# Patient Record
Sex: Male | Born: 1950 | Race: White | Hispanic: No | Marital: Married | Smoking: Current every day smoker
Health system: Southern US, Community
[De-identification: ages and names within clinical notes are randomized; demographics above are authoritative.]

## PROBLEM LIST (undated history)

## (undated) DIAGNOSIS — E119 Type 2 diabetes mellitus without complications: Secondary | ICD-10-CM

## (undated) DIAGNOSIS — I96 Gangrene, not elsewhere classified: Secondary | ICD-10-CM

## (undated) DIAGNOSIS — J449 Chronic obstructive pulmonary disease, unspecified: Secondary | ICD-10-CM

## (undated) DIAGNOSIS — K219 Gastro-esophageal reflux disease without esophagitis: Secondary | ICD-10-CM

## (undated) DIAGNOSIS — I10 Essential (primary) hypertension: Secondary | ICD-10-CM

## (undated) DIAGNOSIS — E785 Hyperlipidemia, unspecified: Secondary | ICD-10-CM

## (undated) HISTORY — DX: Essential (primary) hypertension: I10

## (undated) HISTORY — DX: Hyperlipidemia, unspecified: E78.5

## (undated) HISTORY — DX: Chronic obstructive pulmonary disease, unspecified: J44.9

## (undated) HISTORY — PX: OTHER SURGICAL HISTORY: SHX169

## (undated) HISTORY — DX: Gastro-esophageal reflux disease without esophagitis: K21.9

## (undated) HISTORY — DX: Type 2 diabetes mellitus without complications: E11.9

---

## 2012-03-22 DIAGNOSIS — E559 Vitamin D deficiency, unspecified: Secondary | ICD-10-CM | POA: Diagnosis not present

## 2012-03-22 DIAGNOSIS — K219 Gastro-esophageal reflux disease without esophagitis: Secondary | ICD-10-CM | POA: Diagnosis not present

## 2012-03-22 DIAGNOSIS — R5381 Other malaise: Secondary | ICD-10-CM | POA: Diagnosis not present

## 2012-03-22 DIAGNOSIS — R5383 Other fatigue: Secondary | ICD-10-CM | POA: Diagnosis not present

## 2012-03-22 DIAGNOSIS — Z125 Encounter for screening for malignant neoplasm of prostate: Secondary | ICD-10-CM | POA: Diagnosis not present

## 2012-03-22 DIAGNOSIS — E785 Hyperlipidemia, unspecified: Secondary | ICD-10-CM | POA: Diagnosis not present

## 2012-04-27 DIAGNOSIS — H40039 Anatomical narrow angle, unspecified eye: Secondary | ICD-10-CM | POA: Diagnosis not present

## 2012-04-27 DIAGNOSIS — H43819 Vitreous degeneration, unspecified eye: Secondary | ICD-10-CM | POA: Diagnosis not present

## 2012-05-17 DIAGNOSIS — H25049 Posterior subcapsular polar age-related cataract, unspecified eye: Secondary | ICD-10-CM | POA: Diagnosis not present

## 2012-06-21 DIAGNOSIS — I10 Essential (primary) hypertension: Secondary | ICD-10-CM | POA: Diagnosis not present

## 2012-06-21 DIAGNOSIS — K219 Gastro-esophageal reflux disease without esophagitis: Secondary | ICD-10-CM | POA: Diagnosis not present

## 2012-06-21 DIAGNOSIS — E559 Vitamin D deficiency, unspecified: Secondary | ICD-10-CM | POA: Diagnosis not present

## 2012-06-21 DIAGNOSIS — R5381 Other malaise: Secondary | ICD-10-CM | POA: Diagnosis not present

## 2012-06-21 DIAGNOSIS — E785 Hyperlipidemia, unspecified: Secondary | ICD-10-CM | POA: Diagnosis not present

## 2012-08-31 DIAGNOSIS — Z23 Encounter for immunization: Secondary | ICD-10-CM | POA: Diagnosis not present

## 2012-09-20 DIAGNOSIS — E119 Type 2 diabetes mellitus without complications: Secondary | ICD-10-CM | POA: Diagnosis not present

## 2012-09-20 DIAGNOSIS — K219 Gastro-esophageal reflux disease without esophagitis: Secondary | ICD-10-CM | POA: Diagnosis not present

## 2012-09-20 DIAGNOSIS — E785 Hyperlipidemia, unspecified: Secondary | ICD-10-CM | POA: Diagnosis not present

## 2012-09-20 DIAGNOSIS — E559 Vitamin D deficiency, unspecified: Secondary | ICD-10-CM | POA: Diagnosis not present

## 2012-09-20 DIAGNOSIS — J449 Chronic obstructive pulmonary disease, unspecified: Secondary | ICD-10-CM | POA: Diagnosis not present

## 2012-12-01 IMAGING — CR DG FOOT COMPLETE 3+V*L*
3 series · 3 of 3 positions shown · non-contrast
Comparison: None.

CLINICAL DATA: Fall.  Heel pain.

LEFT FOOT - COMPLETE 3+ VIEW

[view not recorded (1 of 3)]
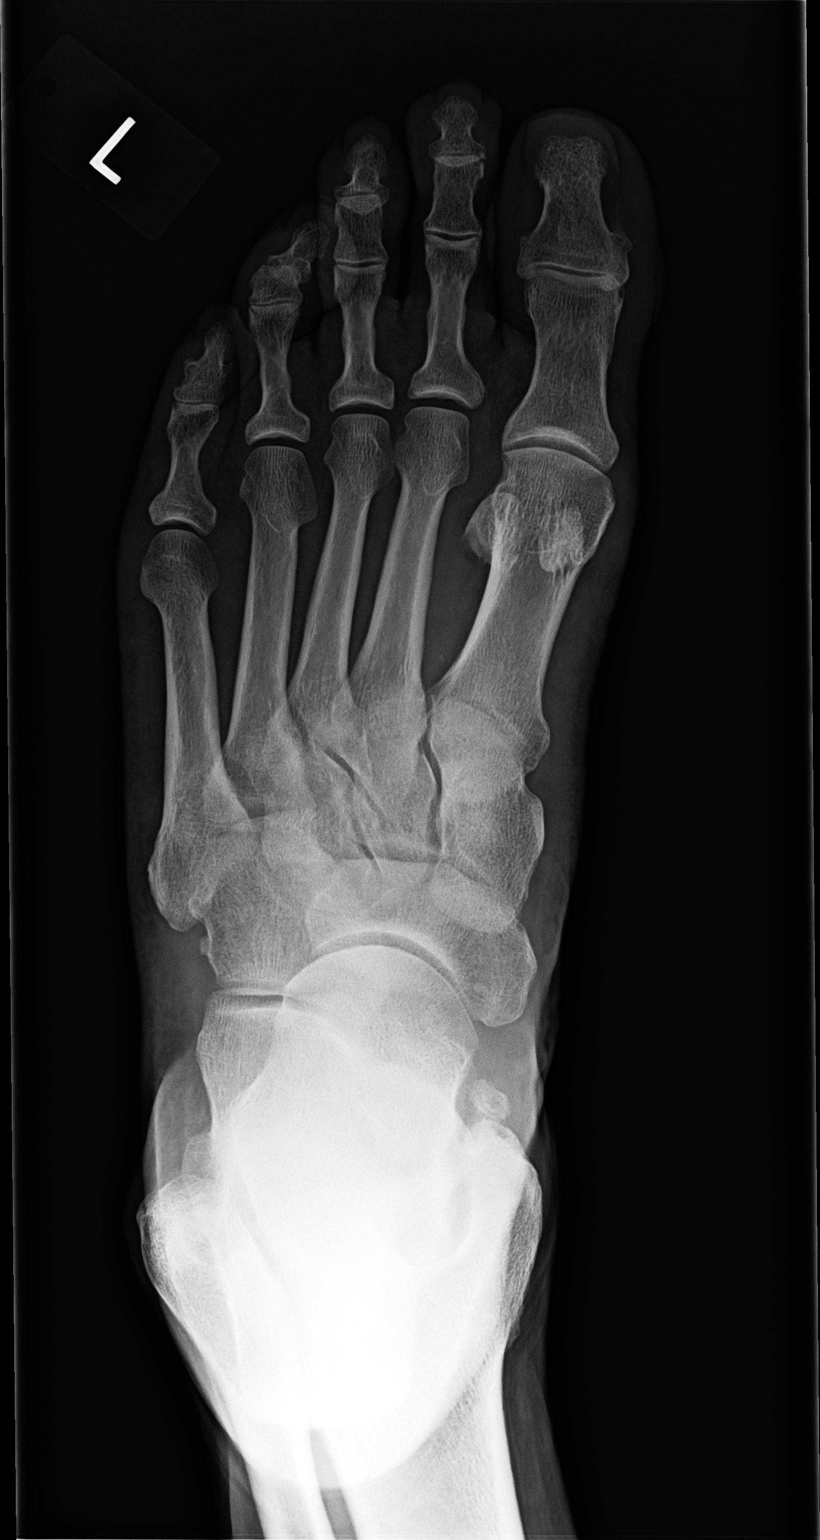

[view not recorded (2 of 3)]
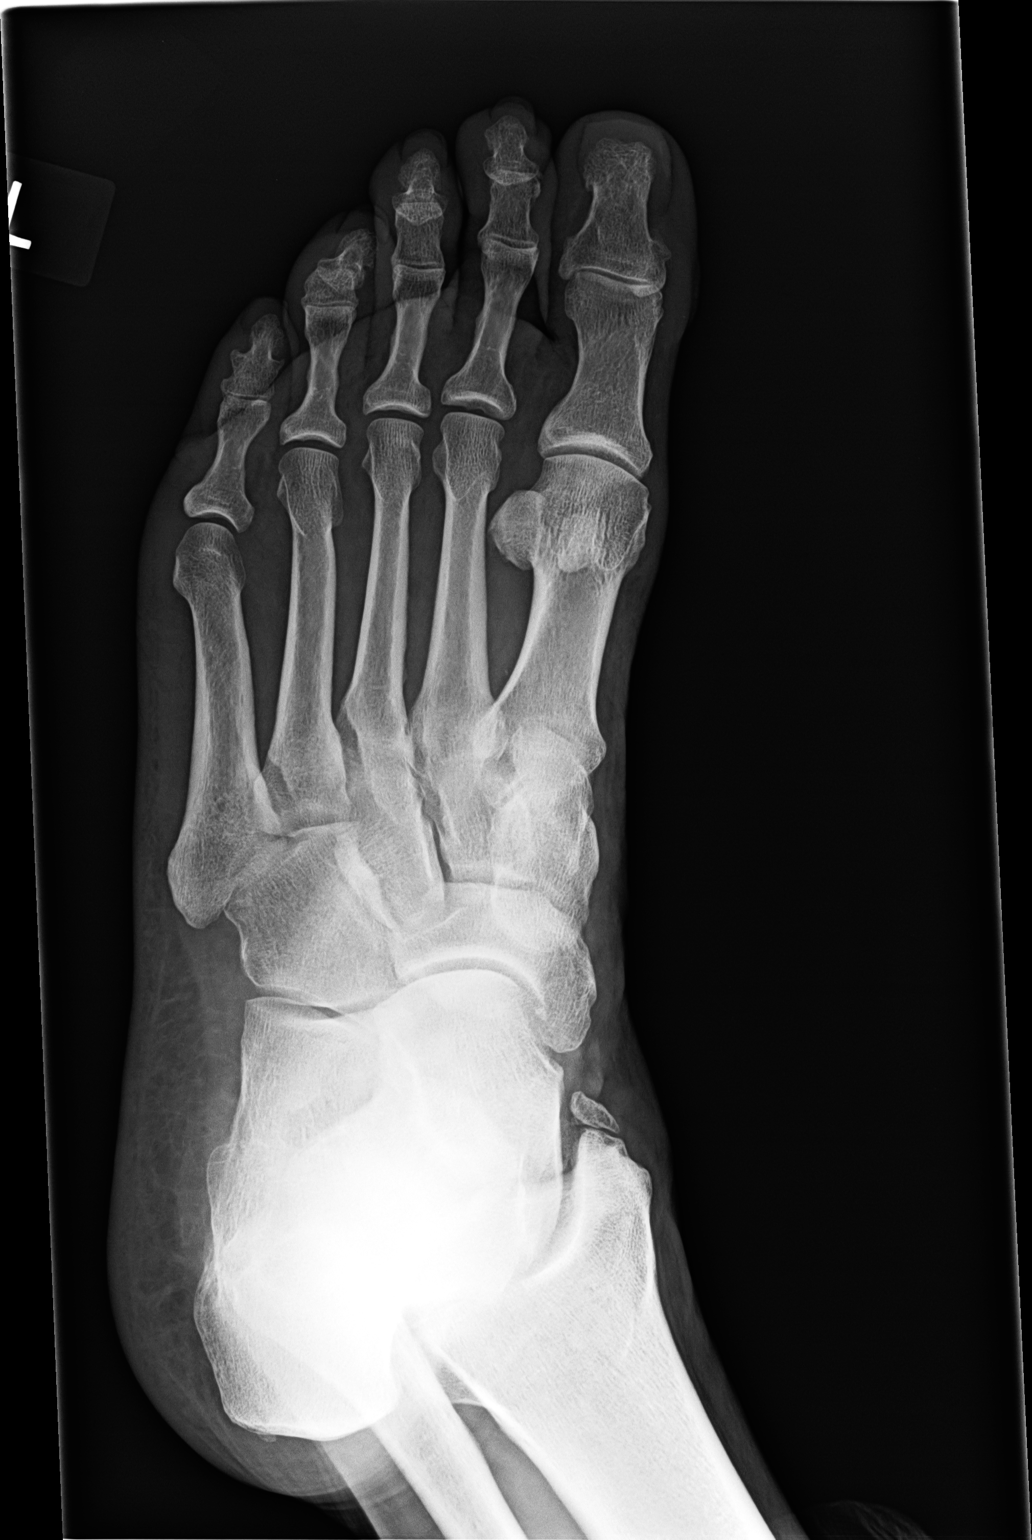

[view not recorded (3 of 3)]
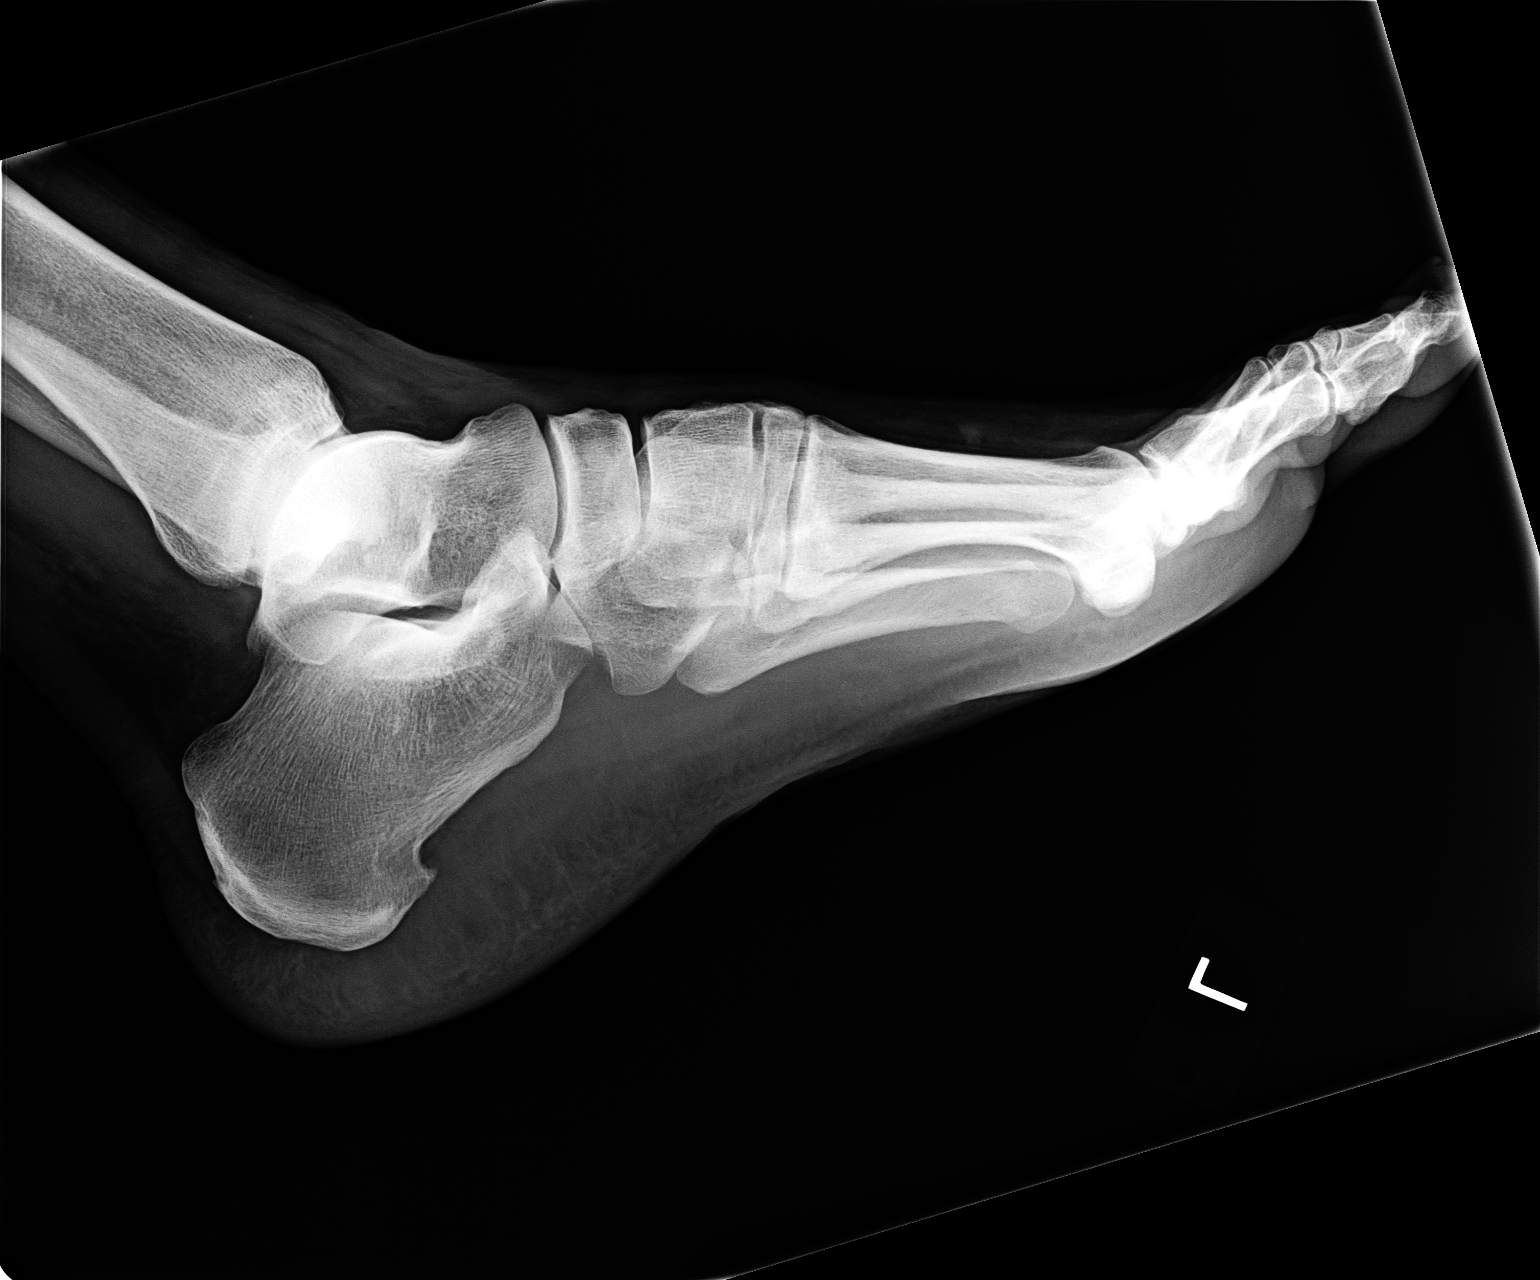

[3 of 3 positions shown; findings below may reference images not displayed]

FINDINGS: There is no evidence for an acute fracture.  Bony
alignment is anatomic. No worrisome lytic or sclerotic osseous
lesion.
IMPRESSION: No acute bony findings.

## 2012-12-31 DIAGNOSIS — M79609 Pain in unspecified limb: Secondary | ICD-10-CM | POA: Diagnosis not present

## 2013-01-11 DIAGNOSIS — I10 Essential (primary) hypertension: Secondary | ICD-10-CM | POA: Diagnosis not present

## 2013-01-11 DIAGNOSIS — R7989 Other specified abnormal findings of blood chemistry: Secondary | ICD-10-CM | POA: Diagnosis not present

## 2013-01-11 DIAGNOSIS — E785 Hyperlipidemia, unspecified: Secondary | ICD-10-CM | POA: Diagnosis not present

## 2013-01-11 DIAGNOSIS — E559 Vitamin D deficiency, unspecified: Secondary | ICD-10-CM | POA: Diagnosis not present

## 2013-01-11 DIAGNOSIS — R0989 Other specified symptoms and signs involving the circulatory and respiratory systems: Secondary | ICD-10-CM | POA: Diagnosis not present

## 2013-01-11 DIAGNOSIS — E119 Type 2 diabetes mellitus without complications: Secondary | ICD-10-CM | POA: Diagnosis not present

## 2013-01-14 ENCOUNTER — Other Ambulatory Visit (HOSPITAL_COMMUNITY): Payer: Self-pay | Admitting: Family Medicine

## 2013-01-14 DIAGNOSIS — R0989 Other specified symptoms and signs involving the circulatory and respiratory systems: Secondary | ICD-10-CM

## 2013-01-15 DIAGNOSIS — M722 Plantar fascial fibromatosis: Secondary | ICD-10-CM | POA: Diagnosis not present

## 2013-01-15 DIAGNOSIS — M79609 Pain in unspecified limb: Secondary | ICD-10-CM | POA: Diagnosis not present

## 2013-01-17 ENCOUNTER — Ambulatory Visit (HOSPITAL_COMMUNITY): Payer: Medicare Other

## 2013-01-24 ENCOUNTER — Ambulatory Visit (HOSPITAL_COMMUNITY)
Admission: RE | Admit: 2013-01-24 | Discharge: 2013-01-24 | Disposition: A | Discharge status: Home / Self Care | Payer: Medicare Other | Source: Ambulatory Visit | Attending: Family Medicine | Admitting: Family Medicine

## 2013-01-24 DIAGNOSIS — I6529 Occlusion and stenosis of unspecified carotid artery: Secondary | ICD-10-CM | POA: Diagnosis not present

## 2013-01-24 DIAGNOSIS — I658 Occlusion and stenosis of other precerebral arteries: Secondary | ICD-10-CM | POA: Diagnosis not present

## 2013-01-24 DIAGNOSIS — I10 Essential (primary) hypertension: Secondary | ICD-10-CM | POA: Insufficient documentation

## 2013-01-24 DIAGNOSIS — R0989 Other specified symptoms and signs involving the circulatory and respiratory systems: Secondary | ICD-10-CM | POA: Insufficient documentation

## 2013-01-24 IMAGING — US US CAROTID DUPLEX BILAT
1 series · 13 of 24 positions shown · non-contrast
Comparison: None.

CLINICAL DATA: Right-sided carotid bruit, history of hypertension

BILATERAL CAROTID DUPLEX ULTRASOUND
TECHNIQUE: Gray scale imaging, color Doppler and duplex ultrasound
was performed of bilateral carotid and vertebral arteries in the
neck.

[Series 1: us carotid duplex bilat · 0.05mm/px · 13 of 74 slices shown]
[im 1/74]
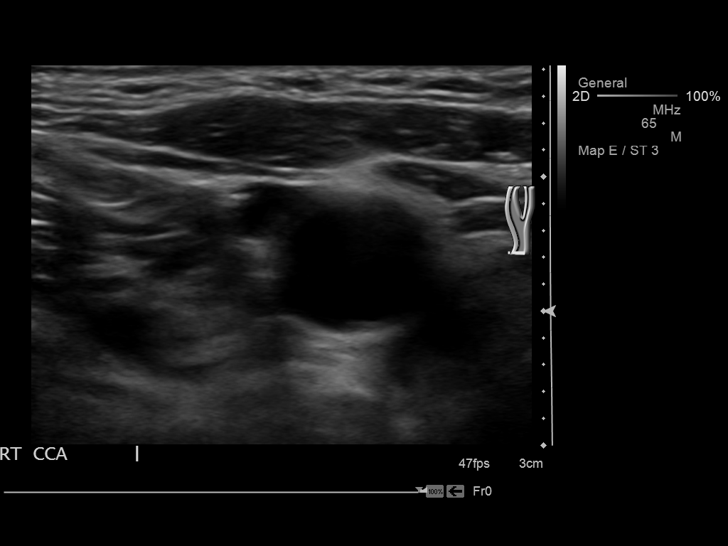
[im 7/74]
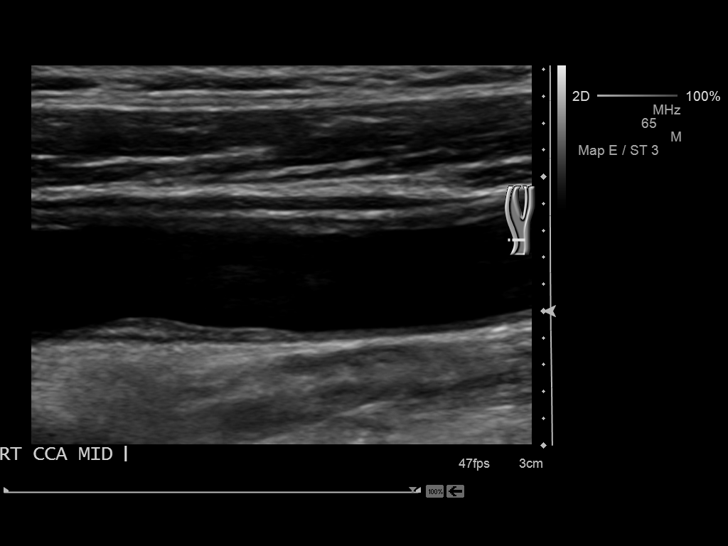
[im 13/74]
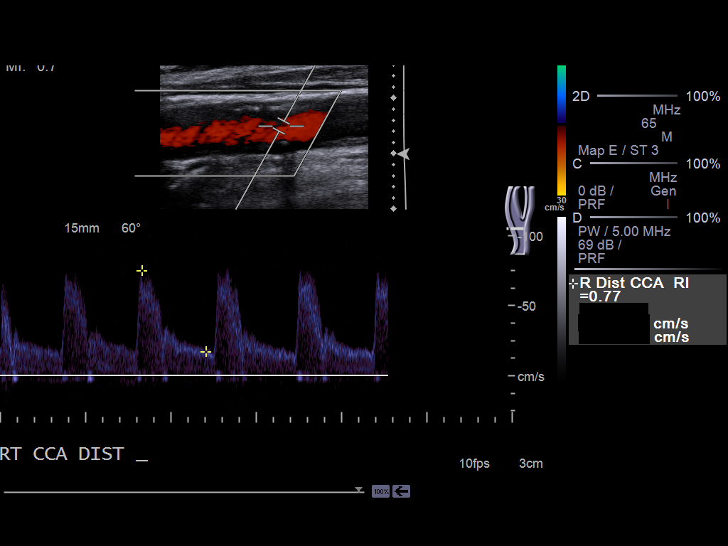
[im 20/74]
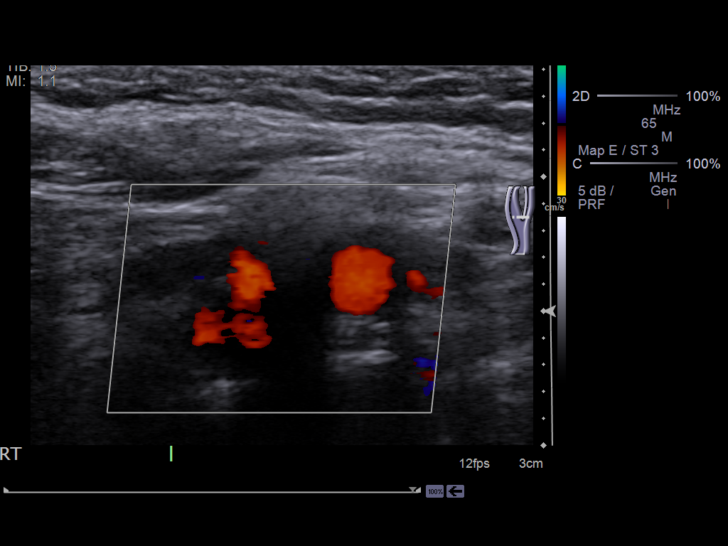
[im 26/74]
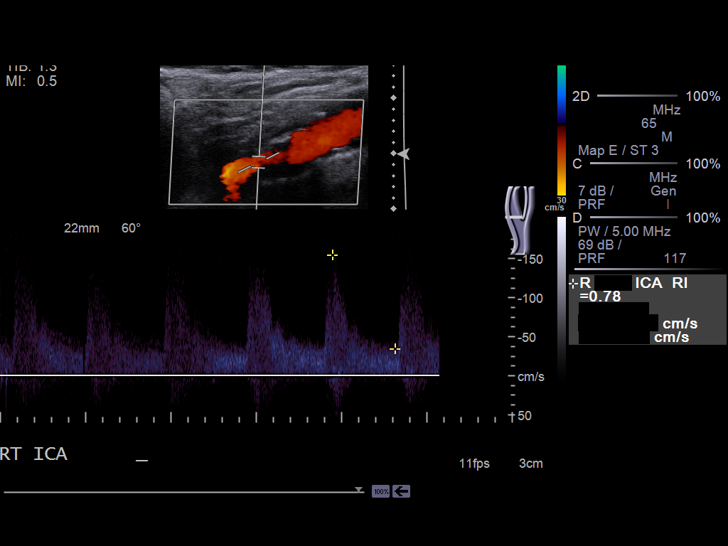
[im 32/74]
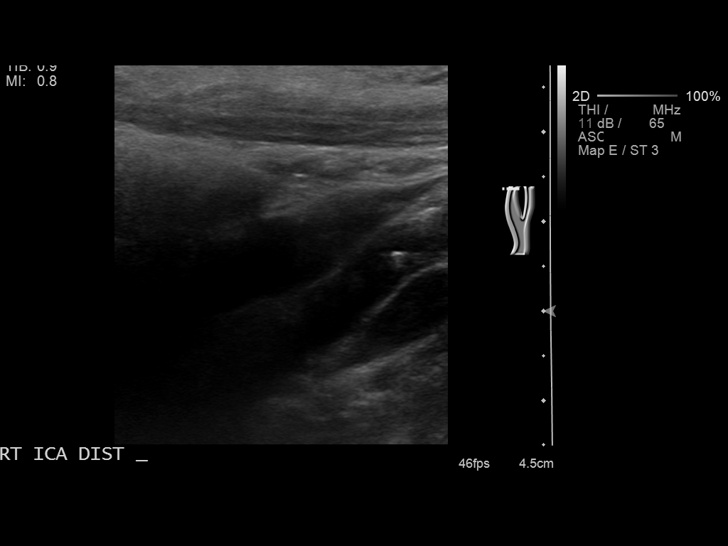
[im 39/74]
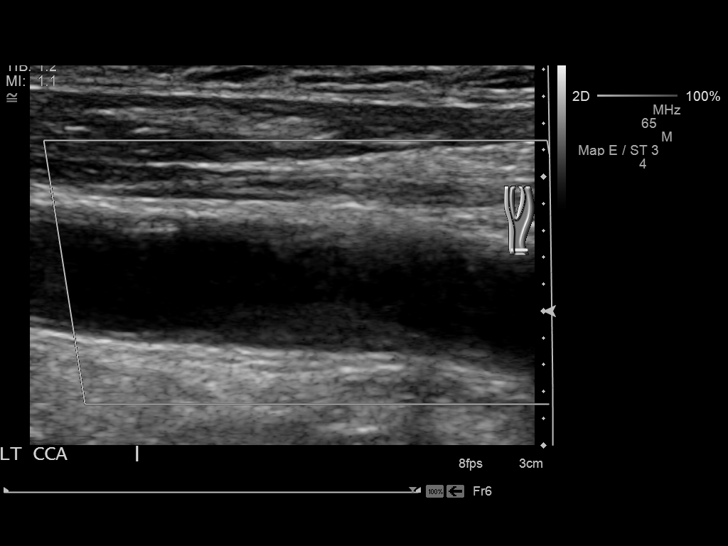
[im 42/74]
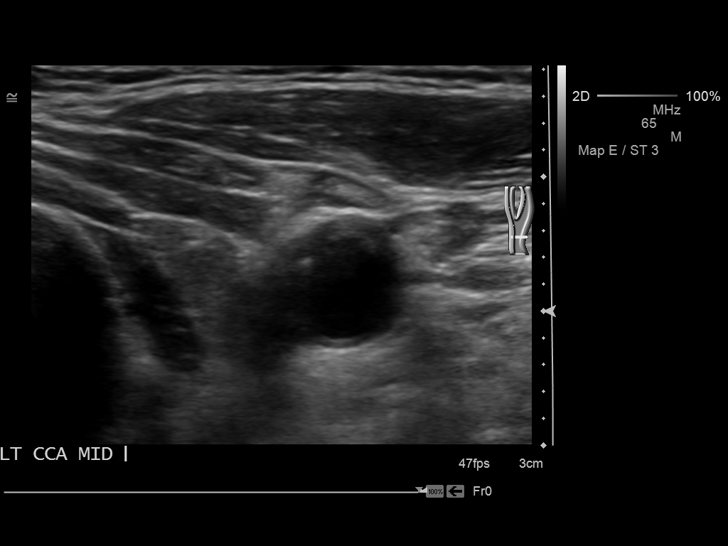
[im 48/74]
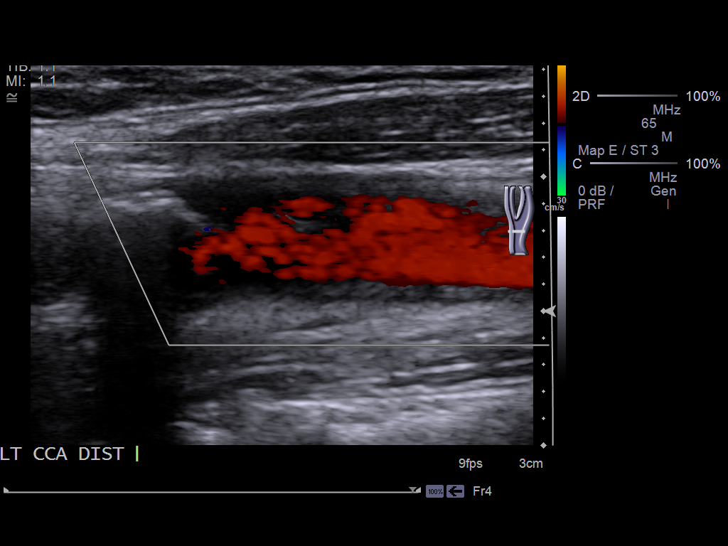
[im 54/74]
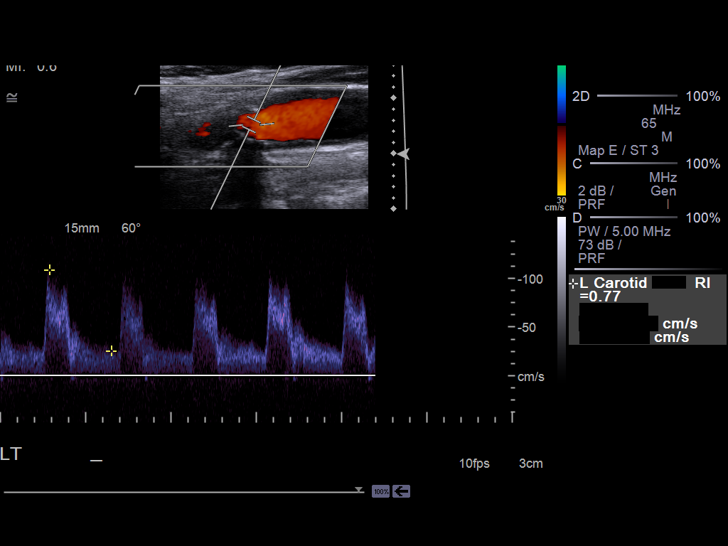
[im 61/74]
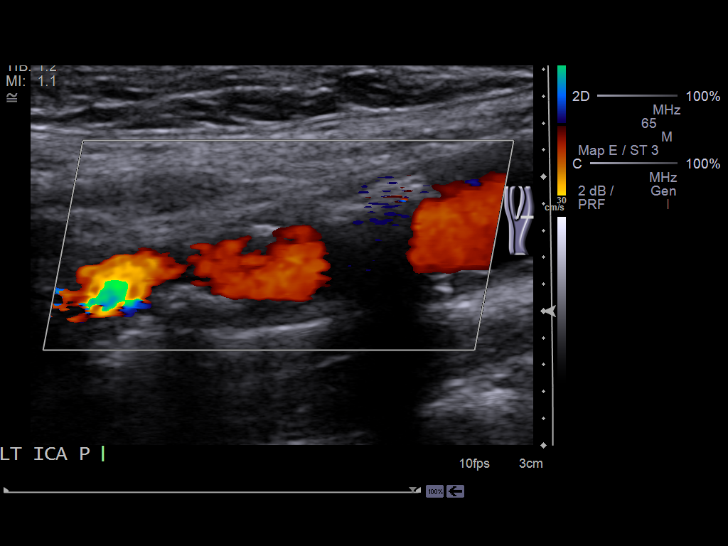
[im 67/74]
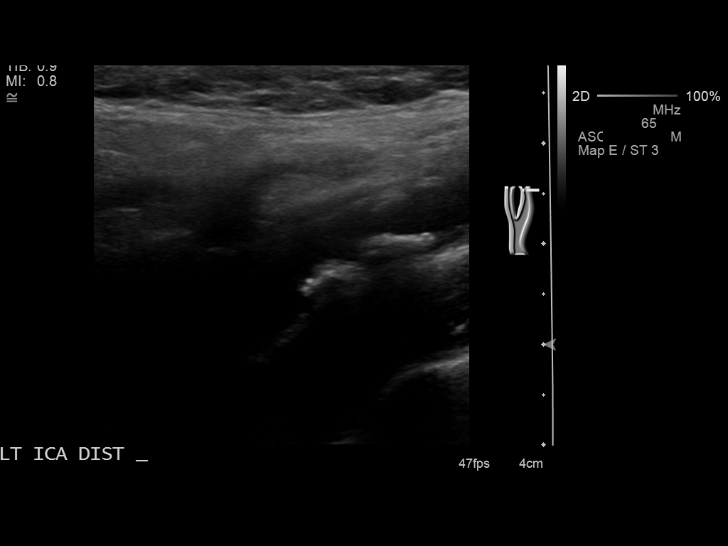
[im 74/74]
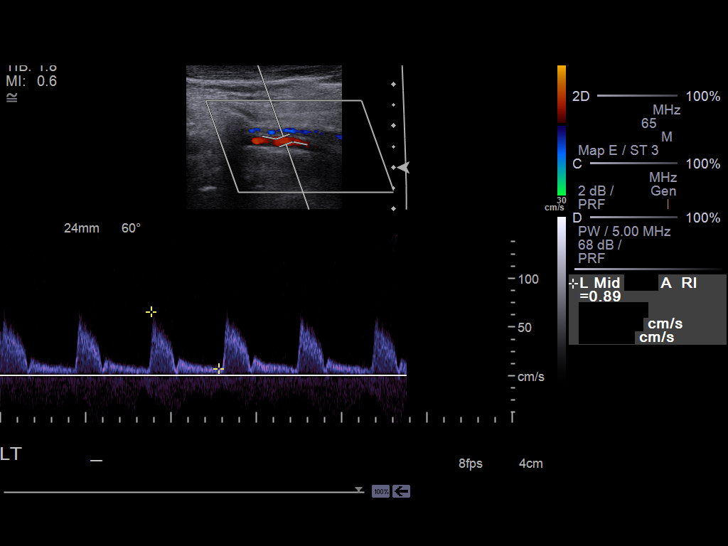

[13 of 24 positions shown; findings below may reference images not displayed]

Criteria:  Quantification of carotid stenosis is based on velocity
parameters that correlate the residual internal carotid diameter
with NASCET-based stenosis levels, using the diameter of the distal
internal carotid lumen as the denominator for stenosis measurement.

The following velocity measurements were obtained:

                 PEAK SYSTOLIC/END DIASTOLIC
RIGHT
ICA:                        139/34cm/sec
CCA:                        79/16cm/sec
SYSTOLIC ICA/CCA RATIO:
DIASTOLIC ICA/CCA RATIO:
ECA:                        137cm/sec

LEFT
ICA:                        124/25cm/sec
CCA:                        132/13cm/sec
SYSTOLIC ICA/CCA RATIO:
DIASTOLIC ICA/CCA RATIO:    [DATE]
ECA:                        100cm/sec
FINDINGS: RIGHT CAROTID ARTERY: There is eccentric mixed echogenic partially
shadowing plaque within the distal aspect the right common carotid
artery (image 12). There is a minimal amount of eccentric largely
hypoechoic plaque within the left carotid bulb (image 18) with
eccentric mixed echogenic plaque involving the origin and proximal
aspect of the right internal carotid artery (image 25) which
results in elevated peak systolic velocity within the proximal and
mid aspects aspect of the right internal carotid artery (measuring
155-159 cm/sec proximally, images 27 and 28 respectively; and 135-
144 cm/sec within the mid aspect, images 32 and 31 respectively))

RIGHT VERTEBRAL ARTERY:  Antegrade flow

LEFT CAROTID ARTERY: There is eccentric hypoechoic plaque involving
the proximal aspect of the left common carotid artery (image 40)
and multiple areas of eccentric largely echogenic plaque within the
mid aspects of the left common carotid artery (image 44).  There is
eccentric echogenic shadowing plaque within the left carotid bulb
(images 51 and 54) extending to involve the origin and proximal
aspects of the left internal carotid artery (image 62) resulting in
borderline elevated peak systolic velocities within the distal
aspect of the left internal carotid artery measuring 124 cm/sec
(image 71).

LEFT VERTEBRAL ARTERY:  Antegrade flow
IMPRESSION: 1.  Right-sided atherosclerotic plaque results in elevated peak
systolic velocities within the right internal carotid artery
compatible with the 50-69% luminal narrowing range.
2.  Left-sided atherosclerotic plaque results in a borderline
elevated peak systolic velocities within the left internal carotid
artery which approaches the 50% luminal narrowing range, though
note there is significant disease within the left common carotid
artery.  Further evaluation with CTA may be performed as clinically
indicated.

## 2013-02-05 DIAGNOSIS — M722 Plantar fascial fibromatosis: Secondary | ICD-10-CM | POA: Diagnosis not present

## 2013-02-19 ENCOUNTER — Other Ambulatory Visit: Payer: Self-pay

## 2013-02-19 MED ORDER — ALBUTEROL SULFATE HFA 108 (90 BASE) MCG/ACT IN AERS: INHALATION_SPRAY | RESPIRATORY_TRACT | Status: DC

## 2013-02-19 MED ORDER — PRAVASTATIN SODIUM 40 MG PO TABS: 40.0000 mg | ORAL_TABLET | Freq: Every day | ORAL | Status: DC

## 2013-02-22 ENCOUNTER — Telehealth: Payer: Self-pay | Admitting: Family Medicine

## 2013-02-22 NOTE — Telephone Encounter (Signed)
APPT MADE

## 2013-02-27 ENCOUNTER — Encounter: Payer: Self-pay | Admitting: Nurse Practitioner

## 2013-02-27 ENCOUNTER — Ambulatory Visit (INDEPENDENT_AMBULATORY_CARE_PROVIDER_SITE_OTHER): Payer: Medicare Other | Admitting: Nurse Practitioner

## 2013-02-27 VITALS — BP 137/71 | HR 65 | Temp 98.0°F | Ht 74.0 in | Wt 221.0 lb

## 2013-02-27 DIAGNOSIS — E1143 Type 2 diabetes mellitus with diabetic autonomic (poly)neuropathy: Secondary | ICD-10-CM

## 2013-02-27 DIAGNOSIS — E109 Type 1 diabetes mellitus without complications: Secondary | ICD-10-CM | POA: Insufficient documentation

## 2013-02-27 DIAGNOSIS — G909 Disorder of the autonomic nervous system, unspecified: Secondary | ICD-10-CM | POA: Diagnosis not present

## 2013-02-27 DIAGNOSIS — E1149 Type 2 diabetes mellitus with other diabetic neurological complication: Secondary | ICD-10-CM

## 2013-02-27 NOTE — Progress Notes (Signed)
  Subjective:    [name], male    DOB: 07-26-1951, 62 y.o.   MRN: [id number]  Diabetes He presents for his follow-up diabetic visit. He has type 2 diabetes mellitus. No MedicAlert identification noted. His disease course has been stable. Pertinent negatives for hypoglycemia include no confusion, dizziness, headaches or seizures. Associated symptoms include visual change (Right eye has cataract). Pertinent negatives for diabetes include no blurred vision, no chest pain, no foot paresthesias and no weakness. Pertinent negatives for hypoglycemia complications include no blackouts and no required assistance. Symptoms are stable. Pertinent negatives for diabetic complications include no CVA, heart disease or peripheral neuropathy. Risk factors for coronary artery disease include diabetes mellitus, dyslipidemia, male sex and hypertension. Current diabetic treatment includes diet and oral agent (monotherapy). He is compliant with treatment all of the time. His weight is stable. He is following a diabetic diet. He has not had a previous visit with a dietician. He participates in exercise daily. His home blood glucose trend is fluctuating minimally. His breakfast blood glucose is taken between 8-9 am. His breakfast blood glucose range is generally 90-110 mg/dl. An ACE inhibitor/angiotensin II receptor blocker is not being taken. He sees a podiatrist (02/26/13).Eye exam is current (June 2013).      Review of Systems  Constitutional: Negative.   HENT: Negative.   Eyes: Negative.  Negative for blurred vision.  Cardiovascular: Negative for chest pain.  Musculoskeletal:       Bilaterally foot pain   Neurological: Negative for dizziness, seizures, weakness and headaches.  Psychiatric/Behavioral: Negative for confusion.  All other systems reviewed and are negative.       Objective:   Physical Exam  Constitutional: He is oriented to person, place, and time. He appears well-developed and  well-nourished.  Cardiovascular: Normal rate, regular rhythm and normal heart sounds.   No murmur heard. Pulmonary/Chest: Effort normal and breath sounds normal.  Neurological: He is alert and oriented to person, place, and time.  +3/3 monofilament bilaterally   Skin:  Callus formation on bilaterally heels and [place]   BP 137/71  Pulse 65  Temp(Src) 98 F (36.7 C) (Oral)  Ht 6\' 2"  (1.88 m)  Wt 221 lb (100.245 kg)  BMI 28.36 kg/m2        Assessment & Plan:  Diabetic Neuropathy  Order sent for diabetic shoes to [facility]  Follow-up in 2 months for routine follow-up Mary-Margaret Daphine Deutscher, FNP

## 2013-02-27 NOTE — Patient Instructions (Signed)
Diabetic Neuropathy  Diabetic neuropathy is a common complication caused by diabetes. Neuropathy is a term that means nerve disease or damage. If your diabetes is uncontrolled and you have high blood glucose (sugar) levels, over time, this can lead to damage to nerves throughout your body. There are three types of diabetic neuropathy:   · Peripheral.  · Autonomic.  · Focal.  PERIPHERAL NEUROPATHY  Peripheral neuropathy is the most common form of diabetic neuropathy. It causes damage to the nerves of the feet and legs and eventually the hands and arms.   SYMPTOMS   Peripheral neuropathy occurs slowly over time. The peripheral nerves sense touch, hot and cold, and pain. When these nerves no longer work:   · Your feet become numb.  · You can no longer feel pressure or pain in your feet.  · You may have burning, stabbing or aching pain.  This can lead to:  · Thick calluses over pressure areas.  · Pressure sores.  · Ulcers. Ulcers can become infected with germs (bacteria) and can even lead to infection in the bones of the feet.  DIAGNOSIS   The diagnosis of diabetic neuropathy is difficult at best. Sensory function testing can be done with:  · Light touch using a monofilament.  · Vibration with tuning fork.  · Sharp sensation with pin prick  Other tests that can help diagnose neuropathy are:  · Nerve Conduction Velocities (NCV). This checks the transmission of electrical current through a nerve.  · Electromyography (EMG). This shows how muscles respond to electrical signals transmitted by nearby nerves.  · Quantitative sensory testing, which is used to assess how your nerves respond to vibration and changes in temperature.  AUTONOMIC NEUROPATHY  The autonomic nervous system controls functions that you do not think about. Examples would be:   · Heart beat.  · Regulation of body temperature.  · Blood pressure.  · Urination.  · Digestion.  · Sweating.  · Sexual function.  SYMPTOMS   The symptoms of autonomic neuropathy vary  depending on which nerves are affected.   · There can be problems with digestion such as:  · Feeling sick to your stomach (nausea).  · Vomiting.  · Bloating.  · Constipation.  · Diarrhea.  · Abdominal pain.  · Difficulty with urination may occur because of the inability to sense when your bladder is full. You may have urine leakage (incontinence) or inability to empty your bladder completely (retention).  · Palpitations or a feeling of an abnormal heart beat.  · Blood pressure drops on arising (orthostatic hypotension). This can happen when you first sit up or stand up. It causes you to feel:  · Dizzy.  · Weak.  · Faint.  · Sexual functioning:  · In men, inability to attain and maintain an erection.  · In women, vaginal dryness and problems with decreased sexual desire and arousal.  DIAGNOSIS   Diagnosis is often based on reported symptoms. Tell your medical caregiver if you experience:   · Dizziness.  · Constipation.  · Diarrhea.  · Inappropriate urination or inability to urinate.  · Inability to get or maintain an erection.  Tests that may be done include:  · An EKG or Holter Monitor. These are tests that can help show problems with the heart rate or heart rhythm.  · X-rays can be used to find if there are problems with your ability to properly empty food from your stomach into the small intestine after eating.  FOCAL   NEUROPATHY  Focal neuropathy affects just one nerve tract and occurs suddenly. However, it usually improves by itself over time. It does not cause long term damage, and treatments are usually needed only until the problem improves.  SYMPTOMS   Examples include:   · Abnormal eye movements or abnormal alignment of both eyes.  · Weakness in the wrist.  · Foot drop, which results in inability to lift the foot properly. This causes abnormal walking or foot movement.  DIAGNOSIS   Diagnosis is made based on your symptoms and what your caregiver finds on your exam. Other tests that may be done  include:  · Nerve Conduction Velocities (NCV). This checks the transmission of electrical current through a nerve.  · Electromyography (EMG). This shows how muscles respond to electrical signals transmitted by nearby nerves.  · Quantitative sensory testing, which is used to assess how your nerves respond to vibration and changes in temperature.  TREATMENT  Once nerve damage occurs it cannot be reversed. The goal of treatment is to keep the disease from getting worse. If it gets worse, it will affect more nerve fibers. Controlling your blood (sugar) is the key. You will need to keep your blood glucose and A1c at the target range prescribed by your caregiver. Things that will help control blood glucose levels include:  · Blood glucose monitoring.  · Meal planning.  · Physical activity.  · Diabetes medication.  Over time, maintaining lower blood glucose levels helps lessen symptoms.  Sometimes, prescription pain medicine is needed. Focal neuropathy can be painful and unpredictable and occurs most often in older adults with diabetes.   SEEK MEDICAL CARE IF:   · You develop peripheral nerve symptoms such as burning, numbness, or pain in your feet, legs or hands.  · You develop autonomic nerve symptoms such as:  · Dizziness.  · Abnormal urinary control.  · Inability to get an erection.  · You develop focal nerve symptoms such as sudden abnormal eye movements or sudden foot drop.  Document Released: 01/16/2002 Document Revised: 01/30/2012 Document Reviewed: 04/17/2009  [employer]® Patient Information ©2013 [employer].

## 2013-03-11 ENCOUNTER — Telehealth: Payer: Self-pay | Admitting: Family Medicine

## 2013-03-15 ENCOUNTER — Encounter: Payer: Self-pay | Admitting: Nurse Practitioner

## 2013-03-15 ENCOUNTER — Ambulatory Visit (INDEPENDENT_AMBULATORY_CARE_PROVIDER_SITE_OTHER): Payer: Medicare Other | Admitting: Nurse Practitioner

## 2013-03-15 ENCOUNTER — Telehealth: Payer: Self-pay | Admitting: Family Medicine

## 2013-03-15 VITALS — BP 136/77 | HR 97 | Temp 97.2°F | Ht 74.0 in | Wt 219.5 lb

## 2013-03-15 DIAGNOSIS — E1140 Type 2 diabetes mellitus with diabetic neuropathy, unspecified: Secondary | ICD-10-CM | POA: Insufficient documentation

## 2013-03-15 DIAGNOSIS — E1142 Type 2 diabetes mellitus with diabetic polyneuropathy: Secondary | ICD-10-CM

## 2013-03-15 DIAGNOSIS — E1149 Type 2 diabetes mellitus with other diabetic neurological complication: Secondary | ICD-10-CM

## 2013-03-15 MED ORDER — GLUCOSE BLOOD VI STRP: ORAL_STRIP | Status: DC

## 2013-03-15 NOTE — Telephone Encounter (Signed)
Need to change his RX to diabetic shoes not strips they called and told him it was ready at [facility] in [place]

## 2013-03-15 NOTE — Telephone Encounter (Signed)
I cant figure out how to order diabetic shoes in epic. Can you help

## 2013-03-15 NOTE — Progress Notes (Addendum)
  Subjective:    [name], male    DOB: 18-Mar-1951, 62 y.o.   MRN: [id number]  HPI Patient here for diabetic foot exam. Needs Diabetic shoes but needs documentation to allow medicare to approve. Patient has pain in bil feet when walking a lot.    Review of Systems  All other systems reviewed and are negative.       Objective:   Physical Exam  Constitutional: He appears well-developed and well-nourished.  Cardiovascular: Normal rate, regular rhythm and normal heart sounds.   Pulses:      Posterior tibial pulses are 2+ on the right side, and 2+ on the left side.  Pulmonary/Chest: Effort normal and breath sounds normal.  Neurological:  Positive 3/4 monofilament bil feet  Skin:  Callus bil feet and between toes.           Assessment & Plan:  Diabetic neuropathy bil feet with callus formation  RX for diabetic shoes  RTO in 3 months for routine F?U  Mary-Margaret Daphine Deutscher, FNP

## 2013-03-18 ENCOUNTER — Other Ambulatory Visit: Payer: Self-pay | Admitting: *Deleted

## 2013-03-18 MED ORDER — ALBUTEROL SULFATE HFA 108 (90 BASE) MCG/ACT IN AERS: INHALATION_SPRAY | RESPIRATORY_TRACT | Status: DC

## 2013-03-18 MED ORDER — METFORMIN HCL 1000 MG PO TABS: 1000.0000 mg | ORAL_TABLET | Freq: Two times a day (BID) | ORAL | Status: DC

## 2013-04-02 DIAGNOSIS — M722 Plantar fascial fibromatosis: Secondary | ICD-10-CM | POA: Diagnosis not present

## 2013-04-02 DIAGNOSIS — G44209 Tension-type headache, unspecified, not intractable: Secondary | ICD-10-CM | POA: Diagnosis not present

## 2013-04-02 DIAGNOSIS — E119 Type 2 diabetes mellitus without complications: Secondary | ICD-10-CM | POA: Diagnosis not present

## 2013-04-02 DIAGNOSIS — M79609 Pain in unspecified limb: Secondary | ICD-10-CM | POA: Diagnosis not present

## 2013-04-11 ENCOUNTER — Other Ambulatory Visit: Payer: Self-pay | Admitting: Family Medicine

## 2013-04-11 ENCOUNTER — Ambulatory Visit (INDEPENDENT_AMBULATORY_CARE_PROVIDER_SITE_OTHER): Payer: Medicare Other | Admitting: Family Medicine

## 2013-04-11 ENCOUNTER — Encounter: Payer: Self-pay | Admitting: Family Medicine

## 2013-04-11 VITALS — BP 145/81 | HR 77 | Temp 99.9°F | Ht 74.0 in | Wt 218.0 lb

## 2013-04-11 DIAGNOSIS — I779 Disorder of arteries and arterioles, unspecified: Secondary | ICD-10-CM

## 2013-04-11 DIAGNOSIS — E785 Hyperlipidemia, unspecified: Secondary | ICD-10-CM

## 2013-04-11 DIAGNOSIS — E119 Type 2 diabetes mellitus without complications: Secondary | ICD-10-CM | POA: Diagnosis not present

## 2013-04-11 DIAGNOSIS — E1169 Type 2 diabetes mellitus with other specified complication: Secondary | ICD-10-CM | POA: Insufficient documentation

## 2013-04-11 DIAGNOSIS — E1159 Type 2 diabetes mellitus with other circulatory complications: Secondary | ICD-10-CM | POA: Insufficient documentation

## 2013-04-11 DIAGNOSIS — E1142 Type 2 diabetes mellitus with diabetic polyneuropathy: Secondary | ICD-10-CM

## 2013-04-11 DIAGNOSIS — J441 Chronic obstructive pulmonary disease with (acute) exacerbation: Secondary | ICD-10-CM | POA: Diagnosis not present

## 2013-04-11 DIAGNOSIS — I10 Essential (primary) hypertension: Secondary | ICD-10-CM | POA: Diagnosis not present

## 2013-04-11 DIAGNOSIS — E1140 Type 2 diabetes mellitus with diabetic neuropathy, unspecified: Secondary | ICD-10-CM

## 2013-04-11 DIAGNOSIS — E1149 Type 2 diabetes mellitus with other diabetic neurological complication: Secondary | ICD-10-CM

## 2013-04-11 NOTE — Patient Instructions (Addendum)
Fall precautions discussed. Continue current medications and therapeutic lifestyle changes. Monitor blood sugars closely Check feet regularly Bring information regarding the physician and the date of the last colonoscopy We will determine and look at the cost of a shingle shot and a tetanus shot

## 2013-04-11 NOTE — Progress Notes (Signed)
Subjective:    [name], male    DOB: Mar 01, 1951, 62 y.o.   MRN: [id number]  HPI This patient presents for recheck of multiple medical problems. No one accompanies the patient today.  Patient Active Problem List   Diagnosis Date Noted  . Diabetic neuropathy, type II diabetes mellitus 03/15/2013  . Diabetes mellitus type 1 with goal HbA1C below 7.5 02/27/2013   see other diagnoses that are on his problem list   In addition, see review of systems  The allergies, current medications, past medical history, surgical history, family and social history are reviewed.  Immunizations reviewed.  Health maintenance reviewed.  The following items are outstanding: Zostavax, colonoscopy, tetanus. As of note the patient said he had a colonoscopy about 4 years ago at the hospital in [place].      Review of Systems  Constitutional: Negative.   HENT: Negative.   Eyes: Positive for visual disturbance (catartact to be removed).  Respiratory: Negative.   Cardiovascular: Negative.   Gastrointestinal: Negative.   Endocrine: Negative.   Genitourinary: Negative.   Musculoskeletal: Negative.   Skin: Negative.   Allergic/Immunologic: Negative.   Neurological: Negative.   Hematological: Negative.   Psychiatric/Behavioral: Negative.        Objective:   Physical Exam BP 145/81  Pulse 77  Temp(Src) 99.9 F (37.7 C) (Oral)  Ht 6\' 2"  (1.88 m)  Wt 218 lb (98.884 kg)  BMI 27.98 kg/m2  The patient appeared well nourished and normally developed, alert and oriented to time and place. Speech, behavior and judgement appear normal. His memory was somewhat deficient about names of previous physicians and locations where a previous test had been done. Vital signs as documented.  Head exam is unremarkable. No scleral icterus or pallor noted. He does have bilateral ear cerumen. He is edentulous. Neck is without jugular venous distension,or thyromegally. He does have bilateral carotid  bruits. Carotid upstrokes are brisk bilaterally. No cervical adenopathy. Lungs are clear anteriorly and posteriorly to auscultation. Normal respiratory effort. Cardiac exam reveals regular rate and rhythm at 72 per minute. First and second heart sounds normal.  No murmurs, rubs or gallops.  Abdominal exam reveals normal bowl sounds, no masses, no organomegaly and no aortic enlargement. No inguinal adenopathy. Extremities are nonedematous and both femoral and pedal pulses are normal. Callus on right lateral foot plantar surface. And tenderness plantar surface left heel. Skin without pallor or jaundice.  Warm and dry, without rash. Neurologic exam reveals normal deep tendon reflexes and normal sensation.          Assessment & Plan:  1. hyperlipidemia - COMPLETE METABOLIC PANEL WITH GFR; Standing - NMR Lipoprofile with Lipids; Standing - POCT glycosylated hemoglobin (Hb A1C); Standing - CBC with Differential; Standing  2. HTN (hypertension) - COMPLETE METABOLIC PANEL WITH GFR; Standing - NMR Lipoprofile with Lipids; Standing - POCT glycosylated hemoglobin (Hb A1C); Standing - CBC with Differential; Standing  3. COPD exacerbation - COMPLETE METABOLIC PANEL WITH GFR; Standing - NMR Lipoprofile with Lipids; Standing - POCT glycosylated hemoglobin (Hb A1C); Standing - CBC with Differential; Standing  4. Diabetes Type 2 - COMPLETE METABOLIC PANEL WITH GFR; Standing - NMR Lipoprofile with Lipids; Standing - POCT glycosylated hemoglobin (Hb A1C); Standing - CBC with Differential; Standing  5. Bilateral carotid artery disease He will be due another carotid ultrasound in February or March of 2015  6. Diabetic neuropathy, type II diabetes mellitus A prescription for diabetic shoes  because of the diabetic peripheral neuropathy and  callus formation will be given today  Patient Instructions  Fall precautions discussed. Continue current medications and therapeutic lifestyle  changes. Monitor blood sugars closely Check feet regularly Bring information regarding the physician and the date of the last colonoscopy We will determine and look at the cost of a shingle shot and a tetanus shot

## 2013-04-12 ENCOUNTER — Other Ambulatory Visit (INDEPENDENT_AMBULATORY_CARE_PROVIDER_SITE_OTHER): Payer: Medicare Other

## 2013-04-12 ENCOUNTER — Other Ambulatory Visit: Payer: Self-pay | Admitting: *Deleted

## 2013-04-12 DIAGNOSIS — E785 Hyperlipidemia, unspecified: Secondary | ICD-10-CM

## 2013-04-12 DIAGNOSIS — I10 Essential (primary) hypertension: Secondary | ICD-10-CM | POA: Diagnosis not present

## 2013-04-12 DIAGNOSIS — R5381 Other malaise: Secondary | ICD-10-CM

## 2013-04-12 DIAGNOSIS — J441 Chronic obstructive pulmonary disease with (acute) exacerbation: Secondary | ICD-10-CM

## 2013-04-12 DIAGNOSIS — E119 Type 2 diabetes mellitus without complications: Secondary | ICD-10-CM | POA: Diagnosis not present

## 2013-04-12 DIAGNOSIS — R5383 Other fatigue: Secondary | ICD-10-CM | POA: Diagnosis not present

## 2013-04-12 LAB — POCT CBC
Granulocyte percent: 75.2 %G (ref 37–80)
Lymph, poc: 1.7 (ref 0.6–3.4)
MCHC: 34.7 g/dL (ref 31.8–35.4)
POC Granulocyte: 6.2 (ref 2–6.9)
Platelet Count, POC: 297 10*3/uL (ref 142–424)
RDW, POC: 13.8 %
WBC: 8.3 10*3/uL (ref 4.6–10.2)

## 2013-04-12 LAB — POCT GLYCOSYLATED HEMOGLOBIN (HGB A1C): Hemoglobin A1C: 6.7

## 2013-04-12 MED ORDER — METFORMIN HCL 1000 MG PO TABS: 1000.0000 mg | ORAL_TABLET | Freq: Two times a day (BID) | ORAL | Status: DC

## 2013-04-12 MED ORDER — MELOXICAM 7.5 MG PO TABS: 7.5000 mg | ORAL_TABLET | Freq: Two times a day (BID) | ORAL | Status: DC

## 2013-04-12 MED ORDER — MONTELUKAST SODIUM 10 MG PO TABS: 10.0000 mg | ORAL_TABLET | Freq: Every day | ORAL | Status: DC

## 2013-04-12 MED ORDER — ALBUTEROL SULFATE HFA 108 (90 BASE) MCG/ACT IN AERS: INHALATION_SPRAY | RESPIRATORY_TRACT | Status: DC

## 2013-04-12 MED ORDER — AMLODIPINE BESYLATE 10 MG PO TABS: 10.0000 mg | ORAL_TABLET | Freq: Every day | ORAL | Status: DC

## 2013-04-12 MED ORDER — RANITIDINE HCL 150 MG PO TABS: 150.0000 mg | ORAL_TABLET | Freq: Two times a day (BID) | ORAL | Status: DC

## 2013-04-13 LAB — COMPLETE METABOLIC PANEL WITH GFR
AST: 12 U/L (ref 0–37)
Albumin: 4.1 g/dL (ref 3.5–5.2)
Alkaline Phosphatase: 60 U/L (ref 39–117)
BUN: 13 mg/dL (ref 6–23)
GFR, Est Non African American: >89 mL/min
Potassium: 5.3 mEq/L (ref 3.5–5.3)
Sodium: 140 mEq/L (ref 135–145)
Total Bilirubin: 0.3 mg/dL (ref 0.3–1.2)
Total Protein: 6.4 g/dL (ref 6.0–8.3)

## 2013-04-17 LAB — NMR LIPOPROFILE WITH LIPIDS
HDL Particle Number: 32.6 umol/L (ref >=30.5)
HDL-C: 48 mg/dL (ref >=40)
LDL Size: 20.8 nm (ref >20.5)
Large HDL-P: 3.7 umol/L — ABNORMAL LOW (ref >=4.8)
Large VLDL-P: 1 nmol/L (ref <=2.7)
Small LDL Particle Number: 566 nmol/L — ABNORMAL HIGH (ref <=527)

## 2013-04-23 DIAGNOSIS — H2510 Age-related nuclear cataract, unspecified eye: Secondary | ICD-10-CM | POA: Diagnosis not present

## 2013-04-29 DIAGNOSIS — H2510 Age-related nuclear cataract, unspecified eye: Secondary | ICD-10-CM | POA: Diagnosis not present

## 2013-04-29 DIAGNOSIS — H269 Unspecified cataract: Secondary | ICD-10-CM | POA: Diagnosis not present

## 2013-05-10 ENCOUNTER — Other Ambulatory Visit: Payer: Self-pay

## 2013-05-10 MED ORDER — ALBUTEROL SULFATE HFA 108 (90 BASE) MCG/ACT IN AERS: 2.0000 | INHALATION_SPRAY | Freq: Four times a day (QID) | RESPIRATORY_TRACT | Status: DC | PRN

## 2013-05-10 MED ORDER — MONTELUKAST SODIUM 10 MG PO TABS: 10.0000 mg | ORAL_TABLET | Freq: Every day | ORAL | Status: DC

## 2013-05-10 MED ORDER — METFORMIN HCL 1000 MG PO TABS: 1000.0000 mg | ORAL_TABLET | Freq: Two times a day (BID) | ORAL | Status: DC

## 2013-05-10 MED ORDER — MELOXICAM 7.5 MG PO TABS: 7.5000 mg | ORAL_TABLET | Freq: Two times a day (BID) | ORAL | Status: DC

## 2013-05-10 MED ORDER — AMLODIPINE BESYLATE 10 MG PO TABS: 10.0000 mg | ORAL_TABLET | Freq: Every day | ORAL | Status: DC

## 2013-06-20 ENCOUNTER — Other Ambulatory Visit: Payer: Self-pay | Admitting: *Deleted

## 2013-06-20 MED ORDER — METFORMIN HCL 1000 MG PO TABS: 1000.0000 mg | ORAL_TABLET | Freq: Two times a day (BID) | ORAL | Status: DC

## 2013-06-20 MED ORDER — FLUTICASONE-SALMETEROL 250-50 MCG/DOSE IN AEPB: 1.0000 | INHALATION_SPRAY | Freq: Two times a day (BID) | RESPIRATORY_TRACT | Status: DC

## 2013-07-18 ENCOUNTER — Other Ambulatory Visit: Payer: Self-pay

## 2013-07-18 MED ORDER — MELOXICAM 7.5 MG PO TABS: 7.5000 mg | ORAL_TABLET | Freq: Two times a day (BID) | ORAL | Status: DC

## 2013-07-18 MED ORDER — ALBUTEROL SULFATE HFA 108 (90 BASE) MCG/ACT IN AERS: INHALATION_SPRAY | RESPIRATORY_TRACT | Status: DC

## 2013-07-18 MED ORDER — RANITIDINE HCL 150 MG PO TABS: 150.0000 mg | ORAL_TABLET | Freq: Two times a day (BID) | ORAL | Status: DC

## 2013-07-18 MED ORDER — PRAVASTATIN SODIUM 40 MG PO TABS: 40.0000 mg | ORAL_TABLET | Freq: Every day | ORAL | Status: DC

## 2013-07-31 ENCOUNTER — Ambulatory Visit (INDEPENDENT_AMBULATORY_CARE_PROVIDER_SITE_OTHER): Payer: Medicare Other

## 2013-07-31 ENCOUNTER — Ambulatory Visit (INDEPENDENT_AMBULATORY_CARE_PROVIDER_SITE_OTHER): Payer: Medicare Other | Admitting: Family Medicine

## 2013-07-31 ENCOUNTER — Encounter: Payer: Self-pay | Admitting: Family Medicine

## 2013-07-31 VITALS — BP 141/73 | HR 72 | Temp 97.8°F | Ht 74.0 in | Wt 219.0 lb

## 2013-07-31 DIAGNOSIS — E785 Hyperlipidemia, unspecified: Secondary | ICD-10-CM | POA: Diagnosis not present

## 2013-07-31 DIAGNOSIS — Z125 Encounter for screening for malignant neoplasm of prostate: Secondary | ICD-10-CM

## 2013-07-31 DIAGNOSIS — E1149 Type 2 diabetes mellitus with other diabetic neurological complication: Secondary | ICD-10-CM

## 2013-07-31 DIAGNOSIS — I10 Essential (primary) hypertension: Secondary | ICD-10-CM

## 2013-07-31 DIAGNOSIS — Z0000 Encounter for general adult medical examination without abnormal findings: Secondary | ICD-10-CM | POA: Diagnosis not present

## 2013-07-31 DIAGNOSIS — E559 Vitamin D deficiency, unspecified: Secondary | ICD-10-CM | POA: Diagnosis not present

## 2013-07-31 DIAGNOSIS — E119 Type 2 diabetes mellitus without complications: Secondary | ICD-10-CM | POA: Diagnosis not present

## 2013-07-31 DIAGNOSIS — J441 Chronic obstructive pulmonary disease with (acute) exacerbation: Secondary | ICD-10-CM | POA: Diagnosis not present

## 2013-07-31 DIAGNOSIS — I499 Cardiac arrhythmia, unspecified: Secondary | ICD-10-CM

## 2013-07-31 DIAGNOSIS — E1140 Type 2 diabetes mellitus with diabetic neuropathy, unspecified: Secondary | ICD-10-CM

## 2013-07-31 LAB — POCT CBC
Granulocyte percent: 62.7 %G (ref 37–80)
HCT, POC: 42.2 % — AB (ref 43.5–53.7)
Hemoglobin: 13.9 g/dL — AB (ref 14.1–18.1)
MCV: 87.3 fL (ref 80–97)
POC Granulocyte: 4.3 (ref 2–6.9)
RDW, POC: 13.7 %
WBC: 6.8 10*3/uL (ref 4.6–10.2)

## 2013-07-31 IMAGING — CR DG CHEST 2V
2 series · 2 of 2 positions shown · non-contrast
Comparison: None.

CLINICAL DATA: Hypertension

EXAM:
CHEST  2 VIEW

[view not recorded (1 of 2)]
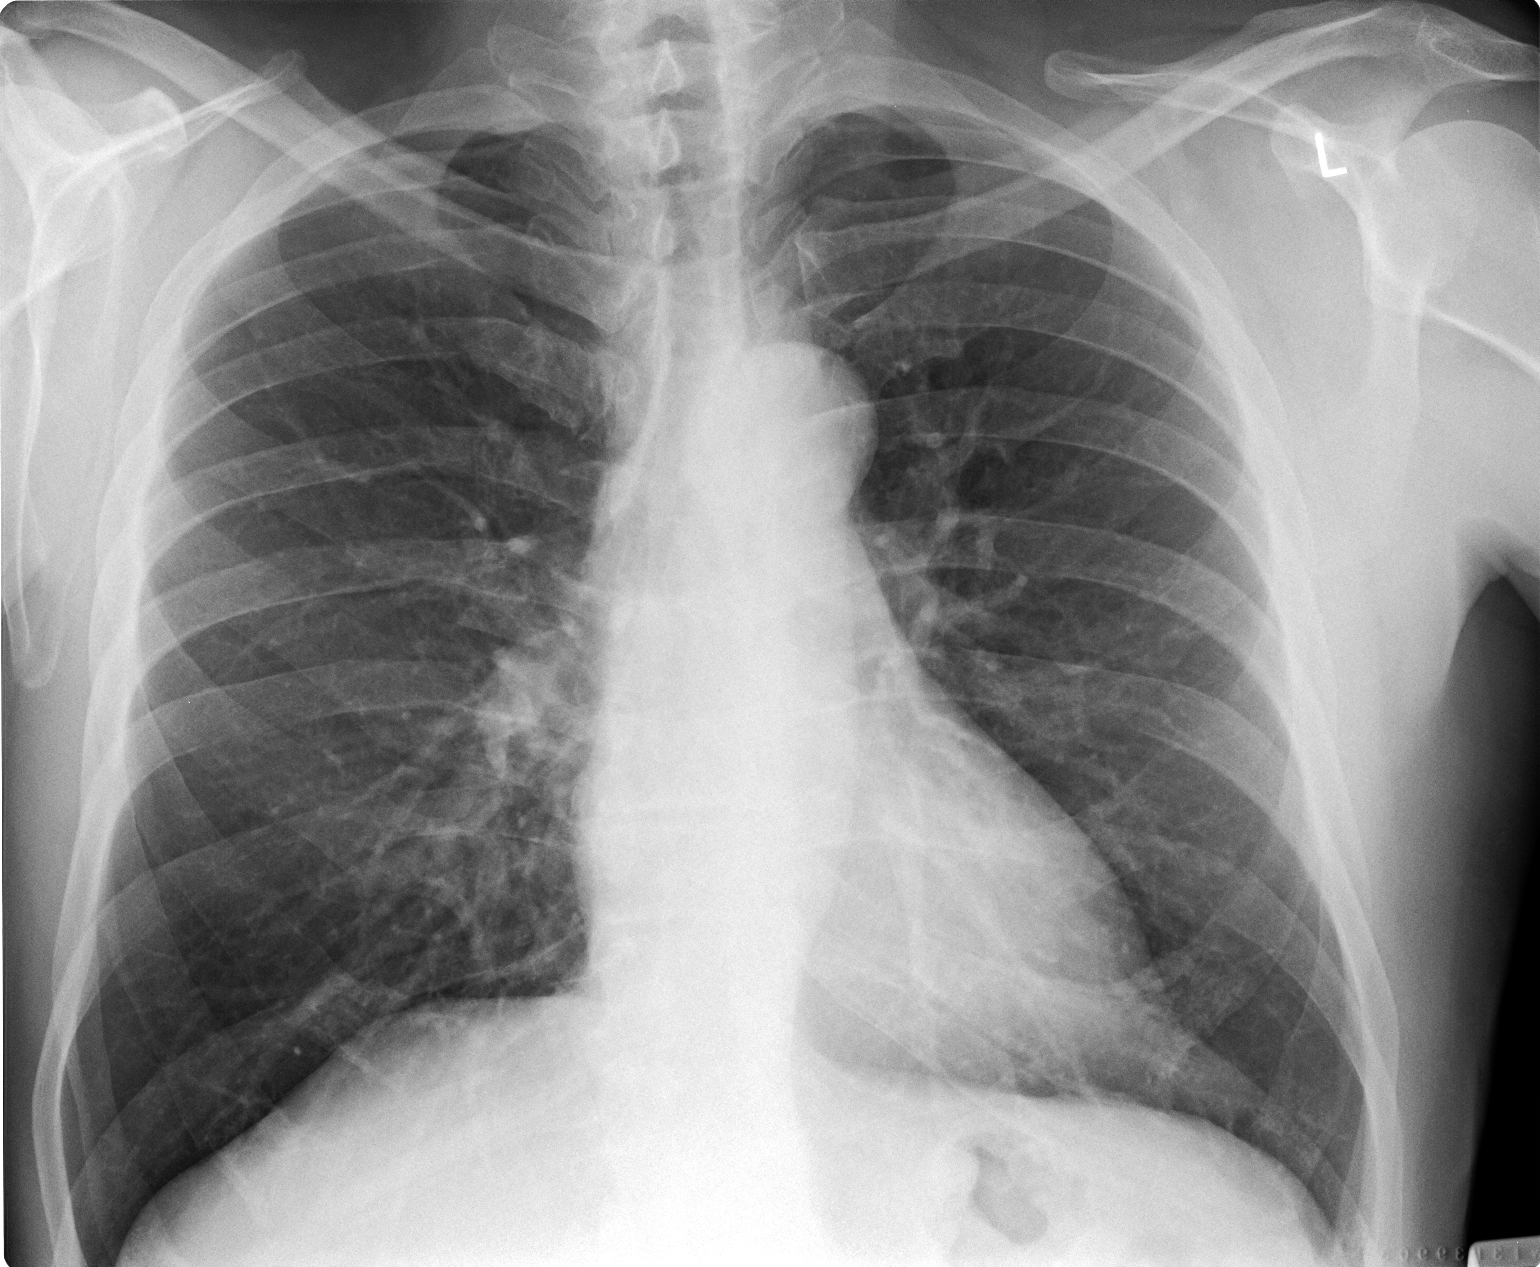

[view not recorded (2 of 2)]
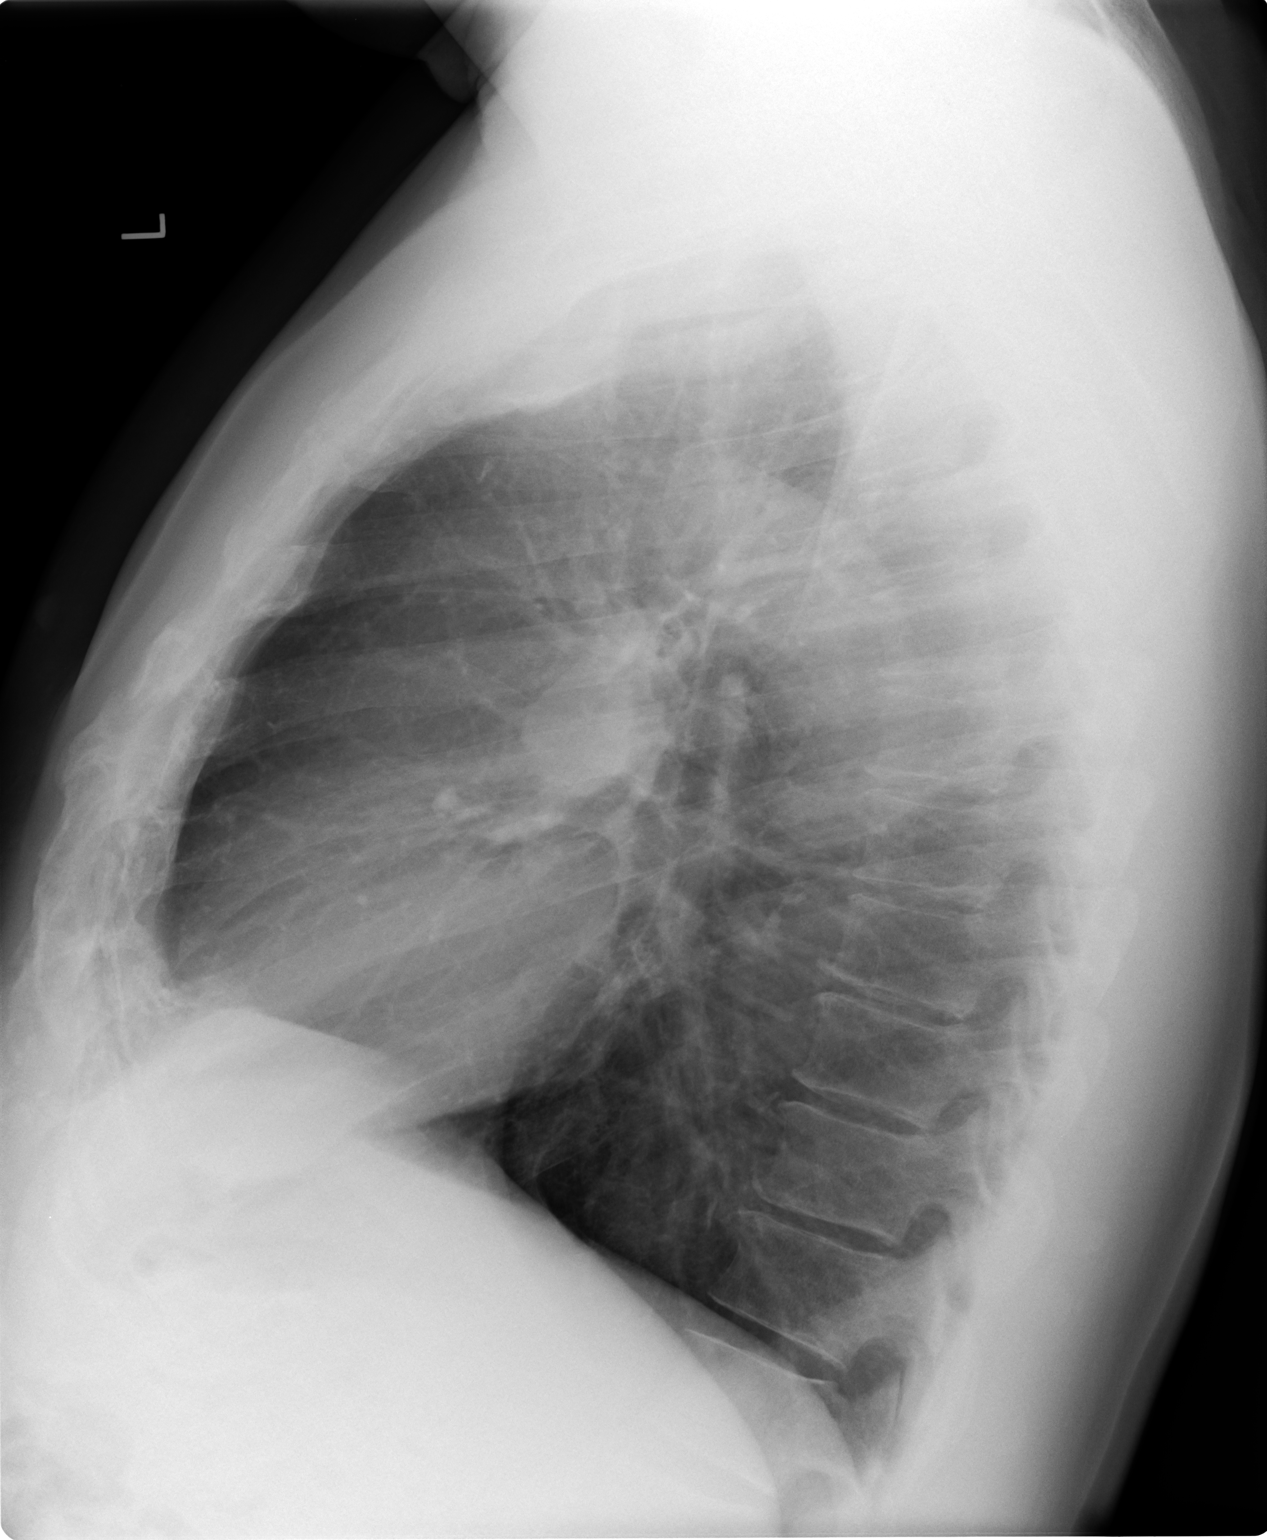

[2 of 2 positions shown; findings below may reference images not displayed]

FINDINGS: The heart size and mediastinal contours are within normal limits.
Both lungs are clear. Mild degenerative changes thoracic spine.
IMPRESSION: No active cardiopulmonary disease.

## 2013-07-31 NOTE — Addendum Note (Signed)
Addended by: Magdalene River on: 07/31/2013 11:22 AM   Modules accepted: Orders

## 2013-07-31 NOTE — Addendum Note (Signed)
Addended by: Ernestina Penna on: 07/31/2013 11:01 AM   Modules accepted: Level of Service

## 2013-07-31 NOTE — Patient Instructions (Signed)
Continue current medication Continue aggressive therapeutic lifestyle change Be careful and did not put yourself at risk of falling A tetanus immunization and shingles shot is due A chest x-ray is also due

## 2013-07-31 NOTE — Progress Notes (Addendum)
  Subjective:    [name], male    DOB: July 24, 1951, 62 y.o.   MRN: [id number]  HPI Patient returns to clinic today for followup and management of chronic medical problems. These include hypertension, hyperlipidemia, diabetes mellitus type 2 controlled, and COPD.: His health maintenance he is in need of a tdap, Zostavax and chest x-ray. He will also be getting labs today and a urine microalbumin., Blood sugars run between 86 and 130 fasting and at bedtime. Blood pressures are good. Patient says he is active at home even though he does not work and denies any problems with his breathing or heart.   Review of Systems  Constitutional: Negative.   HENT: Negative.   Eyes: Negative.   Respiratory: Negative.   Cardiovascular: Negative.   Gastrointestinal: Negative.   Endocrine: Negative.   Genitourinary: Negative.   Musculoskeletal: Negative.   Skin: Negative.   Allergic/Immunologic: Negative.   Neurological: Negative.   Hematological: Negative.   Psychiatric/Behavioral: Negative.        Objective:   Physical Exam BP 141/73  Pulse 72  Temp(Src) 97.8 F (36.6 C) (Oral)  Ht 6\' 2"  (1.88 m)  Wt 219 lb (99.338 kg)  BMI 28.11 kg/m2  The patient appeared well nourished and normally developed, alert and oriented to time and place. Speech, behavior and judgement appear normal. Vital signs as documented.  Head exam is unremarkable. No scleral icterus or pallor noted. He does have bilateral ear cerumen, TMs were visible. He is edentulous on the exam today but he has partials that he wears at home. Mouth and throat were otherwise normal. Neck is without jugular venous distension, or thyromegally. . Carotid upstrokes are brisk bilaterally. No cervical adenopathy. Lungs are clear anteriorly and posteriorly to auscultation. Normal respiratory effort. Cardiac exam reveals slightly irregular rate and rhythm at 72 per minute. The irregularity appears to be a an occasional PVC .First  and second heart sounds normal. No murmurs, rubs or gallops.  Abdominal exam reveals normal bowl sounds, no masses, no organomegaly and no aortic enlargement. No inguinal adenopathy. Extremities are nonedematous and both femoral and pedal pulses are normal. Skin without pallor or jaundice.  Warm and dry, without rash. Neurologic exam reveals normal deep tendon reflexes and normal sensation. Diabetic foot exam was done  A rectal exam was done today and this revealed BPH. There were no lumps in the prostate gland and there were no rectal masses. There is no inguinal hernia and the genitalia were normal.        Assessment & Plan:  1. Diabetes - POCT glycosylated hemoglobin (Hb A1C) - POCT UA - Microalbumin  2. COPD exacerbation - POCT CBC  3. HTN (hypertension) - Hepatic function panel - BMP8+EGFR - POCT UA - Microalbumin  4. hyperlipidemia - NMR, lipoprofile  5. Diabetic neuropathy, type II diabetes mellitus - POCT CBC - POCT glycosylated hemoglobin (Hb A1C)  6. BPH  Patient Instructions  Continue current medication Continue aggressive therapeutic lifestyle change Be careful and did not put yourself at risk of falling A tetanus immunization and shingles shot is due A chest x-ray is also due   do not forget to get flu shot in October  Nyra Capes MD

## 2013-07-31 NOTE — Addendum Note (Signed)
Addended by: Prescott Gum on: 07/31/2013 11:15 AM   Modules accepted: Orders

## 2013-08-01 LAB — MICROALBUMIN, URINE: Microalbumin, Urine: 9.2 ug/mL (ref 0.0–17.0)

## 2013-08-02 LAB — HEPATIC FUNCTION PANEL
AST: 14 IU/L (ref 0–40)
Albumin: 4.4 g/dL (ref 3.6–4.8)
Alkaline Phosphatase: 70 IU/L (ref 39–117)
Bilirubin, Direct: 0.09 mg/dL (ref 0.00–0.40)
Total Protein: 6.5 g/dL (ref 6.0–8.5)

## 2013-08-02 LAB — NMR, LIPOPROFILE
Cholesterol: 179 mg/dL (ref <200)
HDL Particle Number: 27.6 umol/L — ABNORMAL LOW (ref >=30.5)
LDL Size: 21 nm (ref >20.5)
LP-IR Score: 34 (ref <=45)
Small LDL Particle Number: 582 nmol/L — ABNORMAL HIGH (ref <=527)
Triglycerides by NMR: 136 mg/dL (ref <150)

## 2013-08-02 LAB — BMP8+EGFR
BUN/Creatinine Ratio: 24 — ABNORMAL HIGH (ref 10–22)
BUN: 20 mg/dL (ref 8–27)
CO2: 25 mmol/L (ref 18–29)
Chloride: 100 mmol/L (ref 97–108)
GFR calc Af Amer: 109 mL/min/{1.73_m2} (ref >59)
Potassium: 4.9 mmol/L (ref 3.5–5.2)
Sodium: 140 mmol/L (ref 134–144)

## 2013-08-02 LAB — PSA, TOTAL AND FREE: PSA, Free: 0.17 ng/mL

## 2013-08-20 ENCOUNTER — Other Ambulatory Visit: Payer: Self-pay

## 2013-08-20 MED ORDER — METFORMIN HCL 1000 MG PO TABS: 1000.0000 mg | ORAL_TABLET | Freq: Two times a day (BID) | ORAL | Status: DC

## 2013-08-20 MED ORDER — FLUTICASONE-SALMETEROL 250-50 MCG/DOSE IN AEPB: 1.0000 | INHALATION_SPRAY | Freq: Two times a day (BID) | RESPIRATORY_TRACT | Status: DC

## 2013-08-21 ENCOUNTER — Ambulatory Visit (INDEPENDENT_AMBULATORY_CARE_PROVIDER_SITE_OTHER): Payer: Medicare Other

## 2013-08-21 DIAGNOSIS — Z23 Encounter for immunization: Secondary | ICD-10-CM

## 2013-09-23 ENCOUNTER — Other Ambulatory Visit: Payer: Self-pay | Admitting: *Deleted

## 2013-09-23 MED ORDER — AMLODIPINE BESYLATE 10 MG PO TABS: 10.0000 mg | ORAL_TABLET | Freq: Every day | ORAL | Status: DC

## 2013-09-23 MED ORDER — MONTELUKAST SODIUM 10 MG PO TABS: 10.0000 mg | ORAL_TABLET | Freq: Every day | ORAL | Status: DC

## 2013-09-30 ENCOUNTER — Other Ambulatory Visit: Payer: Self-pay

## 2013-09-30 MED ORDER — TADALAFIL 5 MG PO TABS: 5.0000 mg | ORAL_TABLET | Freq: Every day | ORAL | Status: DC | PRN

## 2013-09-30 NOTE — Telephone Encounter (Signed)
Last seen 05/14/13  DWM   This med not on EPIC list  If approved route to nurse to phone into the drug store

## 2013-10-15 ENCOUNTER — Other Ambulatory Visit: Payer: Self-pay

## 2013-10-15 MED ORDER — TADALAFIL 5 MG PO TABS: 5.0000 mg | ORAL_TABLET | Freq: Every day | ORAL | Status: DC | PRN

## 2013-10-15 NOTE — Telephone Encounter (Signed)
Last seen 07/31/13  DWM  If approved route to nurse to call in to the Drug Store

## 2013-10-15 NOTE — Telephone Encounter (Signed)
This prescription is okay to call and to drug store

## 2013-10-24 ENCOUNTER — Other Ambulatory Visit: Payer: Self-pay | Admitting: *Deleted

## 2013-10-24 MED ORDER — MELOXICAM 7.5 MG PO TABS: 7.5000 mg | ORAL_TABLET | Freq: Two times a day (BID) | ORAL | Status: DC

## 2013-10-24 MED ORDER — RANITIDINE HCL 150 MG PO TABS: 150.0000 mg | ORAL_TABLET | Freq: Two times a day (BID) | ORAL | Status: DC

## 2013-10-24 MED ORDER — ALBUTEROL SULFATE HFA 108 (90 BASE) MCG/ACT IN AERS: 2.0000 | INHALATION_SPRAY | Freq: Four times a day (QID) | RESPIRATORY_TRACT | Status: DC | PRN

## 2013-10-24 MED ORDER — PRAVASTATIN SODIUM 40 MG PO TABS: 40.0000 mg | ORAL_TABLET | Freq: Every day | ORAL | Status: DC

## 2013-11-05 ENCOUNTER — Ambulatory Visit (INDEPENDENT_AMBULATORY_CARE_PROVIDER_SITE_OTHER): Payer: Medicare Other | Admitting: Family Medicine

## 2013-11-05 ENCOUNTER — Encounter: Payer: Self-pay | Admitting: Family Medicine

## 2013-11-05 VITALS — BP 156/75 | HR 75 | Temp 98.5°F | Ht 74.0 in | Wt 222.0 lb

## 2013-11-05 DIAGNOSIS — I10 Essential (primary) hypertension: Secondary | ICD-10-CM

## 2013-11-05 DIAGNOSIS — E1149 Type 2 diabetes mellitus with other diabetic neurological complication: Secondary | ICD-10-CM

## 2013-11-05 DIAGNOSIS — J441 Chronic obstructive pulmonary disease with (acute) exacerbation: Secondary | ICD-10-CM | POA: Diagnosis not present

## 2013-11-05 DIAGNOSIS — N529 Male erectile dysfunction, unspecified: Secondary | ICD-10-CM

## 2013-11-05 DIAGNOSIS — E785 Hyperlipidemia, unspecified: Secondary | ICD-10-CM | POA: Diagnosis not present

## 2013-11-05 DIAGNOSIS — E1140 Type 2 diabetes mellitus with diabetic neuropathy, unspecified: Secondary | ICD-10-CM

## 2013-11-05 DIAGNOSIS — E1142 Type 2 diabetes mellitus with diabetic polyneuropathy: Secondary | ICD-10-CM

## 2013-11-05 DIAGNOSIS — E559 Vitamin D deficiency, unspecified: Secondary | ICD-10-CM

## 2013-11-05 NOTE — Progress Notes (Signed)
Subjective:    [name], male    DOB: Mar 02, 1951, 62 y.o.   MRN: [id number]  HPI Pt here for follow up and management of chronic medical problems. Outside blood sugars were reviewed and a copy of this will be placed in the patient's electronic medical record.     Patient Active Problem List   Diagnosis Date Noted  . Bilateral carotid artery disease 04/11/2013  . hyperlipidemia 04/11/2013  . HTN (hypertension) 04/11/2013  . COPD exacerbation 04/11/2013  . Diabetes mellitus type 2 controlled 04/11/2013  . Diabetic neuropathy, type II diabetes mellitus 03/15/2013   Outpatient Encounter Prescriptions as of 11/05/2013  Medication Sig  . albuterol (PROVENTIL HFA;VENTOLIN HFA) 108 (90 BASE) MCG/ACT inhaler Inhale 2 puffs into the lungs every 6 (six) hours as needed for wheezing.  Marland Kitchen amLODipine (NORVASC) 10 MG tablet Take 1 tablet (10 mg total) by mouth daily.  Marland Kitchen aspirin 325 MG tablet Take 325 mg by mouth daily.  Marland Kitchen BIOTIN 5000 PO Take 1 capsule by mouth daily.  . cholecalciferol (VITAMIN D) 1000 UNITS tablet Take 1,000 Units by mouth daily.  . Fluticasone-Salmeterol (ADVAIR DISKUS) 250-50 MCG/DOSE AEPB Inhale 1 puff into the lungs 2 (two) times daily.  Marland Kitchen glucose blood (ACCU-CHEK ACTIVE STRIPS) test strip Use as instructed  . meloxicam (MOBIC) 7.5 MG tablet Take 1 tablet (7.5 mg total) by mouth 2 (two) times daily.  . metFORMIN (GLUCOPHAGE) 1000 MG tablet Take 1 tablet (1,000 mg total) by mouth 2 (two) times daily with a meal.  . montelukast (SINGULAIR) 10 MG tablet Take 1 tablet (10 mg total) by mouth at bedtime.  . pravastatin (PRAVACHOL) 40 MG tablet Take 1 tablet (40 mg total) by mouth daily.  . ranitidine (ZANTAC) 150 MG tablet Take 1 tablet (150 mg total) by mouth 2 (two) times daily.  . tadalafil (CIALIS) 5 MG tablet Take 1 tablet (5 mg total) by mouth daily as needed for erectile dysfunction.    Review of Systems  Constitutional: Negative.   HENT: Negative.     Eyes: Negative.   Respiratory: Negative.   Cardiovascular: Negative.   Gastrointestinal: Negative.   Endocrine: Negative.   Genitourinary: Negative.   Musculoskeletal: Negative.   Skin: Negative.   Allergic/Immunologic: Negative.   Neurological: Negative.   Hematological: Negative.   Psychiatric/Behavioral: Negative.        Objective:   Physical Exam  Nursing note and vitals reviewed. Constitutional: He is oriented to person, place, and time. He appears well-developed and well-nourished. No distress.  HENT:  Head: Normocephalic and atraumatic.  Right Ear: External ear normal.  Left Ear: External ear normal.  Nose: Nose normal.  Mouth/Throat: Oropharynx is clear and moist. No oropharyngeal exudate.  Patient is edentulous  Eyes: Conjunctivae and EOM are normal. Pupils are equal, round, and reactive to light. Right eye exhibits no discharge. Left eye exhibits no discharge. No scleral icterus.  He indicates his last eye exam was this past summer  Neck: Normal range of motion. Neck supple. No thyromegaly present.  Cardiovascular: Normal rate, regular rhythm and intact distal pulses.  Exam reveals no gallop and no friction rub.   Murmur heard. At 96 per minute, there is a grade 1-2/6 systolic ejection murmur  Pulmonary/Chest: Effort normal and breath sounds normal. No respiratory distress. He has no wheezes. He has no rales. He exhibits no tenderness.  No axillary nodes  Abdominal: Soft. Bowel sounds are normal. He exhibits no mass. There is no tenderness. There  is no rebound and no guarding.  No inguinal nodes  Musculoskeletal: Normal range of motion. He exhibits no edema and no tenderness.  Lymphadenopathy:    He has no cervical adenopathy.  Neurological: He is alert and oriented to person, place, and time. He has normal reflexes. No cranial nerve deficit.  Skin: Skin is warm and dry. No rash noted. No erythema. No pallor.  Psychiatric: He has a normal mood and affect. His  behavior is normal. Judgment and thought content normal.   BP 156/75  Pulse 75  Temp(Src) 98.5 F (36.9 C) (Oral)  Ht 6\' 2"  (1.88 m)  Wt 222 lb (100.699 kg)  BMI 28.49 kg/m2        Assessment & Plan:  1. HTN (hypertension) - POCT CBC; Future - Hepatic function panel; Future - BMP8+EGFR; Future  2. hyperlipidemia - POCT CBC; Future - NMR, lipoprofile; Future  3. Diabetic neuropathy, type II diabetes mellitus - POCT CBC; Future - POCT glycosylated hemoglobin (Hb A1C); Future  4. COPD exacerbation - POCT CBC; Future  5. Vitamin D deficiency - Vit D  25 hydroxy (rtn osteoporosis monitoring); Future  6. Erectile dysfunction -samples of Viagra given to try  Meds ordered this encounter  Medications  . cholecalciferol (VITAMIN D) 1000 UNITS tablet    Sig: Take 1,000 Units by mouth daily.   Patient Instructions  Continue current medications. Continue good therapeutic lifestyle changes which include good diet and exercise. Fall precautions discussed with patient. Schedule your flu vaccine if you haven't had it yet If you are over 78 years old - you may need Prevnar 13 or the adult Pneumonia vaccine. Continue to monitor blood sugars regularly    Nyra Capes MD

## 2013-11-05 NOTE — Patient Instructions (Addendum)
Continue current medications. Continue good therapeutic lifestyle changes which include good diet and exercise. Fall precautions discussed with patient. Schedule your flu vaccine if you haven't had it yet If you are over 63 years old - you may need Prevnar 13 or the adult Pneumonia vaccine. Continue to monitor blood sugars regularly

## 2013-11-12 ENCOUNTER — Other Ambulatory Visit (INDEPENDENT_AMBULATORY_CARE_PROVIDER_SITE_OTHER): Payer: Medicare Other

## 2013-11-12 DIAGNOSIS — E559 Vitamin D deficiency, unspecified: Secondary | ICD-10-CM

## 2013-11-12 DIAGNOSIS — I10 Essential (primary) hypertension: Secondary | ICD-10-CM | POA: Diagnosis not present

## 2013-11-12 DIAGNOSIS — E785 Hyperlipidemia, unspecified: Secondary | ICD-10-CM

## 2013-11-12 NOTE — Progress Notes (Signed)
Patient came in for labs only.

## 2013-11-13 LAB — NMR, LIPOPROFILE
LDL Particle Number: 1490 nmol/L — ABNORMAL HIGH (ref <1000)
LDL Size: 21.1 nm (ref >20.5)
LDLC SERPL CALC-MCNC: 99 mg/dL (ref <100)
LP-IR Score: 49 — ABNORMAL HIGH (ref <=45)
Small LDL Particle Number: 687 nmol/L — ABNORMAL HIGH (ref <=527)
Triglycerides by NMR: 105 mg/dL (ref <150)

## 2013-11-13 LAB — BMP8+EGFR
BUN: 15 mg/dL (ref 8–27)
CO2: 23 mmol/L (ref 18–29)
Calcium: 9.6 mg/dL (ref 8.6–10.2)
Chloride: 101 mmol/L (ref 97–108)
Creatinine, Ser: 0.81 mg/dL (ref 0.76–1.27)
GFR calc non Af Amer: 95 mL/min/{1.73_m2} (ref >59)
Potassium: 5 mmol/L (ref 3.5–5.2)
Sodium: 140 mmol/L (ref 134–144)

## 2013-11-13 LAB — HEPATIC FUNCTION PANEL
ALT: 9 IU/L (ref 0–44)
AST: 12 IU/L (ref 0–40)
Albumin: 4.2 g/dL (ref 3.6–4.8)
Bilirubin, Direct: 0.09 mg/dL (ref 0.00–0.40)
Total Bilirubin: 0.2 mg/dL (ref 0.0–1.2)
Total Protein: 6.4 g/dL (ref 6.0–8.5)

## 2013-11-13 LAB — VITAMIN D 25 HYDROXY (VIT D DEFICIENCY, FRACTURES): Vit D, 25-Hydroxy: 36.9 ng/mL (ref 30.0–100.0)

## 2013-11-22 ENCOUNTER — Ambulatory Visit (INDEPENDENT_AMBULATORY_CARE_PROVIDER_SITE_OTHER): Payer: Medicare Other | Admitting: Family Medicine

## 2013-11-22 ENCOUNTER — Encounter: Payer: Self-pay | Admitting: Family Medicine

## 2013-11-22 VITALS — BP 153/78 | HR 81 | Temp 99.3°F | Ht 74.0 in | Wt 222.0 lb

## 2013-11-22 DIAGNOSIS — J069 Acute upper respiratory infection, unspecified: Secondary | ICD-10-CM | POA: Diagnosis not present

## 2013-11-22 DIAGNOSIS — E119 Type 2 diabetes mellitus without complications: Secondary | ICD-10-CM

## 2013-11-22 LAB — POCT GLYCOSYLATED HEMOGLOBIN (HGB A1C): Hemoglobin A1C: 6.7

## 2013-11-22 MED ORDER — AZITHROMYCIN 250 MG PO TABS: ORAL_TABLET | ORAL | Status: DC

## 2013-11-22 MED ORDER — CARBAMIDE PEROXIDE 6.5 % OT SOLN: 5.0000 [drp] | Freq: Two times a day (BID) | OTIC | Status: DC

## 2013-11-22 NOTE — Progress Notes (Signed)
   Subjective:    [name], male    DOB: 04/05/1951, 63 y.o.   MRN: [id number]  HPI This 63 y.o. male presents for evaluation of URI and otalgia. He is diabetic and is due For Hgbaic.   Review of Systems C/o ear pain No chest pain, SOB, HA, dizziness, vision change, N/V, diarrhea, constipation, dysuria, urinary urgency or frequency, myalgias, arthralgias or rash.     Objective:   Physical Exam  Vital signs noted  Well developed well nourished male.  HEENT - Head atraumatic Normocephalic                Eyes - PERRLA, Conjuctiva - clear Sclera- Clear EOMI                Ears - EAC's with cerumen impactions bilateral                Nose - Nares patent                 Throat - oropharanx wnl Respiratory - Lungs CTA bilateral Cardiac - RRR S1 and S2 without murmur GI - Abdomen soft Nontender and bowel sounds active x 4 Extremities - No edema. Neuro - Grossly intact.  Results for orders placed in visit on 11/22/13  POCT GLYCOSYLATED HEMOGLOBIN (HGB A1C)      Result Value Range   Hemoglobin A1C 6.7        Assessment & Plan:  Diabetes - Plan: POCT glycosylated hemoglobin (Hb A1C).  Controlled and continue current regimen.  URI, acute - Plan: azithromycin (ZITHROMAX) 250 MG tablet, carbamide peroxide (DEBROX) 6.5 % otic solution.  Follow up prn Push po fluids, rest, tylenol and motrin otc prn as directed for fever, arthralgias, and myalgias.  Follow up prn if sx's continue or persist.  Ceurmen impactions - Use debrox ear wax gtt's and then follow up in one week for recheck and irrigation of ears if needed  Lysbeth Penner FNP

## 2013-11-22 NOTE — Patient Instructions (Signed)

## 2013-11-25 ENCOUNTER — Other Ambulatory Visit: Payer: Self-pay

## 2013-11-25 MED ORDER — METFORMIN HCL 1000 MG PO TABS: 1000.0000 mg | ORAL_TABLET | Freq: Two times a day (BID) | ORAL | Status: DC

## 2013-11-29 ENCOUNTER — Ambulatory Visit (INDEPENDENT_AMBULATORY_CARE_PROVIDER_SITE_OTHER): Payer: Medicare Other | Admitting: *Deleted

## 2013-11-29 ENCOUNTER — Telehealth: Payer: Self-pay | Admitting: *Deleted

## 2013-11-29 DIAGNOSIS — Z23 Encounter for immunization: Secondary | ICD-10-CM

## 2013-12-04 ENCOUNTER — Encounter: Payer: Self-pay | Admitting: *Deleted

## 2013-12-09 ENCOUNTER — Encounter: Payer: Self-pay | Admitting: *Deleted

## 2014-01-20 ENCOUNTER — Other Ambulatory Visit: Payer: Self-pay | Admitting: *Deleted

## 2014-01-20 MED ORDER — MELOXICAM 7.5 MG PO TABS: 7.5000 mg | ORAL_TABLET | Freq: Two times a day (BID) | ORAL | Status: DC

## 2014-01-20 MED ORDER — PRAVASTATIN SODIUM 40 MG PO TABS: 40.0000 mg | ORAL_TABLET | Freq: Every day | ORAL | Status: DC

## 2014-01-20 MED ORDER — MONTELUKAST SODIUM 10 MG PO TABS: 10.0000 mg | ORAL_TABLET | Freq: Every day | ORAL | Status: DC

## 2014-01-20 MED ORDER — RANITIDINE HCL 150 MG PO TABS: 150.0000 mg | ORAL_TABLET | Freq: Two times a day (BID) | ORAL | Status: DC

## 2014-01-20 MED ORDER — TADALAFIL 5 MG PO TABS: 5.0000 mg | ORAL_TABLET | Freq: Every day | ORAL | Status: DC | PRN

## 2014-01-20 MED ORDER — ALBUTEROL SULFATE HFA 108 (90 BASE) MCG/ACT IN AERS: 2.0000 | INHALATION_SPRAY | Freq: Four times a day (QID) | RESPIRATORY_TRACT | Status: DC | PRN

## 2014-01-20 MED ORDER — AMLODIPINE BESYLATE 10 MG PO TABS: 10.0000 mg | ORAL_TABLET | Freq: Every day | ORAL | Status: DC

## 2014-01-20 NOTE — Telephone Encounter (Signed)
Route to pool to call in if approved.

## 2014-01-24 ENCOUNTER — Telehealth: Payer: Self-pay | Admitting: Family Medicine

## 2014-02-03 ENCOUNTER — Ambulatory Visit (INDEPENDENT_AMBULATORY_CARE_PROVIDER_SITE_OTHER): Payer: Medicare Other | Admitting: Family Medicine

## 2014-02-03 ENCOUNTER — Encounter: Payer: Self-pay | Admitting: Family Medicine

## 2014-02-03 VITALS — BP 132/75 | HR 72 | Temp 98.1°F | Ht 74.0 in | Wt 217.0 lb

## 2014-02-03 DIAGNOSIS — E119 Type 2 diabetes mellitus without complications: Secondary | ICD-10-CM

## 2014-02-03 DIAGNOSIS — E559 Vitamin D deficiency, unspecified: Secondary | ICD-10-CM

## 2014-02-03 DIAGNOSIS — E785 Hyperlipidemia, unspecified: Secondary | ICD-10-CM

## 2014-02-03 DIAGNOSIS — I779 Disorder of arteries and arterioles, unspecified: Secondary | ICD-10-CM

## 2014-02-03 DIAGNOSIS — I10 Essential (primary) hypertension: Secondary | ICD-10-CM

## 2014-02-03 DIAGNOSIS — R011 Cardiac murmur, unspecified: Secondary | ICD-10-CM

## 2014-02-03 DIAGNOSIS — I739 Peripheral vascular disease, unspecified: Secondary | ICD-10-CM

## 2014-02-03 NOTE — Progress Notes (Addendum)
Subjective:    [name], male    DOB: 1951/08/03, 63 y.o.   MRN: [id number]  HPI Pt here for follow up and management of chronic medical problems. The patient will return later this month where his lab work. He will need to get a Prevnar vaccine in January 2016. His last hemoglobin A1c was 6.7% and this was in January of this year. He will be given an FOBT return. The patient is doing well with no particular complaints. He requests samples of Cialis. His lab work from January was reviewed with him today. His most recent lab work was reviewed with him.       Patient Active Problem List   Diagnosis Date Noted  . Erectile dysfunction 11/05/2013  . Bilateral carotid artery disease 04/11/2013  . hyperlipidemia 04/11/2013  . HTN (hypertension) 04/11/2013  . COPD exacerbation 04/11/2013  . Diabetes mellitus type 2 controlled 04/11/2013  . Diabetic neuropathy, type II diabetes mellitus 03/15/2013   Outpatient Encounter Prescriptions as of 02/03/2014  Medication Sig  . amLODipine (NORVASC) 10 MG tablet Take 1 tablet (10 mg total) by mouth daily.  Marland Kitchen aspirin 325 MG tablet Take 325 mg by mouth daily.  Marland Kitchen BIOTIN 5000 PO Take 1 capsule by mouth daily.  . cholecalciferol (VITAMIN D) 1000 UNITS tablet Take 1,000 Units by mouth daily.  Marland Kitchen glucose blood (ACCU-CHEK ACTIVE STRIPS) test strip Use as instructed  . meloxicam (MOBIC) 7.5 MG tablet Take 1 tablet (7.5 mg total) by mouth 2 (two) times daily.  . metFORMIN (GLUCOPHAGE) 1000 MG tablet Take 1 tablet (1,000 mg total) by mouth 2 (two) times daily with a meal.  . montelukast (SINGULAIR) 10 MG tablet Take 1 tablet (10 mg total) by mouth at bedtime.  . pravastatin (PRAVACHOL) 40 MG tablet Take 1 tablet (40 mg total) by mouth daily.  . ranitidine (ZANTAC) 150 MG tablet Take 1 tablet (150 mg total) by mouth 2 (two) times daily.  . [DISCONTINUED] albuterol (PROVENTIL HFA;VENTOLIN HFA) 108 (90 BASE) MCG/ACT inhaler Inhale 2 puffs into  the lungs every 6 (six) hours as needed for wheezing.  . tadalafil (CIALIS) 5 MG tablet Take 1 tablet (5 mg total) by mouth daily as needed for erectile dysfunction.  . [DISCONTINUED] azithromycin (ZITHROMAX) 250 MG tablet Take 2 po first day and then one po qd x 4 days  . [DISCONTINUED] carbamide peroxide (DEBROX) 6.5 % otic solution Place 5 drops into both ears 2 (two) times daily.  . [DISCONTINUED] Fluticasone-Salmeterol (ADVAIR DISKUS) 250-50 MCG/DOSE AEPB Inhale 1 puff into the lungs 2 (two) times daily.    Review of Systems  Constitutional: Negative.   HENT: Negative.   Eyes: Negative.   Respiratory: Negative.   Cardiovascular: Negative.   Gastrointestinal: Negative.   Endocrine: Negative.   Genitourinary: Negative.   Musculoskeletal: Negative.   Skin: Negative.   Allergic/Immunologic: Negative.   Neurological: Negative.   Hematological: Negative.   Psychiatric/Behavioral: Negative.        Objective:   Physical Exam  Nursing note and vitals reviewed. Constitutional: He is oriented to person, place, and time. He appears well-developed and well-nourished. No distress.  The patient was alert and cooperative  HENT:  Head: Normocephalic and atraumatic.  Right Ear: External ear normal.  Left Ear: External ear normal.  Nose: Nose normal.  Mouth/Throat: Oropharynx is clear and moist. No oropharyngeal exudate.  Eyes: Conjunctivae and EOM are normal. Pupils are equal, round, and reactive to light. Right eye exhibits no  discharge. Left eye exhibits no discharge. No scleral icterus.  Neck: Normal range of motion. Neck supple. No thyromegaly present.  Cardiovascular: Normal rate, regular rhythm and intact distal pulses.  Exam reveals no gallop and no friction rub.   Murmur (he has a grade 1/6 systolic ejection murmur) heard. At 72 per minute  Pulmonary/Chest: Effort normal and breath sounds normal. No respiratory distress. He has no wheezes. He has no rales. He exhibits no  tenderness.  Abdominal: Soft. Bowel sounds are normal. He exhibits no mass. There is no tenderness. There is no rebound and no guarding.  Musculoskeletal: Normal range of motion. He exhibits no edema and no tenderness.  Lymphadenopathy:    He has no cervical adenopathy.  Neurological: He is alert and oriented to person, place, and time. He has normal reflexes. No cranial nerve deficit.  Skin: Skin is warm and dry. No rash noted. No erythema. No pallor.  Psychiatric: He has a normal mood and affect. His behavior is normal. Judgment and thought content normal.   BP 132/75  Pulse 72  Temp(Src) 98.1 F (36.7 C) (Oral)  Ht _0  (1.88 m)  Wt 217 lb (98.431 kg)  BMI 27.85 kg/m2        Assessment & Plan:  1. Bilateral carotid artery disease - POCT CBC; Future  2. Diabetes mellitus type 2 controlled - POCT CBC; Future - POCT glycosylated hemoglobin (Hb A1C); Future - POCT UA - Microalbumin; Future  3. HTN (hypertension) - POCT CBC; Future - BMP8+EGFR; Future - POCT UA - Microalbumin; Future - Hepatic function panel; Future  4. hyperlipidemia - POCT CBC; Future - NMR, lipoprofile; Future  5. Vitamin D deficiency - Vit D  25 hydroxy (rtn osteoporosis monitoring); Future  6. Systolic ejection murmur -An echocardiogram will be scheduled for Mr. Carmickle  Patient Instructions                       Medicare Annual Wellness Visit  [facility] and the medical providers at [facility] strive to bring you the best medical care.  In doing so we not only want to address your current medical conditions and concerns but also to detect new conditions early and prevent illness, disease and health-related problems.    Medicare offers a yearly Wellness Visit which allows our clinical staff to assess your need for preventative services including immunizations, lifestyle education, counseling to decrease risk of preventable diseases and screening for fall risk and other  medical concerns.    This visit is provided free of charge (no copay) for all Medicare recipients. The clinical pharmacists at [facility] have begun to conduct these Wellness Visits which will also include a thorough review of all your medications.    As you primary medical provider recommend that you make an appointment for your Annual Wellness Visit if you have not done so already this year.  You may set up this appointment before you leave today or you may call back [phone]) and schedule an appointment.  Please make sure when you call that you mention that you are scheduling your Annual Wellness Visit with the clinical pharmacist so that the appointment may be made for the proper length of time.     Continue current medications. Continue good therapeutic lifestyle changes which include good diet and exercise. Fall precautions discussed with patient. If an FOBT was given today- please return it to our front desk. If you are over 50 years  old - you may need Prevnar 13 or the adult Pneumonia vaccine.  Continue to exercise regularly Continue to take your vitamin D Continue to monitor blood sugars regularly Continue to watch your diet as closely as possible   Arrie Senate MD

## 2014-02-03 NOTE — Patient Instructions (Addendum)
Medicare Annual Wellness Visit  [facility] and the medical providers at [facility] strive to bring you the best medical care.  In doing so we not only want to address your current medical conditions and concerns but also to detect new conditions early and prevent illness, disease and health-related problems.    Medicare offers a yearly Wellness Visit which allows our clinical staff to assess your need for preventative services including immunizations, lifestyle education, counseling to decrease risk of preventable diseases and screening for fall risk and other medical concerns.    This visit is provided free of charge (no copay) for all Medicare recipients. The clinical pharmacists at [facility] have begun to conduct these Wellness Visits which will also include a thorough review of all your medications.    As you primary medical provider recommend that you make an appointment for your Annual Wellness Visit if you have not done so already this year.  You may set up this appointment before you leave today or you may call back ([phone]) and schedule an appointment.  Please make sure when you call that you mention that you are scheduling your Annual Wellness Visit with the clinical pharmacist so that the appointment may be made for the proper length of time.     Continue current medications. Continue good therapeutic lifestyle changes which include good diet and exercise. Fall precautions discussed with patient. If an FOBT was given today- please return it to our front desk. If you are over 56 years old - you may need Prevnar 82 or the adult Pneumonia vaccine.  Continue to exercise regularly Continue to take your vitamin D Continue to monitor blood sugars regularly Continue to watch your diet as closely as possible

## 2014-02-03 NOTE — Addendum Note (Signed)
Addended by: Ilean China on: 02/03/2014 11:45 AM   Modules accepted: Orders

## 2014-02-06 ENCOUNTER — Ambulatory Visit (HOSPITAL_COMMUNITY)
Admission: RE | Admit: 2014-02-06 | Discharge: 2014-02-06 | Disposition: A | Discharge status: Home / Self Care | Payer: Medicare Other | Source: Ambulatory Visit | Attending: Family Medicine | Admitting: Family Medicine

## 2014-02-06 ENCOUNTER — Ambulatory Visit: Payer: Medicare Other | Admitting: Family Medicine

## 2014-02-06 DIAGNOSIS — J449 Chronic obstructive pulmonary disease, unspecified: Secondary | ICD-10-CM | POA: Diagnosis not present

## 2014-02-06 DIAGNOSIS — I10 Essential (primary) hypertension: Secondary | ICD-10-CM | POA: Diagnosis not present

## 2014-02-06 DIAGNOSIS — R011 Cardiac murmur, unspecified: Secondary | ICD-10-CM | POA: Insufficient documentation

## 2014-02-06 DIAGNOSIS — J4489 Other specified chronic obstructive pulmonary disease: Secondary | ICD-10-CM | POA: Insufficient documentation

## 2014-02-06 DIAGNOSIS — I517 Cardiomegaly: Secondary | ICD-10-CM | POA: Diagnosis not present

## 2014-02-06 DIAGNOSIS — Z87891 Personal history of nicotine dependence: Secondary | ICD-10-CM | POA: Diagnosis not present

## 2014-02-06 DIAGNOSIS — E119 Type 2 diabetes mellitus without complications: Secondary | ICD-10-CM | POA: Insufficient documentation

## 2014-02-06 NOTE — Progress Notes (Signed)
*  PRELIMINARY RESULTS* Echocardiogram 2D Echocardiogram has been performed.  Omar Todd 02/06/2014, 3:03 PM

## 2014-02-18 ENCOUNTER — Other Ambulatory Visit: Payer: Self-pay | Admitting: Family Medicine

## 2014-03-31 ENCOUNTER — Other Ambulatory Visit (INDEPENDENT_AMBULATORY_CARE_PROVIDER_SITE_OTHER): Payer: Medicare Other

## 2014-03-31 DIAGNOSIS — E119 Type 2 diabetes mellitus without complications: Secondary | ICD-10-CM | POA: Diagnosis not present

## 2014-03-31 DIAGNOSIS — I779 Disorder of arteries and arterioles, unspecified: Secondary | ICD-10-CM | POA: Diagnosis not present

## 2014-03-31 DIAGNOSIS — I739 Peripheral vascular disease, unspecified: Principal | ICD-10-CM

## 2014-03-31 DIAGNOSIS — E559 Vitamin D deficiency, unspecified: Secondary | ICD-10-CM | POA: Diagnosis not present

## 2014-03-31 DIAGNOSIS — E785 Hyperlipidemia, unspecified: Secondary | ICD-10-CM

## 2014-03-31 DIAGNOSIS — I10 Essential (primary) hypertension: Secondary | ICD-10-CM

## 2014-03-31 LAB — POCT CBC
Granulocyte percent: 83.3 %G — AB (ref 37–80)
HEMATOCRIT: 40.3 % — AB (ref 43.5–53.7)
HEMOGLOBIN: 13.3 g/dL — AB (ref 14.1–18.1)
Lymph, poc: 1.8 (ref 0.6–3.4)
MCH: 29.5 pg (ref 27–31.2)
MCHC: 33 g/dL (ref 31.8–35.4)
MCV: 89.4 fL (ref 80–97)
MPV: 8 fL (ref 0–99.8)
POC GRANULOCYTE: 10.6 — AB (ref 2–6.9)
POC LYMPH PERCENT: 14.4 %L (ref 10–50)
Platelet Count, POC: 302 10*3/uL (ref 142–424)
RBC: 4.5 M/uL — AB (ref 4.69–6.13)
RDW, POC: 14.4 %
WBC: 12.7 10*3/uL — AB (ref 4.6–10.2)

## 2014-03-31 LAB — POCT GLYCOSYLATED HEMOGLOBIN (HGB A1C): Hemoglobin A1C: 7

## 2014-03-31 LAB — POCT UA - MICROALBUMIN: Microalbumin Ur, POC: NEGATIVE mg/L

## 2014-03-31 NOTE — Progress Notes (Signed)
Patient came in for labs only.

## 2014-04-01 LAB — BMP8+EGFR
BUN/Creatinine Ratio: 17 (ref 10–22)
BUN: 14 mg/dL (ref 8–27)
CO2: 25 mmol/L (ref 18–29)
CREATININE: 0.81 mg/dL (ref 0.76–1.27)
Calcium: 9.6 mg/dL (ref 8.6–10.2)
Chloride: 103 mmol/L (ref 97–108)
GFR calc Af Amer: 109 mL/min/{1.73_m2} (ref >59)
GFR, EST NON AFRICAN AMERICAN: 95 mL/min/{1.73_m2} (ref >59)
Glucose: 168 mg/dL — ABNORMAL HIGH (ref 65–99)
Potassium: 5 mmol/L (ref 3.5–5.2)
Sodium: 141 mmol/L (ref 134–144)

## 2014-04-01 LAB — HEPATIC FUNCTION PANEL
ALBUMIN: 4.1 g/dL (ref 3.6–4.8)
ALT: 9 IU/L (ref 0–44)
AST: 13 IU/L (ref 0–40)
Alkaline Phosphatase: 77 IU/L (ref 39–117)
BILIRUBIN TOTAL: 0.2 mg/dL (ref 0.0–1.2)
Bilirubin, Direct: 0.08 mg/dL (ref 0.00–0.40)
Total Protein: 6.4 g/dL (ref 6.0–8.5)

## 2014-04-01 LAB — NMR, LIPOPROFILE
CHOLESTEROL: 146 mg/dL (ref 100–199)
HDL CHOLESTEROL BY NMR: 45 mg/dL (ref >39)
HDL PARTICLE NUMBER: 32.5 umol/L (ref >=30.5)
LDL Particle Number: 972 nmol/L (ref <1000)
LDL Size: 21 nm (ref >20.5)
LDLC SERPL CALC-MCNC: 77 mg/dL (ref 0–99)
LP-IR Score: 52 — ABNORMAL HIGH (ref <=45)
SMALL LDL PARTICLE NUMBER: 449 nmol/L (ref <=527)
Triglycerides by NMR: 120 mg/dL (ref 0–149)

## 2014-04-01 LAB — VITAMIN D 25 HYDROXY (VIT D DEFICIENCY, FRACTURES): VIT D 25 HYDROXY: 42.4 ng/mL (ref 30.0–100.0)

## 2014-04-12 ENCOUNTER — Other Ambulatory Visit: Payer: Self-pay | Admitting: Family Medicine

## 2014-04-15 NOTE — Telephone Encounter (Signed)
This is okay to refill 

## 2014-04-15 NOTE — Telephone Encounter (Signed)
Last seen 02/03/14  DWM  If approved route to [facility]

## 2014-04-20 ENCOUNTER — Other Ambulatory Visit: Payer: Self-pay | Admitting: Family Medicine

## 2014-04-22 ENCOUNTER — Encounter: Payer: Self-pay | Admitting: *Deleted

## 2014-05-15 ENCOUNTER — Encounter: Payer: Self-pay | Admitting: Nurse Practitioner

## 2014-05-15 ENCOUNTER — Encounter: Payer: Medicare Other | Admitting: Nurse Practitioner

## 2014-05-15 NOTE — Progress Notes (Signed)
   Subjective:    [name], male    DOB: July 22, 1951, 63 y.o.   MRN: [id number]  HPI    Review of Systems     Objective:   Physical Exam        Assessment & Plan:  Just needed form filled out for diabetic shoes

## 2014-06-09 ENCOUNTER — Ambulatory Visit: Payer: Medicare Other | Admitting: Family Medicine

## 2014-06-10 ENCOUNTER — Ambulatory Visit (INDEPENDENT_AMBULATORY_CARE_PROVIDER_SITE_OTHER): Payer: Medicare Other | Admitting: Family Medicine

## 2014-06-10 ENCOUNTER — Encounter: Payer: Self-pay | Admitting: Family Medicine

## 2014-06-10 VITALS — BP 141/76 | HR 68 | Temp 98.7°F | Ht 74.0 in | Wt 217.0 lb

## 2014-06-10 DIAGNOSIS — E559 Vitamin D deficiency, unspecified: Secondary | ICD-10-CM

## 2014-06-10 DIAGNOSIS — E11610 Type 2 diabetes mellitus with diabetic neuropathic arthropathy: Secondary | ICD-10-CM

## 2014-06-10 DIAGNOSIS — I10 Essential (primary) hypertension: Secondary | ICD-10-CM | POA: Diagnosis not present

## 2014-06-10 DIAGNOSIS — E785 Hyperlipidemia, unspecified: Secondary | ICD-10-CM

## 2014-06-10 DIAGNOSIS — E1142 Type 2 diabetes mellitus with diabetic polyneuropathy: Secondary | ICD-10-CM | POA: Diagnosis not present

## 2014-06-10 DIAGNOSIS — J441 Chronic obstructive pulmonary disease with (acute) exacerbation: Secondary | ICD-10-CM

## 2014-06-10 DIAGNOSIS — E1149 Type 2 diabetes mellitus with other diabetic neurological complication: Secondary | ICD-10-CM

## 2014-06-10 DIAGNOSIS — E1140 Type 2 diabetes mellitus with diabetic neuropathy, unspecified: Secondary | ICD-10-CM

## 2014-06-10 DIAGNOSIS — L84 Corns and callosities: Secondary | ICD-10-CM

## 2014-06-10 NOTE — Patient Instructions (Addendum)
Medicare Annual Wellness Visit  [facility] and the medical providers at [facility] strive to bring you the best medical care.  In doing so we not only want to address your current medical conditions and concerns but also to detect new conditions early and prevent illness, disease and health-related problems.    Medicare offers a yearly Wellness Visit which allows our clinical staff to assess your need for preventative services including immunizations, lifestyle education, counseling to decrease risk of preventable diseases and screening for fall risk and other medical concerns.    This visit is provided free of charge (no copay) for all Medicare recipients. The clinical pharmacists at [facility] have begun to conduct these Wellness Visits which will also include a thorough review of all your medications.    As you primary medical provider recommend that you make an appointment for your Annual Wellness Visit if you have not done so already this year.  You may set up this appointment before you leave today or you may call back ([phone]) and schedule an appointment.  Please make sure when you call that you mention that you are scheduling your Annual Wellness Visit with the clinical pharmacist so that the appointment may be made for the proper length of time.     Continue current medications. Continue good therapeutic lifestyle changes which include good diet and exercise. Fall precautions discussed with patient. If an FOBT was given today- please return it to our front desk. If you are over 45 years old - you may need Prevnar 43 or the adult Pneumonia vaccine.  He must keep a return appointment to the podiatrist so he can work on the callus on your right foot Continue to watch her sodium intake Walk as much as possible Wear shoes that give you plenty of space Watch sodium intake

## 2014-06-10 NOTE — Progress Notes (Signed)
Subjective:    [name], male    DOB: [birth year], 63 y.o.   MRN: [id number]  HPI Pt here for follow up and management of chronic medical problems. Patient also wants a form filled out for diabetic shoes. He has seen a podiatrist in the past but not recently        Patient Active Problem List   Diagnosis Date Noted  . Erectile dysfunction 11/05/2013  . Bilateral carotid artery disease 04/11/2013  . hyperlipidemia 04/11/2013  . HTN (hypertension) 04/11/2013  . COPD exacerbation 04/11/2013  . Diabetes mellitus type 2 controlled 04/11/2013  . Diabetic neuropathy, type II diabetes mellitus 03/15/2013   Outpatient Encounter Prescriptions as of 06/10/2014  Medication Sig  . amLODipine (NORVASC) 10 MG tablet Take 1 tablet (10 mg total) by mouth daily.  Marland Kitchen aspirin 325 MG tablet Take 325 mg by mouth daily.  Marland Kitchen BIOTIN 5000 PO Take 1 capsule by mouth daily.  . cholecalciferol (VITAMIN D) 1000 UNITS tablet Take 1,000 Units by mouth daily.  Marland Kitchen glucose blood (ACCU-CHEK ACTIVE STRIPS) test strip Use as instructed  . meloxicam (MOBIC) 7.5 MG tablet TAKE ONE TABLET BY MOUTH TWICE DAILY  . metFORMIN (GLUCOPHAGE) 1000 MG tablet TAKE ONE TABLET TWICE A DAY WITH FOOD  . montelukast (SINGULAIR) 10 MG tablet TAKE ONE TABLET DAILY AT BEDTIME  . pravastatin (PRAVACHOL) 40 MG tablet TAKE ONE (1) TABLET EACH DAY  . ranitidine (ZANTAC) 150 MG tablet TAKE ONE TABLET BY MOUTH TWICE DAILY  . CIALIS 5 MG tablet TAKE ONE (1) TABLET EACH DAY    Review of Systems  Constitutional: Negative.   HENT: Negative.   Eyes: Negative.   Respiratory: Negative.   Cardiovascular: Negative.   Gastrointestinal: Negative.   Endocrine: Negative.   Genitourinary: Negative.   Musculoskeletal: Negative.   Skin: Negative.        Callus on left foot (under lateral part of foot)  Allergic/Immunologic: Negative.   Neurological: Negative.   Hematological: Negative.   Psychiatric/Behavioral: Negative.         Objective:   Physical Exam  Nursing note and vitals reviewed. Constitutional: He is oriented to person, place, and time. He appears well-developed and well-nourished. No distress.  HENT:  Head: Normocephalic and atraumatic.  Right Ear: External ear normal.  Nose: Nose normal.  Mouth/Throat: Oropharynx is clear and moist. No oropharyngeal exudate.  The patient is edentulous both upper and lower .There is ears cerumen bilaterally  Eyes: Conjunctivae and EOM are normal. Pupils are equal, round, and reactive to light. Right eye exhibits no discharge. Left eye exhibits no discharge. No scleral icterus.  Neck: Normal range of motion. Neck supple. No thyromegaly present.  Cardiovascular: Normal rate, regular rhythm and intact distal pulses.  Exam reveals no gallop and no friction rub.   Murmur heard.  There is a grade 2/6 systolic ejection murmur  Pulmonary/Chest: Effort normal and breath sounds normal. No respiratory distress. He has no wheezes. He has no rales. He exhibits no tenderness.  Abdominal: Soft. Bowel sounds are normal. He exhibits no mass. There is no tenderness. There is no rebound and no guarding.  Musculoskeletal: Normal range of motion. He exhibits no edema and no tenderness.  Lymphadenopathy:    He has no cervical adenopathy.  Neurological: He is alert and oriented to person, place, and time. He has normal reflexes. No cranial nerve deficit.  Skin: Skin is warm and dry. No rash noted. No erythema. No pallor.  There is  the prominent callus on the right lateral foot on the plantar surface at the base of the fifth toe  Psychiatric: He has a normal mood and affect. His behavior is normal. Judgment and thought content normal.   BP 141/76  Pulse 68  Temp(Src) 98.7 F (37.1 C) (Oral)  Ht '6\' 2"'  (1.88 m)  Wt 217 lb (98.431 kg)  BMI 27.85 kg/m2  There is decreased sensation on the bottom of the left foot and the left great toe      Assessment & Plan:  1. Diabetic  neuropathy, type II diabetes mellitus - POCT CBC; Future  2. Type 2 diabetes mellitus with diabetic neuropathic arthropathy - POCT CBC; Future - POCT glycosylated hemoglobin (Hb A1C); Future - POCT UA - Microalbumin; Future  3. COPD exacerbation - POCT CBC; Future  4. Essential hypertension - POCT CBC; Future - BMP8+EGFR; Future - Hepatic function panel; Future  5. hyperlipidemia - POCT CBC; Future - NMR, lipoprofile; Future  6. Vitamin D deficiency - POCT CBC; Future - Vit D  25 hydroxy (rtn osteoporosis monitoring); Future  Patient Instructions                       Medicare Annual Wellness Visit  [facility] and the medical providers at [facility] strive to bring you the best medical care.  In doing so we not only want to address your current medical conditions and concerns but also to detect new conditions early and prevent illness, disease and health-related problems.    Medicare offers a yearly Wellness Visit which allows our clinical staff to assess your need for preventative services including immunizations, lifestyle education, counseling to decrease risk of preventable diseases and screening for fall risk and other medical concerns.    This visit is provided free of charge (no copay) for all Medicare recipients. The clinical pharmacists at [facility] have begun to conduct these Wellness Visits which will also include a thorough review of all your medications.    As you primary medical provider recommend that you make an appointment for your Annual Wellness Visit if you have not done so already this year.  You may set up this appointment before you leave today or you may call back ([phone]) and schedule an appointment.  Please make sure when you call that you mention that you are scheduling your Annual Wellness Visit with the clinical pharmacist so that the appointment may be made for the proper length of time.     Continue  current medications. Continue good therapeutic lifestyle changes which include good diet and exercise. Fall precautions discussed with patient. If an FOBT was given today- please return it to our front desk. If you are over 65 years old - you may need Prevnar 40 or the adult Pneumonia vaccine.  He must keep a return appointment to the podiatrist so he can work on the callus on your right foot Continue to watch her sodium intake Walk as much as possible Wear shoes that give you plenty of space Watch sodium intake   Arrie Senate MD

## 2014-06-10 NOTE — Addendum Note (Signed)
Addended by: Zannie Cove on: 06/10/2014 03:30 PM   Modules accepted: Orders

## 2014-06-13 ENCOUNTER — Other Ambulatory Visit: Payer: Self-pay | Admitting: Family Medicine

## 2014-07-03 ENCOUNTER — Other Ambulatory Visit (INDEPENDENT_AMBULATORY_CARE_PROVIDER_SITE_OTHER): Payer: Medicare Other

## 2014-07-03 DIAGNOSIS — E1142 Type 2 diabetes mellitus with diabetic polyneuropathy: Secondary | ICD-10-CM | POA: Diagnosis not present

## 2014-07-03 DIAGNOSIS — E11610 Type 2 diabetes mellitus with diabetic neuropathic arthropathy: Secondary | ICD-10-CM

## 2014-07-03 DIAGNOSIS — E1140 Type 2 diabetes mellitus with diabetic neuropathy, unspecified: Secondary | ICD-10-CM

## 2014-07-03 DIAGNOSIS — E785 Hyperlipidemia, unspecified: Secondary | ICD-10-CM

## 2014-07-03 DIAGNOSIS — E1149 Type 2 diabetes mellitus with other diabetic neurological complication: Secondary | ICD-10-CM | POA: Diagnosis not present

## 2014-07-03 DIAGNOSIS — E559 Vitamin D deficiency, unspecified: Secondary | ICD-10-CM | POA: Diagnosis not present

## 2014-07-03 DIAGNOSIS — J441 Chronic obstructive pulmonary disease with (acute) exacerbation: Secondary | ICD-10-CM | POA: Diagnosis not present

## 2014-07-03 DIAGNOSIS — I10 Essential (primary) hypertension: Secondary | ICD-10-CM

## 2014-07-03 LAB — POCT CBC
GRANULOCYTE PERCENT: 80 % (ref 37–80)
HCT, POC: 38.2 % — AB (ref 43.5–53.7)
Hemoglobin: 12.8 g/dL — AB (ref 14.1–18.1)
Lymph, poc: 1.6 (ref 0.6–3.4)
MCH, POC: 30.3 pg (ref 27–31.2)
MCHC: 33.6 g/dL (ref 31.8–35.4)
MCV: 90.1 fL (ref 80–97)
MPV: 6.9 fL (ref 0–99.8)
POC GRANULOCYTE: 7.5 — AB (ref 2–6.9)
POC LYMPH PERCENT: 17.5 %L (ref 10–50)
Platelet Count, POC: 323 10*3/uL (ref 142–424)
RBC: 4.2 M/uL — AB (ref 4.69–6.13)
RDW, POC: 13.7 %
WBC: 9.4 10*3/uL (ref 4.6–10.2)

## 2014-07-03 LAB — POCT UA - MICROALBUMIN: Microalbumin Ur, POC: NEGATIVE mg/L

## 2014-07-03 LAB — POCT GLYCOSYLATED HEMOGLOBIN (HGB A1C): HEMOGLOBIN A1C: 7.1

## 2014-07-03 NOTE — Progress Notes (Signed)
Pt came in for lab  only 

## 2014-07-04 LAB — BMP8+EGFR
BUN/Creatinine Ratio: 19 (ref 10–22)
BUN: 16 mg/dL (ref 8–27)
CO2: 24 mmol/L (ref 18–29)
Calcium: 9.4 mg/dL (ref 8.6–10.2)
Chloride: 98 mmol/L (ref 97–108)
Creatinine, Ser: 0.83 mg/dL (ref 0.76–1.27)
GFR, EST AFRICAN AMERICAN: 108 mL/min/{1.73_m2} (ref >59)
GFR, EST NON AFRICAN AMERICAN: 94 mL/min/{1.73_m2} (ref >59)
Glucose: 135 mg/dL — ABNORMAL HIGH (ref 65–99)
Potassium: 4.6 mmol/L (ref 3.5–5.2)
Sodium: 138 mmol/L (ref 134–144)

## 2014-07-04 LAB — NMR, LIPOPROFILE
Cholesterol: 157 mg/dL (ref 100–199)
HDL CHOLESTEROL BY NMR: 49 mg/dL (ref >39)
HDL PARTICLE NUMBER: 28.2 umol/L — AB (ref >=30.5)
LDL Particle Number: 1336 nmol/L — ABNORMAL HIGH (ref <1000)
LDL Size: 20.6 nm (ref >20.5)
LDLC SERPL CALC-MCNC: 93 mg/dL (ref 0–99)
LP-IR Score: 33 (ref <=45)
SMALL LDL PARTICLE NUMBER: 765 nmol/L — AB (ref <=527)
Triglycerides by NMR: 75 mg/dL (ref 0–149)

## 2014-07-04 LAB — HEPATIC FUNCTION PANEL
ALBUMIN: 4.1 g/dL (ref 3.6–4.8)
ALT: 6 IU/L (ref 0–44)
AST: 11 IU/L (ref 0–40)
Alkaline Phosphatase: 63 IU/L (ref 39–117)
BILIRUBIN DIRECT: 0.09 mg/dL (ref 0.00–0.40)
BILIRUBIN TOTAL: 0.3 mg/dL (ref 0.0–1.2)
Total Protein: 6.2 g/dL (ref 6.0–8.5)

## 2014-07-04 LAB — VITAMIN D 25 HYDROXY (VIT D DEFICIENCY, FRACTURES): VIT D 25 HYDROXY: 49.4 ng/mL (ref 30.0–100.0)

## 2014-07-14 ENCOUNTER — Other Ambulatory Visit: Payer: Self-pay | Admitting: Family Medicine

## 2014-08-13 ENCOUNTER — Other Ambulatory Visit: Payer: Self-pay | Admitting: Family Medicine

## 2014-08-25 ENCOUNTER — Ambulatory Visit (INDEPENDENT_AMBULATORY_CARE_PROVIDER_SITE_OTHER): Payer: Medicare Other

## 2014-08-25 DIAGNOSIS — Z23 Encounter for immunization: Secondary | ICD-10-CM | POA: Diagnosis not present

## 2014-08-26 ENCOUNTER — Ambulatory Visit: Payer: Medicare Other

## 2014-09-16 ENCOUNTER — Encounter: Payer: Self-pay | Admitting: Family Medicine

## 2014-09-16 ENCOUNTER — Ambulatory Visit (INDEPENDENT_AMBULATORY_CARE_PROVIDER_SITE_OTHER): Payer: Medicare Other | Admitting: Family Medicine

## 2014-09-16 VITALS — BP 155/81 | HR 79 | Temp 98.8°F | Ht 74.0 in | Wt 223.0 lb

## 2014-09-16 DIAGNOSIS — J069 Acute upper respiratory infection, unspecified: Secondary | ICD-10-CM

## 2014-09-16 MED ORDER — METHYLPREDNISOLONE ACETATE 80 MG/ML IJ SUSP
80.0000 mg | Freq: Once | INTRAMUSCULAR | Status: AC
  Administered 2014-09-16: 80 mg via INTRAMUSCULAR

## 2014-09-16 NOTE — Progress Notes (Signed)
   Subjective:    [name], male    DOB: [birth year], 63 y.o.   MRN: [id number]  HPI C/o uri sx's   Review of Systems    No chest pain, SOB, HA, dizziness, vision change, N/V, diarrhea, constipation, dysuria, urinary urgency or frequency, myalgias, arthralgias or rash.  Objective:   Physical Exam  Vital signs noted  Well developed well nourished male.  HEENT - Head atraumatic Normocephalic                Eyes - PERRLA, Conjuctiva - clear Sclera- Clear EOMI                Ears - EAC's Wnl TM's Wnl Gross Hearing WNL                Nose - Nares patent                 Throat - oropharanx wnl Respiratory - Lungs CTA bilateral Cardiac - RRR S1 and S2 without murmur GI - Abdomen soft Nontender and bowel sounds active x 4 Extremities - No edema. Neuro - Grossly intact.      Assessment & Plan:  Acute upper respiratory infection - Plan: methylPREDNISolone acetate (DEPO-MEDROL) injection 80 mg  Push po fluids, rest, tylenol and motrin otc prn as directed for fever, arthralgias, and myalgias.  Follow up prn if sx's continue or persist.  Lysbeth Penner FNP

## 2014-10-10 ENCOUNTER — Other Ambulatory Visit: Payer: Self-pay | Admitting: Family Medicine

## 2014-10-31 ENCOUNTER — Encounter: Payer: Self-pay | Admitting: Family Medicine

## 2014-10-31 ENCOUNTER — Ambulatory Visit (INDEPENDENT_AMBULATORY_CARE_PROVIDER_SITE_OTHER): Payer: Medicare Other

## 2014-10-31 ENCOUNTER — Ambulatory Visit (INDEPENDENT_AMBULATORY_CARE_PROVIDER_SITE_OTHER): Payer: Medicare Other | Admitting: Family Medicine

## 2014-10-31 VITALS — BP 147/84 | HR 82 | Temp 98.0°F | Ht 74.0 in | Wt 213.0 lb

## 2014-10-31 DIAGNOSIS — J441 Chronic obstructive pulmonary disease with (acute) exacerbation: Secondary | ICD-10-CM | POA: Diagnosis not present

## 2014-10-31 DIAGNOSIS — Z125 Encounter for screening for malignant neoplasm of prostate: Secondary | ICD-10-CM

## 2014-10-31 DIAGNOSIS — I10 Essential (primary) hypertension: Secondary | ICD-10-CM

## 2014-10-31 DIAGNOSIS — M25512 Pain in left shoulder: Secondary | ICD-10-CM

## 2014-10-31 DIAGNOSIS — E1140 Type 2 diabetes mellitus with diabetic neuropathy, unspecified: Secondary | ICD-10-CM | POA: Diagnosis not present

## 2014-10-31 DIAGNOSIS — N400 Enlarged prostate without lower urinary tract symptoms: Secondary | ICD-10-CM

## 2014-10-31 DIAGNOSIS — R011 Cardiac murmur, unspecified: Secondary | ICD-10-CM | POA: Diagnosis not present

## 2014-10-31 DIAGNOSIS — E11610 Type 2 diabetes mellitus with diabetic neuropathic arthropathy: Secondary | ICD-10-CM

## 2014-10-31 DIAGNOSIS — E559 Vitamin D deficiency, unspecified: Secondary | ICD-10-CM | POA: Diagnosis not present

## 2014-10-31 DIAGNOSIS — R0989 Other specified symptoms and signs involving the circulatory and respiratory systems: Secondary | ICD-10-CM | POA: Diagnosis not present

## 2014-10-31 DIAGNOSIS — D179 Benign lipomatous neoplasm, unspecified: Secondary | ICD-10-CM

## 2014-10-31 DIAGNOSIS — M1460 Charcot's joint, unspecified site: Secondary | ICD-10-CM | POA: Diagnosis not present

## 2014-10-31 LAB — POCT URINALYSIS DIPSTICK
BILIRUBIN UA: NEGATIVE
Blood, UA: NEGATIVE
Glucose, UA: NEGATIVE
KETONES UA: NEGATIVE
LEUKOCYTES UA: NEGATIVE
Nitrite, UA: NEGATIVE
Protein, UA: NEGATIVE
Spec Grav, UA: 1.015
Urobilinogen, UA: NEGATIVE
pH, UA: 5

## 2014-10-31 LAB — POCT CBC
GRANULOCYTE PERCENT: 74.7 % (ref 37–80)
HEMATOCRIT: 39.1 % — AB (ref 43.5–53.7)
Hemoglobin: 12.9 g/dL — AB (ref 14.1–18.1)
Lymph, poc: 1.7 (ref 0.6–3.4)
MCH, POC: 29.4 pg (ref 27–31.2)
MCHC: 33 g/dL (ref 31.8–35.4)
MCV: 89.2 fL (ref 80–97)
MPV: 7.4 fL (ref 0–99.8)
POC GRANULOCYTE: 6 (ref 2–6.9)
POC LYMPH PERCENT: 21.3 %L (ref 10–50)
Platelet Count, POC: 291 10*3/uL (ref 142–424)
RBC: 4.4 M/uL — AB (ref 4.69–6.13)
RDW, POC: 14.1 %
WBC: 8 10*3/uL (ref 4.6–10.2)

## 2014-10-31 LAB — POCT UA - MICROSCOPIC ONLY
Bacteria, U Microscopic: NEGATIVE
Casts, Ur, LPF, POC: NEGATIVE
Crystals, Ur, HPF, POC: NEGATIVE
Mucus, UA: NEGATIVE
YEAST UA: NEGATIVE

## 2014-10-31 LAB — POCT GLYCOSYLATED HEMOGLOBIN (HGB A1C): HEMOGLOBIN A1C: 7.4

## 2014-10-31 IMAGING — CR DG SHOULDER 2+V*L*
3 series · 3 of 3 positions shown · non-contrast
Comparison: None.

CLINICAL DATA: Left shoulder pain.

EXAM:
LEFT SHOULDER - 2+ VIEW

[view not recorded (1 of 3)]
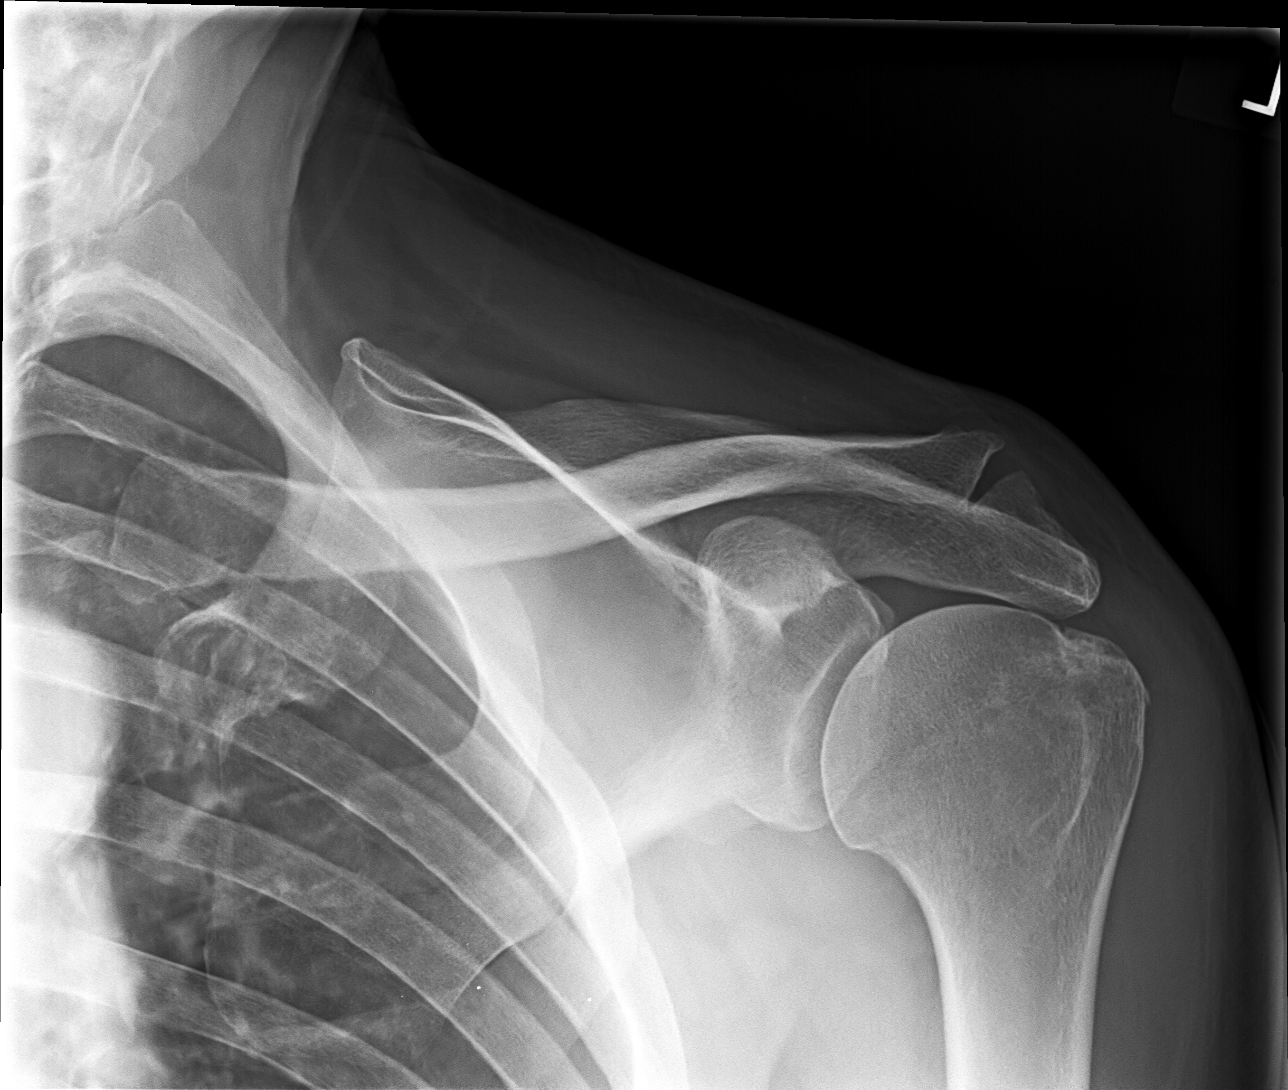

[view not recorded (2 of 3)]
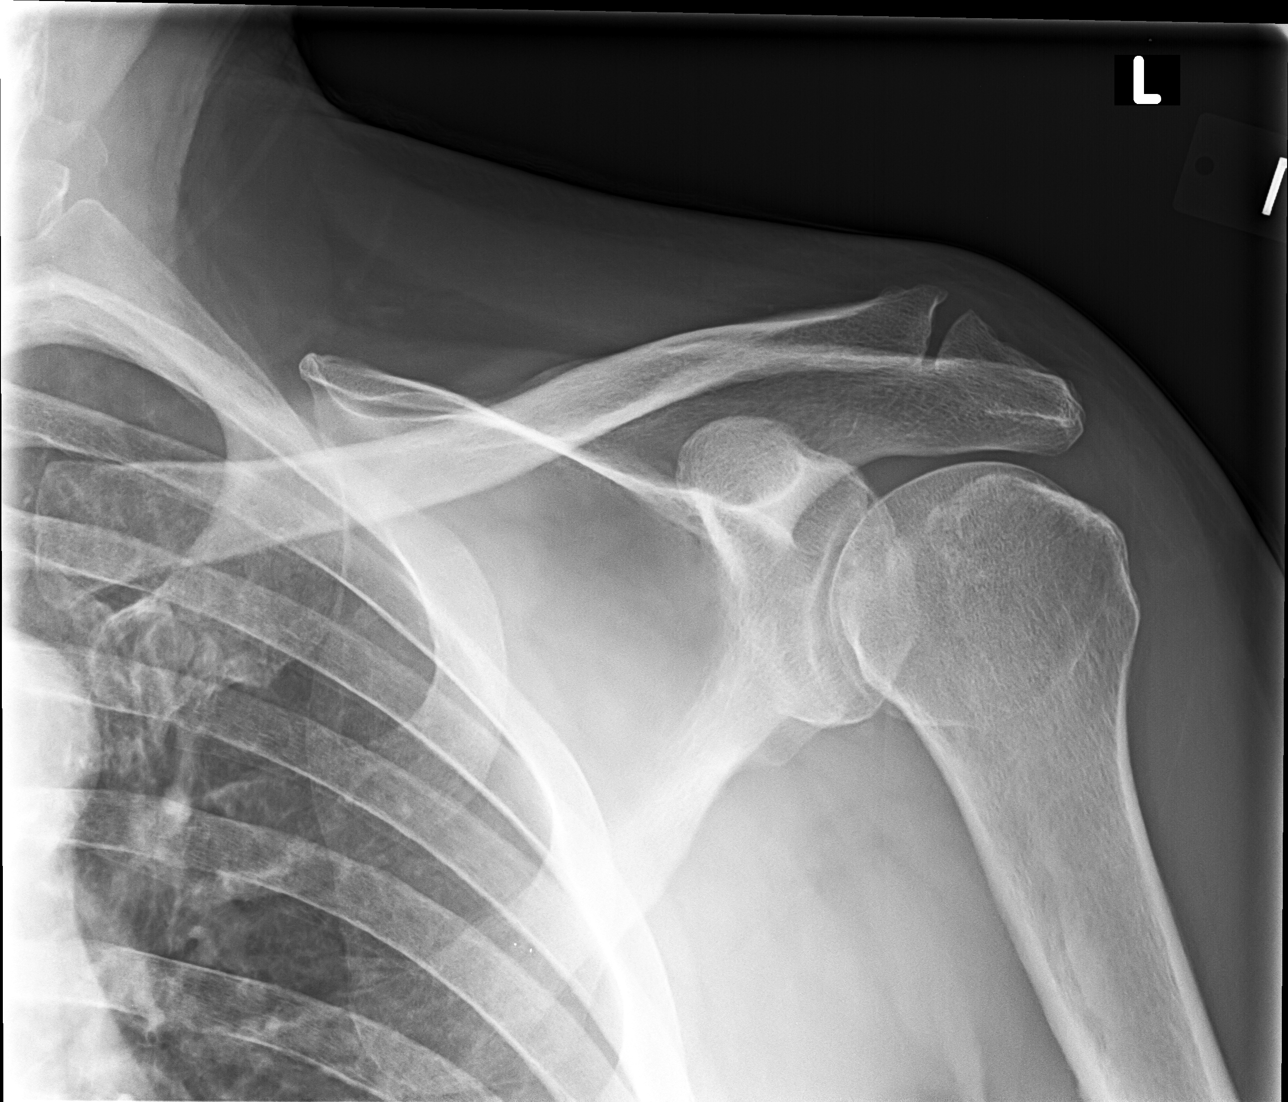

[view not recorded (3 of 3)]
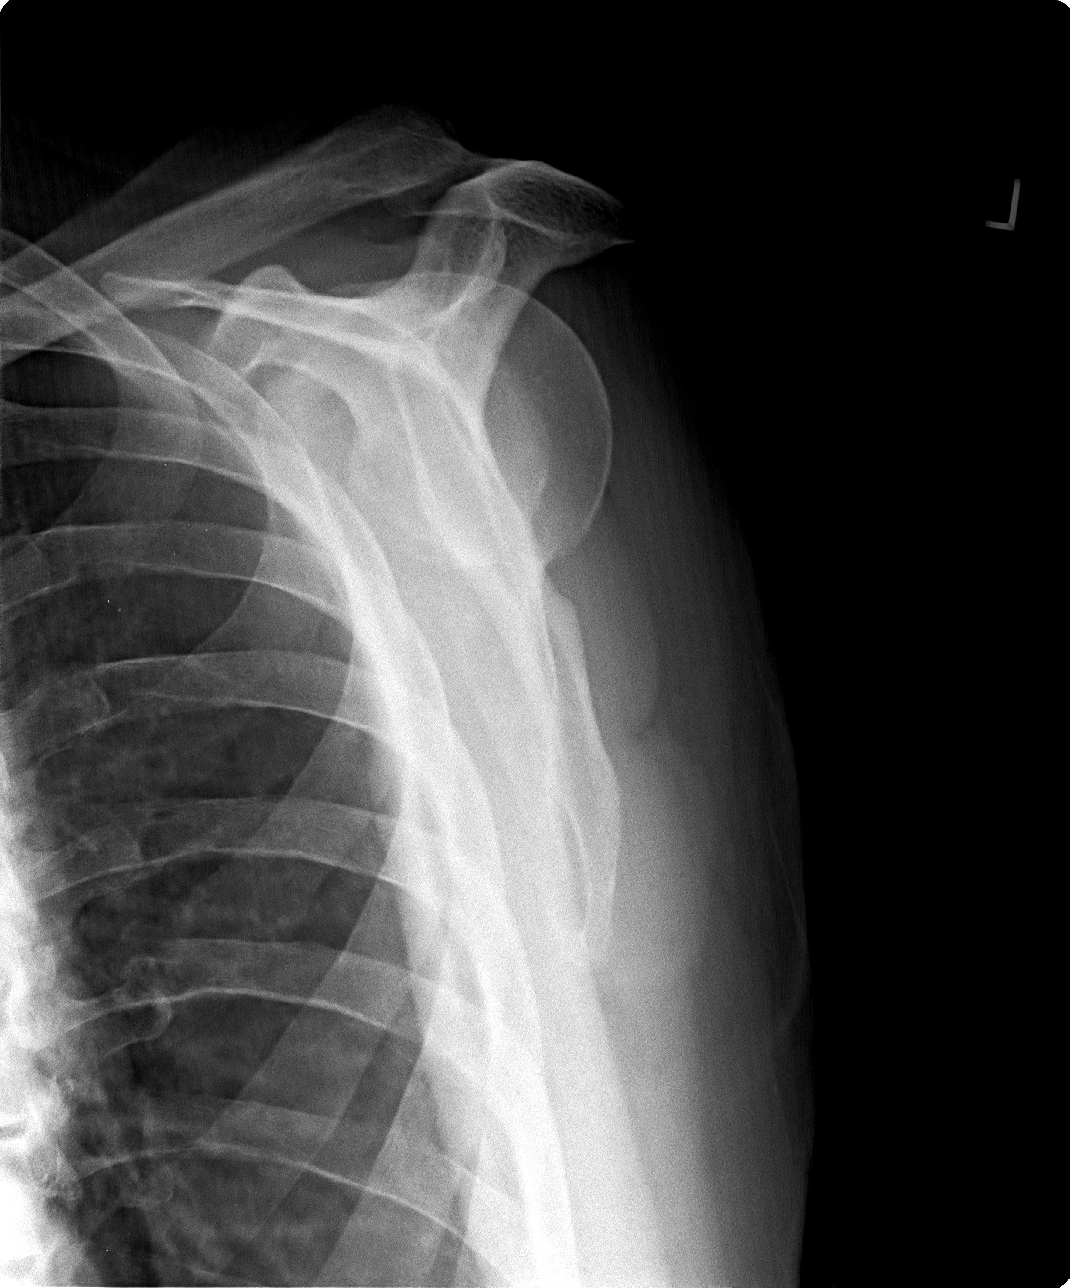

[3 of 3 positions shown; findings below may reference images not displayed]

FINDINGS: There is no fracture or dislocation. There are minimal degenerative
changes at the acromioclavicular joint. Glenohumeral joint appears
normal. No soft tissue calcifications.
IMPRESSION: No significant abnormalities.

## 2014-10-31 NOTE — Patient Instructions (Addendum)
Medicare Annual Wellness Visit  [facility] and the medical providers at [facility] strive to bring you the best medical care.  In doing so we not only want to address your current medical conditions and concerns but also to detect new conditions early and prevent illness, disease and health-related problems.    Medicare offers a yearly Wellness Visit which allows our clinical staff to assess your need for preventative services including immunizations, lifestyle education, counseling to decrease risk of preventable diseases and screening for fall risk and other medical concerns.    This visit is provided free of charge (no copay) for all Medicare recipients. The clinical pharmacists at [facility] have begun to conduct these Wellness Visits which will also include a thorough review of all your medications.    As you primary medical provider recommend that you make an appointment for your Annual Wellness Visit if you have not done so already this year.  You may set up this appointment before you leave today or you may call back ([phone]) and schedule an appointment.  Please make sure when you call that you mention that you are scheduling your Annual Wellness Visit with the clinical pharmacist so that the appointment may be made for the proper length of time.     Continue current medications. Continue good therapeutic lifestyle changes which include good diet and exercise. Fall precautions discussed with patient. If an FOBT was given today- please return it to our front desk. If you are over 17 years old - you may need Prevnar 75 or the adult Pneumonia vaccine.  Flu Shots will be available at our office starting mid- September. Please call and schedule a FLU CLINIC APPOINTMENT.   Use warm wet compresses to left shoulder 20 minutes 3 or 4 times daily Do range of motion exercises Take Tylenol as needed for pain We will call you  once we get the results back from the official radiology reading DeBrox eardrops over-the-counter can help dissolve some of the ears cerumen in the left ear canal a few will use this regularly for several nights

## 2014-10-31 NOTE — Progress Notes (Signed)
Subjective:    [name], male    DOB: 29-Jul-1951, 63 y.o.   MRN: [id number]  HPI Pt here for follow up and management of chronic medical problems. She complains of pain in the left shoulder and a knot or a lump on his left temple.   Patient Active Problem List   Diagnosis Date Noted  . Erectile dysfunction 11/05/2013  . Bilateral carotid artery disease 04/11/2013  . hyperlipidemia 04/11/2013  . HTN (hypertension) 04/11/2013  . COPD exacerbation 04/11/2013  . Diabetes mellitus type 2 controlled 04/11/2013  . Diabetic neuropathy, type II diabetes mellitus 03/15/2013   Outpatient Encounter Prescriptions as of 10/31/2014  Medication Sig  . amLODipine (NORVASC) 10 MG tablet TAKE ONE (1) TABLET EACH DAY  . aspirin 325 MG tablet Take 325 mg by mouth daily.  Marland Kitchen BIOTIN 5000 PO Take 1 capsule by mouth daily.  . cholecalciferol (VITAMIN D) 1000 UNITS tablet Take 1,000 Units by mouth daily.  Marland Kitchen CIALIS 5 MG tablet TAKE ONE (1) TABLET EACH DAY  . glucose blood (ACCU-CHEK ACTIVE STRIPS) test strip Use as instructed  . meloxicam (MOBIC) 7.5 MG tablet TAKE ONE TABLET BY MOUTH TWICE DAILY  . metFORMIN (GLUCOPHAGE) 1000 MG tablet TAKE ONE TABLET TWICE A DAY WITH FOOD  . montelukast (SINGULAIR) 10 MG tablet TAKE ONE TABLET DAILY AT BEDTIME  . pravastatin (PRAVACHOL) 40 MG tablet TAKE ONE (1) TABLET EACH DAY  . ranitidine (ZANTAC) 150 MG tablet TAKE ONE TABLET BY MOUTH TWICE DAILY  . VENTOLIN HFA 108 (90 BASE) MCG/ACT inhaler USE 2 PUFFS EVERY 6 HOURS AS NEEDED    Review of Systems  Constitutional: Negative.   HENT: Negative.   Eyes: Negative.   Respiratory: Negative.   Cardiovascular: Negative.   Gastrointestinal: Negative.   Endocrine: Negative.   Genitourinary: Negative.   Musculoskeletal: Positive for arthralgias (left shoulder pain).  Skin: Negative.        Knot on right temple - area  Allergic/Immunologic: Negative.   Neurological: Negative.   Hematological:  Negative.   Psychiatric/Behavioral: Negative.        Objective:   Physical Exam  Constitutional: He is oriented to person, place, and time. He appears well-developed and well-nourished. No distress.  HENT:  Head: Normocephalic and atraumatic.  Right Ear: External ear normal.  Left Ear: External ear normal.  Nose: Nose normal.  Mouth/Throat: Oropharynx is clear and moist. No oropharyngeal exudate.  He has a cerumen in the left ear canal .The patient is edentulous both upper and lower.  Eyes: Conjunctivae and EOM are normal. Pupils are equal, round, and reactive to light. Right eye exhibits no discharge. Left eye exhibits no discharge. No scleral icterus.  Neck: Normal range of motion. Neck supple. No thyromegaly present.  There are bilateral carotid bruits  Cardiovascular: Normal rate, regular rhythm and intact distal pulses.  Exam reveals no gallop and no friction rub.   Murmur heard. There is a grade 2/6 systolic ejection murmur with a rate of 72/m  Pulmonary/Chest: Effort normal and breath sounds normal. No respiratory distress. He has no wheezes. He has no rales. He exhibits no tenderness.  There is no wheezing and breath sounds were good bilaterally  Abdominal: Soft. Bowel sounds are normal. He exhibits no mass. There is no tenderness. There is no rebound and no guarding.  Genitourinary: Rectum normal and penis normal. No penile tenderness.  The prostate gland is enlarged but soft and smooth without masses. There were no rectal masses.  There was no inguinal hernia palpable. There were no inguinal nodes. The testicles were normal.  Musculoskeletal: Normal range of motion. He exhibits no edema or tenderness.  Lymphadenopathy:    He has no cervical adenopathy.  Neurological: He is alert and oriented to person, place, and time. He has normal reflexes. No cranial nerve deficit.  Skin: Skin is warm and dry. No rash noted. No erythema. No pallor.  Psychiatric: He has a normal mood and  affect. His behavior is normal. Judgment and thought content normal.  Nursing note and vitals reviewed.  BP 147/84 mmHg  Pulse 82  Temp(Src) 98 F (36.7 C) (Oral)  Ht '6\' 2"'  (1.88 m)  Wt 213 lb (96.616 kg)  BMI 27.34 kg/m2  [facility] reading (PRIMARY) by  Dr. Wonda Horner acute finding on left shoulder films                                       Assessment & Plan:  1. Diabetic neuropathy, type II diabetes mellitus - POCT CBC - POCT glycosylated hemoglobin (Hb A1C)  2. Type 2 diabetes mellitus with diabetic neuropathic arthropathy - POCT CBC - POCT glycosylated hemoglobin (Hb A1C) - BMP8+EGFR - NMR, lipoprofile - Urine culture - POCT UA - Microscopic Only - POCT urinalysis dipstick  3. COPD exacerbation - POCT CBC  4. Essential hypertension - POCT CBC - BMP8+EGFR - Hepatic function panel - NMR, lipoprofile  5. Vitamin D deficiency - POCT CBC - Vit D  25 hydroxy (rtn osteoporosis monitoring)  6. Prostate cancer screening - POCT CBC - PSA, total and free - Urine culture - POCT UA - Microscopic Only - POCT urinalysis dipstick  7. Left shoulder pain - DG Shoulder Left; Future  8. BPH (benign prostatic hyperplasia)  9. Bilateral carotid bruits  10. Systolic ejection murmur  11. Fatty tumor  Patient Instructions                       Medicare Annual Wellness Visit  [facility] and the medical providers at [facility] strive to bring you the best medical care.  In doing so we not only want to address your current medical conditions and concerns but also to detect new conditions early and prevent illness, disease and health-related problems.    Medicare offers a yearly Wellness Visit which allows our clinical staff to assess your need for preventative services including immunizations, lifestyle education, counseling to decrease risk of preventable diseases and screening for fall risk and other medical concerns.    This visit is provided free of  charge (no copay) for all Medicare recipients. The clinical pharmacists at [facility] have begun to conduct these Wellness Visits which will also include a thorough review of all your medications.    As you primary medical provider recommend that you make an appointment for your Annual Wellness Visit if you have not done so already this year.  You may set up this appointment before you leave today or you may call back ([phone]) and schedule an appointment.  Please make sure when you call that you mention that you are scheduling your Annual Wellness Visit with the clinical pharmacist so that the appointment may be made for the proper length of time.     Continue current medications. Continue good therapeutic lifestyle changes which include good diet and exercise. Fall precautions discussed with  patient. If an FOBT was given today- please return it to our front desk. If you are over 13 years old - you may need Prevnar 69 or the adult Pneumonia vaccine.  Flu Shots will be available at our office starting mid- September. Please call and schedule a FLU CLINIC APPOINTMENT.   Use warm wet compresses to left shoulder 20 minutes 3 or 4 times daily Do range of motion exercises Take Tylenol as needed for pain We will call you once we get the results back from the official radiology reading DeBrox eardrops over-the-counter can help dissolve some of the ears cerumen in the left ear canal a few will use this regularly for several nights   Arrie Senate MD

## 2014-11-01 LAB — BMP8+EGFR
BUN/Creatinine Ratio: 30 — ABNORMAL HIGH (ref 10–22)
BUN: 22 mg/dL (ref 8–27)
CALCIUM: 9.5 mg/dL (ref 8.6–10.2)
CHLORIDE: 102 mmol/L (ref 97–108)
CO2: 22 mmol/L (ref 18–29)
Creatinine, Ser: 0.73 mg/dL — ABNORMAL LOW (ref 0.76–1.27)
GFR calc Af Amer: 114 mL/min/{1.73_m2} (ref >59)
GFR, EST NON AFRICAN AMERICAN: 99 mL/min/{1.73_m2} (ref >59)
GLUCOSE: 175 mg/dL — AB (ref 65–99)
POTASSIUM: 4.9 mmol/L (ref 3.5–5.2)
Sodium: 139 mmol/L (ref 134–144)

## 2014-11-01 LAB — NMR, LIPOPROFILE
CHOLESTEROL: 148 mg/dL (ref 100–199)
HDL Cholesterol by NMR: 52 mg/dL (ref >39)
HDL PARTICLE NUMBER: 29.3 umol/L — AB (ref >=30.5)
LDL Particle Number: 985 nmol/L (ref <1000)
LDL Size: 20.7 nm (ref >20.5)
LDL-C: 74 mg/dL (ref 0–99)
LP-IR SCORE: 47 — AB (ref <=45)
Small LDL Particle Number: 441 nmol/L (ref <=527)
TRIGLYCERIDES BY NMR: 108 mg/dL (ref 0–149)

## 2014-11-01 LAB — HEPATIC FUNCTION PANEL
ALBUMIN: 4.2 g/dL (ref 3.6–4.8)
ALK PHOS: 63 IU/L (ref 39–117)
ALT: 8 IU/L (ref 0–44)
AST: 9 IU/L (ref 0–40)
BILIRUBIN DIRECT: 0.09 mg/dL (ref 0.00–0.40)
TOTAL PROTEIN: 6.2 g/dL (ref 6.0–8.5)
Total Bilirubin: <0.2 mg/dL (ref 0.0–1.2)

## 2014-11-01 LAB — PSA, TOTAL AND FREE
PSA, Free Pct: 27.1 %
PSA, Free: 0.19 ng/mL
PSA: 0.7 ng/mL (ref 0.0–4.0)

## 2014-11-01 LAB — VITAMIN D 25 HYDROXY (VIT D DEFICIENCY, FRACTURES): Vit D, 25-Hydroxy: 50.7 ng/mL (ref 30.0–100.0)

## 2014-11-02 LAB — URINE CULTURE

## 2014-11-04 ENCOUNTER — Telehealth: Payer: Self-pay | Admitting: *Deleted

## 2014-11-04 NOTE — Telephone Encounter (Signed)
lmtcb 12/15-kc

## 2014-11-04 NOTE — Telephone Encounter (Signed)
-----   Message from Chipper Herb, MD sent at 11/02/2014  1:08 PM EST ----- The blood sugar is elevated at 175. The creatinine, the most important kidney function test is within normal limits. The electrolytes including potassium are within normal limits All liver function tests are within normal limits Cholesterol numbers with advanced lipid testing are improved since 4 months ago. The total LDL particle number is now 985 and is at goal of less than 1000. The triglycerides are good and the LDL C is good. The patient should continue with his current treatment and with as aggressive therapeutic lifestyle changes as possible which include diet and exercise. The PSA is low and within normal limits. A urine culture had mixed bacterial growth with no specific agent to treat. The vitamin D level is good at 50.7, continue current treatment

## 2014-11-07 ENCOUNTER — Other Ambulatory Visit: Payer: Self-pay | Admitting: Family Medicine

## 2014-11-10 ENCOUNTER — Other Ambulatory Visit: Payer: Self-pay | Admitting: *Deleted

## 2014-11-10 MED ORDER — AMLODIPINE BESYLATE 10 MG PO TABS: ORAL_TABLET | ORAL | Status: DC

## 2014-11-10 MED ORDER — METFORMIN HCL 1000 MG PO TABS: ORAL_TABLET | ORAL | Status: DC

## 2014-12-05 ENCOUNTER — Other Ambulatory Visit: Payer: Self-pay | Admitting: Family Medicine

## 2015-01-02 ENCOUNTER — Other Ambulatory Visit: Payer: Self-pay | Admitting: Family Medicine

## 2015-01-23 ENCOUNTER — Ambulatory Visit (INDEPENDENT_AMBULATORY_CARE_PROVIDER_SITE_OTHER): Payer: Medicare Other | Admitting: Physician Assistant

## 2015-01-23 ENCOUNTER — Encounter: Payer: Self-pay | Admitting: Physician Assistant

## 2015-01-23 VITALS — BP 142/72 | HR 70 | Temp 97.6°F | Ht 74.0 in | Wt 214.4 lb

## 2015-01-23 DIAGNOSIS — J302 Other seasonal allergic rhinitis: Secondary | ICD-10-CM | POA: Diagnosis not present

## 2015-01-23 DIAGNOSIS — J0111 Acute recurrent frontal sinusitis: Secondary | ICD-10-CM | POA: Diagnosis not present

## 2015-01-23 MED ORDER — AZITHROMYCIN 250 MG PO TABS: ORAL_TABLET | ORAL | Status: DC

## 2015-01-23 MED ORDER — FLUTICASONE PROPIONATE 50 MCG/ACT NA SUSP: 2.0000 | Freq: Every day | NASAL | Status: DC

## 2015-01-30 ENCOUNTER — Other Ambulatory Visit: Payer: Self-pay | Admitting: Family Medicine

## 2015-02-09 NOTE — Progress Notes (Signed)
   Subjective:    [name], male    DOB: Aug 28, 1951, 64 y.o.   MRN: [id number]  HPI 64 y/o male with comorbid DM, CAD, COPD presents with cough, nasal congestion x 5 days. Wife has similar symptoms.    Review of Systems  Constitutional: Positive for chills and fatigue.  HENT: Positive for congestion, postnasal drip, rhinorrhea, sinus pressure, sneezing and sore throat. Negative for ear pain.   Respiratory: Positive for cough (productive ). Negative for shortness of breath and wheezing.   Cardiovascular: Negative.        Objective:   Physical Exam  Constitutional: He appears well-developed and well-nourished. No distress.  HENT:  Right Ear: External ear normal.  Left Ear: External ear normal.  TTP of maxillary /frontal sinuses  Cardiovascular: Normal rate and regular rhythm.   Murmur heard. Pulmonary/Chest: No respiratory distress. He has no wheezes. He has no rales.  Skin: He is not diaphoretic.  Vitals reviewed.         Assessment & Plan:  1. COPD exacerbation: Continue inhalers as prescribed on a regular basis. Azithromycin 250mg  as directed. F/U if s/s worsen or don not improve.

## 2015-02-27 ENCOUNTER — Other Ambulatory Visit: Payer: Self-pay | Admitting: Family Medicine

## 2015-03-16 ENCOUNTER — Ambulatory Visit: Payer: Medicare Other | Admitting: Family Medicine

## 2015-03-29 ENCOUNTER — Other Ambulatory Visit: Payer: Self-pay | Admitting: Family Medicine

## 2015-04-24 ENCOUNTER — Ambulatory Visit (INDEPENDENT_AMBULATORY_CARE_PROVIDER_SITE_OTHER): Payer: Medicare Other | Admitting: Physician Assistant

## 2015-04-24 ENCOUNTER — Encounter: Payer: Self-pay | Admitting: Physician Assistant

## 2015-04-24 VITALS — BP 147/81 | HR 65 | Temp 98.6°F

## 2015-04-24 DIAGNOSIS — E11610 Type 2 diabetes mellitus with diabetic neuropathic arthropathy: Secondary | ICD-10-CM

## 2015-04-24 DIAGNOSIS — I10 Essential (primary) hypertension: Secondary | ICD-10-CM | POA: Diagnosis not present

## 2015-04-24 LAB — POCT GLYCOSYLATED HEMOGLOBIN (HGB A1C): Hemoglobin A1C: 7.8

## 2015-04-24 NOTE — Progress Notes (Signed)
Subjective:     [name], male   DOB: Dec 11, 1950, 64 y.o.   MRN: [id number]  HPI Pt here for f/u of NIDDM It has been ~ 6      months since last visit He has ben trying to watch his diet and is walking daily He is doing nightly foot checks Denies any polyuria, weight loss or change in vision Last eye appt ~ 1 yr ago  Review of Systems  Constitutional: Negative.   HENT: Negative.   Respiratory: Negative.   Cardiovascular: Negative.   Gastrointestinal: Negative.   Endocrine: Negative.        Objective:   Physical Exam  Constitutional: He appears well-developed and well-nourished.  HENT:  Mouth/Throat: Oropharynx is clear and moist. No oropharyngeal exudate.  Neck: Neck supple. No JVD present.  Cardiovascular: Normal rate and regular rhythm.   Murmur heard. 2/6 sys ej murmur  Pulmonary/Chest: Effort normal. He has rales.  Musculoskeletal:  No lower ext edema or ulcerations Nails well manicured  Lymphadenopathy:    He has no cervical adenopathy.  Nursing note and vitals reviewed.      Assessment:     Essential hypertension - Plan: CMP14+EGFR, POCT glycosylated hemoglobin (Hb A1C)  Type 2 diabetes mellitus with diabetic neuropathic arthropathy - Plan: CMP14+EGFR, POCT glycosylated hemoglobin (Hb A1C)      Plan:     Continue with his current exercise program Continue with all meds Pt needing eye exam Watch diet Recheck in 3 months with [facility]

## 2015-04-24 NOTE — Patient Instructions (Signed)
Diabetes and Foot Care Diabetes may cause you to have problems because of poor blood supply (circulation) to your feet and legs. This may cause the skin on your feet to become thinner, break easier, and heal more slowly. Your skin may become dry, and the skin may peel and crack. You may also have nerve damage in your legs and feet causing decreased feeling in them. You may not notice minor injuries to your feet that could lead to infections or more serious problems. Taking care of your feet is one of the most important things you can do for yourself.  HOME CARE INSTRUCTIONS  Wear shoes at all times, even in the house. Do not go barefoot. Bare feet are easily injured.  Check your feet daily for blisters, cuts, and redness. If you cannot see the bottom of your feet, use a mirror or ask someone for help.  Wash your feet with warm water (do not use hot water) and mild soap. Then pat your feet and the areas between your toes until they are completely dry. Do not soak your feet as this can dry your skin.  Apply a moisturizing lotion or petroleum jelly (that does not contain alcohol and is unscented) to the skin on your feet and to dry, brittle toenails. Do not apply lotion between your toes.  Trim your toenails straight across. Do not dig under them or around the cuticle. File the edges of your nails with an emery board or nail file.  Do not cut corns or calluses or try to remove them with medicine.  Wear clean socks or stockings every day. Make sure they are not too tight. Do not wear knee-high stockings since they may decrease blood flow to your legs.  Wear shoes that fit properly and have enough cushioning. To break in new shoes, wear them for just a few hours a day. This prevents you from injuring your feet. Always look in your shoes before you put them on to be sure there are no objects inside.  Do not cross your legs. This may decrease the blood flow to your feet.  If you find a minor scrape,  cut, or break in the skin on your feet, keep it and the skin around it clean and dry. These areas may be cleansed with mild soap and water. Do not cleanse the area with peroxide, alcohol, or iodine.  When you remove an adhesive bandage, be sure not to damage the skin around it.  If you have a wound, look at it several times a day to make sure it is healing.  Do not use heating pads or hot water bottles. They may burn your skin. If you have lost feeling in your feet or legs, you may not know it is happening until it is too late.  Make sure your health care provider performs a complete foot exam at least annually or more often if you have foot problems. Report any cuts, sores, or bruises to your health care provider immediately. SEEK MEDICAL CARE IF:   You have an injury that is not healing.  You have cuts or breaks in the skin.  You have an ingrown nail.  You notice redness on your legs or feet.  You feel burning or tingling in your legs or feet.  You have pain or cramps in your legs and feet.  Your legs or feet are numb.  Your feet always feel cold. SEEK IMMEDIATE MEDICAL CARE IF:   There is increasing redness,   swelling, or pain in or around a wound.  There is a red line that goes up your leg.  Pus is coming from a wound.  You develop a fever or as directed by your health care provider.  You notice a bad smell coming from an ulcer or wound. Document Released: 11/04/2000 Document Revised: 07/10/2013 Document Reviewed: 04/16/2013 ExitCare Patient Information 2015 [employer]. This information is not intended to replace advice given to you by your health care provider. Make sure you discuss any questions you have with your health care provider.  

## 2015-04-25 LAB — CMP14+EGFR
ALT: 12 IU/L (ref 0–44)
AST: 14 IU/L (ref 0–40)
Albumin/Globulin Ratio: 1.7 (ref 1.1–2.5)
Albumin: 4 g/dL (ref 3.6–4.8)
Alkaline Phosphatase: 75 IU/L (ref 39–117)
BUN / CREAT RATIO: 18 (ref 10–22)
BUN: 13 mg/dL (ref 8–27)
Bilirubin Total: 0.3 mg/dL (ref 0.0–1.2)
CALCIUM: 9.5 mg/dL (ref 8.6–10.2)
CHLORIDE: 101 mmol/L (ref 97–108)
CO2: 23 mmol/L (ref 18–29)
CREATININE: 0.73 mg/dL — AB (ref 0.76–1.27)
GFR calc Af Amer: 113 mL/min/{1.73_m2} (ref >59)
GFR, EST NON AFRICAN AMERICAN: 98 mL/min/{1.73_m2} (ref >59)
GLOBULIN, TOTAL: 2.3 g/dL (ref 1.5–4.5)
GLUCOSE: 134 mg/dL — AB (ref 65–99)
POTASSIUM: 5.1 mmol/L (ref 3.5–5.2)
SODIUM: 142 mmol/L (ref 134–144)
Total Protein: 6.3 g/dL (ref 6.0–8.5)

## 2015-04-27 ENCOUNTER — Other Ambulatory Visit: Payer: Self-pay | Admitting: Family Medicine

## 2015-05-23 ENCOUNTER — Other Ambulatory Visit: Payer: Self-pay | Admitting: Physician Assistant

## 2015-07-19 ENCOUNTER — Other Ambulatory Visit: Payer: Self-pay | Admitting: Physician Assistant

## 2015-07-28 ENCOUNTER — Ambulatory Visit (INDEPENDENT_AMBULATORY_CARE_PROVIDER_SITE_OTHER): Payer: Medicare Other

## 2015-07-28 ENCOUNTER — Encounter: Payer: Self-pay | Admitting: Family Medicine

## 2015-07-28 ENCOUNTER — Ambulatory Visit (INDEPENDENT_AMBULATORY_CARE_PROVIDER_SITE_OTHER): Payer: Medicare Other | Admitting: Family Medicine

## 2015-07-28 VITALS — BP 181/87 | HR 73 | Temp 98.3°F | Ht 74.0 in | Wt 210.0 lb

## 2015-07-28 DIAGNOSIS — J441 Chronic obstructive pulmonary disease with (acute) exacerbation: Secondary | ICD-10-CM

## 2015-07-28 DIAGNOSIS — E11610 Type 2 diabetes mellitus with diabetic neuropathic arthropathy: Secondary | ICD-10-CM

## 2015-07-28 DIAGNOSIS — E1140 Type 2 diabetes mellitus with diabetic neuropathy, unspecified: Secondary | ICD-10-CM | POA: Diagnosis not present

## 2015-07-28 DIAGNOSIS — I10 Essential (primary) hypertension: Secondary | ICD-10-CM | POA: Diagnosis not present

## 2015-07-28 DIAGNOSIS — E559 Vitamin D deficiency, unspecified: Secondary | ICD-10-CM

## 2015-07-28 LAB — POCT GLYCOSYLATED HEMOGLOBIN (HGB A1C): Hemoglobin A1C: 7.4

## 2015-07-28 LAB — POCT UA - MICROALBUMIN: MICROALBUMIN (UR) POC: NEGATIVE mg/L

## 2015-07-28 IMAGING — CR DG CHEST 2V
2 series · 2 of 2 positions shown · non-contrast
Comparison: [DATE].

CLINICAL DATA: COPD.

EXAM:
CHEST  2 VIEW

[view not recorded (1 of 2)]
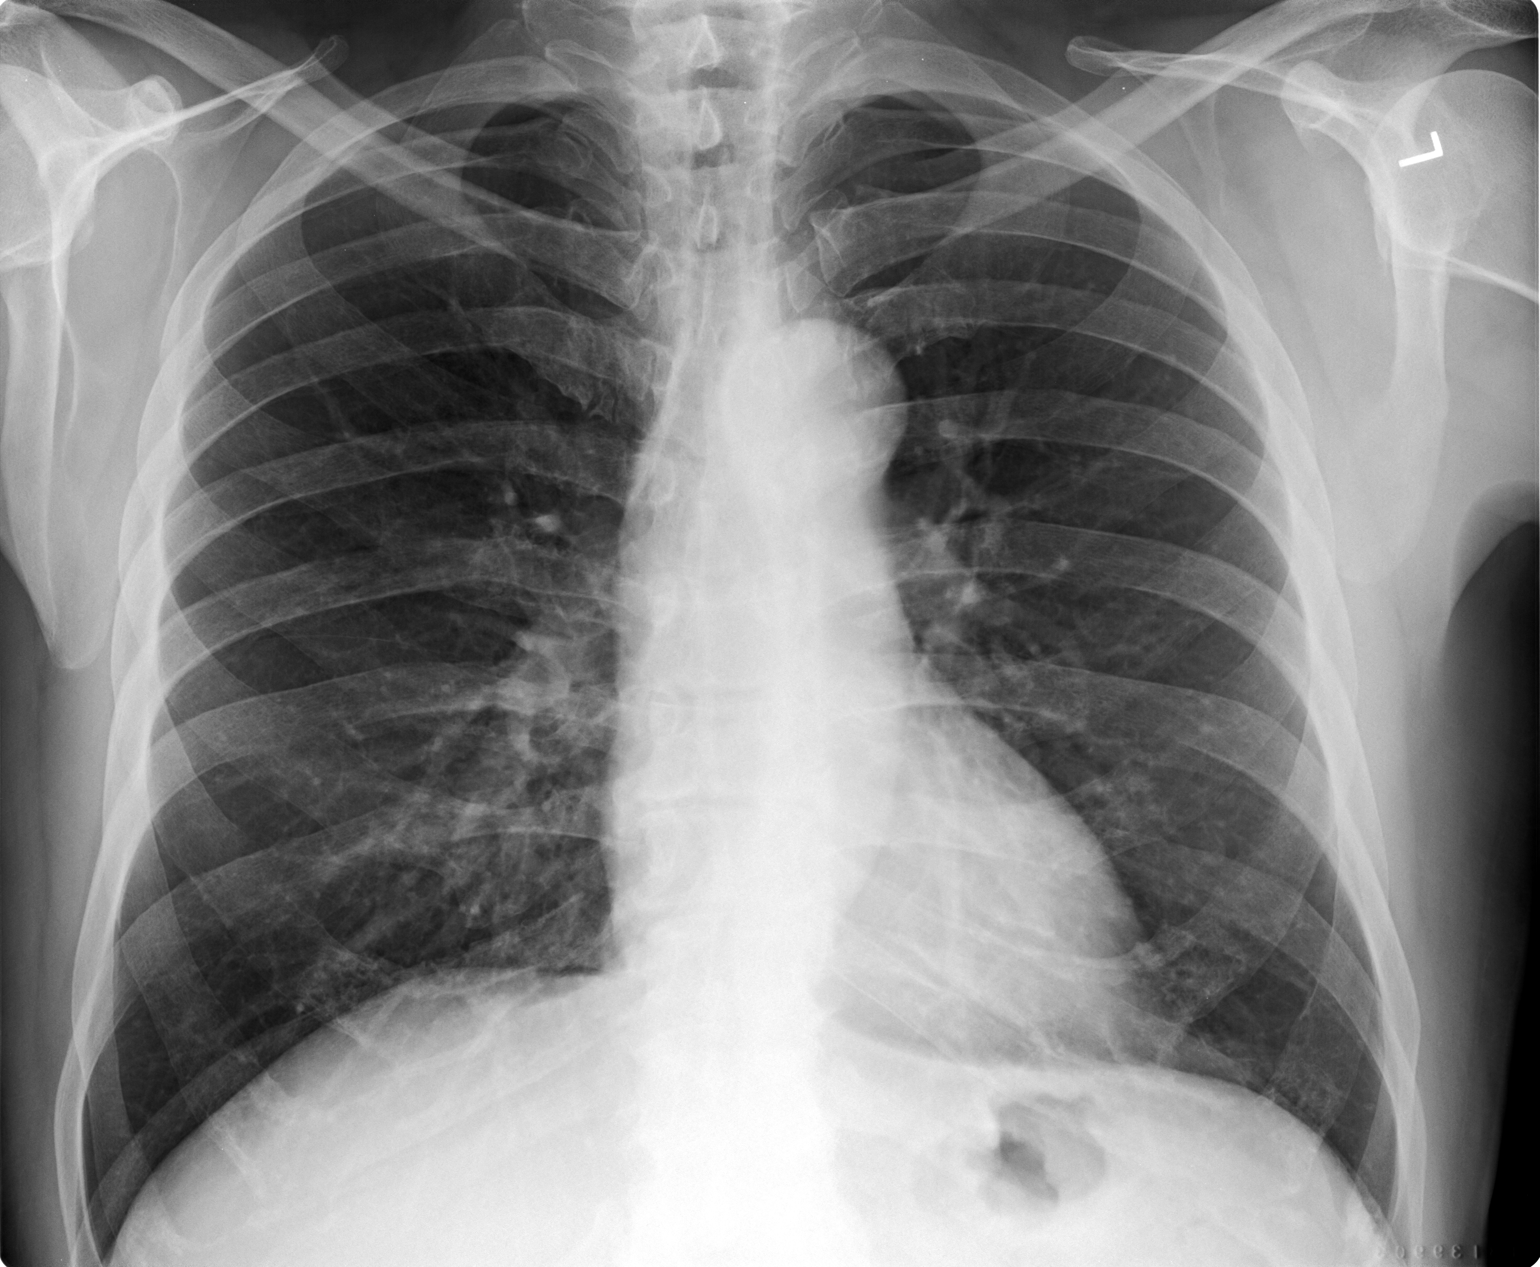

[view not recorded (2 of 2)]
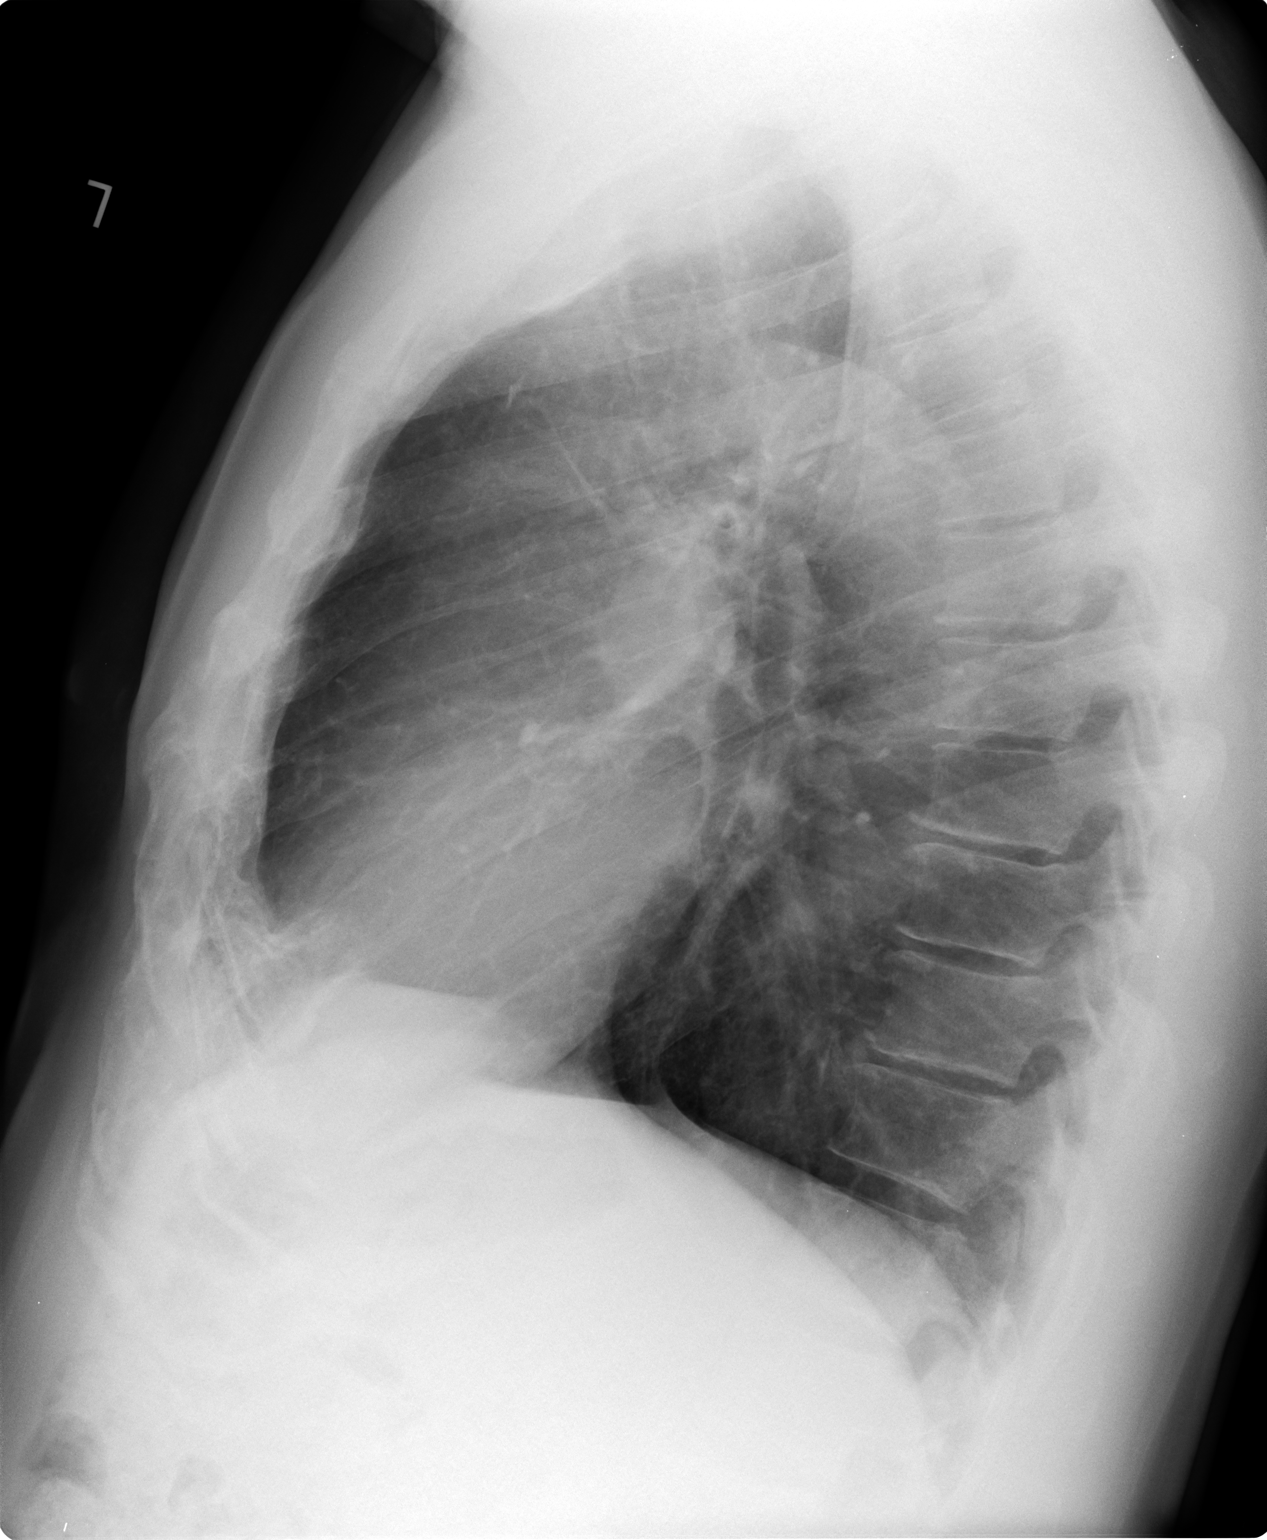

[2 of 2 positions shown; findings below may reference images not displayed]

FINDINGS: Mediastinum and hilar structures normal. Lungs are clear. Heart size
normal. No pleural effusion or pneumothorax. Degenerative changes
thoracic spine
IMPRESSION: No active cardiopulmonary disease.

## 2015-07-28 NOTE — Progress Notes (Signed)
Subjective:    [name], male    DOB: [birth year], 64 y.o.   MRN: [id number]  HPI Pt here for follow up and management of chronic medical problems which includes hypertension and diabetes. He is taking medications regularly. The patient comes to the visit today with no specific complaints. He is requesting a prescription for some diabetic shoes. He is due to return an FOBT get a chest x-ray and get lab work today. He is also due for a urine microalbumin. He comes to the visit today with his wife. The patient specifically denies chest pain, shortness of breath trouble swallowing , heartburn indigestion nausea vomiting diarrhea or blood in the stool. He is passing his water without problems.     Patient Active Problem List   Diagnosis Date Noted  . BPH (benign prostatic hyperplasia) 10/31/2014  . Systolic ejection murmur 31/54/0086  . Bilateral carotid bruits 10/31/2014  . Erectile dysfunction 11/05/2013  . Bilateral carotid artery disease 04/11/2013  . hyperlipidemia 04/11/2013  . HTN (hypertension) 04/11/2013  . COPD exacerbation 04/11/2013  . Diabetes mellitus type 2 controlled 04/11/2013  . Diabetic neuropathy, type II diabetes mellitus 03/15/2013   Outpatient Encounter Prescriptions as of 07/28/2015  Medication Sig  . amLODipine (NORVASC) 10 MG tablet TAKE ONE (1) TABLET EACH DAY  . aspirin 325 MG tablet Take 325 mg by mouth daily.  Marland Kitchen BIOTIN 5000 PO Take 1 capsule by mouth daily.  . cholecalciferol (VITAMIN D) 1000 UNITS tablet Take 1,000 Units by mouth daily.  Marland Kitchen CIALIS 5 MG tablet TAKE ONE (1) TABLET EACH DAY  . fluticasone (FLONASE) 50 MCG/ACT nasal spray USE 2 SPRAYS IN EACH NOSTRIL DAILY  . glucose blood (ACCU-CHEK ACTIVE STRIPS) test strip Use as instructed  . meloxicam (MOBIC) 7.5 MG tablet TAKE ONE TABLET BY MOUTH TWICE DAILY  . metFORMIN (GLUCOPHAGE) 1000 MG tablet TAKE ONE TABLET TWICE A DAY WITH FOOD  . montelukast (SINGULAIR) 10 MG tablet TAKE ONE  TABLET DAILY AT BEDTIME  . pravastatin (PRAVACHOL) 40 MG tablet TAKE ONE (1) TABLET EACH DAY  . ranitidine (ZANTAC) 150 MG tablet TAKE ONE TABLET BY MOUTH TWICE DAILY  . SYMBICORT 160-4.5 MCG/ACT inhaler USE 2 PUFFS TWICE DAILY  . VENTOLIN HFA 108 (90 BASE) MCG/ACT inhaler USE 2 PUFFS EVERY 6 HOURS AS NEEDED  . [DISCONTINUED] azithromycin (ZITHROMAX) 250 MG tablet 2 tablets on day 1. Then 1 tablet on day 2-5   No facility-administered encounter medications on file as of 07/28/2015.     Review of Systems  Constitutional: Negative.   HENT: Negative.   Eyes: Negative.   Respiratory: Negative.   Cardiovascular: Negative.   Gastrointestinal: Negative.   Endocrine: Negative.   Genitourinary: Negative.   Musculoskeletal: Negative.   Skin: Negative.   Allergic/Immunologic: Negative.   Neurological: Negative.   Hematological: Negative.   Psychiatric/Behavioral: Negative.        Objective:   Physical Exam  Constitutional: He is oriented to person, place, and time. He appears well-developed and well-nourished. No distress.  HENT:  Head: Normocephalic and atraumatic.  Right Ear: External ear normal.  Left Ear: External ear normal.  Nose: Nose normal.  Mouth/Throat: Oropharynx is clear and moist. No oropharyngeal exudate.  Eyes: Conjunctivae and EOM are normal. Pupils are equal, round, and reactive to light. Right eye exhibits no discharge. Left eye exhibits no discharge. No scleral icterus.  Neck: Normal range of motion. Neck supple. No thyromegaly present.  No carotid bruit was audible today  and there were no nodes or thyromegaly  Cardiovascular: Normal rate, regular rhythm, normal heart sounds and intact distal pulses.   No murmur heard. Heart is regular at 72/m  Pulmonary/Chest: Effort normal and breath sounds normal. No respiratory distress. He has no wheezes. He has no rales. He exhibits no tenderness.  Clear anteriorly and posteriorly  Abdominal: Soft. Bowel sounds are normal. He  exhibits no mass. There is no tenderness. There is no rebound and no guarding.  No epigastric tenderness or inguinal adenopathy  Musculoskeletal: Normal range of motion. He exhibits no edema.  Lymphadenopathy:    He has no cervical adenopathy.  Neurological: He is alert and oriented to person, place, and time. He has normal reflexes. No cranial nerve deficit.  There is decreased sensation to fine motor testing to the bottom of both feet  Skin: Skin is warm and dry. No rash noted.  The skin was somewhat callused and firm on the bottom of both feet  Psychiatric: He has a normal mood and affect. His behavior is normal. Judgment and thought content normal.  Nursing note and vitals reviewed.   BP 181/87 mmHg  Pulse 73  Temp(Src) 98.3 F (36.8 C) (Oral)  Ht $R'6\' 2"'LP$  (1.88 m)  Wt 210 lb (95.255 kg)  BMI 26.95 kg/m2   [facility] reading (PRIMARY) by  Dr.Griffey Nicasio-chest x-ray-no active disease  Repeat blood pressure was 152/80 sitting left arm large cuff manually                                       Assessment & Plan:  1. Type 2 diabetes mellitus with diabetic neuropathic arthropathy -The patient does have diminished sensation to the bottom of both feet - BMP8+EGFR - CBC with Differential/Platelet - Lipid panel - POCT glycosylated hemoglobin (Hb A1C) - POCT UA - Microalbumin - DME Other see comment  2. Essential hypertension -The patient was instructed to bring blood pressure readings in from the outside. A repeat blood pressure still was elevated systolically. He is encouraged to watch his sodium intake and continue to walk and exercise regularly and stop smoking. - BMP8+EGFR - Hepatic function panel - CBC with Differential/Platelet - Lipid panel - DG Chest 2 View; Future - POCT UA - Microalbumin  3. COPD exacerbation -His breathing is stable today and he should continue to use his inhalers regularly - CBC with Differential/Platelet  4. Vitamin D deficiency -Patient should continue  with current treatment pending results of lab work - CBC with Differential/Platelet - Vit D  25 hydroxy (rtn osteoporosis monitoring)  5. Diabetic neuropathy, type II diabetes mellitus -He will be given a prescription today for some new shoes because of the decreased sensation in the bottom of both feet and by the fact that he has diabetes.  No orders of the defined types were placed in this encounter.   Patient Instructions                       Medicare Annual Wellness Visit  [facility] and the medical providers at [facility] strive to bring you the best medical care.  In doing so we not only want to address your current medical conditions and concerns but also to detect new conditions early and prevent illness, disease and health-related problems.    Medicare offers a yearly Wellness Visit which allows our clinical staff to assess your need  for preventative services including immunizations, lifestyle education, counseling to decrease risk of preventable diseases and screening for fall risk and other medical concerns.    This visit is provided free of charge (no copay) for all Medicare recipients. The clinical pharmacists at [facility] have begun to conduct these Wellness Visits which will also include a thorough review of all your medications.    As you primary medical provider recommend that you make an appointment for your Annual Wellness Visit if you have not done so already this year.  You may set up this appointment before you leave today or you may call back ([phone]) and schedule an appointment.  Please make sure when you call that you mention that you are scheduling your Annual Wellness Visit with the clinical pharmacist so that the appointment may be made for the proper length of time.     Continue current medications. Continue good therapeutic lifestyle changes which include good diet and exercise. Fall precautions discussed with  patient. If an FOBT was given today- please return it to our front desk. If you are over 78 years old - you may need Prevnar 44 or the adult Pneumonia vaccine.  **Flu shots will be available soon--- please call and schedule a FLU-CLINIC appointment**  After your visit with [place] today you will receive a survey in the mail or online from [employer] regarding your care with [place]. Please take a moment to fill this out. Your feedback is very important to [place] as you can help [place] better understand your patient needs as well as improve your experience and satisfaction. WE CARE ABOUT YOU!!!   **Please join [place] SEPT.[email or web address], 2016 from 5:00 to 7:00pm for our OPEN HOUSE! Come out and meet our NEW providers**  the patient should continue to exercise and walk regularly  He should monitor his feet regularly for any redness or sores. He should make every effort to stop smoking  He should bring blood sugars and blood pressures by for review every time he comes to his visit.  He should is pressing bring blood pressures by for review in about 4 weeks    Arrie Senate MD

## 2015-07-28 NOTE — Patient Instructions (Addendum)
Medicare Annual Wellness Visit  [facility] and the medical providers at [facility] strive to bring you the best medical care.  In doing so we not only want to address your current medical conditions and concerns but also to detect new conditions early and prevent illness, disease and health-related problems.    Medicare offers a yearly Wellness Visit which allows our clinical staff to assess your need for preventative services including immunizations, lifestyle education, counseling to decrease risk of preventable diseases and screening for fall risk and other medical concerns.    This visit is provided free of charge (no copay) for all Medicare recipients. The clinical pharmacists at [facility] have begun to conduct these Wellness Visits which will also include a thorough review of all your medications.    As you primary medical provider recommend that you make an appointment for your Annual Wellness Visit if you have not done so already this year.  You may set up this appointment before you leave today or you may call back ([phone]) and schedule an appointment.  Please make sure when you call that you mention that you are scheduling your Annual Wellness Visit with the clinical pharmacist so that the appointment may be made for the proper length of time.     Continue current medications. Continue good therapeutic lifestyle changes which include good diet and exercise. Fall precautions discussed with patient. If an FOBT was given today- please return it to our front desk. If you are over 106 years old - you may need Prevnar 70 or the adult Pneumonia vaccine.  **Flu shots will be available soon--- please call and schedule a FLU-CLINIC appointment**  After your visit with [place] today you will receive a survey in the mail or online from [employer] regarding your care with [place]. Please take a moment to fill this out. Your feedback is  very important to [place] as you can help [place] better understand your patient needs as well as improve your experience and satisfaction. WE CARE ABOUT YOU!!!   **Please join [place] SEPT.[email or web address], 2016 from 5:00 to 7:00pm for our OPEN HOUSE! Come out and meet our NEW providers**  the patient should continue to exercise and walk regularly  He should monitor his feet regularly for any redness or sores. He should make every effort to stop smoking  He should bring blood sugars and blood pressures by for review every time he comes to his visit.  He should is pressing bring blood pressures by for review in about 4 weeks

## 2015-07-29 LAB — CBC WITH DIFFERENTIAL/PLATELET
BASOS ABS: 0 10*3/uL (ref 0.0–0.2)
Basos: 0 %
EOS (ABSOLUTE): 0.2 10*3/uL (ref 0.0–0.4)
Eos: 2 %
Hematocrit: 39.5 % (ref 37.5–51.0)
Hemoglobin: 12.9 g/dL (ref 12.6–17.7)
IMMATURE GRANS (ABS): 0 10*3/uL (ref 0.0–0.1)
IMMATURE GRANULOCYTES: 0 %
LYMPHS: 28 %
Lymphocytes Absolute: 2.2 10*3/uL (ref 0.7–3.1)
MCH: 29.6 pg (ref 26.6–33.0)
MCHC: 32.7 g/dL (ref 31.5–35.7)
MCV: 91 fL (ref 79–97)
MONOS ABS: 0.5 10*3/uL (ref 0.1–0.9)
Monocytes: 6 %
NEUTROS PCT: 64 %
Neutrophils Absolute: 5 10*3/uL (ref 1.4–7.0)
PLATELETS: 311 10*3/uL (ref 150–379)
RBC: 4.36 x10E6/uL (ref 4.14–5.80)
RDW: 14.4 % (ref 12.3–15.4)
WBC: 7.8 10*3/uL (ref 3.4–10.8)

## 2015-07-29 LAB — BMP8+EGFR
BUN/Creatinine Ratio: 22 (ref 10–22)
BUN: 17 mg/dL (ref 8–27)
CALCIUM: 9.6 mg/dL (ref 8.6–10.2)
CO2: 25 mmol/L (ref 18–29)
Chloride: 102 mmol/L (ref 97–108)
Creatinine, Ser: 0.79 mg/dL (ref 0.76–1.27)
GFR, EST AFRICAN AMERICAN: 110 mL/min/{1.73_m2} (ref >59)
GFR, EST NON AFRICAN AMERICAN: 95 mL/min/{1.73_m2} (ref >59)
Glucose: 106 mg/dL — ABNORMAL HIGH (ref 65–99)
Potassium: 4.9 mmol/L (ref 3.5–5.2)
Sodium: 141 mmol/L (ref 134–144)

## 2015-07-29 LAB — VITAMIN D 25 HYDROXY (VIT D DEFICIENCY, FRACTURES): VIT D 25 HYDROXY: 77.2 ng/mL (ref 30.0–100.0)

## 2015-07-29 LAB — LIPID PANEL
Chol/HDL Ratio: 3.1 ratio units (ref 0.0–5.0)
Cholesterol, Total: 157 mg/dL (ref 100–199)
HDL: 50 mg/dL (ref >39)
LDL Calculated: 91 mg/dL (ref 0–99)
Triglycerides: 81 mg/dL (ref 0–149)
VLDL Cholesterol Cal: 16 mg/dL (ref 5–40)

## 2015-07-29 LAB — HEPATIC FUNCTION PANEL
ALK PHOS: 60 IU/L (ref 39–117)
ALT: 12 IU/L (ref 0–44)
AST: 14 IU/L (ref 0–40)
Albumin: 4.3 g/dL (ref 3.6–4.8)
BILIRUBIN TOTAL: 0.2 mg/dL (ref 0.0–1.2)
Bilirubin, Direct: 0.09 mg/dL (ref 0.00–0.40)
Total Protein: 6.7 g/dL (ref 6.0–8.5)

## 2015-08-05 ENCOUNTER — Ambulatory Visit (INDEPENDENT_AMBULATORY_CARE_PROVIDER_SITE_OTHER): Payer: Medicare Other | Admitting: *Deleted

## 2015-08-05 VITALS — BP 167/79 | HR 77

## 2015-08-05 DIAGNOSIS — Z0130 Encounter for examination of blood pressure without abnormal findings: Secondary | ICD-10-CM

## 2015-08-05 DIAGNOSIS — Z136 Encounter for screening for cardiovascular disorders: Secondary | ICD-10-CM

## 2015-08-05 NOTE — Progress Notes (Signed)
This patient has diabetes. He should be on an ACE inhibitor anyway. Please start lisinopril 10 mg 1 daily and have him to continue to monitor his blood pressures at home and when he comes to the office in 4 weeks we will see what his blood pressure is then

## 2015-08-05 NOTE — Progress Notes (Signed)
Patient walked into today complaining of high blood pressure.  He is asymptomatic.  His last visit with Dr Laurance Flatten was last week and his blood pressure was elevated at that visit as well. He was asked to keep a log and follow up in 1 month.  He has been taking his pressure at home. This morning prior to taking his medication it was 172/74. Readings from the past few days have fluctuated but this was the highest.  BP 167/79 in office  Explained that the pressure will come down as his blood pressure medicine starts to work.  I did ask him to hold his mobic for a few days since NSAIDs have the potential to increase blood pressure and he denies any change in diet or activity level that can cause an increase. He will continue to monitor his blood pressure and notify us if he becomes symptomatic or if he's having regular readings over 160/90. Appointment scheduled for 1 month follow up. Patient stated understanding and agreement to plan.

## 2015-08-05 NOTE — Patient Instructions (Signed)
Hold Mobic for now. I will check with Dr Laurance Flatten about it's potential to affect his blood pressure since there have been no other changes in diet or lifestyle.  Take Tylenol for joint pain. Continue to take and record daily blood pressures. Notify us if blood pressure is over 160/90 or if he is symptomatic. Keep follow up appointment with Dr Laurance Flatten

## 2015-08-05 NOTE — Progress Notes (Signed)
Call patient and start this medicine as directed above

## 2015-08-06 ENCOUNTER — Ambulatory Visit: Payer: Medicare Other | Admitting: Family

## 2015-08-07 MED ORDER — LISINOPRIL 10 MG PO TABS: 10.0000 mg | ORAL_TABLET | Freq: Every day | ORAL | Status: DC

## 2015-08-07 NOTE — Progress Notes (Signed)
Spoke with patient and his wife this morning. Blood pressure has been normal to mildly elevated since stopping the mobic. The highest reading was 149/66 and lowest was 122/68. Advised that Lisinopril 10mg  has the added benefit of protecting his kidneys since he is a diabetic.  He will continue to monitor blood pressures and bring the readings with him to his next office visit. If he has a drop in blood pressure or if he feels weak or dizzy they will call back. He will continue to hold the Mobic for now.  May need to discontinue Mobic for good since he is a diabetic. Will have Dr Laurance Flatten discuss at appointment.

## 2015-08-11 ENCOUNTER — Telehealth: Payer: Self-pay | Admitting: Family Medicine

## 2015-08-11 NOTE — Telephone Encounter (Signed)
I called [employer] and they have no idea who called.

## 2015-08-15 ENCOUNTER — Other Ambulatory Visit: Payer: Self-pay | Admitting: Family Medicine

## 2015-08-24 ENCOUNTER — Telehealth: Payer: Self-pay | Admitting: Family Medicine

## 2015-08-24 NOTE — Telephone Encounter (Signed)
lmovm that appt has been rescheduled to 10/10 at 10am w/ Dr. Laurance Flatten

## 2015-08-26 ENCOUNTER — Ambulatory Visit: Payer: Medicare Other | Admitting: Family Medicine

## 2015-08-31 ENCOUNTER — Ambulatory Visit (INDEPENDENT_AMBULATORY_CARE_PROVIDER_SITE_OTHER): Payer: Medicare Other | Admitting: Family Medicine

## 2015-08-31 ENCOUNTER — Encounter: Payer: Self-pay | Admitting: Family Medicine

## 2015-08-31 VITALS — BP 168/91 | HR 82 | Temp 98.7°F | Ht 74.0 in | Wt 205.0 lb

## 2015-08-31 DIAGNOSIS — I10 Essential (primary) hypertension: Secondary | ICD-10-CM

## 2015-08-31 DIAGNOSIS — Z23 Encounter for immunization: Secondary | ICD-10-CM | POA: Diagnosis not present

## 2015-08-31 DIAGNOSIS — F4323 Adjustment disorder with mixed anxiety and depressed mood: Secondary | ICD-10-CM | POA: Diagnosis not present

## 2015-08-31 NOTE — Patient Instructions (Signed)
The patient should continue to take his blood pressure medicine regularly He should watch his sodium intake He should bring readings from home and have his blood pressure rechecked here by the nurse in a couple weeks

## 2015-08-31 NOTE — Progress Notes (Signed)
Subjective:    [name], male    DOB: 12-20-1950, 64 y.o.   MRN: [id number]  HPI Patient here today for 1 month follow up on HTN. He states that his wife passed away last week and he is all "out of sorts" today. He did not bring a copy of his BP readings in with him today. His wife died suddenly and unexpectedly. She had been checking his blood pressure readings at home. He cannot find these. He has been taking his blood pressure medicine regularly.       Patient Active Problem List   Diagnosis Date Noted  . BPH (benign prostatic hyperplasia) 10/31/2014  . Systolic ejection murmur 42/70/6237  . Bilateral carotid bruits 10/31/2014  . Erectile dysfunction 11/05/2013  . Bilateral carotid artery disease ([place]) 04/11/2013  . hyperlipidemia 04/11/2013  . HTN (hypertension) 04/11/2013  . COPD exacerbation ([place]) 04/11/2013  . Diabetes mellitus type 2 controlled 04/11/2013  . Diabetic neuropathy, type II diabetes mellitus ([place]) 03/15/2013   Outpatient Encounter Prescriptions as of 08/31/2015  Medication Sig  . amLODipine (NORVASC) 10 MG tablet TAKE ONE (1) TABLET EACH DAY  . aspirin 325 MG tablet Take 325 mg by mouth daily.  Marland Kitchen BIOTIN 5000 PO Take 1 capsule by mouth daily.  . cholecalciferol (VITAMIN D) 1000 UNITS tablet Take 1,000 Units by mouth daily.  Marland Kitchen CIALIS 5 MG tablet TAKE ONE (1) TABLET EACH DAY  . fluticasone (FLONASE) 50 MCG/ACT nasal spray USE 2 SPRAYS IN EACH NOSTRIL DAILY  . glucose blood (ACCU-CHEK ACTIVE STRIPS) test strip Use as instructed  . lisinopril (PRINIVIL,ZESTRIL) 10 MG tablet Take 1 tablet (10 mg total) by mouth daily.  . meloxicam (MOBIC) 7.5 MG tablet TAKE ONE TABLET BY MOUTH TWICE DAILY  . metFORMIN (GLUCOPHAGE) 1000 MG tablet TAKE ONE TABLET TWICE A DAY WITH FOOD  . montelukast (SINGULAIR) 10 MG tablet TAKE ONE TABLET DAILY AT BEDTIME  . pravastatin (PRAVACHOL) 40 MG tablet TAKE ONE (1) TABLET EACH DAY  . ranitidine (ZANTAC) 150 MG tablet  TAKE ONE TABLET BY MOUTH TWICE DAILY  . SYMBICORT 160-4.5 MCG/ACT inhaler USE 2 PUFFS TWICE DAILY  . VENTOLIN HFA 108 (90 BASE) MCG/ACT inhaler USE 2 PUFFS EVERY 6 HOURS AS NEEDED   No facility-administered encounter medications on file as of 08/31/2015.      Review of Systems  Constitutional: Negative.   HENT: Negative.   Eyes: Negative.   Respiratory: Negative.   Cardiovascular: Negative.   Gastrointestinal: Negative.   Endocrine: Negative.   Genitourinary: Negative.   Musculoskeletal: Negative.   Skin: Negative.   Allergic/Immunologic: Negative.   Neurological: Negative.   Hematological: Negative.   Psychiatric/Behavioral: Negative.        Some anxiety - wife passed away last week       Objective:   Physical Exam  Constitutional: He is oriented to person, place, and time. He appears well-developed and well-nourished. He appears distressed.  HENT:  Head: Normocephalic.  Eyes: Conjunctivae are normal. Pupils are equal, round, and reactive to light. Right eye exhibits no discharge. Left eye exhibits no discharge. No scleral icterus.  Neck: Normal range of motion. Neck supple.  Cardiovascular: Normal rate, regular rhythm and normal heart sounds.   No murmur heard. At 72/m  Pulmonary/Chest: Effort normal. He has no wheezes. He has no rales.  Musculoskeletal: Normal range of motion.  Neurological: He is alert and oriented to person, place, and time.  Skin: Skin is warm. No rash noted.  Psychiatric: He has a normal mood and affect. His behavior is normal. Thought content normal.  Vitals reviewed.   BP 168/91 mmHg  Pulse 82  Temp(Src) 98.7 F (37.1 C) (Oral)  Ht 6\' 2"  (1.88 m)  Wt 205 lb (92.987 kg)  BMI 26.31 kg/m2  Repeat blood pressure 158/80 right arm regular cuff sitting     Assessment & Plan:  1. Essential hypertension -No change in treatment today. Patient will return to clinic in a couple weeks and have the nurse recheck his blood pressure with readings  from home.  2. Adjustment disorder with mixed anxiety and depressed mood -The patient just lost his wife suddenly. This is not a good time to adjust blood pressure medication. The patient will continue to watch his sodium intake and will bring readings from home in a couple weeks and we will recheck the blood pressure here by the nurse  Patient Instructions  The patient should continue to take his blood pressure medicine regularly He should watch his sodium intake He should bring readings from home and have his blood pressure rechecked here by the nurse in a couple weeks   Arrie Senate MD

## 2015-09-01 DIAGNOSIS — E1140 Type 2 diabetes mellitus with diabetic neuropathy, unspecified: Secondary | ICD-10-CM | POA: Diagnosis not present

## 2015-09-01 DIAGNOSIS — L84 Corns and callosities: Secondary | ICD-10-CM | POA: Diagnosis not present

## 2015-09-01 DIAGNOSIS — E11610 Type 2 diabetes mellitus with diabetic neuropathic arthropathy: Secondary | ICD-10-CM | POA: Diagnosis not present

## 2015-09-14 ENCOUNTER — Ambulatory Visit (INDEPENDENT_AMBULATORY_CARE_PROVIDER_SITE_OTHER): Payer: Medicare Other | Admitting: *Deleted

## 2015-09-14 VITALS — BP 123/71 | HR 80

## 2015-09-14 DIAGNOSIS — I10 Essential (primary) hypertension: Secondary | ICD-10-CM

## 2015-09-14 NOTE — Progress Notes (Signed)
Pt here for BP check

## 2015-09-16 ENCOUNTER — Other Ambulatory Visit: Payer: Self-pay | Admitting: Family Medicine

## 2015-09-17 DIAGNOSIS — Z0289 Encounter for other administrative examinations: Secondary | ICD-10-CM

## 2015-10-10 ENCOUNTER — Other Ambulatory Visit: Payer: Self-pay | Admitting: Family Medicine

## 2015-10-12 MED ORDER — ACCU-CHEK SOFT TOUCH LANCETS MISC: Status: DC

## 2015-10-12 NOTE — Telephone Encounter (Signed)
done

## 2015-10-20 ENCOUNTER — Other Ambulatory Visit: Payer: Self-pay | Admitting: Family Medicine

## 2015-10-20 ENCOUNTER — Other Ambulatory Visit: Payer: Self-pay | Admitting: Physician Assistant

## 2015-11-20 ENCOUNTER — Other Ambulatory Visit: Payer: Self-pay | Admitting: Family Medicine

## 2015-12-18 ENCOUNTER — Other Ambulatory Visit: Payer: Self-pay | Admitting: Family Medicine

## 2015-12-21 ENCOUNTER — Ambulatory Visit: Payer: Medicare Other | Admitting: Family Medicine

## 2015-12-22 ENCOUNTER — Encounter: Payer: Self-pay | Admitting: Family Medicine

## 2016-01-15 ENCOUNTER — Ambulatory Visit: Payer: Medicare Other | Admitting: Family Medicine

## 2016-01-17 ENCOUNTER — Other Ambulatory Visit: Payer: Self-pay | Admitting: Family Medicine

## 2016-01-19 ENCOUNTER — Encounter: Payer: Self-pay | Admitting: Family Medicine

## 2016-01-29 ENCOUNTER — Ambulatory Visit: Payer: Medicare Other | Admitting: Family Medicine

## 2016-02-12 ENCOUNTER — Ambulatory Visit (INDEPENDENT_AMBULATORY_CARE_PROVIDER_SITE_OTHER): Payer: Medicare Other | Admitting: Family Medicine

## 2016-02-12 ENCOUNTER — Encounter: Payer: Self-pay | Admitting: Family Medicine

## 2016-02-12 ENCOUNTER — Telehealth: Payer: Self-pay | Admitting: *Deleted

## 2016-02-12 ENCOUNTER — Other Ambulatory Visit: Payer: Self-pay | Admitting: Family Medicine

## 2016-02-12 VITALS — BP 168/88 | HR 81 | Temp 98.1°F | Ht 74.0 in | Wt 200.0 lb

## 2016-02-12 DIAGNOSIS — F4323 Adjustment disorder with mixed anxiety and depressed mood: Secondary | ICD-10-CM | POA: Diagnosis not present

## 2016-02-12 DIAGNOSIS — Z1211 Encounter for screening for malignant neoplasm of colon: Secondary | ICD-10-CM | POA: Diagnosis not present

## 2016-02-12 DIAGNOSIS — R011 Cardiac murmur, unspecified: Secondary | ICD-10-CM | POA: Diagnosis not present

## 2016-02-12 DIAGNOSIS — N400 Enlarged prostate without lower urinary tract symptoms: Secondary | ICD-10-CM | POA: Diagnosis not present

## 2016-02-12 DIAGNOSIS — Z23 Encounter for immunization: Secondary | ICD-10-CM

## 2016-02-12 DIAGNOSIS — R0989 Other specified symptoms and signs involving the circulatory and respiratory systems: Secondary | ICD-10-CM

## 2016-02-12 DIAGNOSIS — E559 Vitamin D deficiency, unspecified: Secondary | ICD-10-CM | POA: Diagnosis not present

## 2016-02-12 DIAGNOSIS — E11610 Type 2 diabetes mellitus with diabetic neuropathic arthropathy: Secondary | ICD-10-CM | POA: Diagnosis not present

## 2016-02-12 DIAGNOSIS — I10 Essential (primary) hypertension: Secondary | ICD-10-CM | POA: Diagnosis not present

## 2016-02-12 LAB — URINALYSIS, COMPLETE
Bilirubin, UA: NEGATIVE
KETONES UA: NEGATIVE
Leukocytes, UA: NEGATIVE
NITRITE UA: NEGATIVE
RBC, UA: NEGATIVE
SPEC GRAV UA: 1.02 (ref 1.005–1.030)
Urobilinogen, Ur: 1 mg/dL (ref 0.2–1.0)
pH, UA: 7 (ref 5.0–7.5)

## 2016-02-12 LAB — MICROSCOPIC EXAMINATION
Bacteria, UA: NONE SEEN
Epithelial Cells (non renal): NONE SEEN /hpf (ref 0–10)

## 2016-02-12 LAB — BAYER DCA HB A1C WAIVED: HB A1C (BAYER DCA - WAIVED): 7.5 % — ABNORMAL HIGH (ref <7.0)

## 2016-02-12 MED ORDER — ESCITALOPRAM OXALATE 10 MG PO TABS: 10.0000 mg | ORAL_TABLET | Freq: Every day | ORAL | Status: DC

## 2016-02-12 MED ORDER — METRONIDAZOLE 500 MG PO TABS: 500.0000 mg | ORAL_TABLET | Freq: Three times a day (TID) | ORAL | Status: DC

## 2016-02-12 NOTE — Telephone Encounter (Signed)
Pt called and aware of urine  - positive for Trich - Flagyl sent to pharm

## 2016-02-12 NOTE — Progress Notes (Signed)
Subjective:    [name], male    DOB: [birth year], 65 y.o.   MRN: [id number]  HPI Pt here for follow up and management of chronic medical problems which includes diabetes, hyperlipidemia,and hypertension. He is taking medications regularly.The patient is doing well overall. He does complain of some anxiety and nervousness. He is due to get lab work today. He is due to receive the Prevnar vaccine and a urinalysis. He will be given an FOBT to return and we'll get a prostate exam today also. The patient just lost his wife suddenly in November she was shopping and collapsed and died. She was 66 years old. The patient appears somewhat trembly and upset today. He says he is talking to a friend who is working with counseling him on the loss of his wife. I offered other help but he says this is fine for now. He denies any chest pain or shortness of breath. He is passing his water without problems. He has no symptoms with his GI tract like nausea vomiting diarrhea blood in the stool or black tarry bowel movements. He has had his eyes examined at [facility]. We will document that. His weight is down 5 pounds since the last visit and his BMI is 26. Patient says that his home blood pressures have been running in the 160 over the 90 range. The patient had an echocardiogram 2 years ago this month and it showed an ejection fraction of 65-70%. He also had carotid Dopplers 3 years ago and there was some blockage bilaterally one side greater than the other at up to 69%. The patient indicates that his blood sugars at home have been running in the 125-150 range.     Patient Active Problem List   Diagnosis Date Noted  . BPH (benign prostatic hyperplasia) 10/31/2014  . Systolic ejection murmur 47/65/4650  . Bilateral carotid bruits 10/31/2014  . Erectile dysfunction 11/05/2013  . Bilateral carotid artery disease ([place]) 04/11/2013  . hyperlipidemia 04/11/2013  . HTN (hypertension) 04/11/2013  .  COPD exacerbation ([place]) 04/11/2013  . Diabetes mellitus type 2 controlled 04/11/2013  . Diabetic neuropathy, type II diabetes mellitus ([place]) 03/15/2013   Outpatient Encounter Prescriptions as of 02/12/2016  Medication Sig  . amLODipine (NORVASC) 10 MG tablet TAKE ONE (1) TABLET EACH DAY  . aspirin 325 MG tablet Take 325 mg by mouth daily.  Marland Kitchen BIOTIN 5000 PO Take 1 capsule by mouth daily.  . cholecalciferol (VITAMIN D) 1000 UNITS tablet Take 1,000 Units by mouth daily.  Marland Kitchen CIALIS 5 MG tablet TAKE ONE (1) TABLET EACH DAY  . fluticasone (FLONASE) 50 MCG/ACT nasal spray USE 2 SPRAYS IN EACH NOSTRIL DAILY  . glucose blood (ACCU-CHEK ACTIVE STRIPS) test strip Use as instructed  . Lancets (ACCU-CHEK SOFT TOUCH) lancets Test blood sugar qd. DX E11.9  . lisinopril (PRINIVIL,ZESTRIL) 10 MG tablet TAKE ONE (1) TABLET EACH DAY  . meloxicam (MOBIC) 7.5 MG tablet TAKE ONE TABLET BY MOUTH TWICE DAILY  . metFORMIN (GLUCOPHAGE) 1000 MG tablet TAKE ONE TABLET TWICE A DAY WITH FOOD  . montelukast (SINGULAIR) 10 MG tablet TAKE ONE TABLET DAILY AT BEDTIME  . pravastatin (PRAVACHOL) 40 MG tablet TAKE ONE (1) TABLET EACH DAY  . ranitidine (ZANTAC) 150 MG tablet TAKE ONE TABLET BY MOUTH TWICE DAILY  . SYMBICORT 160-4.5 MCG/ACT inhaler USE 2 PUFFS TWICE DAILY  . VENTOLIN HFA 108 (90 BASE) MCG/ACT inhaler USE 2 PUFFS EVERY 6 HOURS AS NEEDED   No  facility-administered encounter medications on file as of 02/12/2016.      Review of Systems  Constitutional: Negative.   HENT: Negative.   Eyes: Negative.   Respiratory: Negative.   Cardiovascular: Negative.   Gastrointestinal: Negative.   Endocrine: Negative.   Genitourinary: Negative.   Musculoskeletal: Negative.   Skin: Negative.   Allergic/Immunologic: Negative.   Neurological: Negative.   Hematological: Negative.   Psychiatric/Behavioral: The patient is nervous/anxious.        Objective:   Physical Exam  Constitutional: He is oriented to person, place,  and time. He appears well-developed and well-nourished. He appears distressed.  The patient was discussing at the visit today the sudden loss of his wife back in November and he still somewhat obviously upset about this.  HENT:  Head: Normocephalic and atraumatic.  Right Ear: External ear normal.  Left Ear: External ear normal.  Mouth/Throat: Oropharynx is clear and moist. No oropharyngeal exudate.  Some nasal congestion.  Eyes: Conjunctivae and EOM are normal. Pupils are equal, round, and reactive to light. Right eye exhibits no discharge. Left eye exhibits no discharge. No scleral icterus.  Neck: Normal range of motion. Neck supple. No thyromegaly present.  He has bilateral carotid bruits. His last Dopplers were about 3 years ago.  Cardiovascular: Normal rate, regular rhythm, normal heart sounds and intact distal pulses.   No murmur heard. The heart has a regular rate and rhythm at 60/m with a grade 3/6 systolic ejection murmur.  Pulmonary/Chest: Effort normal and breath sounds normal. No respiratory distress. He has no wheezes. He has no rales. He exhibits no tenderness.  Clear anteriorly and posteriorly  Abdominal: Soft. Bowel sounds are normal. He exhibits no mass. There is no tenderness. There is no rebound and no guarding.  No liver or spleen enlargement and no epigastric tenderness. No abdominal bruits.  Genitourinary: Rectum normal and penis normal.  The prostate is enlarged but soft and smooth. There were no rectal masses. There were no inguinal hernias present. The external genitalia were within normal limits.  Musculoskeletal: Normal range of motion. He exhibits no edema or tenderness.  Lymphadenopathy:    He has no cervical adenopathy.  Neurological: He is alert and oriented to person, place, and time. He has normal reflexes. No cranial nerve deficit.  Skin: Skin is warm and dry. No rash noted.  Psychiatric: He has a normal mood and affect. His behavior is normal. Judgment and  thought content normal.  The patient's mood was somewhat down today. He is also somewhat trembly.  Nursing note and vitals reviewed.  BP 174/73 mmHg  Pulse 81  Temp(Src) 98.1 F (36.7 C) (Oral)  Ht '6\' 2"'  (1.88 m)  Wt 200 lb (90.719 kg)  BMI 25.67 kg/m2        Assessment & Plan:  1. Essential hypertension -The patient's blood pressure today is too high. The second check today was 168/88. The patient indicates he is not eating that much more sodium in his diet. He checks his blood pressures at home. He would bring readings to the next visit in about 4 weeks and if numbers are not improved we'll have to increase his medicine at that time. - Bayer DCA Hb A1c Waived - BMP8+EGFR - CBC with Differential/Platelet - Hepatic function panel - Lipid panel  2. Type 2 diabetes mellitus with diabetic neuropathic arthropathy, without long-term current use of insulin (HCC) -Exercise more regularly and bring blood sugars to the next visit in 4 weeks - Bayer DCA Hb A1c  Waived - CBC with Differential/Platelet  3. Vitamin D deficiency -Continue current treatment pending results of lab work - CBC with Differential/Platelet - VITAMIN D 25 Hydroxy (Vit-D Deficiency, Fractures)  4. Special screening for malignant neoplasms, colon - Fecal occult blood, imunochemical; Future  5. BPH (benign prostatic hyperplasia) -The patient is having no symptoms with this. We will check a PSA today. - Urinalysis, Complete - PSA, total and free  6. Adjustment reaction with anxiety and depression -We will start the patient on Lexapro and encourage him to get counseling because of the loss of his wife. We will see him back in 4 weeks and hope that the antidepressant will help him feel better and that it may even help his blood pressure.  Meds ordered this encounter  Medications  . escitalopram (LEXAPRO) 10 MG tablet    Sig: Take 1 tablet (10 mg total) by mouth daily.    Dispense:  30 tablet    Refill:  2    Patient Instructions                       Medicare Annual Wellness Visit  [facility] and the medical providers at [facility] strive to bring you the best medical care.  In doing so we not only want to address your current medical conditions and concerns but also to detect new conditions early and prevent illness, disease and health-related problems.    Medicare offers a yearly Wellness Visit which allows our clinical staff to assess your need for preventative services including immunizations, lifestyle education, counseling to decrease risk of preventable diseases and screening for fall risk and other medical concerns.    This visit is provided free of charge (no copay) for all Medicare recipients. The clinical pharmacists at [facility] have begun to conduct these Wellness Visits which will also include a thorough review of all your medications.    As you primary medical provider recommend that you make an appointment for your Annual Wellness Visit if you have not done so already this year.  You may set up this appointment before you leave today or you may call back ([phone]) and schedule an appointment.  Please make sure when you call that you mention that you are scheduling your Annual Wellness Visit with the clinical pharmacist so that the appointment may be made for the proper length of time.   \  Continue current medications. Continue good therapeutic lifestyle changes which include good diet and exercise. Fall precautions discussed with patient. If an FOBT was given today- please return it to our front desk. If you are over 70 years old - you may need Prevnar 73 or the adult Pneumonia vaccine.  **Flu shots are available--- please call and schedule a FLU-CLINIC appointment**  After your visit with [place] today you will receive a survey in the mail or online from [employer] regarding your care with [place]. Please take a moment to fill this out.  Your feedback is very important to [place] as you can help [place] better understand your patient needs as well as improve your experience and satisfaction. WE CARE ABOUT YOU!!!   The patient should check blood pressure readings at home and bring these readings back to his next visit He should watch his salt intake closely and walk and exercise as much as possible He should drink plenty of fluids He should continue to check his blood sugars regularly and also bring blood sugar readings  in to the next visit He should start the antidepressant they were calling in for him and take one of these daily at bedtime If he decides we will be glad to set him up for some counseling because of the loss of his wife++++ He should return to the office in 4 weeks   Arrie Senate MD

## 2016-02-12 NOTE — Patient Instructions (Addendum)
Medicare Annual Wellness Visit  [facility] and the medical providers at [facility] strive to bring you the best medical care.  In doing so we not only want to address your current medical conditions and concerns but also to detect new conditions early and prevent illness, disease and health-related problems.    Medicare offers a yearly Wellness Visit which allows our clinical staff to assess your need for preventative services including immunizations, lifestyle education, counseling to decrease risk of preventable diseases and screening for fall risk and other medical concerns.    This visit is provided free of charge (no copay) for all Medicare recipients. The clinical pharmacists at [facility] have begun to conduct these Wellness Visits which will also include a thorough review of all your medications.    As you primary medical provider recommend that you make an appointment for your Annual Wellness Visit if you have not done so already this year.  You may set up this appointment before you leave today or you may call back [phone] and schedule an appointment.  Please make sure when you call that you mention that you are scheduling your Annual Wellness Visit with the clinical pharmacist so that the appointment may be made for the proper length of time.   \  Continue current medications. Continue good therapeutic lifestyle changes which include good diet and exercise. Fall precautions discussed with patient. If an FOBT was given today- please return it to our front desk. If you are over 93 years old - you may need Prevnar 5 or the adult Pneumonia vaccine.  **Flu shots are available--- please call and schedule a FLU-CLINIC appointment**  After your visit with [place] today you will receive a survey in the mail or online from [employer] regarding your care with [place]. Please take a moment to fill this out. Your feedback is very  important to [place] as you can help [place] better understand your patient needs as well as improve your experience and satisfaction. WE CARE ABOUT YOU!!!   The patient should check blood pressure readings at home and bring these readings back to his next visit He should watch his salt intake closely and walk and exercise as much as possible He should drink plenty of fluids He should continue to check his blood sugars regularly and also bring blood sugar readings in to the next visit He should start the antidepressant they were calling in for him and take one of these daily at bedtime If he decides we will be glad to set him up for some counseling because of the loss of his wife++++ He should return to the office in 4 weeks

## 2016-02-13 LAB — LIPID PANEL
CHOLESTEROL TOTAL: 162 mg/dL (ref 100–199)
Chol/HDL Ratio: 2.7 ratio units (ref 0.0–5.0)
HDL: 60 mg/dL (ref >39)
LDL CALC: 87 mg/dL (ref 0–99)
TRIGLYCERIDES: 73 mg/dL (ref 0–149)
VLDL CHOLESTEROL CAL: 15 mg/dL (ref 5–40)

## 2016-02-13 LAB — BMP8+EGFR
BUN / CREAT RATIO: 20 (ref 10–22)
BUN: 15 mg/dL (ref 8–27)
CO2: 26 mmol/L (ref 18–29)
Calcium: 9.2 mg/dL (ref 8.6–10.2)
Chloride: 101 mmol/L (ref 96–106)
Creatinine, Ser: 0.76 mg/dL (ref 0.76–1.27)
GFR calc Af Amer: 111 mL/min/{1.73_m2} (ref >59)
GFR, EST NON AFRICAN AMERICAN: 96 mL/min/{1.73_m2} (ref >59)
Glucose: 206 mg/dL — ABNORMAL HIGH (ref 65–99)
POTASSIUM: 4.4 mmol/L (ref 3.5–5.2)
SODIUM: 142 mmol/L (ref 134–144)

## 2016-02-13 LAB — PSA, TOTAL AND FREE
PROSTATE SPECIFIC AG, SERUM: 0.8 ng/mL (ref 0.0–4.0)
PSA, Free Pct: 26.3 %
PSA, Free: 0.21 ng/mL

## 2016-02-13 LAB — CBC WITH DIFFERENTIAL/PLATELET
BASOS: 0 %
Basophils Absolute: 0 10*3/uL (ref 0.0–0.2)
EOS (ABSOLUTE): 0.1 10*3/uL (ref 0.0–0.4)
EOS: 1 %
HEMATOCRIT: 41.2 % (ref 37.5–51.0)
Hemoglobin: 13.6 g/dL (ref 12.6–17.7)
IMMATURE GRANULOCYTES: 0 %
Immature Grans (Abs): 0 10*3/uL (ref 0.0–0.1)
LYMPHS ABS: 1.8 10*3/uL (ref 0.7–3.1)
Lymphs: 21 %
MCH: 30.2 pg (ref 26.6–33.0)
MCHC: 33 g/dL (ref 31.5–35.7)
MCV: 92 fL (ref 79–97)
MONOS ABS: 0.6 10*3/uL (ref 0.1–0.9)
Monocytes: 7 %
NEUTROS ABS: 6.1 10*3/uL (ref 1.4–7.0)
NEUTROS PCT: 71 %
PLATELETS: 349 10*3/uL (ref 150–379)
RBC: 4.5 x10E6/uL (ref 4.14–5.80)
RDW: 14.8 % (ref 12.3–15.4)
WBC: 8.5 10*3/uL (ref 3.4–10.8)

## 2016-02-13 LAB — VITAMIN D 25 HYDROXY (VIT D DEFICIENCY, FRACTURES): Vit D, 25-Hydroxy: 61.2 ng/mL (ref 30.0–100.0)

## 2016-02-13 LAB — PLEASE NOTE

## 2016-02-13 LAB — HEPATIC FUNCTION PANEL
ALT: 13 IU/L (ref 0–44)
AST: 9 IU/L (ref 0–40)
Albumin: 4.3 g/dL (ref 3.6–4.8)
Alkaline Phosphatase: 69 IU/L (ref 39–117)
BILIRUBIN, DIRECT: 0.06 mg/dL (ref 0.00–0.40)
Bilirubin Total: <0.2 mg/dL (ref 0.0–1.2)
TOTAL PROTEIN: 6.4 g/dL (ref 6.0–8.5)

## 2016-02-23 NOTE — Progress Notes (Signed)
Cardiology Office Note   Date:  02/24/2016   ID:  Omar Todd, DOB 1951/05/13, MRN [id number]  PCP:  Redge Gainer, MD  Cardiologist:   Minus Breeding, MD   Chief Complaint  Patient presents with  . Hypertension      History of Present Illness: Omar Todd is a 65 y.o. male who presents for evaluation of difficult to control hypertension. He has significant cardiovascular risk factors as well. He's also had some nonobstructive carotid disease in the past. He has a heart murmur. I did review an echocardiogram that was done in 2015 demonstrated aortic sclerosis. He actually gets along fairly well. He still recovering from the death of his wife of almost 53 years in December. He reports that he does some walking for exercise and to keep his mind busy. The patient denies any new symptoms such as chest discomfort, neck or arm discomfort. There has been no new shortness of breath, PND or orthopnea. There have been no reported palpitations, presyncope or syncope.   Past Medical History  Diagnosis Date  . Diabetes mellitus without complication ([place])   . Hypertension   . COPD (chronic obstructive pulmonary disease) ([place])   . Hyperlipidemia   . GERD (gastroesophageal reflux disease)     Past Surgical History  Procedure Laterality Date  . Thumb surgery       Current Outpatient Prescriptions  Medication Sig Dispense Refill  . amLODipine (NORVASC) 10 MG tablet Take 10 mg by mouth daily.    Marland Kitchen aspirin 325 MG tablet Take 325 mg by mouth daily.    Marland Kitchen BIOTIN 5000 PO Take 1 capsule by mouth daily.    . cholecalciferol (VITAMIN D) 1000 UNITS tablet Take 1,000 Units by mouth daily.    . fluticasone (FLONASE) 50 MCG/ACT nasal spray USE 2 SPRAYS IN EACH NOSTRIL DAILY 16 g 4  . lisinopril (PRINIVIL,ZESTRIL) 10 MG tablet Take 1 tablet (10 mg total) by mouth 2 (two) times daily. 60 tablet 11  . meloxicam (MOBIC) 7.5 MG tablet TAKE ONE TABLET BY MOUTH TWICE DAILY 60 tablet 2  . metFORMIN  (GLUMETZA) 1000 MG (MOD) 24 hr tablet Take 1,000 mg by mouth 2 (two) times daily with a meal.    . montelukast (SINGULAIR) 10 MG tablet Take 10 mg by mouth at bedtime.    . pravastatin (PRAVACHOL) 10 MG tablet Take 10 mg by mouth daily.    . ranitidine (ZANTAC) 150 MG tablet TAKE ONE TABLET BY MOUTH TWICE DAILY 60 tablet 4  . tadalafil (CIALIS) 5 MG tablet Take 5 mg by mouth daily as needed for erectile dysfunction.    Marland Kitchen glucose blood (ACCU-CHEK ACTIVE STRIPS) test strip Use as instructed 100 each 12  . Lancets (ACCU-CHEK SOFT TOUCH) lancets Test blood sugar qd. DX E11.9 100 each 5   No current facility-administered medications for this visit.    Allergies:   Penicillins    Social History:  The patient  reports that he has been smoking Cigarettes.  He has a 30 pack-year smoking history. He does not have any smokeless tobacco history on file. He reports that he does not drink alcohol or use illicit drugs.   Family History:  The patient's family history includes Cancer in his father; Diabetes in his sister; Healthy in his sister and sister; Stroke in his brother.    ROS:  Please see the history of present illness.   Otherwise, review of systems are positive for none.   All other  systems are reviewed and negative.    PHYSICAL EXAM: VS:  BP 170/80 mmHg  Pulse 84  Ht 6\' 2"  (1.88 m)  Wt 194 lb (87.998 kg)  BMI 24.90 kg/m2 , BMI Body mass index is 24.9 kg/(m^2). GENERAL:  Well appearing HEENT:  Pupils equal round and reactive, fundi not visualized, oral mucosa unremarkable NECK:  No jugular venous distention, waveform within normal limits, carotid upstroke brisk and symmetric, transmitted systolic murmur vs bilateral bruits, no thyromegaly LYMPHATICS:  No cervical, inguinal adenopathy LUNGS:  Clear to auscultation bilaterally BACK:  No CVA tenderness CHEST:  Unremarkable HEART:  PMI not displaced or sustained,S1 and S2 within normal limits, no S3, no S4, no clicks, no rubs, 2 out of 6  apical systolic murmur radiating slightly out the aorticflow tract, no diastolic murmurs ABD:  Flat, positive bowel sounds normal in frequency in pitch, no bruits, no rebound, no guarding, no midline pulsatile mass, no hepatomegaly, no splenomegaly EXT:  2 plus pulses throughout, no edema, no cyanosis no clubbing SKIN:  No rashes no nodules NEURO:  Cranial nerves II through XII grossly intact, motor grossly intact throughout PSYCH:  Cognitively intact, oriented to person place and time    EKG:  EKG is not ordered today. The ekg ordered today demonstrates sinus rhythm, rate 88, axis within normal limits, intervals within normal limits, no acute ST-T wave changes.    Recent Labs: 02/12/2016: ALT 13; BUN 15; Creatinine, Ser 0.76; Platelets 349; Potassium 4.4; Sodium 142    Lipid Panel    Component Value Date/Time   CHOL 162 02/12/2016 1517   CHOL 148 10/31/2014 0949   CHOL 150 04/11/2013 0000   TRIG 73 02/12/2016 1517   TRIG 108 10/31/2014 0949   TRIG 87 04/11/2013 0000   HDL 60 02/12/2016 1517   HDL 52 10/31/2014 0949   HDL 48 04/11/2013 0000   CHOLHDL 2.7 02/12/2016 1517   LDLCALC 87 02/12/2016 1517   LDLCALC 93 07/03/2014 0801   LDLCALC 85 04/11/2013 0000   Lab Results  Component Value Date   HGBA1C 7.4 07/28/2015     Wt Readings from Last 3 Encounters:  02/24/16 194 lb (87.998 kg)  02/12/16 200 lb (90.719 kg)  08/31/15 205 lb (92.987 kg)      Other studies Reviewed: Additional studies/ records that were reviewed today include: Previous echo and carotid Doppler. Review of the above records demonstrates:  Please see elsewhere in the note.     ASSESSMENT AND PLAN:  HTN:  The blood pressure is somewhat difficult to control. However, he might not taking the medicines as prescribed. We could not verify he was taking lisinopril. Therefore, I will restart this and if it's not controlling his blood pressure when he is seen back dose to be double.   RISK REDUCTION:  He  has significant cardiovascular risk factors. I would like to screen him with a POET (Plain Old Exercise Treadmill) once his blood pressure control.  CAROTID STENOSIS:  He had carotid stenosis moderate on Doppler in 2014. I will repeat one of these.   Current medicines are reviewed at length with the patient today.  The patient does not have concerns regarding medicines.  The following changes have been made:  As above  Labs/ tests ordered today include:   Orders Placed This Encounter  Procedures  . US Carotid Duplex Bilateral  . EKG 12-Lead     Disposition:   FU with me in about six weeks.  Signed, Minus Breeding, MD  02/24/2016 6:25 PM    [place]

## 2016-02-24 ENCOUNTER — Ambulatory Visit (INDEPENDENT_AMBULATORY_CARE_PROVIDER_SITE_OTHER): Payer: Medicare Other | Admitting: Cardiology

## 2016-02-24 ENCOUNTER — Encounter: Payer: Self-pay | Admitting: Cardiology

## 2016-02-24 VITALS — BP 170/80 | HR 84 | Ht 74.0 in | Wt 194.0 lb

## 2016-02-24 DIAGNOSIS — I779 Disorder of arteries and arterioles, unspecified: Secondary | ICD-10-CM | POA: Diagnosis not present

## 2016-02-24 DIAGNOSIS — I10 Essential (primary) hypertension: Secondary | ICD-10-CM

## 2016-02-24 DIAGNOSIS — I739 Peripheral vascular disease, unspecified: Secondary | ICD-10-CM

## 2016-02-24 MED ORDER — LISINOPRIL 10 MG PO TABS
10.0000 mg | ORAL_TABLET | Freq: Two times a day (BID) | ORAL | Status: DC
Start: 2016-02-24 — End: 2017-01-19

## 2016-02-24 NOTE — Patient Instructions (Signed)
Medication Instructions:  Please increase your Lisinopril to 10 mg twice a day. Continue all other medications as listed.  Testing/Procedures: Your physician has requested that you have a carotid duplex. This test is an ultrasound of the carotid arteries in your neck. It looks at blood flow through these arteries that supply the brain with blood. Allow one hour for this exam. There are no restrictions or special instructions.  This will be completed at [facility] on 03/09/2016.  Please check in at Radiology at 10 AM.  Follow-Up: Follow up with Dr Percival Spanish as scheduled.  If you need a refill on your cardiac medications before your next appointment, please call your pharmacy.  Thank you for choosing [facility]!!

## 2016-02-27 ENCOUNTER — Other Ambulatory Visit: Payer: Self-pay | Admitting: Family Medicine

## 2016-02-27 MED ORDER — ACCU-CHEK SOFT TOUCH LANCETS MISC: Status: DC

## 2016-02-27 NOTE — Telephone Encounter (Signed)
Pt needed test strips, he has ran out now that he is testing his blood sugars twice daily. Rx sent over for lancets and pt is aware.

## 2016-03-09 ENCOUNTER — Ambulatory Visit (HOSPITAL_COMMUNITY): Admission: RE | Admit: 2016-03-09 | Payer: Medicare Other | Source: Ambulatory Visit

## 2016-03-11 ENCOUNTER — Telehealth: Payer: Self-pay | Admitting: *Deleted

## 2016-03-11 MED ORDER — GLUCOSE BLOOD VI STRP: ORAL_STRIP | Status: DC

## 2016-03-11 NOTE — Telephone Encounter (Signed)
done

## 2016-03-13 ENCOUNTER — Other Ambulatory Visit: Payer: Self-pay | Admitting: Family Medicine

## 2016-03-14 ENCOUNTER — Ambulatory Visit: Payer: Medicare Other | Admitting: Family Medicine

## 2016-03-25 ENCOUNTER — Encounter: Payer: Self-pay | Admitting: Family Medicine

## 2016-03-25 ENCOUNTER — Ambulatory Visit (INDEPENDENT_AMBULATORY_CARE_PROVIDER_SITE_OTHER): Payer: Medicare Other | Admitting: Family Medicine

## 2016-03-25 VITALS — BP 159/84 | HR 91 | Temp 97.7°F | Ht 74.0 in | Wt 196.0 lb

## 2016-03-25 DIAGNOSIS — F4323 Adjustment disorder with mixed anxiety and depressed mood: Secondary | ICD-10-CM

## 2016-03-25 DIAGNOSIS — E11610 Type 2 diabetes mellitus with diabetic neuropathic arthropathy: Secondary | ICD-10-CM | POA: Diagnosis not present

## 2016-03-25 DIAGNOSIS — I10 Essential (primary) hypertension: Secondary | ICD-10-CM

## 2016-03-25 MED ORDER — GLUCOSE BLOOD VI STRP: ORAL_STRIP | Status: DC

## 2016-03-25 NOTE — Progress Notes (Signed)
Subjective:    [name], male    DOB: December 19, 1950, 65 y.o.   MRN: [id number]  HPI Patient here today for 4 week follow up on depression and anxiety. He is doing much better. The patient indicates he is doing much better. He was started on Lexapro 10 and he thinks this is helping him. He denies any chest pain shortness of breath trouble swallowing heartburn indigestion nausea vomiting diarrhea or blood in the stool. He does have a slight cut on the ring finger of his left hand with the finger joints the hand that he hurt this morning as he was coming to the office. We will clean and dressed this for him today. He is requesting refills on some of his medicines but especially the diabetic test strips. Recheck of the blood pressure when I went in the room was 144/78 and we will ask him to come by next week for blood pressure check by the nurse. He says he is upset because someone had stolen his check and he has to go report this.     Patient Active Problem List   Diagnosis Date Noted  . BPH (benign prostatic hyperplasia) 10/31/2014  . Systolic ejection murmur 0000000  . Bilateral carotid bruits 10/31/2014  . Erectile dysfunction 11/05/2013  . Bilateral carotid artery disease ([place]) 04/11/2013  . hyperlipidemia 04/11/2013  . HTN (hypertension) 04/11/2013  . COPD exacerbation ([place]) 04/11/2013  . Diabetes mellitus type 2 controlled 04/11/2013  . Diabetic neuropathy, type II diabetes mellitus ([place]) 03/15/2013   Outpatient Encounter Prescriptions as of 03/25/2016  Medication Sig  . amLODipine (NORVASC) 10 MG tablet TAKE ONE (1) TABLET EACH DAY  . aspirin 325 MG tablet Take 325 mg by mouth daily.  Marland Kitchen BIOTIN 5000 PO Take 1 capsule by mouth daily.  . cholecalciferol (VITAMIN D) 1000 UNITS tablet Take 1,000 Units by mouth daily.  . fluticasone (FLONASE) 50 MCG/ACT nasal spray USE 2 SPRAYS IN EACH NOSTRIL DAILY  . glucose blood (ACCU-CHEK AVIVA PLUS) test strip Test bid. Dx E11.9    . Lancets (ACCU-CHEK SOFT TOUCH) lancets Test blood sugar twice daily DX E11.9  . lisinopril (PRINIVIL,ZESTRIL) 10 MG tablet Take 1 tablet (10 mg total) by mouth 2 (two) times daily.  . meloxicam (MOBIC) 7.5 MG tablet TAKE ONE TABLET BY MOUTH TWICE DAILY  . metFORMIN (GLUMETZA) 1000 MG (MOD) 24 hr tablet Take 1,000 mg by mouth 2 (two) times daily with a meal.  . montelukast (SINGULAIR) 10 MG tablet Take 10 mg by mouth at bedtime.  . pravastatin (PRAVACHOL) 40 MG tablet TAKE ONE (1) TABLET EACH DAY  . ranitidine (ZANTAC) 150 MG tablet TAKE ONE TABLET BY MOUTH TWICE DAILY  . tadalafil (CIALIS) 5 MG tablet Take 5 mg by mouth daily as needed for erectile dysfunction.  Marland Kitchen escitalopram (LEXAPRO) 10 MG tablet Take 1 tablet by mouth daily.  . [DISCONTINUED] amLODipine (NORVASC) 10 MG tablet Take 10 mg by mouth daily.  . [DISCONTINUED] pravastatin (PRAVACHOL) 10 MG tablet Take 10 mg by mouth daily.   No facility-administered encounter medications on file as of 03/25/2016.      Review of Systems  Constitutional: Negative.   HENT: Negative.   Eyes: Negative.   Respiratory: Negative.   Cardiovascular: Negative.   Gastrointestinal: Negative.   Endocrine: Negative.   Genitourinary: Negative.   Musculoskeletal: Negative.   Skin: Negative.   Allergic/Immunologic: Negative.   Neurological: Negative.   Hematological: Negative.   Psychiatric/Behavioral: Negative.  Objective:   Physical Exam  Constitutional: He is oriented to person, place, and time. He appears well-developed and well-nourished. No distress.  HENT:  Head: Normocephalic and atraumatic.  Nose: Nose normal.  Eyes: Conjunctivae and EOM are normal. Pupils are equal, round, and reactive to light. Right eye exhibits no discharge. Left eye exhibits no discharge. No scleral icterus.  Neck: Normal range of motion. Neck supple. No thyromegaly present.  Cardiovascular: Normal rate, regular rhythm and normal heart sounds.   No murmur  heard. Pulmonary/Chest: Effort normal and breath sounds normal. No respiratory distress. He has no wheezes. He has no rales.  Abdominal: Soft. Bowel sounds are normal. He exhibits no mass. There is no tenderness. There is no rebound and no guarding.  Musculoskeletal: Normal range of motion. He exhibits no edema.  Neurological: He is alert and oriented to person, place, and time.  Skin: Skin is warm and dry. No rash noted.  The patient does have a slight tear of the ring finger where it joins the hand on the left hand. This will be cleaned and dressed this morning before he leaves office and he will be asked to return to the office and cancers any further problem with this.  Psychiatric: He has a normal mood and affect. His behavior is normal. Judgment and thought content normal.  Nursing note and vitals reviewed.  BP 159/84 mmHg  Pulse 91  Temp(Src) 97.7 F (36.5 C) (Oral)  Ht 6\' 2"  (1.88 m)  Wt 196 lb (88.905 kg)  BMI 25.15 kg/m2        Assessment & Plan:  1. Essential hypertension -The blood pressure is slightly elevated today and we will make no changes in his treatment but he will come by next week for one more recheck.  2. Type 2 diabetes mellitus with diabetic neuropathic arthropathy, without long-term current use of insulin ([place]) -Continue to watch aggressive therapeutic lifestyle changes and stay with current treatment  3. Adjustment reaction with anxiety and depression -Continue with Lexapro  Meds ordered this encounter  Medications  . escitalopram (LEXAPRO) 10 MG tablet    Sig: Take 1 tablet by mouth daily.  Marland Kitchen glucose blood (ACCU-CHEK AVIVA PLUS) test strip    Sig: Test bid. Dx E11.9    Dispense:  200 each    Refill:  6   Patient Instructions  Continue with Lexapro We will give him samples of Cialis 5 if we have any He should continue to stay active and follow-up aggressive therapeutic lifestyle changes He should come by in one week to have his blood pressure  rechecked by the nurse and continue to watch sodium intake    Arrie Senate MD

## 2016-03-25 NOTE — Patient Instructions (Signed)
Continue with Lexapro We will give him samples of Cialis 5 if we have any He should continue to stay active and follow-up aggressive therapeutic lifestyle changes He should come by in one week to have his blood pressure rechecked by the nurse and continue to watch sodium intake

## 2016-04-01 ENCOUNTER — Ambulatory Visit (INDEPENDENT_AMBULATORY_CARE_PROVIDER_SITE_OTHER): Payer: Medicare Other | Admitting: *Deleted

## 2016-04-01 ENCOUNTER — Encounter (INDEPENDENT_AMBULATORY_CARE_PROVIDER_SITE_OTHER): Payer: Self-pay

## 2016-04-01 VITALS — BP 190/82 | HR 78

## 2016-04-01 DIAGNOSIS — I10 Essential (primary) hypertension: Secondary | ICD-10-CM

## 2016-04-01 NOTE — Patient Instructions (Signed)
Hypertension Hypertension, commonly called high blood pressure, is when the force of blood pumping through your arteries is too strong. Your arteries are the blood vessels that carry blood from your heart throughout your body. A blood pressure reading consists of a higher number over a lower number, such as 110/72. The higher number (systolic) is the pressure inside your arteries when your heart pumps. The lower number (diastolic) is the pressure inside your arteries when your heart relaxes. Ideally you want your blood pressure below 120/80. Hypertension forces your heart to work harder to pump blood. Your arteries may become narrow or stiff. Having untreated or uncontrolled hypertension can cause heart attack, stroke, kidney disease, and other problems. RISK FACTORS Some risk factors for high blood pressure are controllable. Others are not.  Risk factors you cannot control include:   Race. You may be at higher risk if you are African American.  Age. Risk increases with age.  Gender. Men are at higher risk than women before age 45 years. After age 65, women are at higher risk than men. Risk factors you can control include:  Not getting enough exercise or physical activity.  Being overweight.  Getting too much fat, sugar, calories, or salt in your diet.  Drinking too much alcohol. SIGNS AND SYMPTOMS Hypertension does not usually cause signs or symptoms. Extremely high blood pressure (hypertensive crisis) may cause headache, anxiety, shortness of breath, and nosebleed. DIAGNOSIS To check if you have hypertension, your health care provider will measure your blood pressure while you are seated, with your arm held at the level of your heart. It should be measured at least twice using the same arm. Certain conditions can cause a difference in blood pressure between your right and left arms. A blood pressure reading that is higher than normal on one occasion does not mean that you need treatment. If  it is not clear whether you have high blood pressure, you may be asked to return on a different day to have your blood pressure checked again. Or, you may be asked to monitor your blood pressure at home for 1 or more weeks. TREATMENT Treating high blood pressure includes making lifestyle changes and possibly taking medicine. Living a healthy lifestyle can help lower high blood pressure. You may need to change some of your habits. Lifestyle changes may include:  Following the DASH diet. This diet is high in fruits, vegetables, and whole grains. It is low in salt, red meat, and added sugars.  Keep your sodium intake below 2,300 mg per day.  Getting at least 30-45 minutes of aerobic exercise at least 4 times per week.  Losing weight if necessary.  Not smoking.  Limiting alcoholic beverages.  Learning ways to reduce stress. Your health care provider may prescribe medicine if lifestyle changes are not enough to get your blood pressure under control, and if one of the following is true:  You are 18-59 years of age and your systolic blood pressure is above 140.  You are 60 years of age or older, and your systolic blood pressure is above 150.  Your diastolic blood pressure is above 90.  You have diabetes, and your systolic blood pressure is over 140 or your diastolic blood pressure is over 90.  You have kidney disease and your blood pressure is above 140/90.  You have heart disease and your blood pressure is above 140/90. Your personal target blood pressure may vary depending on your medical conditions, your age, and other factors. HOME CARE INSTRUCTIONS    Have your blood pressure rechecked as directed by your health care provider.   Take medicines only as directed by your health care provider. Follow the directions carefully. Blood pressure medicines must be taken as prescribed. The medicine does not work as well when you skip doses. Skipping doses also puts you at risk for  problems.  Do not smoke.   Monitor your blood pressure at home as directed by your health care provider. SEEK MEDICAL CARE IF:   You think you are having a reaction to medicines taken.  You have recurrent headaches or feel dizzy.  You have swelling in your ankles.  You have trouble with your vision. SEEK IMMEDIATE MEDICAL CARE IF:  You develop a severe headache or confusion.  You have unusual weakness, numbness, or feel faint.  You have severe chest or abdominal pain.  You vomit repeatedly.  You have trouble breathing. MAKE SURE YOU:   Understand these instructions.  Will watch your condition.  Will get help right away if you are not doing well or get worse.   This information is not intended to replace advice given to you by your health care provider. Make sure you discuss any questions you have with your health care provider.   Document Released: 11/07/2005 Document Revised: 03/24/2015 Document Reviewed: 08/30/2013 Elsevier Interactive Patient Education 2016 Elsevier Inc.  

## 2016-04-01 NOTE — Progress Notes (Signed)
Patient is already taking Lisinopril 10mg  BID. Please advise

## 2016-04-01 NOTE — Progress Notes (Signed)
Patient said he checked his BP at home and it was 115/72. Patient stated he wanted to wait to add medication. He states he will come back Monday and recheck bp and bring his machine with him.

## 2016-04-01 NOTE — Progress Notes (Signed)
Patient came in today for a bp check. Patients BP 190/82 mmHg  Pulse 78. Patient advised that I will send this over to Dr. Laurance Flatten to review and we will contact patient with any changes. Patient states that he has been under a lot of stress and that is why he feels his BP is elevated.

## 2016-04-13 ENCOUNTER — Ambulatory Visit: Payer: Medicare Other | Admitting: Cardiology

## 2016-04-18 ENCOUNTER — Other Ambulatory Visit: Payer: Self-pay | Admitting: Family Medicine

## 2016-05-20 ENCOUNTER — Other Ambulatory Visit: Payer: Self-pay | Admitting: Family Medicine

## 2016-06-16 ENCOUNTER — Other Ambulatory Visit: Payer: Self-pay | Admitting: Family Medicine

## 2016-06-24 LAB — HM DIABETES EYE EXAM

## 2016-07-01 ENCOUNTER — Encounter: Payer: Self-pay | Admitting: Family Medicine

## 2016-07-01 ENCOUNTER — Ambulatory Visit (INDEPENDENT_AMBULATORY_CARE_PROVIDER_SITE_OTHER): Payer: Medicare Other | Admitting: Family Medicine

## 2016-07-01 VITALS — BP 135/74 | HR 74 | Temp 98.6°F | Ht 74.0 in | Wt 188.0 lb

## 2016-07-01 DIAGNOSIS — E11610 Type 2 diabetes mellitus with diabetic neuropathic arthropathy: Secondary | ICD-10-CM | POA: Diagnosis not present

## 2016-07-01 DIAGNOSIS — E559 Vitamin D deficiency, unspecified: Secondary | ICD-10-CM

## 2016-07-01 DIAGNOSIS — F4323 Adjustment disorder with mixed anxiety and depressed mood: Secondary | ICD-10-CM

## 2016-07-01 DIAGNOSIS — N400 Enlarged prostate without lower urinary tract symptoms: Secondary | ICD-10-CM | POA: Diagnosis not present

## 2016-07-01 DIAGNOSIS — E1140 Type 2 diabetes mellitus with diabetic neuropathy, unspecified: Secondary | ICD-10-CM

## 2016-07-01 DIAGNOSIS — I10 Essential (primary) hypertension: Secondary | ICD-10-CM | POA: Diagnosis not present

## 2016-07-01 DIAGNOSIS — L84 Corns and callosities: Secondary | ICD-10-CM

## 2016-07-01 NOTE — Progress Notes (Addendum)
Subjective:    [name], male    DOB: 1951/01/05, 65 y.o.   MRN: [id number]  HPI Pt here for follow up and management of chronic medical problems which includes hypertension and diabetes. He is taking medications regularly.This patient has a history of carotid artery disease COPD and diabetes mellitus type 2 with diabetic neuropathy. He is on an ACE inhibitor for his hypertension. He is also on an antidepressant as his wife died during the past year. The patient has no particular complaints. He is due to have his lab work checked today. His weight is down 8 pounds since the last visit. The patient indicates that he had an in home visit to check his eyes. I'm not sure what he is referring to. He is active and has had no chest pain or shortness of breath. He denies any problems with his stomach like nausea vomiting diarrhea or blood in the stool. He does have occasional heartburn and takes Tums for this. He has no trouble passing his water. He does have a callus on the lateral plantar surface of his right foot and says he has had this for years. He also says that his diabetic shoes have worn out and he needs a new pair to help protect his feet. He also has seen the cardiologist in the past and has a systolic ejection murmur that radiates to both carotids. After reviewing the records his last echocardiogram if he had one was in March and 15. He does check his blood sugars at home and they run between 120 and 1:30.     Patient Active Problem List   Diagnosis Date Noted  . BPH (benign prostatic hyperplasia) 10/31/2014  . Systolic ejection murmur 12/75/1700  . Bilateral carotid bruits 10/31/2014  . Erectile dysfunction 11/05/2013  . Bilateral carotid artery disease ([place]) 04/11/2013  . hyperlipidemia 04/11/2013  . HTN (hypertension) 04/11/2013  . COPD exacerbation ([place]) 04/11/2013  . Diabetes mellitus type 2 controlled 04/11/2013  . Diabetic neuropathy, type II diabetes mellitus  ([place]) 03/15/2013   Outpatient Encounter Prescriptions as of 07/01/2016  Medication Sig  . amLODipine (NORVASC) 10 MG tablet TAKE ONE (1) TABLET EACH DAY  . aspirin 325 MG tablet Take 325 mg by mouth daily.  Marland Kitchen BIOTIN 5000 PO Take 1 capsule by mouth daily.  . cholecalciferol (VITAMIN D) 1000 UNITS tablet Take 1,000 Units by mouth daily.  Marland Kitchen escitalopram (LEXAPRO) 10 MG tablet TAKE ONE (1) TABLET EACH DAY  . glucose blood (ACCU-CHEK AVIVA PLUS) test strip Test bid. Dx E11.9  . Lancets (ACCU-CHEK SOFT TOUCH) lancets Test blood sugar twice daily DX E11.9  . lisinopril (PRINIVIL,ZESTRIL) 10 MG tablet Take 1 tablet (10 mg total) by mouth 2 (two) times daily.  . metFORMIN (GLUMETZA) 1000 MG (MOD) 24 hr tablet Take 1,000 mg by mouth 2 (two) times daily with a meal.  . montelukast (SINGULAIR) 10 MG tablet Take 10 mg by mouth at bedtime.  . pravastatin (PRAVACHOL) 40 MG tablet TAKE ONE (1) TABLET EACH DAY  . ranitidine (ZANTAC) 150 MG tablet TAKE ONE TABLET BY MOUTH TWICE DAILY  . [DISCONTINUED] fluticasone (FLONASE) 50 MCG/ACT nasal spray USE 2 SPRAYS IN EACH NOSTRIL DAILY  . tadalafil (CIALIS) 5 MG tablet Take 5 mg by mouth daily as needed for erectile dysfunction.  . [DISCONTINUED] escitalopram (LEXAPRO) 10 MG tablet Take 1 tablet by mouth daily.  . [DISCONTINUED] meloxicam (MOBIC) 7.5 MG tablet TAKE ONE TABLET BY MOUTH TWICE DAILY  No facility-administered encounter medications on file as of 07/01/2016.       Review of Systems  Constitutional: Negative.   HENT: Negative.   Eyes: Negative.   Respiratory: Negative.   Cardiovascular: Negative.   Gastrointestinal: Negative.   Endocrine: Negative.   Genitourinary: Negative.   Musculoskeletal: Negative.   Skin: Negative.   Allergic/Immunologic: Negative.   Neurological: Negative.   Hematological: Negative.   Psychiatric/Behavioral: Negative.        Objective:   Physical Exam  Constitutional: He is oriented to person, place, and time. He  appears well-developed and well-nourished. No distress.  HENT:  Head: Normocephalic and atraumatic.  Right Ear: External ear normal.  Left Ear: External ear normal.  Nose: Nose normal.  Mouth/Throat: Oropharynx is clear and moist. No oropharyngeal exudate.  Edentulous  Eyes: Conjunctivae and EOM are normal. Pupils are equal, round, and reactive to light. Right eye exhibits no discharge. Left eye exhibits no discharge. No scleral icterus.  Patient has some kind of in home exam and he will send [place] a copy of that report if he gets a copy.  Neck: Normal range of motion. Neck supple. No thyromegaly present.  Cardiovascular: Normal rate, regular rhythm and intact distal pulses.  Exam reveals no gallop and no friction rub.   Murmur heard. The patient has a grade 2 to 3/6 systolic ejection murmur that radiates to both carotids.  Pulmonary/Chest: Effort normal and breath sounds normal. No respiratory distress. He has no wheezes. He has no rales. He exhibits no tenderness.  Clear anteriorly and posteriorly and no axillary adenopathy  Abdominal: Soft. Bowel sounds are normal. He exhibits no mass. There is no tenderness. There is no rebound and no guarding.  No abdominal tenderness liver or spleen enlargement or suprapubic tenderness. No bruits.  Musculoskeletal: Normal range of motion. He exhibits no edema or deformity.  Lymphadenopathy:    He has no cervical adenopathy.  Neurological: He is alert and oriented to person, place, and time. He has normal reflexes. No cranial nerve deficit.  Skin: Skin is warm and dry. No rash noted.  Psychiatric: He has a normal mood and affect. His behavior is normal. Judgment and thought content normal.  Nursing note and vitals reviewed.  BP 135/74 (BP Location: Left Arm)   Pulse 74   Temp 98.6 F (37 C) (Oral)   Ht _0  (1.88 m)   Wt 188 lb (85.3 kg)   BMI 24.14 kg/m         Assessment & Plan:  1. Essential hypertension -The blood pressure is good  today and he will continue with current treatment - BMP8+EGFR; Future - CBC with Differential/Platelet; Future - Hepatic function panel; Future - Lipid panel; Future  2. Type 2 diabetes mellitus with diabetic neuropathic arthropathy, without long-term current use of insulin ([facility]) -Home blood sugars have been running in the 120 to 1:30 range according to the patient. Until his lab work is returned he should continue with current treatment. He plans to come back to get the fasting lab work. - Bayer DCA Hb A1c Waived; Future - CBC with Differential/Platelet; Future - Lipid panel; Future  3. Vitamin D deficiency -Continue vitamin D replacement pending results of lab work - CBC with Differential/Platelet; Future - VITAMIN D 25 Hydroxy (Vit-D Deficiency, Fractures); Future  4. BPH (benign prostatic hyperplasia) -The patient does not complain of any voiding symptoms. - CBC with Differential/Platelet; Future  5. Adjustment reaction with anxiety and depression -He seems to be doing  better and is adjusting better to the loss of his wife. - CBC with Differential/Platelet; Future  6. Foot callus -The patient has a large callus on the plantar surface laterally of the right foot.  7. Diabetic neuropathy - decreased sensation bilateral feet.   Patient Instructions                       Medicare Annual Wellness Visit  [facility] and the medical providers at [facility] strive to bring you the best medical care.  In doing so we not only want to address your current medical conditions and concerns but also to detect new conditions early and prevent illness, disease and health-related problems.    Medicare offers a yearly Wellness Visit which allows our clinical staff to assess your need for preventative services including immunizations, lifestyle education, counseling to decrease risk of preventable diseases and screening for fall risk and other medical concerns.    This  visit is provided free of charge (no copay) for all Medicare recipients. The clinical pharmacists at [facility] have begun to conduct these Wellness Visits which will also include a thorough review of all your medications.    As you primary medical provider recommend that you make an appointment for your Annual Wellness Visit if you have not done so already this year.  You may set up this appointment before you leave today or you may call back [phone]) and schedule an appointment.  Please make sure when you call that you mention that you are scheduling your Annual Wellness Visit with the clinical pharmacist so that the appointment may be made for the proper length of time.    Continue current medications. Continue good therapeutic lifestyle changes which include good diet and exercise. Fall precautions discussed with patient. If an FOBT was given today- please return it to our front desk. If you are over 21 years old - you may need Prevnar 20 or the adult Pneumonia vaccine.   After your visit with [place] today you will receive a survey in the mail or online from [employer] regarding your care with [place]. Please take a moment to fill this out. Your feedback is very important to [place] as you can help [place] better understand your patient needs as well as improve your experience and satisfaction. WE CARE ABOUT YOU!!!   The patient should follow up periodically with the podiatrist We will give him a prescription for some diabetic shoes He should also schedule a revisit with the cardiologist He should bring a copy of any records he has regarding the in-home eye exam He should check blood pressures at home and record these on the paper that was given to him and bring them to the next visit. Watch sodium intake    Arrie Senate MD

## 2016-07-01 NOTE — Patient Instructions (Addendum)
Medicare Annual Wellness Visit  [facility] and the medical providers at [facility] strive to bring you the best medical care.  In doing so we not only want to address your current medical conditions and concerns but also to detect new conditions early and prevent illness, disease and health-related problems.    Medicare offers a yearly Wellness Visit which allows our clinical staff to assess your need for preventative services including immunizations, lifestyle education, counseling to decrease risk of preventable diseases and screening for fall risk and other medical concerns.    This visit is provided free of charge (no copay) for all Medicare recipients. The clinical pharmacists at [facility] have begun to conduct these Wellness Visits which will also include a thorough review of all your medications.    As you primary medical provider recommend that you make an appointment for your Annual Wellness Visit if you have not done so already this year.  You may set up this appointment before you leave today or you may call back [phone] and schedule an appointment.  Please make sure when you call that you mention that you are scheduling your Annual Wellness Visit with the clinical pharmacist so that the appointment may be made for the proper length of time.    Continue current medications. Continue good therapeutic lifestyle changes which include good diet and exercise. Fall precautions discussed with patient. If an FOBT was given today- please return it to our front desk. If you are over 51 years old - you may need Prevnar 5 or the adult Pneumonia vaccine.   After your visit with [place] today you will receive a survey in the mail or online from [employer] regarding your care with [place]. Please take a moment to fill this out. Your feedback is very important to [place] as you can help [place] better understand your patient needs as well as improve  your experience and satisfaction. WE CARE ABOUT YOU!!!   The patient should follow up periodically with the podiatrist We will give him a prescription for some diabetic shoes He should also schedule a revisit with the cardiologist He should bring a copy of any records he has regarding the in-home eye exam He should check blood pressures at home and record these on the paper that was given to him and bring them to the next visit. Watch sodium intake

## 2016-07-01 NOTE — Addendum Note (Signed)
Addended by: Zannie Cove on: 07/01/2016 04:14 PM   Modules accepted: Orders

## 2016-07-01 NOTE — Addendum Note (Signed)
Addended by: Zannie Cove on: 07/01/2016 03:08 PM   Modules accepted: Orders

## 2016-07-13 ENCOUNTER — Telehealth: Payer: Self-pay | Admitting: Family Medicine

## 2016-07-14 NOTE — Telephone Encounter (Signed)
Spoke to [place] at [employer]. I have the forms and will get them to her by tomorrow

## 2016-07-16 ENCOUNTER — Other Ambulatory Visit: Payer: Medicare Other

## 2016-07-16 DIAGNOSIS — I10 Essential (primary) hypertension: Secondary | ICD-10-CM | POA: Diagnosis not present

## 2016-07-16 DIAGNOSIS — E11610 Type 2 diabetes mellitus with diabetic neuropathic arthropathy: Secondary | ICD-10-CM | POA: Diagnosis not present

## 2016-07-16 DIAGNOSIS — N400 Enlarged prostate without lower urinary tract symptoms: Secondary | ICD-10-CM | POA: Diagnosis not present

## 2016-07-16 DIAGNOSIS — E559 Vitamin D deficiency, unspecified: Secondary | ICD-10-CM

## 2016-07-16 DIAGNOSIS — F4323 Adjustment disorder with mixed anxiety and depressed mood: Secondary | ICD-10-CM

## 2016-07-16 LAB — BAYER DCA HB A1C WAIVED: HB A1C (BAYER DCA - WAIVED): 8 % — ABNORMAL HIGH (ref <7.0)

## 2016-07-18 LAB — CBC WITH DIFFERENTIAL/PLATELET
BASOS ABS: 0 10*3/uL (ref 0.0–0.2)
Basos: 0 %
EOS (ABSOLUTE): 0.1 10*3/uL (ref 0.0–0.4)
Eos: 1 %
HEMATOCRIT: 40 % (ref 37.5–51.0)
Hemoglobin: 13.3 g/dL (ref 12.6–17.7)
Immature Grans (Abs): 0 10*3/uL (ref 0.0–0.1)
Immature Granulocytes: 0 %
LYMPHS ABS: 1.8 10*3/uL (ref 0.7–3.1)
Lymphs: 19 %
MCH: 30.9 pg (ref 26.6–33.0)
MCHC: 33.3 g/dL (ref 31.5–35.7)
MCV: 93 fL (ref 79–97)
MONOS ABS: 0.6 10*3/uL (ref 0.1–0.9)
Monocytes: 6 %
NEUTROS ABS: 7.3 10*3/uL — AB (ref 1.4–7.0)
Neutrophils: 74 %
Platelets: 329 10*3/uL (ref 150–379)
RBC: 4.3 x10E6/uL (ref 4.14–5.80)
RDW: 13.7 % (ref 12.3–15.4)
WBC: 9.9 10*3/uL (ref 3.4–10.8)

## 2016-07-18 LAB — BMP8+EGFR
BUN / CREAT RATIO: 19 (ref 10–24)
BUN: 16 mg/dL (ref 8–27)
CHLORIDE: 102 mmol/L (ref 96–106)
CO2: 26 mmol/L (ref 18–29)
Calcium: 9.6 mg/dL (ref 8.6–10.2)
Creatinine, Ser: 0.83 mg/dL (ref 0.76–1.27)
GFR calc Af Amer: 107 mL/min/{1.73_m2} (ref >59)
GFR calc non Af Amer: 92 mL/min/{1.73_m2} (ref >59)
GLUCOSE: 165 mg/dL — AB (ref 65–99)
Potassium: 4.2 mmol/L (ref 3.5–5.2)
SODIUM: 142 mmol/L (ref 134–144)

## 2016-07-18 LAB — LIPID PANEL
CHOLESTEROL TOTAL: 154 mg/dL (ref 100–199)
Chol/HDL Ratio: 2.5 ratio units (ref 0.0–5.0)
HDL: 61 mg/dL (ref >39)
LDL CALC: 80 mg/dL (ref 0–99)
Triglycerides: 63 mg/dL (ref 0–149)
VLDL CHOLESTEROL CAL: 13 mg/dL (ref 5–40)

## 2016-07-18 LAB — HEPATIC FUNCTION PANEL
ALT: 7 IU/L (ref 0–44)
AST: 9 IU/L (ref 0–40)
Albumin: 4.2 g/dL (ref 3.6–4.8)
Alkaline Phosphatase: 70 IU/L (ref 39–117)
Bilirubin Total: <0.2 mg/dL (ref 0.0–1.2)
Bilirubin, Direct: 0.09 mg/dL (ref 0.00–0.40)
Total Protein: 6 g/dL (ref 6.0–8.5)

## 2016-07-18 LAB — VITAMIN D 25 HYDROXY (VIT D DEFICIENCY, FRACTURES): Vit D, 25-Hydroxy: 49.7 ng/mL (ref 30.0–100.0)

## 2016-07-19 ENCOUNTER — Other Ambulatory Visit: Payer: Self-pay | Admitting: Family Medicine

## 2016-07-20 ENCOUNTER — Other Ambulatory Visit: Payer: Self-pay | Admitting: Pharmacist

## 2016-07-20 ENCOUNTER — Encounter: Payer: Self-pay | Admitting: Pharmacist

## 2016-07-20 ENCOUNTER — Ambulatory Visit (INDEPENDENT_AMBULATORY_CARE_PROVIDER_SITE_OTHER): Payer: Medicare Other | Admitting: Pharmacist

## 2016-07-20 VITALS — BP 138/77 | HR 75 | Ht 74.0 in | Wt 180.0 lb

## 2016-07-20 DIAGNOSIS — E11610 Type 2 diabetes mellitus with diabetic neuropathic arthropathy: Secondary | ICD-10-CM | POA: Diagnosis not present

## 2016-07-20 MED ORDER — SITAGLIPTIN PHOSPHATE 100 MG PO TABS: 100.0000 mg | ORAL_TABLET | Freq: Every day | ORAL | 1 refills | Status: DC

## 2016-07-20 NOTE — Patient Instructions (Addendum)
Diabetes and Standards of Medical Care   Diabetes is complicated. You may find that your diabetes team includes a dietitian, nurse, diabetes educator, eye doctor, and more. To help everyone know what is going on and to help you get the care you deserve, the following schedule of care was developed to help keep you on track. Below are the tests, exams, vaccines, medicines, education, and plans you will need.  Blood Glucose Goals Prior to meals = 80 - 130 Within 2 hours of the start of a meal = less than 180  HbA1c test (goal is less than 7.0% - your last value was 8.0%) This test shows how well you have controlled your glucose over the past 2 to 3 months. It is used to see if your diabetes management plan needs to be adjusted.   It is performed at least 2 times a year if you are meeting treatment goals.  It is performed 4 times a year if therapy has changed or if you are not meeting treatment goals.  Blood pressure test  This test is performed at every routine medical visit. The goal is less than 140/90 mmHg for most people, but 130/80 mmHg in some cases. Ask your health care provider about your goal.  Dental exam  Follow up with the dentist regularly.  Eye exam  If you are diagnosed with type 1 diabetes as a child, get an exam upon reaching the age of 10 years or older and have had diabetes for 3 to 5 years. Yearly eye exams are recommended after that initial eye exam.  If you are diagnosed with type 1 diabetes as an adult, get an exam within 5 years of diagnosis and then yearly.  If you are diagnosed with type 2 diabetes, get an exam as soon as possible after the diagnosis and then yearly.  Foot care exam  Visual foot exams are performed at every routine medical visit. The exams check for cuts, injuries, or other problems with the feet.  A comprehensive foot exam should be done yearly. This includes visual inspection as well as assessing foot pulses and testing for loss of  sensation.  Check your feet nightly for cuts, injuries, or other problems with your feet. Tell your health care provider if anything is not healing.  Kidney function test (urine microalbumin)  This test is performed once a year.  Type 1 diabetes: The first test is performed 5 years after diagnosis.  Type 2 diabetes: The first test is performed at the time of diagnosis.  A serum creatinine and estimated glomerular filtration rate (eGFR) test is done once a year to assess the level of chronic kidney disease (CKD), if present.  Lipid profile (cholesterol, HDL, LDL, triglycerides)  Performed every 5 years for most people.  The goal for LDL is less than 100 mg/dL. If you are at high risk, the goal is less than 70 mg/dL.  The goal for HDL is 40 mg/dL to 50 mg/dL for men and 50 mg/dL to 60 mg/dL for women. An HDL cholesterol of 60 mg/dL or higher gives some protection against heart disease.  The goal for triglycerides is less than 150 mg/dL.  Influenza vaccine, pneumococcal vaccine, and hepatitis B vaccine  The influenza vaccine is recommended yearly.  The pneumococcal vaccine is generally given once in a lifetime. However, there are some instances when another vaccination is recommended. Check with your health care provider.  The hepatitis B vaccine is also recommended for adults with diabetes.    Diabetes self-management education  Education is recommended at diagnosis and ongoing as needed.  Treatment plan  Your treatment plan is reviewed at every medical visit.  Document Released: 09/04/2009 Document Revised: 07/10/2013 Document Reviewed: 04/09/2013 Exit[employer] Patient Information 2014 [employer].   

## 2016-07-20 NOTE — Progress Notes (Signed)
[name], male   DOB: 10-04-1951, 65 y.o.   MRN: [id number]  Subjective:    Omar Todd is a 65 y.o. male who presents for an innitial evaluation of Type 2 diabetes mellitus.  Current symptoms/problems include hyperglycemia and have been worsening. Symptoms have been present for 3 months. Mr. Demski was initially diagnosed with DM about 5 to 6 years ago.  He is currently taking only metformin 1000mg  bid with food.  He reported 100% compliance.  Known diabetic complications: peripheral neuropathy and cardiovascular disease Cardiovascular risk factors: advanced age (older than 25 for men, 49 for women), diabetes mellitus, dyslipidemia, hypertension, male gender and smoking/ tobacco exposure  Prior visit with CDE: no Current diet: diabetic, low salt Current exercise: walking Medication Compliance?  Yes  Current monitoring regimen: home blood tests - 1-2 times daily Home blood sugar records: per patietn was 120 this am and 130 after dinner last night Any episodes of hypoglycemia? no  Is He on ACE inhibitor or angiotensin II receptor blocker?  Yes  lisinopril (generic)      Objective:    BP 138/77   Pulse 75   Ht 6\' 2"  (1.88 m)   Wt 180 lb (81.6 kg)   BMI 23.11 kg/m   A1c increased over last 5 months from 7.5% to 8.0% (06/30/2016)  Lab Review Glucose (mg/dL)  Date Value  07/16/2016 165 (H)  02/12/2016 206 (H)  07/28/2015 106 (H)   Glucose, Bld (mg/dL)  Date Value  04/11/2013 115 (H)   CO2 (mmol/L)  Date Value  07/16/2016 26  02/12/2016 26  07/28/2015 25   BUN (mg/dL)  Date Value  07/16/2016 16  02/12/2016 15  07/28/2015 17   Creat (mg/dL)  Date Value  04/11/2013 0.88   Creatinine, Ser (mg/dL)  Date Value  07/16/2016 0.83  02/12/2016 0.76  07/28/2015 0.79     Assessment:    Diabetes Mellitus type II, under inadequate control.    Plan:    1.  Rx changes: add Januvia 100mg  take 1 tablet daily.  Pt given Rx and coupon for #30  free. 2.  Education: Reviewed 'ABCs' of diabetes management (respective goals in parentheses):  A1C (<7), blood pressure (<130/80), and cholesterol (LDL <100). 3. Discussed pathophysiology of DM; difference between type 1 and type 2 DM. 4. CHO counting diet discussed.  Reviewed CHO amount in various foods and how to read nutrition labels.  Discussed recommended serving sizes.  5.  Recommend check BG 1-2  times a day 6.  Recommended increase physical activity - goal is 150 minutes per week 7. Follow up: 4 weeks

## 2016-08-04 DIAGNOSIS — L84 Corns and callosities: Secondary | ICD-10-CM | POA: Diagnosis not present

## 2016-08-04 DIAGNOSIS — E1140 Type 2 diabetes mellitus with diabetic neuropathy, unspecified: Secondary | ICD-10-CM | POA: Diagnosis not present

## 2016-08-13 ENCOUNTER — Other Ambulatory Visit: Payer: Self-pay | Admitting: Family Medicine

## 2016-08-17 ENCOUNTER — Ambulatory Visit: Payer: Self-pay | Admitting: Pharmacist

## 2016-08-18 ENCOUNTER — Ambulatory Visit: Payer: Self-pay | Admitting: Pharmacist

## 2016-08-24 ENCOUNTER — Encounter: Payer: Self-pay | Admitting: Cardiology

## 2016-08-29 ENCOUNTER — Encounter: Payer: Self-pay | Admitting: Family Medicine

## 2016-09-06 NOTE — Progress Notes (Signed)
Cardiology Office Note   Date:  09/07/2016   ID:  Omar Todd, DOB [birth year], MRN [id number]  PCP:  Omar Gainer, MD  Cardiologist:   Omar Breeding, MD   No chief complaint on file.     History of Present Illness: Omar Todd is a 65 y.o. male who presents for evaluation of difficult to control hypertension. He has significant cardiovascular risk factors as well. He's also had some nonobstructive carotid disease in the past. He has a heart murmur. Echocardiogram that was done in 2015 demonstrated aortic sclerosis.   At the last visit I had suggested a Carotid Doppler and POET (Plain Old Exercise Treadmill) .  However, he did not do this.  The patient denies any new symptoms such as chest discomfort, neck or arm discomfort. There has been no new shortness of breath, PND or orthopnea. There have been no reported palpitations, presyncope or syncope. He says that he walks four miles per day.   He cut his hand and it got very infected.  He is being treated for this.   Past Medical History:  Diagnosis Date  . COPD (chronic obstructive pulmonary disease) ([place])   . Diabetes mellitus without complication ([place])   . GERD (gastroesophageal reflux disease)   . Hyperlipidemia   . Hypertension     Past Surgical History:  Procedure Laterality Date  . Thumb surgery       Current Outpatient Prescriptions  Medication Sig Dispense Refill  . amLODipine (NORVASC) 10 MG tablet Take 10 mg by mouth daily.    Marland Kitchen aspirin EC 81 MG tablet Take 81 mg by mouth daily.    Marland Kitchen BIOTIN 5000 PO Take 1 capsule by mouth daily.    . cholecalciferol (VITAMIN D) 1000 UNITS tablet Take 1,000 Units by mouth daily.    . ciprofloxacin (CIPRO) 500 MG tablet Take 1 tablet (500 mg total) by mouth 2 (two) times daily. 30 tablet 0  . escitalopram (LEXAPRO) 10 MG tablet Take 10 mg by mouth daily.    Marland Kitchen lisinopril (PRINIVIL,ZESTRIL) 10 MG tablet Take 1 tablet (10 mg total) by mouth 2 (two) times daily. 60 tablet 11  .  meloxicam (MOBIC) 7.5 MG tablet TAKE ONE TABLET BY MOUTH TWICE DAILY 60 tablet 1  . metFORMIN (GLUCOPHAGE) 1000 MG tablet TAKE ONE TABLET TWICE A DAY WITH FOOD 60 tablet 1  . montelukast (SINGULAIR) 10 MG tablet Take 10 mg by mouth at bedtime.    . pravastatin (PRAVACHOL) 40 MG tablet Take 40 mg by mouth daily.    . ranitidine (ZANTAC) 150 MG tablet TAKE ONE TABLET BY MOUTH TWICE DAILY 60 tablet 4  . [name] (JANUVIA) 100 MG tablet Take 100 mg by mouth daily.    . tadalafil (CIALIS) 5 MG tablet Take 5 mg by mouth daily as needed for erectile dysfunction.    Marland Kitchen glucose blood (ACCU-CHEK AVIVA PLUS) test strip Test bid. Dx E11.9 200 each 6  . Lancets (ACCU-CHEK SOFT TOUCH) lancets Test blood sugar twice daily DX E11.9 200 each 5   Current Facility-Administered Medications  Medication Dose Route Frequency Provider Last Rate Last Dose  . cefTRIAXone (ROCEPHIN) injection 1 g  1 g Intramuscular Once Claretta Fraise, MD        Allergies:   Penicillins    ROS:  Please see the history of present illness.   Otherwise, review of systems are positive for none.   All other systems are reviewed and negative.    PHYSICAL  EXAM: VS:  BP (!) 178/80   Pulse 86   Ht 6\' 2"  (1.88 m)   Wt 189 lb (85.7 kg)   BMI 24.27 kg/m  , BMI Body mass index is 24.27 kg/m. GENERAL:  Well appearing NECK:  No jugular venous distention, waveform within normal limits, carotid upstroke brisk and symmetric, transmitted systolic murmur vs bilateral bruits, no thyromegaly LUNGS:  Clear to auscultation bilaterally BACK:  No CVA tenderness CHEST:  Unremarkable HEART:  PMI not displaced or sustained,S1 and S2 within normal limits, no S3, no S4, no clicks, no rubs, 2 out of 6 apical systolic murmur radiating slightly out the aorticflow tract, no diastolic murmurs ABD:  Flat, positive bowel sounds normal in frequency in pitch, no bruits, no rebound, no guarding, no midline pulsatile mass, no hepatomegaly, no splenomegaly EXT:  2  plus pulses throughout, no edema, no cyanosis no clubbing, left hand infection.    EKG:  EKG is ordered today. The ekg ordered today demonstrates sinus rhythm, rate 86, axis within normal limits, intervals within normal limits, no acute ST-T wave changes non specific inferior T wave changes. .    Recent Labs: 07/16/2016: ALT 7; BUN 16; Creatinine, Ser 0.83; Platelets 329; Potassium 4.2; Sodium 142    Lipid Panel    Component Value Date/Time   CHOL 154 07/16/2016 1118   CHOL 150 04/11/2013 0000   TRIG 63 07/16/2016 1118   TRIG 108 10/31/2014 0949   TRIG 87 04/11/2013 0000   HDL 61 07/16/2016 1118   HDL 52 10/31/2014 0949   HDL 48 04/11/2013 0000   CHOLHDL 2.5 07/16/2016 1118   LDLCALC 80 07/16/2016 1118   LDLCALC 93 07/03/2014 0801   LDLCALC 85 04/11/2013 0000   Lab Results  Component Value Date   HGBA1C 7.4 07/28/2015     Wt Readings from Last 3 Encounters:  09/07/16 189 lb (85.7 kg)  09/07/16 188 lb (85.3 kg)  07/20/16 180 lb (81.6 kg)      Other studies Reviewed: Additional studies/ records that were reviewed today include: None Review of the above records demonstrates:     ASSESSMENT AND PLAN:  HTN:  The blood pressure is somewhat difficult to control. However, he doesn't want to take any other meds and says that it is well controlled at home. Marland Kitchen   RISK REDUCTION:  He has significant cardiovascular risk factors. I would like to screen him with a POET (Plain Old Exercise Treadmill).  However, he does not want to have this done.   CAROTID STENOSIS:  He had carotid stenosis moderate on Doppler in 2014.  He was to have carotid Doppler.  I think that I have convinced him to have this study repeated.    AS:  He has mild AS.  I will follow this clinically.    Current medicines are reviewed at length with the patient today.  The patient does not have concerns regarding medicines.  The following changes have been made:  None  Labs/ tests ordered today include:     No orders of the defined types were placed in this encounter.    Disposition:   FU with me in 12 months.       Signed, Omar Breeding, MD  09/07/2016 4:33 PM    Omar Hamilton

## 2016-09-07 ENCOUNTER — Ambulatory Visit (INDEPENDENT_AMBULATORY_CARE_PROVIDER_SITE_OTHER): Payer: Medicare Other | Admitting: Cardiology

## 2016-09-07 ENCOUNTER — Ambulatory Visit (INDEPENDENT_AMBULATORY_CARE_PROVIDER_SITE_OTHER): Payer: Medicare Other

## 2016-09-07 ENCOUNTER — Ambulatory Visit (INDEPENDENT_AMBULATORY_CARE_PROVIDER_SITE_OTHER): Payer: Medicare Other | Admitting: Family Medicine

## 2016-09-07 ENCOUNTER — Encounter: Payer: Self-pay | Admitting: Family Medicine

## 2016-09-07 ENCOUNTER — Telehealth: Payer: Self-pay | Admitting: Family Medicine

## 2016-09-07 ENCOUNTER — Encounter: Payer: Self-pay | Admitting: Cardiology

## 2016-09-07 VITALS — BP 178/80 | HR 86 | Ht 74.0 in | Wt 189.0 lb

## 2016-09-07 VITALS — BP 154/71 | HR 81 | Temp 98.5°F | Ht 74.0 in | Wt 188.0 lb

## 2016-09-07 DIAGNOSIS — I10 Essential (primary) hypertension: Secondary | ICD-10-CM

## 2016-09-07 DIAGNOSIS — L02512 Cutaneous abscess of left hand: Secondary | ICD-10-CM

## 2016-09-07 DIAGNOSIS — R0989 Other specified symptoms and signs involving the circulatory and respiratory systems: Secondary | ICD-10-CM | POA: Diagnosis not present

## 2016-09-07 DIAGNOSIS — S60455A Superficial foreign body of left ring finger, initial encounter: Secondary | ICD-10-CM | POA: Diagnosis not present

## 2016-09-07 IMAGING — DX DG HAND COMPLETE 3+V*L*
3 series · 3 of 3 positions shown · non-contrast
Comparison: None.

CLINICAL DATA: Pain and swelling of the left ring finger after
trauma. Abscess of the finger.

EXAM:
LEFT HAND - COMPLETE 3+ VIEW

[hand pa]
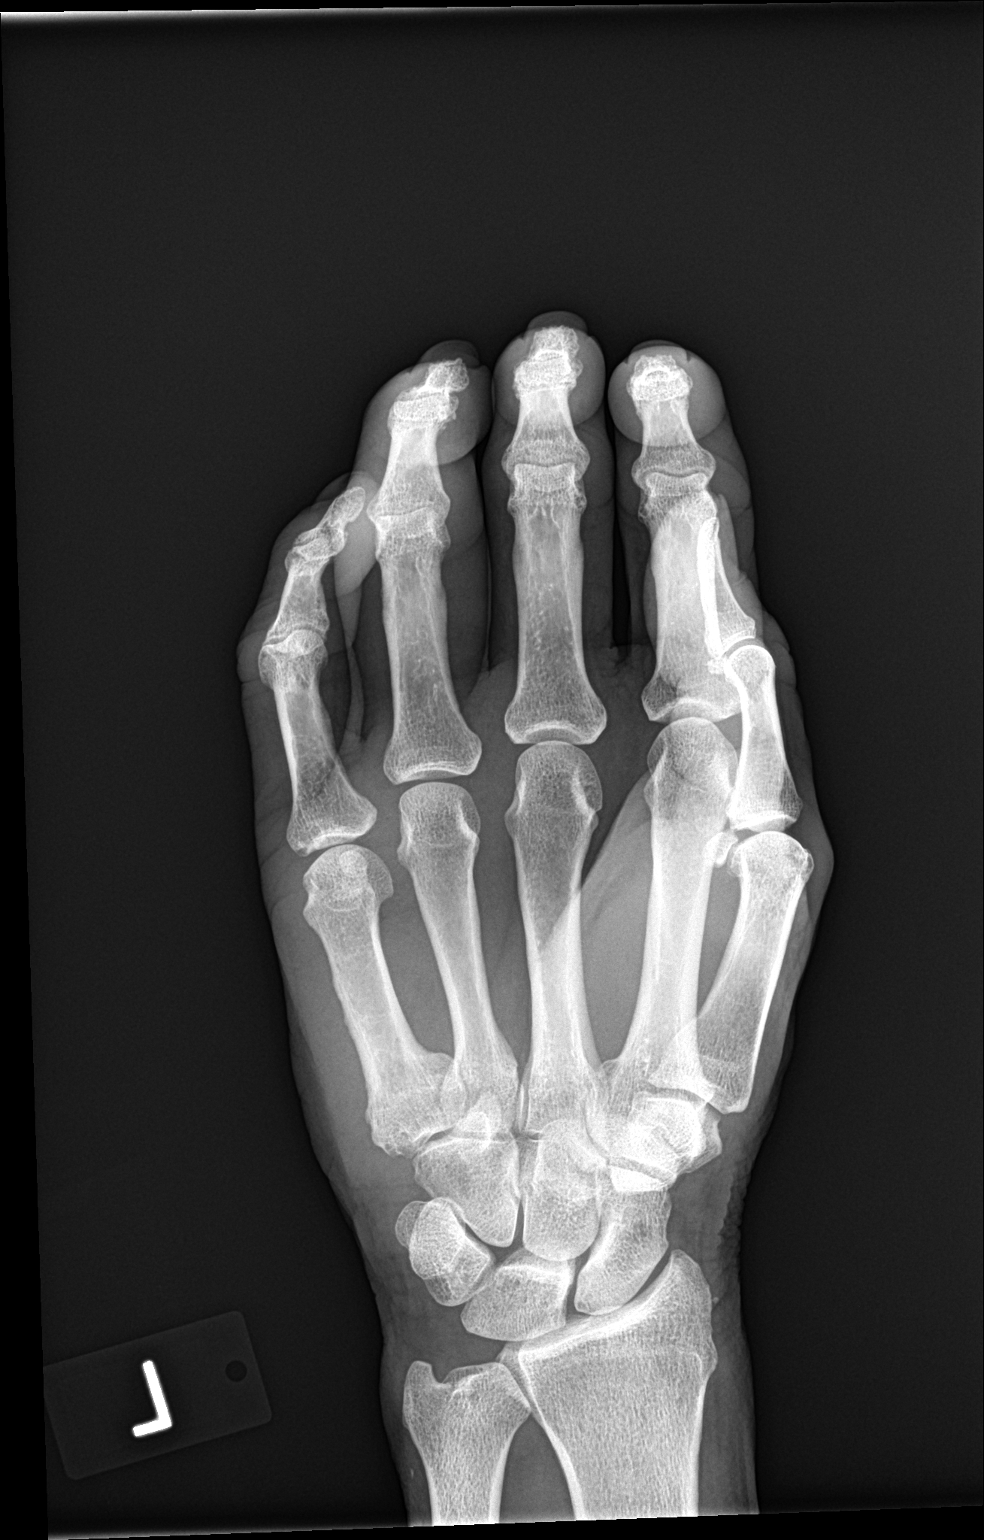

[hand obl]
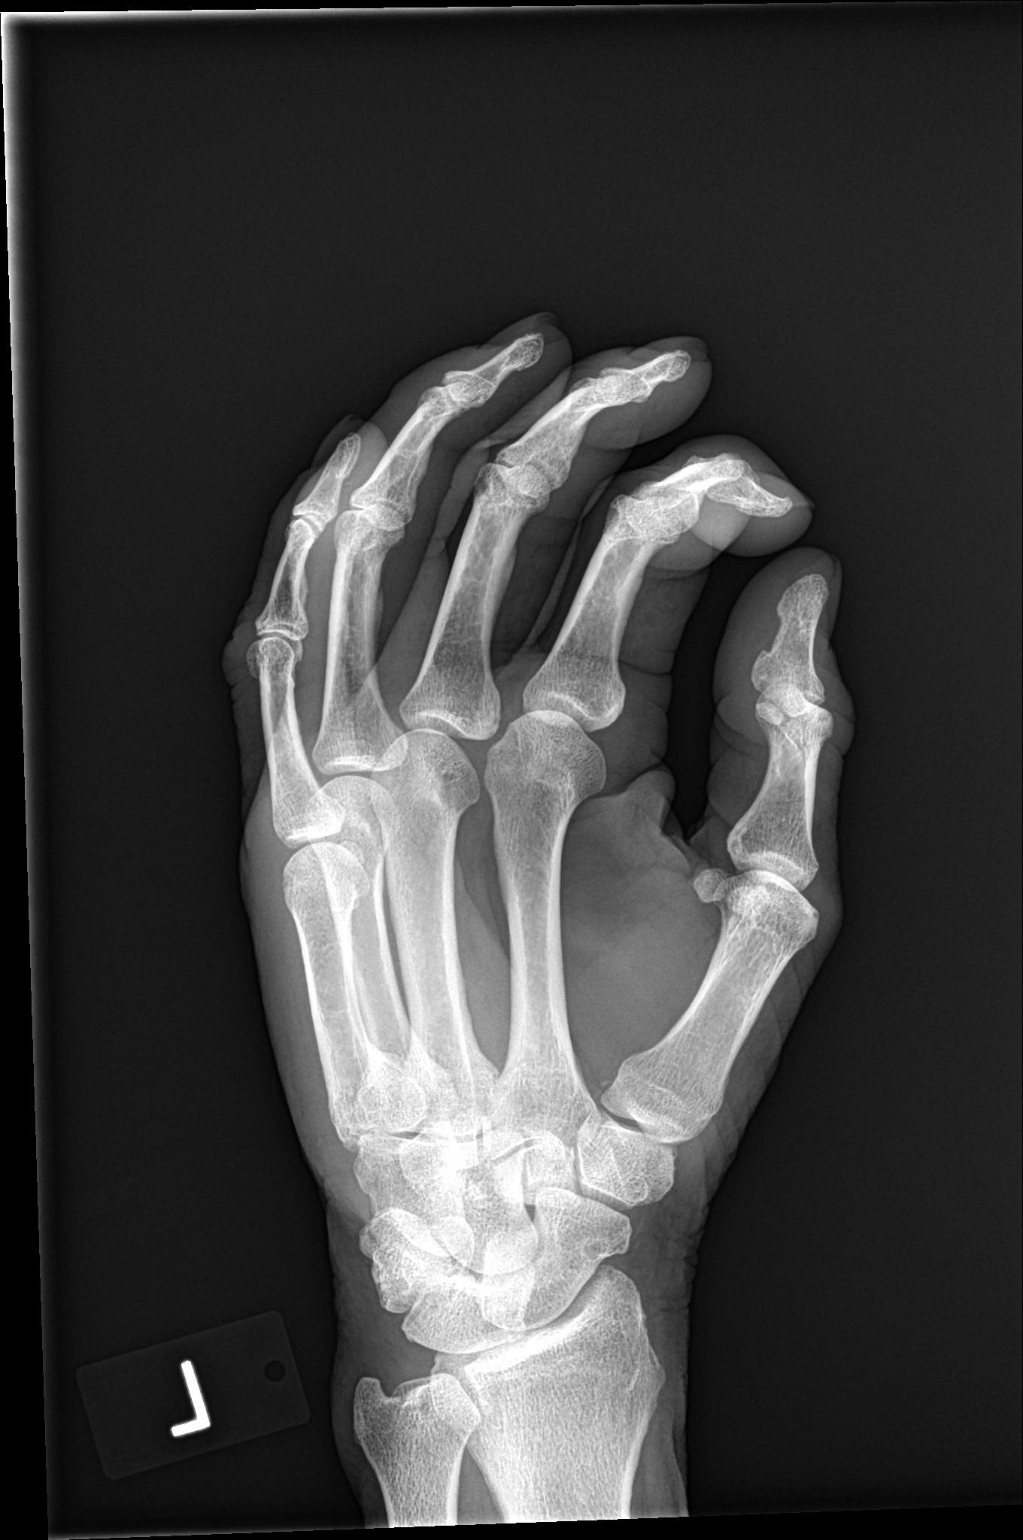

[hand lat]
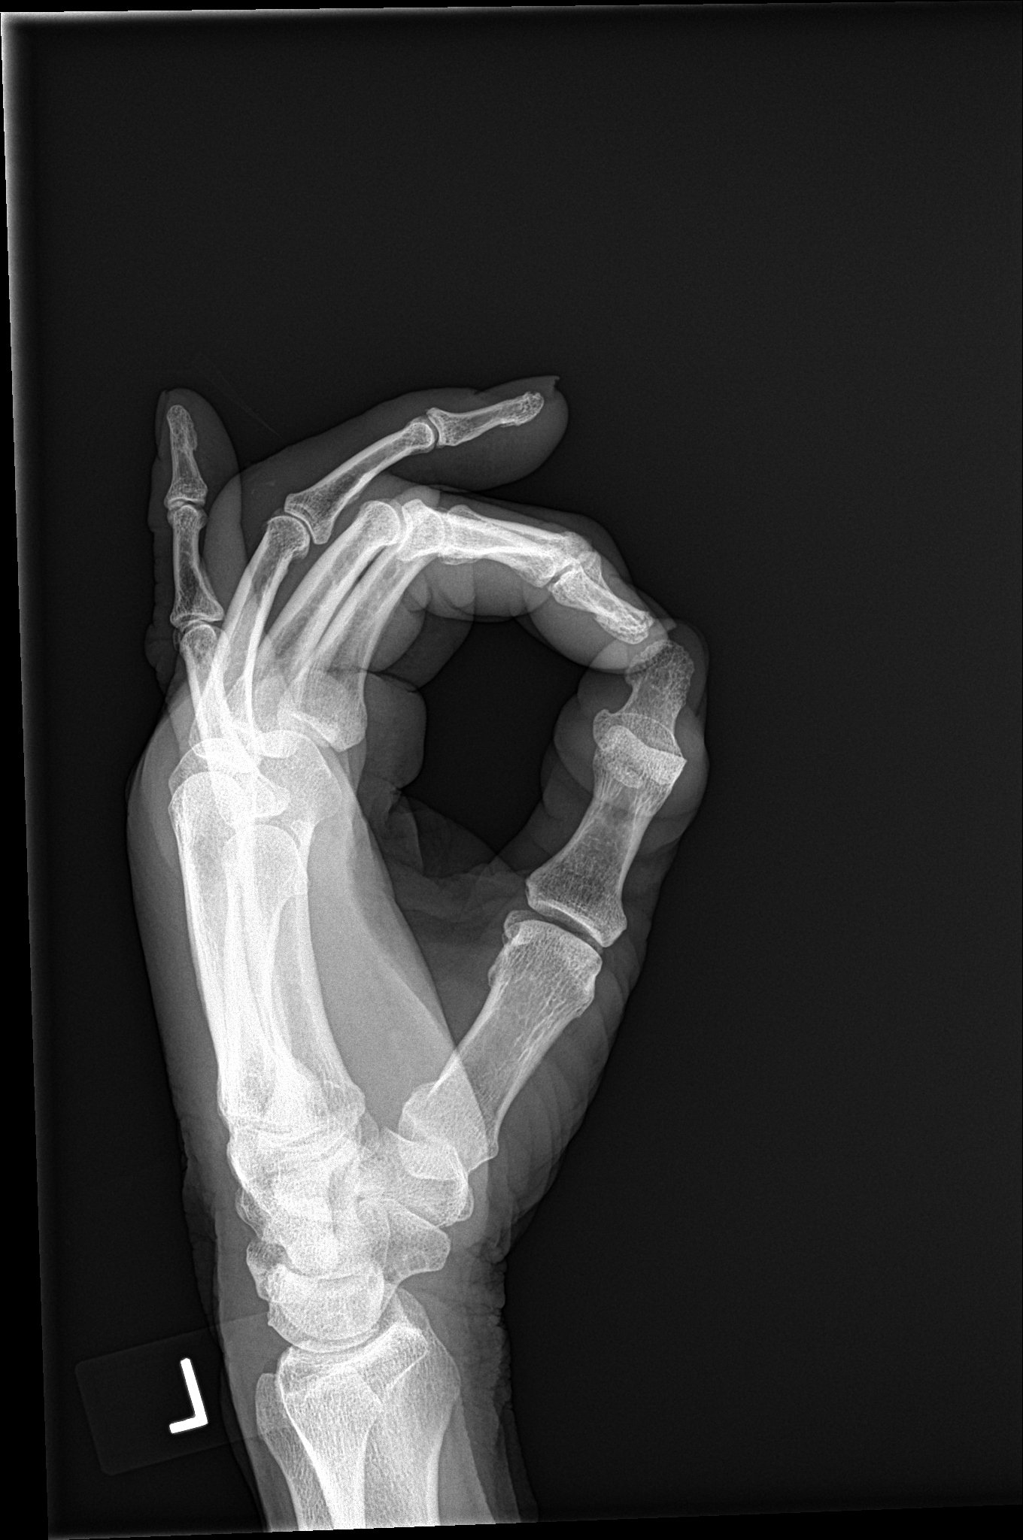

[3 of 3 positions shown; findings below may reference images not displayed]

FINDINGS: There is soft tissue swelling of the ring finger. There several
vague and radiodense foreign bodies in the dorsal soft tissues at
the PIP joint consistent with foreign bodies.

No fracture or other significant bone abnormality.
IMPRESSION: Multiple small foreign bodies in the soft tissues of the dorsum of
the left ring finger as described.

## 2016-09-07 MED ORDER — CEFTRIAXONE SODIUM 1 G IJ SOLR
1.0000 g | Freq: Once | INTRAMUSCULAR | Status: AC
  Administered 2016-09-07: 1 g via INTRAMUSCULAR

## 2016-09-07 MED ORDER — CIPROFLOXACIN HCL 500 MG PO TABS: 500.0000 mg | ORAL_TABLET | Freq: Two times a day (BID) | ORAL | 0 refills | Status: DC

## 2016-09-07 NOTE — Progress Notes (Signed)
Subjective:  [name], male    DOB: 1951/02/18  Age: 65 y.o. MRN: [id number]  CC: Hand Pain (pt here today c/o left hand swollen, he had broken a light fixture and cut his left ring finger and arm 2 weeks ago but it started swelling yesterday and has purulent drainage as well as swelling in the hand.)   HPI Omar Todd presents for Symptoms noted above. The hand is difficult to flex at the fourth PIP. He is concerned there may be a splinter from the broken glass still embedded. He broke a fluorescent tube. He is left-handed and it makes it difficult for him to write, sign his name or performed other fine motor activities.   History Newman has a past medical history of COPD (chronic obstructive pulmonary disease) ([place]); Diabetes mellitus without complication ([place]); GERD (gastroesophageal reflux disease); Hyperlipidemia; and Hypertension.   He has a past surgical history that includes Thumb surgery.   His family history includes Cancer in his father; Diabetes in his sister; Healthy in his sister and sister; Stroke in his brother.He reports that he has quit smoking. His smoking use included Cigarettes. He has a 30.00 pack-year smoking history. He has never used smokeless tobacco. He reports that he does not drink alcohol or use drugs.    ROS Review of Systems  Constitutional: Negative for chills, diaphoresis and fever.  HENT: Negative for rhinorrhea and sore throat.   Respiratory: Negative for cough and shortness of breath.   Cardiovascular: Negative for chest pain.  Musculoskeletal: Positive for arthralgias and joint swelling. Negative for myalgias.  Skin: Negative for rash.  Neurological: Negative for headaches.    Objective:  BP (!) 154/71   Pulse 81   Temp 98.5 F (36.9 C) (Oral)   Ht 6\' 2"  (1.88 m)   Wt 188 lb (85.3 kg)   BMI 24.14 kg/m   BP Readings from Last 3 Encounters:  09/07/16 (!) 178/80  09/07/16 (!) 154/71  07/20/16 138/77    Wt Readings  from Last 3 Encounters:  09/07/16 189 lb (85.7 kg)  09/07/16 188 lb (85.3 kg)  07/20/16 180 lb (81.6 kg)     Physical Exam  Constitutional: He appears well-developed and well-nourished.  HENT:  Head: Normocephalic and atraumatic.  Right Ear: Tympanic membrane and external ear normal. No decreased hearing is noted.  Left Ear: Tympanic membrane and external ear normal. No decreased hearing is noted.  Mouth/Throat: No oropharyngeal exudate or posterior oropharyngeal erythema.  Eyes: Pupils are equal, round, and reactive to light.  Neck: Normal range of motion. Neck supple.  Cardiovascular: Normal rate and regular rhythm.   No murmur heard. Pulmonary/Chest: Breath sounds normal. No respiratory distress.  Musculoskeletal:  2+ edema at the left fourth PIP dorsally. There is a 6 mm crust with a 2 mm ring of erythema surrounding. There is painful and only minimal flexion. He has full extension.  Vitals reviewed.    Lab Results  Component Value Date   WBC 9.9 07/16/2016   HGB 12.9 (A) 10/31/2014   HCT 40.0 07/16/2016   PLT 329 07/16/2016   GLUCOSE 165 (H) 07/16/2016   CHOL 154 07/16/2016   TRIG 63 07/16/2016   HDL 61 07/16/2016   LDLCALC 80 07/16/2016   ALT 7 07/16/2016   AST 9 07/16/2016   NA 142 07/16/2016   K 4.2 07/16/2016   CL 102 07/16/2016   CREATININE 0.83 07/16/2016   BUN 16 07/16/2016   CO2 26 07/16/2016  PSA 0.7 10/31/2014   HGBA1C 7.4 07/28/2015   MICROALBUR negative 07/28/2015    No results found.  Assessment & Plan:   Kacey was seen today for hand pain.  Diagnoses and all orders for this visit:  Abscess of finger of left hand -     DG Hand Complete Left; Future -     cefTRIAXone (ROCEPHIN) injection 1 g; Inject 1 g into the muscle once.  Foreign body of left ring finger  Other orders -     ciprofloxacin (CIPRO) 500 MG tablet; Take 1 tablet (500 mg total) by mouth 2 (two) times daily.    X-ray shows 1 mm foreign body 2-3. Embedded too deeply  for removal without deep expiration.   I am having Mr. Ebbs start on ciprofloxacin. I am also having him maintain his BIOTIN 5000 PO, cholecalciferol, tadalafil, montelukast, lisinopril, accu-chek soft touch, glucose blood, meloxicam, ranitidine, and metFORMIN. We will continue to administer cefTRIAXone.  Meds ordered this encounter  Medications  . cefTRIAXone (ROCEPHIN) injection 1 g  . ciprofloxacin (CIPRO) 500 MG tablet    Sig: Take 1 tablet (500 mg total) by mouth 2 (two) times daily.    Dispense:  30 tablet    Refill:  0     Follow-up: Return in about 2 days (around 09/09/2016) for infection.  Claretta Fraise, M.D.

## 2016-09-07 NOTE — Patient Instructions (Signed)
Medication Instructions:  The current medical regimen is effective;  continue present plan and medications.  Testing/Procedures: Your physician has requested that you have a carotid duplex. This test is an ultrasound of the carotid arteries in your neck. It looks at blood flow through these arteries that supply the brain with blood. Allow one hour for this exam. There are no restrictions or special instructions.  Follow-Up: Follow up in 1 year with Dr. Hochrein.  You will receive a letter in the mail 2 months before you are due.  Please call us when you receive this letter to schedule your follow up appointment.  If you need a refill on your cardiac medications before your next appointment, please call your pharmacy.  Thank you for choosing [facility]!!     

## 2016-09-14 ENCOUNTER — Other Ambulatory Visit: Payer: Self-pay | Admitting: Family Medicine

## 2016-09-14 ENCOUNTER — Ambulatory Visit (INDEPENDENT_AMBULATORY_CARE_PROVIDER_SITE_OTHER): Payer: Medicare Other

## 2016-09-14 DIAGNOSIS — Z23 Encounter for immunization: Secondary | ICD-10-CM | POA: Diagnosis not present

## 2016-09-27 ENCOUNTER — Ambulatory Visit (HOSPITAL_COMMUNITY)
Admission: RE | Admit: 2016-09-27 | Discharge: 2016-09-27 | Disposition: A | Discharge status: Home / Self Care | Payer: Medicare Other | Source: Ambulatory Visit | Attending: Cardiology | Admitting: Cardiology

## 2016-09-27 DIAGNOSIS — I6523 Occlusion and stenosis of bilateral carotid arteries: Secondary | ICD-10-CM | POA: Diagnosis not present

## 2016-09-27 DIAGNOSIS — R0989 Other specified symptoms and signs involving the circulatory and respiratory systems: Secondary | ICD-10-CM | POA: Diagnosis present

## 2016-10-14 ENCOUNTER — Other Ambulatory Visit: Payer: Self-pay | Admitting: Family Medicine

## 2016-11-07 DIAGNOSIS — S61411A Laceration without foreign body of right hand, initial encounter: Secondary | ICD-10-CM | POA: Diagnosis not present

## 2016-11-09 ENCOUNTER — Ambulatory Visit (INDEPENDENT_AMBULATORY_CARE_PROVIDER_SITE_OTHER): Payer: Medicare Other | Admitting: Family Medicine

## 2016-11-09 ENCOUNTER — Encounter: Payer: Self-pay | Admitting: Family Medicine

## 2016-11-09 VITALS — BP 135/65 | HR 87 | Temp 99.1°F | Ht 74.0 in | Wt 193.0 lb

## 2016-11-09 DIAGNOSIS — S61011D Laceration without foreign body of right thumb without damage to nail, subsequent encounter: Secondary | ICD-10-CM

## 2016-11-09 DIAGNOSIS — I10 Essential (primary) hypertension: Secondary | ICD-10-CM

## 2016-11-09 DIAGNOSIS — E559 Vitamin D deficiency, unspecified: Secondary | ICD-10-CM

## 2016-11-09 DIAGNOSIS — N400 Enlarged prostate without lower urinary tract symptoms: Secondary | ICD-10-CM

## 2016-11-09 DIAGNOSIS — S61011A Laceration without foreign body of right thumb without damage to nail, initial encounter: Secondary | ICD-10-CM

## 2016-11-09 DIAGNOSIS — E11610 Type 2 diabetes mellitus with diabetic neuropathic arthropathy: Secondary | ICD-10-CM | POA: Diagnosis not present

## 2016-11-09 DIAGNOSIS — H6123 Impacted cerumen, bilateral: Secondary | ICD-10-CM | POA: Diagnosis not present

## 2016-11-09 NOTE — Patient Instructions (Addendum)
Medicare Annual Wellness Visit  [facility] and the medical providers at [facility] strive to bring you the best medical care.  In doing so we not only want to address your current medical conditions and concerns but also to detect new conditions early and prevent illness, disease and health-related problems.    Medicare offers a yearly Wellness Visit which allows our clinical staff to assess your need for preventative services including immunizations, lifestyle education, counseling to decrease risk of preventable diseases and screening for fall risk and other medical concerns.    This visit is provided free of charge (no copay) for all Medicare recipients. The clinical pharmacists at [facility] have begun to conduct these Wellness Visits which will also include a thorough review of all your medications.    As you primary medical provider recommend that you make an appointment for your Annual Wellness Visit if you have not done so already this year.  You may set up this appointment before you leave today or you may call back [phone]) and schedule an appointment.  Please make sure when you call that you mention that you are scheduling your Annual Wellness Visit with the clinical pharmacist so that the appointment may be made for the proper length of time.     Continue current medications. Continue good therapeutic lifestyle changes which include good diet and exercise. Fall precautions discussed with patient. If an FOBT was given today- please return it to our front desk. If you are over 100 years old - you may need Prevnar 42 or the adult Pneumonia vaccine.  **Flu shots are available--- please call and schedule a FLU-CLINIC appointment**  After your visit with [place] today you will receive a survey in the mail or online from [employer] regarding your care with [place]. Please take a moment to fill this out. Your feedback is very  important to [place] as you can help [place] better understand your patient needs as well as improve your experience and satisfaction. WE CARE ABOUT YOU!!!   The patient should return to the office in about 10 days to have the sutures removed from the laceration of his right hand. He should keep the area clean and dry. He should continue to check his blood sugars regularly and his feet regularly He should finish any antibiotics that he is started for the laceration of his thumb.

## 2016-11-09 NOTE — Progress Notes (Signed)
Subjective:    [name], male    DOB: Aug 07, 1951, 65 y.o.   MRN: [id number]  HPI  Pt here for follow up and management of chronic medical problems which includes diabetes and hypertension. He is taking medication regularly. He is taking medication regularly.The patient recently had stitches inserted in his right hand and this is on December 18. He will get lab work when he comes back fasting. His vital signs are stable. The patient has never had a screening colonoscopy. He has had a fecal occult blood test done and this was in March of this year.The patient denies any chest pain or shortness of breath. He has no trouble with nausea vomiting diarrhea blood in the stool or black tarry bowel movements. He is passing his water without problems. We will change the dressing on his wound and clean it and redress it for him. He will come back in about 10 days to have the sutures removed.    Patient Active Problem List   Diagnosis Date Noted  . BPH (benign prostatic hyperplasia) 10/31/2014  . Systolic ejection murmur 0000000  . Bilateral carotid bruits 10/31/2014  . Erectile dysfunction 11/05/2013  . Bilateral carotid artery disease ([place]) 04/11/2013  . hyperlipidemia 04/11/2013  . HTN (hypertension) 04/11/2013  . COPD exacerbation ([place]) 04/11/2013  . Diabetes mellitus type 2 controlled 04/11/2013  . Diabetic neuropathy, type II diabetes mellitus ([place]) 03/15/2013   Outpatient Encounter Prescriptions as of 11/09/2016  Medication Sig  . amLODipine (NORVASC) 10 MG tablet Take 10 mg by mouth daily.  Marland Kitchen aspirin EC 81 MG tablet Take 81 mg by mouth daily.  Marland Kitchen BIOTIN 5000 PO Take 1 capsule by mouth daily.  . cephALEXin (KEFLEX) 500 MG capsule Take 1 capsule by mouth 2 (two) times daily. X 10 days  . cholecalciferol (VITAMIN D) 1000 UNITS tablet Take 1,000 Units by mouth daily.  Marland Kitchen escitalopram (LEXAPRO) 10 MG tablet TAKE ONE (1) TABLET EACH DAY  . glucose blood (ACCU-CHEK AVIVA PLUS)  test strip Test bid. Dx E11.9  . JANUVIA 100 MG tablet TAKE ONE (1) TABLET EACH DAY  . Lancets (ACCU-CHEK SOFT TOUCH) lancets Test blood sugar twice daily DX E11.9  . lisinopril (PRINIVIL,ZESTRIL) 10 MG tablet Take 1 tablet (10 mg total) by mouth 2 (two) times daily.  . meloxicam (MOBIC) 7.5 MG tablet TAKE ONE TABLET BY MOUTH TWICE DAILY  . metFORMIN (GLUCOPHAGE) 1000 MG tablet TAKE ONE TABLET TWICE A DAY WITH FOOD  . montelukast (SINGULAIR) 10 MG tablet TAKE ONE TABLET DAILY AT BEDTIME  . pravastatin (PRAVACHOL) 40 MG tablet Take 40 mg by mouth daily.  . ranitidine (ZANTAC) 150 MG tablet TAKE ONE TABLET BY MOUTH TWICE DAILY  . tadalafil (CIALIS) 5 MG tablet Take 5 mg by mouth daily as needed for erectile dysfunction.  . [DISCONTINUED] ciprofloxacin (CIPRO) 500 MG tablet Take 1 tablet (500 mg total) by mouth 2 (two) times daily.  . [DISCONTINUED] escitalopram (LEXAPRO) 10 MG tablet Take 10 mg by mouth daily.  . [DISCONTINUED] montelukast (SINGULAIR) 10 MG tablet Take 10 mg by mouth at bedtime.  . [DISCONTINUED] [name] (JANUVIA) 100 MG tablet Take 100 mg by mouth daily.   No facility-administered encounter medications on file as of 11/09/2016.       Review of Systems  Constitutional: Negative.   HENT: Negative.   Eyes: Negative.   Respiratory: Negative.   Cardiovascular: Negative.   Gastrointestinal: Negative.   Endocrine: Negative.   Genitourinary: Negative.  Musculoskeletal: Negative.   Skin: Positive for wound (right thumb injury - has stitches).  Allergic/Immunologic: Negative.   Neurological: Negative.   Hematological: Negative.   Psychiatric/Behavioral: Negative.        Objective:   Physical Exam  Constitutional: He is oriented to person, place, and time. He appears well-developed and well-nourished.  HENT:  Head: Normocephalic and atraumatic.  Nose: Nose normal.  Mouth/Throat: No oropharyngeal exudate.  Bilateral ear cerumen which was irrigated later in the  visit to remove this.  Eyes: Conjunctivae and EOM are normal. Pupils are equal, round, and reactive to light. Right eye exhibits no discharge. Left eye exhibits no discharge. No scleral icterus.  Neck: Normal range of motion. Neck supple. No thyromegaly present.  No  thyromegaly or anterior cervical adenopathy, there are bilateral carotid bruits.  Cardiovascular: Normal rate, regular rhythm and intact distal pulses.   Murmur heard. Heart has a grade 3/6 systolic ejection murmur. He is recently seen the cardiologist and had this evaluated further.  Pulmonary/Chest: Effort normal and breath sounds normal. No respiratory distress. He has no wheezes. He has no rales.  The chest is clear anteriorly and posteriorly and there is no axillary adenopathy  Abdominal: Soft. Bowel sounds are normal. He exhibits no mass. There is no tenderness. There is no rebound and no guarding.  No abdominal tenderness masses or organ enlargement or bruits  Musculoskeletal: Normal range of motion. He exhibits no edema.  Lymphadenopathy:    He has no cervical adenopathy.  Neurological: He is alert and oriented to person, place, and time. He has normal reflexes. No cranial nerve deficit.  Skin: Skin is warm and dry. No rash noted.  Psychiatric: He has a normal mood and affect. His behavior is normal. Judgment and thought content normal.  Nursing note and vitals reviewed.   BP 135/65 (BP Location: Left Arm)   Pulse 87   Temp 99.1 F (37.3 C) (Oral)   Ht 6\' 2"  (1.88 m)   Wt 193 lb (87.5 kg)   BMI 24.78 kg/m        Assessment & Plan:  1. Essential hypertension -The blood pressure is good today the patient will continue with current treatment  2. Type 2 diabetes mellitus with diabetic neuropathic arthropathy, without long-term -The patient will continue with current treatment pending results of lab work  3. Vitamin D deficiency -Continue with current treatment pending results of lab work  4. Benign prostatic  hyperplasia, unspecified whether lower urinary tract symptoms present -The patient is doing well with this with no symptoms.  5. Excessive cerumen in ear canal, bilateral -Bilateral ear irrigation without complication.  6. Laceration of right thumb without foreign body without damage to nail, subsequent encounter -Return to clinic in about 10 days for suture removal.  Patient Instructions                       Medicare Annual Wellness Visit  [facility] and the medical providers at [facility] strive to bring you the best medical care.  In doing so we not only want to address your current medical conditions and concerns but also to detect new conditions early and prevent illness, disease and health-related problems.    Medicare offers a yearly Wellness Visit which allows our clinical staff to assess your need for preventative services including immunizations, lifestyle education, counseling to decrease risk of preventable diseases and screening for fall risk and other medical concerns.    This  visit is provided free of charge (no copay) for all Medicare recipients. The clinical pharmacists at [facility] have begun to conduct these Wellness Visits which will also include a thorough review of all your medications.    As you primary medical provider recommend that you make an appointment for your Annual Wellness Visit if you have not done so already this year.  You may set up this appointment before you leave today or you may call back [phone] and schedule an appointment.  Please make sure when you call that you mention that you are scheduling your Annual Wellness Visit with the clinical pharmacist so that the appointment may be made for the proper length of time.     Continue current medications. Continue good therapeutic lifestyle changes which include good diet and exercise. Fall precautions discussed with patient. If an FOBT was given today-  please return it to our front desk. If you are over 7 years old - you may need Prevnar 54 or the adult Pneumonia vaccine.  **Flu shots are available--- please call and schedule a FLU-CLINIC appointment**  After your visit with [place] today you will receive a survey in the mail or online from [employer] regarding your care with [place]. Please take a moment to fill this out. Your feedback is very important to [place] as you can help [place] better understand your patient needs as well as improve your experience and satisfaction. WE CARE ABOUT YOU!!!   The patient should return to the office in about 10 days to have the sutures removed from the laceration of his right hand. He should keep the area clean and dry. He should continue to check his blood sugars regularly and his feet regularly He should finish any antibiotics that he is started for the laceration of his thumb.    Arrie Senate MD

## 2016-11-10 ENCOUNTER — Other Ambulatory Visit: Payer: Medicare Other

## 2016-11-10 DIAGNOSIS — E11610 Type 2 diabetes mellitus with diabetic neuropathic arthropathy: Secondary | ICD-10-CM

## 2016-11-10 DIAGNOSIS — E559 Vitamin D deficiency, unspecified: Secondary | ICD-10-CM | POA: Diagnosis not present

## 2016-11-10 DIAGNOSIS — I10 Essential (primary) hypertension: Secondary | ICD-10-CM

## 2016-11-10 DIAGNOSIS — N400 Enlarged prostate without lower urinary tract symptoms: Secondary | ICD-10-CM

## 2016-11-10 LAB — BAYER DCA HB A1C WAIVED: HB A1C: 7.5 % — AB (ref <7.0)

## 2016-11-11 LAB — CBC WITH DIFFERENTIAL/PLATELET
BASOS: 0 %
Basophils Absolute: 0 10*3/uL (ref 0.0–0.2)
EOS (ABSOLUTE): 0.2 10*3/uL (ref 0.0–0.4)
Eos: 2 %
HEMOGLOBIN: 12.9 g/dL — AB (ref 13.0–17.7)
Hematocrit: 39.2 % (ref 37.5–51.0)
IMMATURE GRANS (ABS): 0 10*3/uL (ref 0.0–0.1)
Immature Granulocytes: 0 %
LYMPHS ABS: 1.3 10*3/uL (ref 0.7–3.1)
LYMPHS: 13 %
MCH: 30.6 pg (ref 26.6–33.0)
MCHC: 32.9 g/dL (ref 31.5–35.7)
MCV: 93 fL (ref 79–97)
Monocytes Absolute: 0.6 10*3/uL (ref 0.1–0.9)
Monocytes: 6 %
Neutrophils Absolute: 7.7 10*3/uL — ABNORMAL HIGH (ref 1.4–7.0)
Neutrophils: 79 %
PLATELETS: 332 10*3/uL (ref 150–379)
RBC: 4.21 x10E6/uL (ref 4.14–5.80)
RDW: 14.3 % (ref 12.3–15.4)
WBC: 9.7 10*3/uL (ref 3.4–10.8)

## 2016-11-11 LAB — LIPID PANEL
Chol/HDL Ratio: 3 ratio units (ref 0.0–5.0)
Cholesterol, Total: 147 mg/dL (ref 100–199)
HDL: 49 mg/dL (ref >39)
LDL CALC: 84 mg/dL (ref 0–99)
Triglycerides: 70 mg/dL (ref 0–149)
VLDL CHOLESTEROL CAL: 14 mg/dL (ref 5–40)

## 2016-11-11 LAB — BMP8+EGFR
BUN / CREAT RATIO: 16 (ref 10–24)
BUN: 14 mg/dL (ref 8–27)
CALCIUM: 9.5 mg/dL (ref 8.6–10.2)
CO2: 27 mmol/L (ref 18–29)
Chloride: 101 mmol/L (ref 96–106)
Creatinine, Ser: 0.89 mg/dL (ref 0.76–1.27)
GFR, EST AFRICAN AMERICAN: 104 mL/min/{1.73_m2} (ref >59)
GFR, EST NON AFRICAN AMERICAN: 90 mL/min/{1.73_m2} (ref >59)
Glucose: 276 mg/dL — ABNORMAL HIGH (ref 65–99)
POTASSIUM: 5.1 mmol/L (ref 3.5–5.2)
Sodium: 143 mmol/L (ref 134–144)

## 2016-11-11 LAB — HEPATIC FUNCTION PANEL
ALBUMIN: 3.8 g/dL (ref 3.6–4.8)
ALT: 9 IU/L (ref 0–44)
AST: 12 IU/L (ref 0–40)
Alkaline Phosphatase: 75 IU/L (ref 39–117)
BILIRUBIN TOTAL: 0.3 mg/dL (ref 0.0–1.2)
Bilirubin, Direct: 0.09 mg/dL (ref 0.00–0.40)
TOTAL PROTEIN: 6 g/dL (ref 6.0–8.5)

## 2016-11-11 LAB — VITAMIN D 25 HYDROXY (VIT D DEFICIENCY, FRACTURES): Vit D, 25-Hydroxy: 53 ng/mL (ref 30.0–100.0)

## 2016-11-22 DIAGNOSIS — Z4802 Encounter for removal of sutures: Secondary | ICD-10-CM | POA: Diagnosis not present

## 2016-12-01 NOTE — Congregational Nurse Program (Signed)
Congregational Nurse Program Note  Date of Encounter: 11/30/2016  Past Medical History: Past Medical History:  Diagnosis Date  . COPD (chronic obstructive pulmonary disease) ([place])   . Diabetes mellitus without complication ([place])   . GERD (gastroesophageal reflux disease)   . Hyperlipidemia   . Hypertension     Encounter Details:     CNP Questionnaire - 11/24/16 1125      Patient Demographics   Is this a new or existing patient? Existing   Patient is considered a/an Not Applicable   Race Caucasian/White     Patient Assistance   Location of Patient Assistance [place]   Patient's financial/insurance status Medicaid;Medicare   Uninsured Patient ([place] [employer]) No   Patient referred to apply for the following financial assistance Not Applicable   Food insecurities addressed Provided food supplies   Transportation assistance No   Assistance securing medications No   Educational health offerings Nutrition;Safety;Health literacy     Encounter Details   Primary purpose of visit Education/Health Concerns;Safety   Was an Emergency Department visit averted? Not Applicable   Does patient have a medical provider? Yes   Patient referred to Follow up with established PCP   Was a mental health screening completed? (GAINS tool) No   Does patient have dental issues? No   Does patient have vision issues? No   Does your patient have an abnormal blood pressure today? Yes   Since previous encounter, have you referred patient for abnormal blood pressure that resulted in a new diagnosis or medication change? No   Does your patient have an abnormal blood glucose today? No   Since previous encounter, have you referred patient for abnormal blood glucose that resulted in a new diagnosis or medication change? No   Was there a life-saving intervention made? No    check (L thumb)cut on wood splitter,got Tetanus shot on 11/06/16.  Steri- strips in place, slight swelling,good  movement .BP 181/74  Pulse 76, Blood Sugar 111. Encouraged to take BP  meds as ordered. Marko Plume  [phone]

## 2016-12-18 ENCOUNTER — Other Ambulatory Visit: Payer: Self-pay | Admitting: Family Medicine

## 2017-01-14 DIAGNOSIS — S62511G Displaced fracture of proximal phalanx of right thumb, subsequent encounter for fracture with delayed healing: Secondary | ICD-10-CM | POA: Diagnosis not present

## 2017-01-14 DIAGNOSIS — M19041 Primary osteoarthritis, right hand: Secondary | ICD-10-CM | POA: Diagnosis not present

## 2017-01-14 DIAGNOSIS — S62511A Displaced fracture of proximal phalanx of right thumb, initial encounter for closed fracture: Secondary | ICD-10-CM | POA: Diagnosis not present

## 2017-01-14 DIAGNOSIS — M79641 Pain in right hand: Secondary | ICD-10-CM | POA: Diagnosis not present

## 2017-01-17 ENCOUNTER — Other Ambulatory Visit: Payer: Self-pay | Admitting: Family Medicine

## 2017-01-19 ENCOUNTER — Other Ambulatory Visit: Payer: Self-pay | Admitting: Family Medicine

## 2017-01-19 ENCOUNTER — Other Ambulatory Visit: Payer: Self-pay | Admitting: Cardiology

## 2017-01-20 NOTE — Telephone Encounter (Signed)
E sent to pharmacy 

## 2017-02-17 ENCOUNTER — Other Ambulatory Visit: Payer: Self-pay | Admitting: Family Medicine

## 2017-03-08 ENCOUNTER — Other Ambulatory Visit: Payer: Self-pay | Admitting: Family Medicine

## 2017-03-20 ENCOUNTER — Other Ambulatory Visit: Payer: Self-pay | Admitting: Family Medicine

## 2017-03-21 NOTE — Telephone Encounter (Signed)
Last refill without being seen 

## 2017-03-22 ENCOUNTER — Ambulatory Visit: Payer: Medicare Other | Admitting: Family Medicine

## 2017-03-22 NOTE — Telephone Encounter (Signed)
Pt aware [facility] says has appt in June

## 2017-04-21 ENCOUNTER — Ambulatory Visit (INDEPENDENT_AMBULATORY_CARE_PROVIDER_SITE_OTHER): Payer: Medicare Other

## 2017-04-21 ENCOUNTER — Encounter: Payer: Self-pay | Admitting: Family Medicine

## 2017-04-21 ENCOUNTER — Ambulatory Visit (INDEPENDENT_AMBULATORY_CARE_PROVIDER_SITE_OTHER): Payer: Medicare Other | Admitting: Family Medicine

## 2017-04-21 VITALS — BP 184/78 | HR 73 | Temp 97.4°F | Ht 74.0 in | Wt 193.0 lb

## 2017-04-21 DIAGNOSIS — E11610 Type 2 diabetes mellitus with diabetic neuropathic arthropathy: Secondary | ICD-10-CM

## 2017-04-21 DIAGNOSIS — E1140 Type 2 diabetes mellitus with diabetic neuropathy, unspecified: Secondary | ICD-10-CM

## 2017-04-21 DIAGNOSIS — M79645 Pain in left finger(s): Secondary | ICD-10-CM

## 2017-04-21 DIAGNOSIS — N400 Enlarged prostate without lower urinary tract symptoms: Secondary | ICD-10-CM

## 2017-04-21 DIAGNOSIS — I10 Essential (primary) hypertension: Secondary | ICD-10-CM

## 2017-04-21 DIAGNOSIS — R030 Elevated blood-pressure reading, without diagnosis of hypertension: Secondary | ICD-10-CM

## 2017-04-21 DIAGNOSIS — E559 Vitamin D deficiency, unspecified: Secondary | ICD-10-CM | POA: Diagnosis not present

## 2017-04-21 DIAGNOSIS — Z0000 Encounter for general adult medical examination without abnormal findings: Secondary | ICD-10-CM

## 2017-04-21 LAB — URINALYSIS, COMPLETE
BILIRUBIN UA: NEGATIVE
GLUCOSE, UA: NEGATIVE
KETONES UA: NEGATIVE
Leukocytes, UA: NEGATIVE
NITRITE UA: NEGATIVE
RBC UA: NEGATIVE
SPEC GRAV UA: 1.025 (ref 1.005–1.030)
UUROB: 0.2 mg/dL (ref 0.2–1.0)
pH, UA: 6 (ref 5.0–7.5)

## 2017-04-21 LAB — MICROSCOPIC EXAMINATION
BACTERIA UA: NONE SEEN
RBC MICROSCOPIC, UA: NONE SEEN /HPF (ref 0–?)
Renal Epithel, UA: NONE SEEN /hpf

## 2017-04-21 LAB — BAYER DCA HB A1C WAIVED: HB A1C: 7.1 % — AB (ref <7.0)

## 2017-04-21 IMAGING — DX DG FINGER RING 2+V*L*
3 series · 3 of 3 positions shown · non-contrast
Comparison: [DATE]

CLINICAL DATA: Cut finger 1 month ago with pain and swelling

EXAM:
LEFT RING FINGER 2+V

[finger ap]
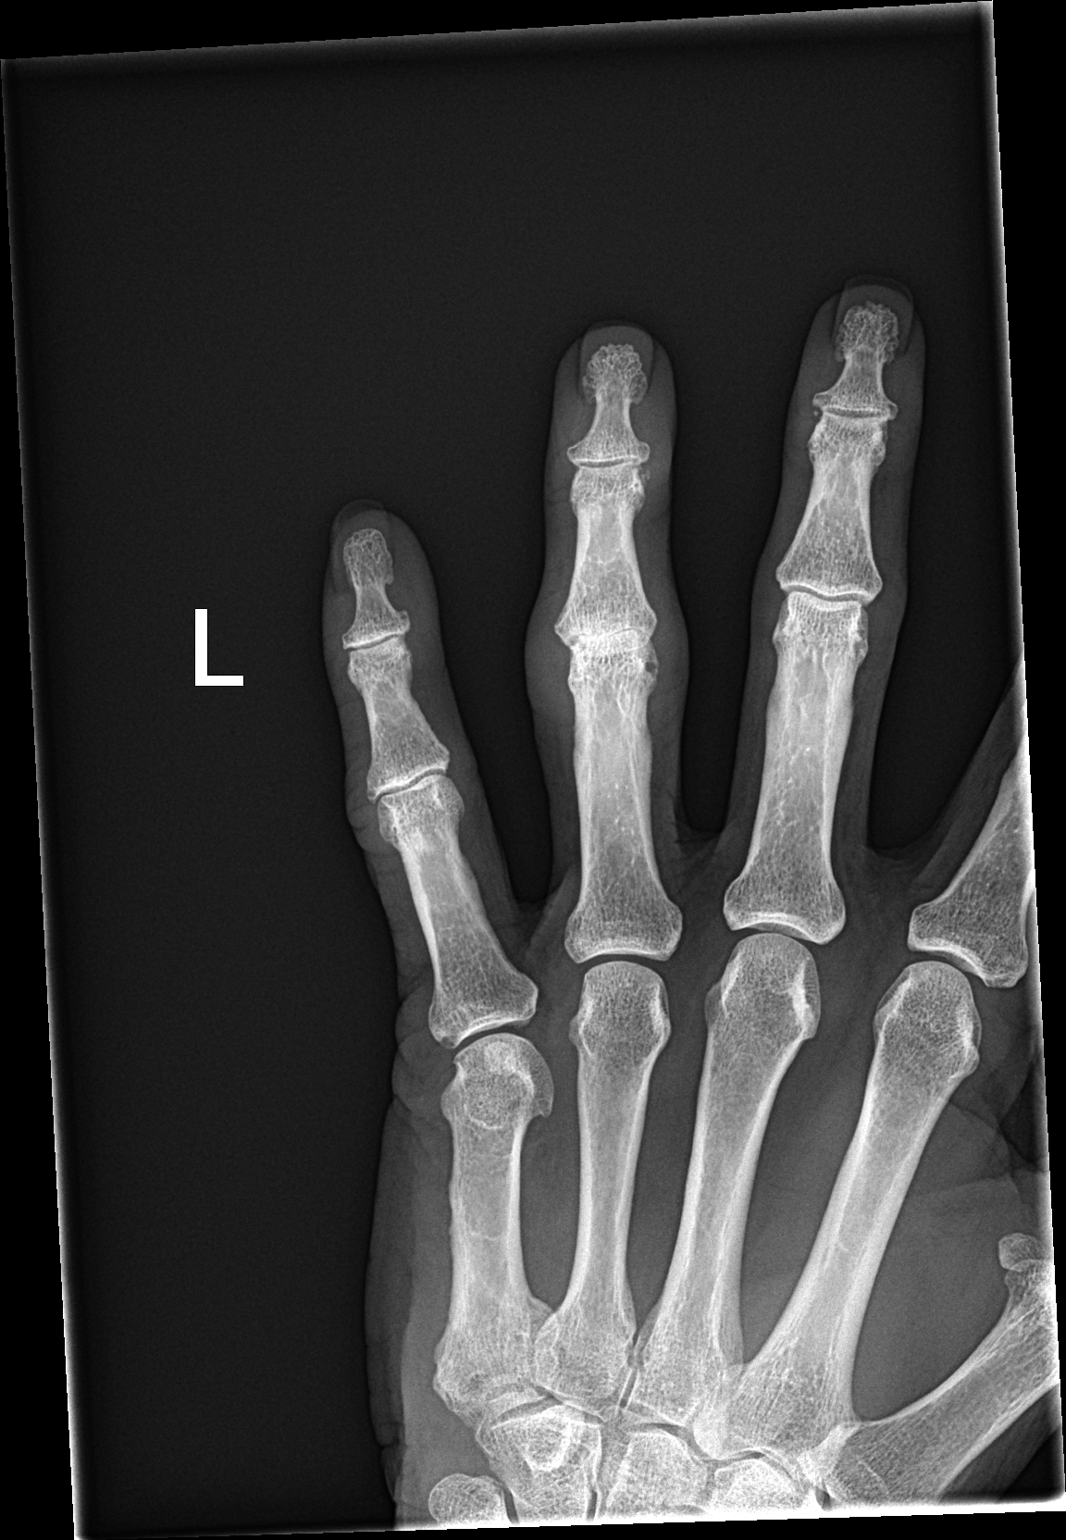

[finger obl]
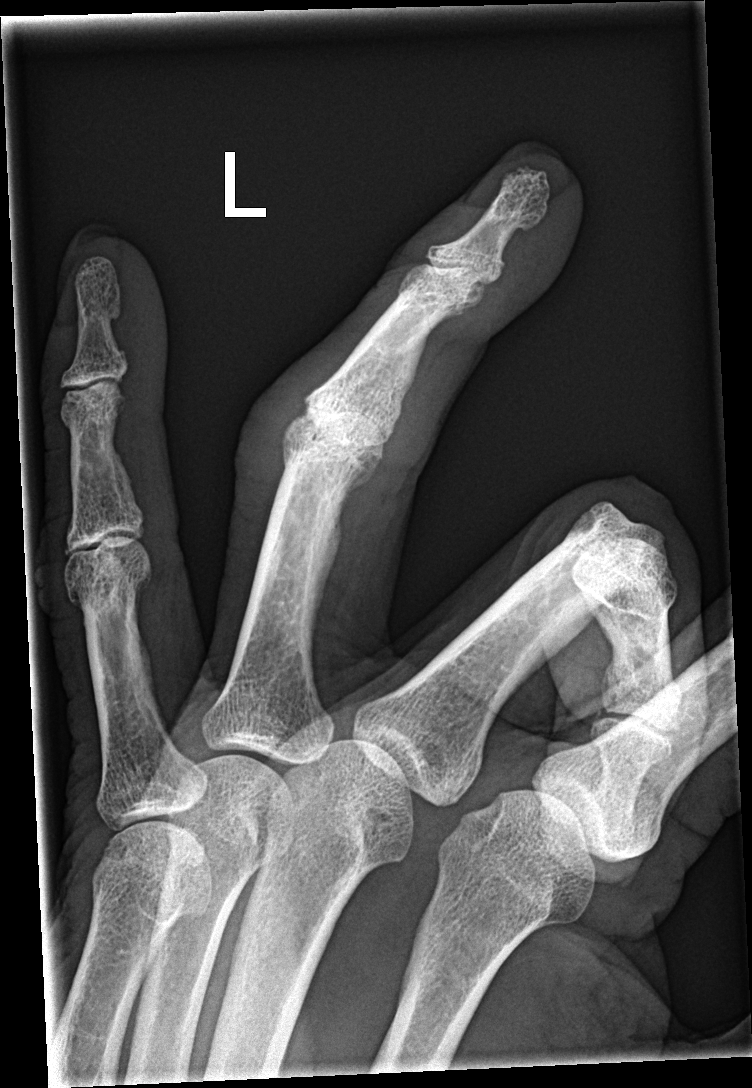

[finger lat]
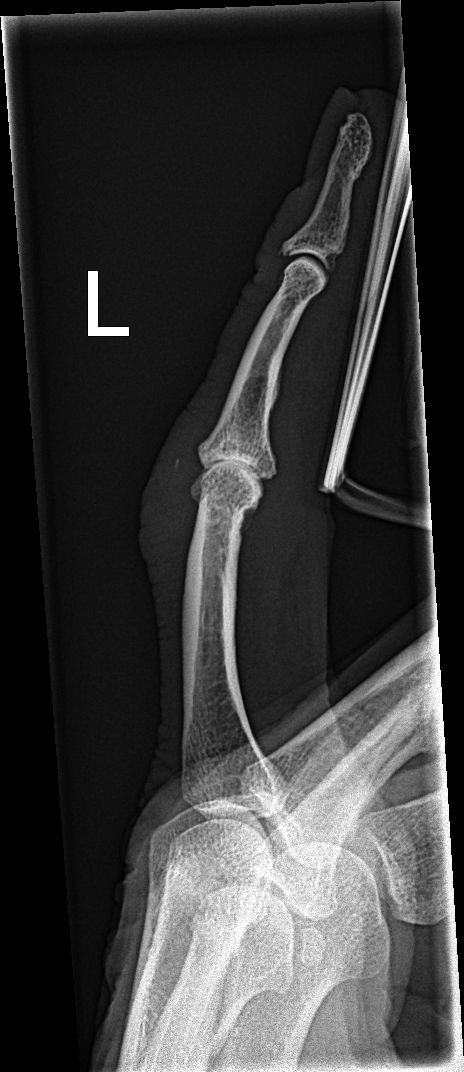

[3 of 3 positions shown; findings below may reference images not displayed]

FINDINGS: No acute fracture or malalignment. There is joint space narrowing at
the fourth PIP joint with surrounding soft tissue swelling. Possible
small erosive change at the joint margin on the oblique view. Linear
radiopacity dorsal to the PIP joint could reflect a small foreign
body.
IMPRESSION: 1. Mild joint space narrowing at the fourth PIP joint with
surrounding soft tissue swelling and possible small erosion along
the joint margin. If septic arthritis/ osteomyelitis is a concern,
further evaluation with MRI would be advised.
2. Possible 2 mm linear radiopaque foreign body in the soft tissues
dorsal to the fourth PIP joint.
These results will be called to the ordering clinician or
representative by the Radiologist Assistant, and communication
documented in the PACS or zVision Dashboard.

## 2017-04-21 NOTE — Patient Instructions (Addendum)
Medicare Annual Wellness Visit  [facility] and the medical providers at [facility] strive to bring you the best medical care.  In doing so we not only want to address your current medical conditions and concerns but also to detect new conditions early and prevent illness, disease and health-related problems.    Medicare offers a yearly Wellness Visit which allows our clinical staff to assess your need for preventative services including immunizations, lifestyle education, counseling to decrease risk of preventable diseases and screening for fall risk and other medical concerns.    This visit is provided free of charge (no copay) for all Medicare recipients. The clinical pharmacists at [facility] have begun to conduct these Wellness Visits which will also include a thorough review of all your medications.    As you primary medical provider recommend that you make an appointment for your Annual Wellness Visit if you have not done so already this year.  You may set up this appointment before you leave today or you may call back [phone] and schedule an appointment.  Please make sure when you call that you mention that you are scheduling your Annual Wellness Visit with the clinical pharmacist so that the appointment may be made for the proper len   Continue current medications. Continue good therapeutic lifestyle changes which include good diet and exercise. Fall precautions discussed with patient. If an FOBT was given today- please return it to our front desk. If you are over 23 years old - you may need Prevnar 70 or the adult Pneumonia vaccine.  **Flu shots are available--- please call and schedule a FLU-CLINIC appointment**  After your visit with [place] today you will receive a survey in the mail or online from [employer] regarding your care with [place]. Please take a moment to fill this out. Your feedback is very important to [place] as  you can help [place] better understand your patient needs as well as improve your experience and satisfaction. WE CARE ABOUT YOU!!!  Continue to take amlodipine 10 mg for blood pressure once daily as currently doing Increase lisinopril and make sure that you take 10 mg twice daily Come by the office in a couple weeks for the nurse to recheck the blood pressure  Bring all of your medicine with you to that visit Follow-up as aggressive therapeutic lifestyle changes as possible Watch sodium intake We will call with lab work and x-ray results as soon as they become available

## 2017-04-21 NOTE — Progress Notes (Signed)
Subjective:    [name], male    DOB: [birth year], 66 y.o.   MRN: [id number]  HPI Pt here for annual CPE and follow up and management of chronic medical problems which includes diabetes. He is taking medication regularly.The patient today is complaining with pain in his left fourth finger. He is recently got married. He is doing well otherwise. His blood pressure however is elevated today. We will make sure that he's been taking his medicine regularly and if he has we will add an additional medicine to his blood pressure regimen to get it under better control. The patient denies any chest pain or shortness of breath. He denies any trouble with swallowing heartburn indigestion nausea vomiting diarrhea or blood in the stool. He is taking his blood pressure medicine regularly. He is supposed to be taking lisinopril twice daily but may only be taking it once a day. He says he is not eating a lot of salt. He has no trouble passing his water. He did cut his ring finger on the left hand over the PIP joint and he is not been able to fully extend that since he had the cut. We will get an x-ray of this before he leaves today. He is also to you to get lab work and return an FOBT and get a rectal exam. He had an eye exam last month and said that he had them to send [place] a copy of that report.    Patient Active Problem List   Diagnosis Date Noted  . BPH (benign prostatic hyperplasia) 10/31/2014  . Systolic ejection murmur 34/91/7915  . Bilateral carotid bruits 10/31/2014  . Erectile dysfunction 11/05/2013  . Bilateral carotid artery disease ([place]) 04/11/2013  . hyperlipidemia 04/11/2013  . HTN (hypertension) 04/11/2013  . COPD exacerbation ([place]) 04/11/2013  . Diabetes mellitus type 2 controlled 04/11/2013  . Diabetic neuropathy, type II diabetes mellitus ([place]) 03/15/2013   Outpatient Encounter Prescriptions as of 04/21/2017  Medication Sig  . amLODipine (NORVASC) 10 MG tablet Take 10 mg by  mouth daily.  Marland Kitchen aspirin EC 81 MG tablet Take 81 mg by mouth daily.  Marland Kitchen BIOTIN 5000 PO Take 1 capsule by mouth daily.  . cholecalciferol (VITAMIN D) 1000 UNITS tablet Take 1,000 Units by mouth daily.  Marland Kitchen escitalopram (LEXAPRO) 10 MG tablet TAKE ONE (1) TABLET EACH DAY  . glucose blood (ACCU-CHEK AVIVA PLUS) test strip Test bid. Dx E11.9  . JANUVIA 100 MG tablet TAKE ONE (1) TABLET EACH DAY  . Lancets (ACCU-CHEK SOFT TOUCH) lancets Test blood sugar twice daily DX E11.9  . lisinopril (PRINIVIL,ZESTRIL) 10 MG tablet TAKE ONE TABLET BY MOUTH TWICE DAILY  . meloxicam (MOBIC) 7.5 MG tablet TAKE ONE TABLET BY MOUTH TWICE DAILY  . metFORMIN (GLUCOPHAGE) 1000 MG tablet TAKE ONE TABLET TWICE A DAY WITH FOOD  . montelukast (SINGULAIR) 10 MG tablet TAKE ONE TABLET DAILY AT BEDTIME  . pravastatin (PRAVACHOL) 40 MG tablet TAKE ONE (1) TABLET EACH DAY  . ranitidine (ZANTAC) 150 MG tablet TAKE ONE TABLET BY MOUTH TWICE DAILY  . tadalafil (CIALIS) 5 MG tablet Take 5 mg by mouth daily as needed for erectile dysfunction.  . [DISCONTINUED] cephALEXin (KEFLEX) 500 MG capsule Take 1 capsule by mouth 2 (two) times daily. X 10 days  . [DISCONTINUED] amLODipine (NORVASC) 10 MG tablet TAKE ONE (1) TABLET EACH DAY  . [DISCONTINUED] amLODipine (NORVASC) 10 MG tablet TAKE ONE (1) TABLET EACH DAY  . [DISCONTINUED] pravastatin (PRAVACHOL)  40 MG tablet Take 40 mg by mouth daily.  . [DISCONTINUED] pravastatin (PRAVACHOL) 40 MG tablet TAKE ONE (1) TABLET EACH DAY   No facility-administered encounter medications on file as of 04/21/2017.       Review of Systems  Constitutional: Negative.   HENT: Negative.   Eyes: Negative.   Respiratory: Negative.   Cardiovascular: Negative.   Gastrointestinal: Negative.   Endocrine: Negative.   Genitourinary: Negative.   Musculoskeletal: Negative.   Skin: Negative.        Left 4th finger - knuckle large and aches  Allergic/Immunologic: Negative.   Neurological: Negative.     Hematological: Negative.   Psychiatric/Behavioral: Negative.        Objective:   Physical Exam  Constitutional: He is oriented to person, place, and time. He appears well-developed and well-nourished. No distress.  The patient is pleasant and alert  HENT:  Head: Normocephalic and atraumatic.  Right Ear: External ear normal.  Left Ear: External ear normal.  Nose: Nose normal.  Mouth/Throat: Oropharynx is clear and moist. No oropharyngeal exudate.  Some ears cerumen bilaterally but both TMs were visible and normal  Eyes: Conjunctivae and EOM are normal. Pupils are equal, round, and reactive to light. Right eye exhibits no discharge. Left eye exhibits no discharge. No scleral icterus.  Neck: Normal range of motion. Neck supple. No thyromegaly present.  Bilateral carotid bruits  Cardiovascular: Normal rate, regular rhythm and intact distal pulses.   Murmur heard. Grade 3/6 systolic ejection murmur with regular rate and rhythm at 72/m. Patient has seen cardiologist in the past for this and says that he has another appointment for follow-up. This most likely reflects the murmur that is heard in his neck or the bruits in the neck.  Pulmonary/Chest: Effort normal and breath sounds normal. No respiratory distress. He has no wheezes. He has no rales. He exhibits no tenderness.  Clear anteriorly and posteriorly no axillary adenopathy  Abdominal: Soft. Bowel sounds are normal. He exhibits no mass. There is no tenderness. There is no rebound and no guarding.  No liver or spleen enlargement no tenderness no inguinal adenopathy and no bruits  Genitourinary: Rectum normal and penis normal.  Genitourinary Comments: The prostate is enlarged but soft and smooth. There were no rectal masses. The external genitalia were normal and no inguinal hernias were palpable and there were no inguinal nodes.  Musculoskeletal: Normal range of motion. He exhibits no edema.  The patient is unable to fully extend the  ring finger on the left side because of deformity in the PIP joint. There is no swelling or redness.  Lymphadenopathy:    He has no cervical adenopathy.  Neurological: He is alert and oriented to person, place, and time. He has normal reflexes. No cranial nerve deficit.  Skin: Skin is warm and dry. No rash noted.  Psychiatric: He has a normal mood and affect. His behavior is normal. Judgment and thought content normal.  Nursing note and vitals reviewed.  BP (!) 166/71   Pulse 73   Temp 97.4 F (36.3 C) (Oral)   Ht '6\' 2"'  (1.88 m)   Wt 193 lb (87.5 kg)   BMI 24.78 kg/m   Repeat blood pressure with large cuff was 184/78 in the right arm sitting.      Assessment & Plan:  1. Annual physical exam -The patient is doing well overall has no specific complaints other than the ring finger on the left hand. His blood pressure is elevated today we will  make sure that we follow-up on this and make sure that he is taking his lisinopril twice daily instead of once daily. - BMP8+EGFR - CBC with Differential/Platelet - Lipid panel - VITAMIN D 25 Hydroxy (Vit-D Deficiency, Fractures) - Hepatic function panel - Bayer DCA Hb A1c Waived - Urinalysis, Complete  2. Type 2 diabetes mellitus with diabetic neuropathic arthropathy, without long-term current use of insulin ([place]) -Continue with current treatment pending results of lab work - BMP8+EGFR - CBC with Differential/Platelet - Lipid panel - Hepatic function panel - Bayer DCA Hb A1c Waived  3. Vitamin D deficiency -Continue with current treatment pending results of lab work - CBC with Differential/Platelet  4. Benign prostatic hyperplasia, unspecified whether lower urinary tract symptoms present -The prostate is enlarged but soft and smooth. There are no symptoms of BPH with the patient. - CBC with Differential/Platelet - Urinalysis, Complete - PSA, total and free  5. Finger pain, left -The boutonniere deformity of the PIP joints  probably secondary to a tendon tear that happened several weeks ago. We'll get an x-ray to further evaluate this. - DG Finger Ring Left; Future  6. Essential hypertension -The blood pressure was elevated on 3 occasions today. The patient will make sure that he is taking his lisinopril at 20 mg daily instead of 10 mg daily and will return to the office in a couple weeks for recheck of blood pressure.  7. Type 2 diabetes mellitus with diabetic neuropathy, without long-term current use of insulin ([facility]) -Continue current medicines and aggressive therapeutic lifestyle changes  8. Elevated blood pressure reading -Take lisinopril 20 mg daily and continue to take amlodipine 10 mg daily and watch sodium intake  Patient Instructions                       Medicare Annual Wellness Visit  [facility] and the medical providers at [facility] strive to bring you the best medical care.  In doing so we not only want to address your current medical conditions and concerns but also to detect new conditions early and prevent illness, disease and health-related problems.    Medicare offers a yearly Wellness Visit which allows our clinical staff to assess your need for preventative services including immunizations, lifestyle education, counseling to decrease risk of preventable diseases and screening for fall risk and other medical concerns.    This visit is provided free of charge (no copay) for all Medicare recipients. The clinical pharmacists at [facility] have begun to conduct these Wellness Visits which will also include a thorough review of all your medications.    As you primary medical provider recommend that you make an appointment for your Annual Wellness Visit if you have not done so already this year.  You may set up this appointment before you leave today or you may call back ([phone]) and schedule an appointment.  Please make sure when you call that you  mention that you are scheduling your Annual Wellness Visit with the clinical pharmacist so that the appointment may be made for the proper len   Continue current medications. Continue good therapeutic lifestyle changes which include good diet and exercise. Fall precautions discussed with patient. If an FOBT was given today- please return it to our front desk. If you are over 49 years old - you may need Prevnar 69 or the adult Pneumonia vaccine.  **Flu shots are available--- please call and schedule a FLU-CLINIC appointment**  After  your visit with [place] today you will receive a survey in the mail or online from [employer] regarding your care with [place]. Please take a moment to fill this out. Your feedback is very important to [place] as you can help [place] better understand your patient needs as well as improve your experience and satisfaction. WE CARE ABOUT YOU!!!  Continue to take amlodipine 10 mg for blood pressure once daily as currently doing Increase lisinopril and make sure that you take 10 mg twice daily Come by the office in a couple weeks for the nurse to recheck the blood pressure  Bring all of your medicine with you to that visit Follow-up as aggressive therapeutic lifestyle changes as possible Watch sodium intake We will call with lab work and x-ray results as soon as they become available   Arrie Senate MD

## 2017-04-22 LAB — HEPATIC FUNCTION PANEL
ALK PHOS: 63 IU/L (ref 39–117)
ALT: 9 IU/L (ref 0–44)
AST: 13 IU/L (ref 0–40)
Albumin: 4.1 g/dL (ref 3.6–4.8)
Bilirubin Total: 0.2 mg/dL (ref 0.0–1.2)
Bilirubin, Direct: 0.08 mg/dL (ref 0.00–0.40)
TOTAL PROTEIN: 6.2 g/dL (ref 6.0–8.5)

## 2017-04-22 LAB — CBC WITH DIFFERENTIAL/PLATELET
BASOS ABS: 0 10*3/uL (ref 0.0–0.2)
Basos: 0 %
EOS (ABSOLUTE): 0.1 10*3/uL (ref 0.0–0.4)
EOS: 2 %
HEMOGLOBIN: 13.1 g/dL (ref 13.0–17.7)
Hematocrit: 40.2 % (ref 37.5–51.0)
Immature Grans (Abs): 0 10*3/uL (ref 0.0–0.1)
Immature Granulocytes: 0 %
Lymphocytes Absolute: 1.8 10*3/uL (ref 0.7–3.1)
Lymphs: 23 %
MCH: 29.8 pg (ref 26.6–33.0)
MCHC: 32.6 g/dL (ref 31.5–35.7)
MCV: 92 fL (ref 79–97)
MONOCYTES: 6 %
Monocytes Absolute: 0.4 10*3/uL (ref 0.1–0.9)
NEUTROS ABS: 5.2 10*3/uL (ref 1.4–7.0)
Neutrophils: 69 %
Platelets: 266 10*3/uL (ref 150–379)
RBC: 4.39 x10E6/uL (ref 4.14–5.80)
RDW: 14.8 % (ref 12.3–15.4)
WBC: 7.6 10*3/uL (ref 3.4–10.8)

## 2017-04-22 LAB — BMP8+EGFR
BUN / CREAT RATIO: 16 (ref 10–24)
BUN: 13 mg/dL (ref 8–27)
CALCIUM: 9.7 mg/dL (ref 8.6–10.2)
CHLORIDE: 103 mmol/L (ref 96–106)
CO2: 24 mmol/L (ref 18–29)
CREATININE: 0.82 mg/dL (ref 0.76–1.27)
GFR, EST AFRICAN AMERICAN: 107 mL/min/{1.73_m2} (ref >59)
GFR, EST NON AFRICAN AMERICAN: 92 mL/min/{1.73_m2} (ref >59)
Glucose: 195 mg/dL — ABNORMAL HIGH (ref 65–99)
Potassium: 4.1 mmol/L (ref 3.5–5.2)
Sodium: 143 mmol/L (ref 134–144)

## 2017-04-22 LAB — LIPID PANEL
CHOLESTEROL TOTAL: 169 mg/dL (ref 100–199)
Chol/HDL Ratio: 3.2 ratio (ref 0.0–5.0)
HDL: 53 mg/dL (ref >39)
LDL CALC: 92 mg/dL (ref 0–99)
TRIGLYCERIDES: 118 mg/dL (ref 0–149)
VLDL CHOLESTEROL CAL: 24 mg/dL (ref 5–40)

## 2017-04-22 LAB — PSA, TOTAL AND FREE
PROSTATE SPECIFIC AG, SERUM: 0.8 ng/mL (ref 0.0–4.0)
PSA FREE: 0.23 ng/mL
PSA, Free Pct: 28.8 %

## 2017-04-22 LAB — VITAMIN D 25 HYDROXY (VIT D DEFICIENCY, FRACTURES): Vit D, 25-Hydroxy: 47.6 ng/mL (ref 30.0–100.0)

## 2017-04-24 ENCOUNTER — Other Ambulatory Visit: Payer: Self-pay | Admitting: *Deleted

## 2017-04-24 DIAGNOSIS — M79645 Pain in left finger(s): Secondary | ICD-10-CM

## 2017-05-04 ENCOUNTER — Ambulatory Visit (INDEPENDENT_AMBULATORY_CARE_PROVIDER_SITE_OTHER): Payer: Medicare Other | Admitting: Orthopaedic Surgery

## 2017-05-16 ENCOUNTER — Other Ambulatory Visit: Payer: Self-pay | Admitting: Family Medicine

## 2017-05-18 ENCOUNTER — Encounter (INDEPENDENT_AMBULATORY_CARE_PROVIDER_SITE_OTHER): Payer: Self-pay | Admitting: Orthopaedic Surgery

## 2017-05-18 ENCOUNTER — Ambulatory Visit (INDEPENDENT_AMBULATORY_CARE_PROVIDER_SITE_OTHER): Payer: Medicare Other | Admitting: Orthopaedic Surgery

## 2017-05-18 VITALS — BP 120/78 | HR 75

## 2017-05-18 DIAGNOSIS — M19042 Primary osteoarthritis, left hand: Secondary | ICD-10-CM | POA: Diagnosis not present

## 2017-05-18 NOTE — Progress Notes (Signed)
Office Visit Note orthopedic consultation requested by Dr. Redge Gainer   Patient: Omar Todd           Date of Birth: 1951/11/06           MRN: [id number] Visit Date: 05/18/2017              Requested by: Chipper Herb, MD [address] [place], [place] [address] PCP: Chipper Herb, MD   Assessment & Plan: Visit Diagnoses:  1. 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 0.5 x 1.5 mm foreign body noted on lateral x-ray likely tiny glass fragment noted on x-rays from 04/21/2017.  Plan: Patient is fairly normal function of the finger other than the extension lag. He likely cut or damage to the central slip at the time of the laceration. His x-ray show loss of joint space with secondary osteoarthritic changes from the likely infection. I discussed with them we could order an MRI scan but this point the joint does not appear septic and he has fairly normal function. I discussed with him late surgery for central slip injury do not uniformly have good results. The foreign body is not causing him any problems and he has good function of his hand. He develops recurrent drainage or erythema at the joint he can return and if he decides he like to proceed with MRI scan he can call. Thank for the opportunity to see him in consultation  Follow-Up Instructions: No Follow-up on file.   Orders:  No orders of the defined types were placed in this encounter.  No orders of the defined types were placed in this encounter.     Procedures: No procedures performed   Clinical Data: No additional findings.   Subjective: Chief Complaint  Patient presents with  . Left Ring Finger - Pain    HPI 66 year old male with a traumatic injury left ring finger at the PIP joint when he cut it on a glass last the October or November. He had persistent drainage and was seen and treated with antibiotics with IM . Rocephin  injection and 15 days of Cipro. He states the drainage stopped. He's had persistent swelling in the PIP joint but has full flexion of his finger and has 25-30 extension lag at the PIP joint. Is here since his had some continued prominence in the ring finger at the PIP joint was some swelling the extension lag and inability to wear his wedding band on that finger.  Review of Systems positive for diabetes with neuropathy. Bilateral right artery disease, hypertension, hyperlipidemia, COPD, positive tobacco history otherwise negative as it pertains his history of present illness.   Objective: Vital Signs: BP 120/78   Pulse 75   Physical Exam  Constitutional: He is oriented to person, place, and time. He appears well-developed and well-nourished.  HENT:  Head: Normocephalic and atraumatic.  Eyes: EOM are normal. Pupils are equal, round, and reactive to light.  Neck: No tracheal deviation present. No thyromegaly present.  Cardiovascular: Normal rate.   Pulmonary/Chest: Effort normal. He has no wheezes.  Abdominal: Soft. Bowel sounds are normal.  Musculoskeletal:  Neck shoulder and elbow range of motion is full. He has a healed oblique laceration over the left ring PIP joint on the dorsum. 2530 extension lag. He can flex with fingertip to distal palmar crease. He has some fusiform swelling of the PIP joint  and slight crepitus with range of motion. Good grip strength. Sensation the fingertip is normal. Flexor profundus and flexor sublimis testing is normal. Good capillary refill. No injury to other digits.  Neurological: He is alert and oriented to person, place, and time.  Skin: Skin is warm and dry. Capillary refill takes less than 2 seconds.  Psychiatric: He has a normal mood and affect. His behavior is normal. Judgment and thought content normal.    Ortho Exam  Specialty Comments:  No specialty comments available.  Imaging: Multiple view hand x-rays reviewed from 04/28/2017 which shows  narrowing of the PIP joint. Tiny foreign body seen just on lateral overlying the PIP joint likely related to his injury last year with possibly a tiny glass fragment remaining. No changes suggesting ostia.   PMFS History: Patient Active Problem List   Diagnosis Date Noted  . BPH (benign prostatic hyperplasia) 10/31/2014  . Systolic ejection murmur 36/14/4315  . Bilateral carotid bruits 10/31/2014  . Erectile dysfunction 11/05/2013  . Bilateral carotid artery disease ([place]) 04/11/2013  . hyperlipidemia 04/11/2013  . HTN (hypertension) 04/11/2013  . COPD exacerbation ([place]) 04/11/2013  . Diabetes mellitus type 2 controlled 04/11/2013  . Diabetic neuropathy, type II diabetes mellitus ([place]) 03/15/2013   Past Medical History:  Diagnosis Date  . COPD (chronic obstructive pulmonary disease) ([place])   . Diabetes mellitus without complication ([place])   . GERD (gastroesophageal reflux disease)   . Hyperlipidemia   . Hypertension     Family History  Problem Relation Age of Onset  . Cancer Father   . Diabetes Sister   . Stroke Brother   . Healthy Sister   . Healthy Sister     Past Surgical History:  Procedure Laterality Date  . Thumb surgery     Social History   Occupational History  . Not on file.   Social History Main Topics  . Smoking status: Former Smoker    Packs/day: 0.50    Years: 60.00    Types: Cigarettes  . Smokeless tobacco: Never Used     Comment: Smoking one cig per day now.   . Alcohol use No  . Drug use: No  . Sexual activity: Not on file

## 2017-06-06 ENCOUNTER — Other Ambulatory Visit: Payer: Self-pay | Admitting: Nurse Practitioner

## 2017-08-29 ENCOUNTER — Ambulatory Visit: Payer: Medicare Other | Admitting: Family Medicine

## 2017-08-30 ENCOUNTER — Other Ambulatory Visit: Payer: Self-pay | Admitting: Family Medicine

## 2017-09-21 ENCOUNTER — Ambulatory Visit (INDEPENDENT_AMBULATORY_CARE_PROVIDER_SITE_OTHER): Payer: Medicare Other

## 2017-09-21 ENCOUNTER — Encounter: Payer: Self-pay | Admitting: Family Medicine

## 2017-09-21 ENCOUNTER — Ambulatory Visit (INDEPENDENT_AMBULATORY_CARE_PROVIDER_SITE_OTHER): Payer: Medicare Other | Admitting: Family Medicine

## 2017-09-21 VITALS — BP 157/67 | HR 73 | Temp 98.5°F | Ht 74.0 in | Wt 207.0 lb

## 2017-09-21 DIAGNOSIS — K000 Anodontia: Secondary | ICD-10-CM | POA: Diagnosis not present

## 2017-09-21 DIAGNOSIS — N400 Enlarged prostate without lower urinary tract symptoms: Secondary | ICD-10-CM

## 2017-09-21 DIAGNOSIS — E559 Vitamin D deficiency, unspecified: Secondary | ICD-10-CM | POA: Diagnosis not present

## 2017-09-21 DIAGNOSIS — I10 Essential (primary) hypertension: Secondary | ICD-10-CM | POA: Diagnosis not present

## 2017-09-21 DIAGNOSIS — M79671 Pain in right foot: Secondary | ICD-10-CM

## 2017-09-21 DIAGNOSIS — Z23 Encounter for immunization: Secondary | ICD-10-CM | POA: Diagnosis not present

## 2017-09-21 DIAGNOSIS — K08109 Complete loss of teeth, unspecified cause, unspecified class: Secondary | ICD-10-CM

## 2017-09-21 DIAGNOSIS — G8929 Other chronic pain: Secondary | ICD-10-CM | POA: Diagnosis not present

## 2017-09-21 DIAGNOSIS — E11610 Type 2 diabetes mellitus with diabetic neuropathic arthropathy: Secondary | ICD-10-CM | POA: Diagnosis not present

## 2017-09-21 IMAGING — DX DG CHEST 2V
3 series · 3 of 3 positions shown · non-contrast
Comparison: [DATE] and prior radiographs

CLINICAL DATA: Hypertension and type 2 diabetes.

EXAM:
CHEST  2 VIEW

[chest pa (1 of 2)]
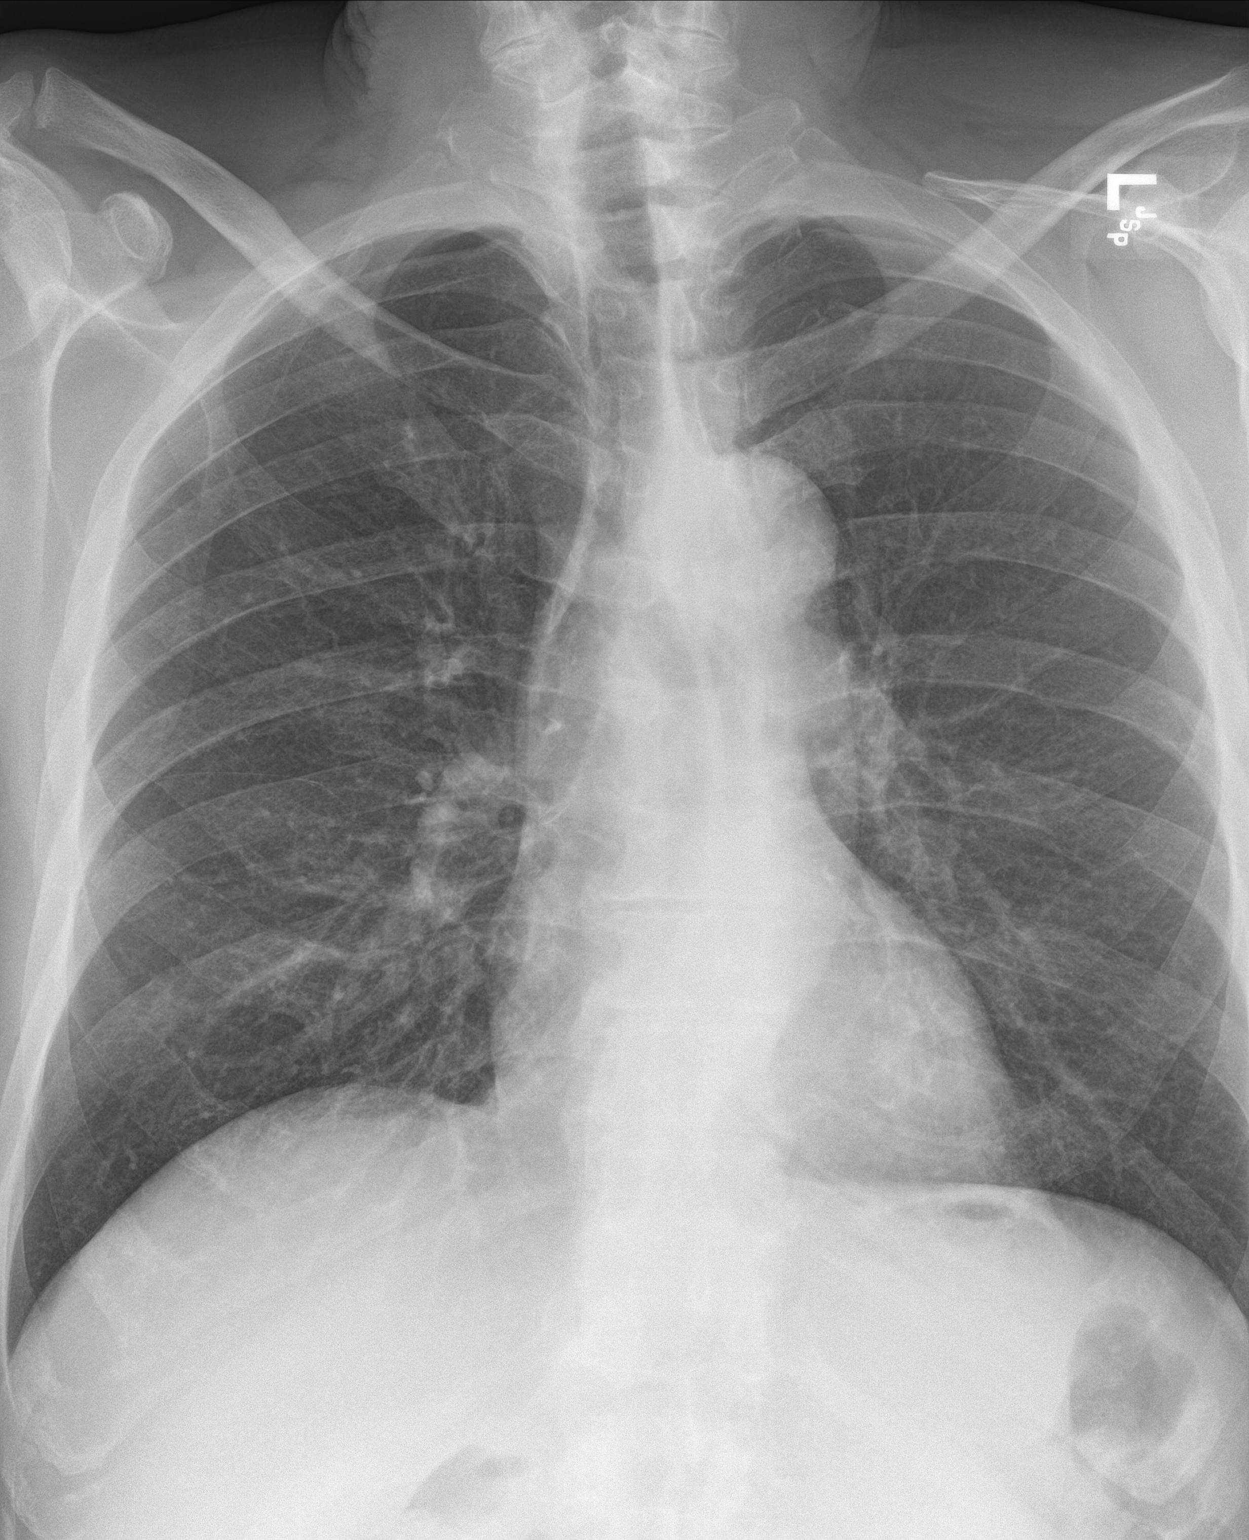

[chest lat]
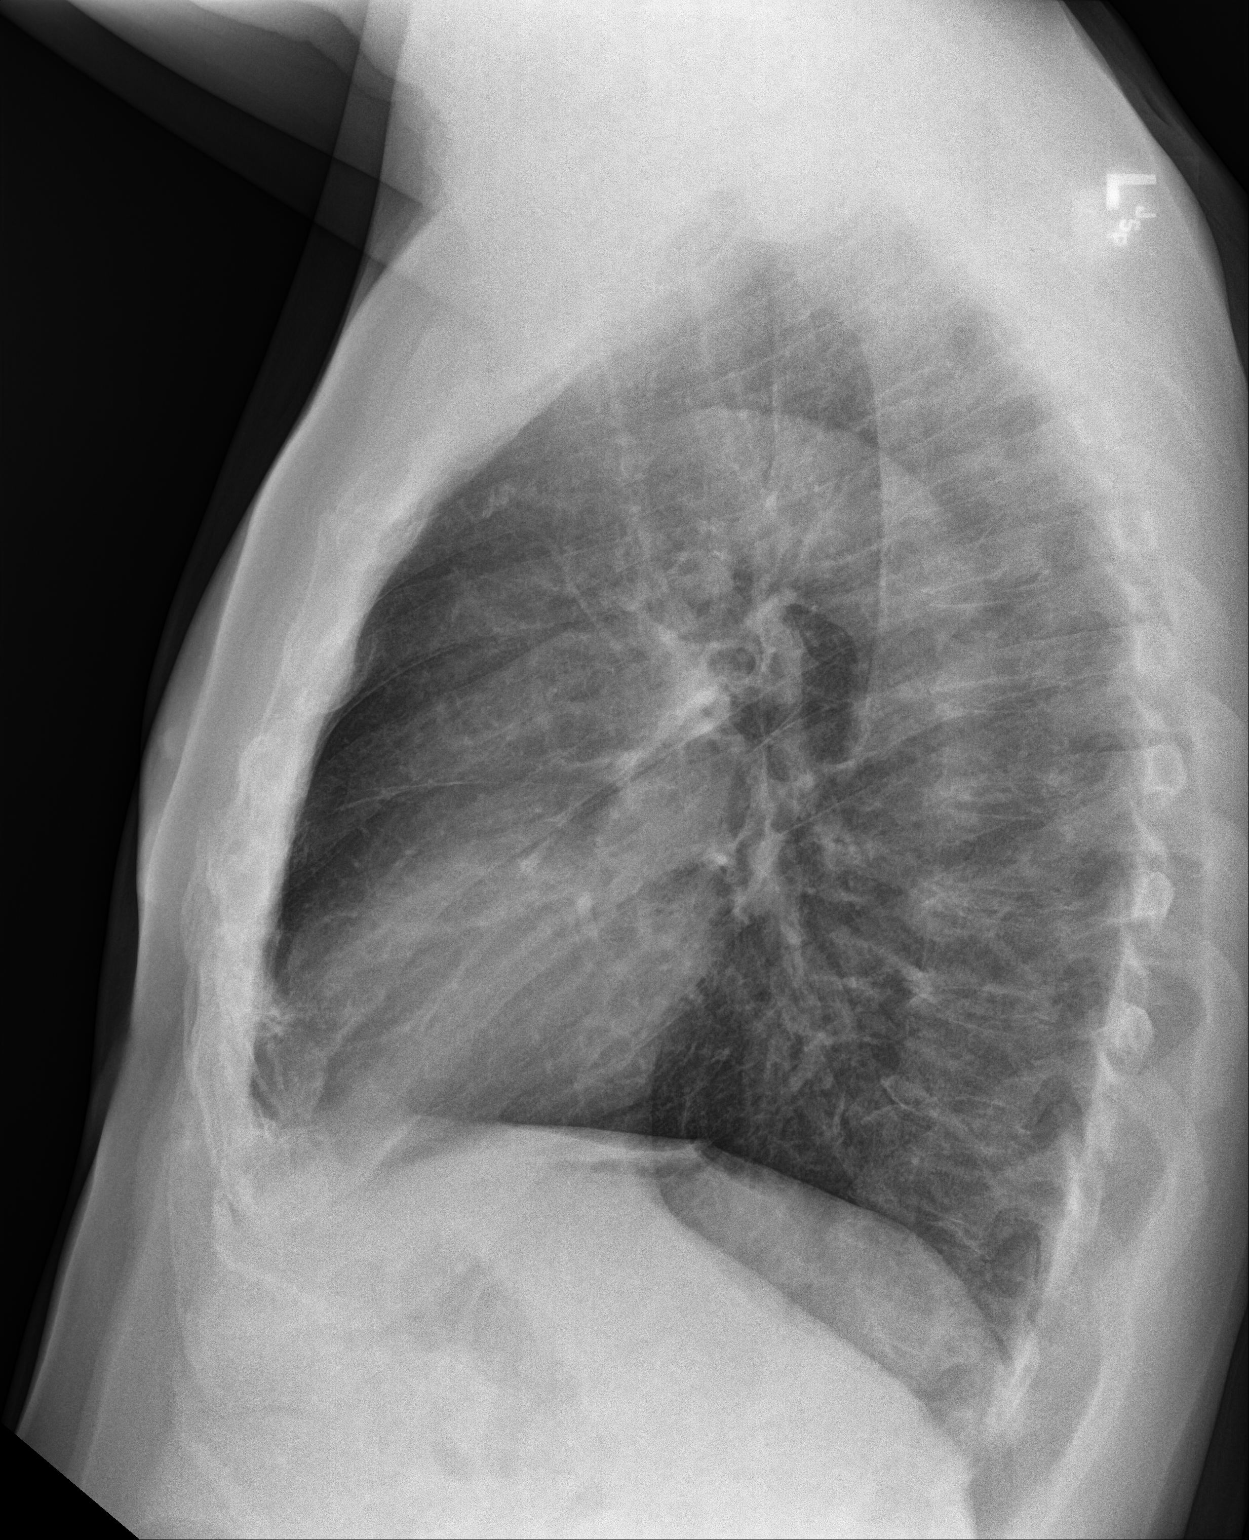

[chest pa (2 of 2)]
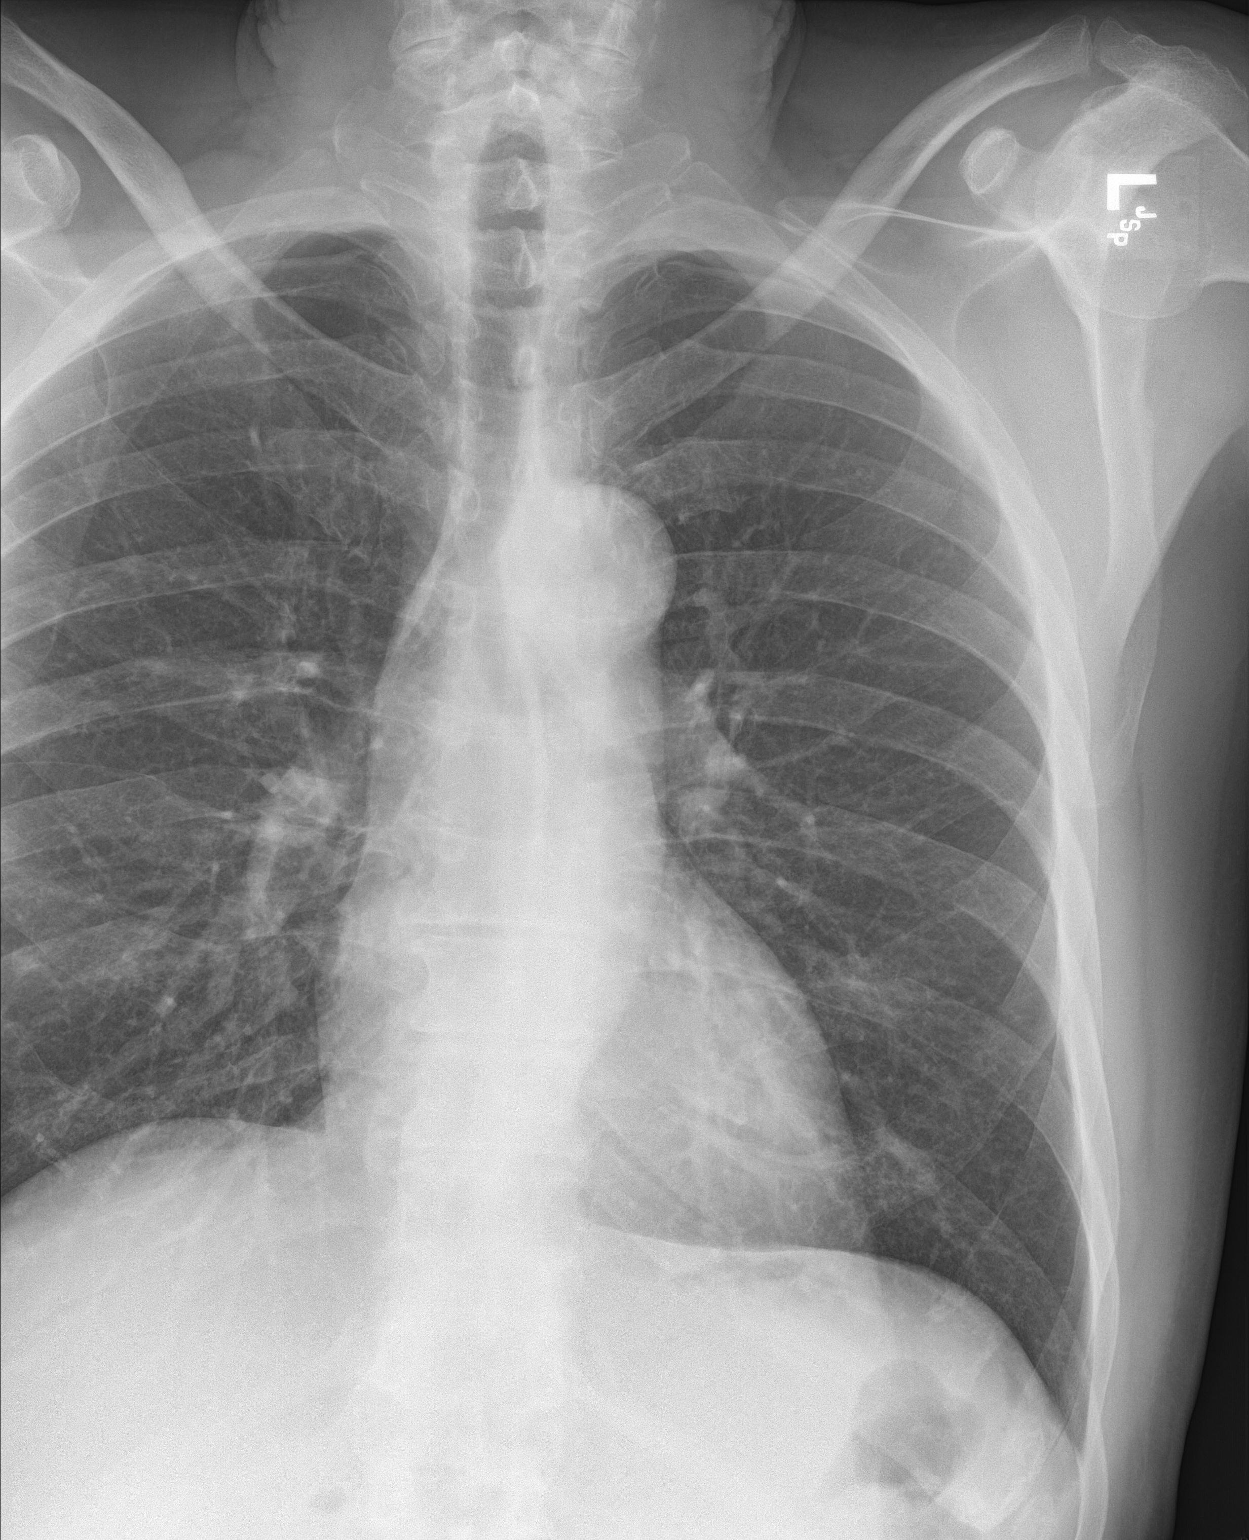

[3 of 3 positions shown; findings below may reference images not displayed]

FINDINGS: The cardiomediastinal silhouette is unremarkable.

There is no evidence of focal airspace disease, pulmonary edema,
suspicious pulmonary nodule/mass, pleural effusion, or pneumothorax.
No acute bony abnormalities are identified.
IMPRESSION: No active cardiopulmonary disease.

## 2017-09-21 MED ORDER — TADALAFIL 5 MG PO TABS: 5.0000 mg | ORAL_TABLET | Freq: Every day | ORAL | 2 refills | Status: DC | PRN

## 2017-09-21 NOTE — Patient Instructions (Addendum)
Medicare Annual Wellness Visit  [facility] and the medical providers at [facility] strive to bring you the best medical care.  In doing so we not only want to address your current medical conditions and concerns but also to detect new conditions early and prevent illness, disease and health-related problems.    Medicare offers a yearly Wellness Visit which allows our clinical staff to assess your need for preventative services including immunizations, lifestyle education, counseling to decrease risk of preventable diseases and screening for fall risk and other medical concerns.    This visit is provided free of charge (no copay) for all Medicare recipients. The clinical pharmacists at [facility] have begun to conduct these Wellness Visits which will also include a thorough review of all your medications.    As you primary medical provider recommend that you make an appointment for your Annual Wellness Visit if you have not done so already this year.  You may set up this appointment before you leave today or you may call back ([phone]) and schedule an appointment.  Please make sure when you call that you mention that you are scheduling your Annual Wellness Visit with the clinical pharmacist so that the appointment may be made for the proper length of time.     Continue current medications. Continue good therapeutic lifestyle changes which include good diet and exercise. Fall precautions discussed with patient. If an FOBT was given today- please return it to our front desk. If you are over 65 years old - you may need Prevnar 37 or the adult Pneumonia vaccine.  **Flu shots are available--- please call and schedule a FLU-CLINIC appointment**  After your visit with [place] today you will receive a survey in the mail or online from [employer] regarding your care with [place]. Please take a moment to fill this out. Your feedback is very  important to [place] as you can help [place] better understand your patient needs as well as improve your experience and satisfaction. WE CARE ABOUT YOU!!!   The patient should check blood pressures and blood sugars more frequently at home. We would ask him to let our nurse recheck his blood pressure when he comes in for his blood work early next week because of the elevated readings we received today on this. We would highly recommend that he get some shoes because of his ongoing foot pain with his diabetes. Continue to follow-up with cardiology as planned

## 2017-09-21 NOTE — Progress Notes (Signed)
Subjective:    [name], male    DOB: [birth year], 66 y.o.   MRN: [id number]  HPI Pt here for follow up and management of chronic medical problems which includes diabetes and hypertension. He is taking medication regularly.  Patient is here today with no complaints.  He does have diabetes.  He needs shoes to help with his feet to prevent from getting ulcers.  He will return to the office for fasting blood work.  He will get a chest x-ray today and will be given an FOBT to return.  He will also get his flu shot today.  He is requesting a refill on Cialis.  The patient's diabetic shoes have had to be tossed and he is now wearing regular shoes and has a lot more pain in his foot especially the right foot since he has not been wearing the therapeutic shoes for diabetes.  The patient denies any chest pain or shortness of breath.  He denies any trouble with his stomach including nausea vomiting diarrhea blood in the stool or black tarry bowel movements.  He is passing his water well.  He is requesting a refill on the Cialis.  He also has an upcoming appointment with the dentist to get some dentures which will be good for him.     Patient Active Problem List   Diagnosis Date Noted  . BPH (benign prostatic hyperplasia) 10/31/2014  . Systolic ejection murmur 40/98/1191  . Bilateral carotid bruits 10/31/2014  . Erectile dysfunction 11/05/2013  . Bilateral carotid artery disease ([place]) 04/11/2013  . hyperlipidemia 04/11/2013  . HTN (hypertension) 04/11/2013  . COPD exacerbation ([place]) 04/11/2013  . Diabetes mellitus type 2 controlled 04/11/2013  . Diabetic neuropathy, type II diabetes mellitus ([place]) 03/15/2013   Outpatient Encounter Prescriptions as of 09/21/2017  Medication Sig  . ACCU-CHEK AVIVA PLUS test strip TWICE DAILY  . amLODipine (NORVASC) 10 MG tablet Take 10 mg by mouth daily.  Marland Kitchen amLODipine (NORVASC) 10 MG tablet TAKE ONE (1) TABLET EACH DAY  . aspirin EC 81 MG tablet  Take 81 mg by mouth daily.  Marland Kitchen BIOTIN 5000 PO Take 1 capsule by mouth daily.  . cholecalciferol (VITAMIN D) 1000 UNITS tablet Take 1,000 Units by mouth daily.  Marland Kitchen escitalopram (LEXAPRO) 10 MG tablet TAKE ONE (1) TABLET EACH DAY  . JANUVIA 100 MG tablet TAKE ONE (1) TABLET EACH DAY  . Lancets (ACCU-CHEK SOFT TOUCH) lancets Test blood sugar twice daily DX E11.9  . lisinopril (PRINIVIL,ZESTRIL) 10 MG tablet TAKE ONE TABLET BY MOUTH TWICE DAILY  . meloxicam (MOBIC) 7.5 MG tablet TAKE ONE TABLET BY MOUTH TWICE DAILY  . metFORMIN (GLUCOPHAGE) 1000 MG tablet TAKE ONE TABLET TWICE A DAY WITH FOOD  . montelukast (SINGULAIR) 10 MG tablet TAKE ONE TABLET DAILY AT BEDTIME  . pravastatin (PRAVACHOL) 40 MG tablet TAKE ONE (1) TABLET EACH DAY  . ranitidine (ZANTAC) 150 MG tablet TAKE ONE TABLET BY MOUTH TWICE DAILY  . tadalafil (CIALIS) 5 MG tablet Take 5 mg by mouth daily as needed for erectile dysfunction.   No facility-administered encounter medications on file as of 09/21/2017.      Review of Systems  Constitutional: Negative.   HENT: Negative.   Eyes: Negative.   Respiratory: Negative.   Cardiovascular: Negative.   Gastrointestinal: Negative.   Endocrine: Negative.   Genitourinary: Negative.   Musculoskeletal: Negative.   Skin: Negative.   Allergic/Immunologic: Negative.   Neurological: Negative.   Hematological: Negative.  Psychiatric/Behavioral: Negative.        Objective:   Physical Exam  Constitutional: He is oriented to person, place, and time. He appears well-developed and well-nourished. No distress.  The patient is pleasant and alert.  HENT:  Head: Normocephalic and atraumatic.  Right Ear: External ear normal.  Left Ear: External ear normal.  Nose: Nose normal.  Mouth/Throat: Oropharynx is clear and moist. No oropharyngeal exudate.  He is edentulous but has plans to get dentures soon.  Eyes: Pupils are equal, round, and reactive to light. Conjunctivae and EOM are normal.  Right eye exhibits no discharge. Left eye exhibits no discharge. No scleral icterus.  He also has an eye exam coming up in December.  Neck: Normal range of motion. Neck supple. No thyromegaly present.  He has bilateral carotid bruits  Cardiovascular: Normal rate, regular rhythm and intact distal pulses.  Exam reveals no friction rub.   Murmur heard. The heart is regular at 72/min with a grade 3/6 systolic ejection murmur  Pulmonary/Chest: Effort normal and breath sounds normal. No respiratory distress. He has no wheezes. He has no rales.  Lungs were clear anteriorly and posteriorly and no axillary adenopathy  Abdominal: Soft. Bowel sounds are normal. He exhibits no mass. There is no tenderness. There is no rebound and no guarding.  No abdominal tenderness masses bruits or inguinal adenopathy  Musculoskeletal: Normal range of motion. He exhibits no edema.  Lymphadenopathy:    He has no cervical adenopathy.  Neurological: He is alert and oriented to person, place, and time. He has normal reflexes. No cranial nerve deficit.  Skin: Skin is warm and dry. No rash noted.  Psychiatric: He has a normal mood and affect. His behavior is normal. Judgment and thought content normal.  Nursing note and vitals reviewed.   BP (!) 157/67 (BP Location: Right Arm)   Pulse 73   Temp 98.5 F (36.9 C) (Oral)   Ht _0  (1.88 m)   Wt 207 lb (93.9 kg)   BMI 26.58 kg/m   Repeat blood pressure 160/62 left arm large cuff     Assessment & Plan:  1. Type 2 diabetes mellitus with diabetic neuropathic arthropathy, without long-term current use of insulin (HCC) -Check blood sugars regularly at home.  The patient reports blood sugars at home running in the 130s in the morning and 135 during the day. - DG Chest 2 View; Future - CBC with Differential/Platelet; Future - Lipid panel; Future - Bayer DCA Hb A1c Waived; Future  2. Vitamin D deficiency -Continue with vitamin D replacement pending results of lab  work - CBC with Differential/Platelet; Future - VITAMIN D 25 Hydroxy (Vit-D Deficiency, Fractures); Future  3. Benign prostatic hyperplasia, unspecified whether lower urinary tract symptoms present -No complaints with this today.  Patient did request a refill on his Cialis. - CBC with Differential/Platelet; Future  4. Essential hypertension -The blood pressure was elevated today on 2 different occasions.  He says he is taking his medicine regularly.  He is going to come back in for lab work and we will recheck his blood pressure at that time.  If his blood pressure remains elevated, we may need to increase his Prinivil or Zestril up to 20 mg. - DG Chest 2 View; Future - BMP8+EGFR; Future - CBC with Differential/Platelet; Future - Lipid panel; Future - Hepatic function panel; Future  5. Edentulous -Patient has plans to get dentures soon  6. Chronic pain in right foot -Patient does have arthropathies in the  foot which necessitate him having better fitting shoes that are specifically designed because of his diabetes to reduce the amount of pain he is having in his foot.  Meds ordered this encounter  Medications  . tadalafil (CIALIS) 5 MG tablet    Sig: Take 1 tablet (5 mg total) by mouth daily as needed for erectile dysfunction.    Dispense:  10 tablet    Refill:  2   Patient Instructions                       Medicare Annual Wellness Visit  [facility] and the medical providers at [facility] strive to bring you the best medical care.  In doing so we not only want to address your current medical conditions and concerns but also to detect new conditions early and prevent illness, disease and health-related problems.    Medicare offers a yearly Wellness Visit which allows our clinical staff to assess your need for preventative services including immunizations, lifestyle education, counseling to decrease risk of preventable diseases and screening for fall risk and  other medical concerns.    This visit is provided free of charge (no copay) for all Medicare recipients. The clinical pharmacists at [facility] have begun to conduct these Wellness Visits which will also include a thorough review of all your medications.    As you primary medical provider recommend that you make an appointment for your Annual Wellness Visit if you have not done so already this year.  You may set up this appointment before you leave today or you may call back ([phone]) and schedule an appointment.  Please make sure when you call that you mention that you are scheduling your Annual Wellness Visit with the clinical pharmacist so that the appointment may be made for the proper length of time.     Continue current medications. Continue good therapeutic lifestyle changes which include good diet and exercise. Fall precautions discussed with patient. If an FOBT was given today- please return it to our front desk. If you are over 38 years old - you may need Prevnar 54 or the adult Pneumonia vaccine.  **Flu shots are available--- please call and schedule a FLU-CLINIC appointment**  After your visit with [place] today you will receive a survey in the mail or online from [employer] regarding your care with [place]. Please take a moment to fill this out. Your feedback is very important to [place] as you can help [place] better understand your patient needs as well as improve your experience and satisfaction. WE CARE ABOUT YOU!!!   The patient should check blood pressures and blood sugars more frequently at home. We would ask him to let our nurse recheck his blood pressure when he comes in for his blood work early next week because of the elevated readings we received today on this. We would highly recommend that he get some shoes because of his ongoing foot pain with his diabetes.  Arrie Senate MD

## 2017-09-25 ENCOUNTER — Other Ambulatory Visit: Payer: Medicare Other

## 2017-09-25 DIAGNOSIS — N400 Enlarged prostate without lower urinary tract symptoms: Secondary | ICD-10-CM

## 2017-09-25 DIAGNOSIS — I10 Essential (primary) hypertension: Secondary | ICD-10-CM

## 2017-09-25 DIAGNOSIS — E559 Vitamin D deficiency, unspecified: Secondary | ICD-10-CM

## 2017-09-25 DIAGNOSIS — E11610 Type 2 diabetes mellitus with diabetic neuropathic arthropathy: Secondary | ICD-10-CM | POA: Diagnosis not present

## 2017-09-25 LAB — BAYER DCA HB A1C WAIVED: HB A1C (BAYER DCA - WAIVED): 7.2 % — ABNORMAL HIGH (ref <7.0)

## 2017-09-26 LAB — BMP8+EGFR
BUN/Creatinine Ratio: 13 (ref 10–24)
BUN: 12 mg/dL (ref 8–27)
CALCIUM: 9.9 mg/dL (ref 8.6–10.2)
CO2: 24 mmol/L (ref 20–29)
Chloride: 102 mmol/L (ref 96–106)
Creatinine, Ser: 0.96 mg/dL (ref 0.76–1.27)
GFR calc Af Amer: 95 mL/min/{1.73_m2} (ref >59)
GFR calc non Af Amer: 82 mL/min/{1.73_m2} (ref >59)
GLUCOSE: 119 mg/dL — AB (ref 65–99)
POTASSIUM: 5.1 mmol/L (ref 3.5–5.2)
SODIUM: 140 mmol/L (ref 134–144)

## 2017-09-26 LAB — LIPID PANEL
CHOLESTEROL TOTAL: 144 mg/dL (ref 100–199)
Chol/HDL Ratio: 2.8 ratio (ref 0.0–5.0)
HDL: 51 mg/dL (ref >39)
LDL CALC: 79 mg/dL (ref 0–99)
Triglycerides: 68 mg/dL (ref 0–149)
VLDL Cholesterol Cal: 14 mg/dL (ref 5–40)

## 2017-09-26 LAB — CBC WITH DIFFERENTIAL/PLATELET
BASOS ABS: 0 10*3/uL (ref 0.0–0.2)
Basos: 0 %
EOS (ABSOLUTE): 0.2 10*3/uL (ref 0.0–0.4)
EOS: 3 %
HEMATOCRIT: 38.2 % (ref 37.5–51.0)
HEMOGLOBIN: 12.3 g/dL — AB (ref 13.0–17.7)
IMMATURE GRANULOCYTES: 0 %
Immature Grans (Abs): 0 10*3/uL (ref 0.0–0.1)
LYMPHS ABS: 1.6 10*3/uL (ref 0.7–3.1)
Lymphs: 19 %
MCH: 29.6 pg (ref 26.6–33.0)
MCHC: 32.2 g/dL (ref 31.5–35.7)
MCV: 92 fL (ref 79–97)
MONOCYTES: 8 %
Monocytes Absolute: 0.7 10*3/uL (ref 0.1–0.9)
NEUTROS PCT: 70 %
Neutrophils Absolute: 5.9 10*3/uL (ref 1.4–7.0)
Platelets: 300 10*3/uL (ref 150–379)
RBC: 4.16 x10E6/uL (ref 4.14–5.80)
RDW: 14.6 % (ref 12.3–15.4)
WBC: 8.4 10*3/uL (ref 3.4–10.8)

## 2017-09-26 LAB — HEPATIC FUNCTION PANEL
ALK PHOS: 70 IU/L (ref 39–117)
ALT: 10 IU/L (ref 0–44)
AST: 13 IU/L (ref 0–40)
Albumin: 4.1 g/dL (ref 3.6–4.8)
Bilirubin Total: 0.2 mg/dL (ref 0.0–1.2)
Bilirubin, Direct: 0.08 mg/dL (ref 0.00–0.40)
Total Protein: 6.2 g/dL (ref 6.0–8.5)

## 2017-09-26 LAB — VITAMIN D 25 HYDROXY (VIT D DEFICIENCY, FRACTURES): VIT D 25 HYDROXY: 54.6 ng/mL (ref 30.0–100.0)

## 2017-10-05 ENCOUNTER — Other Ambulatory Visit: Payer: Self-pay | Admitting: *Deleted

## 2017-10-05 DIAGNOSIS — E1140 Type 2 diabetes mellitus with diabetic neuropathy, unspecified: Secondary | ICD-10-CM

## 2017-10-05 DIAGNOSIS — L84 Corns and callosities: Secondary | ICD-10-CM

## 2017-10-10 ENCOUNTER — Encounter: Payer: Self-pay | Admitting: *Deleted

## 2017-10-18 DIAGNOSIS — E1140 Type 2 diabetes mellitus with diabetic neuropathy, unspecified: Secondary | ICD-10-CM | POA: Diagnosis not present

## 2017-10-18 DIAGNOSIS — L84 Corns and callosities: Secondary | ICD-10-CM | POA: Diagnosis not present

## 2017-11-23 ENCOUNTER — Other Ambulatory Visit: Payer: Self-pay | Admitting: Family Medicine

## 2017-11-29 ENCOUNTER — Encounter: Payer: Self-pay | Admitting: Family Medicine

## 2017-11-29 ENCOUNTER — Ambulatory Visit (INDEPENDENT_AMBULATORY_CARE_PROVIDER_SITE_OTHER): Payer: Medicare Other | Admitting: Family Medicine

## 2017-11-29 VITALS — BP 189/81 | HR 74 | Temp 97.7°F | Ht 74.0 in | Wt 211.0 lb

## 2017-11-29 DIAGNOSIS — I10 Essential (primary) hypertension: Secondary | ICD-10-CM

## 2017-11-29 NOTE — Progress Notes (Signed)
Subjective: CC: htn PCP: Chipper Herb, MD YQM:VHQIO Omar Todd is a 67 y.o. male presenting to clinic today for:  1. Hypertension Patient reports Blood pressure at home: 180/80-90s; Meds: He reports he has been taking amlodipine 1 time per day and lisinopril 10 mg 1 time per day.  He reports intermittent headache.  Denies dizziness, visual changes, nausea, vomiting, chest pain, LE swelling, abdominal pain or shortness of breath.  He does not think he has been eating any more salt than normal.  He does drink diet sodas every day.  He is a daily smoker, smoking half a pack per day.   ROS: Per HPI  Allergies  Allergen Reactions  . Penicillins Other (See Comments)    Pt. States he "passed out"   Past Medical History:  Diagnosis Date  . COPD (chronic obstructive pulmonary disease) ([place])   . Diabetes mellitus without complication ([place])   . GERD (gastroesophageal reflux disease)   . Hyperlipidemia   . Hypertension     Current Outpatient Medications:  .  ACCU-CHEK AVIVA PLUS test strip, TWICE DAILY, Disp: 200 each, Rfl: 2 .  amLODipine (NORVASC) 10 MG tablet, Take 10 mg by mouth daily., Disp: , Rfl:  .  amLODipine (NORVASC) 10 MG tablet, TAKE ONE (1) TABLET EACH DAY, Disp: 90 tablet, Rfl: 1 .  aspirin EC 81 MG tablet, Take 81 mg by mouth daily., Disp: , Rfl:  .  BIOTIN 5000 PO, Take 1 capsule by mouth daily., Disp: , Rfl:  .  cholecalciferol (VITAMIN D) 1000 UNITS tablet, Take 1,000 Units by mouth daily., Disp: , Rfl:  .  escitalopram (LEXAPRO) 10 MG tablet, TAKE ONE (1) TABLET EACH DAY, Disp: 90 tablet, Rfl: 0 .  JANUVIA 100 MG tablet, TAKE ONE (1) TABLET EACH DAY, Disp: 90 tablet, Rfl: 1 .  Lancets (ACCU-CHEK SOFT TOUCH) lancets, Test blood sugar twice daily DX E11.9, Disp: 200 each, Rfl: 5 .  lisinopril (PRINIVIL,ZESTRIL) 10 MG tablet, TAKE ONE TABLET BY MOUTH TWICE DAILY, Disp: 180 tablet, Rfl: 1 .  meloxicam (MOBIC) 7.5 MG tablet, TAKE ONE TABLET BY MOUTH TWICE DAILY, Disp: 180  tablet, Rfl: 0 .  metFORMIN (GLUCOPHAGE) 1000 MG tablet, TAKE ONE TABLET TWICE A DAY WITH FOOD, Disp: 180 tablet, Rfl: 1 .  montelukast (SINGULAIR) 10 MG tablet, TAKE ONE TABLET DAILY AT BEDTIME, Disp: 90 tablet, Rfl: 3 .  pravastatin (PRAVACHOL) 40 MG tablet, TAKE ONE (1) TABLET EACH DAY, Disp: 90 tablet, Rfl: 1 .  ranitidine (ZANTAC) 150 MG tablet, TAKE ONE TABLET BY MOUTH TWICE DAILY, Disp: 180 tablet, Rfl: 1 .  tadalafil (CIALIS) 5 MG tablet, Take 1 tablet (5 mg total) by mouth daily as needed for erectile dysfunction., Disp: 10 tablet, Rfl: 2 Social History   Socioeconomic History  . Marital status: Married    Spouse name: Not on file  . Number of children: 4  . Years of education: Not on file  . Highest education level: Not on file  Social Needs  . Financial resource strain: Not on file  . Food insecurity - worry: Not on file  . Food insecurity - inability: Not on file  . Transportation needs - medical: Not on file  . Transportation needs - non-medical: Not on file  Occupational History  . Not on file  Tobacco Use  . Smoking status: Former Smoker    Packs/day: 0.50    Years: 60.00    Pack years: 30.00    Types: Cigarettes  .  Smokeless tobacco: Never Used  . Tobacco comment: Smoking one cig per day now.   Substance and Sexual Activity  . Alcohol use: No    Alcohol/week: 0.0 oz  . Drug use: No  . Sexual activity: Not on file  Other Topics Concern  . Not on file  Social History Narrative   Wife of almost 9 years died in 2023-11-21.     Family History  Problem Relation Age of Onset  . Cancer Father   . Diabetes Sister   . Stroke Brother   . Healthy Sister   . Healthy Sister     Objective: Office vital signs reviewed. BP (!) 189/81   Pulse 74   Temp 97.7 F (36.5 C) (Oral)   Ht 6\' 2"  (1.88 m)   Wt 211 lb (95.7 kg)   BMI 27.09 kg/m   Physical Examination:  General: Awake, alert, No acute distress, smells of tobacco smoke HEENT: Normal    Neck: No masses  palpated. No lymphadenopathy    Eyes: PERRLA, extraocular membranes intact, sclera white    Throat: moist mucus membranes Cardio: regular rate and rhythm, S1S2 heard, 2/6 SEM at bilateral sternal borders but more prominent on the RSB. Pulm: Globally decreased breath sounds appreciated.  Otherwise, clear to auscultation bilaterally, no wheezes, rhonchi or rales; normal work of breathing on room air Extremities: Warm, no edema. Neuro: Cranial nerves II through XII grossly intact.  Alert and oriented x3.  No focal neurologic deficits.  Assessment/ Plan: 67 y.o. male   1. Essential hypertension Blood pressure not controlled during today's visit.  It appears that he is actually prescribed amlodipine 10 mg daily and lisinopril 10 mg twice daily.  However patient is only been taking lisinopril once daily.  My plan had been to increase his lisinopril to 20 mg daily.  I think that the twice daily dosing is fine.  He has this prescription at home with plenty of pills.  I reviewed his dosing of both medications, and outlined them clearly in his AVS.  Teach back method was performed today.  He will follow-up in 2 days for BMP given adjustment of his ACE inhibitor.  He will follow-up in 2 weeks for repeat blood pressures.  If persistently elevated, could consider increasing lisinopril further versus adding an additional agent.  Smoking cessation was highly encouraged.  Patient is pre-contemplative. - Basic Metabolic Panel; Future   Orders Placed This Encounter  Procedures  . Basic Metabolic Panel    Standing Status:   Future    Standing Expiration Date:   11/29/2018    Strict return precautions and reasons for emergent evaluation in the emergency department review with patient.  They voiced understanding and will follow-up as needed.   [name], DO [place] [phone]

## 2017-11-29 NOTE — Patient Instructions (Signed)
You are on 2 blood pressure medications:  1.  Amlodipine 10 mg: You should take 1 tablet by mouth 1 time a day  2.  Lisinopril 10 mg: You should take 1 tablet by mouth 2 times a day.  I recommend that you stop smoking.  This will help improve your blood pressure.  Come back in 2 days for labs.  Follow-up in 2 weeks for recheck of your blood pressure.   Hypertension Hypertension is another name for high blood pressure. High blood pressure forces your heart to work harder to pump blood. This can cause problems over time. There are two numbers in a blood pressure reading. There is a top number (systolic) over a bottom number (diastolic). It is best to have a blood pressure below 120/80. Healthy choices can help lower your blood pressure. You may need medicine to help lower your blood pressure if:  Your blood pressure cannot be lowered with healthy choices.  Your blood pressure is higher than 130/80.  Follow these instructions at home: Eating and drinking  If directed, follow the DASH eating plan. This diet includes: ? Filling half of your plate at each meal with fruits and vegetables. ? Filling one quarter of your plate at each meal with whole grains. Whole grains include whole wheat pasta, brown rice, and whole grain bread. ? Eating or drinking low-fat dairy products, such as skim milk or low-fat yogurt. ? Filling one quarter of your plate at each meal with low-fat (lean) proteins. Low-fat proteins include fish, skinless chicken, eggs, beans, and tofu. ? Avoiding fatty meat, cured and processed meat, or chicken with skin. ? Avoiding premade or processed food.  Eat less than 1,500 mg of salt (sodium) a day.  Limit alcohol use to no more than 1 drink a day for nonpregnant women and 2 drinks a day for men. One drink equals 12 oz of beer, 5 oz of wine, or 1 oz of hard liquor. Lifestyle  Work with your doctor to stay at a healthy weight or to lose weight. Ask your doctor what the best  weight is for you.  Get at least 30 minutes of exercise that causes your heart to beat faster (aerobic exercise) most days of the week. This may include walking, swimming, or biking.  Get at least 30 minutes of exercise that strengthens your muscles (resistance exercise) at least 3 days a week. This may include lifting weights or pilates.  Do not use any products that contain nicotine or tobacco. This includes cigarettes and e-cigarettes. If you need help quitting, ask your doctor.  Check your blood pressure at home as told by your doctor.  Keep all follow-up visits as told by your doctor. This is important. Medicines  Take over-the-counter and prescription medicines only as told by your doctor. Follow directions carefully.  Do not skip doses of blood pressure medicine. The medicine does not work as well if you skip doses. Skipping doses also puts you at risk for problems.  Ask your doctor about side effects or reactions to medicines that you should watch for. Contact a doctor if:  You think you are having a reaction to the medicine you are taking.  You have headaches that keep coming back (recurring).  You feel dizzy.  You have swelling in your ankles.  You have trouble with your vision. Get help right away if:  You get a very bad headache.  You start to feel confused.  You feel weak or numb.  You feel  faint.  You get very bad pain in your: ? Chest. ? Belly (abdomen).  You throw up (vomit) more than once.  You have trouble breathing. Summary  Hypertension is another name for high blood pressure.  Making healthy choices can help lower blood pressure. If your blood pressure cannot be controlled with healthy choices, you may need to take medicine. This information is not intended to replace advice given to you by your health care provider. Make sure you discuss any questions you have with your health care provider. Document Released: 04/25/2008 Document Revised:  10/05/2016 Document Reviewed: 10/05/2016 Elsevier Interactive Patient Education  [employer].

## 2017-12-13 ENCOUNTER — Ambulatory Visit (INDEPENDENT_AMBULATORY_CARE_PROVIDER_SITE_OTHER): Payer: Medicare Other | Admitting: Family Medicine

## 2017-12-13 ENCOUNTER — Encounter: Payer: Self-pay | Admitting: Family Medicine

## 2017-12-13 VITALS — BP 166/71 | HR 79 | Temp 98.1°F | Ht 73.0 in | Wt 209.0 lb

## 2017-12-13 DIAGNOSIS — I10 Essential (primary) hypertension: Secondary | ICD-10-CM

## 2017-12-13 MED ORDER — LISINOPRIL 40 MG PO TABS: 40.0000 mg | ORAL_TABLET | Freq: Every day | ORAL | 0 refills | Status: DC

## 2017-12-13 NOTE — Patient Instructions (Signed)
I have increased your lisinopril dose to 40 mg daily.  I sent this in as a single dose.  For your blood pressure you should be taking:   Lisinopril 40 mg 1 time per day  Amlodipine 10 mg 1 time per day  Follow-up with me in 2 weeks for blood pressure recheck.  Continue to monitor blood pressures daily.  Record these and bring them back to your appointment in 2 weeks.

## 2017-12-13 NOTE — Progress Notes (Signed)
Subjective: CC: HTN PCP: Chipper Herb, MD Omar Todd is a 67 y.o. male presenting to clinic today for:  1. Hypertension Patient reports Blood pressure at home: 160-170/80-90s; Meds: Compliant with Norvasc 10 qd and Lisinopril 10mg  BID, Side effects: none.  Denies headache, dizziness, visual changes, nausea, vomiting, chest pain, LE swelling, abdominal pain or shortness of breath.   ROS: Per HPI  Allergies  Allergen Reactions  . Penicillins Other (See Comments)    Pt. States he "passed out"   Past Medical History:  Diagnosis Date  . COPD (chronic obstructive pulmonary disease) ([place])   . Diabetes mellitus without complication ([place])   . GERD (gastroesophageal reflux disease)   . Hyperlipidemia   . Hypertension     Current Outpatient Medications:  .  ACCU-CHEK AVIVA PLUS test strip, TWICE DAILY, Disp: 200 each, Rfl: 2 .  amLODipine (NORVASC) 10 MG tablet, Take 10 mg by mouth daily., Disp: , Rfl:  .  amLODipine (NORVASC) 10 MG tablet, TAKE ONE (1) TABLET EACH DAY, Disp: 90 tablet, Rfl: 1 .  aspirin EC 81 MG tablet, Take 81 mg by mouth daily., Disp: , Rfl:  .  BIOTIN 5000 PO, Take 1 capsule by mouth daily., Disp: , Rfl:  .  cholecalciferol (VITAMIN D) 1000 UNITS tablet, Take 1,000 Units by mouth daily., Disp: , Rfl:  .  escitalopram (LEXAPRO) 10 MG tablet, TAKE ONE (1) TABLET EACH DAY, Disp: 90 tablet, Rfl: 0 .  JANUVIA 100 MG tablet, TAKE ONE (1) TABLET EACH DAY, Disp: 90 tablet, Rfl: 1 .  Lancets (ACCU-CHEK SOFT TOUCH) lancets, Test blood sugar twice daily DX E11.9, Disp: 200 each, Rfl: 5 .  lisinopril (PRINIVIL,ZESTRIL) 10 MG tablet, TAKE ONE TABLET BY MOUTH TWICE DAILY, Disp: 180 tablet, Rfl: 1 .  meloxicam (MOBIC) 7.5 MG tablet, TAKE ONE TABLET BY MOUTH TWICE DAILY, Disp: 180 tablet, Rfl: 0 .  metFORMIN (GLUCOPHAGE) 1000 MG tablet, TAKE ONE TABLET TWICE A DAY WITH FOOD, Disp: 180 tablet, Rfl: 1 .  montelukast (SINGULAIR) 10 MG tablet, TAKE ONE TABLET DAILY AT  BEDTIME, Disp: 90 tablet, Rfl: 3 .  pravastatin (PRAVACHOL) 40 MG tablet, TAKE ONE (1) TABLET EACH DAY, Disp: 90 tablet, Rfl: 1 .  ranitidine (ZANTAC) 150 MG tablet, TAKE ONE TABLET BY MOUTH TWICE DAILY, Disp: 180 tablet, Rfl: 1 .  tadalafil (CIALIS) 5 MG tablet, Take 1 tablet (5 mg total) by mouth daily as needed for erectile dysfunction., Disp: 10 tablet, Rfl: 2 Social History   Socioeconomic History  . Marital status: Married    Spouse name: Not on file  . Number of children: 4  . Years of education: Not on file  . Highest education level: Not on file  Social Needs  . Financial resource strain: Not on file  . Food insecurity - worry: Not on file  . Food insecurity - inability: Not on file  . Transportation needs - medical: Not on file  . Transportation needs - non-medical: Not on file  Occupational History  . Not on file  Tobacco Use  . Smoking status: Former Smoker    Packs/day: 0.50    Years: 60.00    Pack years: 30.00    Types: Cigarettes  . Smokeless tobacco: Never Used  . Tobacco comment: Smoking one cig per day now.   Substance and Sexual Activity  . Alcohol use: No    Alcohol/week: 0.0 oz  . Drug use: No  . Sexual activity: Not on file  Other  Topics Concern  . Not on file  Social History Narrative   Wife of almost 58 years died in 11/30/23.     Family History  Problem Relation Age of Onset  . Cancer Father   . Diabetes Sister   . Stroke Brother   . Healthy Sister   . Healthy Sister     Objective: Office vital signs reviewed. BP (!) 166/71   Pulse 79   Temp 98.1 F (36.7 C) (Oral)   Ht 6\' 1"  (1.854 m)   Wt 209 lb (94.8 kg)   BMI 27.57 kg/m   Physical Examination:  General: Awake, alert, appears older than stated age, smells of tobacco smoke, No acute distress HEENT: MMM, sclera white Cardio: regular rate and rhythm, S1S2 heard, 3/6 murmur appreciated along the left sternal border. Pulm: clear to auscultation bilaterally, no wheezes, rhonchi or rales;  normal work of breathing on room air Ext: No edema; warm.  Assessment/ Plan: 67 y.o. male   1. Essential hypertension Blood pressure still not at goal.  Increase lisinopril to 40 mg daily.  Home care instructions were reviewed.  AVS reviewed and provided to patient.  Patient to follow-up in 3-4 days for repeat BM P given increase in ACE inhibitor.  He will follow-up with me in 2-4 weeks for blood pressure recheck.  Continue to monitor blood pressures daily.  Log provided to patient today for recording.  Smoking cessation highly encouraged but patient is pre-contemplative. - Basic Metabolic Panel; Future   Orders Placed This Encounter  Procedures  . Basic Metabolic Panel    Standing Status:   Future    Standing Expiration Date:   12/13/2018   Meds ordered this encounter  Medications  . lisinopril (PRINIVIL,ZESTRIL) 40 MG tablet    Sig: Take 1 tablet (40 mg total) by mouth daily.    Dispense:  90 tablet    Refill:  [place], DO [place] [phone]

## 2017-12-23 ENCOUNTER — Other Ambulatory Visit: Payer: Self-pay | Admitting: Family Medicine

## 2018-02-12 ENCOUNTER — Ambulatory Visit: Payer: Medicare Other | Admitting: Family Medicine

## 2018-02-13 ENCOUNTER — Encounter: Payer: Self-pay | Admitting: Family Medicine

## 2018-02-19 ENCOUNTER — Other Ambulatory Visit: Payer: Self-pay | Admitting: Family Medicine

## 2018-04-04 ENCOUNTER — Encounter: Payer: Self-pay | Admitting: Family Medicine

## 2018-04-04 ENCOUNTER — Ambulatory Visit (INDEPENDENT_AMBULATORY_CARE_PROVIDER_SITE_OTHER): Payer: Medicare Other | Admitting: Family Medicine

## 2018-04-04 VITALS — BP 198/94 | HR 82 | Temp 98.2°F | Ht 73.0 in | Wt 206.0 lb

## 2018-04-04 DIAGNOSIS — J42 Unspecified chronic bronchitis: Secondary | ICD-10-CM | POA: Diagnosis not present

## 2018-04-04 DIAGNOSIS — I10 Essential (primary) hypertension: Secondary | ICD-10-CM

## 2018-04-04 DIAGNOSIS — R011 Cardiac murmur, unspecified: Secondary | ICD-10-CM | POA: Diagnosis not present

## 2018-04-04 DIAGNOSIS — E1140 Type 2 diabetes mellitus with diabetic neuropathy, unspecified: Secondary | ICD-10-CM | POA: Diagnosis not present

## 2018-04-04 DIAGNOSIS — R030 Elevated blood-pressure reading, without diagnosis of hypertension: Secondary | ICD-10-CM | POA: Diagnosis not present

## 2018-04-04 DIAGNOSIS — E559 Vitamin D deficiency, unspecified: Secondary | ICD-10-CM | POA: Diagnosis not present

## 2018-04-04 DIAGNOSIS — N400 Enlarged prostate without lower urinary tract symptoms: Secondary | ICD-10-CM

## 2018-04-04 DIAGNOSIS — R0989 Other specified symptoms and signs involving the circulatory and respiratory systems: Secondary | ICD-10-CM

## 2018-04-04 LAB — BAYER DCA HB A1C WAIVED: HB A1C: 7.7 % — AB (ref <7.0)

## 2018-04-04 MED ORDER — HYDROCHLOROTHIAZIDE 12.5 MG PO TABS: 12.5000 mg | ORAL_TABLET | Freq: Every day | ORAL | 0 refills | Status: DC

## 2018-04-04 MED ORDER — BLOOD GLUCOSE METER KIT: PACK | 1 refills | Status: DC

## 2018-04-04 NOTE — Progress Notes (Signed)
Subjective:    [name], male    DOB: Jun 17, 1951, 67 y.o.   MRN: [id number]  HPI Pt here for follow up and management of chronic medical problems which includes diabetes and hypertension. He is taking medication regularly.  The patient is doing well today with no specific complaints.  He does need a new blood sugar meter and we will give him a prescription for this.  He he is due to get lab work and return in FOBT.  Both blood pressures were elevated today after being checked on 2 occasions.  The patient says he is feeling well.  After questioning him more closely he does indicate that he may be out of 1 of the medicines for his blood pressure and he is going to bring all of his bottles back for [place] to review this.  I did explain to him the importance of taking his medicines regularly so as not to have a stroke or heart attack.  He will bring the medicines in today so we can determine which medicine he is adequate.  If he has been taking the 2 medicines for his blood pressure regularly we will add HCTZ to this.  He also needs to see the cardiologist yearly because of bilateral carotid stenoses and systolic ejection murmur.  He denies any chest pain shortness of breath trouble swallowing heartburn indigestion nausea vomiting diarrhea blood in the stool or black tarry bowel movements.  He says he is feeling well.  The last visit that I see with the cardiologist was in October 2017.  We will make sure that he is up-to-date on this also   Patient Active Problem List   Diagnosis Date Noted  . BPH (benign prostatic hyperplasia) 10/31/2014  . Systolic ejection murmur 71/04/2693  . Bilateral carotid bruits 10/31/2014  . Erectile dysfunction 11/05/2013  . Bilateral carotid artery disease ([place]) 04/11/2013  . hyperlipidemia 04/11/2013  . HTN (hypertension) 04/11/2013  . COPD exacerbation ([place]) 04/11/2013  . Diabetes mellitus type 2 controlled 04/11/2013  . Diabetic neuropathy, type II  diabetes mellitus ([place]) 03/15/2013   Outpatient Encounter Medications as of 04/04/2018  Medication Sig  . ACCU-CHEK AVIVA PLUS test strip TWICE DAILY  . amLODipine (NORVASC) 10 MG tablet Take 10 mg by mouth daily.  Marland Kitchen amLODipine (NORVASC) 10 MG tablet TAKE ONE (1) TABLET EACH DAY  . aspirin EC 81 MG tablet Take 81 mg by mouth daily.  Marland Kitchen BIOTIN 5000 PO Take 1 capsule by mouth daily.  . cholecalciferol (VITAMIN D) 1000 UNITS tablet Take 1,000 Units by mouth daily.  Marland Kitchen escitalopram (LEXAPRO) 10 MG tablet TAKE ONE (1) TABLET EACH DAY  . JANUVIA 100 MG tablet TAKE ONE (1) TABLET EACH DAY  . Lancets (ACCU-CHEK SOFT TOUCH) lancets Test blood sugar twice daily DX E11.9  . lisinopril (PRINIVIL,ZESTRIL) 40 MG tablet TAKE ONE (1) TABLET EACH DAY  . meloxicam (MOBIC) 7.5 MG tablet TAKE ONE TABLET BY MOUTH TWICE DAILY  . metFORMIN (GLUCOPHAGE) 1000 MG tablet TAKE ONE TABLET TWICE A DAY WITH FOOD  . montelukast (SINGULAIR) 10 MG tablet TAKE ONE TABLET DAILY AT BEDTIME  . pravastatin (PRAVACHOL) 40 MG tablet TAKE ONE (1) TABLET EACH DAY  . ranitidine (ZANTAC) 150 MG tablet TAKE ONE TABLET BY MOUTH TWICE DAILY  . tadalafil (CIALIS) 5 MG tablet Take 1 tablet (5 mg total) by mouth daily as needed for erectile dysfunction.   No facility-administered encounter medications on file as of 04/04/2018.  Review of Systems  Constitutional: Negative.   HENT: Negative.   Eyes: Negative.   Respiratory: Negative.   Cardiovascular: Negative.   Gastrointestinal: Negative.   Endocrine: Negative.   Genitourinary: Negative.   Musculoskeletal: Negative.   Skin: Negative.   Allergic/Immunologic: Negative.   Neurological: Negative.   Hematological: Negative.   Psychiatric/Behavioral: Negative.        Objective:   Physical Exam  Constitutional: He is oriented to person, place, and time. He appears well-developed and well-nourished. No distress.  The patient is alert and seems to have no complaints and this is  typical for him at every visit.  HENT:  Head: Normocephalic and atraumatic.  Right Ear: External ear normal.  Left Ear: External ear normal.  Nose: Nose normal.  Mouth/Throat: Oropharynx is clear and moist. No oropharyngeal exudate.  He is edentulous.  Eyes: Pupils are equal, round, and reactive to light. Conjunctivae and EOM are normal. Right eye exhibits no discharge. Left eye exhibits no discharge.  He says his last eye exam was in November.  Neck: Normal range of motion. Neck supple. No thyromegaly present.  Bilateral carotid bruits without thyromegaly or anterior cervical adenopathy  Cardiovascular: Normal rate, regular rhythm and intact distal pulses.  Murmur heard. Heart has a regular rate and rhythm at 72/min with a grade 3/6 systolic ejection murmur  Pulmonary/Chest: Effort normal and breath sounds normal. He has no wheezes. He has no rales.  Clear anteriorly and posteriorly and no axillary adenopathy  Abdominal: Soft. Bowel sounds are normal. He exhibits no mass. There is no tenderness. There is no guarding.  No abdominal tenderness masses organ enlargement or bruits or inguinal adenopathy  Musculoskeletal: Normal range of motion. He exhibits no edema or deformity.  Lymphadenopathy:    He has no cervical adenopathy.  Neurological: He is alert and oriented to person, place, and time. He has normal reflexes. No cranial nerve deficit.  Skin: Skin is warm and dry. No rash noted.  Psychiatric: He has a normal mood and affect. His behavior is normal. Judgment and thought content normal.  Patient's affect mood and behavior appeared normal.  I am not sure that he is aware of the importance of him taking his medicines regularly and not running out of them.  We did reemphasize this.  Nursing note and vitals reviewed.   BP (!) 187/89 (BP Location: Left Arm)   Pulse 82   Temp 98.2 F (36.8 C) (Oral)   Ht _0  (1.854 m)   Wt 206 lb (93.4 kg)   BMI 27.18 kg/m        Assessment &  Plan:  1. Type 2 diabetes mellitus with diabetic neuropathy, without long-term current use of insulin ([place]) -Continue current treatment pending results of lab work a new monitor was given to him today as he says the one at home is not working properly. - BMP8+EGFR - CBC with Differential/Platelet - Lipid panel - Bayer DCA Hb A1c Waived  2. Essential hypertension -On questioning the patient more closely he may not be taking his medicines regularly as he has 1 bottle of medicine at home that is empty and he is not sure which one this is.  The blood pressure is very elevated today. - BMP8+EGFR - CBC with Differential/Platelet - Lipid panel - Hepatic function panel  3. Vitamin D deficiency -Continue with vitamin D replacement pending results of lab work - CBC with Differential/Platelet - VITAMIN D 25 Hydroxy (Vit-D Deficiency, Fractures)  4. Benign prostatic hyperplasia,  unspecified whether lower urinary tract symptoms present -No complaints today with voiding - CBC with Differential/Platelet  5. Chronic bronchitis, unspecified chronic bronchitis type ([place]) -No complaints today with his breathing.  6. Elevated blood pressure reading -Will add HCTZ if patient is taking Accupril and Norvasc at maximum doses.  7. Bilateral carotid bruits -Make sure that this is followed up by cardiology  8. Systolic ejection murmur -Check on next cardiology visit  Meds ordered this encounter  Medications  . blood glucose meter kit and supplies    Sig: Check BS TID and PRN dx.E11.9    Dispense:  1 each    Refill:  1    Order Specific Question:   Number of strips    Answer:   100    Order Specific Question:   Number of lancets    Answer:   100   Patient Instructions                       Medicare Annual Wellness Visit  [facility] and the medical providers at [facility] strive to bring you the best medical care.  In doing so we not only want to address your current  medical conditions and concerns but also to detect new conditions early and prevent illness, disease and health-related problems.    Medicare offers a yearly Wellness Visit which allows our clinical staff to assess your need for preventative services including immunizations, lifestyle education, counseling to decrease risk of preventable diseases and screening for fall risk and other medical concerns.    This visit is provided free of charge (no copay) for all Medicare recipients. The clinical pharmacists at [facility] have begun to conduct these Wellness Visits which will also include a thorough review of all your medications.    As you primary medical provider recommend that you make an appointment for your Annual Wellness Visit if you have not done so already this year.  You may set up this appointment before you leave today or you may call back ([phone]) and schedule an appointment.  Please make sure when you call that you mention that you are scheduling your Annual Wellness Visit with the clinical pharmacist so that the appointment may be made for the proper length of time.     Continue current medications. Continue good therapeutic lifestyle changes which include good diet and exercise. Fall precautions discussed with patient. If an FOBT was given today- please return it to our front desk. If you are over 4 years old - you may need Prevnar 65 or the adult Pneumonia vaccine.  **Flu shots are available--- please call and schedule a FLU-CLINIC appointment**  After your visit with [place] today you will receive a survey in the mail or online from [employer] regarding your care with [place]. Please take a moment to fill this out. Your feedback is very important to [place] as you can help [place] better understand your patient needs as well as improve your experience and satisfaction. WE CARE ABOUT YOU!!!   The patient is encouraged to bring his medication bottles with him to every  visit He will bring his bottles back as he has 1 bottle at home that is empty and this may be the one for his blood pressure and could account for the elevated readings today. He understands the importance of keeping the blood pressure under better control to avoid stroke and heart attack He also says he has been keeping  his appointments with a cardiologist and he sees them yearly.  The last note that I see would have been October 2017.  Arrie Senate MD

## 2018-04-04 NOTE — Patient Instructions (Addendum)
Medicare Annual Wellness Visit  [facility] and the medical providers at [facility] strive to bring you the best medical care.  In doing so we not only want to address your current medical conditions and concerns but also to detect new conditions early and prevent illness, disease and health-related problems.    Medicare offers a yearly Wellness Visit which allows our clinical staff to assess your need for preventative services including immunizations, lifestyle education, counseling to decrease risk of preventable diseases and screening for fall risk and other medical concerns.    This visit is provided free of charge (no copay) for all Medicare recipients. The clinical pharmacists at [facility] have begun to conduct these Wellness Visits which will also include a thorough review of all your medications.    As you primary medical provider recommend that you make an appointment for your Annual Wellness Visit if you have not done so already this year.  You may set up this appointment before you leave today or you may call back ([phone]) and schedule an appointment.  Please make sure when you call that you mention that you are scheduling your Annual Wellness Visit with the clinical pharmacist so that the appointment may be made for the proper length of time.     Continue current medications. Continue good therapeutic lifestyle changes which include good diet and exercise. Fall precautions discussed with patient. If an FOBT was given today- please return it to our front desk. If you are over 62 years old - you may need Prevnar 90 or the adult Pneumonia vaccine.  **Flu shots are available--- please call and schedule a FLU-CLINIC appointment**  After your visit with [place] today you will receive a survey in the mail or online from [employer] regarding your care with [place]. Please take a moment to fill this out. Your feedback is very  important to [place] as you can help [place] better understand your patient needs as well as improve your experience and satisfaction. WE CARE ABOUT YOU!!!   The patient is encouraged to bring his medication bottles with him to every visit He will bring his bottles back as he has 1 bottle at home that is empty and this may be the one for his blood pressure and could account for the elevated readings today. He understands the importance of keeping the blood pressure under better control to avoid stroke and heart attack He also says he has been keeping his appointments with a cardiologist and he sees them yearly.  The last note that I see would have been October 2017.

## 2018-04-04 NOTE — Addendum Note (Signed)
Addended by: Zannie Cove on: 04/04/2018 01:52 PM   Modules accepted: Orders

## 2018-04-05 LAB — CBC WITH DIFFERENTIAL/PLATELET
BASOS: 0 %
Basophils Absolute: 0 10*3/uL (ref 0.0–0.2)
EOS (ABSOLUTE): 0.2 10*3/uL (ref 0.0–0.4)
Eos: 2 %
HEMATOCRIT: 37.7 % (ref 37.5–51.0)
Hemoglobin: 12.6 g/dL — ABNORMAL LOW (ref 13.0–17.7)
IMMATURE GRANS (ABS): 0 10*3/uL (ref 0.0–0.1)
IMMATURE GRANULOCYTES: 0 %
Lymphocytes Absolute: 1.8 10*3/uL (ref 0.7–3.1)
Lymphs: 20 %
MCH: 29.4 pg (ref 26.6–33.0)
MCHC: 33.4 g/dL (ref 31.5–35.7)
MCV: 88 fL (ref 79–97)
Monocytes Absolute: 0.6 10*3/uL (ref 0.1–0.9)
Monocytes: 7 %
NEUTROS PCT: 71 %
Neutrophils Absolute: 6.3 10*3/uL (ref 1.4–7.0)
PLATELETS: 341 10*3/uL (ref 150–379)
RBC: 4.28 x10E6/uL (ref 4.14–5.80)
RDW: 16 % — ABNORMAL HIGH (ref 12.3–15.4)
WBC: 8.9 10*3/uL (ref 3.4–10.8)

## 2018-04-05 LAB — HEPATIC FUNCTION PANEL
ALT: 9 IU/L (ref 0–44)
AST: 11 IU/L (ref 0–40)
Albumin: 4.1 g/dL (ref 3.6–4.8)
Alkaline Phosphatase: 81 IU/L (ref 39–117)
BILIRUBIN, DIRECT: 0.06 mg/dL (ref 0.00–0.40)
Bilirubin Total: <0.2 mg/dL (ref 0.0–1.2)
Total Protein: 6.5 g/dL (ref 6.0–8.5)

## 2018-04-05 LAB — BMP8+EGFR
BUN/Creatinine Ratio: 11 (ref 10–24)
BUN: 10 mg/dL (ref 8–27)
CALCIUM: 10.3 mg/dL — AB (ref 8.6–10.2)
CO2: 23 mmol/L (ref 20–29)
Chloride: 103 mmol/L (ref 96–106)
Creatinine, Ser: 0.88 mg/dL (ref 0.76–1.27)
GFR, EST AFRICAN AMERICAN: 103 mL/min/{1.73_m2} (ref >59)
GFR, EST NON AFRICAN AMERICAN: 89 mL/min/{1.73_m2} (ref >59)
Glucose: 127 mg/dL — ABNORMAL HIGH (ref 65–99)
POTASSIUM: 4.5 mmol/L (ref 3.5–5.2)
SODIUM: 143 mmol/L (ref 134–144)

## 2018-04-05 LAB — LIPID PANEL
Chol/HDL Ratio: 3.7 ratio (ref 0.0–5.0)
Cholesterol, Total: 189 mg/dL (ref 100–199)
HDL: 51 mg/dL (ref >39)
LDL Calculated: 103 mg/dL — ABNORMAL HIGH (ref 0–99)
TRIGLYCERIDES: 175 mg/dL — AB (ref 0–149)
VLDL CHOLESTEROL CAL: 35 mg/dL (ref 5–40)

## 2018-04-05 LAB — VITAMIN D 25 HYDROXY (VIT D DEFICIENCY, FRACTURES): VIT D 25 HYDROXY: 49 ng/mL (ref 30.0–100.0)

## 2018-04-11 ENCOUNTER — Ambulatory Visit: Payer: Medicare Other | Admitting: *Deleted

## 2018-04-11 ENCOUNTER — Encounter: Payer: Self-pay | Admitting: *Deleted

## 2018-04-11 VITALS — BP 158/72 | HR 73

## 2018-04-11 DIAGNOSIS — Z0130 Encounter for examination of blood pressure without abnormal findings: Secondary | ICD-10-CM

## 2018-04-11 NOTE — Progress Notes (Signed)
Pt here for BP ck BP 157 71 P 76  Rck BP 158 72 P73

## 2018-05-10 ENCOUNTER — Other Ambulatory Visit: Payer: Self-pay | Admitting: Family Medicine

## 2018-05-21 ENCOUNTER — Other Ambulatory Visit: Payer: Self-pay | Admitting: Family Medicine

## 2018-06-28 ENCOUNTER — Other Ambulatory Visit: Payer: Self-pay | Admitting: Family Medicine

## 2018-06-29 NOTE — Telephone Encounter (Signed)
Ov 08/08/18

## 2018-08-08 ENCOUNTER — Encounter: Payer: Self-pay | Admitting: Family Medicine

## 2018-08-08 ENCOUNTER — Ambulatory Visit (INDEPENDENT_AMBULATORY_CARE_PROVIDER_SITE_OTHER): Payer: Medicare Other | Admitting: Family Medicine

## 2018-08-08 VITALS — BP 170/75 | HR 77 | Temp 98.0°F | Ht 73.0 in | Wt 205.0 lb

## 2018-08-08 DIAGNOSIS — E559 Vitamin D deficiency, unspecified: Secondary | ICD-10-CM

## 2018-08-08 DIAGNOSIS — E1140 Type 2 diabetes mellitus with diabetic neuropathy, unspecified: Secondary | ICD-10-CM

## 2018-08-08 DIAGNOSIS — Z0000 Encounter for general adult medical examination without abnormal findings: Secondary | ICD-10-CM | POA: Diagnosis not present

## 2018-08-08 DIAGNOSIS — L84 Corns and callosities: Secondary | ICD-10-CM

## 2018-08-08 DIAGNOSIS — N400 Enlarged prostate without lower urinary tract symptoms: Secondary | ICD-10-CM

## 2018-08-08 DIAGNOSIS — I10 Essential (primary) hypertension: Secondary | ICD-10-CM

## 2018-08-08 DIAGNOSIS — R011 Cardiac murmur, unspecified: Secondary | ICD-10-CM

## 2018-08-08 DIAGNOSIS — D229 Melanocytic nevi, unspecified: Secondary | ICD-10-CM

## 2018-08-08 DIAGNOSIS — R0989 Other specified symptoms and signs involving the circulatory and respiratory systems: Secondary | ICD-10-CM

## 2018-08-08 LAB — BAYER DCA HB A1C WAIVED: HB A1C (BAYER DCA - WAIVED): 7.6 % — ABNORMAL HIGH (ref <7.0)

## 2018-08-08 LAB — URINALYSIS, COMPLETE
Bilirubin, UA: NEGATIVE
GLUCOSE, UA: NEGATIVE
Ketones, UA: NEGATIVE
LEUKOCYTES UA: NEGATIVE
Nitrite, UA: NEGATIVE
PROTEIN UA: NEGATIVE
RBC, UA: NEGATIVE
Specific Gravity, UA: 1.01 (ref 1.005–1.030)
Urobilinogen, Ur: 0.2 mg/dL (ref 0.2–1.0)
pH, UA: 7 (ref 5.0–7.5)

## 2018-08-08 LAB — MICROSCOPIC EXAMINATION
BACTERIA UA: NONE SEEN
Epithelial Cells (non renal): NONE SEEN /hpf (ref 0–10)
RBC, UA: NONE SEEN /hpf (ref 0–2)
RENAL EPITHEL UA: NONE SEEN /HPF
WBC, UA: NONE SEEN /hpf (ref 0–5)

## 2018-08-08 NOTE — Progress Notes (Addendum)
Subjective:    [name], male    DOB: 1950/12/30, 67 y.o.   MRN: [id number]  HPI Patient is here today for annual wellness exam and follow up of chronic medical problems which includes diabetes and hypertension. He is taking medication regularly.  The patient today comes in for his regular checkup and is in need of a PSA and prostate to be done today will be given an FOBT to return and will get lab work.  He is a diabetic.  His BMI is 27.18.  The patient does not check blood pressures at home.  A third reading today was 180/80 in the left arm with a wall-mounted cuff.  He says that his blood sugars at home of been running in the 60-70 range.  The patient today denies any chest pain pressure or tightness.  He denies any shortness of breath.  He denies any trouble with his stomach including nausea vomiting diarrhea blood in the stool or black tarry bowel movements.  He is passing his water without problems.  He is in need of an eye exam and plans to go to my eye doctor for this.     Patient Active Problem List   Diagnosis Date Noted  . BPH (benign prostatic hyperplasia) 10/31/2014  . Systolic ejection murmur 59/29/2446  . Bilateral carotid bruits 10/31/2014  . Erectile dysfunction 11/05/2013  . Bilateral carotid artery disease ([place]) 04/11/2013  . hyperlipidemia 04/11/2013  . HTN (hypertension) 04/11/2013  . COPD exacerbation ([place]) 04/11/2013  . Diabetes mellitus type 2 controlled 04/11/2013  . Diabetic neuropathy, type II diabetes mellitus ([place]) 03/15/2013   Outpatient Encounter Medications as of 08/08/2018  Medication Sig  . ACCU-CHEK AVIVA PLUS test strip CHECK BLOOD SUGAR 3 TIMES DAILY AND AS NEEDED  . ACCU-CHEK FASTCLIX LANCETS MISC CHECK BLOOD SUGAR 3 TIMES DAILY AND AS NEEDED  . amLODipine (NORVASC) 10 MG tablet TAKE ONE (1) TABLET EACH DAY  . aspirin EC 81 MG tablet Take 81 mg by mouth daily.  Marland Kitchen BIOTIN 5000 PO Take 1 capsule by mouth daily.  . blood glucose  meter kit and supplies Check BS TID and PRN dx.E11.9  . cholecalciferol (VITAMIN D) 1000 UNITS tablet Take 1,000 Units by mouth daily.  Marland Kitchen escitalopram (LEXAPRO) 10 MG tablet TAKE ONE (1) TABLET EACH DAY  . hydrochlorothiazide (HYDRODIURIL) 12.5 MG tablet Take 1 tablet (12.5 mg total) by mouth daily.  Marland Kitchen JANUVIA 100 MG tablet TAKE ONE (1) TABLET EACH DAY  . lisinopril (PRINIVIL,ZESTRIL) 40 MG tablet TAKE ONE (1) TABLET EACH DAY  . meloxicam (MOBIC) 7.5 MG tablet TAKE ONE TABLET BY MOUTH TWICE DAILY  . metFORMIN (GLUCOPHAGE) 1000 MG tablet TAKE ONE TABLET TWICE A DAY WITH FOOD  . montelukast (SINGULAIR) 10 MG tablet TAKE ONE TABLET DAILY AT BEDTIME  . pravastatin (PRAVACHOL) 40 MG tablet TAKE ONE (1) TABLET EACH DAY  . ranitidine (ZANTAC) 150 MG tablet TAKE ONE TABLET BY MOUTH TWICE DAILY  . tadalafil (CIALIS) 5 MG tablet Take 1 tablet (5 mg total) by mouth daily as needed for erectile dysfunction.  . [DISCONTINUED] amLODipine (NORVASC) 10 MG tablet Take 10 mg by mouth daily.   No facility-administered encounter medications on file as of 08/08/2018.      Review of Systems  Constitutional: Negative.   HENT: Negative.   Eyes: Negative.   Respiratory: Negative.   Cardiovascular: Negative.   Gastrointestinal: Negative.   Endocrine: Negative.   Genitourinary: Negative.   Musculoskeletal: Negative.  Skin: Negative.        Left shoulder - mole   Allergic/Immunologic: Negative.   Neurological: Negative.   Hematological: Negative.   Psychiatric/Behavioral: Negative.        Objective:   Physical Exam  Constitutional: He is oriented to person, place, and time. He appears well-developed and well-nourished.  The patient is pleasant and is somewhat of a hurry today.  He says that he is run out of some of his medicine and we will make sure he gets back on his blood pressure medicine regularly.  HENT:  Head: Normocephalic and atraumatic.  Right Ear: External ear normal.  Left Ear: External  ear normal.  Nose: Nose normal.  Mouth/Throat: Oropharynx is clear and moist. No oropharyngeal exudate.  The patient is edentulous.  Eyes: Pupils are equal, round, and reactive to light. Conjunctivae and EOM are normal. Right eye exhibits no discharge. Left eye exhibits no discharge. No scleral icterus.  Needs eye exam and plans to go to my eye doctor for this.  He will make sure they send [place] a copy of the checkup from there.  Neck: Normal range of motion. Neck supple. No thyromegaly present.  Radiated murmur from the heart to both carotids.  No anterior cervical nodes no thyromegaly.  Cardiovascular: Normal rate, regular rhythm and intact distal pulses. Exam reveals no gallop and no friction rub.  Murmur heard. The heart is regular at 72/min with a grade 3/6 systolic ejection murmur.  Pulmonary/Chest: Effort normal and breath sounds normal. He has no wheezes. He has no rales. He exhibits no tenderness.  Clear anteriorly and posteriorly and no axillary adenopathy or chest wall masses.  Abdominal: Soft. Bowel sounds are normal. He exhibits no mass. There is no tenderness.  No liver or spleen enlargement.  No epigastric or suprapubic tenderness.  No masses no bruits.  Genitourinary: Rectum normal and penis normal.  Genitourinary Comments: The prostate was slightly enlarged but soft and smooth.  There were no rectal masses.  The external genitalia were within normal limits and no inguinal hernias were palpable and no testicular masses were noted.  Musculoskeletal: Normal range of motion. He exhibits no edema.  Lymphadenopathy:    He has no cervical adenopathy.  Neurological: He is alert and oriented to person, place, and time. He has normal reflexes. No cranial nerve deficit.  Reflexes in both lower extremities were normal and equal bilaterally.  Skin: Skin is warm and dry. No rash noted.  The patient does have a pedunculated nevus on the left medial thigh area.  He says this is been bothering  him and he would like it removed.  He also has a warty type growth on the anterior left shoulder.  Patient also has callus formation on both feet.  Psychiatric: He has a normal mood and affect. His behavior is normal. Judgment and thought content normal.  The patient's mood affect and behavior were all normal.  Nursing note and vitals reviewed.  BP (!) 165/73 (BP Location: Left Arm)   Pulse 77   Temp 98 F (36.7 C) (Oral)   Ht '6\' 1"'  (1.854 m)   Wt 205 lb (93 kg)   BMI 27.05 kg/m         Assessment & Plan:  1. Annual physical exam -Return the FOBT -Make sure that he is taking all his medicines correctly - BMP8+EGFR - CBC with Differential/Platelet - Lipid panel - VITAMIN D 25 Hydroxy (Vit-D Deficiency, Fractures) - Hepatic function panel - PSA, total  and free - Urinalysis, Complete - Bayer DCA Hb A1c Waived  2. Type 2 diabetes mellitus with diabetic neuropathy, without long-term current use of insulin (HCC) -Says his blood sugars have been running in the 60-70 range at home. - CBC with Differential/Platelet - Bayer DCA Hb A1c Waived  3. Essential hypertension -The blood pressure was elevated on 3 occasions here on the third occasion it was 180/80.  He went further to say that he had run out of some of his medicine so we will call his pharmacy and make sure he is taking everything correctly for he comes back to remove the nevus from his left thigh and cryo on the one of the left shoulder. - BMP8+EGFR - CBC with Differential/Platelet - Hepatic function panel  4. Vitamin D deficiency -Continue current treatment pending results of lab work - CBC with Differential/Platelet - VITAMIN D 25 Hydroxy (Vit-D Deficiency, Fractures)  5. Benign prostatic hyperplasia, unspecified whether lower urinary tract symptoms present -The prostate is enlarged but soft. - CBC with Differential/Platelet - PSA, total and free - Urinalysis, Complete  6. Bilateral carotid bruits -These noises  in the neck are most likely related to the murmur from his heart.  7. Systolic ejection murmur -Follow-up regularly with a cardiologist  8. Irritated nevus -Return to the office for removal of thigh lesion and cryotherapy on the left shoulder lesion.  9.  Foot callus with diabetes -Pre-ulcerative in nature.  Patient Instructions                       Medicare Annual Wellness Visit  [facility] and the medical providers at [facility] strive to bring you the best medical care.  In doing so we not only want to address your current medical conditions and concerns but also to detect new conditions early and prevent illness, disease and health-related problems.    Medicare offers a yearly Wellness Visit which allows our clinical staff to assess your need for preventative services including immunizations, lifestyle education, counseling to decrease risk of preventable diseases and screening for fall risk and other medical concerns.    This visit is provided free of charge (no copay) for all Medicare recipients. The clinical pharmacists at [facility] have begun to conduct these Wellness Visits which will also include a thorough review of all your medications.    As you primary medical provider recommend that you make an appointment for your Annual Wellness Visit if you have not done so already this year.  You may set up this appointment before you leave today or you may call back ([phone]) and schedule an appointment.  Please make sure when you call that you mention that you are scheduling your Annual Wellness Visit with the clinical pharmacist so that the appointment may be made for the proper length of time.     Continue current medications. Continue good therapeutic lifestyle changes which include good diet and exercise. Fall precautions discussed with patient. If an FOBT was given today- please return it to our front desk. If you are over 10  years old - you may need Prevnar 2 or the adult Pneumonia vaccine.  **Flu shots are available--- please call and schedule a FLU-CLINIC appointment**  After your visit with [place] today you will receive a survey in the mail or online from [employer] regarding your care with [place]. Please take a moment to fill this out. Your feedback is very important to [place]  as you can help [place] better understand your patient needs as well as improve your experience and satisfaction. WE CARE ABOUT YOU!!!   We will asked the patient to come back to remove the pedunculated nevus on the left thigh and cauterize or freeze the irritated nevus of his left shoulder. We will make sure that he is taking all his medicines correctly and will call the drugstore to make sure that his blood pressure is rechecked at his next visit. The patient will continue with physical activity and drinking plenty of water He should return the FOBT.  Arrie Senate MD

## 2018-08-08 NOTE — Patient Instructions (Addendum)
Medicare Annual Wellness Visit  [facility] and the medical providers at [facility] strive to bring you the best medical care.  In doing so we not only want to address your current medical conditions and concerns but also to detect new conditions early and prevent illness, disease and health-related problems.    Medicare offers a yearly Wellness Visit which allows our clinical staff to assess your need for preventative services including immunizations, lifestyle education, counseling to decrease risk of preventable diseases and screening for fall risk and other medical concerns.    This visit is provided free of charge (no copay) for all Medicare recipients. The clinical pharmacists at [facility] have begun to conduct these Wellness Visits which will also include a thorough review of all your medications.    As you primary medical provider recommend that you make an appointment for your Annual Wellness Visit if you have not done so already this year.  You may set up this appointment before you leave today or you may call back ([phone]) and schedule an appointment.  Please make sure when you call that you mention that you are scheduling your Annual Wellness Visit with the clinical pharmacist so that the appointment may be made for the proper length of time.     Continue current medications. Continue good therapeutic lifestyle changes which include good diet and exercise. Fall precautions discussed with patient. If an FOBT was given today- please return it to our front desk. If you are over 22 years old - you may need Prevnar 63 or the adult Pneumonia vaccine.  **Flu shots are available--- please call and schedule a FLU-CLINIC appointment**  After your visit with [place] today you will receive a survey in the mail or online from [employer] regarding your care with [place]. Please take a moment to fill this out. Your feedback is very  important to [place] as you can help [place] better understand your patient needs as well as improve your experience and satisfaction. WE CARE ABOUT YOU!!!   We will asked the patient to come back to remove the pedunculated nevus on the left thigh and cauterize or freeze the irritated nevus of his left shoulder. We will make sure that he is taking all his medicines correctly and will call the drugstore to make sure that his blood pressure is rechecked at his next visit. The patient will continue with physical activity and drinking plenty of water He should return the FOBT.

## 2018-08-09 LAB — BMP8+EGFR
BUN / CREAT RATIO: 12 (ref 10–24)
BUN: 10 mg/dL (ref 8–27)
CO2: 23 mmol/L (ref 20–29)
Calcium: 9.9 mg/dL (ref 8.6–10.2)
Chloride: 103 mmol/L (ref 96–106)
Creatinine, Ser: 0.83 mg/dL (ref 0.76–1.27)
GFR, EST AFRICAN AMERICAN: 105 mL/min/{1.73_m2} (ref >59)
GFR, EST NON AFRICAN AMERICAN: 91 mL/min/{1.73_m2} (ref >59)
Glucose: 131 mg/dL — ABNORMAL HIGH (ref 65–99)
Potassium: 4.3 mmol/L (ref 3.5–5.2)
SODIUM: 142 mmol/L (ref 134–144)

## 2018-08-09 LAB — CBC WITH DIFFERENTIAL/PLATELET
BASOS: 0 %
Basophils Absolute: 0 10*3/uL (ref 0.0–0.2)
EOS (ABSOLUTE): 0.1 10*3/uL (ref 0.0–0.4)
EOS: 2 %
HEMATOCRIT: 36.2 % — AB (ref 37.5–51.0)
Hemoglobin: 11.8 g/dL — ABNORMAL LOW (ref 13.0–17.7)
Immature Grans (Abs): 0 10*3/uL (ref 0.0–0.1)
Immature Granulocytes: 0 %
LYMPHS ABS: 1.6 10*3/uL (ref 0.7–3.1)
Lymphs: 22 %
MCH: 28.3 pg (ref 26.6–33.0)
MCHC: 32.6 g/dL (ref 31.5–35.7)
MCV: 87 fL (ref 79–97)
MONOS ABS: 0.5 10*3/uL (ref 0.1–0.9)
Monocytes: 7 %
Neutrophils Absolute: 5.2 10*3/uL (ref 1.4–7.0)
Neutrophils: 69 %
Platelets: 376 10*3/uL (ref 150–450)
RBC: 4.17 x10E6/uL (ref 4.14–5.80)
RDW: 15 % (ref 12.3–15.4)
WBC: 7.5 10*3/uL (ref 3.4–10.8)

## 2018-08-09 LAB — LIPID PANEL
CHOL/HDL RATIO: 3.9 ratio (ref 0.0–5.0)
Cholesterol, Total: 200 mg/dL — ABNORMAL HIGH (ref 100–199)
HDL: 51 mg/dL (ref >39)
LDL Calculated: 123 mg/dL — ABNORMAL HIGH (ref 0–99)
Triglycerides: 132 mg/dL (ref 0–149)
VLDL CHOLESTEROL CAL: 26 mg/dL (ref 5–40)

## 2018-08-09 LAB — PSA, TOTAL AND FREE
PROSTATE SPECIFIC AG, SERUM: 1.5 ng/mL (ref 0.0–4.0)
PSA FREE: 0.27 ng/mL
PSA, Free Pct: 18 %

## 2018-08-09 LAB — HEPATIC FUNCTION PANEL
ALT: 7 IU/L (ref 0–44)
AST: 12 IU/L (ref 0–40)
Albumin: 4.4 g/dL (ref 3.6–4.8)
Alkaline Phosphatase: 66 IU/L (ref 39–117)
Bilirubin Total: <0.2 mg/dL (ref 0.0–1.2)
Bilirubin, Direct: 0.05 mg/dL (ref 0.00–0.40)
Total Protein: 6.6 g/dL (ref 6.0–8.5)

## 2018-08-09 LAB — VITAMIN D 25 HYDROXY (VIT D DEFICIENCY, FRACTURES): Vit D, 25-Hydroxy: 49.1 ng/mL (ref 30.0–100.0)

## 2018-08-15 ENCOUNTER — Telehealth: Payer: Self-pay | Admitting: *Deleted

## 2018-08-15 NOTE — Telephone Encounter (Signed)
Pt notified of results Verbalizes understanding 

## 2018-08-16 ENCOUNTER — Other Ambulatory Visit: Payer: Self-pay | Admitting: Family Medicine

## 2018-08-29 ENCOUNTER — Ambulatory Visit (INDEPENDENT_AMBULATORY_CARE_PROVIDER_SITE_OTHER): Payer: Medicare Other | Admitting: Family Medicine

## 2018-08-29 ENCOUNTER — Encounter: Payer: Self-pay | Admitting: Family Medicine

## 2018-08-29 VITALS — BP 175/85 | HR 85 | Temp 98.3°F | Ht 73.0 in | Wt 205.0 lb

## 2018-08-29 DIAGNOSIS — D485 Neoplasm of uncertain behavior of skin: Secondary | ICD-10-CM | POA: Diagnosis not present

## 2018-08-29 DIAGNOSIS — I10 Essential (primary) hypertension: Secondary | ICD-10-CM | POA: Diagnosis not present

## 2018-08-29 DIAGNOSIS — L919 Hypertrophic disorder of the skin, unspecified: Secondary | ICD-10-CM | POA: Diagnosis not present

## 2018-08-29 DIAGNOSIS — D229 Melanocytic nevi, unspecified: Secondary | ICD-10-CM

## 2018-08-29 DIAGNOSIS — Z23 Encounter for immunization: Secondary | ICD-10-CM

## 2018-08-29 NOTE — Addendum Note (Signed)
Addended by: Zannie Cove on: 08/29/2018 04:11 PM   Modules accepted: Orders

## 2018-08-29 NOTE — Progress Notes (Signed)
Subjective:    [name], male    DOB: 02-07-1951, 67 y.o.   MRN: [id number]  HPI Patient here today for lesion removal of left thigh and recheck of BP.  The patient comes in today for recheck of the blood pressure.  He says that he is taking his his amlodipine 10 mg daily his lisinopril 40 mg daily and 12-1/2 of HCTZ daily.  His blood pressure still elevated with doing this.  We will go ahead today and plan to increase the blood pressure medicine to 25 mg of HCTZ daily daily.  The patient also has this irritated nevus of his upper left medial thigh area right near where his underwear are and it stays irritated all the time.  We will plan to excise this in the office today.   Patient Active Problem List   Diagnosis Date Noted  . BPH (benign prostatic hyperplasia) 10/31/2014  . Systolic ejection murmur 71/04/2693  . Bilateral carotid bruits 10/31/2014  . Erectile dysfunction 11/05/2013  . Bilateral carotid artery disease ([place]) 04/11/2013  . hyperlipidemia 04/11/2013  . HTN (hypertension) 04/11/2013  . COPD exacerbation ([place]) 04/11/2013  . Diabetes mellitus type 2 controlled 04/11/2013  . Diabetic neuropathy, type II diabetes mellitus ([place]) 03/15/2013   Outpatient Encounter Medications as of 08/29/2018  Medication Sig  . ACCU-CHEK AVIVA PLUS test strip CHECK BLOOD SUGAR 3 TIMES DAILY AND AS NEEDED  . ACCU-CHEK FASTCLIX LANCETS MISC CHECK BLOOD SUGAR 3 TIMES DAILY AND AS NEEDED  . amLODipine (NORVASC) 10 MG tablet TAKE ONE (1) TABLET EACH DAY  . aspirin EC 81 MG tablet Take 81 mg by mouth daily.  Marland Kitchen BIOTIN 5000 PO Take 1 capsule by mouth daily.  . blood glucose meter kit and supplies Check BS TID and PRN dx.E11.9  . cholecalciferol (VITAMIN D) 1000 UNITS tablet Take 1,000 Units by mouth daily.  Marland Kitchen escitalopram (LEXAPRO) 10 MG tablet TAKE ONE (1) TABLET EACH DAY  . hydrochlorothiazide (HYDRODIURIL) 12.5 MG tablet Take 1 tablet (12.5 mg total) by mouth daily.  Marland Kitchen JANUVIA  100 MG tablet TAKE ONE (1) TABLET EACH DAY  . lisinopril (PRINIVIL,ZESTRIL) 40 MG tablet TAKE ONE (1) TABLET EACH DAY  . meloxicam (MOBIC) 7.5 MG tablet TAKE ONE TABLET BY MOUTH TWICE DAILY  . metFORMIN (GLUCOPHAGE) 1000 MG tablet TAKE ONE TABLET TWICE A DAY WITH FOOD  . montelukast (SINGULAIR) 10 MG tablet TAKE ONE TABLET DAILY AT BEDTIME  . pravastatin (PRAVACHOL) 40 MG tablet TAKE ONE (1) TABLET EACH DAY  . ranitidine (ZANTAC) 150 MG tablet TAKE ONE TABLET BY MOUTH TWICE DAILY  . tadalafil (CIALIS) 5 MG tablet Take 1 tablet (5 mg total) by mouth daily as needed for erectile dysfunction.   No facility-administered encounter medications on file as of 08/29/2018.       Review of Systems  Constitutional: Negative.   HENT: Negative.   Eyes: Negative.   Respiratory: Negative.   Cardiovascular: Negative.   Gastrointestinal: Negative.   Endocrine: Negative.   Genitourinary: Negative.   Musculoskeletal: Negative.   Skin: Negative.        Lesion - left thigh  Allergic/Immunologic: Negative.   Neurological: Negative.   Hematological: Negative.   Psychiatric/Behavioral: Negative.        Objective:   Physical Exam  Constitutional: He is oriented to person, place, and time. He appears well-developed and well-nourished.  The patient is pleasant and doing well and would like to have the nevus of his left upper  medial thigh removed along with following up on his blood pressure.  HENT:  Head: Normocephalic and atraumatic.  Eyes: Pupils are equal, round, and reactive to light. Conjunctivae and EOM are normal. Right eye exhibits no discharge. Left eye exhibits no discharge. No scleral icterus.  Musculoskeletal: Normal range of motion.  Neurological: He is alert and oriented to person, place, and time.  Skin: Skin is warm and dry. No rash noted.  The pedunculated nevus of the upper left medial thigh area was cleansed with Betadine under sterile conditions and the lesion was excised with a 15  blade and the area was cauterized with Monsel solution.  A pressure dressing was applied and the patient was told to let it stay clean and dry for at least 24 hours.  Psychiatric: He has a normal mood and affect. His behavior is normal. Judgment and thought content normal.  The patient's mood affect and behavior were normal for him.  Nursing note and vitals reviewed.  BP (!) 188/85 (BP Location: Left Arm)   Pulse 85   Temp 98.3 F (36.8 C) (Oral)   Ht 6' 1" (1.854 m)   Wt 205 lb (93 kg)   BMI 27.05 kg/m         Assessment & Plan:  1. Essential hypertension -Increase HCTZ to 25 mg daily and continue other medicines as doing  2. Irritated nevus -The irritated nevus of the left upper medial thigh was excised after cleansing and using Xylocaine.  The patient tolerated the procedure well and a pressure dressing was applied to the lesion which was sent out for pathology.  Patient Instructions  Keep the area of where the nevus was excised clean and dry for at least 24 hours with a pressure dressing on it. If any bleeding occurs please come back to the office. Continue with current blood pressure medicines but increase the HCTZ to 25 mg daily.  Continue with amlodipine 10 and continue with lisinopril 40. Watch sodium intake.  Arrie Senate MD

## 2018-08-29 NOTE — Patient Instructions (Signed)
Keep the area of where the nevus was excised clean and dry for at least 24 hours with a pressure dressing on it. If any bleeding occurs please come back to the office. Continue with current blood pressure medicines but increase the HCTZ to 25 mg daily.  Continue with amlodipine 10 and continue with lisinopril 40. Watch sodium intake.

## 2018-08-30 ENCOUNTER — Telehealth: Payer: Self-pay | Admitting: Family Medicine

## 2018-09-04 LAB — PATHOLOGY

## 2018-09-05 ENCOUNTER — Encounter: Payer: Self-pay | Admitting: *Deleted

## 2018-09-05 MED ORDER — HYDROCHLOROTHIAZIDE 25 MG PO TABS: 25.0000 mg | ORAL_TABLET | Freq: Every day | ORAL | 0 refills | Status: DC

## 2018-09-05 NOTE — Telephone Encounter (Signed)
Per Dr. Tawanna Sat note at last office visit, patient was supposed to increase his Hctz from 12.5 mg to 25 mg.  I sent a new prescription for Hctz 25 mg to [facility].  Tried to contact patient, no answer.

## 2018-09-12 ENCOUNTER — Other Ambulatory Visit: Payer: Medicare Other

## 2018-09-12 ENCOUNTER — Ambulatory Visit (INDEPENDENT_AMBULATORY_CARE_PROVIDER_SITE_OTHER): Payer: Medicare Other

## 2018-09-12 VITALS — BP 190/89

## 2018-09-12 DIAGNOSIS — I10 Essential (primary) hypertension: Secondary | ICD-10-CM

## 2018-09-12 DIAGNOSIS — R710 Precipitous drop in hematocrit: Secondary | ICD-10-CM

## 2018-09-12 NOTE — Progress Notes (Signed)
Patient presents today for labwork and BP check. Patient states that his BP has been running better at home. He is in no distress. Taking meds as rxd. Please review and advise

## 2018-09-13 LAB — CBC WITH DIFFERENTIAL/PLATELET
BASOS ABS: 0.1 10*3/uL (ref 0.0–0.2)
Basos: 1 %
EOS (ABSOLUTE): 0.1 10*3/uL (ref 0.0–0.4)
EOS: 1 %
HEMATOCRIT: 35.3 % — AB (ref 37.5–51.0)
Hemoglobin: 11.7 g/dL — ABNORMAL LOW (ref 13.0–17.7)
IMMATURE GRANULOCYTES: 0 %
Immature Grans (Abs): 0 10*3/uL (ref 0.0–0.1)
Lymphocytes Absolute: 1.8 10*3/uL (ref 0.7–3.1)
Lymphs: 20 %
MCH: 28.8 pg (ref 26.6–33.0)
MCHC: 33.1 g/dL (ref 31.5–35.7)
MCV: 87 fL (ref 79–97)
MONOCYTES: 7 %
MONOS ABS: 0.6 10*3/uL (ref 0.1–0.9)
Neutrophils Absolute: 6.4 10*3/uL (ref 1.4–7.0)
Neutrophils: 71 %
PLATELETS: 347 10*3/uL (ref 150–450)
RBC: 4.06 x10E6/uL — AB (ref 4.14–5.80)
RDW: 14.4 % (ref 12.3–15.4)
WBC: 9 10*3/uL (ref 3.4–10.8)

## 2018-09-13 NOTE — Telephone Encounter (Signed)
Patient seen in this office for BP check since phone call.  This encounter will now be closed

## 2018-09-21 ENCOUNTER — Telehealth: Payer: Self-pay | Admitting: Family Medicine

## 2018-09-21 ENCOUNTER — Other Ambulatory Visit: Payer: Self-pay

## 2018-09-21 MED ORDER — GLUCOSE BLOOD VI STRP: ORAL_STRIP | 3 refills | Status: DC

## 2018-09-21 NOTE — Telephone Encounter (Signed)
Done

## 2018-10-09 DIAGNOSIS — M19072 Primary osteoarthritis, left ankle and foot: Secondary | ICD-10-CM | POA: Diagnosis not present

## 2018-10-09 DIAGNOSIS — M19071 Primary osteoarthritis, right ankle and foot: Secondary | ICD-10-CM | POA: Diagnosis not present

## 2018-10-12 ENCOUNTER — Other Ambulatory Visit: Payer: Self-pay | Admitting: Family Medicine

## 2018-10-31 DIAGNOSIS — L84 Corns and callosities: Secondary | ICD-10-CM | POA: Diagnosis not present

## 2018-10-31 DIAGNOSIS — E119 Type 2 diabetes mellitus without complications: Secondary | ICD-10-CM | POA: Diagnosis not present

## 2018-11-22 ENCOUNTER — Other Ambulatory Visit: Payer: Self-pay | Admitting: Family Medicine

## 2018-11-23 ENCOUNTER — Other Ambulatory Visit: Payer: Self-pay | Admitting: Family Medicine

## 2018-12-19 ENCOUNTER — Ambulatory Visit: Payer: Medicare Other | Admitting: Family Medicine

## 2018-12-20 ENCOUNTER — Encounter: Payer: Self-pay | Admitting: Family Medicine

## 2019-01-04 ENCOUNTER — Other Ambulatory Visit: Payer: Self-pay | Admitting: Family Medicine

## 2019-01-04 ENCOUNTER — Ambulatory Visit (INDEPENDENT_AMBULATORY_CARE_PROVIDER_SITE_OTHER): Payer: Medicare Other | Admitting: Family Medicine

## 2019-01-04 VITALS — BP 211/86 | HR 80 | Temp 98.4°F | Ht 73.0 in | Wt 210.0 lb

## 2019-01-04 DIAGNOSIS — Z23 Encounter for immunization: Secondary | ICD-10-CM | POA: Diagnosis not present

## 2019-01-04 DIAGNOSIS — E1169 Type 2 diabetes mellitus with other specified complication: Secondary | ICD-10-CM | POA: Diagnosis not present

## 2019-01-04 DIAGNOSIS — I350 Nonrheumatic aortic (valve) stenosis: Secondary | ICD-10-CM | POA: Diagnosis not present

## 2019-01-04 DIAGNOSIS — E1159 Type 2 diabetes mellitus with other circulatory complications: Secondary | ICD-10-CM | POA: Diagnosis not present

## 2019-01-04 DIAGNOSIS — E1140 Type 2 diabetes mellitus with diabetic neuropathy, unspecified: Secondary | ICD-10-CM | POA: Diagnosis not present

## 2019-01-04 DIAGNOSIS — I10 Essential (primary) hypertension: Secondary | ICD-10-CM | POA: Diagnosis not present

## 2019-01-04 DIAGNOSIS — E785 Hyperlipidemia, unspecified: Secondary | ICD-10-CM

## 2019-01-04 DIAGNOSIS — I152 Hypertension secondary to endocrine disorders: Secondary | ICD-10-CM

## 2019-01-04 LAB — BAYER DCA HB A1C WAIVED: HB A1C (BAYER DCA - WAIVED): 7.5 % — ABNORMAL HIGH (ref <7.0)

## 2019-01-04 MED ORDER — METOPROLOL TARTRATE 25 MG PO TABS: 25.0000 mg | ORAL_TABLET | Freq: Two times a day (BID) | ORAL | 1 refills | Status: DC

## 2019-01-04 NOTE — Progress Notes (Signed)
Subjective: CC: HTN PCP: Chipper Herb, MD BWG:[id number] Omar Todd is a 68 y.o. male presenting to clinic today for:  1.  Type 2 Diabetes w/ Hypertension Patient reports fasting BGs 130s, Taking medication(s): Januvia 12m, Metformin 10016mBID, Pravachol 4036m He reports compliance with hydrochlorothiazide 25 mg daily, lisinopril 40 mg daily and amlodipine 10 mg daily.  His pharmacist was contacted who notes that he has consistently picked medications up except for the hydrochlorothiazide, which he is due for, Side effects: none.  Denies any chest pain, shortness of breath, lower extremity edema, dizziness.  He is an every day smoker.  He has hx aortic stenosis and was previously followed by Dr HocPercival SpanishLast eye exam: needs Last foot exam: 07/2018 done. Last A1c:  Lab Results  Component Value Date   HGBA1C 7.6 (H) 08/08/2018   Nephropathy screen indicated?: on ACE-I Last flu, zoster and/or pneumovax: PNA vaccine needed Immunization History  Administered Date(s) Administered  . Influenza, High Dose Seasonal PF 09/21/2017, 08/29/2018  . Influenza,inj,Quad PF,6+ Mos 08/21/2013, 08/25/2014, 08/31/2015, 09/14/2016  . Pneumococcal Conjugate-13 02/12/2016  . Pneumococcal Polysaccharide-23 11/29/2013    ROS: Per HPI  Allergies  Allergen Reactions  . Penicillins Other (See Comments)    Pt. States he "passed out"   Past Medical History:  Diagnosis Date  . [place] . [place] without complication [place] . GERD (gastroesophageal reflux disease)   . Hyperlipidemia   . Hypertension     Current Outpatient Medications:  .  ACCU-CHEK FASTCLIX LANCETS MISC, CHECK BLOOD SUGAR 3 TIMES DAILY AND AS NEEDED, Disp: 102 each, Rfl: 3 .  ACCU-CHEK GUIDE test strip, 3 TIMES DAILY, Disp: 100 each, Rfl: 11 .  amLODipine (NORVASC) 10 MG tablet, TAKE ONE (1) TABLET EACH DAY, Disp: 90 tablet, Rfl: 1 .  aspirin EC 81 MG tablet, Take 81 mg by mouth  daily., Disp: , Rfl:  .  BIOTIN 5000 PO, Take 1 capsule by mouth daily., Disp: , Rfl:  .  blood glucose meter kit and supplies, Check BS TID and PRN dx.E11.9, Disp: 1 each, Rfl: 1 .  cholecalciferol (VITAMIN D) 1000 UNITS tablet, Take 1,000 Units by mouth daily., Disp: , Rfl:  .  escitalopram (LEXAPRO) 10 MG tablet, TAKE ONE (1) TABLET EACH DAY, Disp: 90 tablet, Rfl: 0 .  hydrochlorothiazide (HYDRODIURIL) 25 MG tablet, Take 1 tablet (25 mg total) by mouth daily., Disp: 90 tablet, Rfl: 0 .  JANUVIA 100 MG tablet, TAKE ONE (1) TABLET EACH DAY, Disp: 90 tablet, Rfl: 0 .  lisinopril (PRINIVIL,ZESTRIL) 40 MG tablet, TAKE ONE (1) TABLET EACH DAY, Disp: 90 tablet, Rfl: 1 .  meloxicam (MOBIC) 7.5 MG tablet, TAKE ONE TABLET BY MOUTH TWICE DAILY, Disp: 180 tablet, Rfl: 0 .  metFORMIN (GLUCOPHAGE) 1000 MG tablet, TAKE ONE TABLET TWICE A DAY WITH FOOD, Disp: 180 tablet, Rfl: 0 .  montelukast (SINGULAIR) 10 MG tablet, TAKE ONE TABLET DAILY AT BEDTIME, Disp: 90 tablet, Rfl: 3 .  pravastatin (PRAVACHOL) 40 MG tablet, TAKE ONE (1) TABLET EACH DAY, Disp: 90 tablet, Rfl: 1 .  ranitidine (ZANTAC) 150 MG tablet, TAKE ONE TABLET BY MOUTH TWICE DAILY, Disp: 180 tablet, Rfl: 1 .  tadalafil (CIALIS) 5 MG tablet, Take 1 tablet (5 mg total) by mouth daily as needed for erectile dysfunction., Disp: 10 tablet, Rfl: 2 Social History   Socioeconomic History  . Marital status: Married    Spouse name: Not on  file  . Number of children: 4  . Years of education: Not on file  . Highest education level: Not on file  Occupational History  . Not on file  Social Needs  . Financial resource strain: Not on file  . Food insecurity:    Worry: Not on file    Inability: Not on file  . Transportation needs:    Medical: Not on file    Non-medical: Not on file  Tobacco Use  . Smoking status: Former Smoker    Packs/day: 0.50    Years: 60.00    Pack years: 30.00    Types: Cigarettes  . Smokeless tobacco: Never Used  . Tobacco  comment: Smoking one cig per day now.   Substance and Sexual Activity  . Alcohol use: No    Alcohol/week: 0.0 standard drinks  . Drug use: No  . Sexual activity: Not on file  Lifestyle  . Physical activity:    Days per week: Not on file    Minutes per session: Not on file  . Stress: Not on file  Relationships  . Social connections:    Talks on phone: Not on file    Gets together: Not on file    Attends religious service: Not on file    Active member of club or organization: Not on file    Attends meetings of clubs or organizations: Not on file    Relationship status: Not on file  . Intimate partner violence:    Fear of current or ex partner: Not on file    Emotionally abused: Not on file    Physically abused: Not on file    Forced sexual activity: Not on file  Other Topics Concern  . Not on file  Social History Narrative   Wife of almost 55 years died in 11/17/23.     Family History  Problem Relation Age of Onset  . Cancer Father   . Diabetes Sister   . Stroke Brother   . Healthy Sister   . Healthy Sister     Objective: Office vital signs reviewed. BP (!) 211/86   Pulse 80   Temp 98.4 F (36.9 C) (Oral)   Ht '6\' 1"'  (1.854 m)   Wt 210 lb (95.3 kg)   BMI 27.71 kg/m   Physical Examination:  General: Awake, alert, appears older than stated age, smells of tobacco smoke, No acute distress HEENT: MMM, sclera white Cardio: regular rate and rhythm, S1S2 heard, 3/6 murmur appreciated along the left sternal border. Pulm: clear to auscultation bilaterally, no wheezes, rhonchi or rales; normal work of breathing on room air Ext: No edema; warm.  Assessment/ Plan: 68 y.o. male   1. Type 2 diabetes mellitus with diabetic neuropathy, without long-term current use of insulin (HCC) A1c of 7.5.  Stable from previous check.  Would benefit from tighter control but we will focus on blood pressure for now.  May consider adding Jardiance versus Trulicity versus Ozempic which may also  result in improved blood pressures as well.  Smoking cessation encouraged.  Pneumococcal vaccination administered. - Bayer DCA Hb A1c Waived  2. Hypertension associated with diabetes ([place]) Uncontrolled.  Patient is asymptomatic but certainly is within stroke range for blood pressure.  This is been a consistent issue for many months now.  It sounds like he has not been taking his hydrochlorothiazide and is overdue for med refill.  Though unsure that this is going to give enough reduction in blood pressure.  I have added  a beta-blocker as well, selective given COPD.  He will follow-up in 1 week for recheck of blood pressure with the nurse.  I have placed a new referral to his cardiologist given his aortic stenosis that has not been followed up since 2017.  We will also consider adding a referral to nephrology if we are unable to get good control of blood pressure.  Perhaps Dr. Percival Spanish may be able to weigh in on other options for blood pressure control. - Basic Metabolic Panel - Ambulatory referral to Cardiology  3. Hyperlipidemia associated with type 2 diabetes mellitus ([place]) Continue statin  4. Nonrheumatic aortic valve stenosis Referral placed. - Ambulatory referral to Cardiology     Orders Placed This Encounter  Procedures  . Pneumococcal polysaccharide vaccine 23-valent greater than or equal to 2yo subcutaneous/IM  . Bayer DCA Hb A1c Waived  . Basic Metabolic Panel  . Ambulatory referral to Cardiology    Referral Priority:   Routine    Referral Type:   Consultation    Referral Reason:   Specialty Services Required    Requested Specialty:   Cardiology    Number of Visits Requested:   1   Meds ordered this encounter  Medications  . metoprolol tartrate (LOPRESSOR) 25 MG tablet    Sig: Take 1 tablet (25 mg total) by mouth 2 (two) times daily.    Dispense:  60 tablet    Refill:  [place], DO [place] [phone]

## 2019-01-04 NOTE — Patient Instructions (Addendum)
Your sugar A1c was 7.5 today.  Have your eye doctor send me your diabetic eye exam results  You got your pneumonia vaccine today  Your goal blood pressure is less than 140/90.  You were way too high and really in the stroke range for blood pressure.  I want you to come back in 1 week for blood pressure recheck with the nurse.  I am checking your kidney function today.  You are OVERDUE for a visit with Dr Percival Spanish, your cardiologist.  Schedule this as soon as possible.  I am sending a referral to have you get checked up with him again for your heart murmur.  You should be taking the following for your blood pressure:  Amlodipine 10 mg once a day  Lisinopril 40 mg once a day  Hydrochlorothiazide 25 mg once a day  I have added Metoprolol to your blood pressure regimen.  See me in 1 month for blood pressure check.

## 2019-01-05 LAB — BASIC METABOLIC PANEL
BUN/Creatinine Ratio: 13 (ref 10–24)
BUN: 11 mg/dL (ref 8–27)
CALCIUM: 9.5 mg/dL (ref 8.6–10.2)
CO2: 21 mmol/L (ref 20–29)
Chloride: 102 mmol/L (ref 96–106)
Creatinine, Ser: 0.86 mg/dL (ref 0.76–1.27)
GFR calc Af Amer: 103 mL/min/{1.73_m2} (ref >59)
GFR calc non Af Amer: 89 mL/min/{1.73_m2} (ref >59)
Glucose: 152 mg/dL — ABNORMAL HIGH (ref 65–99)
Potassium: 4.6 mmol/L (ref 3.5–5.2)
Sodium: 140 mmol/L (ref 134–144)

## 2019-01-11 ENCOUNTER — Ambulatory Visit: Payer: Medicare Other | Admitting: *Deleted

## 2019-01-11 VITALS — BP 160/67 | HR 72

## 2019-01-11 DIAGNOSIS — Z0130 Encounter for examination of blood pressure without abnormal findings: Secondary | ICD-10-CM

## 2019-01-11 NOTE — Progress Notes (Signed)
Pt here for BP check BP 179 70 P 76   rck  BP 160 67 P 72  Pt reports taking all BP medications, HCTZ 25mg , Lisinopril 40mg , Amlodipine 10mg , Metoprolol 25mg  BID

## 2019-01-11 NOTE — Progress Notes (Signed)
These BPs are looking better than previous but still above goal.  Please have him follow up with me in office in the next 2-4 weeks for BP management.

## 2019-01-28 ENCOUNTER — Ambulatory Visit (INDEPENDENT_AMBULATORY_CARE_PROVIDER_SITE_OTHER): Payer: Medicare Other | Admitting: *Deleted

## 2019-01-28 VITALS — BP 139/65 | HR 66 | Ht 73.0 in | Wt 208.8 lb

## 2019-01-28 DIAGNOSIS — Z0000 Encounter for general adult medical examination without abnormal findings: Secondary | ICD-10-CM | POA: Diagnosis not present

## 2019-01-28 NOTE — Patient Instructions (Addendum)
Omar Todd , Thank you for taking time to come for your Medicare Wellness Visit. I appreciate your ongoing commitment to your health goals. Please review the following plan we discussed and let me know if I can assist you in the future.   These are the goals we discussed: Goals    . Exercise 3x per week (30 min per time)     Try to exercise for at least 30 minutes, 3 times weekly Walking Stationary bike Hand weights     . Have 3 meals a day     Eat 3 healthy meals daily that consist of lean proteins, fruits and vegetables Decrease carbohydrates Increase water intake        This is a list of the screening recommended for you and due dates:  Health Maintenance  Topic Date Due  .  Hepatitis C: One time screening is recommended by Center for Disease Control  (CDC) for  adults born from [birth year] through 1966.   04/05/2019*  . Colon Cancer Screening  01/28/2020*  . Eye exam for diabetics  06/27/2019  . Hemoglobin A1C  07/05/2019  . Complete foot exam   08/09/2019  . Tetanus Vaccine  11/05/2020  . Flu Shot  Completed  . Pneumonia vaccines  Completed  *Topic was postponed. The date shown is not the original due date.     Healthy Eating Following a healthy eating pattern may help you to achieve and maintain a healthy body weight, reduce the risk of chronic disease, and live a long and productive life. It is important to follow a healthy eating pattern at an appropriate calorie level for your body. Your nutritional needs should be met primarily through food by choosing a variety of nutrient-rich foods. What are tips for following this plan? Reading food labels  Read labels and choose the following: ? Reduced or low sodium. ? Juices with 100% fruit juice. ? Foods with low saturated fats and high polyunsaturated and monounsaturated fats. ? Foods with whole grains, such as whole wheat, cracked wheat, brown rice, and wild rice. ? Whole grains that are fortified with folic acid. This is  recommended for women who are pregnant or who want to become pregnant.  Read labels and avoid the following: ? Foods with a lot of added sugars. These include foods that contain brown sugar, corn sweetener, corn syrup, dextrose, fructose, glucose, high-fructose corn syrup, honey, invert sugar, lactose, malt syrup, maltose, molasses, raw sugar, sucrose, trehalose, or turbinado sugar.  Do not eat more than the following amounts of added sugar per day:  6 teaspoons (25 g) for women.  9 teaspoons (38 g) for men. ? Foods that contain processed or refined starches and grains. ? Refined grain products, such as white flour, degermed cornmeal, white bread, and white rice. Shopping  Choose nutrient-rich snacks, such as vegetables, whole fruits, and nuts. Avoid high-calorie and high-sugar snacks, such as potato chips, fruit snacks, and candy.  Use oil-based dressings and spreads on foods instead of solid fats such as butter, stick margarine, or cream cheese.  Limit pre-made sauces, mixes, and "instant" products such as flavored rice, instant noodles, and ready-made pasta.  Try more plant-protein sources, such as tofu, tempeh, black beans, edamame, lentils, nuts, and seeds.  Explore eating plans such as the Mediterranean diet or vegetarian diet. Cooking  Use oil to saut or stir-fry foods instead of solid fats such as butter, stick margarine, or lard.  Try baking, boiling, grilling, or broiling instead of frying.  Remove the fatty part of meats before cooking.  Steam vegetables in water or broth. Meal planning   At meals, imagine dividing your plate into fourths: ? One-half of your plate is fruits and vegetables. ? One-fourth of your plate is whole grains. ? One-fourth of your plate is protein, especially lean meats, poultry, eggs, tofu, beans, or nuts.  Include low-fat dairy as part of your daily diet. Lifestyle  Choose healthy options in all settings, including home, work, school,  restaurants, or stores.  Prepare your food safely: ? Wash your hands after handling raw meats. ? Keep food preparation surfaces clean by regularly washing with hot, soapy water. ? Keep raw meats separate from ready-to-eat foods, such as fruits and vegetables. ? Cook seafood, meat, poultry, and eggs to the recommended internal temperature. ? Store foods at safe temperatures. In general:  Keep cold foods at 80F (4.4C) or below.  Keep hot foods at 180F (60C) or above.  Keep your freezer at [facility] (-17.8C) or below.  Foods are no longer safe to eat when they have been between the temperatures of 40-180F (4.4-60C) for more than 2 hours. What foods should I eat? Fruits Aim to eat 2 cup-equivalents of fresh, canned (in natural juice), or frozen fruits each day. Examples of 1 cup-equivalent of fruit include 1 small apple, 8 large strawberries, 1 cup canned fruit,  cup dried fruit, or 1 cup 100% juice. Vegetables Aim to eat 2-3 cup-equivalents of fresh and frozen vegetables each day, including different varieties and colors. Examples of 1 cup-equivalent of vegetables include 2 medium carrots, 2 cups raw, leafy greens, 1 cup chopped vegetable (raw or cooked), or 1 medium baked potato. Grains Aim to eat 6 ounce-equivalents of whole grains each day. Examples of 1 ounce-equivalent of grains include 1 slice of bread, 1 cup ready-to-eat cereal, 3 cups popcorn, or  cup cooked rice, pasta, or cereal. Meats and other proteins Aim to eat 5-6 ounce-equivalents of protein each day. Examples of 1 ounce-equivalent of protein include 1 egg, 1/2 cup nuts or seeds, or 1 tablespoon (16 g) peanut butter. A cut of meat or fish that is the size of a deck of cards is about 3-4 ounce-equivalents.  Of the protein you eat each week, try to have at least 8 ounces come from seafood. This includes salmon, trout, herring, and anchovies. Dairy Aim to eat 3 cup-equivalents of fat-free or low-fat dairy each day. Examples  of 1 cup-equivalent of dairy include 1 cup (240 mL) milk, 8 ounces (250 g) yogurt, 1 ounces (44 g) natural cheese, or 1 cup (240 mL) fortified soy milk. Fats and oils  Aim for about 5 teaspoons (21 g) per day. Choose monounsaturated fats, such as canola and olive oils, avocados, peanut butter, and most nuts, or polyunsaturated fats, such as sunflower, corn, and soybean oils, walnuts, pine nuts, sesame seeds, sunflower seeds, and flaxseed. Beverages  Aim for six 8-oz glasses of water per day. Limit coffee to three to five 8-oz cups per day.  Limit caffeinated beverages that have added calories, such as soda and energy drinks.  Limit alcohol intake to no more than 1 drink a day for nonpregnant women and 2 drinks a day for men. One drink equals 12 oz of beer (355 mL), 5 oz of wine (148 mL), or 1 oz of hard liquor (44 mL). Seasoning and other foods  Avoid adding excess amounts of salt to your foods. Try flavoring foods with herbs and spices instead of salt.  Avoid adding sugar to foods.  Try using oil-based dressings, sauces, and spreads instead of solid fats. This information is based on general [place] nutrition guidelines. For more information, visit Build[employer].es. Exact amounts may vary based on your nutrition needs. Summary  A healthy eating plan may help you to maintain a healthy weight, reduce the risk of chronic diseases, and stay active throughout your life.  Plan your meals. Make sure you eat the right portions of a variety of nutrient-rich foods.  Try baking, boiling, grilling, or broiling instead of frying.  Choose healthy options in all settings, including home, work, school, restaurants, or stores. This information is not intended to replace advice given to you by your health care provider. Make sure you discuss any questions you have with your health care provider. Document Released: 02/19/2018 Document Revised: 02/19/2018 Document Reviewed: 02/19/2018 Elsevier Interactive  Patient Education  2019 [employer].  Hypertension Hypertension, commonly called high blood pressure, is when the force of blood pumping through the arteries is too strong. The arteries are the blood vessels that carry blood from the heart throughout the body. Hypertension forces the heart to work harder to pump blood and may cause arteries to become narrow or stiff. Having untreated or uncontrolled hypertension can cause heart attacks, strokes, kidney disease, and other problems. A blood pressure reading consists of a higher number over a lower number. Ideally, your blood pressure should be below 120/80. The first ("top") number is called the systolic pressure. It is a measure of the pressure in your arteries as your heart beats. The second ("bottom") number is called the diastolic pressure. It is a measure of the pressure in your arteries as the heart relaxes. What are the causes? The cause of this condition is not known. What increases the risk? Some risk factors for high blood pressure are under your control. Others are not. Factors you can change  Smoking.  Having type 2 diabetes mellitus, high cholesterol, or both.  Not getting enough exercise or physical activity.  Being overweight.  Having too much fat, sugar, calories, or salt (sodium) in your diet.  Drinking too much alcohol. Factors that are difficult or impossible to change  Having chronic kidney disease.  Having a family history of high blood pressure.  Age. Risk increases with age.  Race. You may be at higher risk if you are African-American.  Gender. Men are at higher risk than women before age 81. After age 57, women are at higher risk than men.  Having obstructive sleep apnea.  Stress. What are the signs or symptoms? Extremely high blood pressure (hypertensive crisis) may cause:  Headache.  Anxiety.  Shortness of breath.  Nosebleed.  Nausea and vomiting.  Severe chest pain.  Jerky movements you  cannot control (seizures). How is this diagnosed? This condition is diagnosed by measuring your blood pressure while you are seated, with your arm resting on a surface. The cuff of the blood pressure monitor will be placed directly against the skin of your upper arm at the level of your heart. It should be measured at least twice using the same arm. Certain conditions can cause a difference in blood pressure between your right and left arms. Certain factors can cause blood pressure readings to be lower or higher than normal (elevated) for a short period of time:  When your blood pressure is higher when you are in a health care provider's office than when you are at home, this is called white coat hypertension. Most people  with this condition do not need medicines.  When your blood pressure is higher at home than when you are in a health care provider's office, this is called masked hypertension. Most people with this condition may need medicines to control blood pressure. If you have a high blood pressure reading during one visit or you have normal blood pressure with other risk factors:  You may be asked to return on a different day to have your blood pressure checked again.  You may be asked to monitor your blood pressure at home for 1 week or longer. If you are diagnosed with hypertension, you may have other blood or imaging tests to help your health care provider understand your overall risk for other conditions. How is this treated? This condition is treated by making healthy lifestyle changes, such as eating healthy foods, exercising more, and reducing your alcohol intake. Your health care provider may prescribe medicine if lifestyle changes are not enough to get your blood pressure under control, and if:  Your systolic blood pressure is above 130.  Your diastolic blood pressure is above 80. Your personal target blood pressure may vary depending on your medical conditions, your age, and  other factors. Follow these instructions at home: Eating and drinking   Eat a diet that is high in fiber and potassium, and low in sodium, added sugar, and fat. An example eating plan is called the DASH (Dietary Approaches to Stop Hypertension) diet. To eat this way: ? Eat plenty of fresh fruits and vegetables. Try to fill half of your plate at each meal with fruits and vegetables. ? Eat whole grains, such as whole wheat pasta, brown rice, or whole grain bread. Fill about one quarter of your plate with whole grains. ? Eat or drink low-fat dairy products, such as skim milk or low-fat yogurt. ? Avoid fatty cuts of meat, processed or cured meats, and poultry with skin. Fill about one quarter of your plate with lean proteins, such as fish, chicken without skin, beans, eggs, and tofu. ? Avoid premade and processed foods. These tend to be higher in sodium, added sugar, and fat.  Reduce your daily sodium intake. Most people with hypertension should eat less than 1,500 mg of sodium a day.  Limit alcohol intake to no more than 1 drink a day for nonpregnant women and 2 drinks a day for men. One drink equals 12 oz of beer, 5 oz of wine, or 1 oz of hard liquor. Lifestyle   Work with your health care provider to maintain a healthy body weight or to lose weight. Ask what an ideal weight is for you.  Get at least 30 minutes of exercise that causes your heart to beat faster (aerobic exercise) most days of the week. Activities may include walking, swimming, or biking.  Include exercise to strengthen your muscles (resistance exercise), such as pilates or lifting weights, as part of your weekly exercise routine. Try to do these types of exercises for 30 minutes at least 3 days a week.  Do not use any products that contain nicotine or tobacco, such as cigarettes and e-cigarettes. If you need help quitting, ask your health care provider.  Monitor your blood pressure at home as told by your health care  provider.  Keep all follow-up visits as told by your health care provider. This is important. Medicines  Take over-the-counter and prescription medicines only as told by your health care provider. Follow directions carefully. Blood pressure medicines must be taken as prescribed.  Do not skip doses of blood pressure medicine. Doing this puts you at risk for problems and can make the medicine less effective.  Ask your health care provider about side effects or reactions to medicines that you should watch for. Contact a health care provider if:  You think you are having a reaction to a medicine you are taking.  You have headaches that keep coming back (recurring).  You feel dizzy.  You have swelling in your ankles.  You have trouble with your vision. Get help right away if:  You develop a severe headache or confusion.  You have unusual weakness or numbness.  You feel faint.  You have severe pain in your chest or abdomen.  You vomit repeatedly.  You have trouble breathing. Summary  Hypertension is when the force of blood pumping through your arteries is too strong. If this condition is not controlled, it may put you at risk for serious complications.  Your personal target blood pressure may vary depending on your medical conditions, your age, and other factors. For most people, a normal blood pressure is less than 120/80.  Hypertension is treated with lifestyle changes, medicines, or a combination of both. Lifestyle changes include weight loss, eating a healthy, low-sodium diet, exercising more, and limiting alcohol. This information is not intended to replace advice given to you by your health care provider. Make sure you discuss any questions you have with your health care provider. Document Released: 11/07/2005 Document Revised: 10/05/2016 Document Reviewed: 10/05/2016 Elsevier Interactive Patient Education  2019 [place] Directive  Advance directives are  legal documents that let you make choices ahead of time about your health care and medical treatment in case you become unable to communicate for yourself. Advance directives are a way for you to communicate your wishes to family, friends, and health care providers. This can help convey your decisions about end-of-life care if you become unable to communicate. Discussing and writing advance directives should happen over time rather than all at once. Advance directives can be changed depending on your situation and what you want, even after you have signed the advance directives. If you do not have an advance directive, some states assign family decision makers to act on your behalf based on how closely you are related to them. Each state has its own laws regarding advance directives. You may want to check with your health care provider, attorney, or state representative about the laws in your state. There are different types of advance directives, such as:  Medical power of attorney.  Living will.  Do not resuscitate (DNR) or do not attempt resuscitation (DNAR) order. Health care proxy and medical power of attorney A health care proxy, also called a health care agent, is a person who is appointed to make medical decisions for you in cases in which you are unable to make the decisions yourself. Generally, people choose someone they know well and trust to represent their preferences. Make sure to ask this person for an agreement to act as your proxy. A proxy may have to exercise judgment in the event of a medical decision for which your wishes are not known. A medical power of attorney is a legal document that names your health care proxy. Depending on the laws in your state, after the document is written, it may also need to be:  Signed.  Notarized.  Dated.  Copied.  Witnessed.  Incorporated into your medical record. You may also want to appoint someone to  manage your financial affairs in a  situation in which you are unable to do so. This is called a durable power of attorney for finances. It is a separate legal document from the durable power of attorney for health care. You may choose the same person or someone different from your health care proxy to act as your agent in financial matters. If you do not appoint a proxy, or if there is a concern that the proxy is not acting in your best interests, a court-appointed guardian may be designated to act on your behalf. Living will A living will is a set of instructions documenting your wishes about medical care when you cannot express them yourself. Health care providers should keep a copy of your living will in your medical record. You may want to give a copy to family members or friends. To alert caregivers in case of an emergency, you can place a card in your wallet to let them know that you have a living will and where they can find it. A living will is used if you become:  Terminally ill.  Incapacitated.  Unable to communicate or make decisions. Items to consider in your living will include:  The use or non-use of life-sustaining equipment, such as dialysis machines and breathing machines (ventilators).  A DNR or DNAR order, which is the instruction not to use cardiopulmonary resuscitation (CPR) if breathing or heartbeat stops.  The use or non-use of tube feeding.  Withholding of food and fluids.  Comfort (palliative) care when the goal becomes comfort rather than a cure.  Organ and tissue donation. A living will does not give instructions for distributing your money and property if you should pass away. It is recommended that you seek the advice of a lawyer when writing a will. Decisions about taxes, beneficiaries, and asset distribution will be legally binding. This process can relieve your family and friends of any concerns surrounding disputes or questions that may come up about the distribution of your assets. DNR or  DNAR A DNR or DNAR order is a request not to have CPR in the event that your heart stops beating or you stop breathing. If a DNR or DNAR order has not been made and shared, a health care provider will try to help any patient whose heart has stopped or who has stopped breathing. If you plan to have surgery, talk with your health care provider about how your DNR or DNAR order will be followed if problems occur. Summary  Advance directives are the legal documents that allow you to make choices ahead of time about your health care and medical treatment in case you become unable to communicate for yourself.  The process of discussing and writing advance directives should happen over time. You can change the advance directives, even after you have signed them.  Advance directives include DNR or DNAR orders, living wills, and designating an agent as your medical power of attorney. This information is not intended to replace advice given to you by your health care provider. Make sure you discuss any questions you have with your health care provider. Document Released: 02/14/2008 Document Revised: 09/26/2016 Document Reviewed: 09/26/2016 Elsevier Interactive Patient Education  2019 [employer].  Steps to Quit Smoking  Smoking tobacco can be bad for your health. It can also affect almost every organ in your body. Smoking puts you and people around you at risk for many serious long-lasting (chronic) diseases. Quitting smoking is hard, but it is one of  the best things that you can do for your health. It is never too late to quit. What are the benefits of quitting smoking? When you quit smoking, you lower your risk for getting serious diseases and conditions. They can include:  Lung cancer or lung disease.  Heart disease.  Stroke.  Heart attack.  Not being able to have children (infertility).  Weak bones (osteoporosis) and broken bones (fractures). If you have coughing, wheezing, and shortness of  breath, those symptoms may get better when you quit. You may also get sick less often. If you are pregnant, quitting smoking can help to lower your chances of having a baby of low birth weight. What can I do to help me quit smoking? Talk with your doctor about what can help you quit smoking. Some things you can do (strategies) include:  Quitting smoking totally, instead of slowly cutting back how much you smoke over a period of time.  Going to in-person counseling. You are more likely to quit if you go to many counseling sessions.  Using resources and support systems, such as: ? Database administrator with a Social worker. ? Phone quitlines. ? Careers information officer. ? Support groups or group counseling. ? Text messaging programs. ? Mobile phone apps or applications.  Taking medicines. Some of these medicines may have nicotine in them. If you are pregnant or breastfeeding, do not take any medicines to quit smoking unless your doctor says it is okay. Talk with your doctor about counseling or other things that can help you. Talk with your doctor about using more than one strategy at the same time, such as taking medicines while you are also going to in-person counseling. This can help make quitting easier. What things can I do to make it easier to quit? Quitting smoking might feel very hard at first, but there is a lot that you can do to make it easier. Take these steps:  Talk to your family and friends. Ask them to support and encourage you.  Call phone quitlines, reach out to support groups, or work with a Social worker.  Ask people who smoke to not smoke around you.  Avoid places that make you want (trigger) to smoke, such as: ? Bars. ? Parties. ? Smoke-break areas at work.  Spend time with people who do not smoke.  Lower the stress in your life. Stress can make you want to smoke. Try these things to help your stress: ? Getting regular exercise. ? Deep-breathing  exercises. ? Yoga. ? Meditating. ? Doing a body scan. To do this, close your eyes, focus on one area of your body at a time from head to toe, and notice which parts of your body are tense. Try to relax the muscles in those areas.  Download or buy apps on your mobile phone or tablet that can help you stick to your quit plan. There are many free apps, such as QuitGuide from the [employer] manager for Disease Control and Prevention). You can find more support from smokefree.gov and other websites. This information is not intended to replace advice given to you by your health care provider. Make sure you discuss any questions you have with your health care provider. Document Released: 09/03/2009 Document Revised: 07/05/2016 Document Reviewed: 03/24/2015 Elsevier Interactive Patient Education  2019 [employer].

## 2019-01-28 NOTE — Progress Notes (Signed)
Subjective:   Omar Todd is a 68 y.o. male who presents for Medicare Annual/Subsequent preventive examination.  Omar Todd is retired from [employer] of 31 years.  He enjoys working on cars and walking. He lives at home with his wife Omar Todd) of 2 years.  He also has 3 children that he sees often.  He eat 3 unhealthy meals daily that consist of hamburger, fries, biscuits, gravy and bacon and sausage.  Omar Todd walks for at least 30 minutes daily. He has had no hospitalizations or surgeries in the past year.  Overall Omar Todd feels that his health is about the same as it was a year ago.   Objective:    Vitals: BP 139/65   Pulse 66   Ht '6\' 1"'  (1.854 m)   Wt 208 lb 12.8 oz (94.7 kg)   BMI 27.55 kg/m   Body mass index is 27.55 kg/m.  No flowsheet data found.  Tobacco Social History   Tobacco Use  Smoking Status Current Every Day Smoker  . Packs/day: 0.50  . Years: 60.00  . Pack years: 30.00  . Types: Cigarettes  Smokeless Tobacco Never Used     Ready to quit: Yes Counseling given: Yes   Clinical Intake:  Pre-visit preparation completed: Yes  Pain : No/denies pain     BMI - recorded: 27.55 Nutritional Status: BMI 25 -29 Overweight Nutritional Risks: None Diabetes: Yes CBG done?: No(130 at home this morning) Did pt. bring in CBG monitor from home?: No  How often do you need to have someone help you when you read instructions, pamphlets, or other written materials from your doctor or pharmacy?: 4 - Often What is the last grade level you completed in school?: 9th Grade  Interpreter Needed?: No  Information entered by :: Truett Mainland, LPN  Past Medical History:  Diagnosis Date  . COPD (chronic obstructive pulmonary disease) ([place])   . Diabetes mellitus without complication ([place])   . GERD (gastroesophageal reflux disease)   . Hyperlipidemia   . Hypertension    Past Surgical History:  Procedure Laterality Date  . Thumb surgery     Family  History  Problem Relation Age of Onset  . Cancer Father   . Diabetes Sister   . Stroke Brother   . Healthy Sister   . Healthy Sister    Social History   Socioeconomic History  . Marital status: Married    Spouse name: Omar Todd  . Number of children: 3  . Years of education: 9th Grade  . Highest education level: 9th grade  Occupational History  . Occupation: Retired    Comment: Science writer  Social Needs  . Financial resource strain: Not hard at all  . Food insecurity:    Worry: Never true    Inability: Never true  . Transportation needs:    Medical: No    Non-medical: No  Tobacco Use  . Smoking status: Current Every Day Smoker    Packs/day: 0.50    Years: 60.00    Pack years: 30.00    Types: Cigarettes  . Smokeless tobacco: Never Used  Substance and Sexual Activity  . Alcohol use: No    Alcohol/week: 0.0 standard drinks  . Drug use: No  . Sexual activity: Yes  Lifestyle  . Physical activity:    Days per week: 7 days    Minutes per session: 60 min  . Stress: Not at all  Relationships  . Social connections:  Talks on phone: More than three times a week    Gets together: Once a week    Attends religious service: Never    Active member of club or organization: No    Attends meetings of clubs or organizations: Never    Relationship status: Married  Other Topics Concern  . Not on file  Social History Narrative   Wife of almost 11 years died in 11/07/23.      Outpatient Encounter Medications as of 01/28/2019  Medication Sig  . ACCU-CHEK FASTCLIX LANCETS MISC CHECK BLOOD SUGAR 3 TIMES DAILY AND AS NEEDED  . ACCU-CHEK GUIDE test strip 3 TIMES DAILY  . amLODipine (NORVASC) 10 MG tablet TAKE ONE (1) TABLET EACH DAY  . aspirin EC 81 MG tablet Take 81 mg by mouth daily.  Marland Kitchen BIOTIN 5000 PO Take 1 capsule by mouth daily.  . blood glucose meter kit and supplies Check BS TID and PRN dx.E11.9  . cholecalciferol (VITAMIN D) 1000 UNITS tablet Take 1,000 Units by  mouth daily.  Marland Kitchen escitalopram (LEXAPRO) 10 MG tablet TAKE ONE (1) TABLET EACH DAY  . hydrochlorothiazide (HYDRODIURIL) 25 MG tablet TAKE ONE (1) TABLET EACH DAY  . JANUVIA 100 MG tablet TAKE ONE (1) TABLET EACH DAY  . lisinopril (PRINIVIL,ZESTRIL) 40 MG tablet TAKE ONE (1) TABLET EACH DAY  . meloxicam (MOBIC) 7.5 MG tablet TAKE ONE TABLET BY MOUTH TWICE DAILY  . metFORMIN (GLUCOPHAGE) 1000 MG tablet TAKE ONE TABLET TWICE A DAY WITH FOOD  . metoprolol tartrate (LOPRESSOR) 25 MG tablet Take 1 tablet (25 mg total) by mouth 2 (two) times daily.  . montelukast (SINGULAIR) 10 MG tablet TAKE ONE TABLET DAILY AT BEDTIME  . pravastatin (PRAVACHOL) 40 MG tablet TAKE ONE (1) TABLET EACH DAY  . tadalafil (CIALIS) 5 MG tablet Take 1 tablet (5 mg total) by mouth daily as needed for erectile dysfunction.  . [DISCONTINUED] ranitidine (ZANTAC) 150 MG tablet TAKE ONE TABLET BY MOUTH TWICE DAILY   No facility-administered encounter medications on file as of 01/28/2019.     Activities of Daily Living In your present state of health, do you have any difficulty performing the following activities: 01/28/2019  Hearing? N  Vision? N  Difficulty concentrating or making decisions? N  Walking or climbing stairs? N  Dressing or bathing? N  Doing errands, shopping? N  Some recent data might be hidden    Patient Care Team: [name], DO as PCP - General (Family Medicine)   Assessment:   This is a routine wellness examination for Omar Todd.  Exercise Activities and Dietary recommendations Current Exercise Habits: Home exercise routine, Type of exercise: walking, Time (Minutes): 60, Frequency (Times/Week): 7, Weekly Exercise (Minutes/Week): 420, Intensity: Mild, Exercise limited by: None identified  Goals    . Exercise 3x per week (30 min per time)     Try to exercise for at least 30 minutes, 3 times weekly Walking Stationary bike Hand weights     . Have 3 meals a day     Eat 3 healthy meals daily that  consist of lean proteins, fruits and vegetables Decrease carbohydrates Increase water intake        Fall Risk Fall Risk  01/28/2019 01/04/2019 08/29/2018 08/08/2018 04/04/2018  Falls in the past year? 0 0 No No No   Is the patient's home free of loose throw rugs in walkways, pet beds, electrical cords, etc?   yes      Grab bars in the bathroom? yes  Handrails on the stairs?   yes      Adequate lighting?   yes  Timed Get Up and Go Performed:   Depression Screen PHQ 2/9 Scores 01/28/2019 01/04/2019 08/29/2018 08/08/2018  PHQ - 2 Score 0 0 1 1  PHQ- 9 Score - 0 - -    Cognitive Function MMSE - Mini Mental State Exam 01/28/2019  Not completed: (No Data)  Orientation to time 5  Orientation to Place 3  Registration 3  Attention/ Calculation 0  Recall 2  Language- name 2 objects 2  Language- repeat 0  Language- follow 3 step command 3  Language- read & follow direction 0  Write a sentence 0  Copy design 0  Total score 18  Has trouble reading and writing due to lack of education.       Immunization History  Administered Date(s) Administered  . Influenza, High Dose Seasonal PF 09/21/2017, 08/29/2018  . Influenza,inj,Quad PF,6+ Mos 08/21/2013, 08/25/2014, 08/31/2015, 09/14/2016  . Pneumococcal Conjugate-13 02/12/2016  . Pneumococcal Polysaccharide-23 11/29/2013, 01/04/2019    Qualifies for Shingles Vaccine? Patient Declined   Screening Tests Health Maintenance  Topic Date Due  . Hepatitis C Screening  04/05/2019 (Originally 04/20/1951)  . COLONOSCOPY  01/28/2020 (Originally 11/12/2000)  . OPHTHALMOLOGY EXAM  06/27/2019  . HEMOGLOBIN A1C  07/05/2019  . FOOT EXAM  08/09/2019  . TETANUS/TDAP  11/05/2020  . INFLUENZA VACCINE  Completed  . PNA vac Low Risk Adult  Completed  Patient declined colonoscopy.  Cancer Screenings: Lung: Low Dose CT Chest recommended if Age 57-80 years, 30 pack-year currently smoking OR have quit w/in 15years. Patient does qualify. Colorectal:  Declined  Additional Screenings: Hepatitis C Screening:      Plan:   Encouraged Mr. Norem to try to eat 3 healthy meals with snacks in between that consist of lean proteins, fresh fruits and fresh vegetables.  Encouraged to continue to exercise for at least 30 minutes, 3 times weekly.  Encouraged to look over smoking cessation information given and to work on quitting smoking.  Encouraged to look over and discuss advance directive packet with family.  Explained the importance of colon cancer screenings and encouraged to discuss with PCP at next office visit.  Encouraged to keep follow up appointment with Dr. Lajuana Ripple on 04/05/2019.  I have personally reviewed and noted the following in the patient's chart:   . Medical and social history . Use of alcohol, tobacco or illicit drugs  . Current medications and supplements . Functional ability and status . Nutritional status . Physical activity . Advanced directives . List of other physicians . Hospitalizations, surgeries, and ER visits in previous 12 months . Vitals . Screenings to include cognitive, depression, and falls . Referrals and appointments  In addition, I have reviewed and discussed with patient certain preventive protocols, quality metrics, and best practice recommendations. A written personalized care plan for preventive services as well as general preventive health recommendations were provided to patient.     Wardell Heath, LPN  02/21/[id number]

## 2019-02-01 ENCOUNTER — Telehealth: Payer: Self-pay | Admitting: *Deleted

## 2019-02-01 NOTE — Telephone Encounter (Signed)
-----   Message from [name], DO sent at 01/11/2019  1:02 PM EST -----   ----- Message ----- From: Marin Olp Sent: 01/11/2019   9:31 AM EST To: Chipper Herb, MD, [name], DO

## 2019-02-07 ENCOUNTER — Other Ambulatory Visit: Payer: Self-pay | Admitting: Family Medicine

## 2019-04-04 ENCOUNTER — Other Ambulatory Visit: Payer: Self-pay | Admitting: Family Medicine

## 2019-04-05 ENCOUNTER — Encounter: Payer: Self-pay | Admitting: Family Medicine

## 2019-04-05 ENCOUNTER — Ambulatory Visit (INDEPENDENT_AMBULATORY_CARE_PROVIDER_SITE_OTHER): Payer: Medicare Other | Admitting: Family Medicine

## 2019-04-05 ENCOUNTER — Other Ambulatory Visit: Payer: Self-pay

## 2019-04-05 ENCOUNTER — Telehealth: Payer: Self-pay | Admitting: Family Medicine

## 2019-04-05 VITALS — BP 190/89 | HR 81 | Temp 98.0°F | Ht 73.0 in | Wt 209.0 lb

## 2019-04-05 DIAGNOSIS — E1140 Type 2 diabetes mellitus with diabetic neuropathy, unspecified: Secondary | ICD-10-CM

## 2019-04-05 DIAGNOSIS — I10 Essential (primary) hypertension: Secondary | ICD-10-CM | POA: Diagnosis not present

## 2019-04-05 DIAGNOSIS — E1169 Type 2 diabetes mellitus with other specified complication: Secondary | ICD-10-CM

## 2019-04-05 DIAGNOSIS — I6523 Occlusion and stenosis of bilateral carotid arteries: Secondary | ICD-10-CM | POA: Diagnosis not present

## 2019-04-05 DIAGNOSIS — E785 Hyperlipidemia, unspecified: Secondary | ICD-10-CM | POA: Diagnosis not present

## 2019-04-05 LAB — BAYER DCA HB A1C WAIVED: HB A1C (BAYER DCA - WAIVED): 7.8 % — ABNORMAL HIGH (ref <7.0)

## 2019-04-05 MED ORDER — HYDROCHLOROTHIAZIDE 25 MG PO TABS: ORAL_TABLET | ORAL | 3 refills | Status: DC

## 2019-04-05 MED ORDER — ESCITALOPRAM OXALATE 10 MG PO TABS: ORAL_TABLET | ORAL | 2 refills | Status: DC

## 2019-04-05 MED ORDER — METFORMIN HCL 1000 MG PO TABS: ORAL_TABLET | ORAL | 3 refills | Status: DC

## 2019-04-05 MED ORDER — AMLODIPINE BESYLATE 10 MG PO TABS: ORAL_TABLET | ORAL | 1 refills | Status: DC

## 2019-04-05 MED ORDER — EMPAGLIFLOZIN 10 MG PO TABS: 10.0000 mg | ORAL_TABLET | Freq: Every day | ORAL | 2 refills | Status: DC

## 2019-04-05 MED ORDER — PRAVASTATIN SODIUM 40 MG PO TABS: ORAL_TABLET | ORAL | 1 refills | Status: DC

## 2019-04-05 MED ORDER — LISINOPRIL 40 MG PO TABS: ORAL_TABLET | ORAL | 1 refills | Status: DC

## 2019-04-05 MED ORDER — METOPROLOL TARTRATE 25 MG PO TABS: 25.0000 mg | ORAL_TABLET | Freq: Two times a day (BID) | ORAL | 3 refills | Status: DC

## 2019-04-05 NOTE — Telephone Encounter (Signed)
Continue to have him check BP daily and call me with readings in 2 weeks.

## 2019-04-05 NOTE — Patient Instructions (Signed)
Your blood pressure is too high here in the office.  The blood pressure readings you give me from home are normal though.  This may be a white coat syndrome finding.  I would like you to call the office with your blood pressure readings from home over the next 2 weeks.  I am adding a medication called Jardiance to help with your blood sugar and hopefully her blood pressure as well.  Please make sure that you follow-up with Dr. Percival Spanish so that he may also give input as to what we can do to improve your blood pressures if they are truly are above goal.  Your goal is less than 140/90.  Make sure that you are not eating salt and that you are exercising regularly.

## 2019-04-05 NOTE — Telephone Encounter (Signed)
PT took BP when he got home @11am  BP was 156/82 pulse 84. Needs Dr. Darnell Level to know

## 2019-04-05 NOTE — Progress Notes (Signed)
Subjective: CC: Follow-up type 2 diabetes, hyperlipidemia and HTN PCP: [name], DO Omar Todd is a 68 y.o. male presenting to clinic today for:  1.  Type 2 Diabetes w/ Hypertension and hyperlipidemia Patient reports compliance w/ Januvia 151m, Metformin 10024mBID, Pravachol 4027mHydrochlorothiazide 25 mg daily, lisinopril 40 mg daily, metoprolol 25 mg twice daily and amlodipine 10 mg daily Denies any chest pain, shortness of breath, lower extremity edema, dizziness.  He is an every day smoker.  He has hx aortic stenosis and carotid artery disease and was previously followed by Dr HocPercival SpanishHe had an appointment scheduled but notes that this had to be canceled.  Additionally, he reports that he stopped smoking last month.  He reports blood pressures at home running 120s over 50s.  Last eye exam: Up-to-date Last foot exam: 07/2018 done. Last A1c:  Lab Results  Component Value Date   HGBA1C 7.5 (H) 01/04/2019   Nephropathy screen indicated?: on ACE-I Last flu, zoster and/or pneumovax: Up-to-date Immunization History  Administered Date(s) Administered  . Influenza, High Dose Seasonal PF 09/21/2017, 08/29/2018  . Influenza,inj,Quad PF,6+ Mos 08/21/2013, 08/25/2014, 08/31/2015, 09/14/2016  . Pneumococcal Conjugate-13 02/12/2016  . Pneumococcal Polysaccharide-23 11/29/2013, 01/04/2019    ROS: Per HPI  Allergies  Allergen Reactions  . Penicillins Other (See Comments)    Pt. States he "passed out"   Past Medical History:  Diagnosis Date  . [place] . [place] complication [place] . GERD (gastroesophageal reflux disease)   . Hyperlipidemia   . Hypertension     Current Outpatient Medications:  .  ACCU-CHEK FASTCLIX LANCETS MISC, CHECK BLOOD SUGAR 3 TIMES DAILY AND AS NEEDED, Disp: 102 each, Rfl: 3 .  ACCU-CHEK GUIDE test strip, 3 TIMES DAILY, Disp: 100 each, Rfl: 11 .  amLODipine (NORVASC) 10 MG  tablet, TAKE ONE (1) TABLET EACH DAY, Disp: 90 tablet, Rfl: 1 .  aspirin EC 81 MG tablet, Take 81 mg by mouth daily., Disp: , Rfl:  .  BIOTIN 5000 PO, Take 1 capsule by mouth daily., Disp: , Rfl:  .  blood glucose meter kit and supplies, Check BS TID and PRN dx.E11.9, Disp: 1 each, Rfl: 1 .  cholecalciferol (VITAMIN D) 1000 UNITS tablet, Take 1,000 Units by mouth daily., Disp: , Rfl:  .  escitalopram (LEXAPRO) 10 MG tablet, TAKE ONE (1) TABLET EACH DAY, Disp: 90 tablet, Rfl: 0 .  hydrochlorothiazide (HYDRODIURIL) 25 MG tablet, TAKE ONE (1) TABLET EACH DAY, Disp: 90 tablet, Rfl: 0 .  JANUVIA 100 MG tablet, TAKE ONE (1) TABLET EACH DAY, Disp: 90 tablet, Rfl: 0 .  lisinopril (PRINIVIL,ZESTRIL) 40 MG tablet, TAKE ONE (1) TABLET EACH DAY, Disp: 90 tablet, Rfl: 1 .  meloxicam (MOBIC) 7.5 MG tablet, TAKE ONE TABLET BY MOUTH TWICE DAILY, Disp: 180 tablet, Rfl: 0 .  metFORMIN (GLUCOPHAGE) 1000 MG tablet, TAKE ONE TABLET TWICE A DAY WITH FOOD, Disp: 180 tablet, Rfl: 0 .  metoprolol tartrate (LOPRESSOR) 25 MG tablet, TAKE ONE TABLET BY MOUTH TWICE DAILY, Disp: 180 tablet, Rfl: 0 .  montelukast (SINGULAIR) 10 MG tablet, TAKE ONE TABLET DAILY AT BEDTIME, Disp: 90 tablet, Rfl: 0 .  pravastatin (PRAVACHOL) 40 MG tablet, TAKE ONE (1) TABLET EACH DAY, Disp: 90 tablet, Rfl: 1 Social History   Socioeconomic History  . Marital status: Married    Spouse name: RocLinwood Dibbles Number of children: 3  . Years of education: 9th Grade  .  Highest education level: 9th grade  Occupational History  . Occupation: Retired    Comment: Science writer  Social Needs  . Financial resource strain: Not hard at all  . Food insecurity:    Worry: Never true    Inability: Never true  . Transportation needs:    Medical: No    Non-medical: No  Tobacco Use  . Smoking status: Current Every Day Smoker    Packs/day: 0.50    Years: 60.00    Pack years: 30.00    Types: Cigarettes  . Smokeless tobacco: Never Used  Substance  and Sexual Activity  . Alcohol use: No    Alcohol/week: 0.0 standard drinks  . Drug use: No  . Sexual activity: Yes  Lifestyle  . Physical activity:    Days per week: 7 days    Minutes per session: 60 min  . Stress: Not at all  Relationships  . Social connections:    Talks on phone: More than three times a week    Gets together: Once a week    Attends religious service: Never    Active member of club or organization: No    Attends meetings of clubs or organizations: Never    Relationship status: Married  . Intimate partner violence:    Fear of current or ex partner: No    Emotionally abused: No    Physically abused: No    Forced sexual activity: No  Other Topics Concern  . Not on file  Social History Narrative   Wife of almost 49 years died in November 16, 2023.     Family History  Problem Relation Age of Onset  . Cancer Father   . Diabetes Sister   . Stroke Brother   . Healthy Sister   . Healthy Sister     Objective: Office vital signs reviewed. BP (!) 190/89   Pulse 81   Temp 98 F (36.7 C) (Oral)   Ht _0  (1.854 m)   Wt 209 lb (94.8 kg)   BMI 27.57 kg/m   Physical Examination:  General: Awake, alert, smells of tobacco smoke, No acute distress HEENT: MMM, sclera white Cardio: regular rate and rhythm, S1S2 heard, 3/6 murmur appreciated along bilateral sternal borders Pulm: clear to auscultation bilaterally, no wheezes, rhonchi or rales; normal work of breathing on room air Ext: No edema; warm.  Assessment/ Plan: 68 y.o. male   1. Type 2 diabetes mellitus with diabetic neuropathy, without long-term current use of insulin ([place]) Uncontrolled with A1c of 7.8 today.  I am adding Jardiance 10 mg daily in efforts to improve blood sugar and blood pressure.  Unsure of insurance will pay for this.  Patient is amenable to trying this medication.  Metformin also refilled. - Bayer DCA Hb A1c Waived - empagliflozin (JARDIANCE) 10 MG TABS tablet; Take 10 mg by mouth daily.   Dispense: 30 tablet; Refill: 2 - metFORMIN (GLUCOPHAGE) 1000 MG tablet; TAKE ONE TABLET TWICE A DAY WITH FOOD  Dispense: 180 tablet; Refill: 3  2. Uncontrolled hypertension Grossly uncontrolled but reported blood pressures at home are within normal range.  I question of whitecoat syndrome.  I will await his blood pressure recordings from home before deciding whether or not he should see a nephrologist.  Again, I have asked that he follow-up with his cardiologist as well, who perhaps can weigh in on blood pressure recommendations. - empagliflozin (JARDIANCE) 10 MG TABS tablet; Take 10 mg by mouth daily.  Dispense: 30 tablet; Refill: 2 -  amLODipine (NORVASC) 10 MG tablet; TAKE ONE (1) TABLET EACH DAY  Dispense: 90 tablet; Refill: 1 - hydrochlorothiazide (HYDRODIURIL) 25 MG tablet; TAKE ONE (1) TABLET EACH DAY  Dispense: 90 tablet; Refill: 3 - lisinopril (ZESTRIL) 40 MG tablet; TAKE ONE (1) TABLET EACH DAY  Dispense: 90 tablet; Refill: 1 - metoprolol tartrate (LOPRESSOR) 25 MG tablet; Take 1 tablet (25 mg total) by mouth 2 (two) times daily.  Dispense: 180 tablet; Refill: 3  3. Hyperlipidemia associated with type 2 diabetes mellitus ([facility]) Continue Pravachol - empagliflozin (JARDIANCE) 10 MG TABS tablet; Take 10 mg by mouth daily.  Dispense: 30 tablet; Refill: 2 - pravastatin (PRAVACHOL) 40 MG tablet; TAKE ONE (1) TABLET EACH DAY  Dispense: 90 tablet; Refill: 1  4. Bilateral carotid artery stenosis - empagliflozin (JARDIANCE) 10 MG TABS tablet; Take 10 mg by mouth daily.  Dispense: 30 tablet; Refill: 2 - pravastatin (PRAVACHOL) 40 MG tablet; TAKE ONE (1) TABLET EACH DAY  Dispense: 90 tablet; Refill: 1    Orders Placed This Encounter  Procedures  . Bayer DCA Hb A1c Waived   No orders of the defined types were placed in this encounter.    [name], DO [place] [phone]

## 2019-04-11 NOTE — Telephone Encounter (Signed)
Aware of provider's advice.  He says he developed a rash and stopped the new blood pressure medications but is sure it is doing good?  Advised him to take blood pressure reading and schedule a follow up with his pcp and bring all medicines with readings.

## 2019-04-26 ENCOUNTER — Other Ambulatory Visit: Payer: Self-pay | Admitting: Family Medicine

## 2019-05-16 ENCOUNTER — Other Ambulatory Visit: Payer: Self-pay | Admitting: Family Medicine

## 2019-06-23 ENCOUNTER — Other Ambulatory Visit: Payer: Self-pay | Admitting: Family Medicine

## 2019-07-23 ENCOUNTER — Other Ambulatory Visit: Payer: Self-pay | Admitting: Family Medicine

## 2019-08-21 ENCOUNTER — Ambulatory Visit (INDEPENDENT_AMBULATORY_CARE_PROVIDER_SITE_OTHER): Payer: Medicare Other | Admitting: Family Medicine

## 2019-08-21 ENCOUNTER — Encounter: Payer: Self-pay | Admitting: Family Medicine

## 2019-08-21 ENCOUNTER — Other Ambulatory Visit: Payer: Self-pay

## 2019-08-21 VITALS — BP 177/86 | HR 88 | Temp 98.4°F | Ht 72.0 in | Wt 216.0 lb

## 2019-08-21 DIAGNOSIS — E1165 Type 2 diabetes mellitus with hyperglycemia: Secondary | ICD-10-CM | POA: Diagnosis not present

## 2019-08-21 DIAGNOSIS — H9311 Tinnitus, right ear: Secondary | ICD-10-CM

## 2019-08-21 DIAGNOSIS — H7291 Unspecified perforation of tympanic membrane, right ear: Secondary | ICD-10-CM

## 2019-08-21 DIAGNOSIS — E1169 Type 2 diabetes mellitus with other specified complication: Secondary | ICD-10-CM | POA: Diagnosis not present

## 2019-08-21 DIAGNOSIS — Z23 Encounter for immunization: Secondary | ICD-10-CM

## 2019-08-21 DIAGNOSIS — I10 Essential (primary) hypertension: Secondary | ICD-10-CM | POA: Diagnosis not present

## 2019-08-21 DIAGNOSIS — I6523 Occlusion and stenosis of bilateral carotid arteries: Secondary | ICD-10-CM | POA: Diagnosis not present

## 2019-08-21 DIAGNOSIS — E785 Hyperlipidemia, unspecified: Secondary | ICD-10-CM

## 2019-08-21 LAB — BAYER DCA HB A1C WAIVED: HB A1C (BAYER DCA - WAIVED): 7.9 % — ABNORMAL HIGH (ref <7.0)

## 2019-08-21 MED ORDER — LISINOPRIL 40 MG PO TABS: ORAL_TABLET | ORAL | 1 refills | Status: DC

## 2019-08-21 MED ORDER — AMLODIPINE BESYLATE 10 MG PO TABS: ORAL_TABLET | ORAL | 1 refills | Status: DC

## 2019-08-21 MED ORDER — JARDIANCE 25 MG PO TABS: 25.0000 mg | ORAL_TABLET | Freq: Every day | ORAL | 3 refills | Status: DC

## 2019-08-21 MED ORDER — SITAGLIPTIN PHOSPHATE 100 MG PO TABS: 100.0000 mg | ORAL_TABLET | Freq: Every day | ORAL | 3 refills | Status: DC

## 2019-08-21 MED ORDER — PRAVASTATIN SODIUM 40 MG PO TABS: ORAL_TABLET | ORAL | 1 refills | Status: DC

## 2019-08-21 NOTE — Progress Notes (Addendum)
Subjective: CC:DM2, ears ringging PCP: [name], DO Omar Todd is a 68 y.o. male presenting to clinic today for:  1. Type 2 Diabetes with hypertension and hyperlipidemia:  Patient reports compliance with Jardiance 10 mg daily, metformin 1000 mg twice daily, Januvia 100 mg daily, Pravachol 40 mg daily, lisinopril 40 mg daily, hydrochlorothiazide 25 mg daily and amlodipine 10 mg daily.  He notes that he "took his medicine later than usual today".  He notes fasting blood sugars are ranging 1 20-1 30 with a high of 187 after he ate.  Last eye exam: Has not scheduled Last foot exam: Needs Last A1c:  Lab Results  Component Value Date   HGBA1C 7.9 (H) 08/21/2019   Nephropathy screen indicated?:  On ACE inhibitor Last flu, zoster and/or pneumovax: Needs influenza vaccine Immunization History  Administered Date(s) Administered  . Fluad Quad(high Dose 65+) 08/21/2019  . Influenza, High Dose Seasonal PF 09/21/2017, 08/29/2018  . Influenza,inj,Quad PF,6+ Mos 08/21/2013, 08/25/2014, 08/31/2015, 09/14/2016  . Pneumococcal Conjugate-13 02/12/2016  . Pneumococcal Polysaccharide-23 11/29/2013, 01/04/2019    ROS: Patient denies chest pain, shortness of breath, lower extremity edema.  He has had some ringing in his right ear that is been ongoing for a few days weeks now.  Denies any dizziness or falls.  No drainage.    ROS: Per HPI  Allergies  Allergen Reactions  . Penicillins Other (See Comments)    Pt. States he "passed out"   Past Medical History:  Diagnosis Date  . COPD (chronic obstructive pulmonary disease) ([place])   . Diabetes mellitus without complication ([place])   . GERD (gastroesophageal reflux disease)   . Hyperlipidemia   . Hypertension     Current Outpatient Medications:  .  Accu-Chek FastClix Lancets MISC, 3 TIMES DAILY, Disp: 102 each, Rfl: 3 .  ACCU-CHEK GUIDE test strip, 3 TIMES DAILY, Disp: 100 each, Rfl: 11 .  amLODipine (NORVASC) 10 MG tablet, TAKE  ONE (1) TABLET EACH DAY, Disp: 90 tablet, Rfl: 1 .  aspirin EC 81 MG tablet, Take 81 mg by mouth daily., Disp: , Rfl:  .  BIOTIN 5000 PO, Take 1 capsule by mouth daily., Disp: , Rfl:  .  blood glucose meter kit and supplies, Check BS TID and PRN dx.E11.9, Disp: 1 each, Rfl: 1 .  cholecalciferol (VITAMIN D) 1000 UNITS tablet, Take 1,000 Units by mouth daily., Disp: , Rfl:  .  empagliflozin (JARDIANCE) 10 MG TABS tablet, Take 10 mg by mouth daily., Disp: 30 tablet, Rfl: 2 .  escitalopram (LEXAPRO) 10 MG tablet, TAKE ONE (1) TABLET EACH DAY, Disp: 90 tablet, Rfl: 2 .  hydrochlorothiazide (HYDRODIURIL) 25 MG tablet, TAKE ONE (1) TABLET EACH DAY, Disp: 90 tablet, Rfl: 3 .  lisinopril (ZESTRIL) 40 MG tablet, TAKE ONE (1) TABLET EACH DAY, Disp: 90 tablet, Rfl: 1 .  meloxicam (MOBIC) 7.5 MG tablet, TAKE ONE TABLET BY MOUTH TWICE DAILY, Disp: 60 tablet, Rfl: 0 .  metFORMIN (GLUCOPHAGE) 1000 MG tablet, TAKE ONE TABLET TWICE A DAY WITH FOOD, Disp: 180 tablet, Rfl: 3 .  metoprolol tartrate (LOPRESSOR) 25 MG tablet, Take 1 tablet (25 mg total) by mouth 2 (two) times daily., Disp: 180 tablet, Rfl: 3 .  montelukast (SINGULAIR) 10 MG tablet, TAKE ONE TABLET DAILY AT BEDTIME, Disp: 90 tablet, Rfl: 0 .  omeprazole (PRILOSEC) 20 MG capsule, TAKE ONE (1) CAPSULE EACH DAY, Disp: 90 capsule, Rfl: 0 .  pravastatin (PRAVACHOL) 40 MG tablet, TAKE ONE (1) TABLET EACH DAY,  Disp: 90 tablet, Rfl: 1 .  [name] (JANUVIA) 100 MG tablet, Take 1 tablet (100 mg total) by mouth daily. (Needs to be seen before next refill), Disp: 30 tablet, Rfl: 0 Social History   Socioeconomic History  . Marital status: Married    Spouse name: [name]  . Number of children: 3  . Years of education: 9th Grade  . Highest education level: 9th grade  Occupational History  . Occupation: Retired    Comment: Science writer  Social Needs  . Financial resource strain: Not hard at all  . Food insecurity    Worry: Never true     Inability: Never true  . Transportation needs    Medical: No    Non-medical: No  Tobacco Use  . Smoking status: Current Every Day Smoker    Packs/day: 0.50    Years: 60.00    Pack years: 30.00    Types: Cigarettes  . Smokeless tobacco: Never Used  Substance and Sexual Activity  . Alcohol use: No    Alcohol/week: 0.0 standard drinks  . Drug use: No  . Sexual activity: Yes    Birth control/protection: None  Lifestyle  . Physical activity    Days per week: 7 days    Minutes per session: 60 min  . Stress: Not at all  Relationships  . Social connections    Talks on phone: More than three times a week    Gets together: Once a week    Attends religious service: Never    Active member of club or organization: No    Attends meetings of clubs or organizations: Never    Relationship status: Married  . Intimate partner violence    Fear of current or ex partner: No    Emotionally abused: No    Physically abused: No    Forced sexual activity: No  Other Topics Concern  . Not on file  Social History Narrative   Wife of almost 53 years died in 2023/11/14.     Family History  Problem Relation Age of Onset  . Cancer Father   . Diabetes Sister   . Stroke Brother   . Healthy Sister   . Healthy Sister     Objective: Office vital signs reviewed. BP (!) 177/86   Pulse 88   Temp 98.4 F (36.9 C) (Temporal)   Ht 6' (1.829 m)   Wt 216 lb (98 kg)   SpO2 100%   BMI 29.29 kg/m   Physical Examination:  General: Awake, alert, chronically ill appearing. No acute distress HEENT: Normal, MMM, sclera white; left TM without any evidence of infection or damage.  His right tympanic membrane appears to have a small perforation at the 7 o'clock position.  No active drainage or erythema appreciated. Cardio: regular rate and rhythm, S1S2 heard, no murmurs appreciated Pulm: clear to auscultation bilaterally, no wheezes, rhonchi or rales; normal work of breathing on room air Extremities: warm, well  perfused, No edema, cyanosis or clubbing; +2 pulses bilaterally Neuro: see DM foot  Diabetic Foot Exam - Simple   Simple Foot Form Diabetic Foot exam was performed with the following findings: Yes 08/21/2019  5:28 PM  Visual Inspection See comments: Yes Sensation Testing Intact to touch and monofilament testing bilaterally: Yes Pulse Check Posterior Tibialis and Dorsalis pulse intact bilaterally: Yes Comments He has a pre-ulcerative callus noted along the dorsal aspect of the right third digit.  Onychomycotic changes to the nails noted bilaterally    Results for orders  placed or performed in visit on 08/21/19 (from the past 24 hour(s))  Bayer DCA Hb A1c Waived     Status: Abnormal   Collection Time: 08/21/19  1:21 PM  Result Value Ref Range   HB A1C (BAYER DCA - WAIVED) 7.9 (H) <7.0 %   Narrative   Performed at:  [address] [place], [place]  [id number] Lab Director: Colletta [place] [facility], Phone:  [phone]   Assessment/ Plan: 68 y.o. male   1. Uncontrolled type 2 diabetes mellitus with hyperglycemia (HCC) Sugar not at goal with A1c of 7.9 today.  I have increased his Jardiance 25 mg daily.  He will continue metformin and Januvia as directed.  He will follow-up in 3 months.  In the interim, please schedule diabetic eye exam.  Foot exam performed today and showed a pre-ulcerative callus - Bayer DCA Hb A1c Waived - empagliflozin (JARDIANCE) 25 MG TABS tablet; Take 25 mg by mouth daily before breakfast.  Dispense: 30 tablet; Refill: 3 - [name] (JANUVIA) 100 MG tablet; Take 1 tablet (100 mg total) by mouth daily.  Dispense: 90 tablet; Refill: 3  2. Uncontrolled hypertension Not controlled.  Per his report due to taking his medicines late.  I would like him to follow-up in the next 2 to 4 weeks for repeat blood pressure check after he has taken his medications appropriately. - amLODipine (NORVASC) 10 MG tablet; TAKE ONE (1) TABLET EACH DAY  Dispense: 90  tablet; Refill: 1 - lisinopril (ZESTRIL) 40 MG tablet; TAKE ONE (1) TABLET EACH DAY  Dispense: 90 tablet; Refill: 1 - Basic Metabolic Panel  3. Hyperlipidemia associated with type 2 diabetes mellitus (HCC) Continue statin - pravastatin (PRAVACHOL) 40 MG tablet; TAKE ONE (1) TABLET EACH DAY  Dispense: 90 tablet; Refill: 1  4. Bilateral carotid artery stenosis - pravastatin (PRAVACHOL) 40 MG tablet; TAKE ONE (1) TABLET EACH DAY  Dispense: 90 tablet; Refill: 1  5. Need for immunization against influenza Administered during today's visit - Flu Vaccine QUAD High Dose(Fluad)  6. Tinnitus of right ear Possibly related to perforated eardrum.  Nothing on exam suggestive of infection.  I placed a referral to ENT for further evaluation - Ambulatory referral to ENT  7. Perforation of right tympanic membrane - Ambulatory referral to ENT   Orders Placed This Encounter  Procedures  . Bayer DCA Hb A1c Waived   No orders of the defined types were placed in this encounter.    [name], DO [place] [phone]

## 2019-08-21 NOTE — Patient Instructions (Signed)
Your sugar is high.  Your a1c was 7.8 today. I have increased the Jardiance to 25mg  daily.  See me in 3 months for recheck.

## 2019-08-21 NOTE — Addendum Note (Signed)
Addended by: [name] on: 08/21/2019 05:31 PM   Modules accepted: Orders

## 2019-08-23 LAB — BASIC METABOLIC PANEL
BUN/Creatinine Ratio: 15 (ref 10–24)
BUN: 15 mg/dL (ref 8–27)
CO2: 22 mmol/L (ref 20–29)
Calcium: 9.2 mg/dL (ref 8.6–10.2)
Chloride: 104 mmol/L (ref 96–106)
Creatinine, Ser: 0.99 mg/dL (ref 0.76–1.27)
GFR calc Af Amer: 90 mL/min/{1.73_m2} (ref >59)
GFR calc non Af Amer: 78 mL/min/{1.73_m2} (ref >59)
Glucose: 130 mg/dL — ABNORMAL HIGH (ref 65–99)
Potassium: 4.9 mmol/L (ref 3.5–5.2)
Sodium: 140 mmol/L (ref 134–144)

## 2019-08-29 ENCOUNTER — Encounter: Payer: Self-pay | Admitting: *Deleted

## 2019-10-01 ENCOUNTER — Other Ambulatory Visit: Payer: Self-pay | Admitting: Family Medicine

## 2019-11-04 ENCOUNTER — Encounter: Payer: Self-pay | Admitting: Family Medicine

## 2019-11-04 ENCOUNTER — Telehealth: Payer: Self-pay

## 2019-11-04 NOTE — Telephone Encounter (Signed)
Patient has requested that we send in a prescription for diabetic shoes to [facility].

## 2019-11-05 NOTE — Telephone Encounter (Signed)
Will have gina fax today.

## 2019-11-05 NOTE — Telephone Encounter (Signed)
Patient aware.

## 2019-12-02 ENCOUNTER — Encounter: Payer: Self-pay | Admitting: Family Medicine

## 2019-12-03 DIAGNOSIS — M722 Plantar fascial fibromatosis: Secondary | ICD-10-CM | POA: Diagnosis not present

## 2019-12-05 ENCOUNTER — Other Ambulatory Visit: Payer: Self-pay

## 2019-12-09 ENCOUNTER — Other Ambulatory Visit: Payer: Self-pay

## 2019-12-09 ENCOUNTER — Ambulatory Visit (INDEPENDENT_AMBULATORY_CARE_PROVIDER_SITE_OTHER): Payer: Medicare Other | Admitting: Family Medicine

## 2019-12-09 ENCOUNTER — Encounter: Payer: Self-pay | Admitting: Family Medicine

## 2019-12-09 VITALS — BP 156/80 | HR 91 | Temp 98.6°F | Ht 71.0 in | Wt 219.0 lb

## 2019-12-09 DIAGNOSIS — E1169 Type 2 diabetes mellitus with other specified complication: Secondary | ICD-10-CM | POA: Diagnosis not present

## 2019-12-09 DIAGNOSIS — E1159 Type 2 diabetes mellitus with other circulatory complications: Secondary | ICD-10-CM | POA: Diagnosis not present

## 2019-12-09 DIAGNOSIS — E785 Hyperlipidemia, unspecified: Secondary | ICD-10-CM

## 2019-12-09 DIAGNOSIS — I10 Essential (primary) hypertension: Secondary | ICD-10-CM

## 2019-12-09 DIAGNOSIS — L089 Local infection of the skin and subcutaneous tissue, unspecified: Secondary | ICD-10-CM | POA: Diagnosis not present

## 2019-12-09 DIAGNOSIS — L84 Corns and callosities: Secondary | ICD-10-CM | POA: Diagnosis not present

## 2019-12-09 DIAGNOSIS — E1140 Type 2 diabetes mellitus with diabetic neuropathy, unspecified: Secondary | ICD-10-CM | POA: Diagnosis not present

## 2019-12-09 DIAGNOSIS — E1165 Type 2 diabetes mellitus with hyperglycemia: Secondary | ICD-10-CM

## 2019-12-09 DIAGNOSIS — I152 Hypertension secondary to endocrine disorders: Secondary | ICD-10-CM

## 2019-12-09 LAB — BAYER DCA HB A1C WAIVED: HB A1C (BAYER DCA - WAIVED): 8.7 % — ABNORMAL HIGH (ref <7.0)

## 2019-12-09 MED ORDER — ACCU-CHEK GUIDE VI STRP: ORAL_STRIP | 11 refills | Status: DC

## 2019-12-09 NOTE — Progress Notes (Signed)
Subjective: CC:DM2, ears ringging PCP: [name], DO GGY:IRSWN Omar Todd is a 69 y.o. male presenting to clinic today for:  1. Type 2 Diabetes with hypertension and hyperlipidemia:  Patient reports compliance with Jardiance 10 mg daily, metformin 1000 mg twice daily, Januvia 100 mg daily, Pravachol 40 mg daily, lisinopril 40 mg daily, hydrochlorothiazide 25 mg daily and amlodipine 10 mg daily.  He checks his blood sugars 3 times a day.  He does report some numbness and tingling occasionally in the lower extremities.  No chest pain, shortness of breath, dizziness or falls.  He admits to eating whatever he wants lately due to the stress of his wife being hospitalized.  Apparently she has dementia.  He expects her to be released today sometime.  He unfortunately never heard back from [facility] regarding his diabetic shoes, though these were ordered in December.  He asked that we resend this please.  He has an appointment with Dr. Irving Shows next Tuesday for an ongoing nail infection that has been on the left toe.  He is currently on doxycycline for this.  Last eye exam: Has not scheduled Last foot exam: Up-to-date Last A1c:  Lab Results  Component Value Date   HGBA1C 7.9 (H) 08/21/2019   Nephropathy screen indicated?:  On ACE inhibitor Last flu, zoster and/or pneumovax: Up-to-date Immunization History  Administered Date(s) Administered  . Fluad Quad(high Dose 65+) 08/21/2019  . Influenza, High Dose Seasonal PF 09/21/2017, 08/29/2018  . Influenza,inj,Quad PF,6+ Mos 08/21/2013, 08/25/2014, 08/31/2015, 09/14/2016  . Pneumococcal Conjugate-13 02/12/2016  . Pneumococcal Polysaccharide-23 11/29/2013, 01/04/2019    ROS: Per HPI  Allergies  Allergen Reactions  . Penicillins Other (See Comments)    Pt. States he "passed out"   Past Medical History:  Diagnosis Date  . COPD (chronic obstructive pulmonary disease) ([place])   . Diabetes mellitus without complication ([place])   . GERD  (gastroesophageal reflux disease)   . Hyperlipidemia   . Hypertension     Current Outpatient Medications:  .  Accu-Chek FastClix Lancets MISC, 3 TIMES DAILY, Disp: 102 each, Rfl: 3 .  ACCU-CHEK GUIDE test strip, 3 TIMES DAILY, Disp: 100 each, Rfl: 11 .  amLODipine (NORVASC) 10 MG tablet, TAKE ONE (1) TABLET EACH DAY, Disp: 90 tablet, Rfl: 1 .  aspirin EC 81 MG tablet, Take 81 mg by mouth daily., Disp: , Rfl:  .  BIOTIN 5000 PO, Take 1 capsule by mouth daily., Disp: , Rfl:  .  blood glucose meter kit and supplies, Check BS TID and PRN dx.E11.9, Disp: 1 each, Rfl: 1 .  cholecalciferol (VITAMIN D) 1000 UNITS tablet, Take 1,000 Units by mouth daily., Disp: , Rfl:  .  diclofenac (VOLTAREN) 75 MG EC tablet, Take 75 mg by mouth 2 (two) times daily., Disp: , Rfl:  .  doxycycline (VIBRAMYCIN) 100 MG capsule, Take 100 mg by mouth 2 (two) times daily., Disp: , Rfl:  .  empagliflozin (JARDIANCE) 25 MG TABS tablet, Take 25 mg by mouth daily before breakfast., Disp: 30 tablet, Rfl: 3 .  escitalopram (LEXAPRO) 10 MG tablet, TAKE ONE (1) TABLET EACH DAY, Disp: 90 tablet, Rfl: 0 .  hydrochlorothiazide (HYDRODIURIL) 25 MG tablet, TAKE ONE (1) TABLET EACH DAY, Disp: 90 tablet, Rfl: 3 .  lisinopril (ZESTRIL) 40 MG tablet, TAKE ONE (1) TABLET EACH DAY, Disp: 90 tablet, Rfl: 1 .  meloxicam (MOBIC) 7.5 MG tablet, TAKE ONE TABLET BY MOUTH TWICE DAILY, Disp: 60 tablet, Rfl: 0 .  metFORMIN (GLUCOPHAGE) 1000  MG tablet, TAKE ONE TABLET TWICE A DAY WITH FOOD, Disp: 180 tablet, Rfl: 3 .  metoprolol tartrate (LOPRESSOR) 25 MG tablet, Take 1 tablet (25 mg total) by mouth 2 (two) times daily., Disp: 180 tablet, Rfl: 3 .  montelukast (SINGULAIR) 10 MG tablet, TAKE ONE TABLET DAILY AT BEDTIME, Disp: 90 tablet, Rfl: 0 .  omeprazole (PRILOSEC) 20 MG capsule, TAKE ONE (1) CAPSULE EACH DAY, Disp: 90 capsule, Rfl: 0 .  pravastatin (PRAVACHOL) 40 MG tablet, TAKE ONE (1) TABLET EACH DAY, Disp: 90 tablet, Rfl: 1 .  [name]  (JANUVIA) 100 MG tablet, Take 1 tablet (100 mg total) by mouth daily., Disp: 90 tablet, Rfl: 3 Social History   Socioeconomic History  . Marital status: Married    Spouse name: [name]  . Number of children: 3  . Years of education: 9th Grade  . Highest education level: 9th grade  Occupational History  . Occupation: Retired    Comment: Science writer  Tobacco Use  . Smoking status: Current Every Day Smoker    Packs/day: 0.50    Years: 60.00    Pack years: 30.00    Types: Cigarettes  . Smokeless tobacco: Never Used  Substance and Sexual Activity  . Alcohol use: No    Alcohol/week: 0.0 standard drinks  . Drug use: No  . Sexual activity: Yes    Birth control/protection: None  Other Topics Concern  . Not on file  Social History Narrative  . Not on file   Social Determinants of Health   Financial Resource Strain: Low Risk   . Difficulty of Paying Living Expenses: Not hard at all  Food Insecurity: No Food Insecurity  . Worried About Charity fundraiser in the Last Year: Never true  . Ran Out of Food in the Last Year: Never true  Transportation Needs: No Transportation Needs  . Lack of Transportation (Medical): No  . Lack of Transportation (Non-Medical): No  Physical Activity: Sufficiently Active  . Days of Exercise per Week: 7 days  . Minutes of Exercise per Session: 60 min  Stress: No Stress Concern Present  . Feeling of Stress : Not at all  Social Connections: Somewhat Isolated  . Frequency of Communication with Friends and Family: More than three times a week  . Frequency of Social Gatherings with Friends and Family: Once a week  . Attends Religious Services: Never  . Active Member of Clubs or Organizations: No  . Attends Archivist Meetings: Never  . Marital Status: Married  Human resources officer Violence: Not At Risk  . Fear of Current or Ex-Partner: No  . Emotionally Abused: No  . Physically Abused: No  . Sexually Abused: No   Family History   Problem Relation Age of Onset  . Cancer Father   . Diabetes Sister   . Stroke Brother   . Healthy Sister   . Healthy Sister     Objective: Office vital signs reviewed. BP (!) 165/77   Pulse 91   Temp 98.6 F (37 C) (Temporal)   Ht '5\' 11"'  (1.803 m)   Wt 219 lb (99.3 kg)   SpO2 97%   BMI 30.54 kg/m   Physical Examination:  General: Awake, alert, chronically ill appearing. No acute distress HEENT: Normal, MMM, sclera white  Cardio: regular rate and rhythm, S1S2 heard, 3/6 SEM appreciated Pulm: Globally decreased breath sounds with fair air movement.  No wheezes, rhonchi or rales.  Normal work of breathing on room air Extremities: warm, well  perfused, No edema, cyanosis or clubbing; +2 pulses bilaterally Feet: He has a small amount of purulent drainage noted along the lateral aspect of the left great toe.  There is no significant erythema, induration or increased warmth.  He has onychomycotic changes throughout bilateral feet with preulcerative calluses noted on many digits.  Lab Results  Component Value Date   HGBA1C 8.7 (H) 12/09/2019   Assessment/ Plan: 69 y.o. male   1. Uncontrolled type 2 diabetes mellitus with hyperglycemia (HCC) A1c continues to climb at 8.7 today.  I had a frank discussion with him today with regards to need to escalate medications.  However, he was very adamant about not starting anything else just yet.  He really wants to pursue diet modification before adding additional medicine.  I am considering adding Ozempic versus Trulicity given degree of elevation in blood sugar.  I doubt that glimepiride or glipizide would give sufficient reduction at this point.  I encouraged him to get his diabetic eye exam.  Will hold off for another 3 months given patient essentially declining additional medicine today.  I did offer referral to dietitian but again he declined this today.  I counseled him at length on foods that are high in carbs and sugar so that he can avoid  these things. - hgba1c  2. Hyperlipidemia associated with type 2 diabetes mellitus ([place]) Continue statin  3. Hypertension associated with diabetes ([place]) Not controlled.  Again, I considered adding hydralazine but patient again wanted to try and reduce salt intake so as to avoid extra medications.  We discussed the risks of stroke and heart attack given both uncontrolled blood sugar and uncontrolled blood pressure and he accepted these risks.  4. Pre-ulcerative calluses A new prescription for diabetic shoes has been sent again to [facility].  5. Toe infection Currently being managed by podiatrist, Dr. Irving Shows.  He is on doxycycline.  Per his report it is improving today.   Orders Placed This Encounter  Procedures  . hgba1c   No orders of the defined types were placed in this encounter.    [name], DO [place] [phone]

## 2019-12-09 NOTE — Addendum Note (Signed)
Addended by: [name] on: 12/09/2019 12:26 PM   Modules accepted: Orders

## 2019-12-09 NOTE — Patient Instructions (Signed)
Your prescription was sent over in December to [facility]. I am not sure why this has not been completed for you.  We are sending this over again today.  Your sugar is not well controlled in fact it seems to be getting worse than last time. We talked about adding an injectable like Ozempic or Trulicity today but you wanted to hold off on this to try and get rid of the sugar and carbs. Anything that has sugar in it you should avoid including sweets and ice cream. Carb type foods are bread, pasta, potatoes, rice. Try and avoid these as well.  Your blood pressure is high as well today. You are already on several medications but we may need to add more if you cannot get this under control. Try and stay away from salty foods including ham and deli meat.

## 2019-12-13 ENCOUNTER — Other Ambulatory Visit: Payer: Self-pay | Admitting: Family Medicine

## 2020-01-07 ENCOUNTER — Other Ambulatory Visit: Payer: Self-pay | Admitting: Family Medicine

## 2020-01-20 ENCOUNTER — Encounter: Payer: Self-pay | Admitting: Family Medicine

## 2020-01-21 DIAGNOSIS — L84 Corns and callosities: Secondary | ICD-10-CM | POA: Diagnosis not present

## 2020-01-21 DIAGNOSIS — E119 Type 2 diabetes mellitus without complications: Secondary | ICD-10-CM | POA: Diagnosis not present

## 2020-01-21 DIAGNOSIS — E1140 Type 2 diabetes mellitus with diabetic neuropathy, unspecified: Secondary | ICD-10-CM | POA: Diagnosis not present

## 2020-01-22 ENCOUNTER — Telehealth: Payer: Self-pay | Admitting: Family Medicine

## 2020-01-22 NOTE — Chronic Care Management (AMB) (Signed)
Chronic Care Management   Note  01/22/2020 Name: EMERIC NOVINGER MRN: [id number] DOB: August 31, 1951  ALIK MAWSON is a 69 y.o. year old male who is a primary care patient of [name], DO. I reached out to Jackquline Bosch by phone today in response to a referral sent by Mr. Harvie Morua Fontanella's health plan.     Mr. Pousson was given information about Chronic Care Management services today including:  1. CCM service includes personalized support from designated clinical staff supervised by his physician, including individualized plan of care and coordination with other care providers 2. 24/7 contact phone numbers for assistance for urgent and routine care needs. 3. Service will only be billed when office clinical staff spend 20 minutes or more in a month to coordinate care. 4. Only one practitioner may furnish and bill the service in a calendar month. 5. The patient may stop CCM services at any time (effective at the end of the month) by phone call to the office staff. 6. The patient will be responsible for cost sharing (co-pay) of up to 20% of the service fee (after annual deductible is met).  Patient did not agree to enrollment in care management services and does not wish to consider at this time.  Follow up plan: The patient has been provided with contact information for the care management team and has been advised to call with any health related questions or concerns.   Noreene Larsson, [address], [place], [place] [address] Direct Dial: [phone] [email or web address] Website: [email or web address]

## 2020-01-29 ENCOUNTER — Other Ambulatory Visit: Payer: Self-pay | Admitting: Family Medicine

## 2020-01-29 ENCOUNTER — Encounter: Payer: Self-pay | Admitting: Family Medicine

## 2020-01-29 ENCOUNTER — Ambulatory Visit (INDEPENDENT_AMBULATORY_CARE_PROVIDER_SITE_OTHER): Payer: Medicare Other | Admitting: *Deleted

## 2020-01-29 VITALS — Ht 71.0 in | Wt 219.0 lb

## 2020-01-29 DIAGNOSIS — Z0000 Encounter for general adult medical examination without abnormal findings: Secondary | ICD-10-CM

## 2020-01-29 DIAGNOSIS — E1165 Type 2 diabetes mellitus with hyperglycemia: Secondary | ICD-10-CM

## 2020-01-29 MED ORDER — SITAGLIPTIN PHOSPHATE 100 MG PO TABS: 100.0000 mg | ORAL_TABLET | Freq: Every day | ORAL | 0 refills | Status: DC

## 2020-01-29 NOTE — Patient Instructions (Signed)
Omar Todd , Thank you for taking time to come for your Medicare Wellness Visit. I appreciate your ongoing commitment to your health goals. Please review the following plan we discussed and let me know if I can assist you in the future.   These are the goals we discussed: Goals    . Exercise 3x per week (30 min per time)     Try to exercise for at least 30 minutes, 3 times weekly Walking Stationary bike Hand weights     . Have 3 meals a day     Eat 3 healthy meals daily that consist of lean proteins, fruits and vegetables Decrease carbohydrates Increase water intake        This is a list of the screening recommended for you and due dates:  Health Maintenance  Topic Date Due  .  Hepatitis C: One time screening is recommended by Center for Disease Control  (CDC) for  adults born from [birth year] through 1966.   [birth year]  . Colon Cancer Screening  11/12/2000  . Eye exam for diabetics  06/27/2019  . Hemoglobin A1C  06/07/2020  . Complete foot exam   08/20/2020  . Tetanus Vaccine  11/05/2020  . Flu Shot  Completed  . Pneumonia vaccines  Completed     Advance Directive  Advance directives are legal documents that let you make choices ahead of time about your health care and medical treatment in case you become unable to communicate for yourself. Advance directives are a way for you to make known your wishes to family, friends, and health care providers. This can let others know about your end-of-life care if you become unable to communicate. Discussing and writing advance directives should happen over time rather than all at once. Advance directives can be changed depending on your situation and what you want, even after you have signed the advance directives. There are different types of advance directives, such as:  Medical power of attorney.  Living will.  Do not resuscitate (DNR) or do not attempt resuscitation (DNAR) order. Health care proxy and medical power of attorney A  health care proxy is also called a health care agent. This is a person who is appointed to make medical decisions for you in cases where you are unable to make the decisions yourself. Generally, people choose someone they know well and trust to represent their preferences. Make sure to ask this person for an agreement to act as your proxy. A proxy may have to exercise judgment in the event of a medical decision for which your wishes are not known. A medical power of attorney is a legal document that names your health care proxy. Depending on the laws in your state, after the document is written, it may also need to be:  Signed.  Notarized.  Dated.  Copied.  Witnessed.  Incorporated into your medical record. You may also want to appoint someone to manage your money in a situation in which you are unable to do so. This is called a durable power of attorney for finances. It is a separate legal document from the durable power of attorney for health care. You may choose the same person or someone different from your health care proxy to act as your agent in money matters. If you do not appoint a proxy, or if there is a concern that the proxy is not acting in your best interests, a court may appoint a guardian to act on your behalf. Living will A living  will is a set of instructions that state your wishes about medical care when you cannot express them yourself. Health care providers should keep a copy of your living will in your medical record. You may want to give a copy to family members or friends. To alert caregivers in case of an emergency, you can place a card in your wallet to let them know that you have a living will and where they can find it. A living will is used if you become:  Terminally ill.  Disabled.  Unable to communicate or make decisions. Items to consider in your living will include:  To use or not to use life-support equipment, such as dialysis machines and breathing  machines (ventilators).  A DNR or DNAR order. This tells health care providers not to use cardiopulmonary resuscitation (CPR) if breathing or heartbeat stops.  To use or not to use tube feeding.  To be given or not to be given food and fluids.  Comfort (palliative) care when the goal becomes comfort rather than a cure.  Donation of organs and tissues. A living will does not give instructions for distributing your money and property if you should pass away. DNR or DNAR A DNR or DNAR order is a request not to have CPR in the event that your heart stops beating or you stop breathing. If a DNR or DNAR order has not been made and shared, a health care provider will try to help any patient whose heart has stopped or who has stopped breathing. If you plan to have surgery, talk with your health care provider about how your DNR or DNAR order will be followed if problems occur. What if I do not have an advance directive? If you do not have an advance directive, some states assign family decision makers to act on your behalf based on how closely you are related to them. Each state has its own laws about advance directives. You may want to check with your health care provider, attorney, or state representative about the laws in your state. Summary  Advance directives are the legal documents that allow you to make choices ahead of time about your health care and medical treatment in case you become unable to tell others about your care.  The process of discussing and writing advance directives should happen over time. You can change the advance directives, even after you have signed them.  Advance directives include DNR or DNAR orders, living wills, and designating an agent as your medical power of attorney. This information is not intended to replace advice given to you by your health care provider. Make sure you discuss any questions you have with your health care provider. Document Revised: 06/06/2019  Document Reviewed: 06/06/2019 Elsevier Patient Education  [name].

## 2020-01-29 NOTE — Telephone Encounter (Signed)
Patient aware and verbalizes understanding. 

## 2020-01-29 NOTE — Progress Notes (Signed)
MEDICARE ANNUAL WELLNESS VISIT  01/29/2020  Telephone Visit Disclaimer This Medicare AWV was conducted by telephone due to national recommendations for restrictions regarding the COVID-19 Pandemic (e.g. social distancing).  I verified, using two identifiers, that I am speaking with Omar Todd or their authorized healthcare agent. I discussed the limitations, risks, security, and privacy concerns of performing an evaluation and management service by telephone and the potential availability of an in-person appointment in the future. The patient expressed understanding and agreed to proceed.   Subjective:  Omar Todd is a 69 y.o. male patient of Omar Norlander, DO who had a Medicare Annual Wellness Visit today via telephone. Omar Todd is Retired and lives with their spouse. he has 3 children. he reports that he is socially active and does interact with friends/family regularly. he is minimally physically active and enjoys fishing.  Patient Care Team: Omar Norlander, DO as PCP - General (Family Medicine)  Advanced Directives 01/29/2020  Does Patient Have a Medical Advance Directive? No  Would patient like information on creating a medical advance directive? Yes (MAU/Ambulatory/Procedural Areas - Information given)    Hospital Utilization Over the Past 12 Months: # of hospitalizations or ER visits: 0 # of surgeries: 0  Review of Systems    Patient reports that his overall health is unchanged compared to last year.  History obtained from chart review and the patient General ROS: negative  Patient Reported Readings (BP, Pulse, CBG, Weight, etc) Home CBG 131  Pain Assessment Pain : 0-10 Pain Score: 9  Pain Type: Acute pain Pain Location: Foot Pain Orientation: Right Pain Descriptors / Indicators: Sharp Pain Onset: More than a month ago Pain Frequency: Constant Pain Relieving Factors: Warm [place]  Pain Relieving Factors: Warm [place]  Current Medications  & Allergies (verified) Allergies as of 01/29/2020      Reactions   Penicillins Other (See Comments)   Pt. States he "passed out"      Medication List       Accurate as of January 29, 2020  9:51 AM. If you have any questions, ask your nurse or doctor.        Accu-Chek FastClix Lancets Misc 3 TIMES DAILY   Accu-Chek Guide test strip Generic drug: glucose blood Check blood sugar 1-3 times daily as directed.  Dx E11.65   amLODipine 10 MG tablet Commonly known as: NORVASC TAKE ONE (1) TABLET EACH DAY   aspirin EC 81 MG tablet Take 81 mg by mouth daily.   BIOTIN 5000 PO Take 1 capsule by mouth daily.   blood glucose meter kit and supplies Check BS TID and PRN dx.E11.9   cholecalciferol 1000 units tablet Commonly known as: VITAMIN D Take 1,000 Units by mouth daily.   diclofenac 75 MG EC tablet Commonly known as: VOLTAREN Take 75 mg by mouth 2 (two) times daily.   doxycycline 100 MG capsule Commonly known as: VIBRAMYCIN Take 100 mg by mouth 2 (two) times daily.   escitalopram 10 MG tablet Commonly known as: LEXAPRO TAKE ONE (1) TABLET EACH DAY   hydrochlorothiazide 25 MG tablet Commonly known as: HYDRODIURIL TAKE ONE (1) TABLET EACH DAY   Jardiance 25 MG Tabs tablet Generic drug: empagliflozin Take 25 mg by mouth daily before breakfast.   lisinopril 40 MG tablet Commonly known as: ZESTRIL TAKE ONE (1) TABLET EACH DAY   meloxicam 7.5 MG tablet Commonly known as: MOBIC TAKE ONE TABLET BY MOUTH TWICE DAILY   metFORMIN 1000  MG tablet Commonly known as: GLUCOPHAGE TAKE ONE TABLET TWICE A DAY WITH FOOD   metoprolol tartrate 25 MG tablet Commonly known as: LOPRESSOR Take 1 tablet (25 mg total) by mouth 2 (two) times daily.   montelukast 10 MG tablet Commonly known as: SINGULAIR TAKE ONE TABLET DAILY AT BEDTIME   omeprazole 20 MG capsule Commonly known as: PRILOSEC TAKE ONE (1) CAPSULE EACH DAY   pravastatin 40 MG tablet Commonly known as:  PRAVACHOL TAKE ONE (1) TABLET EACH DAY   [name] 100 MG tablet Commonly known as: Januvia Take 1 tablet (100 mg total) by mouth daily.       History (reviewed): Past Medical History:  Diagnosis Date  . COPD (chronic obstructive pulmonary disease) ([place])   . Diabetes mellitus without complication ([place])   . GERD (gastroesophageal reflux disease)   . Hyperlipidemia   . Hypertension    Past Surgical History:  Procedure Laterality Date  . Thumb surgery     Family History  Problem Relation Age of Onset  . Cancer Father   . Diabetes Sister   . Stroke Brother   . Healthy Sister   . Healthy Sister    Social History   Socioeconomic History  . Marital status: Married    Spouse name: Omar Todd  . Number of children: 3  . Years of education: 9th Grade  . Highest education level: 9th grade  Occupational History  . Occupation: Retired    Comment: Science writer  Tobacco Use  . Smoking status: Current Every Day Smoker    Packs/day: 0.50    Years: 60.00    Pack years: 30.00    Types: Cigarettes  . Smokeless tobacco: Never Used  Substance and Sexual Activity  . Alcohol use: No    Alcohol/week: 0.0 standard drinks  . Drug use: No  . Sexual activity: Yes    Birth control/protection: None  Other Topics Concern  . Not on file  Social History Narrative  . Not on file   Social Determinants of Health   Financial Resource Strain: Low Risk   . Difficulty of Paying Living Expenses: Not very hard  Food Insecurity: No Food Insecurity  . Worried About Charity fundraiser in the Last Year: Never true  . Ran Out of Food in the Last Year: Never true  Transportation Needs: No Transportation Needs  . Lack of Transportation (Medical): No  . Lack of Transportation (Non-Medical): No  Physical Activity: Inactive  . Days of Exercise per Week: 0 days  . Minutes of Exercise per Session: 0 min  Stress: No Stress Concern Present  . Feeling of Stress : Only a little  Social  Connections: Not Isolated  . Frequency of Communication with Friends and Family: More than three times a week  . Frequency of Social Gatherings with Friends and Family: More than three times a week  . Attends Religious Services: More than 4 times per year  . Active Member of Clubs or Organizations: Yes  . Attends Archivist Meetings: More than 4 times per year  . Marital Status: Married    Activities of Daily Living In your present state of health, do you have any difficulty performing the following activities: 01/29/2020  Hearing? N  Vision? N  Difficulty concentrating or making decisions? N  Walking or climbing stairs? N  Dressing or bathing? N  Doing errands, shopping? N  Preparing Food and eating ? N  Using the Toilet? N  In the past six  months, have you accidently leaked urine? N  Do you have problems with loss of bowel control? N  Managing your Medications? N  Managing your Finances? N  Housekeeping or managing your Housekeeping? N  Some recent data might be hidden    Patient Education/ Literacy How often do you need to have someone help you when you read instructions, pamphlets, or other written materials from your doctor or pharmacy?: 1 - Never What is the last grade level you completed in school?: 9th Grade  Exercise Current Exercise Habits: The patient does not participate in regular exercise at present, Exercise limited by: None identified  Diet Patient reports consuming 3 meals a day and 2 snack(s) a day Patient reports that his primary diet is: Regular Patient reports that she does have regular access to food.   Depression Screen PHQ 2/9 Scores 01/29/2020 01/29/2020 12/09/2019 08/21/2019 04/05/2019 01/28/2019 01/04/2019  PHQ - 2 Score 0 0 0 0 0 0 0  PHQ- 9 Score - 0 0 0 0 - 0     Fall Risk Fall Risk  01/29/2020 01/29/2020 08/21/2019 04/05/2019 01/28/2019  Falls in the past year? 0 0 0 0 0  Number falls in past yr: 0 0 - - -  Injury with Fall? 0 0 - - -  Risk  for fall due to : No Fall Risks No Fall Risks - - -  Follow up Falls evaluation completed Falls evaluation completed - - -     Objective:  Omar Todd seemed alert and oriented and he participated appropriately during our telephone visit.  Blood Pressure Weight BMI  BP Readings from Last 3 Encounters:  12/09/19 (!) 156/80  08/21/19 (!) 177/86  04/05/19 (!) 190/89   Wt Readings from Last 3 Encounters:  01/29/20 219 lb (99.3 kg)  12/09/19 219 lb (99.3 kg)  08/21/19 216 lb (98 kg)   BMI Readings from Last 1 Encounters:  01/29/20 30.54 kg/m    *Unable to obtain current vital signs, weight, and BMI due to telephone visit type  Hearing/Vision  . Skyy did  seem to have difficulty with hearing/understanding during the telephone conversation . Reports that he has not had a formal eye exam by an eye care professional within the past year . Reports that he has not had a formal hearing evaluation within the past year *Unable to fully assess hearing and vision during telephone visit type  Cognitive Function: 6CIT Screen 01/29/2020  What Year? 0 points  What month? 0 points  What time? 0 points  Count back from 20 0 points  Months in reverse 4 points  Repeat phrase 0 points  Total Score 4   (Normal:0-7, Significant for Dysfunction: >8)  Normal Cognitive Function Screening: Yes   Immunization & Health Maintenance Record Immunization History  Administered Date(s) Administered  . Fluad Quad(high Dose 65+) 08/21/2019  . Influenza, High Dose Seasonal PF 09/21/2017, 08/29/2018  . Influenza,inj,Quad PF,6+ Mos 08/21/2013, 08/25/2014, 08/31/2015, 09/14/2016  . Pneumococcal Conjugate-13 02/12/2016  . Pneumococcal Polysaccharide-23 11/29/2013, 01/04/2019    Health Maintenance  Topic Date Due  . Hepatitis C Screening  [birth year]  . COLONOSCOPY  11/12/2000  . OPHTHALMOLOGY EXAM  06/27/2019  . HEMOGLOBIN A1C  06/07/2020  . FOOT EXAM  08/20/2020  . TETANUS/TDAP  11/05/2020  .  INFLUENZA VACCINE  Completed  . PNA vac Low Risk Adult  Completed       Assessment  This is a routine wellness examination for Omar Todd.  Health Maintenance: Due  or Overdue Health Maintenance Due  Topic Date Due  . Hepatitis C Screening  September 05, 1951  . COLONOSCOPY  11/12/2000  . OPHTHALMOLOGY EXAM  06/27/2019    Omar Todd does not need a referral for Community Assistance: Care Management:   no Social Work:    no Prescription Assistance:  no Nutrition/Diabetes Education:  no   Plan:  Personalized Goals Goals Addressed   None    Personalized Health Maintenance & Screening Recommendations  Colorectal cancer screening Glaucoma screening Advanced directives: has NO advanced directive  - add't info requested. Referral to SW: no Hepatitis C screening  Lung Cancer Screening Recommended: yes (Low Dose CT Chest recommended if Age 33-80 years, 30 pack-year currently smoking OR have quit w/in past 15 years) Hepatitis C Screening recommended: yes HIV Screening recommended: no  Advanced Directives: Written information was prepared per patient's request.  Referrals & Orders No orders of the defined types were placed in this encounter.   Follow-up Plan . Follow-up with Omar Norlander, DO as planned . Schedule Eye Exam  I have personally reviewed and noted the following in the patient's chart:   . Medical and social history . Use of alcohol, tobacco or illicit drugs  . Current medications and supplements . Functional ability and status . Nutritional status . Physical activity . Advanced directives . List of other physicians . Hospitalizations, surgeries, and ER visits in previous 12 months . Vitals . Screenings to include cognitive, depression, and falls . Referrals and appointments  In addition, I have reviewed and discussed with Omar Todd certain preventive protocols, quality metrics, and best practice recommendations. A written personalized care  plan for preventive services as well as general preventive health recommendations is available and can be mailed to the patient at his request.      Wardell Heath, LPN  7/97/2820

## 2020-01-29 NOTE — Telephone Encounter (Signed)
  Medication Request  01/29/2020  What is the name of the medication? januvia 100mg  and diclofenac 75mg   Have you contacted your pharmacy to request a refill? no  Which pharmacy would you like this sent to? The drug store in [place]   Patient notified that their request is being sent to the clinical staff for review and that they should receive a call once it is complete. If they do not receive a call within 24 hours they can check with their pharmacy or our office.

## 2020-01-29 NOTE — Telephone Encounter (Signed)
If can use Tylenol or TOPICAL voltaren gel, prefer that he use that over oral voltaren since BPs have been uncontrolled.  NSAIDs can worsen BP

## 2020-02-01 ENCOUNTER — Encounter: Payer: Self-pay | Admitting: Family Medicine

## 2020-02-04 ENCOUNTER — Other Ambulatory Visit: Payer: Self-pay

## 2020-02-04 ENCOUNTER — Ambulatory Visit (INDEPENDENT_AMBULATORY_CARE_PROVIDER_SITE_OTHER): Payer: Medicare Other | Admitting: Family Medicine

## 2020-02-04 ENCOUNTER — Encounter: Payer: Self-pay | Admitting: Family Medicine

## 2020-02-04 VITALS — BP 152/74 | HR 77 | Temp 98.6°F | Resp 20 | Ht 71.0 in | Wt 217.0 lb

## 2020-02-04 DIAGNOSIS — L97418 Non-pressure chronic ulcer of right heel and midfoot with other specified severity: Secondary | ICD-10-CM

## 2020-02-04 DIAGNOSIS — L03115 Cellulitis of right lower limb: Secondary | ICD-10-CM

## 2020-02-04 DIAGNOSIS — E11621 Type 2 diabetes mellitus with foot ulcer: Secondary | ICD-10-CM | POA: Diagnosis not present

## 2020-02-04 DIAGNOSIS — L97518 Non-pressure chronic ulcer of other part of right foot with other specified severity: Secondary | ICD-10-CM

## 2020-02-04 NOTE — Patient Instructions (Signed)
To ED for evaluation

## 2020-02-04 NOTE — Progress Notes (Addendum)
Subjective:  [name], male    DOB: September 04, 1951, 69 y.o.   MRN: [id number]  Patient Care Team: [name], DO as PCP - General (Family Medicine)   Chief Complaint:  Right foot red and painful [place] 4 months ago)   HPI: Omar Todd is a 69 y.o. male presenting on 02/04/2020 for Right foot red and painful (Fell 4 months ago)   Pt presents today for evaluation of ongoing and worsening right foot infection. Medical history significant for diabetes, neuropathy, and hyperlipidemia. Pt states he feel around 4 months ago and this has been ongoing since then. He states his foot does not hurt during the day but hurts at night. He was seen in January for a toe infection and placed on doxycycline. Pt reports increased discoloration, swelling, and slight drainage from the ulcer to his great toe. He reports he completed the course of antibiotics without improvement. He denies fever, chills, or weakness.      Relevant past medical, surgical, family, and social history reviewed and updated as indicated.  Allergies and medications reviewed and updated. Date reviewed: Chart in Epic.   Past Medical History:  Diagnosis Date  . COPD (chronic obstructive pulmonary disease) ([place])   . Diabetes mellitus without complication ([place])   . GERD (gastroesophageal reflux disease)   . Hyperlipidemia   . Hypertension     Past Surgical History:  Procedure Laterality Date  . Thumb surgery      Social History   Socioeconomic History  . Marital status: Married    Spouse name: [name]  . Number of children: 3  . Years of education: 9th Grade  . Highest education level: 9th grade  Occupational History  . Occupation: Retired    Comment: Science writer  Tobacco Use  . Smoking status: Current Every Day Smoker    Packs/day: 0.50    Years: 60.00    Pack years: 30.00    Types: Cigarettes  . Smokeless tobacco: Never Used  Substance and Sexual Activity  . Alcohol use:  No    Alcohol/week: 0.0 standard drinks  . Drug use: No  . Sexual activity: Yes    Birth control/protection: None  Other Topics Concern  . Not on file  Social History Narrative  . Not on file   Social Determinants of Health   Financial Resource Strain: Low Risk   . Difficulty of Paying Living Expenses: Not very hard  Food Insecurity: No Food Insecurity  . Worried About Charity fundraiser in the Last Year: Never true  . Ran Out of Food in the Last Year: Never true  Transportation Needs: No Transportation Needs  . Lack of Transportation (Medical): No  . Lack of Transportation (Non-Medical): No  Physical Activity: Inactive  . Days of Exercise per Week: 0 days  . Minutes of Exercise per Session: 0 min  Stress: No Stress Concern Present  . Feeling of Stress : Only a little  Social Connections: Not Isolated  . Frequency of Communication with Friends and Family: More than three times a week  . Frequency of Social Gatherings with Friends and Family: More than three times a week  . Attends Religious Services: More than 4 times per year  . Active Member of Clubs or Organizations: Yes  . Attends Archivist Meetings: More than 4 times per year  . Marital Status: Married  Human resources officer Violence: Not At Risk  . Fear of Current or Ex-Partner:  No  . Emotionally Abused: No  . Physically Abused: No  . Sexually Abused: No    Outpatient Encounter Medications as of 02/04/2020  Medication Sig  . Accu-Chek FastClix Lancets MISC 3 TIMES DAILY  . amLODipine (NORVASC) 10 MG tablet TAKE ONE (1) TABLET EACH DAY  . aspirin EC 81 MG tablet Take 81 mg by mouth daily.  Marland Kitchen BIOTIN 5000 PO Take 1 capsule by mouth daily.  . blood glucose meter kit and supplies Check BS TID and PRN dx.E11.9  . cholecalciferol (VITAMIN D) 1000 UNITS tablet Take 1,000 Units by mouth daily.  . diclofenac (VOLTAREN) 75 MG EC tablet Take 75 mg by mouth 2 (two) times daily.  . empagliflozin (JARDIANCE) 25 MG TABS  tablet Take 25 mg by mouth daily before breakfast.  . escitalopram (LEXAPRO) 10 MG tablet TAKE ONE (1) TABLET EACH DAY  . glucose blood (ACCU-CHEK GUIDE) test strip Check blood sugar 1-3 times daily as directed.  Dx E11.65  . hydrochlorothiazide (HYDRODIURIL) 25 MG tablet TAKE ONE (1) TABLET EACH DAY  . lisinopril (ZESTRIL) 40 MG tablet TAKE ONE (1) TABLET EACH DAY  . meloxicam (MOBIC) 7.5 MG tablet TAKE ONE TABLET BY MOUTH TWICE DAILY  . metFORMIN (GLUCOPHAGE) 1000 MG tablet TAKE ONE TABLET TWICE A DAY WITH FOOD  . metoprolol tartrate (LOPRESSOR) 25 MG tablet Take 1 tablet (25 mg total) by mouth 2 (two) times daily.  . montelukast (SINGULAIR) 10 MG tablet TAKE ONE TABLET DAILY AT BEDTIME  . omeprazole (PRILOSEC) 20 MG capsule TAKE ONE (1) CAPSULE EACH DAY  . pravastatin (PRAVACHOL) 40 MG tablet TAKE ONE (1) TABLET EACH DAY  . [name] (JANUVIA) 100 MG tablet Take 1 tablet (100 mg total) by mouth daily.  . [DISCONTINUED] doxycycline (VIBRAMYCIN) 100 MG capsule Take 100 mg by mouth 2 (two) times daily.   No facility-administered encounter medications on file as of 02/04/2020.    Allergies  Allergen Reactions  . Penicillins Other (See Comments)    Pt. States he "passed out"    Review of Systems  Constitutional: Negative for activity change, appetite change, chills, diaphoresis, fatigue, fever and unexpected weight change.  HENT: Negative.   Eyes: Negative.   Respiratory: Negative for cough, chest tightness and shortness of breath.   Cardiovascular: Negative for chest pain, palpitations and leg swelling.  Gastrointestinal: Negative for abdominal pain, blood in stool, constipation, diarrhea, nausea and vomiting.  Endocrine: Negative.   Genitourinary: Negative for decreased urine volume, difficulty urinating, dysuria, frequency and urgency.  Musculoskeletal: Negative for arthralgias and myalgias.  Skin: Positive for color change and wound.  Allergic/Immunologic: Negative.     Neurological: Negative for dizziness, weakness and headaches.  Hematological: Negative.   Psychiatric/Behavioral: Negative for confusion, hallucinations, sleep disturbance and suicidal ideas.  All other systems reviewed and are negative.       Objective:  BP (!) 152/74   Pulse 77   Temp 98.6 F (37 C) (Temporal)   Resp 20   Ht '5\' 11"'  (1.803 m)   Wt 217 lb (98.4 kg)   SpO2 96%   BMI 30.27 kg/m    Wt Readings from Last 3 Encounters:  02/04/20 217 lb (98.4 kg)  01/29/20 219 lb (99.3 kg)  12/09/19 219 lb (99.3 kg)    Physical Exam Vitals and nursing note reviewed.  Constitutional:      General: He is not in acute distress.    Appearance: Normal appearance. He is well-developed and well-groomed. He is not ill-appearing, toxic-appearing or  diaphoretic.  HENT:     Head: Normocephalic and atraumatic.     Jaw: There is normal jaw occlusion.     Right Ear: Hearing normal.     Left Ear: Hearing normal.     Nose: Nose normal.     Mouth/Throat:     Lips: Pink.     Mouth: Mucous membranes are moist.     Pharynx: Oropharynx is clear. Uvula midline.  Eyes:     General: Lids are normal.     Extraocular Movements: Extraocular movements intact.     Conjunctiva/sclera: Conjunctivae normal.     Pupils: Pupils are equal, round, and reactive to light.  Neck:     Thyroid: No thyroid mass, thyromegaly or thyroid tenderness.     Vascular: No carotid bruit or JVD.     Trachea: Trachea and phonation normal.  Cardiovascular:     Rate and Rhythm: Normal rate and regular rhythm.     Chest Wall: PMI is not displaced.     Pulses: Normal pulses.          Dorsalis pedis pulses are 2+ on the right side.       Posterior tibial pulses are 2+ on the right side.     Heart sounds: Murmur present. Systolic murmur present with a grade of 2/6. No friction rub. No gallop.   Pulmonary:     Effort: Pulmonary effort is normal. No respiratory distress.     Breath sounds: Normal breath sounds. No  wheezing.  Abdominal:     General: Bowel sounds are normal. There is no distension or abdominal bruit.     Palpations: Abdomen is soft. There is no hepatomegaly or splenomegaly.     Tenderness: There is no abdominal tenderness. There is no right CVA tenderness or left CVA tenderness.     Hernia: No hernia is present.  Musculoskeletal:        General: Normal range of motion.     Cervical back: Normal range of motion and neck supple.     Right lower leg: No edema.     Left lower leg: No edema.       Feet:  Feet:     Right foot:     Skin integrity: Ulcer, skin breakdown, erythema, warmth and callus present.     Toenail Condition: Right toenails are abnormally thick.     Comments: All toes on right foot swollen with purplish-red discoloration. Lymphadenopathy:     Cervical: No cervical adenopathy.  Skin:    General: Skin is warm and dry.     Capillary Refill: Capillary refill takes less than 2 seconds.     Coloration: Skin is not cyanotic, jaundiced or pale.     Findings: Erythema and wound present. No rash.  Neurological:     General: No focal deficit present.     Mental Status: He is alert and oriented to person, place, and time.     Cranial Nerves: Cranial nerves are intact.     Sensory: Sensation is intact.     Motor: Motor function is intact.     Coordination: Coordination is intact.     Gait: Gait is intact.     Deep Tendon Reflexes: Reflexes are normal and symmetric.  Psychiatric:        Attention and Perception: Attention and perception normal.        Mood and Affect: Mood and affect normal.        Speech: Speech normal.  Behavior: Behavior normal. Behavior is cooperative.        Thought Content: Thought content normal.        Cognition and Memory: Cognition and memory normal.        Judgment: Judgment normal.          Results for orders placed or performed in visit on 12/09/19  hgba1c  Result Value Ref Range   HB A1C (BAYER DCA - WAIVED) 8.7 (H) <7.0 %        Pertinent labs & imaging results that were available during my care of the patient were reviewed by me and considered in my medical decision making.  Assessment & Plan:  Omar Todd was seen today for right foot red and painful.  Diagnoses and all orders for this visit:  Diabetic ulcer of toe of right foot associated with type 2 diabetes mellitus, with other ulcer severity ([place]) Ongoing and worsening.   Diabetic ulcer of right heel associated with type 2 diabetes mellitus, with other ulcer severity ([place]) Ongoing and worsening.  Cellulitis of right foot Ongoing and worsening.    Unable to get pt in to see wound care. Will send to ED for evaluation and treatment due to failed outpatient treatment.   Continue all other maintenance medications.  Follow up plan: To ED for evaluation and treatment, unable to get appointment with wound care today.   Continue healthy lifestyle choices, including diet (rich in fruits, vegetables, and lean proteins, and low in salt and simple carbohydrates) and exercise (at least 30 minutes of moderate physical activity daily).   The above assessment and management plan was discussed with the patient. The patient verbalized understanding of and has agreed to the management plan. Patient is aware to call the clinic if they develop any new symptoms or if symptoms persist or worsen. Patient is aware when to return to the clinic for a follow-up visit. Patient educated on when it is appropriate to go to the emergency department.   Monia Pouch, FNP-C [facility] [phone]

## 2020-02-05 ENCOUNTER — Encounter (HOSPITAL_COMMUNITY): Payer: Self-pay | Admitting: *Deleted

## 2020-02-05 ENCOUNTER — Emergency Department (HOSPITAL_COMMUNITY): Payer: Medicare Other

## 2020-02-05 ENCOUNTER — Emergency Department (HOSPITAL_COMMUNITY)
Admission: EM | Admit: 2020-02-05 | Discharge: 2020-02-05 | Disposition: A | Discharge status: Home / Self Care | Payer: Medicare Other | Attending: Emergency Medicine | Admitting: Emergency Medicine

## 2020-02-05 ENCOUNTER — Other Ambulatory Visit: Payer: Self-pay

## 2020-02-05 DIAGNOSIS — D649 Anemia, unspecified: Secondary | ICD-10-CM | POA: Diagnosis not present

## 2020-02-05 DIAGNOSIS — J449 Chronic obstructive pulmonary disease, unspecified: Secondary | ICD-10-CM | POA: Insufficient documentation

## 2020-02-05 DIAGNOSIS — E1140 Type 2 diabetes mellitus with diabetic neuropathy, unspecified: Secondary | ICD-10-CM | POA: Insufficient documentation

## 2020-02-05 DIAGNOSIS — S99921A Unspecified injury of right foot, initial encounter: Secondary | ICD-10-CM | POA: Diagnosis present

## 2020-02-05 DIAGNOSIS — Y9389 Activity, other specified: Secondary | ICD-10-CM | POA: Diagnosis not present

## 2020-02-05 DIAGNOSIS — Y998 Other external cause status: Secondary | ICD-10-CM | POA: Insufficient documentation

## 2020-02-05 DIAGNOSIS — I10 Essential (primary) hypertension: Secondary | ICD-10-CM | POA: Diagnosis not present

## 2020-02-05 DIAGNOSIS — S91301A Unspecified open wound, right foot, initial encounter: Secondary | ICD-10-CM | POA: Diagnosis not present

## 2020-02-05 DIAGNOSIS — Z79899 Other long term (current) drug therapy: Secondary | ICD-10-CM | POA: Insufficient documentation

## 2020-02-05 DIAGNOSIS — Z7982 Long term (current) use of aspirin: Secondary | ICD-10-CM | POA: Diagnosis not present

## 2020-02-05 DIAGNOSIS — S91309A Unspecified open wound, unspecified foot, initial encounter: Secondary | ICD-10-CM

## 2020-02-05 DIAGNOSIS — X58XXXA Exposure to other specified factors, initial encounter: Secondary | ICD-10-CM | POA: Diagnosis not present

## 2020-02-05 DIAGNOSIS — F17210 Nicotine dependence, cigarettes, uncomplicated: Secondary | ICD-10-CM | POA: Insufficient documentation

## 2020-02-05 DIAGNOSIS — S91101A Unspecified open wound of right great toe without damage to nail, initial encounter: Secondary | ICD-10-CM | POA: Diagnosis not present

## 2020-02-05 DIAGNOSIS — Y929 Unspecified place or not applicable: Secondary | ICD-10-CM | POA: Diagnosis not present

## 2020-02-05 DIAGNOSIS — Z7984 Long term (current) use of oral hypoglycemic drugs: Secondary | ICD-10-CM | POA: Diagnosis not present

## 2020-02-05 LAB — CBC WITH DIFFERENTIAL/PLATELET
Abs Immature Granulocytes: 0.03 10*3/uL (ref 0.00–0.07)
Basophils Absolute: 0 10*3/uL (ref 0.0–0.1)
Basophils Relative: 1 %
Eosinophils Absolute: 0.2 10*3/uL (ref 0.0–0.5)
Eosinophils Relative: 2 %
HCT: 30.6 % — ABNORMAL LOW (ref 39.0–52.0)
Hemoglobin: 8.8 g/dL — ABNORMAL LOW (ref 13.0–17.0)
Immature Granulocytes: 0 %
Lymphocytes Relative: 15 %
Lymphs Abs: 1.3 10*3/uL (ref 0.7–4.0)
MCH: 21.4 pg — ABNORMAL LOW (ref 26.0–34.0)
MCHC: 28.8 g/dL — ABNORMAL LOW (ref 30.0–36.0)
MCV: 74.3 fL — ABNORMAL LOW (ref 80.0–100.0)
Monocytes Absolute: 0.6 10*3/uL (ref 0.1–1.0)
Monocytes Relative: 7 %
Neutro Abs: 6.6 10*3/uL (ref 1.7–7.7)
Neutrophils Relative %: 75 %
Platelets: 357 10*3/uL (ref 150–400)
RBC: 4.12 MIL/uL — ABNORMAL LOW (ref 4.22–5.81)
RDW: 18.4 % — ABNORMAL HIGH (ref 11.5–15.5)
WBC: 8.7 10*3/uL (ref 4.0–10.5)
nRBC: 0 % (ref 0.0–0.2)

## 2020-02-05 LAB — COMPREHENSIVE METABOLIC PANEL
ALT: 11 U/L (ref 0–44)
AST: 13 U/L — ABNORMAL LOW (ref 15–41)
Albumin: 3.8 g/dL (ref 3.5–5.0)
Alkaline Phosphatase: 65 U/L (ref 38–126)
Anion gap: 9 (ref 5–15)
BUN: 16 mg/dL (ref 8–23)
CO2: 25 mmol/L (ref 22–32)
Calcium: 8.8 mg/dL — ABNORMAL LOW (ref 8.9–10.3)
Chloride: 98 mmol/L (ref 98–111)
Creatinine, Ser: 0.81 mg/dL (ref 0.61–1.24)
GFR calc Af Amer: >60 mL/min (ref >60)
GFR calc non Af Amer: >60 mL/min (ref >60)
Glucose, Bld: 169 mg/dL — ABNORMAL HIGH (ref 70–99)
Potassium: 4 mmol/L (ref 3.5–5.1)
Sodium: 132 mmol/L — ABNORMAL LOW (ref 135–145)
Total Bilirubin: 0.3 mg/dL (ref 0.3–1.2)
Total Protein: 7.2 g/dL (ref 6.5–8.1)

## 2020-02-05 LAB — POC OCCULT BLOOD, ED: Fecal Occult Bld: NEGATIVE

## 2020-02-05 IMAGING — DX DG FOOT COMPLETE 3+V*R*
3 series · 3 of 3 positions shown · non-contrast
Comparison: None.

CLINICAL DATA: Foot pain. Wound to the great toe and heel.

EXAM:
RIGHT FOOT COMPLETE - 3+ VIEW

[foot ap]
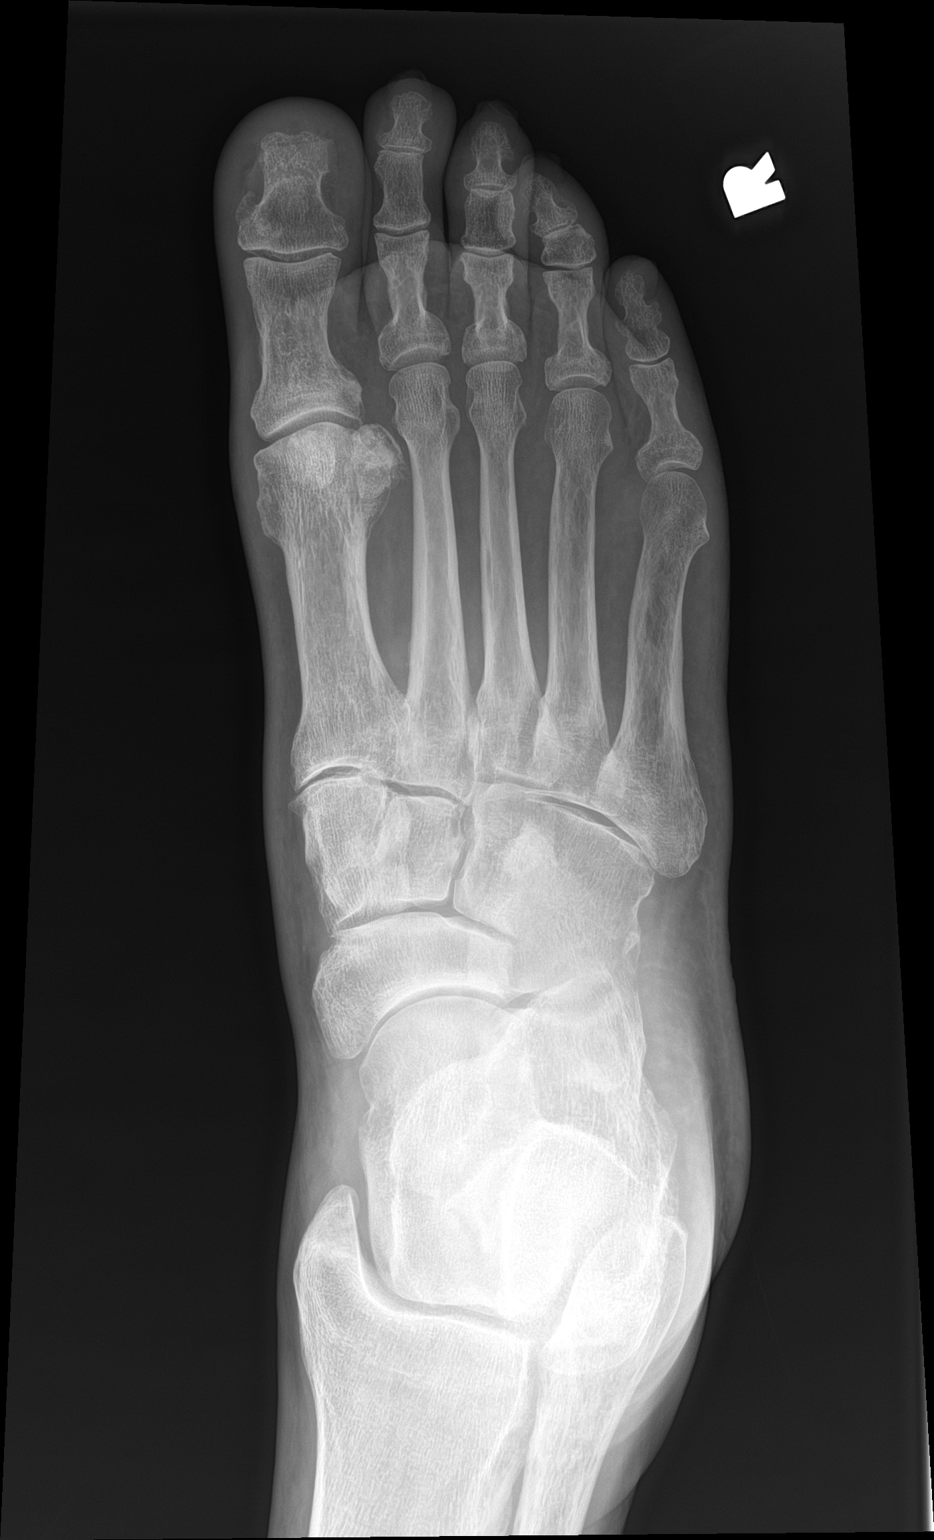

[foot obl]
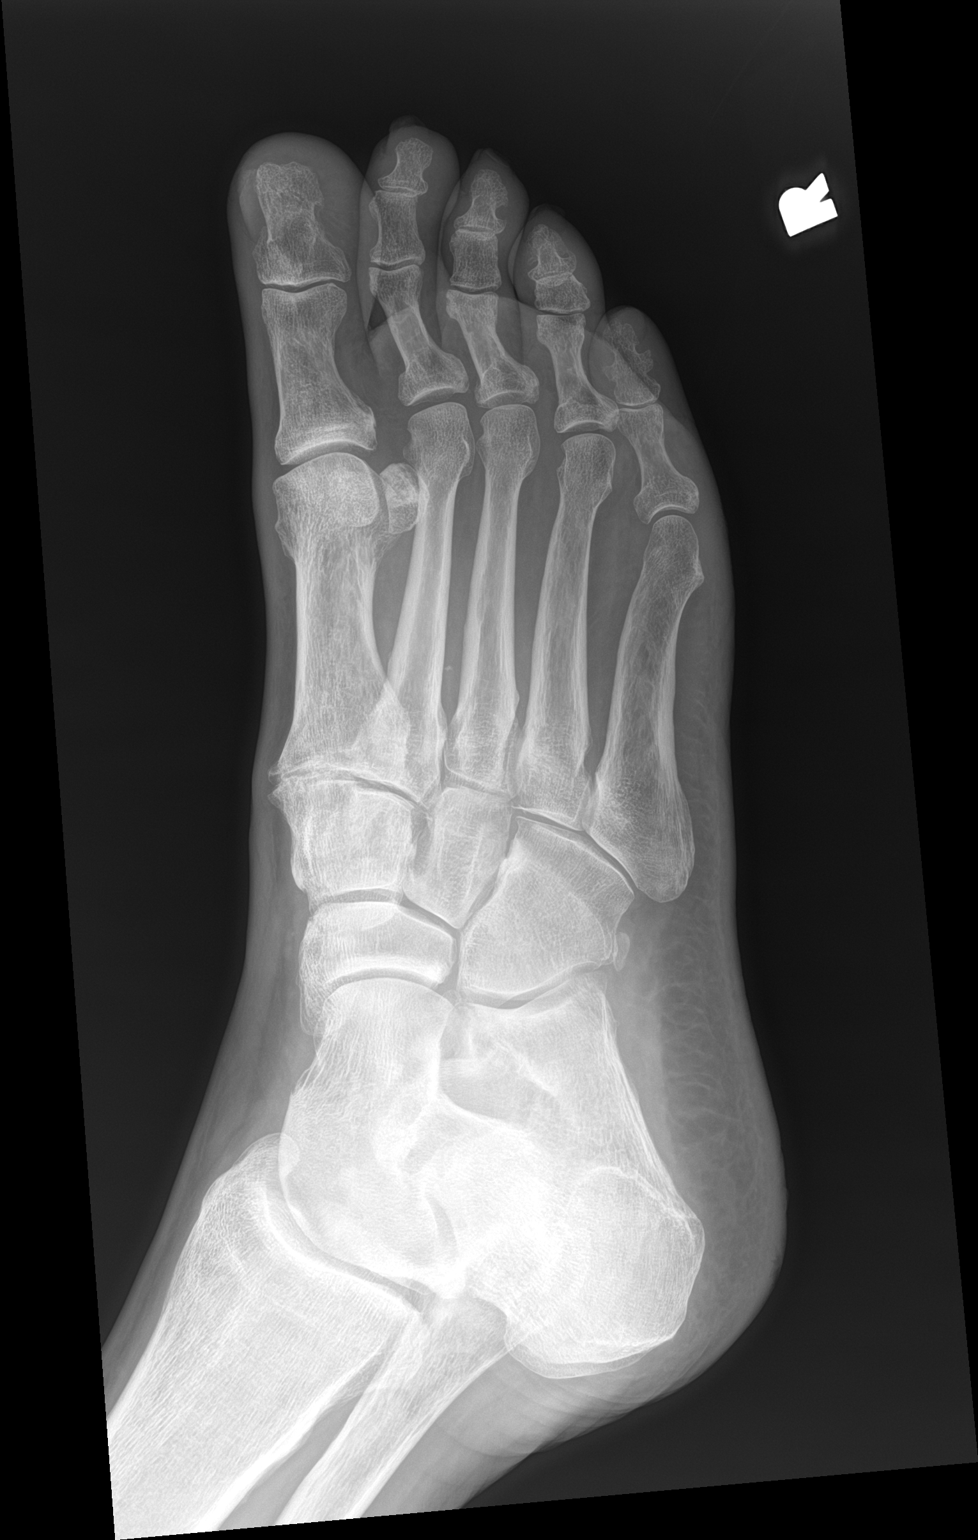

[foot lat]
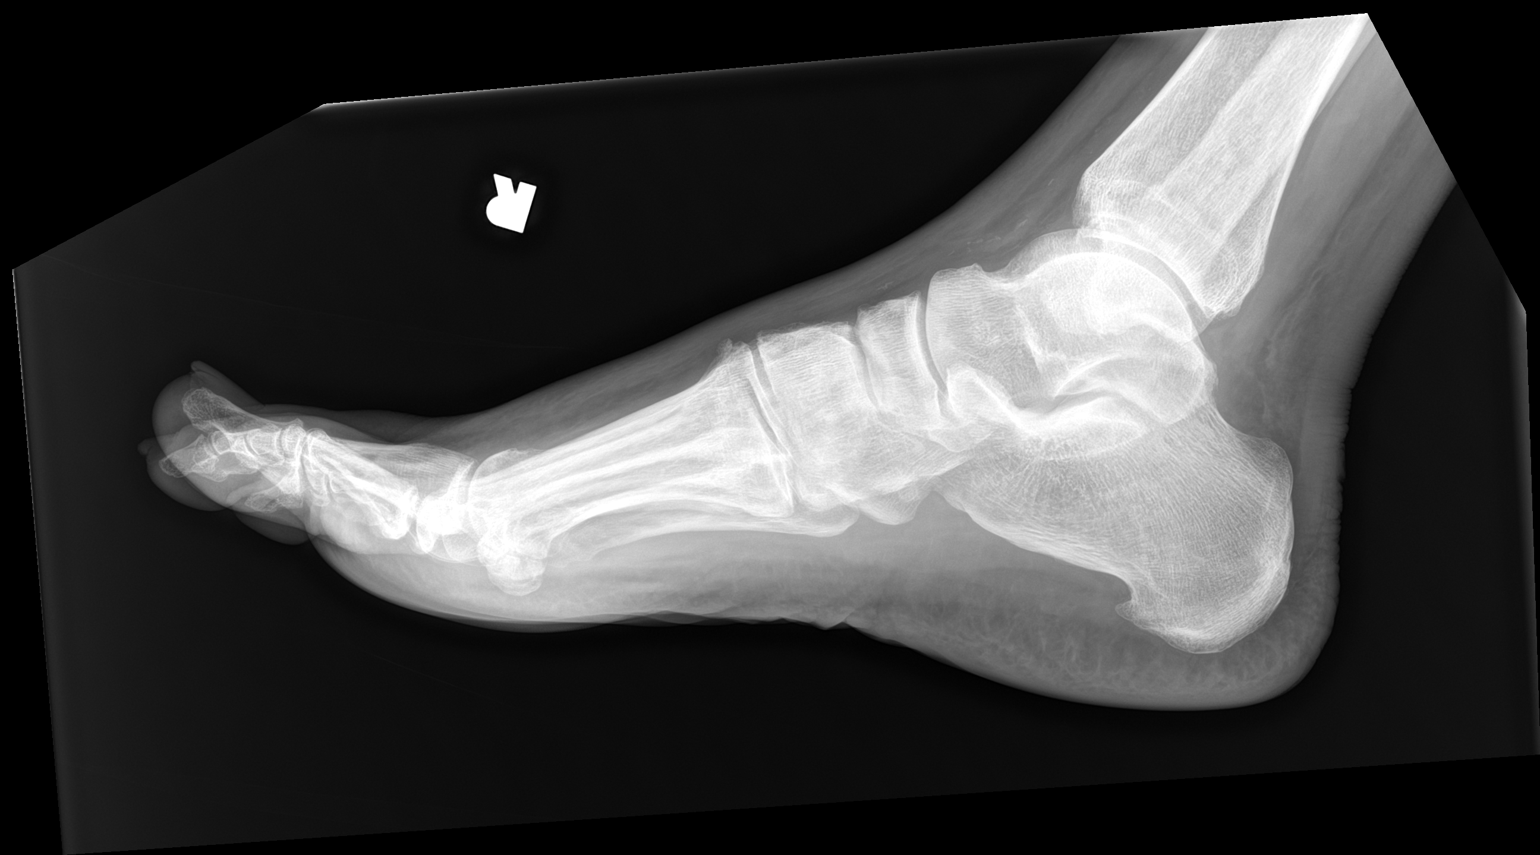

[3 of 3 positions shown; findings below may reference images not displayed]

FINDINGS: There are moderate degenerative changes at the first
metatarsophalangeal joint. There is no acute displaced fracture or
dislocation. No radiographic evidence for osteomyelitis. Vascular
calcifications are noted.
IMPRESSION: No acute osseous abnormality. Moderate degenerative changes at the
first metatarsophalangeal joint.

## 2020-02-05 MED ORDER — MUPIROCIN CALCIUM 2 % EX CREA: 1.0000 "application " | TOPICAL_CREAM | Freq: Two times a day (BID) | CUTANEOUS | 0 refills | Status: DC

## 2020-02-05 MED ORDER — FERROUS SULFATE 325 (65 FE) MG PO TABS: 325.0000 mg | ORAL_TABLET | Freq: Every day | ORAL | 0 refills | Status: DC

## 2020-02-05 NOTE — Discharge Instructions (Addendum)
As we discussed, your blood work today did show that you had some anemia.  This needs to be rechecked by your primary care doctor in about a week.  Please call their office and arrange for an appointment.  Your work-up today did not show any concerning signs for infection of the foot.  We will plan to treat the wound with topical antibiotic cream.  I have provided you a referral to podiatry.  You can take 1000 mg of Tylenol.  Do not exceed 4000 mg of Tylenol a day.  Return to the emergency department for any worsening pain, redness or swelling of foot, fevers, numbness/weakness or any other worsening concerning symptoms.  Return the emergency department for any lightheadedness, dizziness, difficulty breathing or any other worsening or concerning symptoms.

## 2020-02-05 NOTE — ED Provider Notes (Signed)
[facility] EMERGENCY DEPARTMENT Provider Note   CSN: [id number] Arrival date & time: 02/05/20  1209     History Chief Complaint  Patient presents with  . Foot Pain    Omar ACKERMAN is a 69 y.o. male possible history of COPD, diabetes, GERD, hyperlipidemia, hypertension who presents for evaluation of right foot pain x2 months.  He reports that pain initially began about 2 months ago after he fell down some stairs.  He states that since then, he has had pain diffusely to the foot.  He states that he also noticed some wound noted to the great toe as well as the fourth toe.  He states that he saw podiatry about a month ago.  He was given some pills which he thinks may have been antibiotic and was told to apply some cream to the toe.  He states that he has continued to have pain, prompting ED visit.  He does state that sometimes it gets red and swollen.  He has not noted any fevers.  He denies any numbness/weakness.  He does have history of diabetes.  He states he is compliant with his medications.  The history is provided by the patient.       Past Medical History:  Diagnosis Date  . COPD (chronic obstructive pulmonary disease) ([place])   . Diabetes mellitus without complication ([place])   . GERD (gastroesophageal reflux disease)   . Hyperlipidemia   . Hypertension     Patient Active Problem List   Diagnosis Date Noted  . BPH (benign prostatic hyperplasia) 10/31/2014  . Systolic ejection murmur 27/25/3664  . Bilateral carotid bruits 10/31/2014  . Erectile dysfunction 11/05/2013  . Bilateral carotid artery disease ([place]) 04/11/2013  . Hyperlipidemia associated with type 2 diabetes mellitus ([place]) 04/11/2013  . Hypertension associated with diabetes ([place]) 04/11/2013  . COPD exacerbation ([place]) 04/11/2013  . Diabetes mellitus type 2 controlled 04/11/2013  . Diabetic neuropathy, type II diabetes mellitus ([place]) 03/15/2013    Past Surgical History:  Procedure Laterality Date  . Thumb  surgery         Family History  Problem Relation Age of Onset  . Cancer Father   . Diabetes Sister   . Stroke Brother   . Healthy Sister   . Healthy Sister     Social History   Tobacco Use  . Smoking status: Current Every Day Smoker    Packs/day: 0.50    Years: 60.00    Pack years: 30.00    Types: Cigarettes  . Smokeless tobacco: Never Used  Substance Use Topics  . Alcohol use: No    Alcohol/week: 0.0 standard drinks  . Drug use: No    Home Medications Prior to Admission medications   Medication Sig Start Date End Date Taking? Authorizing Provider  amLODipine (NORVASC) 10 MG tablet TAKE ONE (1) TABLET EACH DAY 08/21/19  Yes Ronnie Doss M, DO  aspirin EC 81 MG tablet Take 81 mg by mouth daily.   Yes [provider]  BIOTIN 5000 PO Take 1 capsule by mouth daily.   Yes [provider]  cholecalciferol (VITAMIN D) 1000 UNITS tablet Take 1,000 Units by mouth daily.   Yes [provider]  diclofenac (VOLTAREN) 75 MG EC tablet Take 75 mg by mouth 2 (two) times daily. 12/02/19  Yes [provider]  empagliflozin (JARDIANCE) 25 MG TABS tablet Take 25 mg by mouth daily before breakfast. 08/21/19  Yes Gottschalk, Ashly M, DO  escitalopram (LEXAPRO) 10 MG tablet  TAKE ONE (1) TABLET EACH DAY 10/01/19  Yes Gottschalk, Ashly M, DO  hydrochlorothiazide (HYDRODIURIL) 25 MG tablet TAKE ONE (1) TABLET EACH DAY 04/05/19  Yes Gottschalk, Ashly M, DO  lisinopril (ZESTRIL) 40 MG tablet TAKE ONE (1) TABLET EACH DAY 08/21/19  Yes Gottschalk, Ashly M, DO  meloxicam (MOBIC) 7.5 MG tablet TAKE ONE TABLET BY MOUTH TWICE DAILY 12/13/19  Yes Gottschalk, Ashly M, DO  metFORMIN (GLUCOPHAGE) 1000 MG tablet TAKE ONE TABLET TWICE A DAY WITH FOOD 04/05/19  Yes Gottschalk, Ashly M, DO  metoprolol tartrate (LOPRESSOR) 25 MG tablet Take 1 tablet (25 mg total) by mouth 2 (two) times daily. 04/05/19  Yes Gottschalk, Ashly M, DO  montelukast (SINGULAIR) 10 MG tablet TAKE ONE TABLET  DAILY AT BEDTIME 01/07/20  Yes Gottschalk, Ashly M, DO  omeprazole (PRILOSEC) 20 MG capsule TAKE ONE (1) CAPSULE EACH DAY 12/13/19  Yes Gottschalk, Ashly M, DO  pravastatin (PRAVACHOL) 40 MG tablet TAKE ONE (1) TABLET EACH DAY 08/21/19  Yes Gottschalk, Ashly M, DO  [name] (JANUVIA) 100 MG tablet Take 1 tablet (100 mg total) by mouth daily. 01/29/20  Yes Gottschalk, Leatrice Jewels M, DO  Accu-Chek FastClix Lancets MISC 3 TIMES DAILY Patient not taking: Reported on 02/05/2020 05/16/19   [name], DO  blood glucose meter kit and supplies Check BS TID and PRN dx.E11.9 Patient not taking: Reported on 02/05/2020 04/04/18   Chipper Herb, MD  ferrous sulfate 325 (65 FE) MG tablet Take 1 tablet (325 mg total) by mouth daily. 02/05/20   [place]idence Lanius A, PA-C  glucose blood (ACCU-CHEK GUIDE) test strip Check blood sugar 1-3 times daily as directed.  Dx E11.65 Patient not taking: Reported on 02/05/2020 12/09/19   [name], DO  mupirocin cream (BACTROBAN) 2 % Apply 1 application topically 2 (two) times daily. 02/05/20   Volanda Napoleon, PA-C    Allergies    Penicillins  Review of Systems   Review of Systems  Constitutional: Negative for fever.  Skin: Positive for color change and wound.  Neurological: Negative for weakness and numbness.  All other systems reviewed and are negative.   Physical Exam Updated Vital Signs BP (!) 153/71   Pulse 73   Temp 99.2 F (37.3 C) (Oral)   Resp 18   Ht _0  (1.88 m)   Wt 96.2 kg   SpO2 93%   BMI 27.22 kg/m   Physical Exam Vitals and nursing note reviewed. Exam conducted with a chaperone present.  Constitutional:      Appearance: He is well-developed.  HENT:     Head: Normocephalic and atraumatic.  Eyes:     General: No scleral icterus.       Right eye: No discharge.        Left eye: No discharge.     Conjunctiva/sclera: Conjunctivae normal.  Cardiovascular:     Comments: Difficulty assessing DP pulses bilaterally with palpation.   Easily obtainable Doppler pulses. Pulmonary:     Effort: Pulmonary effort is normal.  Genitourinary:    Rectum: Guaiac result negative.     Comments: The exam was performed with a chaperone present. Normal male genitalia. No evidence of rash, ulcers or lesions. Digital Rectal Exam reveals sphincter with good tone. No external hemorrhoids. No masses or fissures. Stool color is brown with no overt blood. Skin:    General: Skin is warm and dry.     Comments: He has an area of skin breakdown noted in the IP of the  right fourth toe.  No active drainage.  No surrounding warmth, erythema.  His first toe on the right foot has an area where the nail has been broken off and has some overlying onychomycosis.  No surrounding warmth, erythema. Good distal cap refill.  BLE is not dusky in appearance or cool to touch.  Neurological:     Mental Status: He is alert.  Psychiatric:        Speech: Speech normal.        Behavior: Behavior normal.     ED Results / Procedures / Treatments   Labs (all labs ordered are listed, but only abnormal results are displayed) Labs Reviewed  CBC WITH DIFFERENTIAL/PLATELET - Abnormal; Notable for the following components:      Result Value   RBC 4.12 (*)    Hemoglobin 8.8 (*)    HCT 30.6 (*)    MCV 74.3 (*)    MCH 21.4 (*)    MCHC 28.8 (*)    RDW 18.4 (*)    All other components within normal limits  COMPREHENSIVE METABOLIC PANEL - Abnormal; Notable for the following components:   Sodium 132 (*)    Glucose, Bld 169 (*)    Calcium 8.8 (*)    AST 13 (*)    All other components within normal limits  POC OCCULT BLOOD, ED    EKG None  Radiology DG Foot Complete Right  Result Date: 02/05/2020 CLINICAL DATA:  Foot pain. Wound to the great toe and heel. EXAM: RIGHT FOOT COMPLETE - 3+ VIEW COMPARISON:  None. FINDINGS: There are moderate degenerative changes at the first metatarsophalangeal joint. There is no acute displaced fracture or dislocation. No radiographic  evidence for osteomyelitis. Vascular calcifications are noted. IMPRESSION: No acute osseous abnormality. Moderate degenerative changes at the first metatarsophalangeal joint. Electronically Signed   By: Constance Holster M.D.   On: 02/05/2020 15:49    Procedures Procedures (including critical care time)  Medications Ordered in ED Medications - No data to display  ED Course  I have reviewed the triage vital signs and the nursing notes.  Pertinent labs & imaging results that were available during my care of the patient were reviewed by me and considered in my medical decision making (see chart for details).    MDM Rules/Calculators/A&P                      69 year old male who presents for evaluation of right foot pain.  Reports initially began after falling about 2 months ago.  Since then has had pain to his first toe as well as the bottom part of his foot.  Saw podiatry about a month ago and thinks he had been on some antibiotics.  No fevers, numbness/weakness.  No warmth, erythema. Patient is afebrile, non-toxic appearing, sitting comfortably on examination table. Vital signs reviewed and stable.  On exam, the IP joint of the fourth toe has some breaking and opening of the wound.  There is no active drainage.  He also has area of onychomycosis noted.  Low suspicion for infection but will plan for labs, x-ray.  History/physical exam not concerning for ischemic limb, DVT.  CMP shows sodium of 132.  BUN and creatinine within normal limits.  CBC shows no leukocytosis.  Hemoglobin is 8.8.  Previous hemoglobin from about a year ago was 11.7.  Patient is on aspirin but no other blood thinners.  He denies any black or tarry stools.  X-ray of foot shows no  acute bony abnormality.  There is moderate degenerative changes noted at the first metatarsal joint.  Exam showed no evidence of fecal occult blood.  He does have a low MCV so question of this is iron deficiency anemia.  At this time, I  suspect the wounds are mostly result of poor healing rather than acute infectious process.  We will plan to send him home on topical antibiotic cream.  We will plan to give outpatient podiatry referral.  Additionally, I discussed with patient that he will need to follow-up with his primary care doctor regarding his anemia. At this time, patient exhibits no emergent life-threatening condition that require further evaluation in ED or admission. Patient had ample opportunity for questions and discussion. All patient's questions were answered with full understanding. Strict return precautions discussed. Patient expresses understanding and agreement to plan.   Portions of this note were generated with Lobbyist. Dictation errors may occur despite best attempts at proofreading.   Final Clinical Impression(s) / ED Diagnoses Final diagnoses:  Wound of foot  Anemia, unspecified type    Rx / DC Orders ED Discharge Orders         Ordered    mupirocin cream (BACTROBAN) 2 %  2 times daily     02/05/20 1610    ferrous sulfate 325 (65 FE) MG tablet  Daily     02/05/20 1627           Desma Mcgregor 02/05/20 2029    Noemi Chapel, MD 02/06/20 [phone]

## 2020-02-05 NOTE — ED Provider Notes (Signed)
On exam this patient has what appears to be a deep skin tear at the crease of the right fourth toe.  There is no surrounding redness tenderness drainage or pus.  He otherwise appears well.  His hemoglobin has dropped 2-1/2 g over the last year.  He will need a rectal exam to rule out gastrointestinal bleeding, the patient does take a baby aspirin but otherwise appears well, he is ambulatory, I saw him ambulate with normal gait.  Vital signs show mild hypertension, otherwise no acute distress.  Medical screening examination/treatment/procedure(s) were conducted as a shared visit with non-physician practitioner(s) and myself.  I personally evaluated the patient during the encounter.  Clinical Impression:   Final diagnoses:  Wound of foot  Anemia, unspecified type         Noemi Chapel, MD 02/06/20 [phone]

## 2020-02-05 NOTE — ED Triage Notes (Signed)
Pt c/o pain to right foot; pt states he fell x 2 months ago and has been having pain since then; pt has a wound to his great toe and his heel

## 2020-02-13 ENCOUNTER — Other Ambulatory Visit: Payer: Self-pay | Admitting: Podiatry

## 2020-02-13 ENCOUNTER — Other Ambulatory Visit: Payer: Self-pay

## 2020-02-13 ENCOUNTER — Ambulatory Visit (INDEPENDENT_AMBULATORY_CARE_PROVIDER_SITE_OTHER): Payer: Medicare Other | Admitting: Podiatry

## 2020-02-13 ENCOUNTER — Telehealth: Payer: Self-pay | Admitting: *Deleted

## 2020-02-13 ENCOUNTER — Ambulatory Visit (INDEPENDENT_AMBULATORY_CARE_PROVIDER_SITE_OTHER): Payer: Medicare Other

## 2020-02-13 DIAGNOSIS — B351 Tinea unguium: Secondary | ICD-10-CM

## 2020-02-13 DIAGNOSIS — L97512 Non-pressure chronic ulcer of other part of right foot with fat layer exposed: Secondary | ICD-10-CM

## 2020-02-13 DIAGNOSIS — Z720 Tobacco use: Secondary | ICD-10-CM

## 2020-02-13 DIAGNOSIS — I739 Peripheral vascular disease, unspecified: Secondary | ICD-10-CM | POA: Diagnosis not present

## 2020-02-13 DIAGNOSIS — L97411 Non-pressure chronic ulcer of right heel and midfoot limited to breakdown of skin: Secondary | ICD-10-CM

## 2020-02-13 DIAGNOSIS — L97412 Non-pressure chronic ulcer of right heel and midfoot with fat layer exposed: Secondary | ICD-10-CM

## 2020-02-13 DIAGNOSIS — E1169 Type 2 diabetes mellitus with other specified complication: Secondary | ICD-10-CM

## 2020-02-13 DIAGNOSIS — M2041 Other hammer toe(s) (acquired), right foot: Secondary | ICD-10-CM

## 2020-02-13 DIAGNOSIS — M79604 Pain in right leg: Secondary | ICD-10-CM

## 2020-02-13 DIAGNOSIS — M79605 Pain in left leg: Secondary | ICD-10-CM

## 2020-02-13 DIAGNOSIS — E1151 Type 2 diabetes mellitus with diabetic peripheral angiopathy without gangrene: Secondary | ICD-10-CM

## 2020-02-13 MED ORDER — SANTYL 250 UNIT/GM EX OINT: 1.0000 "application " | TOPICAL_OINTMENT | Freq: Every day | CUTANEOUS | 5 refills | Status: DC

## 2020-02-13 NOTE — Progress Notes (Signed)
  Subjective:  [name], male    DOB: Apr 03, 1951,  MRN: [id number]  Chief Complaint  Patient presents with  . Foot Pain    pt is here for right foot pain toe pain, toes 1-5, pt states that it is painful to the touch. Pt is also concerned about possible infection as well.   69 y.o. male presents for wound care. Hx confirmed with patient.  Objective:  Physical Exam: Wound Location: Right hallux Wound Measurement: 1.5x1 Wound Base: Fibrotic slough Peri-wound: Normal Exudate: None: wound tissue dry wound without warmth, erythema, signs of acute infection  Wound Location: 4th toe sulcus Wound Measurement: 0.5x1 Wound Base: fibrotic Peri-wound: Normal Exudate: None: wound tissue dry wound without warmth, erythema, signs of acute infection  Wound Location: R heel  Wound Measurement: 1x1 Wound Base: Granular/Healthy Peri-wound: Calloused Exudate: None: wound tissue dry wound without warmth, erythema, signs of acute infection  No images are attached to the encounter.  Radiographs:  X-ray of the right foot: no soft tissue emphysema, no signs of osteomyelitis Assessment:   1. Ulcer of great toe, right, with fat layer exposed ([place])   2. Ulcer of heel, right, with fat layer exposed ([place])   3. Ulcer of right midfoot limited to breakdown of skin ([place])   4. PAD (peripheral artery disease) (H[facility])   5. Pain in both lower extremities   6. Onychomycosis of multiple toenails with type 2 diabetes mellitus and peripheral angiopathy ([place])   7. Tobacco abuse     Plan:  Patient was evaluated and treated and all questions answered.  Ulcer of Right Great Toe, Right Heel, Right 4th toe Sulcus -XR reviewed with patient -Dressing applied consisting of silvadene and band-aid -Offload ulcer with surgical shoe -Surgical shoe dispensed -Wound cleansed and debrided at posterior heel -Order santyl -No signs of acute infection noted  Procedure: Selective Debridement of  Wound Rationale: Removal of devitalized tissue from the wound to promote healing.  Pre-Debridement Wound Measurements:  cm x 1 cm x 0.2 cm  Post-Debridement Wound Measurements: same as pre-debridement. Type of Debridement: sharp selective Tissue Removed: Devitalized soft-tissue Dressing: Dry, sterile, compression dressing. Disposition: Patient tolerated procedure well. Patient to return in 1 week for follow-up.  PAD -Pain at night likely ischemic -Order Arterials -Discussed smoking cessation  Onychomycosis, DM, PAD -Nails debrided x10  Return in about 2 weeks (around 02/27/2020) for Wound Care.

## 2020-02-13 NOTE — Telephone Encounter (Signed)
[facility] in [place] states will need the measurements.

## 2020-02-13 NOTE — Telephone Encounter (Signed)
-----   Message from Evelina Bucy, DPM sent at 02/13/2020  2:51 PM EDT ----- Can we order santyl?Also order arterial studies?

## 2020-02-13 NOTE — Telephone Encounter (Signed)
I informed [facility] that we were sending Santyl prescription to [facility] for special PA.

## 2020-02-14 ENCOUNTER — Encounter: Payer: Self-pay | Admitting: Podiatry

## 2020-02-14 MED ORDER — SANTYL 250 UNIT/GM EX OINT: 1.0000 "application " | TOPICAL_OINTMENT | Freq: Every day | CUTANEOUS | 5 refills | Status: DC

## 2020-02-28 ENCOUNTER — Encounter: Payer: Self-pay | Admitting: Cardiovascular Disease

## 2020-02-28 ENCOUNTER — Ambulatory Visit (INDEPENDENT_AMBULATORY_CARE_PROVIDER_SITE_OTHER): Payer: Medicare Other | Admitting: Cardiovascular Disease

## 2020-02-28 ENCOUNTER — Encounter: Payer: Self-pay | Admitting: Podiatry

## 2020-02-28 ENCOUNTER — Other Ambulatory Visit: Payer: Self-pay

## 2020-02-28 ENCOUNTER — Ambulatory Visit (INDEPENDENT_AMBULATORY_CARE_PROVIDER_SITE_OTHER): Payer: Medicare Other | Admitting: Podiatry

## 2020-02-28 ENCOUNTER — Ambulatory Visit (HOSPITAL_COMMUNITY)
Admission: RE | Admit: 2020-02-28 | Discharge: 2020-02-28 | Disposition: A | Discharge status: Home / Self Care | Payer: Medicare Other | Source: Ambulatory Visit | Attending: Cardiovascular Disease | Admitting: Cardiovascular Disease

## 2020-02-28 DIAGNOSIS — L97512 Non-pressure chronic ulcer of other part of right foot with fat layer exposed: Secondary | ICD-10-CM | POA: Diagnosis not present

## 2020-02-28 DIAGNOSIS — L97412 Non-pressure chronic ulcer of right heel and midfoot with fat layer exposed: Secondary | ICD-10-CM

## 2020-02-28 DIAGNOSIS — R011 Cardiac murmur, unspecified: Secondary | ICD-10-CM | POA: Diagnosis not present

## 2020-02-28 DIAGNOSIS — L97411 Non-pressure chronic ulcer of right heel and midfoot limited to breakdown of skin: Secondary | ICD-10-CM

## 2020-02-28 DIAGNOSIS — E1169 Type 2 diabetes mellitus with other specified complication: Secondary | ICD-10-CM | POA: Diagnosis not present

## 2020-02-28 DIAGNOSIS — E1159 Type 2 diabetes mellitus with other circulatory complications: Secondary | ICD-10-CM

## 2020-02-28 DIAGNOSIS — Z720 Tobacco use: Secondary | ICD-10-CM | POA: Diagnosis not present

## 2020-02-28 DIAGNOSIS — E785 Hyperlipidemia, unspecified: Secondary | ICD-10-CM

## 2020-02-28 DIAGNOSIS — I739 Peripheral vascular disease, unspecified: Secondary | ICD-10-CM

## 2020-02-28 DIAGNOSIS — J441 Chronic obstructive pulmonary disease with (acute) exacerbation: Secondary | ICD-10-CM

## 2020-02-28 DIAGNOSIS — I10 Essential (primary) hypertension: Secondary | ICD-10-CM

## 2020-02-28 DIAGNOSIS — I6523 Occlusion and stenosis of bilateral carotid arteries: Secondary | ICD-10-CM | POA: Diagnosis not present

## 2020-02-28 DIAGNOSIS — Z9889 Other specified postprocedural states: Secondary | ICD-10-CM

## 2020-02-28 DIAGNOSIS — I714 Abdominal aortic aneurysm, without rupture, unspecified: Secondary | ICD-10-CM

## 2020-02-28 DIAGNOSIS — I152 Hypertension secondary to endocrine disorders: Secondary | ICD-10-CM

## 2020-02-28 DIAGNOSIS — I70229 Atherosclerosis of native arteries of extremities with rest pain, unspecified extremity: Secondary | ICD-10-CM

## 2020-02-28 DIAGNOSIS — Z959 Presence of cardiac and vascular implant and graft, unspecified: Secondary | ICD-10-CM

## 2020-02-28 DIAGNOSIS — I998 Other disorder of circulatory system: Secondary | ICD-10-CM | POA: Diagnosis not present

## 2020-02-28 NOTE — Assessment & Plan Note (Signed)
History of essential pretension blood pressure measured today 138/66.  He is on amlodipine, hydrochlorothiazide, lisinopril and metoprolol.

## 2020-02-28 NOTE — Assessment & Plan Note (Signed)
Wounds on right foot for last 5 months currently being treated by Dr. Hardie Pulley at 5 foot.  His Doppler shows severe aortoiliac and SFA disease..  We will proceed with angiography potential endovascular therapy.

## 2020-02-28 NOTE — Patient Instructions (Addendum)
Medication Instructions:  Your physician recommends that you continue on your current medications as directed. Please refer to the Current Medication list given to you today.  *If you need a refill on your cardiac medications before your next appointment, please call your pharmacy*   Lab Work: BMET, CBC TODAY If you have labs (blood work) drawn today and your tests are completely normal, you will receive your results only by: Marland Kitchen MyChart Message (if you have MyChart) OR . A paper copy in the mail If you have any lab test that is abnormal or we need to change your treatment, we will call you to review the results.  COVID Thursday 4/22 at 11:45 AM  Testing/Procedures: Your physician has requested that you have an echocardiogram next week. Echocardiography is a painless test that uses sound waves to create images of your heart. It provides your doctor with information about the size and shape of your heart and how well your heart's chambers and valves are working. This procedure takes approximately one hour. There are no restrictions for this procedure.  Your physician has requested that you have a carotid duplex next week. This test is an ultrasound of the carotid arteries in your neck. It looks at blood flow through these arteries that supply the brain with blood. Allow one hour for this exam. There are no restrictions or special instructions.  Your physician has requested that you have a peripheral vascular angiogram. This exam is performed at the hospital. During this exam IV contrast is used to look at arterial blood flow. Please review the information sheet given for details.  Your physician has requested that you have an ankle brachial index (ABI) 1 WEEK AFTER PROCEDURE. During this test an ultrasound and blood pressure cuff are used to evaluate the arteries that supply the arms and legs with blood. Allow thirty minutes for this exam. There are no restrictions or special instructions.  Your  physician has requested that you have a lower extremity arterial duplex (1 WEEK AFTER PROCEDURE). This test is an ultrasound of the arteries in the legs. It looks at arterial blood flow in the legs. Allow one hour for Lower Arterial scans. There are no restrictions or special instructions  Follow-Up: At [facility], you and your health needs are our priority.  As part of our continuing mission to provide you with exceptional heart care, we have created designated Provider Care Teams.  These Care Teams include your primary Cardiologist (physician) and Advanced Practice Providers (APPs -  Physician Assistants and Nurse Practitioners) who all work together to provide you with the care you need, when you need it.  We recommend signing up for the patient portal called "MyChart".  Sign up information is provided on this After Visit Summary.  MyChart is used to connect with patients for Virtual Visits (Telemedicine).  Patients are able to view lab/test results, encounter notes, upcoming appointments, etc.  Non-urgent messages can be sent to your provider as well.   To learn more about what you can do with MyChart, go to [email or web address].    Your next appointment:   2 week(s) after procedure  The format for your next appointment:   In Person  Provider:   Quay Burow, MD

## 2020-02-28 NOTE — Assessment & Plan Note (Signed)
History of hyperlipidemia on statin therapy with lipid profile performed 08/08/2018 revealing LDL of 123.  We will repeat lipid and liver profile.  He is on pravastatin 40 mg a day.

## 2020-02-28 NOTE — Assessment & Plan Note (Addendum)
History of COPD with ongoing tobacco abuse

## 2020-02-28 NOTE — Addendum Note (Signed)
Addended by: Patria Mane A on: 02/28/2020 04:05 PM   Modules accepted: Orders

## 2020-02-28 NOTE — Assessment & Plan Note (Signed)
History of abdominal ache aneurysm with ultrasound today measuring 5 cm not previously measured.  This will need to be followed closely with anticipation of referral to vascular surgery for consideration of revascularization

## 2020-02-28 NOTE — Progress Notes (Signed)
02/28/2020 Omar Todd   1951-05-14  [id number]  Primary Physician Omar Norlander, DO Primary Cardiologist: Omar Harp MD Omar Todd, [place]  HPI:  Omar Todd is a 69 y.o. married Caucasian male father of 3 children is accompanied by his Sister Omar Todd today.  He was referred by Dr. March Todd for cardiac evaluation because of critical limb ischemia.  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  He seen Dr. Percival Todd back in 2017 for hypertension.  His risk factors include tobacco abuse smoking 1/2 pack a day for last 55 years, treated hypertension, diabetes and hyperlipidemia.  His mother did die of a myocardial infarction at age 42.  She never had a attack or stroke.  He denies chest pain or shortness of breath.  Does have COPD.  He has had nonhealing ulcers on his right foot for the last 5 months currently followed by Dr. March Todd.  Dopplers in our office today revealed a 5 cm abdominal uric aneurysm, severe iliac disease and SFA disease.  He will require angiography and potential endovascular therapy for limb salvage.   Current Meds  Medication Sig  . Accu-Chek FastClix Lancets MISC 3 TIMES DAILY  . amLODipine (NORVASC) 10 MG tablet TAKE ONE (1) TABLET EACH DAY  . aspirin EC 81 MG tablet Take 81 mg by mouth daily.  Marland Kitchen BIOTIN 5000 PO Take 1 capsule by mouth daily.  . blood glucose meter kit and supplies Check BS TID and PRN dx.E11.9  . cholecalciferol (VITAMIN D) 1000 UNITS tablet Take 1,000 Units by mouth daily.  . collagenase (SANTYL) ointment Apply 1 application topically daily. Ulcers to right 4th sulcus, right 1st toe and right heel measurements sent on [place] form.  . diclofenac (VOLTAREN) 75 MG EC tablet Take 75 mg by mouth 2 (two) times daily.  . empagliflozin (JARDIANCE) 25 MG TABS tablet Take 25 mg by mouth daily before breakfast.  . escitalopram (LEXAPRO) 10 MG tablet TAKE ONE (1) TABLET EACH DAY  . ferrous sulfate  325 (65 FE) MG tablet Take 1 tablet (325 mg total) by mouth daily.  Marland Kitchen glucose blood (ACCU-CHEK GUIDE) test strip Check blood sugar 1-3 times daily as directed.  Dx E11.65  . hydrochlorothiazide (HYDRODIURIL) 25 MG tablet TAKE ONE (1) TABLET EACH DAY  . lisinopril (ZESTRIL) 40 MG tablet TAKE ONE (1) TABLET EACH DAY  . meloxicam (MOBIC) 7.5 MG tablet TAKE ONE TABLET BY MOUTH TWICE DAILY  . metFORMIN (GLUCOPHAGE) 1000 MG tablet TAKE ONE TABLET TWICE A DAY WITH FOOD  . metoprolol tartrate (LOPRESSOR) 25 MG tablet Take 1 tablet (25 mg total) by mouth 2 (two) times daily.  . montelukast (SINGULAIR) 10 MG tablet TAKE ONE TABLET DAILY AT BEDTIME  . mupirocin cream (BACTROBAN) 2 % Apply 1 application topically 2 (two) times daily.  . mupirocin ointment (BACTROBAN) 2 % Apply 1 application topically 3 (three) times daily.  Marland Kitchen omeprazole (PRILOSEC) 20 MG capsule TAKE ONE (1) CAPSULE EACH DAY  . pravastatin (PRAVACHOL) 40 MG tablet TAKE ONE (1) TABLET EACH DAY  . [name] (JANUVIA) 100 MG tablet Take 1 tablet (100 mg total) by mouth daily.     Allergies  Allergen Reactions  . Penicillins Other (See Comments)    Pt. States he "passed out"    Social History   Socioeconomic History  . Marital status: Married    Spouse name: Omar Todd  . Number of children: 3  .  Years of education: 9th Grade  . Highest education level: 9th grade  Occupational History  . Occupation: Retired    Comment: Science writer  Tobacco Use  . Smoking status: Current Every Day Smoker    Packs/day: 0.50    Years: 60.00    Pack years: 30.00    Types: Cigarettes  . Smokeless tobacco: Never Used  Substance and Sexual Activity  . Alcohol use: No    Alcohol/week: 0.0 standard drinks  . Drug use: No  . Sexual activity: Yes    Birth control/protection: None  Other Topics Concern  . Not on file  Social History Narrative  . Not on file   Social Determinants of Health   Financial Resource Strain: Low Risk   .  Difficulty of Paying Living Expenses: Not very hard  Food Insecurity: No Food Insecurity  . Worried About Charity fundraiser in the Last Year: Never true  . Ran Out of Food in the Last Year: Never true  Transportation Needs: No Transportation Needs  . Lack of Transportation (Medical): No  . Lack of Transportation (Non-Medical): No  Physical Activity: Inactive  . Days of Exercise per Week: 0 days  . Minutes of Exercise per Session: 0 min  Stress: No Stress Concern Present  . Feeling of Stress : Only a little  Social Connections: Not Isolated  . Frequency of Communication with Friends and Family: More than three times a week  . Frequency of Social Gatherings with Friends and Family: More than three times a week  . Attends Religious Services: More than 4 times per year  . Active Member of Clubs or Organizations: Yes  . Attends Archivist Meetings: More than 4 times per year  . Marital Status: Married  Human resources officer Violence: Not At Risk  . Fear of Current or Ex-Partner: No  . Emotionally Abused: No  . Physically Abused: No  . Sexually Abused: No     Review of Systems: General: negative for chills, fever, night sweats or weight changes.  Cardiovascular: negative for chest pain, dyspnea on exertion, edema, orthopnea, palpitations, paroxysmal nocturnal dyspnea or shortness of breath Dermatological: negative for rash Respiratory: negative for cough or wheezing Urologic: negative for hematuria Abdominal: negative for nausea, vomiting, diarrhea, bright red blood per rectum, melena, or hematemesis Neurologic: negative for visual changes, syncope, or dizziness All other systems reviewed and are otherwise negative except as noted above.    Blood pressure 138/66, pulse 87, height '6\' 2"'  (1.88 m), weight 217 lb (98.4 kg), SpO2 97 %.  General appearance: alert and no distress Neck: no adenopathy, no JVD, supple, symmetrical, trachea midline, thyroid not enlarged, symmetric, no  tenderness/mass/nodules and Bilateral carotid bruits Lungs: clear to auscultation bilaterally Heart: 2/6 systolic ejection murmur the base consistent with aortic stenosis Extremities: extremities normal, atraumatic, no cyanosis or edema Pulses: Diminished pedal pulses bilaterally Skin: Right foot is bandaged Neurologic: Alert and oriented X 3, normal strength and tone. Normal symmetric reflexes. Normal coordination and gait  EKG sinus rhythm 87 with nonspecific ST and T wave changes.  I personally reviewed this EKG.  ASSESSMENT AND PLAN:   Bilateral carotid artery disease History of moderate bilateral ICA stenosis by duplex ultrasound 08/27/2016.  We will repeat carotid Doppler studies.  Hyperlipidemia associated with type 2 diabetes mellitus ([place]) History of hyperlipidemia on statin therapy with lipid profile performed 08/08/2018 revealing LDL of 123.  We will repeat lipid and liver profile.  He is on pravastatin 40 mg  a day.  Hypertension associated with diabetes ([place]) History of essential pretension blood pressure measured today 138/66.  He is on amlodipine, hydrochlorothiazide, lisinopril and metoprolol.  COPD exacerbation ([place]) History of COPD with ongoing tobacco abuse  Systolic ejection murmur 2D echo that showed aortic sclerosis in the past.  We will recheck 2D echocardiogram  AAA (abdominal aortic aneurysm) without rupture ([facility]) History of abdominal ache aneurysm with ultrasound today measuring 5 cm not previously measured.  This will need to be followed closely with anticipation of referral to vascular surgery for consideration of revascularization  Critical limb ischemia with history of revascularization of same extremity Wounds on right foot for last 5 months currently being treated by Dr. Hardie Pulley at 5 foot.  His Doppler shows severe aortoiliac and SFA disease..  We will proceed with angiography potential endovascular therapy.      Omar Harp MD  FACP,FACC,FAHA, [facility] 02/28/2020 3:25 PM

## 2020-02-28 NOTE — Assessment & Plan Note (Signed)
2D echo that showed aortic sclerosis in the past.  We will recheck 2D echocardiogram

## 2020-02-28 NOTE — Assessment & Plan Note (Signed)
History of moderate bilateral ICA stenosis by duplex ultrasound 08/27/2016.  We will repeat carotid Doppler studies.

## 2020-02-29 LAB — CBC
Hematocrit: 25.1 % — ABNORMAL LOW (ref 37.5–51.0)
Hemoglobin: 7.3 g/dL — ABNORMAL LOW (ref 13.0–17.7)
MCH: 20.5 pg — ABNORMAL LOW (ref 26.6–33.0)
MCHC: 29.1 g/dL — ABNORMAL LOW (ref 31.5–35.7)
MCV: 71 fL — ABNORMAL LOW (ref 79–97)
Platelets: 374 10*3/uL (ref 150–450)
RBC: 3.56 x10E6/uL — ABNORMAL LOW (ref 4.14–5.80)
RDW: 17 % — ABNORMAL HIGH (ref 11.6–15.4)
WBC: 8.9 10*3/uL (ref 3.4–10.8)

## 2020-02-29 LAB — BASIC METABOLIC PANEL
BUN/Creatinine Ratio: 15 (ref 10–24)
BUN: 13 mg/dL (ref 8–27)
CO2: 22 mmol/L (ref 20–29)
Calcium: 9.4 mg/dL (ref 8.6–10.2)
Chloride: 92 mmol/L — ABNORMAL LOW (ref 96–106)
Creatinine, Ser: 0.86 mg/dL (ref 0.76–1.27)
GFR calc Af Amer: 102 mL/min/{1.73_m2} (ref >59)
GFR calc non Af Amer: 88 mL/min/{1.73_m2} (ref >59)
Glucose: 161 mg/dL — ABNORMAL HIGH (ref 65–99)
Potassium: 4.8 mmol/L (ref 3.5–5.2)
Sodium: 130 mmol/L — ABNORMAL LOW (ref 134–144)

## 2020-03-03 ENCOUNTER — Encounter: Payer: Self-pay | Admitting: Family Medicine

## 2020-03-03 ENCOUNTER — Telehealth: Payer: Self-pay | Admitting: Cardiovascular Disease

## 2020-03-03 NOTE — Telephone Encounter (Signed)
Patient's sister calling stating the patient received a call cancelling his appointment 03/16/2020. She would like a call back explaining why.

## 2020-03-03 NOTE — Telephone Encounter (Signed)
Spoke with pt's sister who was inquiring as to why pt's procedure was cancelled. Informed sister that per chart review, MD cancelled procedure due to Hgb of 7.3 and recommended to follow up with PCP. Sister verbalized understanding and state she will call PCP today to schedule today.

## 2020-03-04 ENCOUNTER — Other Ambulatory Visit: Payer: Self-pay | Admitting: *Deleted

## 2020-03-04 ENCOUNTER — Encounter: Payer: Self-pay | Admitting: Internal Medicine

## 2020-03-04 DIAGNOSIS — D649 Anemia, unspecified: Secondary | ICD-10-CM

## 2020-03-05 ENCOUNTER — Other Ambulatory Visit (HOSPITAL_COMMUNITY): Payer: Medicare Other

## 2020-03-06 ENCOUNTER — Other Ambulatory Visit: Payer: Self-pay | Admitting: Cardiovascular Disease

## 2020-03-06 ENCOUNTER — Other Ambulatory Visit: Payer: Self-pay

## 2020-03-06 ENCOUNTER — Ambulatory Visit (INDEPENDENT_AMBULATORY_CARE_PROVIDER_SITE_OTHER): Payer: Medicare Other | Admitting: Podiatry

## 2020-03-06 ENCOUNTER — Ambulatory Visit (HOSPITAL_COMMUNITY)
Admission: RE | Admit: 2020-03-06 | Discharge: 2020-03-06 | Disposition: A | Discharge status: Home / Self Care | Payer: Medicare Other | Source: Ambulatory Visit | Attending: Cardiovascular Disease | Admitting: Cardiovascular Disease

## 2020-03-06 DIAGNOSIS — L97512 Non-pressure chronic ulcer of other part of right foot with fat layer exposed: Secondary | ICD-10-CM | POA: Diagnosis not present

## 2020-03-06 DIAGNOSIS — L97411 Non-pressure chronic ulcer of right heel and midfoot limited to breakdown of skin: Secondary | ICD-10-CM | POA: Diagnosis not present

## 2020-03-06 DIAGNOSIS — I739 Peripheral vascular disease, unspecified: Secondary | ICD-10-CM | POA: Diagnosis not present

## 2020-03-06 DIAGNOSIS — L97412 Non-pressure chronic ulcer of right heel and midfoot with fat layer exposed: Secondary | ICD-10-CM | POA: Diagnosis not present

## 2020-03-06 DIAGNOSIS — I6523 Occlusion and stenosis of bilateral carotid arteries: Secondary | ICD-10-CM

## 2020-03-09 ENCOUNTER — Encounter: Payer: Self-pay | Admitting: Gastroenterology

## 2020-03-09 ENCOUNTER — Encounter: Payer: Self-pay | Admitting: Podiatry

## 2020-03-10 ENCOUNTER — Other Ambulatory Visit: Payer: Self-pay

## 2020-03-10 ENCOUNTER — Other Ambulatory Visit (INDEPENDENT_AMBULATORY_CARE_PROVIDER_SITE_OTHER): Payer: Medicare Other

## 2020-03-10 DIAGNOSIS — I6523 Occlusion and stenosis of bilateral carotid arteries: Secondary | ICD-10-CM

## 2020-03-10 DIAGNOSIS — I10 Essential (primary) hypertension: Secondary | ICD-10-CM

## 2020-03-10 NOTE — Progress Notes (Signed)
car

## 2020-03-10 NOTE — Telephone Encounter (Signed)
[facility] - Dr. Neil Crouch office receptionist states pt is scheduled for 03/12/2020 at 3:00pm arrive 2:45pm.

## 2020-03-12 ENCOUNTER — Encounter: Payer: Self-pay | Admitting: *Deleted

## 2020-03-12 ENCOUNTER — Inpatient Hospital Stay (HOSPITAL_COMMUNITY)
Admission: EM | Admit: 2020-03-12 | Discharge: 2020-03-14 | DRG: 378 | Discharge status: Against Medical Advice | Payer: Medicare Other | Source: Ambulatory Visit | Attending: Family Medicine | Admitting: Family Medicine

## 2020-03-12 ENCOUNTER — Emergency Department (HOSPITAL_COMMUNITY): Payer: Medicare Other

## 2020-03-12 ENCOUNTER — Other Ambulatory Visit (HOSPITAL_COMMUNITY)
Admission: RE | Admit: 2020-03-12 | Discharge: 2020-03-12 | Disposition: A | Discharge status: Home / Self Care | Payer: Medicare Other | Source: Ambulatory Visit | Attending: *Deleted | Admitting: *Deleted

## 2020-03-12 ENCOUNTER — Telehealth: Payer: Self-pay | Admitting: *Deleted

## 2020-03-12 ENCOUNTER — Ambulatory Visit (INDEPENDENT_AMBULATORY_CARE_PROVIDER_SITE_OTHER): Payer: Medicare Other | Admitting: Nurse Practitioner

## 2020-03-12 ENCOUNTER — Other Ambulatory Visit (HOSPITAL_COMMUNITY): Payer: Medicare Other

## 2020-03-12 ENCOUNTER — Encounter: Payer: Self-pay | Admitting: Nurse Practitioner

## 2020-03-12 ENCOUNTER — Other Ambulatory Visit: Payer: Self-pay

## 2020-03-12 ENCOUNTER — Encounter (HOSPITAL_COMMUNITY): Payer: Self-pay

## 2020-03-12 ENCOUNTER — Telehealth: Payer: Self-pay | Admitting: Internal Medicine

## 2020-03-12 DIAGNOSIS — Z20822 Contact with and (suspected) exposure to covid-19: Secondary | ICD-10-CM | POA: Diagnosis present

## 2020-03-12 DIAGNOSIS — E1165 Type 2 diabetes mellitus with hyperglycemia: Secondary | ICD-10-CM | POA: Diagnosis not present

## 2020-03-12 DIAGNOSIS — R0902 Hypoxemia: Secondary | ICD-10-CM | POA: Diagnosis present

## 2020-03-12 DIAGNOSIS — I700 Atherosclerosis of aorta: Secondary | ICD-10-CM | POA: Diagnosis not present

## 2020-03-12 DIAGNOSIS — Z7982 Long term (current) use of aspirin: Secondary | ICD-10-CM | POA: Diagnosis not present

## 2020-03-12 DIAGNOSIS — Z823 Family history of stroke: Secondary | ICD-10-CM

## 2020-03-12 DIAGNOSIS — I779 Disorder of arteries and arterioles, unspecified: Secondary | ICD-10-CM | POA: Diagnosis present

## 2020-03-12 DIAGNOSIS — K921 Melena: Secondary | ICD-10-CM | POA: Diagnosis not present

## 2020-03-12 DIAGNOSIS — J449 Chronic obstructive pulmonary disease, unspecified: Secondary | ICD-10-CM | POA: Diagnosis present

## 2020-03-12 DIAGNOSIS — E1159 Type 2 diabetes mellitus with other circulatory complications: Secondary | ICD-10-CM | POA: Diagnosis present

## 2020-03-12 DIAGNOSIS — E1140 Type 2 diabetes mellitus with diabetic neuropathy, unspecified: Secondary | ICD-10-CM | POA: Diagnosis present

## 2020-03-12 DIAGNOSIS — I350 Nonrheumatic aortic (valve) stenosis: Secondary | ICD-10-CM | POA: Diagnosis not present

## 2020-03-12 DIAGNOSIS — I6523 Occlusion and stenosis of bilateral carotid arteries: Secondary | ICD-10-CM | POA: Diagnosis not present

## 2020-03-12 DIAGNOSIS — D649 Anemia, unspecified: Secondary | ICD-10-CM | POA: Diagnosis present

## 2020-03-12 DIAGNOSIS — E1151 Type 2 diabetes mellitus with diabetic peripheral angiopathy without gangrene: Secondary | ICD-10-CM | POA: Diagnosis present

## 2020-03-12 DIAGNOSIS — I352 Nonrheumatic aortic (valve) stenosis with insufficiency: Secondary | ICD-10-CM | POA: Diagnosis present

## 2020-03-12 DIAGNOSIS — Z833 Family history of diabetes mellitus: Secondary | ICD-10-CM | POA: Diagnosis not present

## 2020-03-12 DIAGNOSIS — D500 Iron deficiency anemia secondary to blood loss (chronic): Secondary | ICD-10-CM | POA: Diagnosis not present

## 2020-03-12 DIAGNOSIS — E1169 Type 2 diabetes mellitus with other specified complication: Secondary | ICD-10-CM | POA: Diagnosis present

## 2020-03-12 DIAGNOSIS — I70203 Unspecified atherosclerosis of native arteries of extremities, bilateral legs: Secondary | ICD-10-CM | POA: Diagnosis not present

## 2020-03-12 DIAGNOSIS — I351 Nonrheumatic aortic (valve) insufficiency: Secondary | ICD-10-CM | POA: Diagnosis not present

## 2020-03-12 DIAGNOSIS — F17210 Nicotine dependence, cigarettes, uncomplicated: Secondary | ICD-10-CM | POA: Diagnosis present

## 2020-03-12 DIAGNOSIS — I152 Hypertension secondary to endocrine disorders: Secondary | ICD-10-CM | POA: Diagnosis present

## 2020-03-12 DIAGNOSIS — Z5329 Procedure and treatment not carried out because of patient's decision for other reasons: Secondary | ICD-10-CM | POA: Diagnosis present

## 2020-03-12 DIAGNOSIS — E785 Hyperlipidemia, unspecified: Secondary | ICD-10-CM | POA: Diagnosis not present

## 2020-03-12 DIAGNOSIS — I10 Essential (primary) hypertension: Secondary | ICD-10-CM

## 2020-03-12 DIAGNOSIS — Z79899 Other long term (current) drug therapy: Secondary | ICD-10-CM | POA: Diagnosis not present

## 2020-03-12 DIAGNOSIS — E1152 Type 2 diabetes mellitus with diabetic peripheral angiopathy with gangrene: Secondary | ICD-10-CM | POA: Diagnosis not present

## 2020-03-12 DIAGNOSIS — I714 Abdominal aortic aneurysm, without rupture, unspecified: Secondary | ICD-10-CM | POA: Diagnosis present

## 2020-03-12 DIAGNOSIS — IMO0002 Reserved for concepts with insufficient information to code with codable children: Secondary | ICD-10-CM

## 2020-03-12 DIAGNOSIS — E1065 Type 1 diabetes mellitus with hyperglycemia: Secondary | ICD-10-CM

## 2020-03-12 DIAGNOSIS — N400 Enlarged prostate without lower urinary tract symptoms: Secondary | ICD-10-CM | POA: Diagnosis present

## 2020-03-12 DIAGNOSIS — L03115 Cellulitis of right lower limb: Secondary | ICD-10-CM | POA: Diagnosis present

## 2020-03-12 DIAGNOSIS — E119 Type 2 diabetes mellitus without complications: Secondary | ICD-10-CM

## 2020-03-12 DIAGNOSIS — Z809 Family history of malignant neoplasm, unspecified: Secondary | ICD-10-CM

## 2020-03-12 DIAGNOSIS — F329 Major depressive disorder, single episode, unspecified: Secondary | ICD-10-CM | POA: Diagnosis present

## 2020-03-12 DIAGNOSIS — K922 Gastrointestinal hemorrhage, unspecified: Secondary | ICD-10-CM

## 2020-03-12 DIAGNOSIS — I70239 Atherosclerosis of native arteries of right leg with ulceration of unspecified site: Secondary | ICD-10-CM

## 2020-03-12 DIAGNOSIS — Z7984 Long term (current) use of oral hypoglycemic drugs: Secondary | ICD-10-CM

## 2020-03-12 HISTORY — DX: Gangrene, not elsewhere classified: I96

## 2020-03-12 LAB — BASIC METABOLIC PANEL
Anion gap: 9 (ref 5–15)
BUN: 20 mg/dL (ref 8–23)
CO2: 23 mmol/L (ref 22–32)
Calcium: 8.7 mg/dL — ABNORMAL LOW (ref 8.9–10.3)
Chloride: 98 mmol/L (ref 98–111)
Creatinine, Ser: 1.06 mg/dL (ref 0.61–1.24)
GFR calc Af Amer: >60 mL/min (ref >60)
GFR calc non Af Amer: >60 mL/min (ref >60)
Glucose, Bld: 282 mg/dL — ABNORMAL HIGH (ref 70–99)
Potassium: 4.5 mmol/L (ref 3.5–5.1)
Sodium: 130 mmol/L — ABNORMAL LOW (ref 135–145)

## 2020-03-12 LAB — RESPIRATORY PANEL BY RT PCR (FLU A&B, COVID)
Influenza A by PCR: NEGATIVE
Influenza B by PCR: NEGATIVE
SARS Coronavirus 2 by RT PCR: NEGATIVE

## 2020-03-12 LAB — CBC WITH DIFFERENTIAL/PLATELET
Abs Immature Granulocytes: 0.05 10*3/uL (ref 0.00–0.07)
Basophils Absolute: 0 10*3/uL (ref 0.0–0.1)
Basophils Relative: 0 %
Eosinophils Absolute: 0.2 10*3/uL (ref 0.0–0.5)
Eosinophils Relative: 2 %
HCT: 23.1 % — ABNORMAL LOW (ref 39.0–52.0)
Hemoglobin: 6.5 g/dL — CL (ref 13.0–17.0)
Immature Granulocytes: 1 %
Lymphocytes Relative: 14 %
Lymphs Abs: 1.3 10*3/uL (ref 0.7–4.0)
MCH: 20.6 pg — ABNORMAL LOW (ref 26.0–34.0)
MCHC: 28.1 g/dL — ABNORMAL LOW (ref 30.0–36.0)
MCV: 73.3 fL — ABNORMAL LOW (ref 80.0–100.0)
Monocytes Absolute: 0.7 10*3/uL (ref 0.1–1.0)
Monocytes Relative: 7 %
Neutro Abs: 7.2 10*3/uL (ref 1.7–7.7)
Neutrophils Relative %: 76 %
Platelets: 413 10*3/uL — ABNORMAL HIGH (ref 150–400)
RBC: 3.15 MIL/uL — ABNORMAL LOW (ref 4.22–5.81)
RDW: 18.5 % — ABNORMAL HIGH (ref 11.5–15.5)
WBC: 9.4 10*3/uL (ref 4.0–10.5)
nRBC: 0 % (ref 0.0–0.2)

## 2020-03-12 LAB — ABO/RH: ABO/RH(D): A POS

## 2020-03-12 LAB — IRON AND TIBC
Iron: 14 ug/dL — ABNORMAL LOW (ref 45–182)
Saturation Ratios: 3 % — ABNORMAL LOW (ref 17.9–39.5)
TIBC: 438 ug/dL (ref 250–450)
UIBC: 424 ug/dL

## 2020-03-12 LAB — FERRITIN: Ferritin: 9 ng/mL — ABNORMAL LOW (ref 24–336)

## 2020-03-12 LAB — PREPARE RBC (CROSSMATCH)

## 2020-03-12 LAB — POC OCCULT BLOOD, ED: Fecal Occult Bld: POSITIVE — AB

## 2020-03-12 IMAGING — CT CT ANGIO AOBIFEM WO/W CM
1 of 15 series · 11 of 48 positions shown, 15 images · IV contrast (Omnipaque or Isovue)
Comparison: None.

CLINICAL DATA: 68-year-old male with claudication.

EXAM:
CT ANGIOGRAPHY OF ABDOMINAL AORTA WITH ILIOFEMORAL RUNOFF
TECHNIQUE: Multidetector CT imaging of the abdomen, pelvis and lower
extremities was performed using the standard protocol during bolus
administration of intravenous contrast. Multiplanar CT image
reconstructions and MIPs were obtained to evaluate the vascular
anatomy.
CONTRAST:  150mL OMNIPAQUE IOHEXOL 350 MG/ML SOLN

[Series 10: arterial · axial · arterial · 0.86mm/px · z∈[-1081,-16]mm · 11 of 403 slices shown, 15 images]
[im 24/403  soft-tissue]
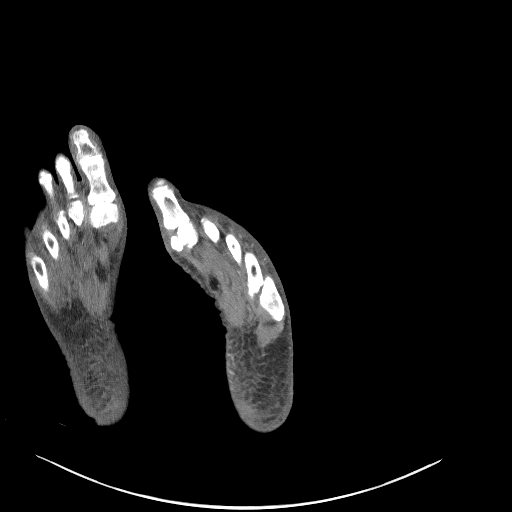
[im 24/403  bone]
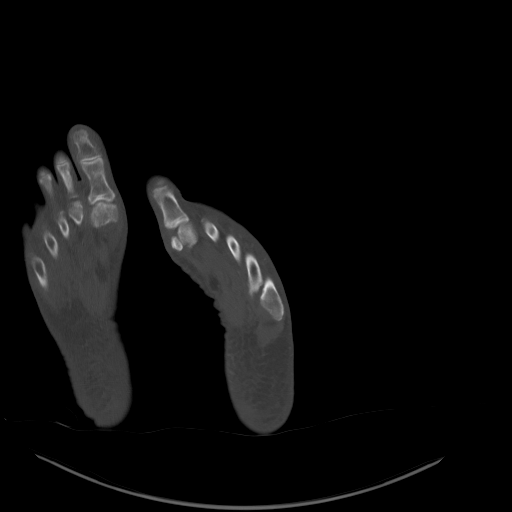
[im 71/403  soft-tissue]
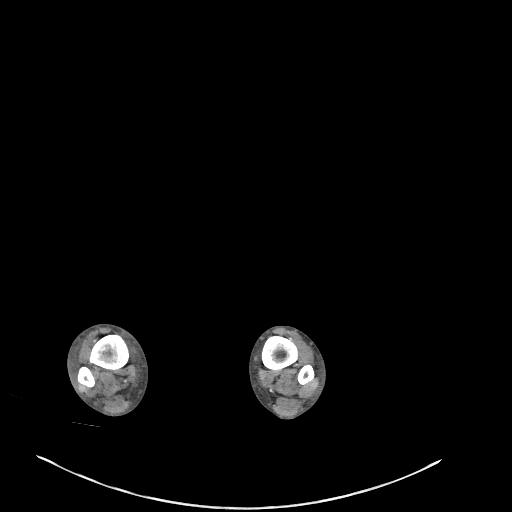
[im 119/403  soft-tissue]
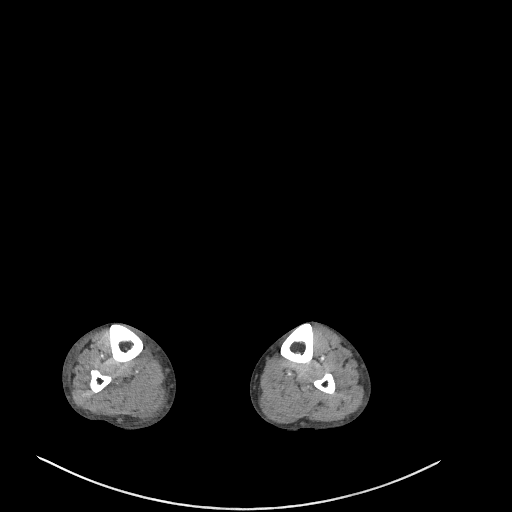
[im 166/403  soft-tissue]
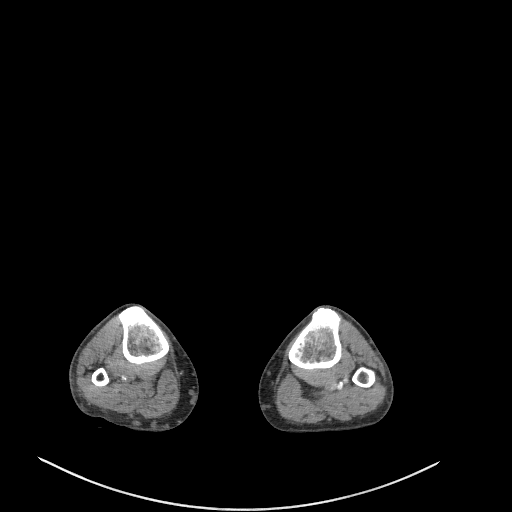
[im 213/403  soft-tissue]
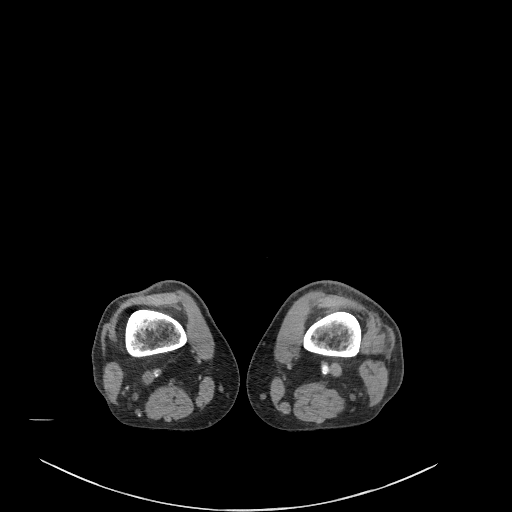
[im 237/403  soft-tissue]
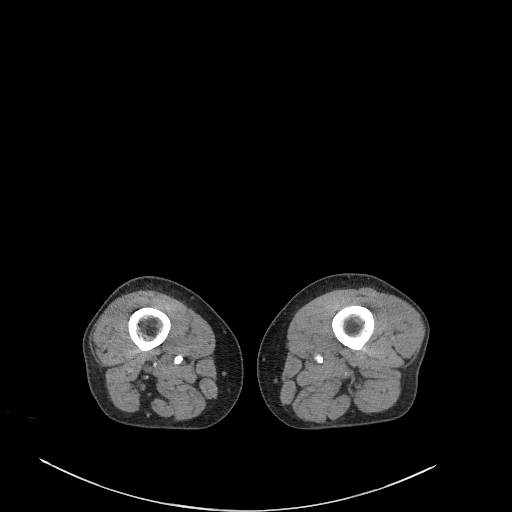
[im 284/403  soft-tissue]
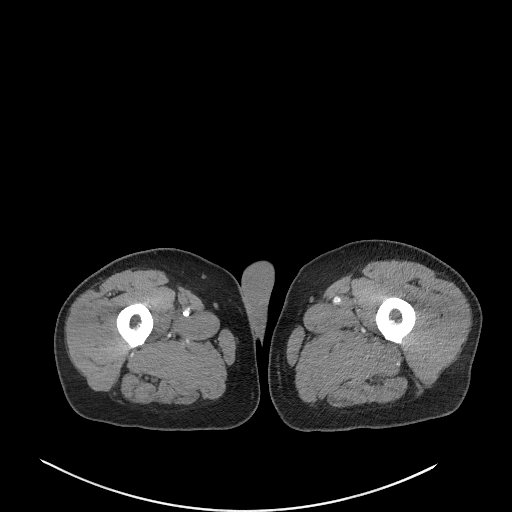
[im 308/403  lung]
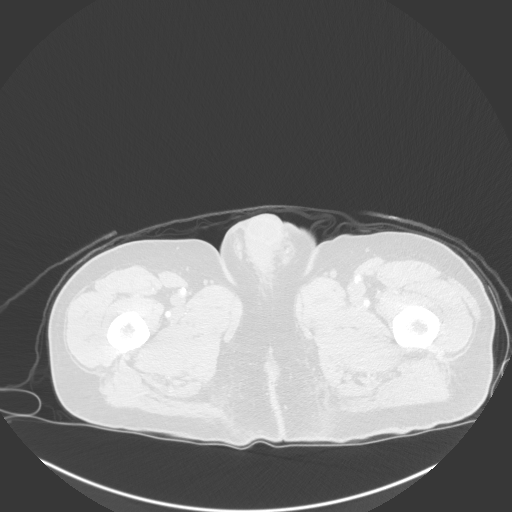
[im 332/403  soft-tissue]
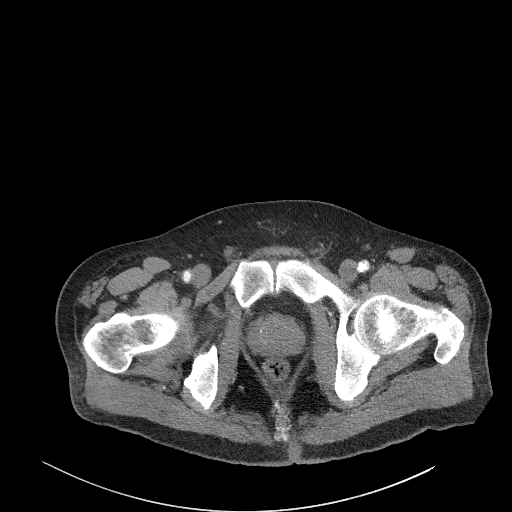
[im 332/403  lung]
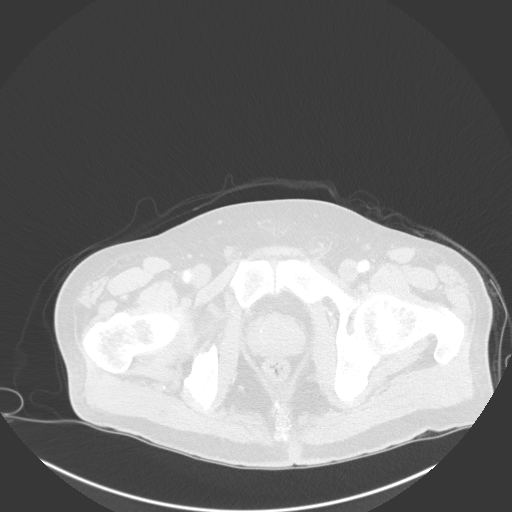
[im 355/403  lung]
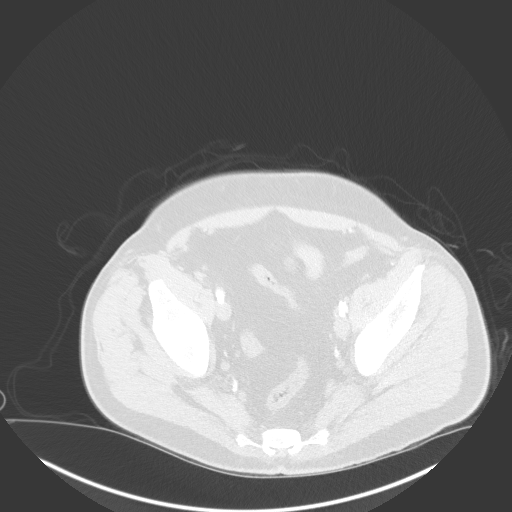
[im 379/403  soft-tissue]
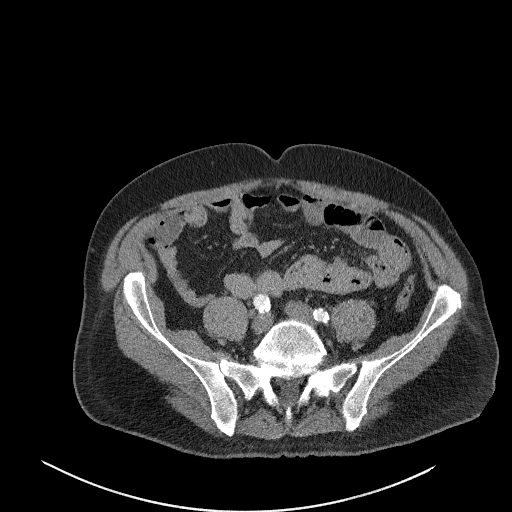
[im 379/403  lung]
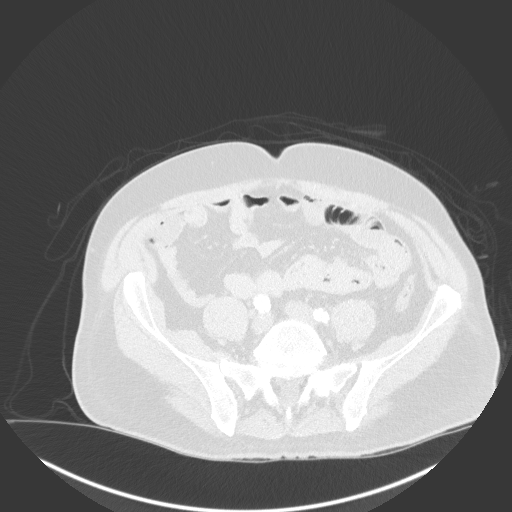
[im 379/403  bone]
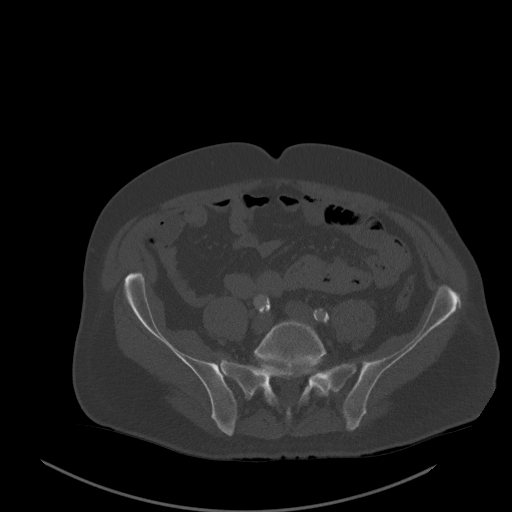

[11 of 48 positions shown; findings below may reference images not displayed]

FINDINGS: Evaluation of this exam is limited due to respiratory motion
artifact.

VASCULAR

Aorta: There is advanced atherosclerotic calcification of the
abdominal aorta. There is a 4.7 cm fusiform infrarenal abdominal
aortic aneurysm measuring approximately 10 cm in craniocaudal length
and extends to the level of aortic bifurcation. There is thrombosis
of the majority of the lumen of the aneurysm in the periphery.
Evaluation of the aneurysm however is limited as precontrast and
delayed images are not performed. No periaortic inflammation, fluid
collection, or hematoma.

Celiac: There is atherosclerotic calcification of the origin of the
celiac axis. The celiac artery and its major branches appear patent.

SMA: There is atherosclerotic calcification of the origin of the
SMA. The SMA remains patent.

Renals: Evaluation of the renal arteries is somewhat limited due to
suboptimal opacification and respiratory motion. There is
atherosclerotic calcification of the origins of the renal arteries.
The renal arteries remain patent.

IMA: The IMA is not well visualized.

RIGHT Lower Extremity

Inflow: There is narrowing of the proximal right common iliac artery
due to advanced atherosclerotic disease. There is advanced
atherosclerotic calcification of the iliac arteries. Right external
iliac artery remains patent. There is diminished flow in the right
internal iliac artery.

Outflow: There is advanced atherosclerotic calcification of the
right common femoral artery, and superficial femoral artery. There
is associated luminal narrowing with decreased flow. Severe
atherosclerotic calcification of the distal aspect of the
superficial femoral artery with decreased flow. The right popliteal
artery is patent but with decreased flow.

Runoff: There is atherosclerotic calcification of the proximal
arteries of the calf with diminished flow. There is flow within the
anterior and posterior tibial arteries to the level of the ankle
although with diminished flow. There is flow within the fibular
artery. Flow is noted within the visualized proximal portion of the
right dorsalis pedis and plantar arteries, although diminished.

LEFT Lower Extremity

Inflow: There is severe atherosclerotic calcification of the
proximal portion of the left common iliac artery. There is advanced
atherosclerotic calcification of the iliac arteries on the left. The
left external iliac artery remains patent. There is diminished flow
in the left internal iliac artery.

Outflow: There is advanced atherosclerotic disease in the left
common femoral, and superficial femoral artery. There is associated
luminal narrowing with diminished flow within these vessels. The
deep femoral artery is patent. The popliteal artery is patent
although with diminished flow due to advanced atherosclerotic
disease and luminal narrowing.

Runoff: There is atherosclerotic calcification of the trifurcation
of the popliteal artery and proximal portion of the calf vessels.
The anterior and posterior tibial arteries as well as the fibular
artery are patent to the level of the ankle. The dorsalis pedis and
plantar arteries are patent as visualized.

Veins: No obvious venous abnormality within the limitations of this
arterial phase study.

Review of the MIP images confirms the above findings.

NON-VASCULAR

Lower chest: There are bibasilar dependent atelectasis. There is
mild cardiomegaly. Coronary vascular calcifications noted.

No intra-abdominal free air or free fluid.

Hepatobiliary: The liver is unremarkable. No intrahepatic biliary
ductal dilatation. The gallbladder is unremarkable.

Pancreas: Unremarkable. No pancreatic ductal dilatation or
surrounding inflammatory changes.

Spleen: Normal in size without focal abnormality.

Adrenals/Urinary Tract: The adrenal glands are unremarkable. There
is no hydronephrosis on either side. Small bilateral renal hypodense
lesions are too small to characterize. The visualized ureters and
urinary bladder appear unremarkable.

Stomach/Bowel: There is no bowel obstruction or active inflammation.
The appendix is normal.

Lymphatic: No adenopathy.

Reproductive: Enlarged prostate gland measuring 5.2 cm in transverse
axial diameter.

Other: None

Musculoskeletal: Degenerative changes of the spine. No acute osseous
pathology. There is diffuse subcutaneous edema of the distal lower
extremity and feet, right greater than left.
IMPRESSION: 1. A 4.7 cm partially thrombosed fusiform infrarenal abdominal
aortic aneurysm. Evaluation of the aneurysm is limited as
precontrast and delayed images are not performed. No periaortic
inflammation or fluid. Clinical correlation and vascular surgery
consult is advised.
2. Advanced atherosclerotic calcification of the aorta, iliac
arteries, and arteries of the bilateral lower extremities. There is
associated luminal narrowing of the vessels of the lower extremities
with diminished flow. There is greater involvement of the right
lower extremity vessels with more significant diminished inflow in
the right lower extremity compared to the left. Flow noted to the
level of the ankles bilaterally although significantly diminished on
the right.
3. Diffuse subcutaneous edema of the distal calf and feet, right
greater than left.
4. No bowel obstruction. Normal appendix.
5. Enlarged prostate gland.

## 2020-03-12 MED ORDER — CLENPIQ 10-3.5-12 MG-GM -GM/160ML PO SOLN: 1.0000 | Freq: Once | ORAL | 0 refills | Status: AC

## 2020-03-12 MED ORDER — SODIUM CHLORIDE 0.9 % IV SOLN: 10.0000 mL/h | Freq: Once | INTRAVENOUS | Status: DC

## 2020-03-12 MED ORDER — IOHEXOL 350 MG/ML SOLN
150.0000 mL | Freq: Once | INTRAVENOUS | Status: AC | PRN
  Administered 2020-03-12: 150 mL via INTRAVENOUS

## 2020-03-12 NOTE — Assessment & Plan Note (Addendum)
The patient was referred to [place] for anemia with a hemoglobin of 7.3 on 02/28/2020.  Noted progressive decline in hemoglobin over the past 6 to 8 months.  The patient has chronic foot wound and is scheduled for aortogram and bilateral lower extremities angiogram with possible stenting.  He has a chronic right foot wound that is struggling to heal.  Denies significant bleeding in the past few months from this.  Denies overt bleeding including no hematochezia or melena.  He states he has had a colonoscopy less than 5 years ago at [place] in the hospital, but I cannot find any report of this.  Regardless, he has had a significant decline in his hemoglobin.  His last labs were checked about 2 weeks ago.  I will recheck a stat CBC to ensure he has not had further decline.  I will also check an iron panel including ferritin.  If his hemoglobin has declined much further we will need to admit him to the hospital for transfusion and evaluation.  Multiple differentials for etiology of bleeding including benign anorectal source, bleeding polyp, less likely colorectal cancer.  Possible esophagitis, gastritis, duodenitis, peptic ulcer disease, gastric erosions, duodenal erosions, less likely gastric cancer or esophageal cancer.  Overall I feel the most likely etiology is gastric and/or duodenal erosions and likely ulcers.  The patient is on the following NSAID type medications: Diclofenac twice daily, meloxicam twice daily, over-the-counter baby aspirin daily, over-the-counter ibuprofen twice daily, aspirin powders about once a week.  Likely NSAID induced injury.  I will have him reduce his NSAIDs as much as feasibly possible given his chronic foot wound and chronic pain.  We will plan for colonoscopy and endoscopy to evaluate ASAP.  Proceed with TCS and EGD with Dr. Oneida Alar on propofol/MAC in near future: the risks, benefits, and alternatives have been discussed with the patient in detail. The patient states understanding  and desires to proceed.  The patient is currently on Jardiance, Lexapro, Glucophage. The patient is not on any other anticoagulants, anxiolytics, chronic pain medications, antidepressants, antidiabetics, or iron supplements.  We will plan for the procedure on propofol/MAC.

## 2020-03-12 NOTE — Telephone Encounter (Signed)
PA approved via [facility] website. Auth# [id number] DATES 03/17/2020-06/15/2020

## 2020-03-12 NOTE — Patient Instructions (Signed)
Your health issues we discussed today were:   Anemia (blood loss): 1. Stop taking Prilosec 2. I have sent a prescription to your pharmacy for Protonix 40 mg.  Take this twice a day, pursing in the morning and 30 minutes for your last meal the day 3. Have your labs drawn when you leave our office today.  We will call you with results 4. Stop taking Goody powder/BC powder and over-the-counter ibuprofen.  Call your physician who prescribes Voltaren and Mobic and see if they can stop 1 of these medications 5. Call us if you see any obvious bleeding or black stools 6. We will schedule your colonoscopy and upper endoscopy for you.   7. Further recommendations will follow your procedures  Overall I recommend:  1. Continue your other current medications 2. Return for follow-up in 4 weeks 3. Call us if you have any questions or concerns.   ---------------------------------------------------------------  COVID-19 Vaccine Information can be found at: [email or web address].uk For questions related to vaccine distribution or appointments, please email [email or web address] or call [phone].   ---------------------------------------------------------------   At [facility] we value your feedback. You may receive a survey about your visit today. Please share your experience as we strive to create trusting relationships with our patients to provide genuine, compassionate, quality care.  We appreciate your understanding and patience as we review any laboratory studies, imaging, and other diagnostic tests that are ordered as we care for you. Our office policy is 5 business days for review of these results, and any emergent or urgent results are addressed in a timely manner for your best interest. If you do not hear from our office in 1 week, please contact us.   We also encourage the use of MyChart, which contains your medical  information for your review as well. If you are not enrolled in this feature, an access code is on this after visit summary for your convenience. Thank you for allowing [place] to be involved in your care.  It was great to see you today!  I hope you have a great Summer!!

## 2020-03-12 NOTE — Telephone Encounter (Signed)
Lab called, hgb 6.5 today;  Steady decline in the medical record.  I called pt - call went to voicemail. Left message.  We will make contact in am with instructions.  If any progressive sx of anemia, I advised him to go to the ED this evening.He will likely need a transfusion.

## 2020-03-12 NOTE — H&P (Addendum)
History and Physical    Patient Demographics:    Omar Todd JHE:[id number] DOB: 1951/08/05 DOA: 03/12/2020  PCP: [name], DO  Patient coming from: Home  I have personally briefly reviewed patient's old medical records in [place]  Chief Complaint: Worsening anemia   Assessment & Plan:     Assessment/Plan Principal Problem:   GI bleed Active Problems:   Diabetic neuropathy, type II diabetes mellitus ([place])   Bilateral carotid artery disease ([place])   Hyperlipidemia associated with type 2 diabetes mellitus ([place])   Hypertension associated with diabetes ([place])   Diabetes mellitus type 2 controlled   BPH (benign prostatic hyperplasia)   AAA (abdominal aortic aneurysm) without rupture ([place])   Anemia     Principal Problem: Acute Upper GI bleed Patient has been taking ibuprofen, meloxicam, baby aspirin at home.  Patient presented with worsening anemia.  Noted to have fecal occult blood positive in the ER. Likely gastritis/esophagitis/peptic ulcer disease in the setting of chronic NSAID use  -will place on PPI drip -keep NPO -GI consult in AM -monitor CBC  Other Active Problems: Acute on Chronic Microcytic Anemia Hb 6.5 on presentation with low MCV, ferritin is noted to be 9 suggesting chronic iron deficiency -will transfuse 2 units and monitor CBC -Ferrous sulfate will be continued  Peripheral Vascular Disease CT angio with runoff showed luminal narrowing of the vessels of the lower extremities with diminished flow greater on the right than the left.  Flow is noted to the level of the ankles bilaterally. Case was discussed with vascular surgery on-call, Dr. Carlis Abbott who reviewed the images and did not believe patient needed any kind of urgent surgical intervention and did not recommend transfer to [facility].  Will need to follow-up as outpatient.  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  No significant purulent drainage noted.  Some redness is noted on the wounds. -We will empirically placed IV antibiotics for now -Will need vascular surgery consult as outpatient  Diabetes Mellitus type 2 -Hold oral hypoglycemics -sliding scale insulin coverage with fingerstick monitoring   COPD  Not in acute exacerbation -albuterol prn  Hypertension -continue Metoprolol, amlodipine   Hyperlipidemia -continue pravastatin   Depression -continue Lexapro  Abdominal aortic aneurysm CT angiogram shows a 4.7 cm partially thrombosed abdominal aortic aneurysm. No acute intervention currently. -Follow-up as outpatient  Mild hypoxemia Patient noted to desaturate to the 80s on room air when he is falling asleep.  Returns back to 92 to 94% when he is awake.  No known history of obstructive sleep apnea.  Will place on oxygen for now.  Will need to monitor closely. Has a murmur on Cardiac exam ECHO has been ordered   DVT prophylaxis: Lovenox Code Status:  Full code Family Communication: N/A  Disposition Plan: admitted as inpatient for GI bleed workup  Consults called: N/A Admission status: inpatient status    HPI:     HPI: Omar Todd is a 69 y.o. male with medical history significant of COPD, hypertension, hyperlipidemia, DM type 2, COPD, anemia who presented to the ER after being sent in by gastroenterology due to worsening anemia.  Patient was noted to have a drop in hemoglobin with current level being 6.5.  He was sent in for further evaluation for blood transfusion and potential GI work-up.  He reports  having a wound and gangrene over the right toe over the past 5 months which has gotten worse and he has been taking ibuprofen, meloxicam, diclofenac, baby aspirin. He denies any hematemesis, melena, hematochezia, abdominal pain, chest pain, nausea, vomiting, diarrhea,  dizziness, lightheadedness, syncope, seizures.  He does endorse increased weakness over the last several weeks. ED Course:  Vital Signs reviewed on presentation, significant for temperature 98.3, heart rate 84, blood pressure 130/67, saturation 97% on room air. Labs reviewed, significant for sodium 130, potassium 4.5, BUN 20, creatinine 1.06, ferritin 9, iron 14, WBC count 9.4, hemoglobin 6.5, hematocrit 23, MCV 73, platelets 413, Fecal occult blood is positive, SARS Covid RT-PCR negative, flu PCR negative. Imaging personally Reviewed, CT angiogram of the abdomen and bifemoral runoff shows a 4.7 cm partially thrombosed fusiform infrarenal abdominal aortic aneurysm.  There is advanced atherosclerotic calcification of the aorta, iliac arteries and bilateral lower extremity arteries.  Associated luminal narrowing of the vessels of the lower extremities with diminished flow, greater involvement on the right than the left.  Flow is noted to the level of the ankles bilaterally although significantly diminished on the right.  Diffuse subcutaneous edema of the distal calf and feet right better than left.  No bowel obstruction.    Review of systems:    Review of Systems: As per HPI otherwise 10 point review of systems negative.  All other review of systems is negative except the ones noted above in the HPI.    Past Medical and Surgical History:  Reviewed by me  Past Medical History:  Diagnosis Date  . COPD (chronic obstructive pulmonary disease) ([place])   . Diabetes mellitus without complication ([place])   . Gangrene of toe of right foot ([place])   . GERD (gastroesophageal reflux disease)   . Hyperlipidemia   . Hypertension     Past Surgical History:  Procedure Laterality Date  . Thumb surgery       Social History:  Reviewed by me   reports that he has been smoking cigarettes. He has a 30.00 pack-year smoking history. He has never used smokeless tobacco. He reports that he does not drink alcohol or  use drugs.  Allergies:    Allergies  Allergen Reactions  . Penicillins Other (See Comments)    Pt. States he "passed out"    Family History :   Family History  Problem Relation Age of Onset  . Cancer Father   . Diabetes Sister   . Stroke Brother   . Healthy Sister   . Healthy Sister   . Colon cancer Neg Hx   . Gastric cancer Neg Hx    Family history reviewed, noted as above, not pertinent to current presentation.   Home Medications:    Prior to Admission medications   Medication Sig Start Date End Date Taking? Authorizing Provider  amLODipine (NORVASC) 10 MG tablet TAKE ONE (1) TABLET EACH DAY Patient taking differently: Take 10 mg by mouth daily.  08/21/19  Yes Ronnie Doss M, DO  aspirin EC 81 MG tablet Take 81 mg by mouth daily.   Yes [provider]  BIOTIN 5000 PO Take 1 capsule by mouth daily.   Yes [provider]  cholecalciferol (VITAMIN D) 1000 UNITS tablet Take 1,000 Units by mouth daily.   Yes [provider]  collagenase (SANTYL) ointment Apply 1 application topically daily. Ulcers to right 4th sulcus, right 1st toe and right heel measurements sent on [place] form. Patient taking differently: Apply 1  application topically See admin instructions. Ulcers to right 4th sulcus, right 1st toe and right heel measurements sent on [place] form. 02/14/20  Yes Evelina Bucy, DPM  diclofenac (VOLTAREN) 75 MG EC tablet Take 75 mg by mouth 2 (two) times daily. 12/02/19  Yes [provider]  empagliflozin (JARDIANCE) 25 MG TABS tablet Take 25 mg by mouth daily before breakfast. 08/21/19  Yes Gottschalk, Ashly M, DO  escitalopram (LEXAPRO) 10 MG tablet TAKE ONE (1) TABLET EACH DAY Patient taking differently: Take 10 mg by mouth daily.  10/01/19  Yes Gottschalk, Ashly M, DO  hydrochlorothiazide (HYDRODIURIL) 25 MG tablet TAKE ONE (1) TABLET EACH DAY Patient taking differently: Take 25 mg by mouth  daily.  04/05/19  Yes Gottschalk, Ashly M, DO  lisinopril (ZESTRIL) 40 MG tablet TAKE ONE (1) TABLET EACH DAY 08/21/19  Yes Gottschalk, Ashly M, DO  meloxicam (MOBIC) 7.5 MG tablet TAKE ONE TABLET BY MOUTH TWICE DAILY Patient taking differently: Take 7.5 mg by mouth in the morning and at bedtime.  12/13/19  Yes Gottschalk, Ashly M, DO  metFORMIN (GLUCOPHAGE) 1000 MG tablet TAKE ONE TABLET TWICE A DAY WITH FOOD Patient taking differently: Take 1,000 mg by mouth 2 (two) times daily with a meal.  04/05/19  Yes Gottschalk, Ashly M, DO  metoprolol tartrate (LOPRESSOR) 25 MG tablet Take 1 tablet (25 mg total) by mouth 2 (two) times daily. 04/05/19  Yes Gottschalk, Ashly M, DO  montelukast (SINGULAIR) 10 MG tablet TAKE ONE TABLET DAILY AT BEDTIME Patient taking differently: Take 10 mg by mouth at bedtime.  01/07/20  Yes Gottschalk, Ashly M, DO  omeprazole (PRILOSEC) 20 MG capsule TAKE ONE (1) [place] Patient taking differently: Take 20 mg by mouth in the morning.  12/13/19  Yes Gottschalk, Ashly M, DO  pravastatin (PRAVACHOL) 40 MG tablet TAKE ONE (1) TABLET EACH DAY Patient taking differently: Take 40 mg by mouth every evening.  08/21/19  Yes Gottschalk, Ashly M, DO  [name] (JANUVIA) 100 MG tablet Take 1 tablet (100 mg total) by mouth daily. 01/29/20  Yes Gottschalk, [place] 10-3.5-12 MG-GM -GM/160ML SOLN Take 1 kit by mouth once for 1 dose. 03/12/20 03/12/20  Danie Binder, MD  ferrous sulfate 325 (65 FE) MG tablet Take 1 tablet (325 mg total) by mouth daily. Patient not taking: Reported on 03/12/2020 02/05/20   [place] Lanius A, PA-C  mupirocin cream (BACTROBAN) 2 % Apply 1 application topically 2 (two) times daily. Patient not taking: Reported on 03/12/2020 02/05/20   Volanda Napoleon, PA-C    Physical Exam:    Physical Exam: Vitals:   03/12/20 2015 03/12/20 2030 03/12/20 2100 03/12/20 2130  BP:  139/62 140/74 119/66  Pulse: 92 98 95 88  Resp:      Temp:      TempSrc:       SpO2: 94% 95% 97% 91%  Weight:      Height:        Constitutional: NAD, calm, comfortable Vitals:   03/12/20 2015 03/12/20 2030 03/12/20 2100 03/12/20 2130  BP:  139/62 140/74 119/66  Pulse: 92 98 95 88  Resp:      Temp:      TempSrc:      SpO2: 94% 95% 97% 91%  Weight:      Height:       Eyes: PERRL, lids and conjunctivae normal ENMT: Mucous membranes are moist. Posterior pharynx clear of any exudate or lesions.Normal dentition.  Neck: normal, supple, no masses, no thyromegaly Respiratory: clear to auscultation bilaterally, no wheezing, no crackles. Normal respiratory effort. No accessory muscle use.  Cardiovascular: Regular rate and rhythm, noted to have a 4/6 holo systolic murmur, no / rubs / gallops. No extremity edema. 2+ pedal pulses. No carotid bruits.  Abdomen: no tenderness, no masses palpated. No hepatosplenomegaly. Bowel sounds positive.  Musculoskeletal: no clubbing / cyanosis. No joint deformity upper and lower extremities. Good ROM, no contractures. Normal muscle tone.  Skin: Increased redness over the right lower extremity, increased capillary refill, feeble pulses over dorsalis pedis, increased coolness over the right lower extremity, multiple gangrenous wounds noted over the right great toe, third fourth and fifth toe with blackened areas with mild surrounding cellulitis. Neurologic: CN 2-12 grossly intact. Sensation intact, DTR normal. Strength 5/5 in all 4.  Psychiatric: Normal judgment and insight. Alert and oriented x 3. Normal mood.    Decubitus Ulcers: Not present on admission Catheters and tubes: None  Data Review:    Labs on Admission: I have personally reviewed following labs and imaging studies  CBC: Recent Labs  Lab 03/12/20 1708  WBC 9.4  NEUTROABS 7.2  HGB 6.5*  HCT 23.1*  MCV 73.3*  PLT 383*   Basic Metabolic Panel: Recent Labs  Lab 03/12/20 2001  NA 130*  K 4.5  CL 98  CO2 23  GLUCOSE 282*  BUN 20  CREATININE 1.06  CALCIUM  8.7*   GFR: Estimated Creatinine Clearance: 76.5 mL/min (by C-G formula based on SCr of 1.06 mg/dL). Liver Function Tests: No results for input(s): AST, ALT, ALKPHOS, BILITOT, PROT, ALBUMIN in the last 168 hours. No results for input(s): LIPASE, AMYLASE in the last 168 hours. No results for input(s): AMMONIA in the last 168 hours. Coagulation Profile: No results for input(s): INR, PROTIME in the last 168 hours. Cardiac Enzymes: No results for input(s): CKTOTAL, CKMB, CKMBINDEX, TROPONINI in the last 168 hours. BNP (last 3 results) No results for input(s): PROBNP in the last 8760 hours. HbA1C: No results for input(s): HGBA1C in the last 72 hours. CBG: No results for input(s): GLUCAP in the last 168 hours. Lipid Profile: No results for input(s): CHOL, HDL, LDLCALC, TRIG, CHOLHDL, LDLDIRECT in the last 72 hours. Thyroid Function Tests: No results for input(s): TSH, T4TOTAL, FREET4, T3FREE, THYROIDAB in the last 72 hours. Anemia Panel: Recent Labs    03/12/20 1707  FERRITIN 9*  TIBC 438  IRON 14*   Urine analysis:    Component Value Date/Time   APPEARANCEUR Clear 08/08/2018 1119   GLUCOSEU Negative 08/08/2018 1119   BILIRUBINUR Negative 08/08/2018 1119   PROTEINUR Negative 08/08/2018 1119   UROBILINOGEN negative 10/31/2014 1002   NITRITE Negative 08/08/2018 1119   LEUKOCYTESUR Negative 08/08/2018 1119     Imaging Results:      Radiological Exams on Admission: No results found.    Lynetta Mare MD [facility]  If 7PM-7AM, please contact night-coverage   03/12/2020, 10:31 PM

## 2020-03-12 NOTE — ED Triage Notes (Signed)
Pt sent to ED by Dr. Nona Dell office for low hemoglobin.   Pt has multiple toes on right foot that are gangrenous. Right foot is red, and swollen. Per pt wife- Pt needs surgery on right foot but pt cannot be operated on until hemoglobin increases.   Pt wife states pt as sent to [facility] ER to evaluate where pt is losing blood.

## 2020-03-12 NOTE — ED Notes (Signed)
Pt to CT

## 2020-03-12 NOTE — Progress Notes (Addendum)
Primary Care Physician:  [name], DO Primary Gastroenterologist:  Dr. Oneida Alar  Chief Complaint  Patient presents with  . Anemia    HPI:   Omar Todd is a 69 y.o. male who presents on referral from primary care for low hemoglobin.  Only recent information regarding referral is note dated 03/03/2020 indicating aortogram with bilateral lower extremity was canceled due to hemoglobin of 7.3.  The next day orders for referral to GI were entered.  No follow-up CBC since hemoglobin 7.3 approximately 2 weeks ago.  No indication of any obvious bleeding.  Review of CBC dated 02/28/2020 found hemoglobin 7.3 which is microcytic and hypochromic.  1 month prior fecal occult blood testing was negative and hemoglobin was 8.8.  Previous CBCs per below.  Today he states he's doing ok overall. He has a foot wound, but states it doesn't really bleed at all. Denies hematochezia, melena. Did have some foot wound bleeding a lot when he first got it 4 months ago. No other obvious bleeding has been noted. Had a colonoscopy sometime in the last 5 years at Dr. Tawanna Sat office in [place], [place] (though this appears to be [facility]). Then states it was in [place] at the hospital, but no [facility] endoscopic record could be found. Denies abdominal pain, N/V, fever, chills, unintentional weight loss. Energy level good. Denies fatigue, weakness, dizziness, dyspnea, chest pain, syncope, near syncope. Denies chest pain, dyspnea, dizziness, lightheadedness, syncope, near syncope. Denies any other upper or lower GI symptoms.  Takes Ibuprofen twice daily. Is also on ASA 81 mg. He is also on Voltaren and Mobic. Denies GERD symptoms. Also with ASA powders as needed for headache, about once a week. He is on Prilosec 72m daily.  CBC Latest Ref Rng & Units 02/28/2020 02/05/2020 09/12/2018  WBC 3.4 - 10.8 x10E3/uL 8.9 8.7 9.0  Hemoglobin 13.0 - 17.7 g/dL 7.3(L) 8.8(L) 11.7(L)  Hematocrit 37.5 - 51.0 % 25.1(L) 30.6(L) 35.3(L)    Platelets 150 - 450 x10E3/uL 374 357 347     Past Medical History:  Diagnosis Date  . COPD (chronic obstructive pulmonary disease) ([facility]   . Diabetes mellitus without complication ([facility]   . GERD (gastroesophageal reflux disease)   . Hyperlipidemia   . Hypertension     Past Surgical History:  Procedure Laterality Date  . Thumb surgery      Current Outpatient Medications  Medication Sig Dispense Refill  . Accu-Chek FastClix Lancets MISC 3 TIMES DAILY 102 each 3  . amLODipine (NORVASC) 10 MG tablet TAKE ONE (1) TABLET EACH DAY 90 tablet 1  . aspirin EC 81 MG tablet Take 81 mg by mouth daily.    .Marland KitchenBIOTIN 5000 PO Take 1 capsule by mouth daily.    . blood glucose meter kit and supplies Check BS TID and PRN dx.E11.9 1 each 1  . cholecalciferol (VITAMIN D) 1000 UNITS tablet Take 1,000 Units by mouth daily.    . collagenase (SANTYL) ointment Apply 1 application topically daily. Ulcers to right 4th sulcus, right 1st toe and right heel [place]. 30 g 5  . diclofenac (VOLTAREN) 75 MG EC tablet Take 75 mg by mouth 2 (two) times daily.    . empagliflozin (JARDIANCE) 25 MG TABS tablet Take 25 mg by mouth daily before breakfast. 30 tablet 3  . escitalopram (LEXAPRO) 10 MG tablet TAKE ONE (1) TABLET EACH DAY 90 tablet 0  . ferrous sulfate 325 (65 FE) MG tablet Take 1  325 mg total) by mouth daily. 15 tablet 0  . glucose blood (ACCU-CHEK GUIDE) test strip Check blood sugar 1-3 times daily as directed.  Dx E11.65 100 each 11  . hydrochlorothiazide (HYDRODIURIL) 25 MG tablet TAKE ONE (1) TABLET EACH DAY 90 tablet 3  . lisinopril (ZESTRIL) 40 MG tablet TAKE ONE (1) TABLET EACH DAY 90 tablet 1  . meloxicam (MOBIC) 7.5 MG tablet TAKE ONE TABLET BY MOUTH TWICE DAILY 180 tablet 0  . metFORMIN (GLUCOPHAGE) 1000 MG tablet TAKE ONE TABLET TWICE A DAY WITH FOOD 180 tablet 3  . metoprolol tartrate (LOPRESSOR) 25 MG tablet Take 1 tablet (25 mg total) by  mouth 2 (two) times daily. 180 tablet 3  . montelukast (SINGULAIR) 10 MG tablet TAKE ONE TABLET DAILY AT BEDTIME 90 tablet 1  . mupirocin cream (BACTROBAN) 2 % Apply 1 application topically 2 (two) times daily. 15 g 0  . omeprazole (PRILOSEC) 20 MG capsule TAKE ONE (1) CAPSULE EACH DAY 90 capsule 0  . pravastatin (PRAVACHOL) 40 MG tablet TAKE ONE (1) TABLET EACH DAY 90 tablet 1  . [name] (JANUVIA) 100 MG tablet Take 1 tablet (100 mg total) by mouth daily. 90 tablet 0   No current facility-administered medications for this visit.    Allergies as of 03/12/2020 - Review Complete 03/12/2020  Allergen Reaction Noted  . Penicillins Other (See Comments) 04/11/2013    Family History  Problem Relation Age of Onset  . Cancer Father   . Diabetes Sister   . Stroke Brother   . Healthy Sister   . Healthy Sister   . Colon cancer Neg Hx   . Gastric cancer Neg Hx     Social History   Socioeconomic History  . Marital status: Married    Spouse name: Rochelle  . Number of children: 3  . Years of education: 9th Grade  . Highest education level: 9th grade  Occupational History  . Occupation: Retired    Comment: [employer]  Tobacco Use  . Smoking status: Current Every Day Smoker    Packs/day: 0.50    Years: 60.00    Pack years: 30.00    Types: Cigarettes  . Smokeless tobacco: Never Used  Substance and Sexual Activity  . Alcohol use: No    Alcohol/week: 0.0 standard drinks  . Drug use: No  . Sexual activity: Yes    Birth control/protection: None  Other Topics Concern  . Not on file  Social History Narrative  . Not on file   Social Determinants of Health   Financial Resource Strain: Low Risk   . Difficulty of Paying Living Expenses: Not very hard  Food Insecurity: No Food Insecurity  . Worried About Running Out of Food in the Last Year: Never true  . Ran Out of Food in the Last Year: Never true  Transportation Needs: No Transportation Needs  . Lack of  Transportation (Medical): No  . Lack of Transportation (Non-Medical): No  Physical Activity: Inactive  . Days of Exercise per Week: 0 days  . Minutes of Exercise per Session: 0 min  Stress: No Stress Concern Present  . Feeling of Stress : Only a little  Social Connections: Not Isolated  . Frequency of Communication with Friends and Family: More than three times a week  . Frequency of Social Gatherings with Friends and Family: More than three times a week  . Attends Religious Services: More than 4 times per year  . Active Member of Clubs or Organizations:   Organizations: Yes  . Attends Archivist Meetings: More than 4 times per year  . Marital Status: Married  Human resources officer Violence: Not At Risk  . Fear of Current or Ex-Partner: No  . Emotionally Abused: No  . Physically Abused: No  . Sexually Abused: No    Subjective: Review of Systems  Constitutional: Negative for chills, fever, malaise/fatigue and weight loss.  HENT: Negative for congestion and sore throat.   Respiratory: Negative for cough and shortness of breath.   Cardiovascular: Negative for chest pain and palpitations.  Gastrointestinal: Negative for abdominal pain, blood in stool, diarrhea, melena, nausea and vomiting.  Musculoskeletal: Negative for joint pain and myalgias.       RLE foot pain due to chronic wound  Skin: Negative for rash.  Neurological: Negative for dizziness and weakness.  Endo/Heme/Allergies: Does not bruise/bleed easily.  Psychiatric/Behavioral: Negative for depression. The patient is not nervous/anxious.   All other systems reviewed and are negative.      Objective: BP 133/70   Pulse 82   Temp (!) 97.1 F (36.2 C) (Temporal)   Ht 6' 2" (1.88 m)   Wt 210 lb 8 oz (95.5 kg)   BMI 27.03 kg/m  Physical Exam Vitals and nursing note reviewed.  Constitutional:      General: He is not in acute distress.    Appearance: Normal appearance. He is normal weight. He is not ill-appearing, toxic-appearing  or diaphoretic.  HENT:     Head: Normocephalic and atraumatic.     Nose: No congestion or rhinorrhea.  Eyes:     General: No scleral icterus. Cardiovascular:     Rate and Rhythm: Normal rate and regular rhythm.     Heart sounds: Murmur present. Systolic murmur present with a grade of 4/6.  Pulmonary:     Effort: Pulmonary effort is normal.     Breath sounds: Normal breath sounds.  Abdominal:     General: Bowel sounds are normal. There is no distension.     Palpations: Abdomen is soft. There is no hepatomegaly, splenomegaly or mass.     Tenderness: There is no abdominal tenderness. There is no guarding or rebound.     Hernia: No hernia is present.  Musculoskeletal:     Cervical back: Neck supple.  Feet:     Comments: Wrap and boot in place right foot Skin:    General: Skin is warm and dry.     Coloration: Skin is not jaundiced.     Findings: No bruising or rash.  Neurological:     General: No focal deficit present.     Mental Status: He is alert and oriented to person, place, and time. Mental status is at baseline.  Psychiatric:        Mood and Affect: Mood normal.        Behavior: Behavior normal.        Thought Content: Thought content normal.       03/12/2020 3:43 PM   Disclaimer: This note was dictated with voice recognition software. Similar sounding words can inadvertently be transcribed and may not be corrected upon review.

## 2020-03-12 NOTE — ED Provider Notes (Signed)
[facility] EMERGENCY DEPARTMENT Provider Note   CSN: [id number] Arrival date & time: 03/12/20  1756     History Chief Complaint  Patient presents with   Anemia    Omar Todd is a 69 y.o. male who presents here essentially secondary to profound anemia.  Patient has a history of diabetes, GERD, COPD and hypertension, he also has a chronic wound on his right foot and is scheduled for lower extremity angiograms with anticipation of possible stenting.  He was found to have a hemoglobin of 7.3 on April 9 when seen by his podiatrist and was referred to gastroenterology whom he saw today in anticipation of arranging an endoscopy/colonoscopy given his anemia.  He was seen there today and repeat lab tests confirm a drop in his hemoglobin to 6.5.  He was sent here for admission for blood transfusion.  In addition to the CBC collected at the GI office, iron panel including ferritin level was also ordered.  Patient does endorse taking daily ibuprofen, diclofenac, meloxicam and also an over-the-counter baby aspirin.  He denies abdominal pain, nausea, vomiting, GERD-like symptoms.  He also denies rectal bleeding, hematochezia or melena.  He has had increasing fatigue over the past several weeks.    The history is provided by the patient.       Past Medical History:  Diagnosis Date   COPD (chronic obstructive pulmonary disease) ([place])    Diabetes mellitus without complication (HCC)    Gangrene of toe of right foot ([name])    GERD (gastroesophageal reflux disease)    Hyperlipidemia    Hypertension     Patient Active Problem List   Diagnosis Date Noted   Anemia 03/12/2020   GI bleed 03/12/2020   AAA (abdominal aortic aneurysm) without rupture ([place]) 02/28/2020   Critical limb ischemia with history of revascularization of same extremity 02/28/2020   BPH (benign prostatic hyperplasia) 0000000   Systolic ejection murmur 0000000   Bilateral carotid bruits 10/31/2014    Erectile dysfunction 11/05/2013   Bilateral carotid artery disease ([place]) 04/11/2013   Hyperlipidemia associated with type 2 diabetes mellitus ([place]) 04/11/2013   Hypertension associated with diabetes ([place]) 04/11/2013   COPD exacerbation ([place]) 04/11/2013   Diabetes mellitus type 2 controlled 04/11/2013   Diabetic neuropathy, type II diabetes mellitus ([place]) 03/15/2013    Past Surgical History:  Procedure Laterality Date   Thumb surgery         Family History  Problem Relation Age of Onset   Cancer Father    Diabetes Sister    Stroke Brother    Healthy Sister    Healthy Sister    Colon cancer Neg Hx    Gastric cancer Neg Hx     Social History   Tobacco Use   Smoking status: Current Every Day Smoker    Packs/day: 0.50    Years: 60.00    Pack years: 30.00    Types: Cigarettes   Smokeless tobacco: Never Used  Substance Use Topics   Alcohol use: No    Alcohol/week: 0.0 standard drinks   Drug use: No    Home Medications Prior to Admission medications   Medication Sig Start Date End Date Taking? Authorizing Provider  amLODipine (NORVASC) 10 MG tablet TAKE ONE (1) TABLET EACH DAY Patient taking differently: Take 10 mg by mouth daily.  08/21/19  Yes Ronnie Doss M, DO  aspirin EC 81 MG tablet Take 81 mg by mouth daily.   Yes [provider]  BIOTIN 5000 PO Take  1 capsule by mouth daily.   Yes [provider]  cholecalciferol (VITAMIN D) 1000 UNITS tablet Take 1,000 Units by mouth daily.   Yes [provider]  collagenase (SANTYL) ointment Apply 1 application topically daily. Ulcers to right 4th sulcus, right 1st toe and right heel measurements sent on [place] form. Patient taking differently: Apply 1 application topically See admin instructions. Ulcers to right 4th sulcus, right 1st toe and right heel measurements sent on [place] form. 02/14/20  Yes Evelina Bucy, DPM  diclofenac  (VOLTAREN) 75 MG EC tablet Take 75 mg by mouth 2 (two) times daily. 12/02/19  Yes [provider]  empagliflozin (JARDIANCE) 25 MG TABS tablet Take 25 mg by mouth daily before breakfast. 08/21/19  Yes Gottschalk, Ashly M, DO  escitalopram (LEXAPRO) 10 MG tablet TAKE ONE (1) TABLET EACH DAY Patient taking differently: Take 10 mg by mouth daily.  10/01/19  Yes Gottschalk, Ashly M, DO  hydrochlorothiazide (HYDRODIURIL) 25 MG tablet TAKE ONE (1) TABLET EACH DAY Patient taking differently: Take 25 mg by mouth daily.  04/05/19  Yes Gottschalk, Leatrice Jewels M, DO  lisinopril (ZESTRIL) 40 MG tablet TAKE ONE (1) TABLET EACH DAY 08/21/19  Yes Gottschalk, Ashly M, DO  metFORMIN (GLUCOPHAGE) 1000 MG tablet TAKE ONE TABLET TWICE A DAY WITH FOOD Patient taking differently: Take 1,000 mg by mouth 2 (two) times daily with a meal.  04/05/19  Yes Gottschalk, Ashly M, DO  metoprolol tartrate (LOPRESSOR) 25 MG tablet Take 1 tablet (25 mg total) by mouth 2 (two) times daily. 04/05/19  Yes Gottschalk, Ashly M, DO  montelukast (SINGULAIR) 10 MG tablet TAKE ONE TABLET DAILY AT BEDTIME Patient taking differently: Take 10 mg by mouth at bedtime.  01/07/20  Yes Gottschalk, Ashly M, DO  omeprazole (PRILOSEC) 20 MG capsule TAKE ONE (1) [place] Patient taking differently: Take 20 mg by mouth in the morning.  12/13/19  Yes Gottschalk, Ashly M, DO  pravastatin (PRAVACHOL) 40 MG tablet TAKE ONE (1) TABLET EACH DAY Patient taking differently: Take 40 mg by mouth every evening.  08/21/19  Yes Gottschalk, Ashly M, DO  [name] (JANUVIA) 100 MG tablet Take 1 tablet (100 mg total) by mouth daily. 01/29/20  Yes Ronnie Doss M, DO  ferrous sulfate 325 (65 FE) MG tablet Take 1 tablet (325 mg total) by mouth daily. Patient not taking: Reported on 03/12/2020 02/05/20   [place] Lanius A, PA-C  meloxicam (MOBIC) 7.5 MG tablet TAKE ONE TABLET BY MOUTH TWICE DAILY Patient not taking: No sig reported 12/13/19   [name], DO    mupirocin cream (BACTROBAN) 2 % Apply 1 application topically 2 (two) times daily. Patient not taking: Reported on 03/12/2020 02/05/20   Volanda Napoleon, PA-C    Allergies    Penicillins  Review of Systems   Review of Systems  Constitutional: Positive for fatigue. Negative for fever.  HENT: Negative for congestion and sore throat.   Eyes: Negative.   Respiratory: Negative for chest tightness and shortness of breath.   Cardiovascular: Negative for chest pain.  Gastrointestinal: Negative for abdominal pain and nausea.  Genitourinary: Negative.   Musculoskeletal: Negative for arthralgias, joint swelling and neck pain.  Skin: Positive for wound. Negative for rash.       Chronic wounds on right foot secondary to ischemia.  Neurological: Negative for dizziness, weakness, light-headedness, numbness and headaches.  Psychiatric/Behavioral: Negative.     Physical Exam Updated Vital Signs BP 138/67  Pulse 84    Temp 98.3 F (36.8 C) (Oral)    Resp 16    Ht 6\' 2"  (1.88 m)    Wt 94.3 kg    SpO2 94%    BMI 26.71 kg/m   Physical Exam Vitals and nursing note reviewed.  Constitutional:      Appearance: He is well-developed.  HENT:     Head: Normocephalic and atraumatic.  Eyes:     Conjunctiva/sclera: Conjunctivae normal.  Cardiovascular:     Rate and Rhythm: Normal rate and regular rhythm.     Pulses:          Dorsalis pedis pulses are detected w/ Doppler on the right side.     Heart sounds: Normal heart sounds.     Comments: Chronic appearing wounds on toes.  Sluggish cap refill in toes, no pallor or dusky appearance to skin.  Pulmonary:     Effort: Pulmonary effort is normal.     Breath sounds: Normal breath sounds. No wheezing.  Abdominal:     General: Bowel sounds are normal.     Palpations: Abdomen is soft.     Tenderness: There is no abdominal tenderness.  Musculoskeletal:        General: Normal range of motion.     Cervical back: Normal range of motion.     Right lower  leg: 1+ Edema present.     Left lower leg: 1+ Edema present.  Skin:    General: Skin is warm and dry.  Neurological:     Mental Status: He is alert.     ED Results / Procedures / Treatments   Labs (all labs ordered are listed, but only abnormal results are displayed) Labs Reviewed  BASIC METABOLIC PANEL - Abnormal; Notable for the following components:      Result Value   Sodium 130 (*)    Glucose, Bld 282 (*)    Calcium 8.7 (*)    All other components within normal limits  POC OCCULT BLOOD, ED - Abnormal; Notable for the following components:   Fecal Occult Bld POSITIVE (*)    All other components within normal limits  RESPIRATORY PANEL BY RT PCR (FLU A&B, COVID)  TYPE AND SCREEN  PREPARE RBC (CROSSMATCH)  ABO/RH   Refer to cbc obtained in GI office prior to arrival here today.  hgb 6.5 with hct 23.1, mcv 73.3.platelets 413, wbc 9.4.   EKG None  Radiology CT ANGIO AO+BIFEM W & OR WO CONTRAST  Result Date: 03/13/2020 CLINICAL DATA:  70 year old male with claudication. EXAM: CT ANGIOGRAPHY OF ABDOMINAL AORTA WITH ILIOFEMORAL RUNOFF TECHNIQUE: Multidetector CT imaging of the abdomen, pelvis and lower extremities was performed using the standard protocol during bolus administration of intravenous contrast. Multiplanar CT image reconstructions and MIPs were obtained to evaluate the vascular anatomy. CONTRAST:  128mL OMNIPAQUE IOHEXOL 350 MG/ML SOLN COMPARISON:  None. FINDINGS: Evaluation of this exam is limited due to respiratory motion artifact. VASCULAR Aorta: There is advanced atherosclerotic calcification of the abdominal aorta. There is a 4.7 cm fusiform infrarenal abdominal aortic aneurysm measuring approximately 10 cm in craniocaudal length and extends to the level of aortic bifurcation. There is thrombosis of the majority of the lumen of the aneurysm in the periphery. Evaluation of the aneurysm however is limited as precontrast and delayed images are not performed. No  periaortic inflammation, fluid collection, or hematoma. Celiac: There is atherosclerotic calcification of the origin of the celiac axis. The celiac artery and its major branches appear patent.  SMA: There is atherosclerotic calcification of the origin of the SMA. The SMA remains patent. Renals: Evaluation of the renal arteries is somewhat limited due to suboptimal opacification and respiratory motion. There is atherosclerotic calcification of the origins of the renal arteries. The renal arteries remain patent. IMA: The IMA is not well visualized. RIGHT Lower Extremity Inflow: There is narrowing of the proximal right common iliac artery due to advanced atherosclerotic disease. There is advanced atherosclerotic calcification of the iliac arteries. Right external iliac artery remains patent. There is diminished flow in the right internal iliac artery. Outflow: There is advanced atherosclerotic calcification of the right common femoral artery, and superficial femoral artery. There is associated luminal narrowing with decreased flow. Severe atherosclerotic calcification of the distal aspect of the superficial femoral artery with decreased flow. The right popliteal artery is patent but with decreased flow. Runoff: There is atherosclerotic calcification of the proximal arteries of the calf with diminished flow. There is flow within the anterior and posterior tibial arteries to the level of the ankle although with diminished flow. There is flow within the fibular artery. Flow is noted within the visualized proximal portion of the right dorsalis pedis and plantar arteries, although diminished. LEFT Lower Extremity Inflow: There is severe atherosclerotic calcification of the proximal portion of the left common iliac artery. There is advanced atherosclerotic calcification of the iliac arteries on the left. The left external iliac artery remains patent. There is diminished flow in the left internal iliac artery. Outflow: There  is advanced atherosclerotic disease in the left common femoral, and superficial femoral artery. There is associated luminal narrowing with diminished flow within these vessels. The deep femoral artery is patent. The popliteal artery is patent although with diminished flow due to advanced atherosclerotic disease and luminal narrowing. Runoff: There is atherosclerotic calcification of the trifurcation of the popliteal artery and proximal portion of the calf vessels. The anterior and posterior tibial arteries as well as the fibular artery are patent to the level of the ankle. The dorsalis pedis and plantar arteries are patent as visualized. Veins: No obvious venous abnormality within the limitations of this arterial phase study. Review of the MIP images confirms the above findings. NON-VASCULAR Lower chest: There are bibasilar dependent atelectasis. There is mild cardiomegaly. Coronary vascular calcifications noted. No intra-abdominal free air or free fluid. Hepatobiliary: The liver is unremarkable. No intrahepatic biliary ductal dilatation. The gallbladder is unremarkable. Pancreas: Unremarkable. No pancreatic ductal dilatation or surrounding inflammatory changes. Spleen: Normal in size without focal abnormality. Adrenals/Urinary Tract: The adrenal glands are unremarkable. There is no hydronephrosis on either side. Small bilateral renal hypodense lesions are too small to characterize. The visualized ureters and urinary bladder appear unremarkable. Stomach/Bowel: There is no bowel obstruction or active inflammation. The appendix is normal. Lymphatic: No adenopathy. Reproductive: Enlarged prostate gland measuring 5.2 cm in transverse axial diameter. Other: None Musculoskeletal: Degenerative changes of the spine. No acute osseous pathology. There is diffuse subcutaneous edema of the distal lower extremity and feet, right greater than left. IMPRESSION: 1. 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 Evaluation of the aneurysm is limited as precontrast and delayed images are not performed. No periaortic inflammation or fluid. Clinical correlation and vascular surgery consult is advised. 2. Advanced atherosclerotic calcification of the aorta, iliac arteries, and arteries of the bilateral lower extremities. There is associated luminal narrowing of the vessels of the lower extremities with diminished flow. There is greater involvement of the right lower extremity vessels with more  significant diminished inflow in the right lower extremity compared to the left. Flow noted to the level of the ankles bilaterally although significantly diminished on the right. 3. Diffuse subcutaneous edema of the distal calf and feet, right greater than left. 4. No bowel obstruction. Normal appendix. 5. Enlarged prostate gland. Electronically Signed   By: Anner Crete M.D.   On: 03/13/2020 00:15    Procedures Procedures (including critical care time)  Medications Ordered in ED Medications  0.9 %  sodium chloride infusion (has no administration in time range)  iohexol (OMNIPAQUE) 350 MG/ML injection 150 mL (150 mLs Intravenous Contrast Given 03/12/20 2258)    ED Course  I have reviewed the triage vital signs and the nursing notes.  Pertinent labs & imaging results that were available during my care of the patient were reviewed by me and considered in my medical decision making (see chart for details).    MDM Rules/Calculators/A&P                      Patient presenting essentially for treatment of known profound anemia with a hemoglobin of 6.5 and his GI office this afternoon.  He is Hemoccult positive today.  He was sent here for blood transfusion with plans for GI consult tomorrow in anticipation of endoscopy/colonoscopy.  Patient will require hospitalist admission.  Spoke with hospitalist Dr. Scherrie November who requested CT angio with runoff test to further evaluate patient's vascular status in his lower  extremities and also to evaluate the abdominal aneurysm noted in a note by Dr. Gwenlyn Found on April 9 per bedside ultrasound study.  This was completed and reflects a thrombus within the aneurysm.  Dr. Scherrie November had concerns for need for heparin given thrombus in this anemic pt with GI bleeding and requested Vascular consult.   This finding was discussed with Dr. Carlis Abbott of the vascular service who reviewed the CT findings and explained that this is a normal finding within aneurysms and the patient does not need anticoagulation for this condition.  Pt can stay at [facility] for his care since no emergent vascular intervention is indicated.   Dr Scherrie November accepts care of pt.   Pt's transfusions were started, 2 units ordered initially.   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

## 2020-03-12 NOTE — H&P (View-Only) (Signed)
Primary Care Physician:  [name], DO Primary Gastroenterologist:  Dr. Oneida Alar  Chief Complaint  Patient presents with  . Anemia    HPI:   Omar Todd is a 69 y.o. male who presents on referral from primary care for low hemoglobin.  Only recent information regarding referral is note dated 03/03/2020 indicating aortogram with bilateral lower extremity was canceled due to hemoglobin of 7.3.  The next day orders for referral to GI were entered.  No follow-up CBC since hemoglobin 7.3 approximately 2 weeks ago.  No indication of any obvious bleeding.  Review of CBC dated 02/28/2020 found hemoglobin 7.3 which is microcytic and hypochromic.  1 month prior fecal occult blood testing was negative and hemoglobin was 8.8.  Previous CBCs per below.  Today he states he's doing ok overall. He has a foot wound, but states it doesn't really bleed at all. Denies hematochezia, melena. Did have some foot wound bleeding a lot when he first got it 4 months ago. No other obvious bleeding has been noted. Had a colonoscopy sometime in the last 5 years at Dr. Tawanna Sat office in [place], [place] (though this appears to be [facility]). Then states it was in [place] at the hospital, but no [facility] endoscopic record could be found. Denies abdominal pain, N/V, fever, chills, unintentional weight loss. Energy level good. Denies fatigue, weakness, dizziness, dyspnea, chest pain, syncope, near syncope. Denies chest pain, dyspnea, dizziness, lightheadedness, syncope, near syncope. Denies any other upper or lower GI symptoms.  Takes Ibuprofen twice daily. Is also on ASA 81 mg. He is also on Voltaren and Mobic. Denies GERD symptoms. Also with ASA powders as needed for headache, about once a week. He is on Prilosec 8m daily.  CBC Latest Ref Rng & Units 02/28/2020 02/05/2020 09/12/2018  WBC 3.4 - 10.8 x10E3/uL 8.9 8.7 9.0  Hemoglobin 13.0 - 17.7 g/dL 7.3(L) 8.8(L) 11.7(L)  Hematocrit 37.5 - 51.0 % 25.1(L) 30.6(L) 35.3(L)   Platelets 150 - 450 x10E3/uL 374 357 347     Past Medical History:  Diagnosis Date  . COPD (chronic obstructive pulmonary disease) ([place]   . Diabetes mellitus without complication ([place]   . GERD (gastroesophageal reflux disease)   . Hyperlipidemia   . Hypertension     Past Surgical History:  Procedure Laterality Date  . Thumb surgery      Current Outpatient Medications  Medication Sig Dispense Refill  . Accu-Chek FastClix Lancets MISC 3 TIMES DAILY 102 each 3  . amLODipine (NORVASC) 10 MG tablet TAKE ONE (1) TABLET EACH DAY 90 tablet 1  . aspirin EC 81 MG tablet Take 81 mg by mouth daily.    .Marland KitchenBIOTIN 5000 PO Take 1 capsule by mouth daily.    . blood glucose meter kit and supplies Check BS TID and PRN dx.E11.9 1 each 1  . cholecalciferol (VITAMIN D) 1000 UNITS tablet Take 1,000 Units by mouth daily.    . collagenase (SANTYL) ointment Apply 1 application topically daily. Ulcers to right 4th sulcus, right 1st toe and right heel measurements [place] [facility]. 30 g 5  . diclofenac (VOLTAREN) 75 MG EC tablet Take 75 mg by mouth 2 (two) times daily.    . empagliflozin (JARDIANCE) 25 MG TABS tablet Take 25 mg by mouth daily before breakfast. 30 tablet 3  . escitalopram (LEXAPRO) 10 MG tablet TAKE ONE (1) TABLET EACH DAY 90 tablet 0  . ferrous sulfate 325 (65 FE) MG tablet Take 1 tablet (  325 mg total) by mouth daily. 15 tablet 0  . glucose blood (ACCU-CHEK GUIDE) test strip Check blood sugar 1-3 times daily as directed.  Dx E11.65 100 each 11  . hydrochlorothiazide (HYDRODIURIL) 25 MG tablet TAKE ONE (1) TABLET EACH DAY 90 tablet 3  . lisinopril (ZESTRIL) 40 MG tablet TAKE ONE (1) TABLET EACH DAY 90 tablet 1  . meloxicam (MOBIC) 7.5 MG tablet TAKE ONE TABLET BY MOUTH TWICE DAILY 180 tablet 0  . metFORMIN (GLUCOPHAGE) 1000 MG tablet TAKE ONE TABLET TWICE A DAY WITH FOOD 180 tablet 3  . metoprolol tartrate (LOPRESSOR) 25 MG tablet Take 1 tablet (25 mg total) by  mouth 2 (two) times daily. 180 tablet 3  . montelukast (SINGULAIR) 10 MG tablet TAKE ONE TABLET DAILY AT BEDTIME 90 tablet 1  . mupirocin cream (BACTROBAN) 2 % Apply 1 application topically 2 (two) times daily. 15 g 0  . omeprazole (PRILOSEC) 20 MG capsule TAKE ONE (1) CAPSULE EACH DAY 90 capsule 0  . pravastatin (PRAVACHOL) 40 MG tablet TAKE ONE (1) TABLET EACH DAY 90 tablet 1  . [name] (JANUVIA) 100 MG tablet Take 1 tablet (100 mg total) by mouth daily. 90 tablet 0   No current facility-administered medications for this visit.    Allergies as of 03/12/2020 - Review Complete 03/12/2020  Allergen Reaction Noted  . Penicillins Other (See Comments) 04/11/2013    Family History  Problem Relation Age of Onset  . Cancer Father   . Diabetes Sister   . Stroke Brother   . Healthy Sister   . Healthy Sister   . Colon cancer Neg Hx   . Gastric cancer Neg Hx     Social History   Socioeconomic History  . Marital status: Married    Spouse name: [name]  . Number of children: 3  . Years of education: 9th Grade  . Highest education level: 9th grade  Occupational History  . Occupation: Retired    Comment: Science writer  Tobacco Use  . Smoking status: Current Every Day Smoker    Packs/day: 0.50    Years: 60.00    Pack years: 30.00    Types: Cigarettes  . Smokeless tobacco: Never Used  Substance and Sexual Activity  . Alcohol use: No    Alcohol/week: 0.0 standard drinks  . Drug use: No  . Sexual activity: Yes    Birth control/protection: None  Other Topics Concern  . Not on file  Social History Narrative  . Not on file   Social Determinants of Health   Financial Resource Strain: Low Risk   . Difficulty of Paying Living Expenses: Not very hard  Food Insecurity: No Food Insecurity  . Worried About Charity fundraiser in the Last Year: Never true  . Ran Out of Food in the Last Year: Never true  Transportation Needs: No Transportation Needs  . Lack of  Transportation (Medical): No  . Lack of Transportation (Non-Medical): No  Physical Activity: Inactive  . Days of Exercise per Week: 0 days  . Minutes of Exercise per Session: 0 min  Stress: No Stress Concern Present  . Feeling of Stress : Only a little  Social Connections: Not Isolated  . Frequency of Communication with Friends and Family: More than three times a week  . Frequency of Social Gatherings with Friends and Family: More than three times a week  . Attends Religious Services: More than 4 times per year  . Active Member of Clubs or Organizations:  Yes  . Attends Club or Organization Meetings: More than 4 times per year  . Marital Status: Married  Human resources officer Violence: Not At Risk  . Fear of Current or Ex-Partner: No  . Emotionally Abused: No  . Physically Abused: No  . Sexually Abused: No    Subjective: Review of Systems  Constitutional: Negative for chills, fever, malaise/fatigue and weight loss.  HENT: Negative for congestion and sore throat.   Respiratory: Negative for cough and shortness of breath.   Cardiovascular: Negative for chest pain and palpitations.  Gastrointestinal: Negative for abdominal pain, blood in stool, diarrhea, melena, nausea and vomiting.  Musculoskeletal: Negative for joint pain and myalgias.       RLE foot pain due to chronic wound  Skin: Negative for rash.  Neurological: Negative for dizziness and weakness.  Endo/Heme/Allergies: Does not bruise/bleed easily.  Psychiatric/Behavioral: Negative for depression. The patient is not nervous/anxious.   All other systems reviewed and are negative.      Objective: BP 133/70   Pulse 82   Temp (!) 97.1 F (36.2 C) (Temporal)   Ht '6\' 2"'  (1.88 m)   Wt 210 lb 8 oz (95.5 kg)   BMI 27.03 kg/m  Physical Exam Vitals and nursing note reviewed.  Constitutional:      General: He is not in acute distress.    Appearance: Normal appearance. He is normal weight. He is not ill-appearing, toxic-appearing  or diaphoretic.  HENT:     Head: Normocephalic and atraumatic.     Nose: No congestion or rhinorrhea.  Eyes:     General: No scleral icterus. Cardiovascular:     Rate and Rhythm: Normal rate and regular rhythm.     Heart sounds: Murmur present. Systolic murmur present with a grade of 4/6.  Pulmonary:     Effort: Pulmonary effort is normal.     Breath sounds: Normal breath sounds.  Abdominal:     General: Bowel sounds are normal. There is no distension.     Palpations: Abdomen is soft. There is no hepatomegaly, splenomegaly or mass.     Tenderness: There is no abdominal tenderness. There is no guarding or rebound.     Hernia: No hernia is present.  Musculoskeletal:     Cervical back: Neck supple.  Feet:     Comments: Wrap and boot in place right foot Skin:    General: Skin is warm and dry.     Coloration: Skin is not jaundiced.     Findings: No bruising or rash.  Neurological:     General: No focal deficit present.     Mental Status: He is alert and oriented to person, place, and time. Mental status is at baseline.  Psychiatric:        Mood and Affect: Mood normal.        Behavior: Behavior normal.        Thought Content: Thought content normal.       03/12/2020 3:43 PM   Disclaimer: This note was dictated with voice recognition software. Similar sounding words can inadvertently be transcribed and may not be corrected upon review.

## 2020-03-12 NOTE — Telephone Encounter (Signed)
I was able to speak with the patient and his sister around 5:30pm. Directed them to the ER. Likely UGI bleed based on office visit today (multiple NSAIDs, significantly low iron sats and ferritin). I anticipate he will be admitted and if so can consult tomorrow.

## 2020-03-13 ENCOUNTER — Encounter (HOSPITAL_COMMUNITY): Payer: Self-pay | Admitting: Anesthesiology

## 2020-03-13 ENCOUNTER — Ambulatory Visit: Payer: Medicare Other | Admitting: Podiatry

## 2020-03-13 ENCOUNTER — Other Ambulatory Visit: Payer: Self-pay

## 2020-03-13 ENCOUNTER — Encounter: Payer: Self-pay | Admitting: Podiatry

## 2020-03-13 ENCOUNTER — Inpatient Hospital Stay (HOSPITAL_COMMUNITY): Payer: Medicare Other

## 2020-03-13 ENCOUNTER — Encounter: Payer: Self-pay | Admitting: Internal Medicine

## 2020-03-13 ENCOUNTER — Encounter (HOSPITAL_COMMUNITY): Payer: Self-pay | Admitting: Internal Medicine

## 2020-03-13 ENCOUNTER — Encounter (HOSPITAL_COMMUNITY)
Admission: EM | Discharge status: Against Medical Advice | Payer: Self-pay | Source: Home / Self Care | Attending: Family Medicine

## 2020-03-13 DIAGNOSIS — E1151 Type 2 diabetes mellitus with diabetic peripheral angiopathy without gangrene: Secondary | ICD-10-CM | POA: Diagnosis present

## 2020-03-13 DIAGNOSIS — I700 Atherosclerosis of aorta: Secondary | ICD-10-CM | POA: Diagnosis present

## 2020-03-13 DIAGNOSIS — Z7982 Long term (current) use of aspirin: Secondary | ICD-10-CM | POA: Diagnosis not present

## 2020-03-13 DIAGNOSIS — K922 Gastrointestinal hemorrhage, unspecified: Secondary | ICD-10-CM

## 2020-03-13 DIAGNOSIS — Z7984 Long term (current) use of oral hypoglycemic drugs: Secondary | ICD-10-CM | POA: Diagnosis not present

## 2020-03-13 DIAGNOSIS — Z823 Family history of stroke: Secondary | ICD-10-CM | POA: Diagnosis not present

## 2020-03-13 DIAGNOSIS — I10 Essential (primary) hypertension: Secondary | ICD-10-CM | POA: Diagnosis not present

## 2020-03-13 DIAGNOSIS — Z20822 Contact with and (suspected) exposure to covid-19: Secondary | ICD-10-CM | POA: Diagnosis present

## 2020-03-13 DIAGNOSIS — I152 Hypertension secondary to endocrine disorders: Secondary | ICD-10-CM | POA: Diagnosis present

## 2020-03-13 DIAGNOSIS — L03115 Cellulitis of right lower limb: Secondary | ICD-10-CM | POA: Diagnosis present

## 2020-03-13 DIAGNOSIS — I714 Abdominal aortic aneurysm, without rupture: Secondary | ICD-10-CM | POA: Diagnosis present

## 2020-03-13 DIAGNOSIS — I350 Nonrheumatic aortic (valve) stenosis: Secondary | ICD-10-CM

## 2020-03-13 DIAGNOSIS — D500 Iron deficiency anemia secondary to blood loss (chronic): Secondary | ICD-10-CM

## 2020-03-13 DIAGNOSIS — E1169 Type 2 diabetes mellitus with other specified complication: Secondary | ICD-10-CM | POA: Diagnosis present

## 2020-03-13 DIAGNOSIS — R0902 Hypoxemia: Secondary | ICD-10-CM | POA: Diagnosis present

## 2020-03-13 DIAGNOSIS — K921 Melena: Principal | ICD-10-CM

## 2020-03-13 DIAGNOSIS — E785 Hyperlipidemia, unspecified: Secondary | ICD-10-CM | POA: Diagnosis present

## 2020-03-13 DIAGNOSIS — J449 Chronic obstructive pulmonary disease, unspecified: Secondary | ICD-10-CM | POA: Diagnosis present

## 2020-03-13 DIAGNOSIS — Z79899 Other long term (current) drug therapy: Secondary | ICD-10-CM | POA: Diagnosis not present

## 2020-03-13 DIAGNOSIS — E1159 Type 2 diabetes mellitus with other circulatory complications: Secondary | ICD-10-CM | POA: Diagnosis not present

## 2020-03-13 DIAGNOSIS — F17210 Nicotine dependence, cigarettes, uncomplicated: Secondary | ICD-10-CM | POA: Diagnosis present

## 2020-03-13 DIAGNOSIS — D649 Anemia, unspecified: Secondary | ICD-10-CM

## 2020-03-13 DIAGNOSIS — I351 Nonrheumatic aortic (valve) insufficiency: Secondary | ICD-10-CM | POA: Diagnosis not present

## 2020-03-13 DIAGNOSIS — Z833 Family history of diabetes mellitus: Secondary | ICD-10-CM | POA: Diagnosis not present

## 2020-03-13 DIAGNOSIS — E1152 Type 2 diabetes mellitus with diabetic peripheral angiopathy with gangrene: Secondary | ICD-10-CM | POA: Diagnosis present

## 2020-03-13 DIAGNOSIS — I352 Nonrheumatic aortic (valve) stenosis with insufficiency: Secondary | ICD-10-CM | POA: Diagnosis present

## 2020-03-13 DIAGNOSIS — F329 Major depressive disorder, single episode, unspecified: Secondary | ICD-10-CM | POA: Diagnosis present

## 2020-03-13 DIAGNOSIS — I6523 Occlusion and stenosis of bilateral carotid arteries: Secondary | ICD-10-CM | POA: Diagnosis not present

## 2020-03-13 DIAGNOSIS — N400 Enlarged prostate without lower urinary tract symptoms: Secondary | ICD-10-CM | POA: Diagnosis present

## 2020-03-13 DIAGNOSIS — K2950 Unspecified chronic gastritis without bleeding: Secondary | ICD-10-CM | POA: Diagnosis not present

## 2020-03-13 DIAGNOSIS — Z5329 Procedure and treatment not carried out because of patient's decision for other reasons: Secondary | ICD-10-CM | POA: Diagnosis present

## 2020-03-13 DIAGNOSIS — E1165 Type 2 diabetes mellitus with hyperglycemia: Secondary | ICD-10-CM | POA: Diagnosis present

## 2020-03-13 DIAGNOSIS — E1140 Type 2 diabetes mellitus with diabetic neuropathy, unspecified: Secondary | ICD-10-CM | POA: Diagnosis present

## 2020-03-13 HISTORY — PX: ESOPHAGOGASTRODUODENOSCOPY: SHX5428

## 2020-03-13 HISTORY — PX: BIOPSY: SHX5522

## 2020-03-13 LAB — BASIC METABOLIC PANEL
Anion gap: 11 (ref 5–15)
BUN: 15 mg/dL (ref 8–23)
CO2: 25 mmol/L (ref 22–32)
Calcium: 9 mg/dL (ref 8.9–10.3)
Chloride: 98 mmol/L (ref 98–111)
Creatinine, Ser: 0.85 mg/dL (ref 0.61–1.24)
GFR calc Af Amer: >60 mL/min (ref >60)
GFR calc non Af Amer: >60 mL/min (ref >60)
Glucose, Bld: 187 mg/dL — ABNORMAL HIGH (ref 70–99)
Potassium: 4.4 mmol/L (ref 3.5–5.1)
Sodium: 134 mmol/L — ABNORMAL LOW (ref 135–145)

## 2020-03-13 LAB — CBC
HCT: 28.7 % — ABNORMAL LOW (ref 39.0–52.0)
Hemoglobin: 8.4 g/dL — ABNORMAL LOW (ref 13.0–17.0)
MCH: 22 pg — ABNORMAL LOW (ref 26.0–34.0)
MCHC: 29.3 g/dL — ABNORMAL LOW (ref 30.0–36.0)
MCV: 75.1 fL — ABNORMAL LOW (ref 80.0–100.0)
Platelets: 387 10*3/uL (ref 150–400)
RBC: 3.82 MIL/uL — ABNORMAL LOW (ref 4.22–5.81)
RDW: 18.9 % — ABNORMAL HIGH (ref 11.5–15.5)
WBC: 8.3 10*3/uL (ref 4.0–10.5)
nRBC: 0 % (ref 0.0–0.2)

## 2020-03-13 LAB — ECHOCARDIOGRAM COMPLETE
Height: 74 in
Weight: 3379.21 oz

## 2020-03-13 LAB — GLUCOSE, CAPILLARY
Glucose-Capillary: 160 mg/dL — ABNORMAL HIGH (ref 70–99)
Glucose-Capillary: 177 mg/dL — ABNORMAL HIGH (ref 70–99)
Glucose-Capillary: 182 mg/dL — ABNORMAL HIGH (ref 70–99)
Glucose-Capillary: 183 mg/dL — ABNORMAL HIGH (ref 70–99)
Glucose-Capillary: 184 mg/dL — ABNORMAL HIGH (ref 70–99)

## 2020-03-13 LAB — SEDIMENTATION RATE: Sed Rate: 54 mm/hr — ABNORMAL HIGH (ref 0–16)

## 2020-03-13 LAB — HIV ANTIBODY (ROUTINE TESTING W REFLEX): HIV Screen 4th Generation wRfx: NONREACTIVE

## 2020-03-13 LAB — PROCALCITONIN: Procalcitonin: <0.1 ng/mL

## 2020-03-13 LAB — C-REACTIVE PROTEIN: CRP: 5.8 mg/dL — ABNORMAL HIGH (ref <1.0)

## 2020-03-13 LAB — HEMOGLOBIN A1C
Hgb A1c MFr Bld: 7.9 % — ABNORMAL HIGH (ref 4.8–5.6)
Mean Plasma Glucose: 180.03 mg/dL

## 2020-03-13 SURGERY — EGD (ESOPHAGOGASTRODUODENOSCOPY)
Anesthesia: Moderate Sedation

## 2020-03-13 MED ORDER — INSULIN ASPART 100 UNIT/ML ~~LOC~~ SOLN
0.0000 [IU] | Freq: Four times a day (QID) | SUBCUTANEOUS | Status: DC
  Administered 2020-03-13 – 2020-03-14 (×5): 2 [IU] via SUBCUTANEOUS

## 2020-03-13 MED ORDER — POLYETHYLENE GLYCOL 3350 17 G PO PACK: 17.0000 g | PACK | Freq: Every day | ORAL | Status: DC | PRN

## 2020-03-13 MED ORDER — ONDANSETRON HCL 4 MG/2ML IJ SOLN: 4.0000 mg | Freq: Four times a day (QID) | INTRAMUSCULAR | Status: DC | PRN

## 2020-03-13 MED ORDER — PANTOPRAZOLE SODIUM 40 MG IV SOLR
INTRAVENOUS | Status: AC
  Filled 2020-03-13: qty 80

## 2020-03-13 MED ORDER — SODIUM CHLORIDE 0.9 % IV SOLN: 250.0000 mL | INTRAVENOUS | Status: DC | PRN

## 2020-03-13 MED ORDER — MEPERIDINE HCL 50 MG/ML IJ SOLN
INTRAMUSCULAR | Status: AC
  Filled 2020-03-13: qty 1

## 2020-03-13 MED ORDER — LIDOCAINE VISCOUS HCL 2 % MT SOLN
OROMUCOSAL | Status: DC | PRN
  Administered 2020-03-13: 5 mL via OROMUCOSAL

## 2020-03-13 MED ORDER — MIDAZOLAM HCL 5 MG/5ML IJ SOLN
INTRAMUSCULAR | Status: AC
  Filled 2020-03-13: qty 10

## 2020-03-13 MED ORDER — SODIUM CHLORIDE 0.9 % IV SOLN
80.0000 mg | Freq: Once | INTRAVENOUS | Status: AC
  Administered 2020-03-13: 80 mg via INTRAVENOUS
  Filled 2020-03-13: qty 80

## 2020-03-13 MED ORDER — ONDANSETRON HCL 4 MG PO TABS: 4.0000 mg | ORAL_TABLET | Freq: Four times a day (QID) | ORAL | Status: DC | PRN

## 2020-03-13 MED ORDER — ESCITALOPRAM OXALATE 10 MG PO TABS
10.0000 mg | ORAL_TABLET | Freq: Every day | ORAL | Status: DC
  Administered 2020-03-13 – 2020-03-14 (×2): 10 mg via ORAL
  Filled 2020-03-13 (×2): qty 1

## 2020-03-13 MED ORDER — PANTOPRAZOLE SODIUM 40 MG PO TBEC
40.0000 mg | DELAYED_RELEASE_TABLET | Freq: Every day | ORAL | Status: DC
  Administered 2020-03-14: 40 mg via ORAL
  Filled 2020-03-13 (×2): qty 1

## 2020-03-13 MED ORDER — PEG 3350-KCL-NA BICARB-NACL 420 G PO SOLR
2000.0000 mL | Freq: Once | ORAL | Status: AC
  Administered 2020-03-13: 2000 mL via ORAL

## 2020-03-13 MED ORDER — SODIUM CHLORIDE 0.9% FLUSH: 3.0000 mL | INTRAVENOUS | Status: DC | PRN

## 2020-03-13 MED ORDER — ONDANSETRON HCL 4 MG/2ML IJ SOLN
INTRAMUSCULAR | Status: AC
  Filled 2020-03-13: qty 2

## 2020-03-13 MED ORDER — MEPERIDINE HCL 100 MG/ML IJ SOLN
INTRAMUSCULAR | Status: DC | PRN
  Administered 2020-03-13: 25 mg

## 2020-03-13 MED ORDER — LACTATED RINGERS IV SOLN: INTRAVENOUS | Status: DC

## 2020-03-13 MED ORDER — BISACODYL 10 MG RE SUPP: 10.0000 mg | Freq: Every day | RECTAL | Status: DC | PRN

## 2020-03-13 MED ORDER — SODIUM CHLORIDE 0.9 % IV SOLN
2.0000 g | Freq: Three times a day (TID) | INTRAVENOUS | Status: DC
  Administered 2020-03-13 – 2020-03-14 (×4): 2 g via INTRAVENOUS
  Filled 2020-03-13 (×4): qty 2

## 2020-03-13 MED ORDER — LIDOCAINE VISCOUS HCL 2 % MT SOLN
OROMUCOSAL | Status: AC
  Filled 2020-03-13: qty 15

## 2020-03-13 MED ORDER — SODIUM CHLORIDE 0.9 % IV SOLN
8.0000 mg/h | INTRAVENOUS | Status: DC
  Administered 2020-03-13: 8 mg/h via INTRAVENOUS
  Filled 2020-03-13 (×8): qty 80

## 2020-03-13 MED ORDER — PEG 3350-KCL-NA BICARB-NACL 420 G PO SOLR
2000.0000 mL | Freq: Once | ORAL | Status: AC
  Administered 2020-03-14: 04:00:00 2000 mL via ORAL

## 2020-03-13 MED ORDER — PRAVASTATIN SODIUM 40 MG PO TABS
40.0000 mg | ORAL_TABLET | Freq: Every evening | ORAL | Status: DC
  Administered 2020-03-13: 40 mg via ORAL
  Filled 2020-03-13: qty 1

## 2020-03-13 MED ORDER — METOPROLOL TARTRATE 50 MG PO TABS
25.0000 mg | ORAL_TABLET | Freq: Two times a day (BID) | ORAL | Status: DC
  Administered 2020-03-13 – 2020-03-14 (×3): 25 mg via ORAL
  Filled 2020-03-13 (×3): qty 1

## 2020-03-13 MED ORDER — AMLODIPINE BESYLATE 5 MG PO TABS
10.0000 mg | ORAL_TABLET | Freq: Every day | ORAL | Status: DC
  Administered 2020-03-13 – 2020-03-14 (×2): 10 mg via ORAL
  Filled 2020-03-13 (×2): qty 2

## 2020-03-13 MED ORDER — BISACODYL 5 MG PO TBEC
10.0000 mg | DELAYED_RELEASE_TABLET | Freq: Once | ORAL | Status: AC
  Administered 2020-03-13: 10 mg via ORAL
  Filled 2020-03-13: qty 2

## 2020-03-13 MED ORDER — VITAMIN D 25 MCG (1000 UNIT) PO TABS
1000.0000 [IU] | ORAL_TABLET | Freq: Every day | ORAL | Status: DC
  Administered 2020-03-13 – 2020-03-14 (×2): 1000 [IU] via ORAL
  Filled 2020-03-13 (×2): qty 1

## 2020-03-13 MED ORDER — SODIUM CHLORIDE 0.9% FLUSH
3.0000 mL | Freq: Two times a day (BID) | INTRAVENOUS | Status: DC
  Administered 2020-03-13 – 2020-03-14 (×3): 3 mL via INTRAVENOUS

## 2020-03-13 MED ORDER — SODIUM CHLORIDE 0.9 % IV SOLN: INTRAVENOUS | Status: DC

## 2020-03-13 MED ORDER — MIDAZOLAM HCL 5 MG/5ML IJ SOLN
INTRAMUSCULAR | Status: DC | PRN
  Administered 2020-03-13: 2 mg via INTRAVENOUS

## 2020-03-13 MED ORDER — STERILE WATER FOR IRRIGATION IR SOLN
Status: DC | PRN
  Administered 2020-03-13: 2.5 mL

## 2020-03-13 MED ORDER — MORPHINE SULFATE (PF) 2 MG/ML IV SOLN
1.0000 mg | INTRAVENOUS | Status: DC | PRN
  Administered 2020-03-13 (×2): 1 mg via INTRAVENOUS
  Filled 2020-03-13 (×2): qty 1

## 2020-03-13 MED ORDER — ONDANSETRON HCL 4 MG/2ML IJ SOLN
INTRAMUSCULAR | Status: DC | PRN
  Administered 2020-03-13: 4 mg via INTRAVENOUS

## 2020-03-13 MED ORDER — MONTELUKAST SODIUM 10 MG PO TABS
10.0000 mg | ORAL_TABLET | Freq: Every day | ORAL | Status: DC
  Administered 2020-03-13: 10 mg via ORAL
  Filled 2020-03-13: qty 1

## 2020-03-13 NOTE — Progress Notes (Signed)
Patient Demographics:    Omar Todd, is a 69 y.o. male, DOB - [birth year], KM:[id number]  Admit date - 03/12/2020   Admitting Physician Lynetta Mare, MD  Outpatient Primary MD for the patient is [name], DO  LOS - 0  Chief Complaint  Patient presents with   Anemia        Subjective:    Omar Todd today has no fevers, no emesis,  No chest pain,   Sister at bedside , questions at bedside -tolerated EGD well---   Assessment  & Plan :    Principal Problem:   GI bleed Active Problems:   Diabetic neuropathy, type II diabetes mellitus ([place])   Bilateral carotid artery disease ([place])   Hyperlipidemia associated with type 2 diabetes mellitus ([place])   Hypertension associated with diabetes ([place])   Diabetes mellitus type 2 controlled   BPH (benign prostatic hyperplasia)   AAA (abdominal aortic aneurysm) without rupture ([place])   Anemia   Brief Summary:- -69 y.o. male with medical history significant of COPD, HLD, PAD/AAA,  DM type 2, chronic iron deficiency anemia anemia -presenting with with Hgb 6.5, He reports having a wound and gangrene over the right toe over the past 5 months which has gotten worse and he has been taking ibuprofen, meloxicam, diclofenac, aspirin powder (goody/BC) --Patient with significant progressive  anemia / Hemoccult positive.  -He was scheduled for aortogram and bilateral lower extremity angiogram with possible stenting by vascular surgery and discovered to be anemic  A/p 1)Acute GI bleed--- patient with NSAID use, EGD unrevealing, plan for colonoscopy on 03/14/2020  2)Acute on chronic iron deficiency symptomatic anemia--- hemoglobin up to 8.4 from 6.5 after transfusion of 2 units of blood--- -- 3)Right foot Gangrene/Cellulitis-- Patient has multiple gangrenous lesions over right foot with a lesion over the right heel, right great toe, third fourth and fifth  toe.  No significant purulent drainage noted.  Some redness is noted on the wounds. -Vascular surgeon wants to await GI work-up prior to any intervention -IV cefepime as ordered  4)PAD/AAA/nonhealing right foot gangrenous wounds--- CT angiogram shows a 4.7 cm partially thrombosed abdominal aortic aneurysm. ----He was scheduled for aortogram and bilateral lower extremity angiogram with possible stenting by vascular surgery and discovered to be anemic -Continue pravastatin, restart antiplatelet agent after GI work-up is completed  5)DM2-A1c 7.9, reflecting uncontrolled DM PTA  -use Novolog/Humalog Sliding scale insulin with Accu-Cheks/Fingersticks as ordered  6) COPD and ongoing tobacco abuse--- not interested in smoking cessation, no COPD exacerbation at this time -Had transient hypoxia on admission suspect due to underlying COPD   7) mild to moderate aortic stenosis--echo from 03/13/2020 noted, EF is preserved at 50 to 55%   Disposition/Need for in-Hospital Stay- patient unable to be discharged at this time due to --- right foot gangrene requiring IV antibiotics and presumed GI blood loss requiring transfusion and further endoluminal evaluation* -Patient From: home D/C Place: home Barriers: Not Clinically Stable-   Code Status : full  Procedure:- EGD on 03/13/20  Family Communication:   NA (patient is alert, awake and coherent)  Consults  :  Gi  DVT Prophylaxis  :    SCDs --GI bleed  Lab Results  Component Value Date   PLT 387  03/13/2020    Inpatient Medications  Scheduled Meds:  amLODipine  10 mg Oral Daily   bisacodyl  10 mg Oral Once   cholecalciferol  1,000 Units Oral Daily   escitalopram  10 mg Oral Daily   insulin aspart  0-9 Units Subcutaneous Q6H   lidocaine       meperidine       metoprolol tartrate  25 mg Oral BID   midazolam       montelukast  10 mg Oral QHS   ondansetron       pantoprazole  40 mg Oral Daily   polyethylene glycol-electrolytes   2,000 mL Oral Once   [START ON 03/14/2020] polyethylene glycol-electrolytes  2,000 mL Oral Once   pravastatin  40 mg Oral QPM   sodium chloride flush  3 mL Intravenous Q12H   Continuous Infusions:  sodium chloride     ceFEPime (MAXIPIME) IV 2 g (03/13/20 1330)   lactated ringers 75 mL/hr at 03/13/20 0426   PRN Meds:.sodium chloride, bisacodyl, morphine injection, ondansetron **OR** ondansetron (ZOFRAN) IV, polyethylene glycol, sodium chloride flush    Anti-infectives (From admission, onward)   Start     Dose/Rate Route Frequency Ordered Stop   03/13/20 0600  ceFEPIme (MAXIPIME) 2 g in sodium chloride 0.9 % 100 mL IVPB     2 g 200 mL/hr over 30 Minutes Intravenous Every 8 hours 03/13/20 0515          Objective:   Vitals:   03/13/20 1135 03/13/20 1140 03/13/20 1145 03/13/20 1200  BP: (!) 113/57 110/62 (!) 107/51 135/74  Pulse: 76 71 69 77  Resp: (!) 24 (!) 27 (!) 22 20  Temp:    98.5 F (36.9 C)  TempSrc:    Oral  SpO2: (!) 88% (!) 89% 90% 97%  Weight:      Height:        Wt Readings from Last 3 Encounters:  03/13/20 95.8 kg  03/12/20 95.5 kg  02/28/20 98.4 kg     Intake/Output Summary (Last 24 hours) at 03/13/2020 1427 Last data filed at 03/13/2020 1144 Gross per 24 hour  Intake 1108.41 ml  Output --  Net 1108.41 ml     Physical Exam  Gen:- Awake Alert,  In no apparent distress  HEENT:- [place].AT, No sclera icterus Neck-Supple Neck,No JVD,.  Lungs-  CTAB , fair symmetrical air movement CV- S1, S2 normal, regular  Abd-  +ve B.Sounds, Abd Soft, No tenderness,    Extremity/Skin:-Right foot gangrene--- erythema ,warmth Psych-affect is appropriate, oriented x3 Neuro-no new focal deficits, no tremors   Data Review:   Micro Results Recent Results (from the past 240 hour(s))  Respiratory Panel by RT PCR (Flu A&B, Covid) - Nasopharyngeal Swab     Status: None   Collection Time: 03/12/20  8:32 PM   Specimen: Nasopharyngeal Swab  Result Value Ref Range  Status   SARS Coronavirus 2 by RT PCR NEGATIVE NEGATIVE Final    Comment: (NOTE) SARS-CoV-2 target nucleic acids are NOT DETECTED. The SARS-CoV-2 RNA is generally detectable in upper respiratoy specimens during the acute phase of infection. The lowest concentration of SARS-CoV-2 viral copies this assay can detect is 131 copies/mL. A negative result does not preclude SARS-Cov-2 infection and should not be used as the sole basis for treatment or other patient management decisions. A negative result may occur with  improper specimen collection/handling, submission of specimen other than nasopharyngeal swab, presence of viral mutation(s) within the areas targeted by this assay, and  inadequate number of viral copies (<131 copies/mL). A negative result must be combined with clinical observations, patient history, and epidemiological information. The expected result is Negative. Fact Sheet for Patients:  PinkCheek.be Fact Sheet for Healthcare Providers:  GravelBags.it This test is not yet ap proved or cleared by the [place] FDA and  has been authorized for detection and/or diagnosis of SARS-CoV-2 by FDA under an Emergency Use Authorization (EUA). This EUA will remain  in effect (meaning this test can be used) for the duration of the COVID-19 declaration under Section 564(b)(1) of the Act, 21 U.S.C. section 360bbb-3(b)(1), unless the authorization is terminated or revoked sooner.    Influenza A by PCR NEGATIVE NEGATIVE Final   Influenza B by PCR NEGATIVE NEGATIVE Final    Comment: (NOTE) The Xpert Xpress SARS-CoV-2/FLU/RSV assay is intended as an aid in  the diagnosis of influenza from Nasopharyngeal swab specimens and  should not be used as a sole basis for treatment. Nasal washings and  aspirates are unacceptable for Xpert Xpress SARS-CoV-2/FLU/RSV  testing. Fact Sheet for  Patients: PinkCheek.be Fact Sheet for Healthcare Providers: GravelBags.it This test is not yet approved or cleared by the [place] FDA and  has been authorized for detection and/or diagnosis of SARS-CoV-2 by  FDA under an Emergency Use Authorization (EUA). This EUA will remain  in effect (meaning this test can be used) for the duration of the  Covid-19 declaration under Section 564(b)(1) of the Act, 21  U.S.C. section 360bbb-3(b)(1), unless the authorization is  terminated or revoked. Performed at [facility], [address], [place], [place] [address]     Radiology Reports CT ANGIO Delrae Sawyers & OR WO CONTRAST  Result Date: 03/13/2020 CLINICAL DATA:  69 year old male with claudication. EXAM: CT ANGIOGRAPHY OF ABDOMINAL AORTA WITH ILIOFEMORAL RUNOFF TECHNIQUE: Multidetector CT imaging of the abdomen, pelvis and lower extremities was performed using the standard protocol during bolus administration of intravenous contrast. Multiplanar CT image reconstructions and MIPs were obtained to evaluate the vascular anatomy. CONTRAST:  144mL OMNIPAQUE IOHEXOL 350 MG/ML SOLN COMPARISON:  None. FINDINGS: Evaluation of this exam is limited due to respiratory motion artifact. VASCULAR Aorta: There is advanced atherosclerotic calcification of the abdominal aorta. There is a 4.7 cm fusiform infrarenal abdominal aortic aneurysm measuring approximately 10 cm in craniocaudal length and extends to the level of aortic bifurcation. There is thrombosis of the majority of the lumen of the aneurysm in the periphery. Evaluation of the aneurysm however is limited as precontrast and delayed images are not performed. No periaortic inflammation, fluid collection, or hematoma. Celiac: There is atherosclerotic calcification of the origin of the celiac axis. The celiac artery and its major branches appear patent. SMA: There is atherosclerotic calcification of the origin  of the SMA. The SMA remains patent. Renals: Evaluation of the renal arteries is somewhat limited due to suboptimal opacification and respiratory motion. There is atherosclerotic calcification of the origins of the renal arteries. The renal arteries remain patent. IMA: The IMA is not well visualized. RIGHT Lower Extremity Inflow: There is narrowing of the proximal right common iliac artery due to advanced atherosclerotic disease. There is advanced atherosclerotic calcification of the iliac arteries. Right external iliac artery remains patent. There is diminished flow in the right internal iliac artery. Outflow: There is advanced atherosclerotic calcification of the right common femoral artery, and superficial femoral artery. There is associated luminal narrowing with decreased flow. Severe atherosclerotic calcification of the distal aspect of the superficial femoral artery with decreased flow.  The right popliteal artery is patent but with decreased flow. Runoff: There is atherosclerotic calcification of the proximal arteries of the calf with diminished flow. There is flow within the anterior and posterior tibial arteries to the level of the ankle although with diminished flow. There is flow within the fibular artery. Flow is noted within the visualized proximal portion of the right dorsalis pedis and plantar arteries, although diminished. LEFT Lower Extremity Inflow: There is severe atherosclerotic calcification of the proximal portion of the left common iliac artery. There is advanced atherosclerotic calcification of the iliac arteries on the left. The left external iliac artery remains patent. There is diminished flow in the left internal iliac artery. Outflow: There is advanced atherosclerotic disease in the left common femoral, and superficial femoral artery. There is associated luminal narrowing with diminished flow within these vessels. The deep femoral artery is patent. The popliteal artery is patent although  with diminished flow due to advanced atherosclerotic disease and luminal narrowing. Runoff: There is atherosclerotic calcification of the trifurcation of the popliteal artery and proximal portion of the calf vessels. The anterior and posterior tibial arteries as well as the fibular artery are patent to the level of the ankle. The dorsalis pedis and plantar arteries are patent as visualized. Veins: No obvious venous abnormality within the limitations of this arterial phase study. Review of the MIP images confirms the above findings. NON-VASCULAR Lower chest: There are bibasilar dependent atelectasis. There is mild cardiomegaly. Coronary vascular calcifications noted. No intra-abdominal free air or free fluid. Hepatobiliary: The liver is unremarkable. No intrahepatic biliary ductal dilatation. The gallbladder is unremarkable. Pancreas: Unremarkable. No pancreatic ductal dilatation or surrounding inflammatory changes. Spleen: Normal in size without focal abnormality. Adrenals/Urinary Tract: The adrenal glands are unremarkable. There is no hydronephrosis on either side. Small bilateral renal hypodense lesions are too small to characterize. The visualized ureters and urinary bladder appear unremarkable. Stomach/Bowel: There is no bowel obstruction or active inflammation. The appendix is normal. Lymphatic: No adenopathy. Reproductive: Enlarged prostate gland measuring 5.2 cm in transverse axial diameter. Other: None Musculoskeletal: Degenerative changes of the spine. No acute osseous pathology. There is diffuse subcutaneous edema of the distal lower extremity and feet, right greater than left. IMPRESSION: 1. A 4.7 cm partially thrombosed fusiform infrarenal abdominal aortic aneurysm. Evaluation of the aneurysm is limited as precontrast and delayed images are not performed. No periaortic inflammation or fluid. Clinical correlation and vascular surgery consult is advised. 2. Advanced atherosclerotic calcification of the  aorta, iliac arteries, and arteries of the bilateral lower extremities. There is associated luminal narrowing of the vessels of the lower extremities with diminished flow. There is greater involvement of the right lower extremity vessels with more significant diminished inflow in the right lower extremity compared to the left. Flow noted to the level of the ankles bilaterally although significantly diminished on the right. 3. Diffuse subcutaneous edema of the distal calf and feet, right greater than left. 4. No bowel obstruction. Normal appendix. 5. Enlarged prostate gland. Electronically Signed   By: Anner Crete M.D.   On: 03/13/2020 00:15   DG Foot Complete Right  Result Date: 02/13/2020 Please see detailed radiograph report in office note.  ECHOCARDIOGRAM COMPLETE  Result Date: 03/13/2020    ECHOCARDIOGRAM REPORT   Patient Name:   Omar Todd Date of Exam: 03/13/2020 Medical Rec #:  [id number]      Height:       74.0 in Accession #:    [id number]  Weight:       211.2 lb Date of Birth:  [birth year]     BSA:          2.224 m Patient Age:    89 years       BP:           136/78 mmHg Patient Gender: M              HR:           77 bpm. Exam Location:  [name] Procedure: 2D Echo Indications:    Murmur 785.2 / R01.1  History:        Patient has prior history of Echocardiogram examinations, most                 recent 02/06/2014. COPD; Risk Factors:Hypertension, Diabetes,                 Dyslipidemia and Current Smoker.  Sonographer:    Leavy Cella RDCS (AE) Referring Phys: [place]  1. Left ventricular ejection fraction, by estimation, is 50 to 55%. The left ventricle has low normal function. Left ventricular endocardial border not optimally defined to evaluate regional wall motion. There is mild left ventricular hypertrophy. Left ventricular diastolic parameters are indeterminate.  2. Right ventricular systolic function is normal. The right ventricular size is normal.   3. The mitral valve is normal in structure. Trivial mitral valve regurgitation. No evidence of mitral stenosis.  4. The aortic valve has an indeterminant number of cusps. Aortic valve regurgitation is mild. Mild to moderate aortic valve stenosis. Aortic valve mean gradient measures 19.1 mmHg. Aortic valve peak gradient measures 35.2 mmHg. Aortic valve area, by VTI  measures 1.18 cm FINDINGS  Left Ventricle: Left ventricular ejection fraction, by estimation, is 50 to 55%. The left ventricle has low normal function. Left ventricular endocardial border not optimally defined to evaluate regional wall motion. The left ventricular internal cavity  size was normal in size. There is mild left ventricular hypertrophy. Left ventricular diastolic parameters are indeterminate. Right Ventricle: The right ventricular size is normal. No increase in right ventricular wall thickness. Right ventricular systolic function is normal. Left Atrium: Left atrial size was normal in size. Right Atrium: Right atrial size was normal in size. Pericardium: There is no evidence of pericardial effusion. Mitral Valve: The mitral valve is normal in structure. Trivial mitral valve regurgitation. No evidence of mitral valve stenosis. Tricuspid Valve: The tricuspid valve is normal in structure. Tricuspid valve regurgitation is not demonstrated. No evidence of tricuspid stenosis. Aortic Valve: The aortic valve has an indeterminant number of cusps. . There is severe thickening and severe calcifcation of the aortic valve. Aortic valve regurgitation is mild. Mild to moderate aortic stenosis is present. Severe aortic valve annular calcification. There is severe thickening of the aortic valve. There is severe calcifcation of the aortic valve. Aortic valve mean gradient measures 19.1 mmHg. Aortic valve peak gradient measures 35.2 mmHg. Aortic valve area, by VTI measures 1.18 cm. Pulmonic Valve: The pulmonic valve was not well visualized. Pulmonic valve  regurgitation is not visualized. No evidence of pulmonic stenosis. Aorta: The aortic root is normal in size and structure. Pulmonary Artery: Indeterminant PASP, inadequate TR jet. Venous: The inferior vena cava was not well visualized. IAS/Shunts: No atrial level shunt detected by color flow Doppler.  LEFT VENTRICLE PLAX 2D LVIDd:         5.09 cm  Diastology LVIDs:  4.12 cm  LV e' lateral:   7.94 cm/s LV PW:         1.10 cm  LV E/e' lateral: 14.1 LV IVS:        1.07 cm  LV e' medial:    8.59 cm/s LVOT diam:     2.30 cm  LV E/e' medial:  13.0 LV SV:         79 LV SV Index:   36 LVOT Area:     4.15 cm  RIGHT VENTRICLE RV S prime:     13.70 cm/s TAPSE (M-mode): 2.6 cm LEFT ATRIUM           Index LA diam:      3.90 cm 1.75 cm/m LA Vol (A4C): 59.8 ml 26.88 ml/m  AORTIC VALVE AV Area (Vmax):    1.17 cm AV Area (Vmean):   1.20 cm AV Area (VTI):     1.18 cm AV Vmax:           296.74 cm/s AV Vmean:          203.330 cm/s AV VTI:            0.669 m AV Peak Grad:      35.2 mmHg AV Mean Grad:      19.1 mmHg LVOT Vmax:         83.27 cm/s LVOT Vmean:        58.596 cm/s LVOT VTI:          0.190 m LVOT/AV VTI ratio: 0.28  AORTA Ao Root diam: 3.20 cm MITRAL VALVE MV Area (PHT): 4.46 cm     SHUNTS MV Decel Time: 170 msec     Systemic VTI:  0.19 m MV E velocity: 112.00 cm/s  Systemic Diam: 2.30 cm MV A velocity: 54.80 cm/s MV E/A ratio:  2.04 Carlyle Dolly MD Electronically signed by Carlyle Dolly MD Signature Date/Time: 03/13/2020/12:57:29 PM    Final    VAS [place] LE ART SEG MULTI (Segm&LE Reynauds)  Result Date: 02/29/2020 LOWER EXTREMITY DOPPLER STUDY Indications: Rest pain, ulceration, peripheral artery disease, and ulcer of              right great toe and right heel with fat layer exposed. High Risk Factors: Hypertension, hyperlipidemia, Diabetes, current smoker. Other Factors: SEE BILATERAL ARTERIAL LEG DUPLEX REPORT.  Vascular Interventions: None. Limitations: Today's exam was limited due to patient intolerant  to cuff              pressure, an open wound and involuntary patient movement. Comparison Study: None Performing Technologist: Salvadore Dom RVT  Examination Guidelines: A complete evaluation includes at minimum, Doppler waveform signals and systolic blood pressure reading at the level of bilateral brachial, anterior tibial, and posterior tibial arteries, when vessel segments are accessible. Bilateral testing is considered an integral part of a complete examination. Photoelectric Plethysmograph (PPG) waveforms and toe systolic pressure readings are included as required and additional duplex testing as needed. Limited examinations for reoccurring indications may be performed as noted.  ABI Findings: +---------+------------------+-----+----------+--------+  Right     Rt Pressure (mmHg) Index Waveform   Comment   +---------+------------------+-----+----------+--------+  Brachial  163                                           +---------+------------------+-----+----------+--------+  CFA  monophasic           +---------+------------------+-----+----------+--------+  Popliteal                          monophasic           +---------+------------------+-----+----------+--------+  ATA                                           no flow   +---------+------------------+-----+----------+--------+  PTA                                           no flow   +---------+------------------+-----+----------+--------+  PERO                                          no flow   +---------+------------------+-----+----------+--------+  Great Toe                                     no flow   +---------+------------------+-----+----------+--------+ +---------+------------------+-----+----------+----------------+  Left      Lt Pressure (mmHg) Index Waveform   Comment           +---------+------------------+-----+----------+----------------+  Brachial  156                                                    +---------+------------------+-----+----------+----------------+  CFA                                monophasic                   +---------+------------------+-----+----------+----------------+  Popliteal                          monophasic                   +---------+------------------+-----+----------+----------------+  ATA       75                 0.46  monophasic                   +---------+------------------+-----+----------+----------------+  PTA       81                 0.50  monophasic                   +---------+------------------+-----+----------+----------------+  PERO      97                 0.60  monophasic                   +---------+------------------+-----+----------+----------------+  Great Toe  unable to obtain  +---------+------------------+-----+----------+----------------+ +-------+-----------+-----------+------------+------------+  ABI/TBI Today's ABI Today's TBI Previous ABI Previous TBI  +-------+-----------+-----------+------------+------------+  Right   0           0                                      +-------+-----------+-----------+------------+------------+  Left    .60         0                                      +-------+-----------+-----------+------------+------------+ Technically very difficult secondary to rest pain and involuntary movement.  Summary: Right: Resting right ankle-brachial index indicates critical limb ischemia. The right toe-brachial index is abnormal. Left: Resting left ankle-brachial index indicates moderate left lower extremity arterial disease. The left toe-brachial index is abnormal.  *See table(s) above for measurements and observations.  Electronically signed by Carlyle Dolly MD on 02/29/2020 at 8:27:10 AM.    Final    VAS US CAROTID  Result Date: 03/09/2020 Carotid Arterial Duplex Study Indications:       Carotid artery disease follow-up. Patient denies any                    cerebrovascular symptoms. Risk  Factors:      Hypertension, hyperlipidemia, Diabetes, current smoker, PAD. Comparison Study:  Previous carotid duplex performed at [facility] in                    11/17 showed RICA velocities of 123XX123 cm/sec and LICA                    velocities of 239/61 cm/sec. Performing Technologist: Mariane Masters RVT  Examination Guidelines: A complete evaluation includes B-mode imaging, spectral Doppler, color Doppler, and power Doppler as needed of all accessible portions of each vessel. Bilateral testing is considered an integral part of a complete examination. Limited examinations for reoccurring indications may be performed as noted.  Right Carotid Findings: +----------+--------+--------+--------+------------------------+--------+             PSV cm/s EDV cm/s Stenosis Plaque Description       Comments  +----------+--------+--------+--------+------------------------+--------+  CCA Prox   125      9                                                    +----------+--------+--------+--------+------------------------+--------+  CCA Mid    75       13       <50%     diffuse and heterogenous           +----------+--------+--------+--------+------------------------+--------+  CCA Distal 57       11                                                   +----------+--------+--------+--------+------------------------+--------+  ICA Prox   110      21                                                   +----------+--------+--------+--------+------------------------+--------+  ICA Mid    303      69       60-79%   focal and heterogenous             +----------+--------+--------+--------+------------------------+--------+  ICA Distal 81       22                                                   +----------+--------+--------+--------+------------------------+--------+  ECA        88       6                                                    +----------+--------+--------+--------+------------------------+--------+  +----------+--------+-------+----------------+-------------------+             PSV cm/s EDV cms Describe         Arm Pressure (mmHG)  +----------+--------+-------+----------------+-------------------+  Subclavian 178              Multiphasic, WNL 142                  +----------+--------+-------+----------------+-------------------+ +---------+--------+---+--------+--+---------+  Vertebral PSV cm/s 102 EDV cm/s 19 Antegrade  +---------+--------+---+--------+--+---------+  Left Carotid Findings: +----------+--------+--------+--------+----------------------+--------+             PSV cm/s EDV cm/s Stenosis Plaque Description     Comments  +----------+--------+--------+--------+----------------------+--------+  CCA Prox   105      23                                                 +----------+--------+--------+--------+----------------------+--------+  CCA Mid    138      18       <50%     focal and heterogenous           +----------+--------+--------+--------+----------------------+--------+  CCA Distal 128      25                heterogenous                     +----------+--------+--------+--------+----------------------+--------+  ICA Prox   312      78       60-79%   heterogenous                     +----------+--------+--------+--------+----------------------+--------+  ICA Mid    278      51                                                 +----------+--------+--------+--------+----------------------+--------+  ICA Distal 109      32                                                 +----------+--------+--------+--------+----------------------+--------+  ECA        96  7                                                  +----------+--------+--------+--------+----------------------+--------+ +----------+--------+--------+----------------+-------------------+             PSV cm/s EDV cm/s Describe         Arm Pressure (mmHG)  +----------+--------+--------+----------------+-------------------+  Subclavian 188                Multiphasic, WNL 138                  +----------+--------+--------+----------------+-------------------+ +---------+--------+--+--------+--+---------+  Vertebral PSV cm/s 61 EDV cm/s 25 Antegrade  +---------+--------+--+--------+--+---------+   Summary: Right Carotid: 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 Non-hemodynamically significant plaque <50% noted in                the CCA. Left Carotid: Velocities in the left ICA are consistent with a 60-79% stenosis.               Non-hemodynamically significant plaque <50% noted in the CCA. Vertebrals:  Bilateral vertebral arteries demonstrate antegrade flow. Subclavians: Normal flow hemodynamics were seen in bilateral subclavian              arteries. *See table(s) above for measurements and observations. Suggest follow up study in 12 months. Electronically signed by Kathlyn Sacramento MD on 03/09/2020 at 8:28:47 AM.    Final    VAS [place] LOWER EXTREMITY ARTERIAL DUPLEX  Result Date: 02/29/2020 LOWER EXTREMITY ARTERIAL DUPLEX STUDY Indications: Claudication, rest pain, ulceration, and Right foot ulcers on 1st              toe and heel. High Risk Factors: Hypertension, hyperlipidemia, Diabetes, current smoker. Other Factors: COPD                 SEE ABI REPORT.  Current ABI: Right unobtainable Left .60 Limitations: Non fasting afternoon patient. Patient unable to keep still due to              involuntary leg movements, legs kept cramping. Comparison Study: None Performing Technologist: Alecia Mackin RVT, RDCS (AE), RDMS  Examination Guidelines: A complete evaluation includes B-mode imaging, spectral Doppler, color Doppler, and power Doppler as needed of all accessible portions of each vessel. Bilateral testing is considered an integral part of a complete examination. Limited examinations for reoccurring indications may be performed as noted. Aorta: +------+-------+----------+----------+----------+--------+-----+         AP  (cm) Trans (cm) PSV (cm/s) Waveform   Thrombus Shape  +------+-------+----------+----------+----------+--------+-----+  Distal 4.90    4.80       30         monophasic                 +------+-------+----------+----------+----------+--------+-----+   +-----------+--------+-----+---------------+----------+------------------------+  RIGHT       PSV cm/s Ratio Stenosis        Waveform   Comments                  +-----------+--------+-----+---------------+----------+------------------------+  CIA Prox  not seen due to                                                                  overlying bowel gas       +-----------+--------+-----+---------------+----------+------------------------+  EIA Prox                                              not seen due to                                                                  overlying bowel gas       +-----------+--------+-----+---------------+----------+------------------------+  EIA Mid     177                            monophasic                           +-----------+--------+-----+---------------+----------+------------------------+  CFA Prox    147                            monophasic                           +-----------+--------+-----+---------------+----------+------------------------+  CFA Distal  134                            monophasic                           +-----------+--------+-----+---------------+----------+------------------------+  DFA         253                            monophasic                           +-----------+--------+-----+---------------+----------+------------------------+  SFA Prox    72                             monophasic                           +-----------+--------+-----+---------------+----------+------------------------+  SFA Mid     288            50-74% stenosis monophasic                            +-----------+--------+-----+---------------+----------+------------------------+  SFA Distal  32                                        ?  occlusion / severe                                                             shadowing                 +-----------+--------+-----+---------------+----------+------------------------+  POP Prox                   occluded                                             +-----------+--------+-----+---------------+----------+------------------------+  POP Mid     38                             monophasic                           +-----------+--------+-----+---------------+----------+------------------------+  POP Distal  59                             monophasic                           +-----------+--------+-----+---------------+----------+------------------------+  TP Trunk    60                             monophasic                           +-----------+--------+-----+---------------+----------+------------------------+  ATA Distal  10                             monophasic                           +-----------+--------+-----+---------------+----------+------------------------+  PTA Distal  22                             monophasic                           +-----------+--------+-----+---------------+----------+------------------------+  PERO Distal 5                              monophasic                           +-----------+--------+-----+---------------+----------+------------------------+ A focal velocity elevation of 288 cm/s was obtained at MID SFA with a VR of 4.0. Findings are characteristic of 50-74% stenosis. Unobtainable proximal and mid calf arteries along with pedal artery due to involuntary patient movement.  +-----------+--------+-----+---------------+----------+------------------------+  LEFT        PSV cm/s Ratio Stenosis        Waveform   Comments                  +-----------+--------+-----+---------------+----------+------------------------+  CIA Prox     356                            monophasic > 50 % stenosis           +-----------+--------+-----+---------------+----------+------------------------+  CIA Distal                                 monophasic                           +-----------+--------+-----+---------------+----------+------------------------+  EIA Prox    157                            monophasic                           +-----------+--------+-----+---------------+----------+------------------------+  EIA Mid     205                            monophasic > 50 % stenosis           +-----------+--------+-----+---------------+----------+------------------------+  EIA Distal  118                            monophasic                           +-----------+--------+-----+---------------+----------+------------------------+  CFA Prox    393            50-74% stenosis monophasic                           +-----------+--------+-----+---------------+----------+------------------------+  CFA Distal  442            75-99% stenosis monophasic                           +-----------+--------+-----+---------------+----------+------------------------+  DFA         317            50-74% stenosis monophasic                           +-----------+--------+-----+---------------+----------+------------------------+  SFA Prox    480            75-99% stenosis monophasic                           +-----------+--------+-----+---------------+----------+------------------------+  SFA Mid     100                            monophasic                           +-----------+--------+-----+---------------+----------+------------------------+  SFA Distal  100                            monophasic                           +-----------+--------+-----+---------------+----------+------------------------+  POP Prox    66                             monophasic                           +-----------+--------+-----+---------------+----------+------------------------+  POP Mid     79                              monophasic                           +-----------+--------+-----+---------------+----------+------------------------+  POP Distal  61                             monophasic                           +-----------+--------+-----+---------------+----------+------------------------+  TP Trunk    152            50-74% stenosis monophasic                           +-----------+--------+-----+---------------+----------+------------------------+  ATA Distal                                            unobtainable due to                                                              patient movement          +-----------+--------+-----+---------------+----------+------------------------+  PTA Distal  41                             monophasic                           +-----------+--------+-----+---------------+----------+------------------------+  PERO Distal 26                             monophasic                           +-----------+--------+-----+---------------+----------+------------------------+ A focal velocity elevation of 480 cm/s was obtained at CFA- PRX SFA with a VR of 4.1. Findings are characteristic of 75-99% stenosis. A 2nd focal velocity elevation was visualized, measuring 152 cm/s at TPT with a VR of 2.49. Findings are characteristic of 50-74% stenosis. Unobtainable proximal and mid calf arteries along with pedal artery due to involuntary patient movement.  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 Heavy calcified vessels throughout. 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 Heavy calcified aortic walls. Limited exam due to non-fasting patient, unable to visualize right CIA and right proximal EIA. Left: 75-99% stenosis noted in the common femoral artery. 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  Heavy calcified vessels with irregular plaque throughout.  See table(s) above for measurements and observations. Patient seeing Dr. Gwenlyn Found  today 02/28/2020 3:15 pm. Vascular consult recommended. Electronically signed by Carlyle Dolly MD on 02/29/2020 at 8:58:49 AM.    Final      CBC Recent Labs  Lab 03/12/20 1708 03/13/20 0519  WBC 9.4 8.3  HGB 6.5* 8.4*  HCT 23.1* 28.7*  PLT 413* 387  MCV 73.3* 75.1*  MCH 20.6* 22.0*  MCHC 28.1* 29.3*  RDW 18.5* 18.9*  LYMPHSABS 1.3  --   MONOABS 0.7  --   EOSABS 0.2  --   BASOSABS 0.0  --     Chemistries  Recent Labs  Lab 03/12/20 2001 03/13/20 0519  NA 130* 134*  K 4.5 4.4  CL 98 98  CO2 23 25  GLUCOSE 282* 187*  BUN 20 15  CREATININE 1.06 0.85  CALCIUM 8.7* 9.0   ------------------------------------------------------------------------------------------------------------------ No results for input(s): CHOL, HDL, LDLCALC, TRIG, CHOLHDL, LDLDIRECT in the last 72 hours.  Lab Results  Component Value Date   HGBA1C 7.9 (H) 03/13/2020   ------------------------------------------------------------------------------------------------------------------ No results for input(s): TSH, T4TOTAL, T3FREE, THYROIDAB in the last 72 hours.  Invalid input(s): FREET3 ------------------------------------------------------------------------------------------------------------------ Recent Labs    03/12/20 1707  FERRITIN 9*  TIBC 438  IRON 14*    Coagulation profile No results for input(s): INR, PROTIME in the last 168 hours.  No results for input(s): DDIMER in the last 72 hours.  Cardiac Enzymes No results for input(s): CKMB, TROPONINI, MYOGLOBIN in the last 168 hours.  Invalid input(s): CK ------------------------------------------------------------------------------------------------------------------ No results found for: BNP   Roxan Hockey M.D on 03/13/2020 at 2:27 PM  Go to [email or web address] - for contact info  [facility] - Office  [phone]

## 2020-03-13 NOTE — Interval H&P Note (Signed)
History and Physical Interval Note:  03/13/2020 11:08 AM  Omar Todd  has presented today for surgery, with the diagnosis of Profound anemia, upper gastrointestinal bleeding.  The various methods of treatment have been discussed with the patient and family. After consideration of risks, benefits and other options for treatment, the patient has consented to  Procedure(s): ESOPHAGOGASTRODUODENOSCOPY (EGD) (N/A) as a surgical intervention.  The patient's history has been reviewed, patient examined, no change in status, stable for surgery.  I have reviewed the patient's chart and labs.  Questions were answered to the patient's satisfaction.     [name]  Patient seen and examined in the endoscopy unit.  Agree with the need for EGD given melena and drop in hemoglobin.  Hemoglobin 8.4 this morning after 2 unit transfusion.  Hemodynamically stable. Patient denies dysphagia.  The risks, benefits, limitations, alternatives and imponderables have been reviewed with the patient. Potential for esophageal dilation, biopsy, etc. have also been reviewed.  Questions have been answered. All parties agreeable.   Further recommendations to follow.

## 2020-03-13 NOTE — Progress Notes (Signed)
*  PRELIMINARY RESULTS* Echocardiogram 2D Echocardiogram has been performed.  Omar Todd 03/13/2020, 10:41 AM

## 2020-03-13 NOTE — ED Notes (Signed)
Pt desats while asleep. Pt placed on 2l [place].

## 2020-03-13 NOTE — Progress Notes (Signed)
Discussed with Dr. Gala Romney. EGD unremarkable. Plan for colonoscopy tomorrow morning at 9:00 am.  Thank you for allowing [place] to participate in the care of Omar Neighbours, DNP, AGNP-C Adult & Gerontological Nurse Practitioner [facility]

## 2020-03-13 NOTE — Progress Notes (Signed)
Subjective: Feels better after transfusions. Foot pain doing ok this morning. Denies abdominal pain, N/V. No overt GI complaints.  Objective: Vital signs in last 24 hours: Temp:  [97.1 F (36.2 C)-98.9 F (37.2 C)] 98.3 F (36.8 C) (04/23 0401) Pulse Rate:  [82-100] 97 (04/23 0401) Resp:  [16-22] 22 (04/23 0401) BP: (116-154)/(54-92) 154/70 (04/23 0401) SpO2:  [86 %-99 %] 95 % (04/23 0401) FiO2 (%):  [0 %] 0 % (04/23 0400) Weight:  [94.3 kg-95.8 kg] 95.8 kg (04/23 0431) Last BM Date: 03/12/20 General:   Alert and oriented, pleasant Head:  Normocephalic and atraumatic. Eyes:  No icterus, sclera clear. Conjuctiva pink.  Heart:  S1, S2 present. Systolic 4/6 blowing murmur noted.  Lungs: Clear to auscultation bilaterally, without wheezing, rales, or rhonchi.  Abdomen:  Bowel sounds present, soft, non-tender, non-distended. No HSM or hernias noted. No rebound or guarding. No masses appreciated  Msk:  Symmetrical without gross deformities. Extremities:  Without clubbing or edema. Neurologic:  Alert and  oriented x4;  grossly normal neurologically. Skin:  Warm and dry, intact. Foot wrap in place and not removed right foot.  Psych:  Alert and cooperative. Normal mood and affect.  Intake/Output from previous day: 04/22 0701 - 04/23 0700 In: 908.4 [I.V.:148.8; Blood:599; IV Piggyback:160.6] Out: -  Intake/Output this shift: No intake/output data recorded.  Lab Results: Recent Labs    03/12/20 1708  WBC 9.4  HGB 6.5*  HCT 23.1*  PLT 413*   BMET Recent Labs    03/12/20 2001 03/13/20 0519  NA 130* 134*  K 4.5 4.4  CL 98 98  CO2 23 25  GLUCOSE 282* 187*  BUN 20 15  CREATININE 1.06 0.85  CALCIUM 8.7* 9.0   LFT No results for input(s): PROT, ALBUMIN, AST, ALT, ALKPHOS, BILITOT, BILIDIR, IBILI in the last 72 hours. PT/INR No results for input(s): LABPROT, INR in the last 72 hours. Hepatitis Panel No results for input(s): HEPBSAG, HCVAB, HEPAIGM, HEPBIGM in the last  72 hours.   Studies/Results: CT ANGIO AO+BIFEM W & OR WO CONTRAST  Result Date: 03/13/2020 CLINICAL DATA:  69 year old male with claudication. EXAM: CT ANGIOGRAPHY OF ABDOMINAL AORTA WITH ILIOFEMORAL RUNOFF TECHNIQUE: Multidetector CT imaging of the abdomen, pelvis and lower extremities was performed using the standard protocol during bolus administration of intravenous contrast. Multiplanar CT image reconstructions and MIPs were obtained to evaluate the vascular anatomy. CONTRAST:  121mL OMNIPAQUE IOHEXOL 350 MG/ML SOLN COMPARISON:  None. FINDINGS: Evaluation of this exam is limited due to respiratory motion artifact. VASCULAR Aorta: There is advanced atherosclerotic calcification of the abdominal aorta. There is a 4.7 cm fusiform infrarenal abdominal aortic aneurysm measuring approximately 10 cm in craniocaudal length and extends to the level of aortic bifurcation. There is thrombosis of the majority of the lumen of the aneurysm in the periphery. Evaluation of the aneurysm however is limited as precontrast and delayed images are not performed. No periaortic inflammation, fluid collection, or hematoma. Celiac: There is atherosclerotic calcification of the origin of the celiac axis. The celiac artery and its major branches appear patent. SMA: There is atherosclerotic calcification of the origin of the SMA. The SMA remains patent. Renals: Evaluation of the renal arteries is somewhat limited due to suboptimal opacification and respiratory motion. There is atherosclerotic calcification of the origins of the renal arteries. The renal arteries remain patent. IMA: The IMA is not well visualized. RIGHT Lower Extremity Inflow: There is narrowing of the proximal right common iliac artery due to advanced  atherosclerotic disease. There is advanced atherosclerotic calcification of the iliac arteries. Right external iliac artery remains patent. There is diminished flow in the right internal iliac artery. Outflow: There is  advanced atherosclerotic calcification of the right common femoral artery, and superficial femoral artery. There is associated luminal narrowing with decreased flow. Severe atherosclerotic calcification of the distal aspect of the superficial femoral artery with decreased flow. The right popliteal artery is patent but with decreased flow. Runoff: There is atherosclerotic calcification of the proximal arteries of the calf with diminished flow. There is flow within the anterior and posterior tibial arteries to the level of the ankle although with diminished flow. There is flow within the fibular artery. Flow is noted within the visualized proximal portion of the right dorsalis pedis and plantar arteries, although diminished. LEFT Lower Extremity Inflow: There is severe atherosclerotic calcification of the proximal portion of the left common iliac artery. There is advanced atherosclerotic calcification of the iliac arteries on the left. The left external iliac artery remains patent. There is diminished flow in the left internal iliac artery. Outflow: There is advanced atherosclerotic disease in the left common femoral, and superficial femoral artery. There is associated luminal narrowing with diminished flow within these vessels. The deep femoral artery is patent. The popliteal artery is patent although with diminished flow due to advanced atherosclerotic disease and luminal narrowing. Runoff: There is atherosclerotic calcification of the trifurcation of the popliteal artery and proximal portion of the calf vessels. The anterior and posterior tibial arteries as well as the fibular artery are patent to the level of the ankle. The dorsalis pedis and plantar arteries are patent as visualized. Veins: No obvious venous abnormality within the limitations of this arterial phase study. Review of the MIP images confirms the above findings. NON-VASCULAR Lower chest: There are bibasilar dependent atelectasis. There is mild  cardiomegaly. Coronary vascular calcifications noted. No intra-abdominal free air or free fluid. Hepatobiliary: The liver is unremarkable. No intrahepatic biliary ductal dilatation. The gallbladder is unremarkable. Pancreas: Unremarkable. No pancreatic ductal dilatation or surrounding inflammatory changes. Spleen: Normal in size without focal abnormality. Adrenals/Urinary Tract: The adrenal glands are unremarkable. There is no hydronephrosis on either side. Small bilateral renal hypodense lesions are too small to characterize. The visualized ureters and urinary bladder appear unremarkable. Stomach/Bowel: There is no bowel obstruction or active inflammation. The appendix is normal. Lymphatic: No adenopathy. Reproductive: Enlarged prostate gland measuring 5.2 cm in transverse axial diameter. Other: None Musculoskeletal: Degenerative changes of the spine. No acute osseous pathology. There is diffuse subcutaneous edema of the distal lower extremity and feet, right greater than left. IMPRESSION: 1. A 4.7 cm partially thrombosed fusiform infrarenal abdominal aortic aneurysm. Evaluation of the aneurysm is limited as precontrast and delayed images are not performed. No periaortic inflammation or fluid. Clinical correlation and vascular surgery consult is advised. 2. Advanced atherosclerotic calcification of the aorta, iliac arteries, and arteries of the bilateral lower extremities. There is associated luminal narrowing of the vessels of the lower extremities with diminished flow. There is greater involvement of the right lower extremity vessels with more significant diminished inflow in the right lower extremity compared to the left. Flow noted to the level of the ankles bilaterally although significantly diminished on the right. 3. Diffuse subcutaneous edema of the distal calf and feet, right greater than left. 4. No bowel obstruction. Normal appendix. 5. Enlarged prostate gland. Electronically Signed   By: Anner Crete M.D.   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  Also noted history of AAA.  He was scheduled for aortogram and bilateral lower extremity angiogram with possible stenting.  However, it was discovered his hemoglobin was 7.3 about 2 weeks ago and his procedure was scheduled.  He was subsequently referred to GI at our office as an outpatient.  At the pleasure of seeing him yesterday at which point it was noted he has had a slow decline in hemoglobin over the past 6 to 8 months.  Denied significant bleeding from his foot in at least 3 to 4 months.  He denied overt bleeding including hematochezia or melena.  Notes colonoscopy less than 5 years ago apparently at [facility], although I cannot find any records of this.  At that time he also denied significant fatigue, dyspnea, chest pain, dizziness, syncope, near syncope.  However, it was discovered he has been on multiple NSAIDs for his pain likely from his foot wound.  He had been taking Voltaren 75 mg twice daily, Mobic 7.5 mg twice daily, aspirin 81 mg daily, over-the-counter ibuprofen twice daily, aspirin powder (BC or Goody's) about once a week.  Staff scheduling his tentative outpatient endoscopic evaluation states he did admit with dark stools but he felt this was due to Pepto-Bismol.  Stat CBC revealed further decline in hemoglobin to 6.5.  Additionally iron was low at 14, iron sat low at 3%, ferritin low at 9.  I called the patient and his sister to inform them of his lab results and referred him to the emergency department for evaluation, transfusion, and likely admission.  Given his "intermittent" dark stools and multiple NSAIDs I feel he is likely having an upper GI bleed with differentials including gastritis, duodenitis, esophagitis, gastric erosions, duodenal erosions, and peptic ulcer disease.  This is likely causing a slow bleed with,  as expected, decline in hemoglobin.  Thus far he has been transfused 2 units PRBCs.  At the time of this note repeat CBC this morning is still pending.  SARS-CoV-2 negative in the ER.  Heme positive in the ER.  Plan: 1. Follow hemoglobin closely 2. Monitor for any obvious GI bleed 3. Plan for upper endoscopy this admission, potentially today, depending on his hemoglobin after transfusion 4. Avoid all NSAIDs 5. Continue PPI drip 6. Supportive measures   Thank you for allowing [place] to participate in the care of Omar Neighbours, DNP, AGNP-C Adult & Gerontological Nurse Practitioner [facility]   ADDENDUM: CBC returned with hgb now 8.4. Discussed with Dr. Gala Romney, will plan on EGD today.  Proceed with EGD with Dr. Gala Romney in near future: the risks, benefits, and alternatives have been discussed with the patient in detail. The patient states understanding and desires to proceed.  Low dose Lexapro at home, no other sedating medications. Conscious sedation should be adequate.     LOS: 0 days    03/13/2020, 7:42 AM

## 2020-03-13 NOTE — Op Note (Addendum)
[facility] Patient Name: Omar Todd Procedure Date: 03/13/2020 10:55 AM MRN: [id number] Date of Birth: [birth year] Attending MD: Norvel Richards , MD CSN: [id number] Age: 69 Admit Type: Inpatient Procedure:                Upper GI endoscopy Indications:              Melena Providers:                Norvel Richards, MD, Gwynneth Albright RN,                            RN, Raphael Gibney, Technician Referring MD:              Medicines:                Midazolam 2 mg IV, Meperidine 25 mg IV Complications:            No immediate complications. Estimated Blood Loss:     Estimated blood loss was minimal. Procedure:                Pre-Anesthesia Assessment:                           - Prior to the procedure, a History and Physical                            was performed, and patient medications and                            allergies were reviewed. The patient's tolerance of                            previous anesthesia was also reviewed. The risks                            and benefits of the procedure and the sedation                            options and risks were discussed with the patient.                            All questions were answered, and informed consent                            was obtained. Prior Anticoagulants: The patient has                            taken no previous anticoagulant or antiplatelet                            agents. ASA Grade Assessment: III - A patient with                            severe systemic disease. After reviewing the risks  and benefits, the patient was deemed in                            satisfactory condition to undergo the procedure.                           After obtaining informed consent, the endoscope was                            passed under direct vision. Throughout the                            procedure, the patient's blood pressure, pulse, and   oxygen saturations were monitored continuously. The                            [id number]-[id number] scope was introduced through the                            mouth, and advanced to the second part of duodenum.                            The upper GI endoscopy was accomplished without                            difficulty. The patient tolerated the procedure                            well. Scope In: 11:17:56 AM Scope Out: 11:24:46 AM Total Procedure Duration: 0 hours 6 minutes 50 seconds  Findings:      The examined esophagus was normal. Stomach empty. No blood in the upper       GI tract. Patchy erythema - more confluent in the body. No erosions. No       gastric ulcer seen. No infiltrating process. Pylorus patent.       Normal-appearing duodenal bulb, second and third portion.      Biopsies of the distal, mid and proximal stomach taken to assess for H.       pylori      The duodenal bulb, second portion of the duodenum and third portion of       the duodenum were normal. Impression:               - Normal esophagus. Abnormal appearing gastric                            mucosa of uncertain significance. - Normal duodenal                            bulb, second portion of the duodenum and third                            portion of the duodenum.                           Patient with significant progressive  anemia/Hemoccult positive. No prior colonoscopy.                            Query NSAID effect/H pylori. Significant NSAID                            injury to more distal small bowel not excluded as                            is an occult right-sided colon lesion.                           Patient needs his colon evaluated as the next step                            in his evaluation. Moderate Sedation:      Moderate (conscious) sedation was administered by the endoscopy nurse       and supervised by the endoscopist. The following parameters were        monitored: oxygen saturation, heart rate, blood pressure, respiratory       rate, EKG, adequacy of pulmonary ventilation, and response to care.       Total physician intraservice time was 25 minutes. Recommendation:           - Return patient to hospital ward for ongoing care.                           - Clear liquid diet. Discontinue PPI infusion; once                            daily PPI should suffice empirically. Avoid NSAIDs.                            Proceed with a diagnostic colonoscopy tomorrow. At                            patient request, I called his sister, [name],                            at [phone]. Got voicemail. Left a message. Procedure Code(s):        --- Professional ---                           [phone], Esophagogastroduodenoscopy, flexible,                            transoral; diagnostic, including collection of                            specimen(s) by brushing or washing, when performed                            (separate procedure)  [id number], Moderate sedation; each additional 15                            minutes intraservice time                           G0500, Moderate sedation services provided by the                            same physician or other qualified health care                            professional performing a gastrointestinal                            endoscopic service that sedation supports,                            requiring the presence of an independent trained                            observer to assist in the monitoring of the                            patient's level of consciousness and physiological                            status; initial 15 minutes of intra-service time;                            patient age 57 years or older (additional time may                            be reported with [phone], as appropriate) Diagnosis Code(s):        --- Professional ---                            K92.1, Melena (includes Hematochezia) CPT copyright 2019 [employer] Medical Association. All rights reserved. The codes documented in this report are preliminary and upon coder review may  be revised to meet current compliance requirements. [name] Estimable. Charlei Ramsaran, MD Norvel Richards, MD 03/13/2020 11:42:00 AM This report has been signed electronically. Number of Addenda: 0

## 2020-03-13 NOTE — ED Provider Notes (Signed)
Medical screening examination/treatment/procedure(s) were conducted as a shared visit with non-physician practitioner(s) and myself.  I personally evaluated the patient during the encounter.      Patient seen by me along with physician assistant.  Patient had an appointment with GI medicine today.  And his hemoglobin was low.  There is concern was for GI blood loss.  Stool here is heme positive.  His hemoglobin here was 6.5.  Blood transfusion ordered.  Patient hemodynamically stable temp is 98.5 pulse is around 90 blood pressure is 121/61.  Oxygen sat is 97%.  Patient not in any acute distress.  Covid testing was negative.  Patient also known to have peripheral vascular disease.  Being followed by vascular surgery for concerns for vascular insufficiency to the right lower extremity.  Patient denies any severe pain there.  He does have some delayed cap refill but it is about 3 seconds sometimes 2 seconds.  Does have a little bit of skin loss on his toes which are chronic.  The admitting doctor here appropriately wanted CT angio of the leg to rule out any acute vascular problem.  Think clinically probably not and then will decide whether he is admitted here or admitted down to [facility] where vascular surgery would be able to consult.  Patient said 2 units of blood ordered here.  CRITICAL CARE Performed by: Fredia Sorrow Total critical care time: 35 minutes Critical care time was exclusive of separately billable procedures and treating other patients. Critical care was necessary to treat or prevent imminent or life-threatening deterioration. Critical care was time spent personally by me on the following activities: development of treatment plan with patient and/or surrogate as well as nursing, discussions with consultants, evaluation of patient's response to treatment, examination of patient, obtaining history from patient or surrogate, ordering and performing treatments and interventions, ordering and  review of laboratory studies, ordering and review of radiographic studies, pulse oximetry and re-evaluation of patient's condition.    Fredia Sorrow, MD 03/13/20 [phone]

## 2020-03-14 ENCOUNTER — Encounter (HOSPITAL_COMMUNITY)
Admission: EM | Discharge status: Against Medical Advice | Payer: Self-pay | Source: Home / Self Care | Attending: Family Medicine

## 2020-03-14 DIAGNOSIS — I6523 Occlusion and stenosis of bilateral carotid arteries: Secondary | ICD-10-CM

## 2020-03-14 LAB — TYPE AND SCREEN
ABO/RH(D): A POS
Antibody Screen: NEGATIVE
Unit division: 0
Unit division: 0

## 2020-03-14 LAB — BPAM RBC
Blood Product Expiration Date: 202105222359
Blood Product Expiration Date: 202105222359
ISSUE DATE / TIME: 202104230004
ISSUE DATE / TIME: 202104230204
Unit Type and Rh: 6200
Unit Type and Rh: 6200

## 2020-03-14 LAB — CBC
HCT: 30.7 % — ABNORMAL LOW (ref 39.0–52.0)
Hemoglobin: 8.8 g/dL — ABNORMAL LOW (ref 13.0–17.0)
MCH: 22.1 pg — ABNORMAL LOW (ref 26.0–34.0)
MCHC: 28.7 g/dL — ABNORMAL LOW (ref 30.0–36.0)
MCV: 77.1 fL — ABNORMAL LOW (ref 80.0–100.0)
Platelets: 411 10*3/uL — ABNORMAL HIGH (ref 150–400)
RBC: 3.98 MIL/uL — ABNORMAL LOW (ref 4.22–5.81)
RDW: 19.1 % — ABNORMAL HIGH (ref 11.5–15.5)
WBC: 12.6 10*3/uL — ABNORMAL HIGH (ref 4.0–10.5)
nRBC: 0 % (ref 0.0–0.2)

## 2020-03-14 LAB — GLUCOSE, CAPILLARY
Glucose-Capillary: 193 mg/dL — ABNORMAL HIGH (ref 70–99)
Glucose-Capillary: 175 mg/dL — ABNORMAL HIGH (ref 70–99)

## 2020-03-14 SURGERY — COLONOSCOPY
Anesthesia: Moderate Sedation

## 2020-03-14 MED ORDER — ESCITALOPRAM OXALATE 10 MG PO TABS: 10.0000 mg | ORAL_TABLET | Freq: Every day | ORAL | 3 refills | Status: AC

## 2020-03-14 MED ORDER — HYDROCHLOROTHIAZIDE 25 MG PO TABS: 25.0000 mg | ORAL_TABLET | Freq: Every day | ORAL | 3 refills | Status: AC

## 2020-03-14 MED ORDER — MIDAZOLAM HCL 5 MG/5ML IJ SOLN
INTRAMUSCULAR | Status: AC
  Filled 2020-03-14: qty 10

## 2020-03-14 MED ORDER — METFORMIN HCL 1000 MG PO TABS: 1000.0000 mg | ORAL_TABLET | Freq: Two times a day (BID) | ORAL | 3 refills | Status: AC

## 2020-03-14 MED ORDER — FERROUS SULFATE 325 (65 FE) MG PO TABS
325.0000 mg | ORAL_TABLET | Freq: Every day | ORAL | 1 refills | Status: AC
Start: 2020-03-14 — End: ?

## 2020-03-14 MED ORDER — METOPROLOL TARTRATE 25 MG PO TABS: 25.0000 mg | ORAL_TABLET | Freq: Two times a day (BID) | ORAL | 3 refills | Status: AC

## 2020-03-14 MED ORDER — MEPERIDINE HCL 50 MG/ML IJ SOLN
INTRAMUSCULAR | Status: AC
  Filled 2020-03-14: qty 1

## 2020-03-14 MED ORDER — METRONIDAZOLE 500 MG PO TABS
500.0000 mg | ORAL_TABLET | Freq: Three times a day (TID) | ORAL | 0 refills | Status: AC
Start: 2020-03-14 — End: 2020-03-21

## 2020-03-14 MED ORDER — PANTOPRAZOLE SODIUM 40 MG PO TBEC: 40.0000 mg | DELAYED_RELEASE_TABLET | Freq: Every day | ORAL | 2 refills | Status: AC

## 2020-03-14 MED ORDER — LISINOPRIL 40 MG PO TABS: ORAL_TABLET | ORAL | 3 refills | Status: AC

## 2020-03-14 MED ORDER — PRAVASTATIN SODIUM 40 MG PO TABS
40.0000 mg | ORAL_TABLET | Freq: Every evening | ORAL | 5 refills | Status: AC
Start: 2020-03-14 — End: ?

## 2020-03-14 MED ORDER — CEFDINIR 300 MG PO CAPS
300.0000 mg | ORAL_CAPSULE | Freq: Two times a day (BID) | ORAL | 0 refills | Status: AC
Start: 2020-03-14 — End: 2020-03-21

## 2020-03-14 MED ORDER — AMLODIPINE BESYLATE 10 MG PO TABS: 10.0000 mg | ORAL_TABLET | Freq: Every day | ORAL | 3 refills | Status: AC

## 2020-03-14 MED ORDER — ASPIRIN EC 81 MG PO TBEC: 81.0000 mg | DELAYED_RELEASE_TABLET | Freq: Every day | ORAL | 3 refills | Status: AC

## 2020-03-14 MED ORDER — SITAGLIPTIN PHOSPHATE 100 MG PO TABS: 100.0000 mg | ORAL_TABLET | Freq: Every day | ORAL | 4 refills | Status: AC

## 2020-03-14 MED ORDER — JARDIANCE 25 MG PO TABS
25.0000 mg | ORAL_TABLET | Freq: Every day | ORAL | 3 refills | Status: AC
Start: 2020-03-14 — End: ?

## 2020-03-14 MED ORDER — ONDANSETRON HCL 4 MG/2ML IJ SOLN
INTRAMUSCULAR | Status: AC
  Filled 2020-03-14: qty 2

## 2020-03-14 NOTE — Progress Notes (Signed)
Called Patient's Nurse Elodia Florence to see if patient ready for transport to procedure area.  Per Beverlee Nims, patient had two cups of liquid preparation  left to drink in room, stool formed and mush, patient's bed wet, Per Beverlee Nims, hospitalist, Walden Field NP and Dr. Gala Romney had been notified. Due to incomplete preparation and report of formed stool, per Dr. Gala Romney case cancelled for Saturday 03/14/2020.

## 2020-03-14 NOTE — Progress Notes (Signed)
Patient signed AMA paperwork after receiving education bu nurse Britta Mccreedy and Dr. Joesph Fillers. Patient stated he understood the risk. Mr. Omar Todd cognition was challenged and responded with sound mind. Elodia Florence RN

## 2020-03-14 NOTE — Progress Notes (Signed)
Patient does not want to have his colonoscopy while inpatient.  States he wants to go home and take care of some things and he will return as an outpatient.  Nursing staff reports he likely did not get his second round of GoLYTELY.  Formed stool this a.m. Kandace Blitz, sister, who accompanies him this morning states that second jug of prep was spilled into his bedding and onto the floor overnight   Hemoglobin 8.8 this morning.  Vital signs in last 24 hours: Temp:  [97.8 F (36.6 C)-98.9 F (37.2 C)] 98.9 F (37.2 C) (04/24 0605) Pulse Rate:  [69-93] 83 (04/24 0605) Resp:  [20-27] 20 (04/23 1531) BP: (103-147)/(51-85) 135/66 (04/24 0818) SpO2:  [88 %-99 %] 92 % (04/24 0605) Weight:  [97.6 kg] 97.6 kg (04/24 0455) Last BM Date: 03/13/20 General:   Alert,  and cooperative in NAD Abdomen: Soft and nontender.   Intake/Output from previous day: 04/23 0701 - 04/24 0700 In: 200 [I.V.:200] Out: -  Intake/Output this shift: No intake/output data recorded.  Lab Results: Recent Labs    03/12/20 1708 03/13/20 0519  WBC 9.4 8.3  HGB 6.5* 8.4*  HCT 23.1* 28.7*  PLT 413* 387   BMET Recent Labs    03/12/20 2001 03/13/20 0519  NA 130* 134*  K 4.5 4.4  CL 98 98  CO2 23 25  GLUCOSE 282* 187*  BUN 20 15  CREATININE 1.06 0.85  CALCIUM 8.7* 9.0   LFT No results for input(s): PROT, ALBUMIN, AST, ALT, ALKPHOS, BILITOT, BILIDIR, IBILI in the last 72 hours. PT/INR No results for input(s): LABPROT, INR in the last 72 hours. Hepatitis Panel No results for input(s): HEPBSAG, HCVAB, HEPAIGM, HEPBIGM in the last 72 hours. C-Diff No results for input(s): CDIFFTOX in the last 72 hours.  Studies/Results: CT ANGIO AO+BIFEM W & OR WO CONTRAST  Result Date: 03/13/2020 CLINICAL DATA:  69 year old male with claudication. EXAM: CT ANGIOGRAPHY OF ABDOMINAL AORTA WITH ILIOFEMORAL RUNOFF TECHNIQUE: Multidetector CT imaging of the abdomen, pelvis and lower extremities was performed using the  standard protocol during bolus administration of intravenous contrast. Multiplanar CT image reconstructions and MIPs were obtained to evaluate the vascular anatomy. CONTRAST:  130mL OMNIPAQUE IOHEXOL 350 MG/ML SOLN COMPARISON:  None. FINDINGS: Evaluation of this exam is limited due to respiratory motion artifact. VASCULAR Aorta: There is advanced atherosclerotic calcification of the abdominal aorta. There is a 4.7 cm fusiform infrarenal abdominal aortic aneurysm measuring approximately 10 cm in craniocaudal length and extends to the level of aortic bifurcation. There is thrombosis of the majority of the lumen of the aneurysm in the periphery. Evaluation of the aneurysm however is limited as precontrast and delayed images are not performed. No periaortic inflammation, fluid collection, or hematoma. Celiac: There is atherosclerotic calcification of the origin of the celiac axis. The celiac artery and its major branches appear patent. SMA: There is atherosclerotic calcification of the origin of the SMA. The SMA remains patent. Renals: Evaluation of the renal arteries is somewhat limited due to suboptimal opacification and respiratory motion. There is atherosclerotic calcification of the origins of the renal arteries. The renal arteries remain patent. IMA: The IMA is not well visualized. RIGHT Lower Extremity Inflow: There is narrowing of the proximal right common iliac artery due to advanced atherosclerotic disease. There is advanced atherosclerotic calcification of the iliac arteries. Right external iliac artery remains patent. There is diminished flow in the right internal iliac artery. Outflow: There is advanced atherosclerotic calcification of the right common femoral  artery, and superficial femoral artery. There is associated luminal narrowing with decreased flow. Severe atherosclerotic calcification of the distal aspect of the superficial femoral artery with decreased flow. The right popliteal artery is patent  but with decreased flow. Runoff: There is atherosclerotic calcification of the proximal arteries of the calf with diminished flow. There is flow within the anterior and posterior tibial arteries to the level of the ankle although with diminished flow. There is flow within the fibular artery. Flow is noted within the visualized proximal portion of the right dorsalis pedis and plantar arteries, although diminished. LEFT Lower Extremity Inflow: There is severe atherosclerotic calcification of the proximal portion of the left common iliac artery. There is advanced atherosclerotic calcification of the iliac arteries on the left. The left external iliac artery remains patent. There is diminished flow in the left internal iliac artery. Outflow: There is advanced atherosclerotic disease in the left common femoral, and superficial femoral artery. There is associated luminal narrowing with diminished flow within these vessels. The deep femoral artery is patent. The popliteal artery is patent although with diminished flow due to advanced atherosclerotic disease and luminal narrowing. Runoff: There is atherosclerotic calcification of the trifurcation of the popliteal artery and proximal portion of the calf vessels. The anterior and posterior tibial arteries as well as the fibular artery are patent to the level of the ankle. The dorsalis pedis and plantar arteries are patent as visualized. Veins: No obvious venous abnormality within the limitations of this arterial phase study. Review of the MIP images confirms the above findings. NON-VASCULAR Lower chest: There are bibasilar dependent atelectasis. There is mild cardiomegaly. Coronary vascular calcifications noted. No intra-abdominal free air or free fluid. Hepatobiliary: The liver is unremarkable. No intrahepatic biliary ductal dilatation. The gallbladder is unremarkable. Pancreas: Unremarkable. No pancreatic ductal dilatation or surrounding inflammatory changes. Spleen: Normal  in size without focal abnormality. Adrenals/Urinary Tract: The adrenal glands are unremarkable. There is no hydronephrosis on either side. Small bilateral renal hypodense lesions are too small to characterize. The visualized ureters and urinary bladder appear unremarkable. Stomach/Bowel: There is no bowel obstruction or active inflammation. The appendix is normal. Lymphatic: No adenopathy. Reproductive: Enlarged prostate gland measuring 5.2 cm in transverse axial diameter. Other: None Musculoskeletal: Degenerative changes of the spine. No acute osseous pathology. There is diffuse subcutaneous edema of the distal lower extremity and feet, right greater than left. IMPRESSION: 1. A 4.7 cm partially thrombosed fusiform infrarenal abdominal aortic aneurysm. Evaluation of the aneurysm is limited as precontrast and delayed images are not performed. No periaortic inflammation or fluid. Clinical correlation and vascular surgery consult is advised. 2. Advanced atherosclerotic calcification of the aorta, iliac arteries, and arteries of the bilateral lower extremities. There is associated luminal narrowing of the vessels of the lower extremities with diminished flow. There is greater involvement of the right lower extremity vessels with more significant diminished inflow in the right lower extremity compared to the left. Flow noted to the level of the ankles bilaterally although significantly diminished on the right. 3. Diffuse subcutaneous edema of the distal calf and feet, right greater than left. 4. No bowel obstruction. Normal appendix. 5. Enlarged prostate gland. Electronically Signed   By: Anner Crete M.D.   On: 03/13/2020 00:15   ECHOCARDIOGRAM COMPLETE  Result Date: 03/13/2020    ECHOCARDIOGRAM REPORT   Patient Name:   KAYDIEN SLINGER Date of Exam: 03/13/2020 Medical Rec #:  [id number]      Height:       74.0  in Accession #:    [id number]     Weight:       211.2 lb Date of Birth:  25-Jan-1951     BSA:           2.224 m Patient Age:    14 years       BP:           136/78 mmHg Patient Gender: M              HR:           77 bpm. Exam Location:  [name] Procedure: 2D Echo Indications:    Murmur 785.2 / R01.1  History:        Patient has prior history of Echocardiogram examinations, most                 recent 02/06/2014. COPD; Risk Factors:Hypertension, Diabetes,                 Dyslipidemia and Current Smoker.  Sonographer:    Leavy Cella RDCS (AE) Referring Phys: [place]  1. Left ventricular ejection fraction, by estimation, is 50 to 55%. The left ventricle has low normal function. Left ventricular endocardial border not optimally defined to evaluate regional wall motion. There is mild left ventricular hypertrophy. Left ventricular diastolic parameters are indeterminate.  2. Right ventricular systolic function is normal. The right ventricular size is normal.  3. The mitral valve is normal in structure. Trivial mitral valve regurgitation. No evidence of mitral stenosis.  4. The aortic valve has an indeterminant number of cusps. Aortic valve regurgitation is mild. Mild to moderate aortic valve stenosis. Aortic valve mean gradient measures 19.1 mmHg. Aortic valve peak gradient measures 35.2 mmHg. Aortic valve area, by VTI  measures 1.18 cm FINDINGS  Left Ventricle: Left ventricular ejection fraction, by estimation, is 50 to 55%. The left ventricle has low normal function. Left ventricular endocardial border not optimally defined to evaluate regional wall motion. The left ventricular internal cavity  size was normal in size. There is mild left ventricular hypertrophy. Left ventricular diastolic parameters are indeterminate. Right Ventricle: The right ventricular size is normal. No increase in right ventricular wall thickness. Right ventricular systolic function is normal. Left Atrium: Left atrial size was normal in size. Right Atrium: Right atrial size was normal in size. Pericardium: There  is no evidence of pericardial effusion. Mitral Valve: The mitral valve is normal in structure. Trivial mitral valve regurgitation. No evidence of mitral valve stenosis. Tricuspid Valve: The tricuspid valve is normal in structure. Tricuspid valve regurgitation is not demonstrated. No evidence of tricuspid stenosis. Aortic Valve: The aortic valve has an indeterminant number of cusps. . There is severe thickening and severe calcifcation of the aortic valve. Aortic valve regurgitation is mild. Mild to moderate aortic stenosis is present. Severe aortic valve annular calcification. There is severe thickening of the aortic valve. There is severe calcifcation of the aortic valve. Aortic valve mean gradient measures 19.1 mmHg. Aortic valve peak gradient measures 35.2 mmHg. Aortic valve area, by VTI measures 1.18 cm. Pulmonic Valve: The pulmonic valve was not well visualized. Pulmonic valve regurgitation is not visualized. No evidence of pulmonic stenosis. Aorta: The aortic root is normal in size and structure. Pulmonary Artery: Indeterminant PASP, inadequate TR jet. Venous: The inferior vena cava was not well visualized. IAS/Shunts: No atrial level shunt detected by color flow Doppler.  LEFT VENTRICLE PLAX 2D LVIDd:         5.09  cm  Diastology LVIDs:         4.12 cm  LV e' lateral:   7.94 cm/s LV PW:         1.10 cm  LV E/e' lateral: 14.1 LV IVS:        1.07 cm  LV e' medial:    8.59 cm/s LVOT diam:     2.30 cm  LV E/e' medial:  13.0 LV SV:         79 LV SV Index:   36 LVOT Area:     4.15 cm  RIGHT VENTRICLE RV S prime:     13.70 cm/s TAPSE (M-mode): 2.6 cm LEFT ATRIUM           Index LA diam:      3.90 cm 1.75 cm/m LA Vol (A4C): 59.8 ml 26.88 ml/m  AORTIC VALVE AV Area (Vmax):    1.17 cm AV Area (Vmean):   1.20 cm AV Area (VTI):     1.18 cm AV Vmax:           296.74 cm/s AV Vmean:          203.330 cm/s AV VTI:            0.669 m AV Peak Grad:      35.2 mmHg AV Mean Grad:      19.1 mmHg LVOT Vmax:         83.27 cm/s  LVOT Vmean:        58.596 cm/s LVOT VTI:          0.190 m LVOT/AV VTI ratio: 0.28  AORTA Ao Root diam: 3.20 cm MITRAL VALVE MV Area (PHT): 4.46 cm     SHUNTS MV Decel Time: 170 msec     Systemic VTI:  0.19 m MV E velocity: 112.00 cm/s  Systemic Diam: 2.30 cm MV A velocity: 54.80 cm/s MV E/A ratio:  2.04 Carlyle Dolly MD Electronically signed by Carlyle Dolly MD Signature Date/Time: 03/13/2020/12:57:29 PM    Final     Impression: 69 year old gentleman multiple comorbidities admitted to hospital with profound microcytic anemia,  Hemoccult positive stool.  Iron deficiency documented. EGD yesterday did not demonstrate a satisfactory explanation for his anemia.  First ever diagnostic colonoscopy planned for this morning.  Unfortunately, prep was not successful.  I had a lengthy conversation with the patient and his sister, Kandace Blitz, who accompanies him today regarding recovering the situation -I offered to provide him with additional prep today and perform a diagnostic colonoscopy tomorrow morning.  Patient declines to go this route.  He desires to go home and tells me he is willing to come back for an outpatient colonoscopy.  I stressed the importance of not procrastinating as this procedure is very important.  He could have a significant lesion in his colon which may well explain IDA/GI bleed.  Putting off evaluation not recommended as he could jeopardize his health.  Recommendations:  I will have my office reach out to him the first of the week and get him on the schedule for a diagnostic colonoscopy as soon as feasible.

## 2020-03-14 NOTE — Discharge Summary (Signed)
Omar Todd, is a 69 y.o. male  DOB Jun 29, 1951  MRN [id number].  Admission date:  03/12/2020  Admitting Physician  Lynetta Mare, MD  Discharge Date:  03/14/2020   Primary MD  [name], DO  Recommendations for primary care physician for things to follow:   1)Very low-salt diet advised 2)Weigh yourself daily, call if you gain more than 3 pounds in 1 day or more than 5 pounds in 1 week as your diuretic medications may need to be adjusted 3)Limit your Fluid  intake to no more than 60 ounces (1.8 Liters) per day 4)Avoid ibuprofen/Advil/Aleve/Motrin/Goody Powders/Naproxen/BC powders/Meloxicam/Diclofenac/Indomethacin and other Nonsteroidal anti-inflammatory medications as these will make you more likely to bleed and can cause stomach ulcers, can also cause Kidney problems.  5)it is  very important that you follow-up with gastroenterologist Dr. 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, unspecified ulceration site ([place]) [I70.239, I70.249]   Discharge Diagnosis  GI bleed [K92.2] Iron deficiency anemia due to chronic blood loss [D50.0] Gastrointestinal hemorrhage, unspecified gastrointestinal hemorrhage type [K92.2] Abdominal aortic aneurysm (AAA) without rupture (HCC) [I71.4] 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 COPD (chronic obstructive pulmonary disease) ([place])   . Diabetes mellitus without complication ([place])   . Gangrene of toe of right foot ([place])   . GERD (gastroesophageal reflux disease)   . Hyperlipidemia   . Hypertension     Past Surgical History:  Procedure Laterality Date  . Thumb surgery        HPI  from the history and physical done on the day of admission:    HPI: Omar Todd is a 69 y.o. male with medical history significant of COPD, hypertension, hyperlipidemia, DM type 2, COPD, anemia who presented to the ER after being sent in by gastroenterology due to worsening anemia.  Patient was noted to have a drop in hemoglobin with current level being 6.5.  He was sent in for further evaluation for blood transfusion and potential GI work-up.  He reports having a wound  and gangrene over the right toe over the past 5 months which has gotten worse and he has been taking ibuprofen, meloxicam, diclofenac, baby aspirin. He denies any hematemesis, melena, hematochezia, abdominal pain, chest pain, nausea, vomiting, diarrhea, dizziness, lightheadedness, syncope, seizures.  He does endorse increased weakness over the last several weeks. ED Course:  Vital Signs reviewed on presentation, significant for temperature 98.3, heart rate 84, blood pressure 130/67, saturation 97% on room air. Labs reviewed, significant for sodium 130, potassium 4.5, BUN 20, creatinine 1.06, ferritin 9, iron 14, WBC count 9.4,  hemoglobin 6.5, hematocrit 23, MCV 73, platelets 413, Fecal occult blood is positive, SARS Covid RT-PCR negative, flu PCR negative. Imaging personally Reviewed, CT angiogram of the abdomen and bifemoral runoff shows a 4.7 cm partially thrombosed fusiform infrarenal abdominal aortic aneurysm.  There is advanced atherosclerotic calcification of the aorta, iliac arteries and bilateral lower extremity arteries.  Associated luminal narrowing of the vessels of the lower extremities with diminished flow, greater involvement on the right than the left.  Flow is noted to the level of the ankles bilaterally although significantly diminished on the right.  Diffuse subcutaneous edema of the distal calf and feet right better than left.  No bowel obstruction.   Hospital Course:   Brief Summary:- -68 y.o.malewith medical history significant ofCOPD, HLD, PAD/AAA,  DM type 2, chronic iron deficiency anemia anemia-presenting with with Hgb 6.5, He reports having a wound and gangrene over the right toe over the past 5 months which has gotten worse and he has been taking ibuprofen, meloxicam, diclofenac, aspirin powder (goody/BC) --Patient with significant progressive anemia / Hemoccult positive.  -He was scheduled for aortogram and bilateral lower extremity angiogram with possible stenting by vascular surgery and discovered to be anemic -Patient left AMA despite persuasion from gastroenterologist, myself, bedside RN and patient's sister Kandace Blitz, at bedside  A/p 1)Acute GI bleed--- patient with NSAID use, EGD unrevealing, plan for colonoscopy on 03/14/2020, patient had poor colon prep due to not drinking the GoLYTELY -Gastroenterology to encourage patient to repeat colon prep [facility] colonoscopy on 03/15/2020 but patient declines -Abstinence from NSAIDs advised  2)Acute on chronic iron deficiency symptomatic anemia--- hemoglobin up to 8.8 from 6.5 after transfusion of 2 units of blood--- -- 3)Right foot  Gangrene/Cellulitis-- Patient has multiple gangrenous lesions over right foot with a lesion over the right heel, right great toe, third fourth and fifth toe. No significant purulent drainage noted. Some redness is noted on the wounds. -Vascular surgeon wants to await GI work-up prior to any intervention Treated with IV cefepime -Patient left AMA -Prescription for Omnicef and Flagyl called into pharmacy for him--sister promises to pick up prescription for patient  4)PAD/AAA/nonhealing right foot gangrenous wounds--- CT angiogram shows a 4.7 cm partially thrombosed abdominal aortic aneurysm. ----He was scheduled for aortogram and bilateral lower extremity angiogram with possible stenting by vascular surgery and discovered to be anemic -Continue pravastatin, plan was to restart antiplatelet agent after GI work-up is completed  5)DM2-A1c 7.9, reflecting uncontrolled DM PTA  -Resume PTA meds  6) COPD and ongoing tobacco abuse--- not interested in smoking cessation, no COPD exacerbation at this time -No further hypoxia   7) mild to moderate aortic stenosis--echo from 03/13/2020 noted, EF is preserved at 50 to 55%   Disposition/--Patient left AMA despite persuasion from gastroenterologist, myself, bedside RN and patient's sister Kandace Blitz, at bedside  -Patient From: home D/C Place: home--- left AGAINST MEDICAL ADVICE  barriers: Not Clinically Stable-   Code Status :  full  Procedure:- EGD on 03/13/20  Family Communication: Sister at bedside  Consults  :  Gi  Discharge Condition: Left AGAINST MEDICAL ADVICE  Follow UP  Follow-up Information    [name], [name] Estimable, MD. Schedule an appointment as soon as possible for a visit in 1 week(s).   Specialty: Gastroenterology Contact information: [address] [place] [address] [phone]           Diet and Activity recommendation:  As advised  Discharge Instructions    Discharge Instructions    Call MD for:   difficulty breathing, headache or visual disturbances   Complete by: As directed    Call MD for:  persistant dizziness or light-headedness   Complete by: As directed    Call MD for:  persistant nausea and vomiting   Complete by: As directed    Call MD for:  severe uncontrolled pain   Complete by: As directed    Call MD for:  temperature >100.4   Complete by: As directed    Diet - low sodium heart healthy   Complete by: As directed    Diet Carb Modified   Complete by: As directed    Discharge instructions   Complete by: As directed    1)Very low-salt diet advised 2)Weigh yourself daily, call if you gain more than 3 pounds in 1 day or more than 5 pounds in 1 week as your diuretic medications may need to be adjusted 3)Limit your Fluid  intake to no more than 60 ounces (1.8 Liters) per day 4)Avoid ibuprofen/Advil/Aleve/Motrin/Goody Powders/Naproxen/BC powders/Meloxicam/Diclofenac/Indomethacin and other Nonsteroidal anti-inflammatory medications as these will make you more likely to bleed and can cause stomach ulcers, can also cause Kidney problems.  5)it is  very important that you follow-up with gastroenterologist Dr. Gala Romney to get your colonoscopy as you may have a tumor in your bowels that may be deadly 6) you need treatment for your right leg poor circulation and infection--- failure to get proper treatment from this will lead to possible amputation and potentially Death   Increase activity slowly   Complete by: As directed        Discharge Medications     Allergies as of 03/14/2020      Reactions   Penicillins Other (See Comments)   Pt. States he "passed out"      Medication List    STOP taking these medications   Clenpiq 10-3.5-12 MG-GM -GM/160ML Soln Generic drug: Sod Picosulfate-Mag Ox-Cit Acd   diclofenac 75 MG EC tablet Commonly known as: VOLTAREN   meloxicam 7.5 MG tablet Commonly known as: MOBIC   omeprazole 20 MG capsule Commonly known as: PRILOSEC     TAKE  these medications   amLODipine 10 MG tablet Commonly known as: NORVASC Take 1 tablet (10 mg total) by mouth daily.   aspirin EC 81 MG tablet Take 1 tablet (81 mg total) by mouth daily with breakfast. What changed: when to take this   BIOTIN 5000 PO Take 1 capsule by mouth daily.   cefdinir 300 MG capsule Commonly known as: OMNICEF Take 1 capsule (300 mg total) by mouth 2 (two) times daily for 7 days.   cholecalciferol 1000 units tablet Commonly known as: VITAMIN D Take 1,000 Units by mouth daily.   escitalopram 10 MG tablet Commonly known as: LEXAPRO Take 1 tablet (10 mg total) by mouth daily. What changed: See the new instructions.   ferrous sulfate 325 (65 FE) MG tablet Take 1 tablet (325 mg total) by  mouth daily with breakfast. What changed: when to take this   hydrochlorothiazide 25 MG tablet Commonly known as: HYDRODIURIL Take 1 tablet (25 mg total) by mouth daily.   Jardiance 25 MG Tabs tablet Generic drug: empagliflozin Take 25 mg by mouth daily before breakfast.   lisinopril 40 MG tablet Commonly known as: ZESTRIL TAKE ONE (1) TABLET EACH DAY   metFORMIN 1000 MG tablet Commonly known as: GLUCOPHAGE Take 1 tablet (1,000 mg total) by mouth 2 (two) times daily with a meal.   metoprolol tartrate 25 MG tablet Commonly known as: LOPRESSOR Take 1 tablet (25 mg total) by mouth 2 (two) times daily.   metroNIDAZOLE 500 MG tablet Commonly known as: Flagyl Take 1 tablet (500 mg total) by mouth 3 (three) times daily for 7 days.   montelukast 10 MG tablet Commonly known as: SINGULAIR TAKE ONE TABLET DAILY AT BEDTIME   mupirocin cream 2 % Commonly known as: BACTROBAN Apply 1 application topically 2 (two) times daily.   pantoprazole 40 MG tablet Commonly known as: PROTONIX Take 1 tablet (40 mg total) by mouth daily. Start taking on: March 15, 2020   pravastatin 40 MG tablet Commonly known as: PRAVACHOL Take 1 tablet (40 mg total) by mouth every evening.     Santyl ointment Generic drug: collagenase Apply 1 application topically daily. Ulcers to right 4th sulcus, right 1st toe and right heel measurements sent on [place] form. What changed: when to take this   [name] 100 MG tablet Commonly known as: Januvia Take 1 tablet (100 mg total) by mouth daily.       Major procedures and Radiology Reports - PLEASE review detailed and final reports for all details, in brief -    CT ANGIO AO+BIFEM W & OR WO CONTRAST  Result Date: 03/13/2020 CLINICAL DATA:  69 year old male with claudication. EXAM: CT ANGIOGRAPHY OF ABDOMINAL AORTA WITH ILIOFEMORAL RUNOFF TECHNIQUE: Multidetector CT imaging of the abdomen, pelvis and lower extremities was performed using the standard protocol during bolus administration of intravenous contrast. Multiplanar CT image reconstructions and MIPs were obtained to evaluate the vascular anatomy. CONTRAST:  19mL OMNIPAQUE IOHEXOL 350 MG/ML SOLN COMPARISON:  None. FINDINGS: Evaluation of this exam is limited due to respiratory motion artifact. VASCULAR Aorta: There is advanced atherosclerotic calcification of the abdominal aorta. There is a 4.7 cm fusiform infrarenal abdominal aortic aneurysm measuring approximately 10 cm in craniocaudal length and extends to the level of aortic bifurcation. There is thrombosis of the majority of the lumen of the aneurysm in the periphery. Evaluation of the aneurysm however is limited as precontrast and delayed images are not performed. No periaortic inflammation, fluid collection, or hematoma. Celiac: There is atherosclerotic calcification of the origin of the celiac axis. The celiac artery and its major branches appear patent. SMA: There is atherosclerotic calcification of the origin of the SMA. The SMA remains patent. Renals: Evaluation of the renal arteries is somewhat limited due to suboptimal opacification and respiratory motion. There is atherosclerotic calcification of the  origins of the renal arteries. The renal arteries remain patent. IMA: The IMA is not well visualized. RIGHT Lower Extremity Inflow: There is narrowing of the proximal right common iliac artery due to advanced atherosclerotic disease. There is advanced atherosclerotic calcification of the iliac arteries. Right external iliac artery remains patent. There is diminished flow in the right internal iliac artery. Outflow: There is advanced atherosclerotic calcification of the right common femoral artery, and superficial femoral artery. There is associated luminal narrowing  with decreased flow. Severe atherosclerotic calcification of the distal aspect of the superficial femoral artery with decreased flow. The right popliteal artery is patent but with decreased flow. Runoff: There is atherosclerotic calcification of the proximal arteries of the calf with diminished flow. There is flow within the anterior and posterior tibial arteries to the level of the ankle although with diminished flow. There is flow within the fibular artery. Flow is noted within the visualized proximal portion of the right dorsalis pedis and plantar arteries, although diminished. LEFT Lower Extremity Inflow: There is severe atherosclerotic calcification of the proximal portion of the left common iliac artery. There is advanced atherosclerotic calcification of the iliac arteries on the left. The left external iliac artery remains patent. There is diminished flow in the left internal iliac artery. Outflow: There is advanced atherosclerotic disease in the left common femoral, and superficial femoral artery. There is associated luminal narrowing with diminished flow within these vessels. The deep femoral artery is patent. The popliteal artery is patent although with diminished flow due to advanced atherosclerotic disease and luminal narrowing. Runoff: There is atherosclerotic calcification of the trifurcation of the popliteal artery and proximal portion of  the calf vessels. The anterior and posterior tibial arteries as well as the fibular artery are patent to the level of the ankle. The dorsalis pedis and plantar arteries are patent as visualized. Veins: No obvious venous abnormality within the limitations of this arterial phase study. Review of the MIP images confirms the above findings. NON-VASCULAR Lower chest: There are bibasilar dependent atelectasis. There is mild cardiomegaly. Coronary vascular calcifications noted. No intra-abdominal free air or free fluid. Hepatobiliary: The liver is unremarkable. No intrahepatic biliary ductal dilatation. The gallbladder is unremarkable. Pancreas: Unremarkable. No pancreatic ductal dilatation or surrounding inflammatory changes. Spleen: Normal in size without focal abnormality. Adrenals/Urinary Tract: The adrenal glands are unremarkable. There is no hydronephrosis on either side. Small bilateral renal hypodense lesions are too small to characterize. The visualized ureters and urinary bladder appear unremarkable. Stomach/Bowel: There is no bowel obstruction or active inflammation. The appendix is normal. Lymphatic: No adenopathy. Reproductive: Enlarged prostate gland measuring 5.2 cm in transverse axial diameter. Other: None Musculoskeletal: Degenerative changes of the spine. No acute osseous pathology. There is diffuse subcutaneous edema of the distal lower extremity and feet, right greater than left. IMPRESSION: 1. A 4.7 cm partially thrombosed fusiform infrarenal abdominal aortic aneurysm. Evaluation of the aneurysm is limited as precontrast and delayed images are not performed. No periaortic inflammation or fluid. Clinical correlation and vascular surgery consult is advised. 2. Advanced atherosclerotic calcification of the aorta, iliac arteries, and arteries of the bilateral lower extremities. There is associated luminal narrowing of the vessels of the lower extremities with diminished flow. There is greater involvement  of the right lower extremity vessels with more significant diminished inflow in the right lower extremity compared to the left. Flow noted to the level of the ankles bilaterally although significantly diminished on the right. 3. Diffuse subcutaneous edema of the distal calf and feet, right greater than left. 4. No bowel obstruction. Normal appendix. 5. Enlarged prostate gland. Electronically Signed   By: Anner Crete M.D.   On: 03/13/2020 00:15   DG Foot Complete Right  Result Date: 02/13/2020 Please see detailed radiograph report in office note.  ECHOCARDIOGRAM COMPLETE  Result Date: 03/13/2020    ECHOCARDIOGRAM REPORT   Patient Name:   Omar Todd Date of Exam: 03/13/2020 Medical Rec #:  [id number]      Height:  74.0 in Accession #:    [id number]     Weight:       211.2 lb Date of Birth:  [birth year]     BSA:          2.224 m Patient Age:    43 years       BP:           136/78 mmHg Patient Gender: M              HR:           77 bpm. Exam Location:  [name] Procedure: 2D Echo Indications:    Murmur 785.2 / R01.1  History:        Patient has prior history of Echocardiogram examinations, most                 recent 02/06/2014. COPD; Risk Factors:Hypertension, Diabetes,                 Dyslipidemia and Current Smoker.  Sonographer:    Leavy Cella RDCS (AE) Referring Phys: [place]  1. Left ventricular ejection fraction, by estimation, is 50 to 55%. The left ventricle has low normal function. Left ventricular endocardial border not optimally defined to evaluate regional wall motion. There is mild left ventricular hypertrophy. Left ventricular diastolic parameters are indeterminate.  2. Right ventricular systolic function is normal. The right ventricular size is normal.  3. The mitral valve is normal in structure. Trivial mitral valve regurgitation. No evidence of mitral stenosis.  4. The aortic valve has an indeterminant number of cusps. Aortic valve regurgitation is  mild. Mild to moderate aortic valve stenosis. Aortic valve mean gradient measures 19.1 mmHg. Aortic valve peak gradient measures 35.2 mmHg. Aortic valve area, by VTI  measures 1.18 cm FINDINGS  Left Ventricle: Left ventricular ejection fraction, by estimation, is 50 to 55%. The left ventricle has low normal function. Left ventricular endocardial border not optimally defined to evaluate regional wall motion. The left ventricular internal cavity  size was normal in size. There is mild left ventricular hypertrophy. Left ventricular diastolic parameters are indeterminate. Right Ventricle: The right ventricular size is normal. No increase in right ventricular wall thickness. Right ventricular systolic function is normal. Left Atrium: Left atrial size was normal in size. Right Atrium: Right atrial size was normal in size. Pericardium: There is no evidence of pericardial effusion. Mitral Valve: The mitral valve is normal in structure. Trivial mitral valve regurgitation. No evidence of mitral valve stenosis. Tricuspid Valve: The tricuspid valve is normal in structure. Tricuspid valve regurgitation is not demonstrated. No evidence of tricuspid stenosis. Aortic Valve: The aortic valve has an indeterminant number of cusps. . There is severe thickening and severe calcifcation of the aortic valve. Aortic valve regurgitation is mild. Mild to moderate aortic stenosis is present. Severe aortic valve annular calcification. There is severe thickening of the aortic valve. There is severe calcifcation of the aortic valve. Aortic valve mean gradient measures 19.1 mmHg. Aortic valve peak gradient measures 35.2 mmHg. Aortic valve area, by VTI measures 1.18 cm. Pulmonic Valve: The pulmonic valve was not well visualized. Pulmonic valve regurgitation is not visualized. No evidence of pulmonic stenosis. Aorta: The aortic root is normal in size and structure. Pulmonary Artery: Indeterminant PASP, inadequate TR jet. Venous: The inferior vena  cava was not well visualized. IAS/Shunts: No atrial level shunt detected by color flow Doppler.  LEFT VENTRICLE PLAX 2D LVIDd:  5.09 cm  Diastology LVIDs:         4.12 cm  LV e' lateral:   7.94 cm/s LV PW:         1.10 cm  LV E/e' lateral: 14.1 LV IVS:        1.07 cm  LV e' medial:    8.59 cm/s LVOT diam:     2.30 cm  LV E/e' medial:  13.0 LV SV:         79 LV SV Index:   36 LVOT Area:     4.15 cm  RIGHT VENTRICLE RV S prime:     13.70 cm/s TAPSE (M-mode): 2.6 cm LEFT ATRIUM           Index LA diam:      3.90 cm 1.75 cm/m LA Vol (A4C): 59.8 ml 26.88 ml/m  AORTIC VALVE AV Area (Vmax):    1.17 cm AV Area (Vmean):   1.20 cm AV Area (VTI):     1.18 cm AV Vmax:           296.74 cm/s AV Vmean:          203.330 cm/s AV VTI:            0.669 m AV Peak Grad:      35.2 mmHg AV Mean Grad:      19.1 mmHg LVOT Vmax:         83.27 cm/s LVOT Vmean:        58.596 cm/s LVOT VTI:          0.190 m LVOT/AV VTI ratio: 0.28  AORTA Ao Root diam: 3.20 cm MITRAL VALVE MV Area (PHT): 4.46 cm     SHUNTS MV Decel Time: 170 msec     Systemic VTI:  0.19 m MV E velocity: 112.00 cm/s  Systemic Diam: 2.30 cm MV A velocity: 54.80 cm/s MV E/A ratio:  2.04 Carlyle Dolly MD Electronically signed by Carlyle Dolly MD Signature Date/Time: 03/13/2020/12:57:29 PM    Final    VAS [place] LE ART SEG MULTI (Segm&LE Reynauds)  Result Date: 02/29/2020 LOWER EXTREMITY DOPPLER STUDY Indications: Rest pain, ulceration, peripheral artery disease, and ulcer of              right great toe and right heel with fat layer exposed. High Risk Factors: Hypertension, hyperlipidemia, Diabetes, current smoker. Other Factors: SEE BILATERAL ARTERIAL LEG DUPLEX REPORT.  Vascular Interventions: None. Limitations: Today's exam was limited due to patient intolerant to cuff              pressure, an open wound and involuntary patient movement. Comparison Study: None Performing Technologist: Salvadore Dom RVT  Examination Guidelines: A complete evaluation includes at  minimum, Doppler waveform signals and systolic blood pressure reading at the level of bilateral brachial, anterior tibial, and posterior tibial arteries, when vessel segments are accessible. Bilateral testing is considered an integral part of a complete examination. Photoelectric Plethysmograph (PPG) waveforms and toe systolic pressure readings are included as required and additional duplex testing as needed. Limited examinations for reoccurring indications may be performed as noted.  ABI Findings: +---------+------------------+-----+----------+--------+ Right    Rt Pressure (mmHg)IndexWaveform  Comment  +---------+------------------+-----+----------+--------+ Brachial 163                                       +---------+------------------+-----+----------+--------+ CFA  monophasic         +---------+------------------+-----+----------+--------+ Popliteal                       monophasic         +---------+------------------+-----+----------+--------+ ATA                                       no flow  +---------+------------------+-----+----------+--------+ PTA                                       no flow  +---------+------------------+-----+----------+--------+ PERO                                      no flow  +---------+------------------+-----+----------+--------+ Great Toe                                 no flow  +---------+------------------+-----+----------+--------+ +---------+------------------+-----+----------+----------------+ Left     Lt Pressure (mmHg)IndexWaveform  Comment          +---------+------------------+-----+----------+----------------+ Brachial 156                                               +---------+------------------+-----+----------+----------------+ CFA                             monophasic                 +---------+------------------+-----+----------+----------------+ Popliteal                        monophasic                 +---------+------------------+-----+----------+----------------+ ATA      75                0.46 monophasic                 +---------+------------------+-----+----------+----------------+ PTA      81                0.50 monophasic                 +---------+------------------+-----+----------+----------------+ PERO     97                0.60 monophasic                 +---------+------------------+-----+----------+----------------+ Great Toe                                 unable to obtain +---------+------------------+-----+----------+----------------+ +-------+-----------+-----------+------------+------------+ ABI/TBIToday's ABIToday's TBIPrevious ABIPrevious TBI +-------+-----------+-----------+------------+------------+ Right  0          0                                   +-------+-----------+-----------+------------+------------+ Left   .60        0                                   +-------+-----------+-----------+------------+------------+  Technically very difficult secondary to rest pain and involuntary movement.  Summary: Right: Resting right ankle-brachial index indicates critical limb ischemia. The right toe-brachial index is abnormal. Left: Resting left ankle-brachial index indicates moderate left lower extremity arterial disease. The left toe-brachial index is abnormal.  *See table(s) above for measurements and observations.  Electronically signed by Carlyle Dolly MD on 02/29/2020 at 8:27:10 AM.    Final    VAS US CAROTID  Result Date: 03/09/2020 Carotid Arterial Duplex Study Indications:       Carotid artery disease follow-up. Patient denies any                    cerebrovascular symptoms. Risk Factors:      Hypertension, hyperlipidemia, Diabetes, current smoker, PAD. Comparison Study:  Previous carotid duplex performed at [facility] in                    11/17 showed RICA velocities of 123XX123 cm/sec  and LICA                    velocities of 239/61 cm/sec. Performing Technologist: Mariane Masters RVT  Examination Guidelines: A complete evaluation includes B-mode imaging, spectral Doppler, color Doppler, and power Doppler as needed of all accessible portions of each vessel. Bilateral testing is considered an integral part of a complete examination. Limited examinations for reoccurring indications may be performed as noted.  Right Carotid Findings: +----------+--------+--------+--------+------------------------+--------+           PSV cm/sEDV cm/sStenosisPlaque Description      Comments +----------+--------+--------+--------+------------------------+--------+ CCA Prox  125     9                                                +----------+--------+--------+--------+------------------------+--------+ CCA Mid   75      13      <50%    diffuse and heterogenous         +----------+--------+--------+--------+------------------------+--------+ CCA Distal57      11                                               +----------+--------+--------+--------+------------------------+--------+ ICA Prox  110     21                                               +----------+--------+--------+--------+------------------------+--------+ ICA Mid   303     69      60-79%  focal and heterogenous           +----------+--------+--------+--------+------------------------+--------+ ICA Distal81      22                                               +----------+--------+--------+--------+------------------------+--------+ ECA       88      6                                                +----------+--------+--------+--------+------------------------+--------+ +----------+--------+-------+----------------+-------------------+  PSV cm/sEDV cmsDescribe        Arm Pressure (mmHG) +----------+--------+-------+----------------+-------------------+ [id number]             Multiphasic, [id number]                 +----------+--------+-------+----------------+-------------------+ +---------+--------+---+--------+--+---------+ VertebralPSV cm/s102EDV cm/s19Antegrade +---------+--------+---+--------+--+---------+  Left Carotid Findings: +----------+--------+--------+--------+----------------------+--------+           PSV cm/sEDV cm/sStenosisPlaque Description    Comments +----------+--------+--------+--------+----------------------+--------+ CCA Prox  105     23                                             +----------+--------+--------+--------+----------------------+--------+ CCA Mid   138     18      <50%    focal and heterogenous         +----------+--------+--------+--------+----------------------+--------+ CCA Distal128     25              heterogenous                   +----------+--------+--------+--------+----------------------+--------+ ICA Prox  312     78      60-79%  heterogenous                   +----------+--------+--------+--------+----------------------+--------+ ICA Mid   278     51                                             +----------+--------+--------+--------+----------------------+--------+ ICA Distal109     32                                             +----------+--------+--------+--------+----------------------+--------+ ECA       96      7                                              +----------+--------+--------+--------+----------------------+--------+ +----------+--------+--------+----------------+-------------------+           PSV cm/sEDV cm/sDescribe        Arm Pressure (mmHG) +----------+--------+--------+----------------+-------------------+ [id number]             Multiphasic, [id number]                 +----------+--------+--------+----------------+-------------------+ +---------+--------+--+--------+--+---------+ VertebralPSV cm/s61EDV cm/s25Antegrade  +---------+--------+--+--------+--+---------+   Summary: Right Carotid: 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 Non-hemodynamically significant plaque <50% noted in                the CCA. Left Carotid: Velocities in the left ICA are consistent with a 60-79% stenosis.               Non-hemodynamically significant plaque <50% noted in the CCA. Vertebrals:  Bilateral vertebral arteries demonstrate antegrade flow. Subclavians: Normal flow hemodynamics were seen in bilateral subclavian              arteries. *See table(s) above for measurements and observations. Suggest follow up study in 12 months.  Electronically signed by Kathlyn Sacramento MD on 03/09/2020 at 8:28:47 AM.    Final    VAS [place] LOWER EXTREMITY ARTERIAL DUPLEX  Result Date: 02/29/2020 LOWER EXTREMITY ARTERIAL DUPLEX STUDY Indications: Claudication, rest pain, ulceration, and Right foot ulcers on 1st              toe and heel. High Risk Factors: Hypertension, hyperlipidemia, Diabetes, current smoker. Other Factors: COPD                 SEE ABI REPORT.  Current ABI: Right unobtainable Left .60 Limitations: Non fasting afternoon patient. Patient unable to keep still due to              involuntary leg movements, legs kept cramping. Comparison Study: None Performing Technologist: Alecia Mackin RVT, RDCS (AE), RDMS  Examination Guidelines: A complete evaluation includes B-mode imaging, spectral Doppler, color Doppler, and power Doppler as needed of all accessible portions of each vessel. Bilateral testing is considered an integral part of a complete examination. Limited examinations for reoccurring indications may be performed as noted. Aorta: +------+-------+----------+----------+----------+--------+-----+       AP (cm)Trans (cm)PSV (cm/s)Waveform  ThrombusShape +------+-------+----------+----------+----------+--------+-----+ Distal4.90   4.80      30        monophasic               +------+-------+----------+----------+----------+--------+-----+   +-----------+--------+-----+---------------+----------+------------------------+ RIGHT      PSV cm/sRatioStenosis       Waveform  Comments                 +-----------+--------+-----+---------------+----------+------------------------+ CIA Prox                                         not seen due to                                                           overlying bowel gas      +-----------+--------+-----+---------------+----------+------------------------+ EIA Prox                                         not seen due to                                                           overlying bowel gas      +-----------+--------+-----+---------------+----------+------------------------+ EIA Mid    177                         monophasic                         +-----------+--------+-----+---------------+----------+------------------------+ CFA Prox   147                         monophasic                         +-----------+--------+-----+---------------+----------+------------------------+  CFA Distal 134                         monophasic                         +-----------+--------+-----+---------------+----------+------------------------+ DFA        253                         monophasic                         +-----------+--------+-----+---------------+----------+------------------------+ SFA Prox   72                          monophasic                         +-----------+--------+-----+---------------+----------+------------------------+ SFA Mid    288          50-74% stenosismonophasic                         +-----------+--------+-----+---------------+----------+------------------------+ SFA Distal 32                                    ? occlusion / severe                                                      shadowing                 +-----------+--------+-----+---------------+----------+------------------------+ POP Prox                occluded                                          +-----------+--------+-----+---------------+----------+------------------------+ POP Mid    38                          monophasic                         +-----------+--------+-----+---------------+----------+------------------------+ POP Distal 59                          monophasic                         +-----------+--------+-----+---------------+----------+------------------------+ TP Trunk   60                          monophasic                         +-----------+--------+-----+---------------+----------+------------------------+ ATA Distal 10                          monophasic                         +-----------+--------+-----+---------------+----------+------------------------+  PTA Distal 22                          monophasic                         +-----------+--------+-----+---------------+----------+------------------------+ PERO Distal5                           monophasic                         +-----------+--------+-----+---------------+----------+------------------------+ A focal velocity elevation of 288 cm/s was obtained at MID SFA with a VR of 4.0. Findings are characteristic of 50-74% stenosis. Unobtainable proximal and mid calf arteries along with pedal artery due to involuntary patient movement.  +-----------+--------+-----+---------------+----------+------------------------+ LEFT       PSV cm/sRatioStenosis       Waveform  Comments                 +-----------+--------+-----+---------------+----------+------------------------+ CIA Prox   356                         monophasic> 50 % stenosis          +-----------+--------+-----+---------------+----------+------------------------+ CIA Distal                             monophasic                          +-----------+--------+-----+---------------+----------+------------------------+ EIA Prox   157                         monophasic                         +-----------+--------+-----+---------------+----------+------------------------+ EIA Mid    205                         monophasic> 50 % stenosis          +-----------+--------+-----+---------------+----------+------------------------+ EIA Distal 118                         monophasic                         +-----------+--------+-----+---------------+----------+------------------------+ CFA Prox   393          50-74% stenosismonophasic                         +-----------+--------+-----+---------------+----------+------------------------+ CFA Distal 442          75-99% stenosismonophasic                         +-----------+--------+-----+---------------+----------+------------------------+ DFA        317          50-74% stenosismonophasic                         +-----------+--------+-----+---------------+----------+------------------------+ SFA Prox   480          75-99% stenosismonophasic                         +-----------+--------+-----+---------------+----------+------------------------+  SFA Mid    100                         monophasic                         +-----------+--------+-----+---------------+----------+------------------------+ SFA Distal 100                         monophasic                         +-----------+--------+-----+---------------+----------+------------------------+ POP Prox   66                          monophasic                         +-----------+--------+-----+---------------+----------+------------------------+ POP Mid    79                          monophasic                         +-----------+--------+-----+---------------+----------+------------------------+ POP Distal 61                          monophasic                          +-----------+--------+-----+---------------+----------+------------------------+ TP Trunk   152          50-74% stenosismonophasic                         +-----------+--------+-----+---------------+----------+------------------------+ ATA Distal                                       unobtainable due to                                                       patient movement         +-----------+--------+-----+---------------+----------+------------------------+ PTA Distal 41                          monophasic                         +-----------+--------+-----+---------------+----------+------------------------+ PERO Distal26                          monophasic                         +-----------+--------+-----+---------------+----------+------------------------+ A focal velocity elevation of 480 cm/s was obtained at CFA- PRX SFA with a VR of 4.1. Findings are characteristic of 75-99% stenosis. A 2nd focal velocity elevation was visualized, measuring 152 cm/s at TPT with a VR of 2.49. Findings are characteristic of 50-74% stenosis. Unobtainable proximal and mid calf arteries along with pedal artery due to involuntary patient movement.  Summary: Right: 50-74% stenosis  noted in the superficial femoral artery. Heavy calcified vessels throughout. 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 Heavy calcified aortic walls. Limited exam due to non-fasting patient, unable to visualize right CIA and right proximal EIA. Left: 75-99% stenosis noted in the common femoral artery. 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 Heavy calcified vessels with irregular plaque throughout.  See table(s) above for measurements and observations. Patient seeing Dr. Gwenlyn Found today 02/28/2020 3:15 pm. Vascular consult recommended. Electronically signed by Carlyle Dolly MD on 02/29/2020 at 8:58:49 AM.    Final    Micro Results    Recent Results (from the past 240 hour(s))  Respiratory Panel by RT  PCR (Flu A&B, Covid) - Nasopharyngeal Swab     Status: None   Collection Time: 03/12/20  8:32 PM   Specimen: Nasopharyngeal Swab  Result Value Ref Range Status   SARS Coronavirus 2 by RT PCR NEGATIVE NEGATIVE Final    Comment: (NOTE) SARS-CoV-2 target nucleic acids are NOT DETECTED. The SARS-CoV-2 RNA is generally detectable in upper respiratoy specimens during the acute phase of infection. The lowest concentration of SARS-CoV-2 viral copies this assay can detect is 131 copies/mL. A negative result does not preclude SARS-Cov-2 infection and should not be used as the sole basis for treatment or other patient management decisions. A negative result may occur with  improper specimen collection/handling, submission of specimen other than nasopharyngeal swab, presence of viral mutation(s) within the areas targeted by this assay, and inadequate number of viral copies (<131 copies/mL). A negative result must be combined with clinical observations, patient history, and epidemiological information. The expected result is Negative. Fact Sheet for Patients:  PinkCheek.be Fact Sheet for Healthcare Providers:  GravelBags.it This test is not yet ap proved or cleared by the [place] FDA and  has been authorized for detection and/or diagnosis of SARS-CoV-2 by FDA under an Emergency Use Authorization (EUA). This EUA will remain  in effect (meaning this test can be used) for the duration of the COVID-19 declaration under Section 564(b)(1) of the Act, 21 U.S.C. section 360bbb-3(b)(1), unless the authorization is terminated or revoked sooner.    Influenza A by PCR NEGATIVE NEGATIVE Final   Influenza B by PCR NEGATIVE NEGATIVE Final    Comment: (NOTE) The Xpert Xpress SARS-CoV-2/FLU/RSV assay is intended as an aid in  the diagnosis of influenza from Nasopharyngeal swab specimens and  should not be used as a sole basis for treatment. Nasal  washings and  aspirates are unacceptable for Xpert Xpress SARS-CoV-2/FLU/RSV  testing. Fact Sheet for Patients: PinkCheek.be Fact Sheet for Healthcare Providers: GravelBags.it This test is not yet approved or cleared by the [place] FDA and  has been authorized for detection and/or diagnosis of SARS-CoV-2 by  FDA under an Emergency Use Authorization (EUA). This EUA will remain  in effect (meaning this test can be used) for the duration of the  Covid-19 declaration under Section 564(b)(1) of the Act, 21  U.S.C. section 360bbb-3(b)(1), unless the authorization is  terminated or revoked. Performed at [facility], [address] [place], [place] [address]    Today   Subjective    Omar Todd today he is insisting on leaving [place]  -Does not want colonoscopy at this time---refusing further colonoscopy prep - -Does not want further IV antibiotics treatment for his right foot gangrene/infection --Patient left AMA despite persuasion from gastroenterologist, myself, bedside RN and patient's sister Kandace Blitz, at bedside       Patient has been  seen and examined prior to discharge   Objective   Blood pressure 135/66, pulse 83, temperature 98.9 F (37.2 C), temperature source Oral, resp. rate 20, height 6\' 2"  (1.88 m), weight 97.6 kg, SpO2 92 %.   Intake/Output Summary (Last 24 hours) at 03/14/2020 1142 Last data filed at 03/13/2020 1144 Gross per 24 hour  Intake 200 ml  Output --  Net 200 ml    Exam Gen:- Awake Alert,  In no apparent distress  HEENT:- [place].AT, No sclera icterus Neck-Supple Neck,No JVD,.  Lungs-  CTAB , fair symmetrical air movement CV- S1, S2 normal, regular  Abd-  +ve B.Sounds, Abd Soft, No tenderness,    Extremity/Skin:-Right foot gangrene--- erythema ,warmth Psych-affect is appropriate, oriented x3 Neuro-no new focal deficits, no tremors    Data Review   CBC w Diff:  Lab  Results  Component Value Date   WBC 12.6 (H) 03/14/2020   HGB 8.8 (L) 03/14/2020   HGB 7.3 (L) 02/28/2020   HCT 30.7 (L) 03/14/2020   HCT 25.1 (L) 02/28/2020   PLT 411 (H) 03/14/2020   PLT 374 02/28/2020   LYMPHOPCT 14 03/12/2020   MONOPCT 7 03/12/2020   EOSPCT 2 03/12/2020   BASOPCT 0 03/12/2020    CMP:  Lab Results  Component Value Date   NA 134 (L) 03/13/2020   NA 130 (L) 02/28/2020   K 4.4 03/13/2020   CL 98 03/13/2020   CO2 25 03/13/2020   BUN 15 03/13/2020   BUN 13 02/28/2020   CREATININE 0.85 03/13/2020   CREATININE 0.88 04/11/2013   PROT 7.2 02/05/2020   PROT 6.6 08/08/2018   ALBUMIN 3.8 02/05/2020   ALBUMIN 4.4 08/08/2018   BILITOT 0.3 02/05/2020   BILITOT <0.2 08/08/2018   ALKPHOS 65 02/05/2020   AST 13 (L) 02/05/2020   ALT 11 02/05/2020  .   Total Discharge time is about 33 minutes  Roxan Hockey M.D on 03/14/2020 at 11:42 AM  Go to [email or web address] -  for contact info  [facility] - Office  [phone]

## 2020-03-14 NOTE — Discharge Instructions (Signed)
1)Very low-salt diet advised 2)Weigh yourself daily, call if you gain more than 3 pounds in 1 day or more than 5 pounds in 1 week as your diuretic medications may need to be adjusted 3)Limit your Fluid  intake to no more than 60 ounces (1.8 Liters) per day 4)Avoid ibuprofen/Advil/Aleve/Motrin/Goody Powders/Naproxen/BC powders/Meloxicam/Diclofenac/Indomethacin and other Nonsteroidal anti-inflammatory medications as these will make you more likely to bleed and can cause stomach ulcers, can also cause Kidney problems.  5)it is  very important that you follow-up with gastroenterologist Dr. Gala Romney to get your colonoscopy as you may have a tumor in your bowels that may be deadly 6) you need treatment for your right leg poor circulation and infection--- failure to get proper treatment from this will lead to possible amputation and potentially Death

## 2020-03-14 NOTE — Progress Notes (Signed)
0730  Patient found to still to have some [place] in pitcher 0730 am. Soft stool produced around 0700. Informed GI and hospitalist.  0830  patient sister informed nurse, Atilano Ina that the patient dumped the remaining drink down the sink. 0930 Patient requesting to discharge AMA. Educated patient on need for treatment. Notified provider. Elodia Florence RN

## 2020-03-16 ENCOUNTER — Ambulatory Visit (HOSPITAL_COMMUNITY): Admit: 2020-03-16 | Payer: Medicare Other | Admitting: Cardiovascular Disease

## 2020-03-16 ENCOUNTER — Telehealth: Payer: Self-pay | Admitting: *Deleted

## 2020-03-16 ENCOUNTER — Telehealth: Payer: Self-pay | Admitting: Cardiovascular Disease

## 2020-03-16 ENCOUNTER — Encounter (HOSPITAL_COMMUNITY): Payer: Self-pay

## 2020-03-16 ENCOUNTER — Telehealth: Payer: Self-pay

## 2020-03-16 ENCOUNTER — Other Ambulatory Visit: Payer: Self-pay

## 2020-03-16 LAB — SURGICAL PATHOLOGY

## 2020-03-16 SURGERY — ABDOMINAL AORTOGRAM W/LOWER EXTREMITY
Anesthesia: LOCAL | Laterality: Bilateral

## 2020-03-16 NOTE — Telephone Encounter (Signed)
    Transitional Care Management  Contact Attempt Attempt Date:03/16/2020 Attempted By: Frann Rider, LPN   1st unsuccessful TCM contact attempt.   I reached out to Omar Todd on his preferred telephone number to discuss Transitional Care Management, medication reconciliation, and to schedule a TCM hospital follow-up with his PCP at [facility].  Discharge Date: 03/14/2020   Location: [name] ( left hosp. AMA)  Discharge Dx: iron deficiency anemia/ blood loss  Recommendations for Outpatient Follow-up:   (insert from discharge summary) Follow-up Information    [name], [name] Estimable, MD. Schedule an appointment as soon as possible for a visit in 1 week(s).   Specialty: Gastroenterology Contact information: [address] [place] [address] [phone]     Plan I left a HIPPA compliant message for him to return my call.  Will attempt to contact again within the 2 business day post discharge window if he does not return my call.

## 2020-03-16 NOTE — Telephone Encounter (Signed)
Once he has the colonoscopy to determine bleeding source and his HGB is stable we can re schedule PV angio

## 2020-03-16 NOTE — Telephone Encounter (Signed)
Called pt's sister [name]), offered TCS w/SLF tomorrow. She hasn't talked to him since he left the hospital. She said last time she talked to him he was adamant that he didn't want colonoscopy. She will try to contact him and let office know either way what he wants to do.

## 2020-03-16 NOTE — Telephone Encounter (Signed)
-----   Message from Daneil Dolin, MD sent at 03/16/2020  7:27 AM EDT ----- Iron deficiency anemia/Hemoccult positive stool.  No prior colonoscopy.  Data upper in the hospital.  Declined to have a colonoscopy as an outpatient.  Believe [facility] patient.  Needs an urgent colonoscopy.  Please see if you can find a spot on [facility] schedule in the next 1 to 2 weeks

## 2020-03-16 NOTE — Telephone Encounter (Signed)
TRANSITIONAL CARE MANAGEMENT TELEPHONE OUTREACH NOTE   Contact Date: 03/16/2020 Contacted By: Zannie Cove, LPN   DISCHARGE INFORMATION Date of Discharge:03/14/20 ( left AMA)  Discharge Facility: [facility] Discharge Diagnosis:iron deficiency anemia/ blood loss  Outpatient Follow Up Recommendations (copied from discharge summary) Follow-up Information    Omar Todd, Omar Estimable, MD. Schedule an appointment as soon as possible for a visit in 1 week(s).   Specialty: Gastroenterology Contact information: [place] [address] [phone]     Omar Todd is a male primary care patient of [name], DO. An outgoing telephone call was made today and I spoke with Omar Todd, sister.  Mr. Omar Todd condition(s) and treatment(s) were discussed. An opportunity to ask questions was provided and all were answered or forwarded as appropriate.    ACTIVITIES OF DAILY LIVING  STROTHER SIMMONDS lives alone and he can perform ADLs independently. his primary caregiver is himself and his sister. he is able to depend on his primary caregiver(s) for consistent help. Transportation to appointments, to pick up medications, and to run errands is a problem.  (Consider referral to [facility] if transportation or a consistent caregiver is a problem)   Fall Risk Fall Risk  02/04/2020 01/29/2020  Falls in the past year? 1 0  Number falls in past yr: 0 0  Injury with Fall? 1 0  Comment Injured right foot -  Risk for fall due to : Other (Comment) No Fall Risks  Follow up Education provided Falls evaluation completed    medium Omar Todd Modifications/Assistive Devices Wheelchair: No Cane: No Ramp: No Bedside Toilet: No Hospital Bed:  No Other:    [place] he is not receiving home health n/a services.     MEDICATION RECONCILIATION  Mr. Omar Todd has been able to pick-up all prescribed discharge medications from the pharmacy.   A post discharge  medication reconciliation was performed and the complete medication list was reviewed with the patient/caregiver and is current as of 03/16/2020. Changes highlighted below.  Discontinued Medications STOP taking these medications   Clenpiq 10-3.5-12 MG-GM -GM/160ML Soln Generic drug: Sod Picosulfate-Mag Ox-Cit Acd   diclofenac 75 MG EC tablet Commonly known as: VOLTAREN   meloxicam 7.5 MG tablet Commonly known as: MOBIC   omeprazole 20 MG capsule Commonly known as: PRILOSEC     Current Medication List Allergies as of 03/16/2020      Reactions   Penicillins Other (See Comments)   Pt. States he "passed out"      Medication List       Accurate as of March 16, 2020  9:08 AM. If you have any questions, ask your nurse or doctor.        amLODipine 10 MG tablet Commonly known as: NORVASC Take 1 tablet (10 mg total) by mouth daily.   aspirin EC 81 MG tablet Take 1 tablet (81 mg total) by mouth daily with breakfast.   BIOTIN 5000 PO Take 1 capsule by mouth daily.   cefdinir 300 MG capsule Commonly known as: OMNICEF Take 1 capsule (300 mg total) by mouth 2 (two) times daily for 7 days.   cholecalciferol 1000 units tablet Commonly known as: VITAMIN D Take 1,000 Units by mouth daily.   escitalopram 10 MG tablet Commonly known as: LEXAPRO Take 1 tablet (10 mg total) by mouth daily.   ferrous sulfate 325 (65 FE) MG tablet Take 1 tablet (325 mg total) by mouth daily with breakfast.  hydrochlorothiazide 25 MG tablet Commonly known as: HYDRODIURIL Take 1 tablet (25 mg total) by mouth daily.   Jardiance 25 MG Tabs tablet Generic drug: empagliflozin Take 25 mg by mouth daily before breakfast.   lisinopril 40 MG tablet Commonly known as: ZESTRIL TAKE ONE (1) TABLET EACH DAY   metFORMIN 1000 MG tablet Commonly known as: GLUCOPHAGE Take 1 tablet (1,000 mg total) by mouth 2 (two) times daily with a meal.   metoprolol tartrate 25 MG tablet Commonly known as:  LOPRESSOR Take 1 tablet (25 mg total) by mouth 2 (two) times daily.   metroNIDAZOLE 500 MG tablet Commonly known as: Flagyl Take 1 tablet (500 mg total) by mouth 3 (three) times daily for 7 days.   montelukast 10 MG tablet Commonly known as: SINGULAIR TAKE ONE TABLET DAILY AT BEDTIME   mupirocin cream 2 % Commonly known as: BACTROBAN Apply 1 application topically 2 (two) times daily.   pantoprazole 40 MG tablet Commonly known as: PROTONIX Take 1 tablet (40 mg total) by mouth daily.   pravastatin 40 MG tablet Commonly known as: PRAVACHOL Take 1 tablet (40 mg total) by mouth every evening.   Santyl ointment Generic drug: collagenase Apply 1 application topically daily. Ulcers to right 4th sulcus, right 1st toe and right heel measurements sent on [facility] form. What changed: when to take this   [name] 100 MG tablet Commonly known as: Januvia Take 1 tablet (100 mg total) by mouth daily.        PATIENT EDUCATION & FOLLOW-UP PLAN  An appointment for Transitional Care Management is scheduled with [name], DO on 03/19/2020 at 1030am.  Take all medications as prescribed  Contact our office by calling [phone] if you have any questions or concerns

## 2020-03-16 NOTE — Telephone Encounter (Signed)
Returned the call to the sister, per the dpr. The patient was due to have a pv angiogram but was cancelled due to low hemoglobin. The patient was admitted to the hospital where he received a blood transfusion. The patient did the endooscopy but has refused the colonoscopy. The sister called to let Dr. Gwenlyn Found know the update.  ----- Message from Daneil Dolin, MD sent at 03/16/2020  7:27 AM EDT ----- Iron deficiency anemia/Hemoccult positive stool.  No prior colonoscopy.  Data upper in the hospital.  Declined to have a colonoscopy as an outpatient.  Believe [facility] patient.  Needs an urgent colonoscopy.  Please see if you can find a spot on [facility] schedule in the next 1 to 2 weeks

## 2020-03-16 NOTE — Telephone Encounter (Signed)
  Sister would like for Dr Gwenlyn Found know that the patient was in hospital and they wanted to do a colonoscopy but patient refused. She would like to see if Dr Gwenlyn Found could talk to the patient to see if he could get him to do the colonoscopy.

## 2020-03-17 ENCOUNTER — Encounter: Payer: Self-pay | Admitting: Internal Medicine

## 2020-03-17 ENCOUNTER — Ambulatory Visit (HOSPITAL_COMMUNITY): Admit: 2020-03-17 | Payer: Medicare Other | Admitting: Gastroenterology

## 2020-03-17 ENCOUNTER — Encounter (HOSPITAL_COMMUNITY): Payer: Self-pay

## 2020-03-17 SURGERY — COLONOSCOPY WITH PROPOFOL
Anesthesia: Monitor Anesthesia Care

## 2020-03-18 ENCOUNTER — Encounter: Payer: Self-pay | Admitting: Family Medicine

## 2020-03-19 ENCOUNTER — Encounter: Payer: Self-pay | Admitting: Podiatry

## 2020-03-19 ENCOUNTER — Encounter: Payer: Self-pay | Admitting: Nurse Practitioner

## 2020-03-19 ENCOUNTER — Other Ambulatory Visit: Payer: Self-pay | Admitting: Family Medicine

## 2020-03-19 ENCOUNTER — Ambulatory Visit: Payer: Medicare Other | Admitting: *Deleted

## 2020-03-19 ENCOUNTER — Ambulatory Visit: Payer: Medicare Other | Admitting: Family Medicine

## 2020-03-19 ENCOUNTER — Other Ambulatory Visit (HOSPITAL_COMMUNITY): Payer: Medicare Other

## 2020-03-19 ENCOUNTER — Encounter: Payer: Self-pay | Admitting: Family Medicine

## 2020-03-19 ENCOUNTER — Encounter (HOSPITAL_COMMUNITY): Payer: Self-pay

## 2020-03-19 NOTE — Progress Notes (Signed)
I spoke to [facility] nurse, Chong Sicilian, about patient.  I am concerned for capacity, which I had planned to evaluate patient for today but he did not come to his appointment.  His sister has reached out to me with concerns for mental capacity.  He left AMA from hospital.  There were plans to evaluate for GI bleed, which he told his neighbor, he now is also having urinary bleeding.  Not sure if APS could go by and evaluate for capacity/ safety.  I worry about this patient's current state of health.  She will work on seeing how we can get someone to check on him ASAP.

## 2020-03-19 NOTE — Chronic Care Management (AMB) (Signed)
   Care Management   Care Coordination Note  03/19/2020 Name: DARLENE BONE MRN: [id number] DOB: 1951-09-20  I was consulted by Dr Lajuana Ripple because Mr Hallenbeck's family is concerned that he is not thinking clearly and that he is not making medical decisions that are in his best interest. He left the hospital AMA on 03/17/20 and no showed for a f/u appt with Dr Lajuana Ripple today despite being reminded of the appointment this morning. His sister feels that his physical and mental condition has worsened over the past week and that he is confused and that is affecting his ability to make good decisions. He is declining assistance from them even though he's told them that he's still having rectal bleeding and has started urinating blood.  I reached out to [facility] who agreed to send someone out to do an evaluation of patient's mental capacity and ability to make critical health care decisions.   I spoke with patient's sister, [name], by telephone. She stated that another sister was going to check in on him later today and that she will continue to help him as well. Advised to call 911 if unable to reach patient or get him to the door.   Follow up plan: The care management team will reach out to the patient again over the next 3 days.   Chong Sicilian, BSN, RN-BC Embedded Chronic Care Manager [facility] Management Direct Dial: [phone]

## 2020-03-20 ENCOUNTER — Other Ambulatory Visit: Payer: Self-pay | Admitting: *Deleted

## 2020-03-20 ENCOUNTER — Ambulatory Visit: Payer: Medicare Other | Admitting: *Deleted

## 2020-03-20 DIAGNOSIS — L97518 Non-pressure chronic ulcer of other part of right foot with other specified severity: Secondary | ICD-10-CM

## 2020-03-20 DIAGNOSIS — E11621 Type 2 diabetes mellitus with foot ulcer: Secondary | ICD-10-CM

## 2020-03-20 DIAGNOSIS — E1165 Type 2 diabetes mellitus with hyperglycemia: Secondary | ICD-10-CM

## 2020-03-20 DIAGNOSIS — K922 Gastrointestinal hemorrhage, unspecified: Secondary | ICD-10-CM

## 2020-03-20 DIAGNOSIS — L089 Local infection of the skin and subcutaneous tissue, unspecified: Secondary | ICD-10-CM

## 2020-03-20 DIAGNOSIS — L97418 Non-pressure chronic ulcer of right heel and midfoot with other specified severity: Secondary | ICD-10-CM

## 2020-03-20 DIAGNOSIS — D649 Anemia, unspecified: Secondary | ICD-10-CM

## 2020-03-23 ENCOUNTER — Ambulatory Visit (HOSPITAL_COMMUNITY): Payer: Medicare Other | Attending: Cardiology

## 2020-03-23 ENCOUNTER — Telehealth: Payer: Self-pay | Admitting: Podiatry

## 2020-03-23 ENCOUNTER — Encounter: Payer: Self-pay | Admitting: Podiatry

## 2020-03-23 ENCOUNTER — Ambulatory Visit: Payer: Medicare Other | Admitting: *Deleted

## 2020-03-23 DIAGNOSIS — L97518 Non-pressure chronic ulcer of other part of right foot with other specified severity: Secondary | ICD-10-CM

## 2020-03-23 DIAGNOSIS — E11621 Type 2 diabetes mellitus with foot ulcer: Secondary | ICD-10-CM

## 2020-03-23 DIAGNOSIS — E1165 Type 2 diabetes mellitus with hyperglycemia: Secondary | ICD-10-CM

## 2020-03-23 NOTE — Chronic Care Management (AMB) (Signed)
  Care Management   Care Coordination Note  03/20/20 Name: Omar Todd MRN: [id number] DOB: 08-12-1951  [name] was referred to the CCM to address concerns related to his safety and ability to make sound medical decisions on his own.   I spoke with Morey Hummingbird with APS after she went out to [name]'s home. She was able to do an evaluation and she felt that he did have the mental capacity to make his own decisions. She also thought that his sister(s) should be more involved and should be assisting [name] to appointments. She was not overly concerned about rectal bleeding. States that [name] was wearing only underwear for most of the visit and there was no obvious bleeding or large amount of staining on underpants. She was however concerned about his toes on his right foot and stated that they were "rotting off" and also a wound on his heel. [name] confirmed to her that he was given antibiotics for those at hospital discharge and that he has those in his possession.   I reached out to [name]'s sister, [name] and confirmed that the antibiotics were picked up. She isn't sure if he is taking them as prescribed. She and another sister will check in on him.   I reached out to [name] but had to leave a message on his voicemail. He needs to reschedule his hospital f/u appt with PCP. I would also like to check on the status of rectal bleeding and make sure he is taking his antibiotics as prescribed.   Below is the image of [name]'s foot that Morey Hummingbird forwarded me.     Follow up plan: The care management team will reach out to the patient again over the next 5 days.   Chong Sicilian, BSN, RN-BC Embedded Chronic Care Manager [facility] Management Direct Dial: [phone]

## 2020-03-23 NOTE — Telephone Encounter (Signed)
FYI to EG and RMR.

## 2020-03-23 NOTE — Telephone Encounter (Signed)
Noted  

## 2020-03-23 NOTE — Chronic Care Management (AMB) (Signed)
  Care Management   Care Coordination Note  03/23/2020 Name: Omar Todd MRN: [id number] DOB: 1951-04-15  I reached out to Omar Todd again today by telephone but had to leave a message requesting a return call. Chart review shows that his sister, Omar Todd, has been in touch with his cardiologist and podiatrist today. He is being scheduled with podiatry for later this week but compliance will be an issue. His sister's message states that he isn't taking calls from doctor's right now, so that's likely why he hasn't been answering my calls.   Omar Mujica needs to follow-up with podiatry, GI, and PCP and he needs to be taking the antibiotics for his toes. I have not been able to confirm that he is taking those.   Follow up plan: The care management team will reach out to the patient again over the next 1 days.   Chong Sicilian, BSN, RN-BC Embedded Chronic Care Manager [facility] Management Direct Dial: [phone]

## 2020-03-23 NOTE — Telephone Encounter (Signed)
Called and spoke to patient's sister [name]. I advised that I tried calling him Thursday in response to her last message. She mentioned [facility] is not answering the phone for any doctors. She is concerned he is not understanding the severity of his issues and doesn't trust doctors at the moment. I advised that I would be happy to see him this week. Unfortuantely I am not in the office in [place] until Thursday. I stated that I could offer a sooner appt with another provider however we both agreed this may not be best for him to introduce another provider into the mix - and per his sister patient seems to only trust me. I advised should issues worsen during that time he needs to see another provider or needs to go to the ED. [name] verbalized understanding  Will see him 1130 Thursday and discuss his vascular and related GI issues.

## 2020-03-23 NOTE — Telephone Encounter (Signed)
Communication noted.  

## 2020-03-24 ENCOUNTER — Telehealth: Payer: Self-pay | Admitting: Podiatry

## 2020-03-24 ENCOUNTER — Ambulatory Visit: Payer: Medicare Other | Admitting: Gastroenterology

## 2020-03-24 NOTE — Telephone Encounter (Signed)
Ive called pt and sister unable to contact or lvm trying to get pt on the schedule. 03/23/20

## 2020-03-25 ENCOUNTER — Encounter: Payer: Self-pay | Admitting: Podiatry

## 2020-03-25 NOTE — Telephone Encounter (Signed)
Left message on pt's home and mobile, that I had reviewed his clinicals and Dr. March Rummage would like him to make a follow up appt to evaluate his feet.

## 2020-03-26 ENCOUNTER — Encounter: Payer: Self-pay | Admitting: Nurse Practitioner

## 2020-03-26 ENCOUNTER — Ambulatory Visit (INDEPENDENT_AMBULATORY_CARE_PROVIDER_SITE_OTHER): Payer: Medicare Other | Admitting: Podiatry

## 2020-03-26 ENCOUNTER — Encounter: Payer: Self-pay | Admitting: Podiatry

## 2020-03-26 ENCOUNTER — Other Ambulatory Visit: Payer: Self-pay

## 2020-03-26 VITALS — Temp 97.2°F

## 2020-03-26 DIAGNOSIS — I96 Gangrene, not elsewhere classified: Secondary | ICD-10-CM

## 2020-03-26 DIAGNOSIS — L97411 Non-pressure chronic ulcer of right heel and midfoot limited to breakdown of skin: Secondary | ICD-10-CM

## 2020-03-26 DIAGNOSIS — L97412 Non-pressure chronic ulcer of right heel and midfoot with fat layer exposed: Secondary | ICD-10-CM

## 2020-03-26 DIAGNOSIS — I70229 Atherosclerosis of native arteries of extremities with rest pain, unspecified extremity: Secondary | ICD-10-CM

## 2020-03-26 DIAGNOSIS — I998 Other disorder of circulatory system: Secondary | ICD-10-CM

## 2020-03-26 DIAGNOSIS — I739 Peripheral vascular disease, unspecified: Secondary | ICD-10-CM | POA: Insufficient documentation

## 2020-03-26 DIAGNOSIS — L97512 Non-pressure chronic ulcer of other part of right foot with fat layer exposed: Secondary | ICD-10-CM

## 2020-03-26 NOTE — Progress Notes (Signed)
  Subjective:  [name], male    DOB: [birth year],  MRN: [id number]  Chief Complaint  Patient presents with  . Diabetic Ulcer    R foot - multiple sites (heel, hallux, toes 3-5). Pt stated, "No pain. Draining pus. Ran out of Santyl and PO antibiotics".   69 y.o. male presents for wound care. Hx confirmed with patient. Did not get GI procedure.  Objective:  Physical Exam: Wound Location: Right heel Wound Measurement: 1.5x1 Wound Base: Necrotic eschar Peri-wound: reddened Exudate: None: wound tissue dry appears to have worsened since last recheck No purulence or signs of acute infection today  Ischemic 3rd/4th/5th toes right, distal hallux. Pallor right foot up to mid shin.  No images are attached to the encounter.   Assessment:   1. Ulcer of great toe, right, with fat layer exposed ([place])   2. Ulcer of heel, right, with fat layer exposed ([place])   3. Ulcer of right midfoot limited to breakdown of skin ([place])   4. PAD (peripheral artery disease) ([place])   5. Gangrene ([place])   6. Critical lower limb ischemia      Plan:  Patient was evaluated and treated and all questions answered.  Gangrene R Foot, CLI  -No purulence or signs of acute infection today despite patient noting purulence -Order HHC for betadine WTD dressings to all gangrenous areas at present, resume Santyl to heel and to hallux and betadine to ischemic areas of toes when patient refills it. -Discussed importance of sooner f/u with GI so we can figure out the cause of bleeding that is delaying vascular intervention -Discussed at present we can hope for a transmetatarsal amputation assuming adequate perfusion. I am concerned he may end up in a below knee amputation.  No follow-ups on file.

## 2020-03-27 ENCOUNTER — Ambulatory Visit: Payer: Medicare Other | Admitting: *Deleted

## 2020-03-27 ENCOUNTER — Telehealth: Payer: Self-pay | Admitting: *Deleted

## 2020-03-27 DIAGNOSIS — K922 Gastrointestinal hemorrhage, unspecified: Secondary | ICD-10-CM

## 2020-03-27 DIAGNOSIS — I739 Peripheral vascular disease, unspecified: Secondary | ICD-10-CM

## 2020-03-27 DIAGNOSIS — I96 Gangrene, not elsewhere classified: Secondary | ICD-10-CM

## 2020-03-27 MED ORDER — SANTYL 250 UNIT/GM EX OINT
1.0000 | TOPICAL_OINTMENT | Freq: Every day | CUTANEOUS | 5 refills | Status: DC
Start: 2020-03-27 — End: 2020-05-20

## 2020-03-27 NOTE — Chronic Care Management (AMB) (Signed)
  Care Management   Care Coordination Note  03/27/2020 Name: Omar Todd MRN: [id number] DOB: 19-Aug-1951  [name] was referred for assistance with care coordination. He has refused Chronic Care Management services. He has support from his two sisters. Omar Todd is currently out of town but she is communicating with his providers via My Chart and helping to arrange his appointments. He is scheduled with GI at the end of the month but they are trying to move that appointment up so that they can move forward with surgery on his foot. His other sister is helping him get to appointments.   [name] has been evaluated by APS and determined to have full mental capacity and ability to make his own decisions and he has support from his two sisters. CCM follow-up not needed at this time.   Follow up plan: [name] will keep all scheduled medical appointments No CCM follow-up scheduled Previously provided Omar Todd with RN Care Manager contact number and encouraged to reach out as needed CCM team will remain available to provide services if patient desires  Chong Sicilian, BSN, RN-BC [facility] Management Direct Dial: [phone]

## 2020-03-27 NOTE — Telephone Encounter (Signed)
[facility] sent note stating they can not accept pt due to lack of staff.

## 2020-03-27 NOTE — Telephone Encounter (Signed)
Faxed required form, clinicals and demographics to [facility]. 

## 2020-03-27 NOTE — Telephone Encounter (Signed)
Faxed required form, clinicals and demographics to [facility]. Faxed required form, clinicals and demographics to [facility] [phone].

## 2020-03-27 NOTE — Telephone Encounter (Signed)
-----   Message from Evelina Bucy, DPM sent at 03/26/2020 11:42 AM EDT ----- Can we initiate [place]? See note for instructions

## 2020-03-31 ENCOUNTER — Ambulatory Visit: Payer: Medicare Other | Admitting: Cardiovascular Disease

## 2020-04-08 ENCOUNTER — Encounter: Payer: Self-pay | Admitting: Podiatry

## 2020-04-09 ENCOUNTER — Telehealth: Payer: Self-pay | Admitting: *Deleted

## 2020-04-09 ENCOUNTER — Encounter: Payer: Self-pay | Admitting: Podiatry

## 2020-04-09 ENCOUNTER — Other Ambulatory Visit: Payer: Self-pay

## 2020-04-09 ENCOUNTER — Ambulatory Visit (INDEPENDENT_AMBULATORY_CARE_PROVIDER_SITE_OTHER): Payer: Medicare Other | Admitting: Podiatry

## 2020-04-09 VITALS — Temp 99.5°F

## 2020-04-09 DIAGNOSIS — I96 Gangrene, not elsewhere classified: Secondary | ICD-10-CM

## 2020-04-09 DIAGNOSIS — L97512 Non-pressure chronic ulcer of other part of right foot with fat layer exposed: Secondary | ICD-10-CM

## 2020-04-09 DIAGNOSIS — I739 Peripheral vascular disease, unspecified: Secondary | ICD-10-CM

## 2020-04-09 DIAGNOSIS — L97412 Non-pressure chronic ulcer of right heel and midfoot with fat layer exposed: Secondary | ICD-10-CM

## 2020-04-09 DIAGNOSIS — I70229 Atherosclerosis of native arteries of extremities with rest pain, unspecified extremity: Secondary | ICD-10-CM

## 2020-04-09 DIAGNOSIS — L97411 Non-pressure chronic ulcer of right heel and midfoot limited to breakdown of skin: Secondary | ICD-10-CM

## 2020-04-09 MED ORDER — SANTYL 250 UNIT/GM EX OINT: 1.0000 "application " | TOPICAL_OINTMENT | Freq: Every day | CUTANEOUS | 5 refills | Status: DC

## 2020-04-09 NOTE — Telephone Encounter (Signed)
Dr. March Rummage ordered refill of the santyl and crutches. Orders for Santyl faxed to [facility] and [facility].

## 2020-04-10 ENCOUNTER — Telehealth: Payer: Self-pay | Admitting: *Deleted

## 2020-04-10 NOTE — Telephone Encounter (Signed)
Faxed rx for crutches and demographics to [facility].

## 2020-04-10 NOTE — Telephone Encounter (Signed)
Omar Todd with [employer] [phone] requesting diagnosis code for insurance benefits to bill for crutches asap. The patient will be coming in this weekend to pick up. Please contact. If she is not available may speak with Tammy.

## 2020-04-10 NOTE — Telephone Encounter (Signed)
I spoke with [place] and informed of [phone].

## 2020-04-15 ENCOUNTER — Telehealth: Payer: Self-pay | Admitting: *Deleted

## 2020-04-15 NOTE — Telephone Encounter (Signed)
Jackquline Bosch, you are scheduled for a virtual visit with your provider today.  Just as we do with appointments in the office, we must obtain your consent to participate.  Your consent will be active for this visit and any virtual visit you may have with one of our providers in the next 365 days.  If you have a MyChart account, I can also send a copy of this consent to you electronically.  All virtual visits are billed to your insurance company just like a traditional visit in the office.  As this is a virtual visit, video technology does not allow for your provider to perform a traditional examination.  This may limit your provider's ability to fully assess your condition.  If your provider identifies any concerns that need to be evaluated in person or the need to arrange testing such as labs, EKG, etc, we will make arrangements to do so.  Although advances in technology are sophisticated, we cannot ensure that it will always work on either your end or our end.  If the connection with a video visit is poor, we may have to switch to a telephone visit.  With either a video or telephone visit, we are not always able to ensure that we have a secure connection.   I need to obtain your verbal consent now.   Are you willing to proceed with your visit today?

## 2020-04-15 NOTE — Telephone Encounter (Signed)
Pt consented to telephone visit. 

## 2020-04-16 ENCOUNTER — Encounter: Payer: Self-pay | Admitting: Nurse Practitioner

## 2020-04-16 ENCOUNTER — Telehealth: Payer: Self-pay

## 2020-04-16 ENCOUNTER — Encounter: Payer: Self-pay | Admitting: Family Medicine

## 2020-04-16 ENCOUNTER — Other Ambulatory Visit: Payer: Self-pay

## 2020-04-16 ENCOUNTER — Ambulatory Visit (INDEPENDENT_AMBULATORY_CARE_PROVIDER_SITE_OTHER): Payer: Medicare Other | Admitting: Nurse Practitioner

## 2020-04-16 DIAGNOSIS — D649 Anemia, unspecified: Secondary | ICD-10-CM | POA: Diagnosis not present

## 2020-04-16 DIAGNOSIS — K922 Gastrointestinal hemorrhage, unspecified: Secondary | ICD-10-CM | POA: Diagnosis not present

## 2020-04-16 NOTE — Progress Notes (Signed)
Referring Provider: [name], DO Primary Care Physician:  [name], DO Primary GI:  Dr. Gala Romney (in the absence of Dr. Oneida Alar); pending Dr. Abbey Chatters  NOTE: Service was provided via telemedicine and was requested by the patient due to COVID-19 pandemic.  Patient Location: Home  Provider Location: [place] office  Reason for Phone Visit: follow-up  Persons present on the phone encounter, with roles: Patient, myself (provider), Christ Kick (updated meds and allergies)  Total time (minutes) spent on medical discussion: 22 minutes  Due to COVID-19, visit was conducted using the virtual method noted. Visit was requested by patient.  I connected with Omar Todd on 04/16/20 at  2:30 PM EDT by telephone and verified that I am speaking with the correct person using two identifiers.   I discussed the limitations, risks, security and privacy concerns of performing an evaluation and management service by telephone and the availability of in person appointments. I also discussed with the patient that there may be a patient responsible charge related to this service. The patient expressed understanding and agreed to proceed.  Chief Complaint  Patient presents with  . Anemia    HPI:   Omar Todd is a 69 y.o. male who presents for follow-up on anemia.  The patient was last seen in our office 03/12/2020 for anemia.  His last visit was a referral from primary care.  Previously scheduled aortogram with bilateral lower extremities was canceled due to hemoglobin of 7.3.  His CBC was microcytic and hypochromic.  1 month prior hemoglobin 8.8 and fecal occult blood testing was negative.  At his last visit he noted a foot wound but does not really bleed but is nonhealing and likely due to poor circulation.  Colonoscopy sometime in the past 5 years in [place], [place], although cannot confirm this.  Initially said it was then [place], [place] but then said it was in  [place].  No endoscopic records were found in our system.  No overt GI complaints.  On ibuprofen twice daily as well as aspirin 81 mg as well as Voltaren and Mobic.  No GERD symptoms.  Takes aspirin powders as needed for headache about once a week, on Prilosec 20 mg daily.  Recommended stop Prilosec and start Protonix 40 mg twice daily, strict avoidance of all NSAIDs and aspirin powders, updated CBC, follow-up in 4 weeks.  CBC was completed 03/12/2020 which further declined to 6.5.  The patient was urgently referred to the emergency department.  The patient was admitted to the hospital from 03/12/2020 through 03/14/2020 for GI bleed, IDA.  In the ED his creatinine was 1.06, ferritin 9, iron 14, platelets normal at 413.  Fecal occult blood positive, SARS-CoV-2 negative, flu PCR negative.  Foot wound with gangrene.  EGD was completed and unrevealing.  There were plans for colonoscopy on 03/14/2020 but the patient had poor colon prep due to not drinking the prep medication and left AMA.  EGD completed 03/13/2020 which found normal esophagus, abnormal gastric mucosa status post biopsy query NSAID effect versus H. pylori.  Significant NSAID injury to the more distal small bowel not excluded nor is occult right-sided colon lesion.  Recommended colonoscopy.  He was scheduled for 24 2021 for an inpatient colonoscopy.  He declined to have this done as an inpatient and stated he needed to take care of some things at home and would return as an outpatient.  Nursing staff reported he did not get his second round of GoLYTELY,  had formed stool in the morning.  Hemoglobin was 8.8 on day of discharge.  Today he states he's doing ok overall. Denies abdominal pain, N/V, hematochezia, melena, fever, chills, unintentional weight loss. Energy level is "pretty good." Can't walk well with his foot wound. Denies weakness. Has not had any labs since d/c. He notes that his toe is covered in "black stuff" that bleeds at night. Not a lot  of blood, though. Denies URI or flu-like symptoms. Denies loss of sense of taste or smell. The patient has not received COVID-19 vaccination(s). Denies chest pain, dyspnea, dizziness, lightheadedness, syncope, near syncope. Denies any other upper or lower GI symptoms.  Past Medical History:  Diagnosis Date  . COPD (chronic obstructive pulmonary disease) ([place])   . Diabetes mellitus without complication ([place])   . Gangrene of toe of right foot ([place])   . GERD (gastroesophageal reflux disease)   . Hyperlipidemia   . Hypertension     Past Surgical History:  Procedure Laterality Date  . BIOPSY  03/13/2020   Procedure: BIOPSY;  Surgeon: Daneil Dolin, MD;  [facility];  Service: Endoscopy;;  gastric biopsy  . ESOPHAGOGASTRODUODENOSCOPY N/A 03/13/2020   Procedure: ESOPHAGOGASTRODUODENOSCOPY (EGD);  Surgeon: Daneil Dolin, MD;  Location: [facility];  Service: Endoscopy;  Laterality: N/A;  . Thumb surgery      Current Outpatient Medications  Medication Sig Dispense Refill  . ACCU-CHEK GUIDE test strip     . amLODipine (NORVASC) 10 MG tablet Take 1 tablet (10 mg total) by mouth daily. 30 tablet 3  . aspirin EC 81 MG tablet Take 1 tablet (81 mg total) by mouth daily with breakfast. 30 tablet 3  . BIOTIN 5000 PO Take 1 capsule by mouth daily.    . cholecalciferol (VITAMIN D) 1000 UNITS tablet Take 1,000 Units by mouth daily.    . collagenase (SANTYL) ointment Apply 1 application topically daily. Wound Measurements:  Right heel - 1.5 x 0.1cm 30 g 5  . collagenase (SANTYL) ointment Apply 1 application topically daily. Ulcers to right 1st toe 1.0 x 1.0cm and right heel 1.5 x 1.0cm measurements sent on [place] form. 30 g 5  . empagliflozin (JARDIANCE) 25 MG TABS tablet Take 25 mg by mouth daily before breakfast. 30 tablet 3  . escitalopram (LEXAPRO) 10 MG tablet Take 1 tablet (10 mg total) by mouth daily. 30 tablet 3  . ferrous sulfate 325 (65 FE) MG tablet Take 1  tablet (325 mg total) by mouth daily with breakfast. 30 tablet 1  . hydrochlorothiazide (HYDRODIURIL) 25 MG tablet Take 1 tablet (25 mg total) by mouth daily. 30 tablet 3  . lisinopril (ZESTRIL) 40 MG tablet TAKE ONE (1) TABLET EACH DAY 30 tablet 3  . metFORMIN (GLUCOPHAGE) 1000 MG tablet Take 1 tablet (1,000 mg total) by mouth 2 (two) times daily with a meal. 60 tablet 3  . metoprolol tartrate (LOPRESSOR) 25 MG tablet Take 1 tablet (25 mg total) by mouth 2 (two) times daily. 60 tablet 3  . montelukast (SINGULAIR) 10 MG tablet TAKE ONE TABLET DAILY AT BEDTIME (Patient taking differently: Take 10 mg by mouth at bedtime. ) 90 tablet 1  . mupirocin cream (BACTROBAN) 2 % Apply 1 application topically 2 (two) times daily. 15 g 0  . pantoprazole (PROTONIX) 40 MG tablet Take 1 tablet (40 mg total) by mouth daily. 30 tablet 2  . pravastatin (PRAVACHOL) 40 MG tablet Take 1 tablet (40 mg total) by mouth every  evening. 30 tablet 5  . [name] (JANUVIA) 100 MG tablet Take 1 tablet (100 mg total) by mouth daily. 30 tablet 4   No current facility-administered medications for this visit.    Allergies as of 04/16/2020 - Review Complete 04/16/2020  Allergen Reaction Noted  . Penicillins Other (See Comments) 04/11/2013    Family History  Problem Relation Age of Onset  . Cancer Father   . Diabetes Sister   . Stroke Brother   . Healthy Sister   . Healthy Sister   . Colon cancer Neg Hx   . Gastric cancer Neg Hx     Social History   Socioeconomic History  . Marital status: Married    Spouse name: [name]  . Number of children: 3  . Years of education: 9th Grade  . Highest education level: 9th grade  Occupational History  . Occupation: Retired    Comment: Science writer  Tobacco Use  . Smoking status: Current Every Day Smoker    Packs/day: 0.50    Years: 60.00    Pack years: 30.00    Types: Cigarettes  . Smokeless tobacco: Never Used  Substance and Sexual Activity  . Alcohol  use: No    Alcohol/week: 0.0 standard drinks  . Drug use: No  . Sexual activity: Yes    Birth control/protection: None  Other Topics Concern  . Not on file  Social History Narrative  . Not on file   Social Determinants of Health   Financial Resource Strain: Low Risk   . Difficulty of Paying Living Expenses: Not very hard  Food Insecurity: No Food Insecurity  . Worried About Charity fundraiser in the Last Year: Never true  . Ran Out of Food in the Last Year: Never true  Transportation Needs: No Transportation Needs  . Lack of Transportation (Medical): No  . Lack of Transportation (Non-Medical): No  Physical Activity: Inactive  . Days of Exercise per Week: 0 days  . Minutes of Exercise per Session: 0 min  Stress: No Stress Concern Present  . Feeling of Stress : Only a little  Social Connections: Not Isolated  . Frequency of Communication with Friends and Family: More than three times a week  . Frequency of Social Gatherings with Friends and Family: More than three times a week  . Attends Religious Services: More than 4 times per year  . Active Member of Clubs or Organizations: Yes  . Attends Archivist Meetings: More than 4 times per year  . Marital Status: Married    Review of Systems: Review of Systems  Constitutional: Negative for chills, fever, malaise/fatigue and weight loss.  HENT: Negative for congestion and sore throat.   Respiratory: Negative for cough and shortness of breath.   Cardiovascular: Negative for chest pain and palpitations.  Gastrointestinal: Negative for abdominal pain, blood in stool, diarrhea, melena, nausea and vomiting.  Musculoskeletal: Negative for joint pain and myalgias.  Skin: Negative for rash.  Neurological: Negative for dizziness and weakness.  Endo/Heme/Allergies: Does not bruise/bleed easily (bleeding from foot wound at night).  Psychiatric/Behavioral: Negative for depression. The patient is not nervous/anxious.   All other  systems reviewed and are negative.   Physical Exam: Note: limited exam due to virtual visit There were no vitals taken for this visit. Physical Exam Nursing note reviewed.  Constitutional:      General: He is not in acute distress.    Comments: No acute distress noted on telephone  HENT:  Nose: No congestion.     Comments: No obvious congestion noted on telephone Pulmonary:     Effort: No respiratory distress.     Comments: Conversational with no obvious distress heard on telephone Neurological:     General: No focal deficit present.     Mental Status: He is alert and oriented to person, place, and time. Mental status is at baseline.  Psychiatric:        Mood and Affect: Mood normal.        Behavior: Behavior normal.        Thought Content: Thought content normal.

## 2020-04-16 NOTE — Assessment & Plan Note (Signed)
Cardiology evaluation with lower extremity intervention was postponed due to hemoglobin of 7.8.  Recheck in our office at his last office visit found hemoglobin declined to 6.5 and he was referred to the ED.  EGD unrevealing as per above.  He essentially left AMA before his colonoscopy can be completed.  His hemoglobin on discharge date was 8.8.  At this point I will recheck a stat CBC, iron, ferritin.  He states he does feel like his hemoglobin may be a bit low.  Denies overt anemia symptoms otherwise.  Denies hematochezia or melena.  Further recommendations to follow his lab results.  Colonoscopy as per above.  Follow-up in 2 months.

## 2020-04-16 NOTE — Patient Instructions (Signed)
Your health issues we discussed today were:   GI bleed and anemia: 1. Have your labs checked today, or tomorrow at the latest 2. We will schedule your colonoscopy for you 3. Further recommendations will be made after we get your lab results and your colonoscopy 4. Call us if you have any obvious bleeding in your stools or jet black stools 5. Call us for any worsening fatigue, weakness, dizziness, lightheadedness, passing out, nearly passing out, chest pain, shortness of breath 6. Call us for any other GI concerns  Overall I recommend:  1. Continue your other current medications 2. Return for follow-up in 2 months 3. Call us if you have any questions or concerns   At [facility] we value your feedback. You may receive a survey about your visit today. Please share your experience as we strive to create trusting relationships with our patients to provide genuine, compassionate, quality care.  We appreciate your understanding and patience as we review any laboratory studies, imaging, and other diagnostic tests that are ordered as we care for you. Our office policy is 5 business days for review of these results, and any emergent or urgent results are addressed in a timely manner for your best interest. If you do not hear from our office in 1 week, please contact us.   We also encourage the use of MyChart, which contains your medical information for your review as well. If you are not enrolled in this feature, an access code is on this after visit summary for your convenience. Thank you for allowing [place] to be involved in your care.  It was great to see you today!  I hope you have a great Summer!!

## 2020-04-16 NOTE — Telephone Encounter (Signed)
Appointment scheduled.

## 2020-04-16 NOTE — Assessment & Plan Note (Signed)
Previous GI bleed as per HPI with heme positive stool in the ER and critical hemoglobin of 6.5, low iron/ferritin.  He was admitted for his anemia.  He was given blood and an EGD was performed that was unrevealing.  Recommended colonoscopy on 03/14/2020 but the patient only took half his prep and refused to have the colonoscopy done as an inpatient.  He essentially left AMA.  He returns today for outpatient scheduling of his colonoscopy so that he can proceed with cardiology evaluation of his foot wound due to poor lower extremity circulation.  At this point we will proceed with his colonoscopy.  Labs as per below.  Follow-up in 2 months.  Proceed with TCS with Dr. Gala Romney in near future: the risks, benefits, and alternatives have been discussed with the patient in detail. The patient states understanding and desires to proceed.  The patient is currently on Jardiance daily, Lexapro, iron, Glucophage, Januvia daily.  We will have him hold iron for 1 week prior to his colonoscopy. The patient is not on any other anticoagulants, anxiolytics, chronic pain medications, antidepressants, antidiabetics, or iron supplements.  Conscious sedation should be adequate for his procedure as it was for his last.

## 2020-04-17 ENCOUNTER — Other Ambulatory Visit: Payer: Self-pay

## 2020-04-17 ENCOUNTER — Encounter: Payer: Self-pay | Admitting: Family

## 2020-04-17 ENCOUNTER — Telehealth: Payer: Self-pay

## 2020-04-17 ENCOUNTER — Ambulatory Visit (INDEPENDENT_AMBULATORY_CARE_PROVIDER_SITE_OTHER): Payer: Medicare Other | Admitting: Family

## 2020-04-17 ENCOUNTER — Encounter: Payer: Self-pay | Admitting: Nurse Practitioner

## 2020-04-17 VITALS — BP 132/63 | HR 91 | Temp 97.6°F | Ht 74.0 in

## 2020-04-17 DIAGNOSIS — Z91199 Patient's noncompliance with other medical treatment and regimen due to unspecified reason: Secondary | ICD-10-CM

## 2020-04-17 DIAGNOSIS — I70229 Atherosclerosis of native arteries of extremities with rest pain, unspecified extremity: Secondary | ICD-10-CM

## 2020-04-17 DIAGNOSIS — Z9119 Patient's noncompliance with other medical treatment and regimen: Secondary | ICD-10-CM

## 2020-04-17 DIAGNOSIS — E118 Type 2 diabetes mellitus with unspecified complications: Secondary | ICD-10-CM

## 2020-04-17 DIAGNOSIS — I998 Other disorder of circulatory system: Secondary | ICD-10-CM

## 2020-04-17 DIAGNOSIS — I739 Peripheral vascular disease, unspecified: Secondary | ICD-10-CM

## 2020-04-17 DIAGNOSIS — E1140 Type 2 diabetes mellitus with diabetic neuropathy, unspecified: Secondary | ICD-10-CM

## 2020-04-17 MED ORDER — CEPHALEXIN 500 MG PO CAPS: 500.0000 mg | ORAL_CAPSULE | Freq: Three times a day (TID) | ORAL | 0 refills | Status: DC

## 2020-04-17 NOTE — Patient Instructions (Signed)
Diabetes Mellitus and Foot Care Foot care is an important part of your health, especially when you have diabetes. Diabetes may cause you to have problems because of poor blood flow (circulation) to your feet and legs, which can cause your skin to:  Become thinner and drier.  Break more easily.  Heal more slowly.  Peel and crack. You may also have nerve damage (neuropathy) in your legs and feet, causing decreased feeling in them. This means that you may not notice minor injuries to your feet that could lead to more serious problems. Noticing and addressing any potential problems early is the best way to prevent future foot problems. How to care for your feet Foot hygiene  Wash your feet daily with warm water and mild soap. Do not use hot water. Then, pat your feet and the areas between your toes until they are completely dry. Do not soak your feet as this can dry your skin.  Trim your toenails straight across. Do not dig under them or around the cuticle. File the edges of your nails with an emery board or nail file.  Apply a moisturizing lotion or petroleum jelly to the skin on your feet and to dry, brittle toenails. Use lotion that does not contain alcohol and is unscented. Do not apply lotion between your toes. Shoes and socks  Wear clean socks or stockings every day. Make sure they are not too tight. Do not wear knee-high stockings since they may decrease blood flow to your legs.  Wear shoes that fit properly and have enough cushioning. Always look in your shoes before you put them on to be sure there are no objects inside.  To break in new shoes, wear them for just a few hours a day. This prevents injuries on your feet. Wounds, scrapes, corns, and calluses  Check your feet daily for blisters, cuts, bruises, sores, and redness. If you cannot see the bottom of your feet, use a mirror or ask someone for help.  Do not cut corns or calluses or try to remove them with medicine.  If you  find a minor scrape, cut, or break in the skin on your feet, keep it and the skin around it clean and dry. You may clean these areas with mild soap and water. Do not clean the area with peroxide, alcohol, or iodine.  If you have a wound, scrape, corn, or callus on your foot, look at it several times a day to make sure it is healing and not infected. Check for: ? Redness, swelling, or pain. ? Fluid or blood. ? Warmth. ? Pus or a bad smell. General instructions  Do not cross your legs. This may decrease blood flow to your feet.  Do not use heating pads or hot water bottles on your feet. They may burn your skin. If you have lost feeling in your feet or legs, you may not know this is happening until it is too late.  Protect your feet from hot and cold by wearing shoes, such as at the beach or on hot pavement.  Schedule a complete foot exam at least once a year (annually) or more often if you have foot problems. If you have foot problems, report any cuts, sores, or bruises to your health care provider immediately. Contact a health care provider if:  You have a medical condition that increases your risk of infection and you have any cuts, sores, or bruises on your feet.  You have an injury that is not   healing.  You have redness on your legs or feet.  You feel burning or tingling in your legs or feet.  You have pain or cramps in your legs and feet.  Your legs or feet are numb.  Your feet always feel cold.  You have pain around a toenail. Get help right away if:  You have a wound, scrape, corn, or callus on your foot and: ? You have pain, swelling, or redness that gets worse. ? You have fluid or blood coming from the wound, scrape, corn, or callus. ? Your wound, scrape, corn, or callus feels warm to the touch. ? You have pus or a bad smell coming from the wound, scrape, corn, or callus. ? You have a fever. ? You have a red line going up your leg. Summary  Check your feet every day  for cuts, sores, red spots, swelling, and blisters.  Moisturize feet and legs daily.  Wear shoes that fit properly and have enough cushioning.  If you have foot problems, report any cuts, sores, or bruises to your health care provider immediately.  Schedule a complete foot exam at least once a year (annually) or more often if you have foot problems. This information is not intended to replace advice given to you by your health care provider. Make sure you discuss any questions you have with your health care provider. Document Revised: 07/31/2019 Document Reviewed: 12/09/2016 Elsevier Patient Education  2020 Elsevier Inc.  

## 2020-04-17 NOTE — Progress Notes (Signed)
Subjective:    [name], male    DOB: [birth year], 69 y.o.   MRN: [id number]  Chief Complaint  Patient presents with  . Numbness    pt here today c/o right foot numbness and falling more often     HPI PT presents to the office today left numbness and increased falling. He is followed by Podiatry every week for right foot infection. He not currently on any oral antibiotic, but using Bactroban BID with dressing changes.   PT has been hospitalized for this and has been noncompliant.    Review of Systems  Skin: Positive for wound.  All other systems reviewed and are negative.      Objective:   Physical Exam Vitals reviewed.  Constitutional:      General: He is not in acute distress.    Appearance: He is well-developed.  HENT:     Head: Normocephalic.  Eyes:     General:        Right eye: No discharge.        Left eye: No discharge.     Pupils: Pupils are equal, round, and reactive to light.  Neck:     Thyroid: No thyromegaly.  Cardiovascular:     Rate and Rhythm: Normal rate and regular rhythm.     Heart sounds: Normal heart sounds. No murmur.  Pulmonary:     Effort: Pulmonary effort is normal. No respiratory distress.     Breath sounds: Normal breath sounds. No wheezing.  Abdominal:     General: Bowel sounds are normal. There is no distension.     Palpations: Abdomen is soft.     Tenderness: There is no abdominal tenderness.  Musculoskeletal:        General: Tenderness present.     Cervical back: Normal range of motion and neck supple.  Skin:    General: Skin is warm and dry.     Findings: No erythema or rash.     Comments: Toes black with no sensation, diabetic ulcer present on great right toe,  Heel, and ankle. See picture.   Neurological:     Mental Status: He is alert and oriented to person, place, and time.     Cranial Nerves: No cranial nerve deficit.     Motor: Weakness present.     Deep Tendon Reflexes: Reflexes are normal and  symmetric.  Psychiatric:        Behavior: Behavior normal.        Thought Content: Thought content normal.        Judgment: Judgment normal.                 Assessment & Plan:  Omar Todd comes in today with chief complaint of Numbness (pt here today c/o right foot numbness and falling more often)   Diagnosis and orders addressed:  1. Diabetic foot ([place]) - Ambulatory referral to Vascular Surgery - AMB referral to peripheral vascular navigator - cephALEXin (KEFLEX) 500 MG capsule; Take 1 capsule (500 mg total) by mouth 3 (three) times daily.  Dispense: 30 capsule; Refill: 0  2. PAD (peripheral artery disease) ([place]) - Ambulatory referral to Vascular Surgery - AMB referral to peripheral vascular navigator  3. Type 2 diabetes mellitus with diabetic neuropathy, unspecified whether long term insulin use ([place]) - Ambulatory referral to Vascular Surgery - AMB referral to peripheral vascular navigator  4. Critical lower limb ischemia  5. Noncompliance   I spoke with pt's PCP. I  will place referral to Vascular and I have also placed referral and called and spoke with RN over limb preservation. Pt's last A1C was stable. Discussed needs good control of DM. IF area worsens go to ED. Will start Keflex to help with redness and swelling of foot.   Evelina Dun, FNP

## 2020-04-17 NOTE — Telephone Encounter (Signed)
Spoke to [place] at [facility], can do ASAP TCS w/RMR 04/29/20 at 7:30am.  Called pt, offered TCS 04/29/20. Michela Pitcher he thinks he has another appt that day. Per appt desk in [place] he has appt for diabetic eye exam 04/29/20. Asked pt if he would be able to reschedule the appt. Stated it's been rescheduled already and he will have to keep the appt.

## 2020-04-18 DIAGNOSIS — L97512 Non-pressure chronic ulcer of other part of right foot with fat layer exposed: Secondary | ICD-10-CM | POA: Diagnosis not present

## 2020-04-21 ENCOUNTER — Other Ambulatory Visit: Payer: Self-pay | Admitting: Family

## 2020-04-21 ENCOUNTER — Other Ambulatory Visit: Payer: Self-pay

## 2020-04-21 ENCOUNTER — Telehealth: Payer: Self-pay

## 2020-04-21 ENCOUNTER — Telehealth: Payer: Self-pay | Admitting: Internal Medicine

## 2020-04-21 ENCOUNTER — Telehealth (HOSPITAL_COMMUNITY): Payer: Self-pay

## 2020-04-21 MED ORDER — CLENPIQ 10-3.5-12 MG-GM -GM/160ML PO SOLN
1.0000 | Freq: Once | ORAL | 0 refills | Status: AC
Start: 2020-04-21 — End: 2020-04-21

## 2020-04-21 NOTE — Telephone Encounter (Signed)
Pt's sister called office this morning requesting to schedule TCS (see other phone note). Spoke to sister [name]), TCS w/RMR scheduled for 05/06/20 at 12:15pm. COVID test 05/04/20 at 10:15am. Rx for prep sent to pharmacy. Appt letter and procedure instructions mailed.

## 2020-04-21 NOTE — Telephone Encounter (Signed)
Pt's sister called asking to have patient's TCS done ASAP and not wait until August. She said that patient had sent message via Mychart to [facility] and I see in phone note from MB that patient was offered TCS for 04/29/2020. Please call [phone]

## 2020-04-21 NOTE — Telephone Encounter (Signed)
Procedure orders entered.

## 2020-04-21 NOTE — Telephone Encounter (Signed)
PV Navigator Consult received evening 04/17/20 from [facility] FNP. I was able to reach out to patient via telephone to introduce myself and my role. Mr Folgar presented at [facility] 04/17/20 with chronic and worsening wounds to his right foot and toes and was started on antibiotics and referred to vascular for consult. Patient currently goes to [facility] for wound care to these areas.  I saw in patient's chart that he was recently scheduled for abdominal aortogram 03/14/20 with Dr Gwenlyn Found, but procedure was canceled due to low hemoglobin. He was then scheduled for EGD and colonoscopy per Dr Gwenlyn Found to determine the source of bleeding. Patient kept EGD appointment, however refused his colonoscopy appointment. Since then, he has had episodes of bleeding with bowel movements and has decided he would like to proceed with colonoscopy so that he may also pursue rescheduling of the aortogram as well. Colonoscopy now scheduled for 05/06/20. I have also reached out to Willa Rough with Dr Kennon Holter office to re-schedule aortogram pending colonoscopy results and appointment was set 05/12/20 to discuss this possibility.   Patient lives alone. He states his wife is in a SNF due to stroke and his sister [name] assists him when needed. He says he normally drives himself to his appointments however that has become more difficult as his feet have worsened. With patient's consent I have also spoke with sister [name]) who states she can transport patient to his appointments if needed. No other family support available.  CMH to include DM-2, HTN, HLD, PAD, CAD, COPD and active every day smoker. (Patient states he is not interested in assistance with smoking cessation at this time)  Patient already has a wound appointment with [facility] set for 04/23/20 @11 :30.  Denies other barriers/questions at this time. Patient has PV Navigator contact information should barriers arise.   Will  follow. Thank you for the opportunity to work with this patient. Cletis Media RN BSN [place] [phone]

## 2020-04-21 NOTE — Telephone Encounter (Signed)
Spoke to [employer] at critical ischemic leg clinic at [facility].She stated patient's legs are worse.Stated PV angiogram was canceled due to low hgb.She was wanting to know if we can reschedule angiogram.Spoke to patient's sister Omar Todd.Patient is scheduled to have colonoscopy 6/16 with Dr.Rourke.Appointment scheduled with Dr.Berry 6/22 at 10:45 am to discuss rescheduling pv angiogram.

## 2020-04-21 NOTE — Telephone Encounter (Signed)
See other phone that was started 04/17/20.

## 2020-04-23 ENCOUNTER — Ambulatory Visit: Payer: Medicare Other | Admitting: Podiatry

## 2020-04-23 NOTE — Progress Notes (Signed)
  Subjective:  [name], male    DOB: 1951/07/13,  MRN: [id number]  Chief Complaint  Patient presents with  . Wound Check    Right foot digits 3,4,5 are blackened including proximal tissues. Blood drainage. Pt denies fever  . Wound Check    Right posterior heel wound check.   69 y.o. male presents for wound care. Hx confirmed with patient. Still has not followed up with GI or Vascular. Needs refill of Santyl  Objective:  Physical Exam: Wound Location: Right heel Wound Measurement: 1.5x1 Wound Base: Necrotic eschar Peri-wound: reddened Exudate: None: wound tissue dry appears to have worsened since last recheck No purulence or signs of acute infection today  Gangrene 3rd/4th/5th toes right, distal hallux. Pallor right foot up to mid shin.  No images are attached to the encounter.   Assessment:   1. Ulcer of great toe, right, with fat layer exposed ([place])   2. Ulcer of heel, right, with fat layer exposed ([place])   3. Ulcer of right midfoot limited to breakdown of skin ([place])   4. PAD (peripheral artery disease) ([place])   5. Gangrene ([place])      Plan:  Patient was evaluated and treated and all questions answered.  Gangrene R Foot, CLI  -Continued Gangrene. Patient has been non-compliant with medical care. -Betadine WTD dressings to all gangrenous areas at present, resume Santyl to heel and to hallux and betadine to ischemic areas of toes when patient refills it. -Discussed importance f/u with GI so we can figure out the cause of bleeding that is delaying vascular intervention -Discussed again I am concerned he may end up in a below knee amputation.   Return in about 2 weeks (around 04/23/2020) for [place] f/u .

## 2020-04-29 ENCOUNTER — Encounter: Payer: Self-pay | Admitting: Family Medicine

## 2020-05-04 ENCOUNTER — Other Ambulatory Visit: Payer: Self-pay

## 2020-05-04 ENCOUNTER — Other Ambulatory Visit (HOSPITAL_COMMUNITY)
Admission: RE | Admit: 2020-05-04 | Discharge: 2020-05-04 | Disposition: A | Discharge status: Home / Self Care | Payer: Medicare Other | Source: Ambulatory Visit | Attending: Internal Medicine | Admitting: Internal Medicine

## 2020-05-04 DIAGNOSIS — Z20822 Contact with and (suspected) exposure to covid-19: Secondary | ICD-10-CM | POA: Diagnosis not present

## 2020-05-04 DIAGNOSIS — Z01812 Encounter for preprocedural laboratory examination: Secondary | ICD-10-CM | POA: Diagnosis not present

## 2020-05-04 LAB — SARS CORONAVIRUS 2 (TAT 6-24 HRS): SARS Coronavirus 2: NEGATIVE

## 2020-05-04 NOTE — Progress Notes (Signed)
  Subjective:  [name], male    DOB: Dec 15, 1950,  MRN: [id number]  Chief Complaint  Patient presents with  . Wound Check    Right foot wound care. Interdigital wounds 5,4,3. Pt has band-aids on today but occasionally has betadine and gauze. Denies fever/chills/nausea/vomiting but admits frequent diarrhea.  . Nail Problem    Nail fell off right 5th digit. Unknown cause/duration.  . Foot Injury    Cracked open skin right posterior heel. Unknown cause/duration.   69 y.o. male presents for wound care. Hx confirmed with patient.  Objective:  Physical Exam: Wound Location: Right hallux Wound Measurement: 1.5x1 Wound Base: Fibrotic slough Peri-wound: Normal Exudate: None: wound tissue dry wound without warmth, erythema, signs of acute infection  Wound Location: 4th toe sulcus Wound Measurement: 0.5x1 Wound Base: fibrotic Peri-wound: Normal Exudate: None: wound tissue dry wound without warmth, erythema, signs of acute infection  Wound Location: R heel  Wound Measurement: 1x1 Wound Base: Granular/Healthy Peri-wound: Calloused Exudate: None: wound tissue dry wound without warmth, erythema, signs of acute infection  No images are attached to the encounter.  Radiographs:  X-ray of the right foot: no soft tissue emphysema, no signs of osteomyelitis Assessment:   1. Ulcer of great toe, right, with fat layer exposed ([name])   2. Ulcer of heel, right, with fat layer exposed ([name])   3. Ulcer of right midfoot limited to breakdown of skin ([name])   4. PAD (peripheral artery disease) ([name])     Plan:  Patient was evaluated and treated and all questions answered.  Ulcer of Right Great Toe, Right Heel, Right 4th toe Sulcus -Dressing applied consisting of silvadene and band-aid -Offload ulcer with surgical shoe -Continue santyl -No signs of acute infection noted  -Pain at night likely ischemic -Arterial studies reviewed discussed the importance of f/u with vascular  for possible intervention. -Order Arterials -Discussed again importance of smoking cessation  No follow-ups on file.

## 2020-05-06 ENCOUNTER — Encounter: Payer: Self-pay | Admitting: Podiatry

## 2020-05-06 ENCOUNTER — Encounter (HOSPITAL_COMMUNITY): Payer: Self-pay | Admitting: Internal Medicine

## 2020-05-06 ENCOUNTER — Encounter (HOSPITAL_COMMUNITY)
Admission: RE | Disposition: A | Discharge status: Home / Self Care | Payer: Self-pay | Source: Home / Self Care | Attending: Internal Medicine

## 2020-05-06 ENCOUNTER — Other Ambulatory Visit: Payer: Self-pay

## 2020-05-06 ENCOUNTER — Ambulatory Visit (HOSPITAL_COMMUNITY)
Admission: RE | Admit: 2020-05-06 | Discharge: 2020-05-06 | Disposition: A | Discharge status: Home / Self Care | Payer: Medicare Other | Attending: Internal Medicine | Admitting: Internal Medicine

## 2020-05-06 DIAGNOSIS — E785 Hyperlipidemia, unspecified: Secondary | ICD-10-CM | POA: Diagnosis not present

## 2020-05-06 DIAGNOSIS — J449 Chronic obstructive pulmonary disease, unspecified: Secondary | ICD-10-CM | POA: Diagnosis not present

## 2020-05-06 DIAGNOSIS — K219 Gastro-esophageal reflux disease without esophagitis: Secondary | ICD-10-CM | POA: Diagnosis not present

## 2020-05-06 DIAGNOSIS — E119 Type 2 diabetes mellitus without complications: Secondary | ICD-10-CM | POA: Diagnosis not present

## 2020-05-06 DIAGNOSIS — F17210 Nicotine dependence, cigarettes, uncomplicated: Secondary | ICD-10-CM | POA: Diagnosis not present

## 2020-05-06 DIAGNOSIS — Z823 Family history of stroke: Secondary | ICD-10-CM | POA: Insufficient documentation

## 2020-05-06 DIAGNOSIS — Z7982 Long term (current) use of aspirin: Secondary | ICD-10-CM | POA: Insufficient documentation

## 2020-05-06 DIAGNOSIS — D509 Iron deficiency anemia, unspecified: Secondary | ICD-10-CM

## 2020-05-06 DIAGNOSIS — Z809 Family history of malignant neoplasm, unspecified: Secondary | ICD-10-CM | POA: Insufficient documentation

## 2020-05-06 DIAGNOSIS — Z5309 Procedure and treatment not carried out because of other contraindication: Secondary | ICD-10-CM | POA: Insufficient documentation

## 2020-05-06 DIAGNOSIS — Z7984 Long term (current) use of oral hypoglycemic drugs: Secondary | ICD-10-CM | POA: Diagnosis not present

## 2020-05-06 DIAGNOSIS — Z880 Allergy status to penicillin: Secondary | ICD-10-CM | POA: Diagnosis not present

## 2020-05-06 DIAGNOSIS — Z833 Family history of diabetes mellitus: Secondary | ICD-10-CM | POA: Diagnosis not present

## 2020-05-06 DIAGNOSIS — Z79899 Other long term (current) drug therapy: Secondary | ICD-10-CM | POA: Insufficient documentation

## 2020-05-06 DIAGNOSIS — I10 Essential (primary) hypertension: Secondary | ICD-10-CM | POA: Diagnosis not present

## 2020-05-06 HISTORY — PX: FLEXIBLE SIGMOIDOSCOPY: SHX5431

## 2020-05-06 LAB — GLUCOSE, CAPILLARY: Glucose-Capillary: 160 mg/dL — ABNORMAL HIGH (ref 70–99)

## 2020-05-06 SURGERY — SIGMOIDOSCOPY, FLEXIBLE
Anesthesia: Moderate Sedation

## 2020-05-06 MED ORDER — MIDAZOLAM HCL 5 MG/5ML IJ SOLN
INTRAMUSCULAR | Status: DC | PRN
  Administered 2020-05-06: 2 mg via INTRAVENOUS

## 2020-05-06 MED ORDER — ONDANSETRON HCL 4 MG/2ML IJ SOLN
INTRAMUSCULAR | Status: DC | PRN
  Administered 2020-05-06: 4 mg via INTRAVENOUS

## 2020-05-06 MED ORDER — SODIUM CHLORIDE 0.9 % IV SOLN: INTRAVENOUS | Status: DC

## 2020-05-06 MED ORDER — MIDAZOLAM HCL 5 MG/5ML IJ SOLN
INTRAMUSCULAR | Status: AC
  Filled 2020-05-06: qty 10

## 2020-05-06 MED ORDER — MEPERIDINE HCL 50 MG/ML IJ SOLN
INTRAMUSCULAR | Status: AC
  Filled 2020-05-06: qty 1

## 2020-05-06 MED ORDER — MEPERIDINE HCL 100 MG/ML IJ SOLN
INTRAMUSCULAR | Status: DC | PRN
  Administered 2020-05-06: 25 mg

## 2020-05-06 MED ORDER — ONDANSETRON HCL 4 MG/2ML IJ SOLN
INTRAMUSCULAR | Status: AC
  Filled 2020-05-06: qty 2

## 2020-05-06 NOTE — Discharge Instructions (Signed)
  Colonoscopy Discharge Instructions  Read the instructions outlined below and refer to this sheet in the next few weeks. These discharge instructions provide you with general information on caring for yourself after you leave the hospital. Your doctor may also give you specific instructions. While your treatment has been planned according to the most current medical practices available, unavoidable complications occasionally occur. If you have any problems or questions after discharge, call Dr. Gala Romney at [phone]. ACTIVITY  You may resume your regular activity, but move at a slower pace for the next 24 hours.   Take frequent rest periods for the next 24 hours.   Walking will help get rid of the air and reduce the bloated feeling in your belly (abdomen).   No driving for 24 hours (because of the medicine (anesthesia) used during the test).    Do not sign any important legal documents or operate any machinery for 24 hours (because of the anesthesia used during the test).  NUTRITION  Drink plenty of fluids.   You may resume your normal diet as instructed by your doctor.   Begin with a light meal and progress to your normal diet. Heavy or fried foods are harder to digest and may make you feel sick to your stomach (nauseated).   Avoid alcoholic beverages for 24 hours or as instructed.  MEDICATIONS  You may resume your normal medications unless your doctor tells you otherwise.  WHAT YOU CAN EXPECT TODAY  Some feelings of bloating in the abdomen.   Passage of more gas than usual.   Spotting of blood in your stool or on the toilet paper.  IF YOU HAD POLYPS REMOVED DURING THE COLONOSCOPY:  No aspirin products for 7 days or as instructed.   No alcohol for 7 days or as instructed.   Eat a soft diet for the next 24 hours.  FINDING OUT THE RESULTS OF YOUR TEST Not all test results are available during your visit. If your test results are not back during the visit, make an appointment  with your caregiver to find out the results. Do not assume everything is normal if you have not heard from your caregiver or the medical facility. It is important for you to follow up on all of your test results.  SEEK IMMEDIATE MEDICAL ATTENTION IF:  You have more than a spotting of blood in your stool.   Your belly is swollen (abdominal distention).   You are nauseated or vomiting.   You have a temperature over 101.   You have abdominal pain or discomfort that is severe or gets worse throughout the day.    Your colonoscopy could not be completed today because you were not prepped.  Office visit with [place] to reassess in 3 to 4 weeks  At patient request I called Rosemarie Beath at [phone]  -left message on answering service.

## 2020-05-06 NOTE — Progress Notes (Signed)
Upon discharge with sister , sister and patient agree to contact current Foot doctor and request visit today or tomorrow, if cannot be seen they may decide to go to an Emergency room regarding foot.

## 2020-05-06 NOTE — Progress Notes (Signed)
Patient verbalizes  " I know they are eventually gonna have to take my foot off" . Patient agrees to see Foot doctor as soon as possible.

## 2020-05-06 NOTE — Progress Notes (Signed)
Right foot noted to have erythema and bandages "I see a doctor for my foot, about two weeks ago".  Toes right foot with black crust and sister Rosemarie Beath advised to call Foot Doctor for patient due to patient had sedation today, to schedule follow up appointment today or tomorrow.  Patient and sister verbalized understanding of need to see Foot doctor for followup.

## 2020-05-06 NOTE — H&P (Signed)
@LOGO @   Primary Care Physician:  [name], DO Primary Gastroenterologist:  Dr. Gala Romney  Pre-Procedure History & Physical: HPI:  Omar Todd is a 69 y.o. male here for colonoscopy to evaluate microcytic anemia requiring transfusion recently.  Past Medical History:  Diagnosis Date  . COPD (chronic obstructive pulmonary disease) ([place])   . Diabetes mellitus without complication ([place])   . Gangrene of toe of right foot ([place])   . GERD (gastroesophageal reflux disease)   . Hyperlipidemia   . Hypertension     Past Surgical History:  Procedure Laterality Date  . BIOPSY  03/13/2020   Procedure: BIOPSY;  Surgeon: Daneil Dolin, MD;  Location: [facility];  Service: Endoscopy;;  gastric biopsy  . ESOPHAGOGASTRODUODENOSCOPY N/A 03/13/2020   Procedure: ESOPHAGOGASTRODUODENOSCOPY (EGD);  Surgeon: Daneil Dolin, MD;  Location: [facility];  Service: Endoscopy;  Laterality: N/A;  . Thumb surgery      Prior to Admission medications   Medication Sig Start Date End Date Taking? Authorizing Provider  amLODipine (NORVASC) 10 MG tablet Take 1 tablet (10 mg total) by mouth daily. 03/14/20  Yes Roxan Hockey, MD  aspirin EC 81 MG tablet Take 1 tablet (81 mg total) by mouth daily with breakfast. 03/14/20  Yes Emokpae, Courage, MD  BIOTIN 5000 PO Take 5,000 mcg by mouth daily.    Yes [provider]  cephALEXin (KEFLEX) 500 MG capsule Take 1 capsule (500 mg total) by mouth 3 (three) times daily. 04/17/20  Yes Hawks, Christy A, FNP  cholecalciferol (VITAMIN D) 1000 UNITS tablet Take 1,000 Units by mouth daily.   Yes [provider]  collagenase (SANTYL) ointment Apply 1 application topically daily. Wound Measurements:  Right heel - 1.5 x 0.1cm Patient taking differently: Apply 1 application topically daily.  03/27/20  Yes Evelina Bucy, DPM  empagliflozin (JARDIANCE) 25 MG TABS tablet Take 25 mg by mouth daily before breakfast. 03/14/20  Yes Emokpae, Courage, MD   escitalopram (LEXAPRO) 10 MG tablet Take 1 tablet (10 mg total) by mouth daily. 03/14/20  Yes Roxan Hockey, MD  ferrous sulfate 325 (65 FE) MG tablet Take 1 tablet (325 mg total) by mouth daily with breakfast. 03/14/20  Yes Emokpae, Courage, MD  hydrochlorothiazide (HYDRODIURIL) 25 MG tablet Take 1 tablet (25 mg total) by mouth daily. 03/14/20  Yes Emokpae, Courage, MD  lisinopril (ZESTRIL) 40 MG tablet TAKE ONE (1) TABLET EACH DAY Patient taking differently: Take 40 mg by mouth daily.  03/14/20  Yes Roxan Hockey, MD  metFORMIN (GLUCOPHAGE) 1000 MG tablet Take 1 tablet (1,000 mg total) by mouth 2 (two) times daily with a meal. 03/14/20  Yes Emokpae, Courage, MD  metoprolol tartrate (LOPRESSOR) 25 MG tablet Take 1 tablet (25 mg total) by mouth 2 (two) times daily. 03/14/20  Yes Emokpae, Courage, MD  montelukast (SINGULAIR) 10 MG tablet TAKE ONE TABLET DAILY AT BEDTIME Patient taking differently: Take 10 mg by mouth at bedtime.  01/07/20  Yes Ronnie Doss M, DO  mupirocin cream (BACTROBAN) 2 % Apply 1 application topically 2 (two) times daily. 02/05/20  Yes Volanda Napoleon, PA-C  pantoprazole (PROTONIX) 40 MG tablet Take 1 tablet (40 mg total) by mouth daily. 03/15/20  Yes Roxan Hockey, MD  pravastatin (PRAVACHOL) 40 MG tablet Take 1 tablet (40 mg total) by mouth every evening. 03/14/20  Yes Emokpae, Courage, MD  [name] (JANUVIA) 100 MG tablet Take 1 tablet (100 mg total) by mouth daily. 03/14/20  Yes Roxan Hockey, MD  ACCU-CHEK GUIDE test strip  04/02/20   [provider]  acetaminophen (TYLENOL) 500 MG tablet Take 1,000 mg by mouth 2 (two) times daily as needed for moderate pain or headache.    [provider]    Allergies as of 04/21/2020 - Review Complete 04/17/2020  Allergen Reaction Noted  . Penicillins Other (See Comments) 04/11/2013    Family History  Problem Relation Age of Onset  . Cancer Father   . Diabetes Sister   . Stroke Brother   . Healthy  Sister   . Healthy Sister   . Colon cancer Neg Hx   . Gastric cancer Neg Hx     Social History   Socioeconomic History  . Marital status: Married    Spouse name: [name]  . Number of children: 3  . Years of education: 9th Grade  . Highest education level: 9th grade  Occupational History  . Occupation: Retired    Comment: Science writer  Tobacco Use  . Smoking status: Current Every Day Smoker    Packs/day: 0.50    Years: 60.00    Pack years: 30.00    Types: Cigarettes  . Smokeless tobacco: Never Used  Vaping Use  . Vaping Use: Former  Substance and Sexual Activity  . Alcohol use: No    Alcohol/week: 0.0 standard drinks  . Drug use: No  . Sexual activity: Yes    Birth control/protection: None  Other Topics Concern  . Not on file  Social History Narrative  . Not on file   Social Determinants of Health   Financial Resource Strain: Low Risk   . Difficulty of Paying Living Expenses: Not very hard  Food Insecurity: No Food Insecurity  . Worried About Charity fundraiser in the Last Year: Never true  . Ran Out of Food in the Last Year: Never true  Transportation Needs: No Transportation Needs  . Lack of Transportation (Medical): No  . Lack of Transportation (Non-Medical): No  Physical Activity: Inactive  . Days of Exercise per Week: 0 days  . Minutes of Exercise per Session: 0 min  Stress: No Stress Concern Present  . Feeling of Stress : Only a little  Social Connections: Socially Integrated  . Frequency of Communication with Friends and Family: More than three times a week  . Frequency of Social Gatherings with Friends and Family: More than three times a week  . Attends Religious Services: More than 4 times per year  . Active Member of Clubs or Organizations: Yes  . Attends Archivist Meetings: More than 4 times per year  . Marital Status: Married  Human resources officer Violence: Not At Risk  . Fear of Current or Ex-Partner: No  . Emotionally  Abused: No  . Physically Abused: No  . Sexually Abused: No    Review of Systems: See HPI, otherwise negative ROS  Physical Exam: BP (!) 153/65   Pulse 93   Temp 99 F (37.2 C) (Oral)   Resp 15   Ht 6\' 2"  (1.88 m)   Wt 90.7 kg   SpO2 100%   BMI 25.68 kg/m  General:   Alert,  Well-developed, well-nourished, pleasant and cooperative in NAD Neck:  Supple; no masses or thyromegaly. No significant cervical adenopathy. Lungs:  Clear throughout to auscultation.   No wheezes, crackles, or rhonchi. No acute distress. Heart:  Regular rate and rhythm; no murmurs, clicks, rubs,  or gallops. Abdomen: Non-distended, normal bowel sounds.  Soft and nontender without appreciable mass  or hepatosplenomegaly.   Impression/Plan: 69 year old gentleman with microcytic anemia requiring transfusion recently.  Here for diagnostic colonoscopy per plan. The risks, benefits, limitations, alternatives and imponderables have been reviewed with the patient. Questions have been answered. All parties are agreeable.      Notice: This dictation was prepared with Dragon dictation along with smaller phrase technology. Any transcriptional errors that result from this process are unintentional and may not be corrected upon review.

## 2020-05-06 NOTE — Op Note (Addendum)
[facility] Patient Name: Omar Todd Procedure Date: 05/06/2020 10:51 AM MRN: [id number] Date of Birth: 09-10-1951 Attending MD: Norvel Richards , MD CSN: [id number] Age: 69 Admit Type: Outpatient Procedure:                Sigmoidoscopy (attempted colonoscopy) Indications:              Iron deficiency anemia Providers:                Norvel Richards, MD, Crystal Page, Nelma Rothman,                            Technician Referring MD:              Medicines:                Midazolam 2 mg IV, Meperidine 25 mg IV Complications:            No immediate complications. Estimated Blood Loss:     Estimated blood loss: none. Procedure:                After obtaining informed consent, the colonoscope                            was passed under direct vision. Throughout the                            procedure, the patient's blood pressure, pulse, and                            oxygen saturations were monitored continuously. The                            [id number]-[id number] ([id number]) scope was introduced through                            the anus and advanced to the proximal rectum. Scope In: 10:56:57 AM Scope Out: 10:57:56 AM Total Procedure Duration: 0 hours 0 minutes 59 seconds  Findings:      The perianal and digital rectal examinations were normal.       Endoscopically, patient was not prepped had formed stool in the proximal       rectum; the lumen of the rectosigmoid colon was occluded with formed       stool. The procedure was aborted. Impression:               -Patient not prepped for colonoscopy as described                            above Moderate Sedation:      Moderate (conscious) sedation was administered by the endoscopy nurse       and supervised by the endoscopist. The following parameters were       monitored: oxygen saturation, heart rate, blood pressure, respiratory       rate, EKG, adequacy of pulmonary ventilation, and response to care.       Total physician  intraservice time was 3 minutes. Recommendation:           - Patient has a contact number available for  emergencies. The signs and symptoms of potential                            delayed complications were discussed with the                            patient. Return to normal activities tomorrow.                            Written discharge instructions were provided to the                            patient.                           - Advance diet as tolerated. Follow-up with [place] in                            the office in 3 to 4 weeks to regroup. Procedure Code(s):        --- Professional ---                           [phone], Colonoscopy, flexible; diagnostic, including                            collection of specimen(s) by brushing or washing,                            when performed (separate procedure) Diagnosis Code(s):        --- Professional ---                           D50.9, Iron deficiency anemia, unspecified CPT copyright 2019 American Medical Association. All rights reserved. The codes documented in this report are preliminary and upon coder review may  be revised to meet current compliance requirements. [name] Estimable. Elanora Quin, MD Norvel Richards, MD 05/06/2020 11:05:58 AM This report has been signed electronically. Number of Addenda: 0

## 2020-05-08 ENCOUNTER — Encounter (HOSPITAL_COMMUNITY): Payer: Self-pay | Admitting: Internal Medicine

## 2020-05-11 ENCOUNTER — Encounter: Payer: Self-pay | Admitting: Family Medicine

## 2020-05-12 ENCOUNTER — Ambulatory Visit: Payer: Medicare Other | Admitting: Cardiovascular Disease

## 2020-05-13 ENCOUNTER — Telehealth: Payer: Self-pay

## 2020-05-13 ENCOUNTER — Encounter: Payer: Self-pay | Admitting: Podiatry

## 2020-05-13 NOTE — Telephone Encounter (Signed)
Emergency contact [name] was called on behalf of pt Omar Todd. Emergency contact was advised to take the patient to the emergency room, due to concern of serious infection - based off of messages sent by [name] where she stated "He is dying". Emergency contact was advised to take the patient immediately to the ER. The emergency contact seemed upset by this and did argue about going to the emergency room and then proceeded to hang up the phone.

## 2020-05-14 ENCOUNTER — Telehealth: Payer: Self-pay | Admitting: Podiatry

## 2020-05-14 NOTE — Telephone Encounter (Signed)
Spoke with patient's sister [name]. Patient continues to have worsening gangrene of his foot. He has low motivation for self-care.  As discussed with him last visit I think he will end up with a below-knee amputation. Per Ms. [name] he understands that he most likely will need to have his foot amputated.  He has missed his last appt with [place]. He was unable to have his colonoscopy. His vascular procedures have been delayed because of anemia. He is in an unfortunate and complex situation. I discussed with Ms. [name] at this point he needs to go to the ED for evaluation and management.

## 2020-05-15 ENCOUNTER — Inpatient Hospital Stay (HOSPITAL_COMMUNITY)
Admission: AD | Admit: 2020-05-15 | Discharge: 2020-05-20 | DRG: 239 | Disposition: A | Discharge status: Skilled Nursing Facility | Payer: Medicare Other | Attending: Internal Medicine | Admitting: Internal Medicine

## 2020-05-15 ENCOUNTER — Emergency Department (HOSPITAL_COMMUNITY): Payer: Medicare Other

## 2020-05-15 ENCOUNTER — Other Ambulatory Visit: Payer: Self-pay

## 2020-05-15 ENCOUNTER — Encounter (HOSPITAL_COMMUNITY): Payer: Self-pay | Admitting: *Deleted

## 2020-05-15 DIAGNOSIS — I739 Peripheral vascular disease, unspecified: Secondary | ICD-10-CM | POA: Diagnosis present

## 2020-05-15 DIAGNOSIS — Z79899 Other long term (current) drug therapy: Secondary | ICD-10-CM

## 2020-05-15 DIAGNOSIS — Z89511 Acquired absence of right leg below knee: Secondary | ICD-10-CM | POA: Diagnosis not present

## 2020-05-15 DIAGNOSIS — E1169 Type 2 diabetes mellitus with other specified complication: Secondary | ICD-10-CM | POA: Diagnosis present

## 2020-05-15 DIAGNOSIS — D649 Anemia, unspecified: Secondary | ICD-10-CM

## 2020-05-15 DIAGNOSIS — Z20822 Contact with and (suspected) exposure to covid-19: Secondary | ICD-10-CM | POA: Diagnosis present

## 2020-05-15 DIAGNOSIS — E1159 Type 2 diabetes mellitus with other circulatory complications: Secondary | ICD-10-CM | POA: Diagnosis not present

## 2020-05-15 DIAGNOSIS — I998 Other disorder of circulatory system: Secondary | ICD-10-CM | POA: Diagnosis not present

## 2020-05-15 DIAGNOSIS — Z4781 Encounter for orthopedic aftercare following surgical amputation: Secondary | ICD-10-CM | POA: Diagnosis not present

## 2020-05-15 DIAGNOSIS — I10 Essential (primary) hypertension: Secondary | ICD-10-CM | POA: Diagnosis not present

## 2020-05-15 DIAGNOSIS — Z03818 Encounter for observation for suspected exposure to other biological agents ruled out: Secondary | ICD-10-CM | POA: Diagnosis not present

## 2020-05-15 DIAGNOSIS — M7989 Other specified soft tissue disorders: Secondary | ICD-10-CM | POA: Diagnosis not present

## 2020-05-15 DIAGNOSIS — I70201 Unspecified atherosclerosis of native arteries of extremities, right leg: Secondary | ICD-10-CM | POA: Diagnosis not present

## 2020-05-15 DIAGNOSIS — E1165 Type 2 diabetes mellitus with hyperglycemia: Secondary | ICD-10-CM | POA: Diagnosis not present

## 2020-05-15 DIAGNOSIS — D509 Iron deficiency anemia, unspecified: Secondary | ICD-10-CM | POA: Diagnosis present

## 2020-05-15 DIAGNOSIS — L089 Local infection of the skin and subcutaneous tissue, unspecified: Secondary | ICD-10-CM | POA: Diagnosis not present

## 2020-05-15 DIAGNOSIS — M2550 Pain in unspecified joint: Secondary | ICD-10-CM | POA: Diagnosis not present

## 2020-05-15 DIAGNOSIS — R0989 Other specified symptoms and signs involving the circulatory and respiratory systems: Secondary | ICD-10-CM | POA: Diagnosis not present

## 2020-05-15 DIAGNOSIS — E785 Hyperlipidemia, unspecified: Secondary | ICD-10-CM | POA: Diagnosis not present

## 2020-05-15 DIAGNOSIS — N400 Enlarged prostate without lower urinary tract symptoms: Secondary | ICD-10-CM | POA: Diagnosis present

## 2020-05-15 DIAGNOSIS — E1140 Type 2 diabetes mellitus with diabetic neuropathy, unspecified: Secondary | ICD-10-CM | POA: Diagnosis present

## 2020-05-15 DIAGNOSIS — I350 Nonrheumatic aortic (valve) stenosis: Secondary | ICD-10-CM | POA: Diagnosis present

## 2020-05-15 DIAGNOSIS — E43 Unspecified severe protein-calorie malnutrition: Secondary | ICD-10-CM

## 2020-05-15 DIAGNOSIS — S7221XD Displaced subtrochanteric fracture of right femur, subsequent encounter for closed fracture with routine healing: Secondary | ICD-10-CM | POA: Diagnosis not present

## 2020-05-15 DIAGNOSIS — IMO0002 Reserved for concepts with insufficient information to code with codable children: Secondary | ICD-10-CM

## 2020-05-15 DIAGNOSIS — I714 Abdominal aortic aneurysm, without rupture, unspecified: Secondary | ICD-10-CM | POA: Diagnosis present

## 2020-05-15 DIAGNOSIS — M87874 Other osteonecrosis, right foot: Secondary | ICD-10-CM | POA: Diagnosis not present

## 2020-05-15 DIAGNOSIS — I96 Gangrene, not elsewhere classified: Secondary | ICD-10-CM | POA: Diagnosis not present

## 2020-05-15 DIAGNOSIS — Z7982 Long term (current) use of aspirin: Secondary | ICD-10-CM | POA: Diagnosis not present

## 2020-05-15 DIAGNOSIS — I70261 Atherosclerosis of native arteries of extremities with gangrene, right leg: Secondary | ICD-10-CM | POA: Diagnosis not present

## 2020-05-15 DIAGNOSIS — J449 Chronic obstructive pulmonary disease, unspecified: Secondary | ICD-10-CM | POA: Diagnosis not present

## 2020-05-15 DIAGNOSIS — Z743 Need for continuous supervision: Secondary | ICD-10-CM | POA: Diagnosis not present

## 2020-05-15 DIAGNOSIS — R404 Transient alteration of awareness: Secondary | ICD-10-CM | POA: Diagnosis not present

## 2020-05-15 DIAGNOSIS — E1152 Type 2 diabetes mellitus with diabetic peripheral angiopathy with gangrene: Principal | ICD-10-CM | POA: Diagnosis present

## 2020-05-15 DIAGNOSIS — Z7401 Bed confinement status: Secondary | ICD-10-CM | POA: Diagnosis not present

## 2020-05-15 DIAGNOSIS — Z6822 Body mass index (BMI) 22.0-22.9, adult: Secondary | ICD-10-CM | POA: Diagnosis not present

## 2020-05-15 DIAGNOSIS — L89310 Pressure ulcer of right buttock, unstageable: Secondary | ICD-10-CM | POA: Insufficient documentation

## 2020-05-15 DIAGNOSIS — Z7984 Long term (current) use of oral hypoglycemic drugs: Secondary | ICD-10-CM

## 2020-05-15 DIAGNOSIS — K219 Gastro-esophageal reflux disease without esophagitis: Secondary | ICD-10-CM | POA: Diagnosis not present

## 2020-05-15 DIAGNOSIS — F17210 Nicotine dependence, cigarettes, uncomplicated: Secondary | ICD-10-CM | POA: Diagnosis present

## 2020-05-15 DIAGNOSIS — K2950 Unspecified chronic gastritis without bleeding: Secondary | ICD-10-CM | POA: Diagnosis not present

## 2020-05-15 DIAGNOSIS — L97919 Non-pressure chronic ulcer of unspecified part of right lower leg with unspecified severity: Secondary | ICD-10-CM | POA: Diagnosis not present

## 2020-05-15 DIAGNOSIS — R5381 Other malaise: Secondary | ICD-10-CM | POA: Diagnosis not present

## 2020-05-15 DIAGNOSIS — I700 Atherosclerosis of aorta: Secondary | ICD-10-CM | POA: Diagnosis not present

## 2020-05-15 DIAGNOSIS — L89153 Pressure ulcer of sacral region, stage 3: Secondary | ICD-10-CM | POA: Diagnosis not present

## 2020-05-15 DIAGNOSIS — I70229 Atherosclerosis of native arteries of extremities with rest pain, unspecified extremity: Secondary | ICD-10-CM | POA: Diagnosis present

## 2020-05-15 LAB — CBC WITH DIFFERENTIAL/PLATELET
Abs Immature Granulocytes: 0.05 10*3/uL (ref 0.00–0.07)
Basophils Absolute: 0 10*3/uL (ref 0.0–0.1)
Basophils Relative: 0 %
Eosinophils Absolute: 0.2 10*3/uL (ref 0.0–0.5)
Eosinophils Relative: 2 %
HCT: 28.8 % — ABNORMAL LOW (ref 39.0–52.0)
Hemoglobin: 8.3 g/dL — ABNORMAL LOW (ref 13.0–17.0)
Immature Granulocytes: 0 %
Lymphocytes Relative: 11 %
Lymphs Abs: 1.3 10*3/uL (ref 0.7–4.0)
MCH: 22.6 pg — ABNORMAL LOW (ref 26.0–34.0)
MCHC: 28.8 g/dL — ABNORMAL LOW (ref 30.0–36.0)
MCV: 78.5 fL — ABNORMAL LOW (ref 80.0–100.0)
Monocytes Absolute: 0.6 10*3/uL (ref 0.1–1.0)
Monocytes Relative: 5 %
Neutro Abs: 10.3 10*3/uL — ABNORMAL HIGH (ref 1.7–7.7)
Neutrophils Relative %: 82 %
Platelets: 596 10*3/uL — ABNORMAL HIGH (ref 150–400)
RBC: 3.67 MIL/uL — ABNORMAL LOW (ref 4.22–5.81)
RDW: 20.2 % — ABNORMAL HIGH (ref 11.5–15.5)
WBC: 12.5 10*3/uL — ABNORMAL HIGH (ref 4.0–10.5)
nRBC: 0 % (ref 0.0–0.2)

## 2020-05-15 LAB — PROTIME-INR
INR: 1.1 (ref 0.8–1.2)
Prothrombin Time: 13.8 seconds (ref 11.4–15.2)

## 2020-05-15 LAB — COMPREHENSIVE METABOLIC PANEL
ALT: 22 U/L (ref 0–44)
AST: 20 U/L (ref 15–41)
Albumin: 2.2 g/dL — ABNORMAL LOW (ref 3.5–5.0)
Alkaline Phosphatase: 134 U/L — ABNORMAL HIGH (ref 38–126)
Anion gap: 9 (ref 5–15)
BUN: 14 mg/dL (ref 8–23)
CO2: 28 mmol/L (ref 22–32)
Calcium: 8.6 mg/dL — ABNORMAL LOW (ref 8.9–10.3)
Chloride: 99 mmol/L (ref 98–111)
Creatinine, Ser: 0.71 mg/dL (ref 0.61–1.24)
GFR calc Af Amer: >60 mL/min (ref >60)
GFR calc non Af Amer: >60 mL/min (ref >60)
Glucose, Bld: 141 mg/dL — ABNORMAL HIGH (ref 70–99)
Potassium: 3.4 mmol/L — ABNORMAL LOW (ref 3.5–5.1)
Sodium: 136 mmol/L (ref 135–145)
Total Bilirubin: 0.3 mg/dL (ref 0.3–1.2)
Total Protein: 6.6 g/dL (ref 6.5–8.1)

## 2020-05-15 LAB — SARS CORONAVIRUS 2 BY RT PCR (HOSPITAL ORDER, PERFORMED IN ~~LOC~~ HOSPITAL LAB): SARS Coronavirus 2: NEGATIVE

## 2020-05-15 LAB — CBG MONITORING, ED: Glucose-Capillary: 165 mg/dL — ABNORMAL HIGH (ref 70–99)

## 2020-05-15 IMAGING — DX DG TIBIA/FIBULA 2V*R*
4 series · 4 of 4 positions shown · non-contrast
Comparison: None.

CLINICAL DATA: Gangrenous foot

EXAM:
RIGHT TIBIA AND FIBULA - 2 VIEW

[tibia ap (1 of 2)]
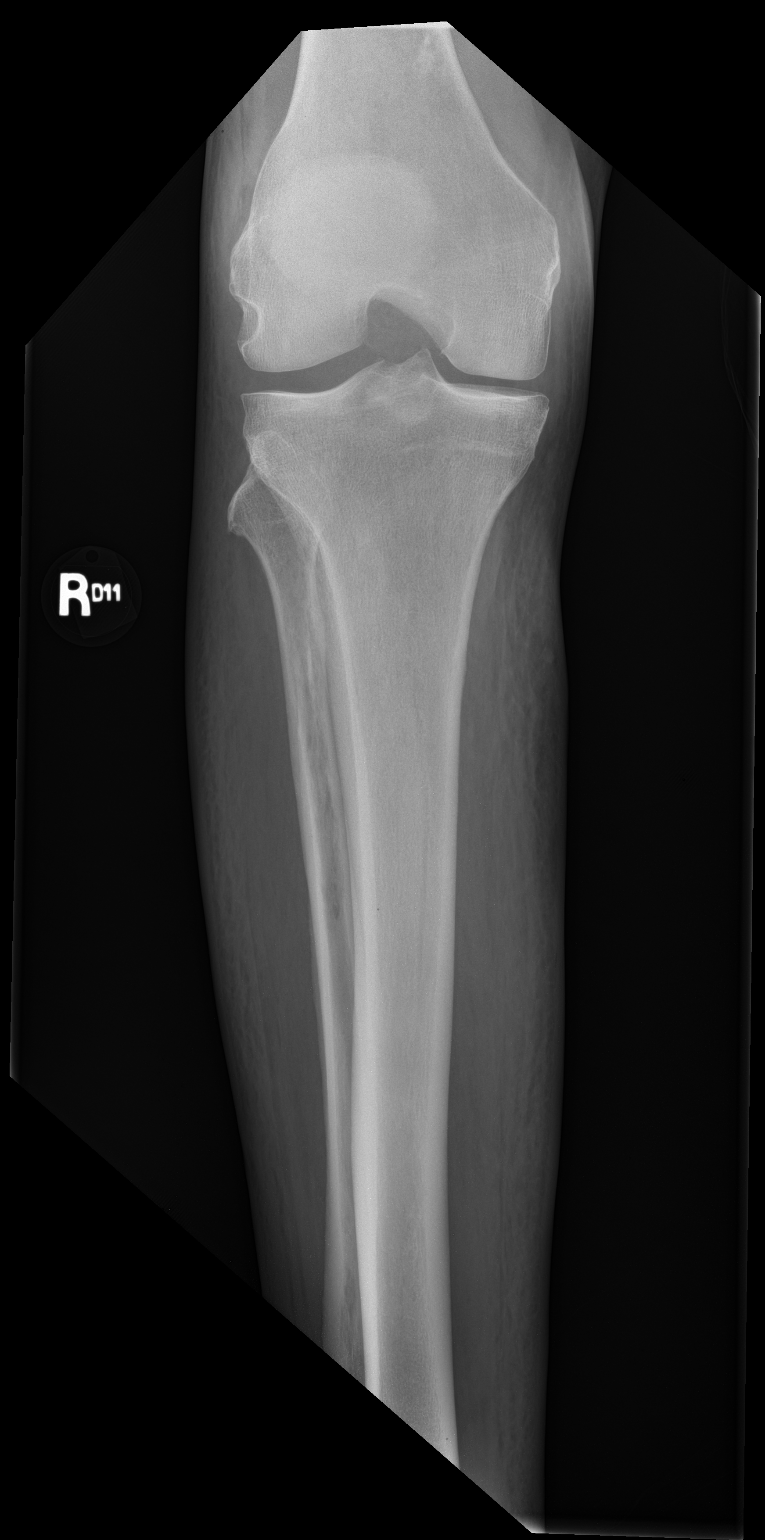

[tibia ap (2 of 2)]
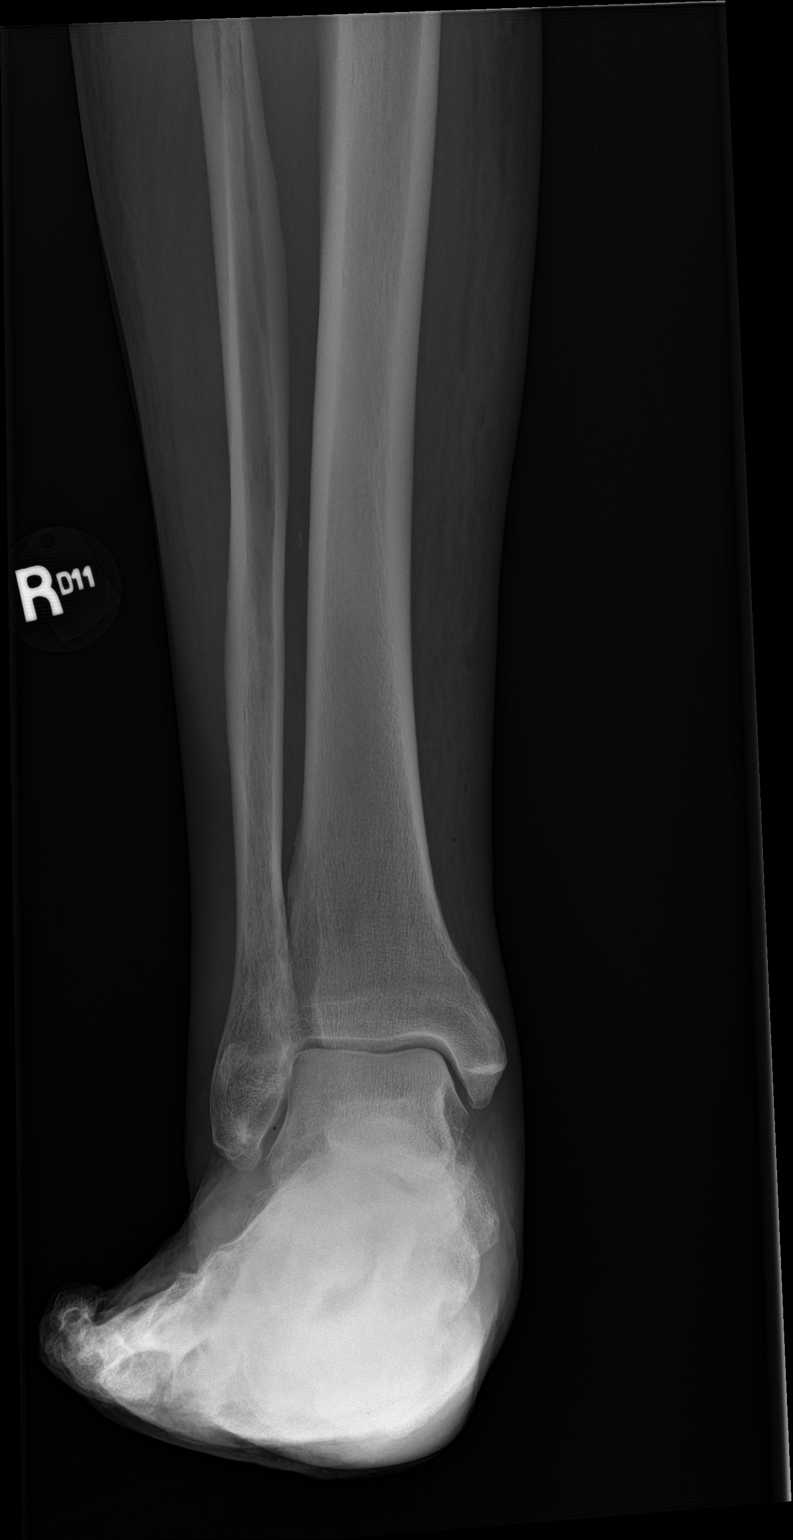

[tibia lat (1 of 2)]
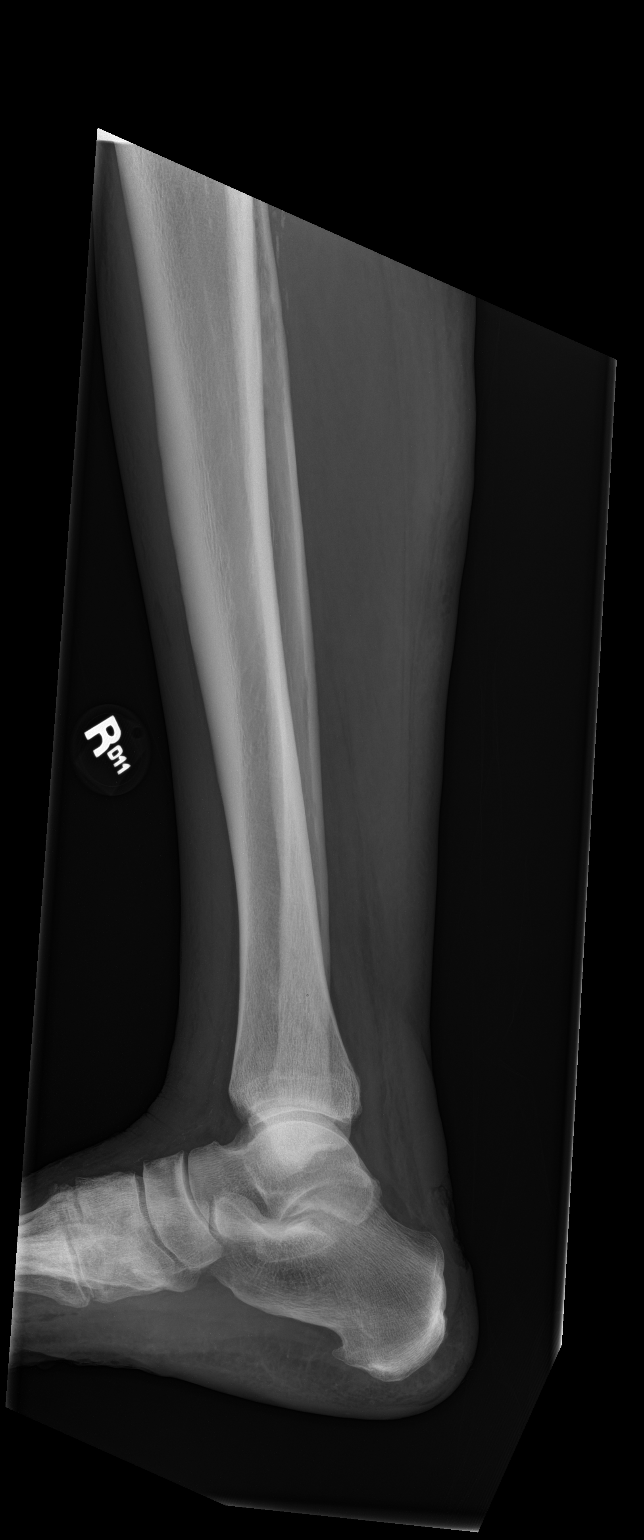

[tibia lat (2 of 2)]
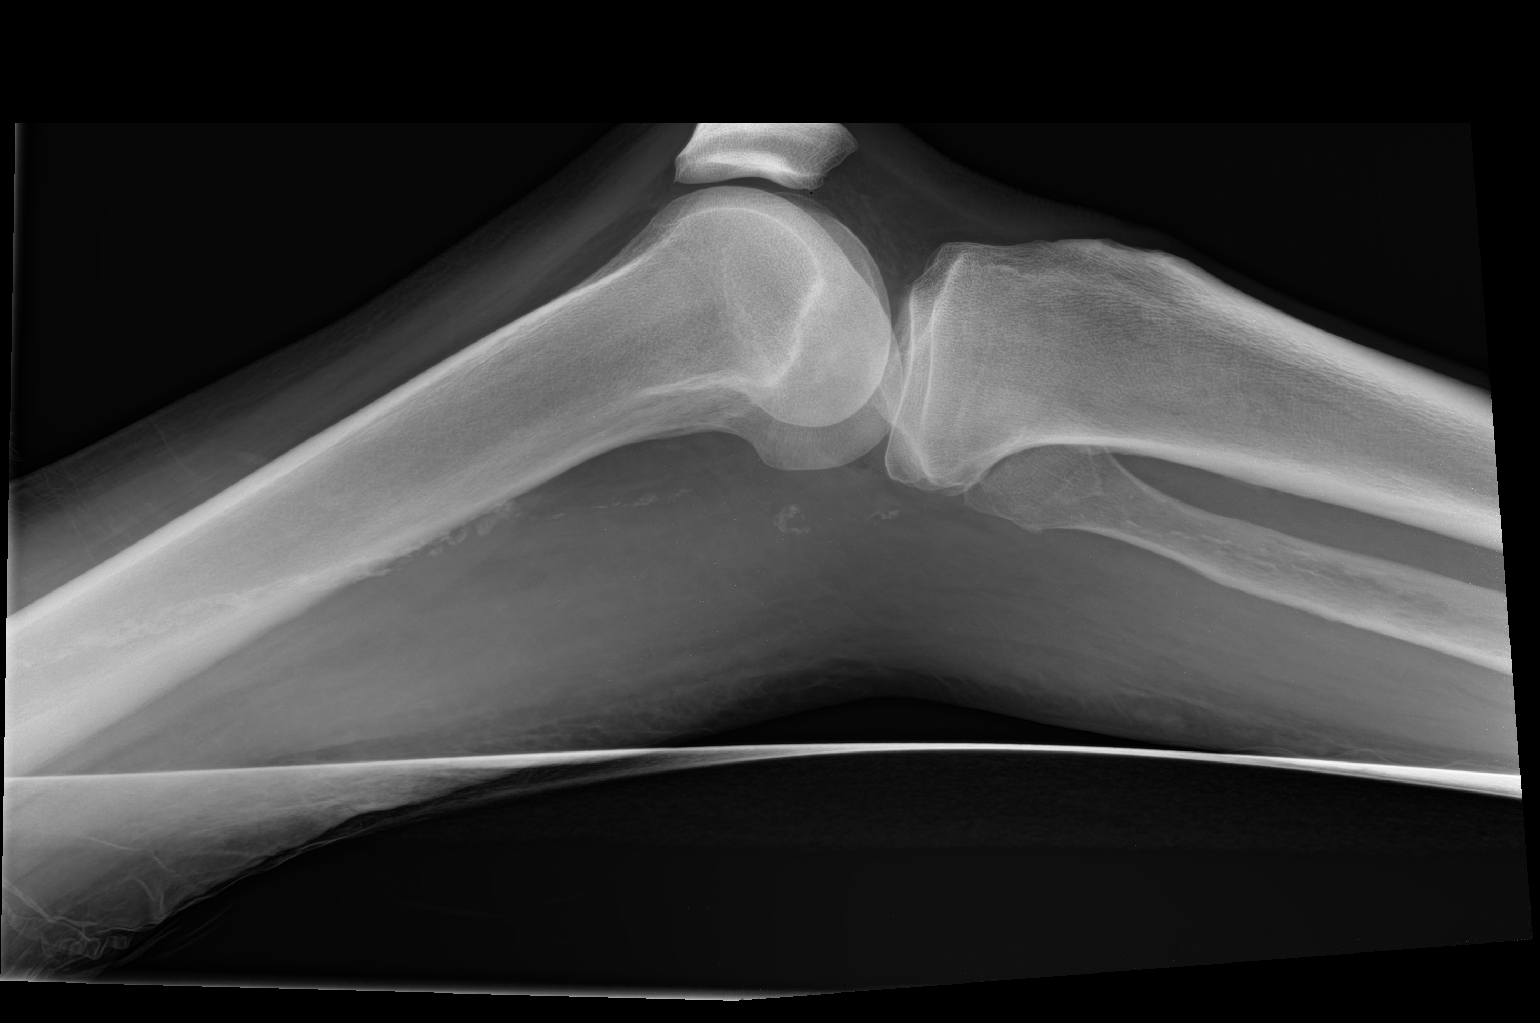

[4 of 4 positions shown; findings below may reference images not displayed]

FINDINGS: No acute displaced fracture or malalignment. No periostitis or frank
bone destruction. Diffuse soft tissue swelling.
IMPRESSION: No acute osseous abnormality. Diffuse soft tissue swelling.

## 2020-05-15 IMAGING — DX DG FOOT COMPLETE 3+V*R*
3 series · 3 of 3 positions shown · non-contrast
Comparison: Radiograph dated [DATE].

CLINICAL DATA: 68-year-old male with right foot necrosis.

EXAM:
RIGHT FOOT COMPLETE - 3+ VIEW

[foot ap]
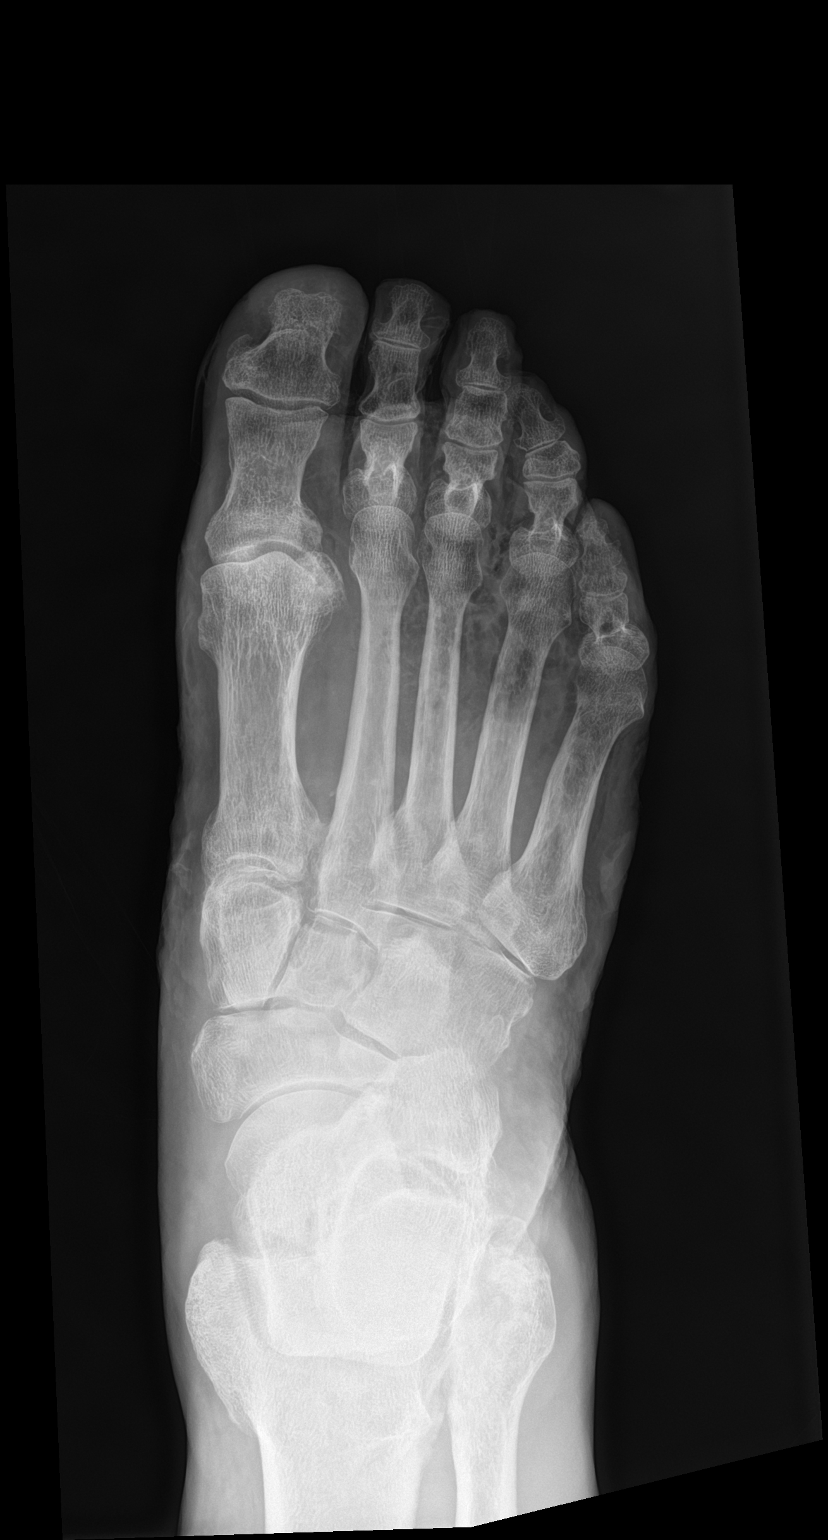

[foot obl]
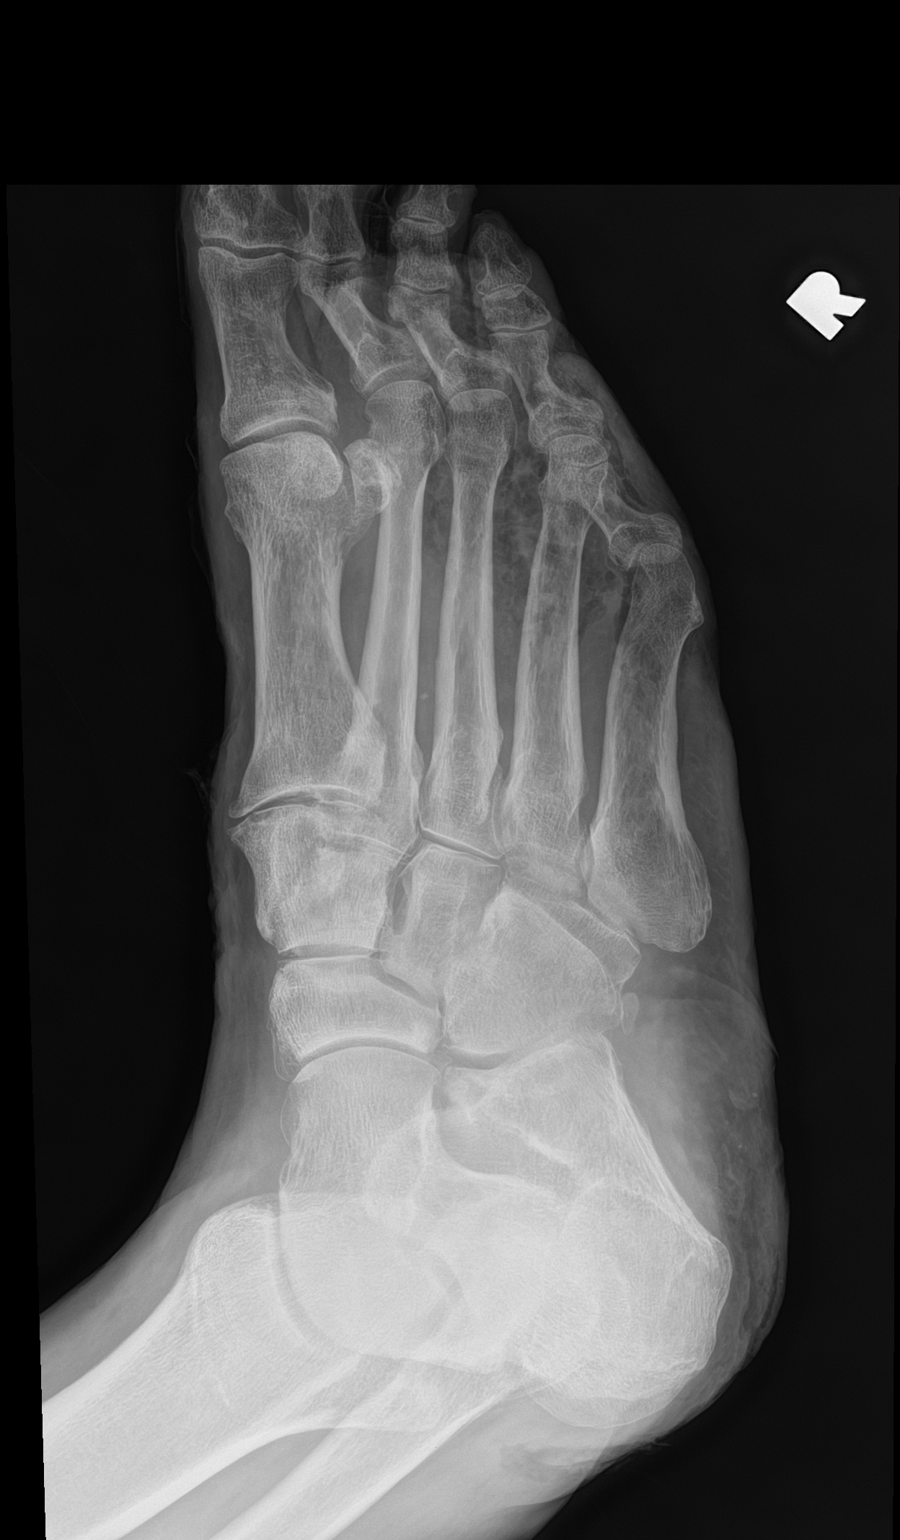

[foot lat]
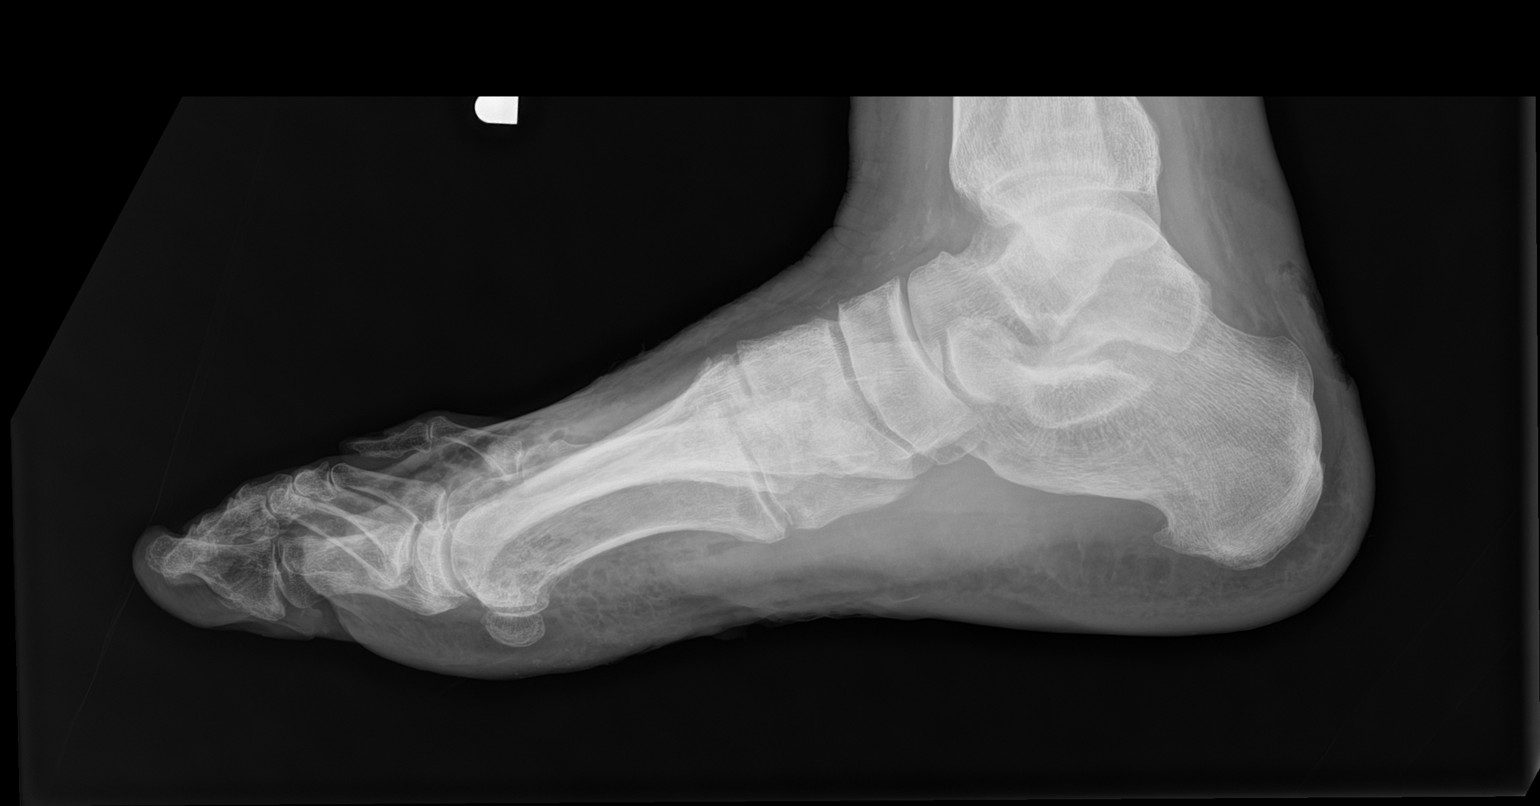

[3 of 3 positions shown; findings below may reference images not displayed]

FINDINGS: There is no acute fracture or dislocation. The bones are osteopenic.
No periosteal elevation or bone erosion. There is diffuse soft
tissue air predominantly involving the forefoot consistent with
necrotizing fasciitis. Clinical correlation is recommended. A skin
wound noted along the dorsal aspect of the calcaneus.
IMPRESSION: 1. No acute fracture or dislocation.
2. Diffuse soft tissue air predominantly involving the forefoot
consistent with gangrenous changes.

## 2020-05-15 IMAGING — DX DG CHEST 2V
2 series · 2 of 2 positions shown · non-contrast
Comparison: [DATE]

CLINICAL DATA: RIGHT foot wound, injury in [REDACTED], gangrenous
foot, history diabetes mellitus, hypertension, COPD

EXAM:
CHEST - 2 VIEW

[chest lat]
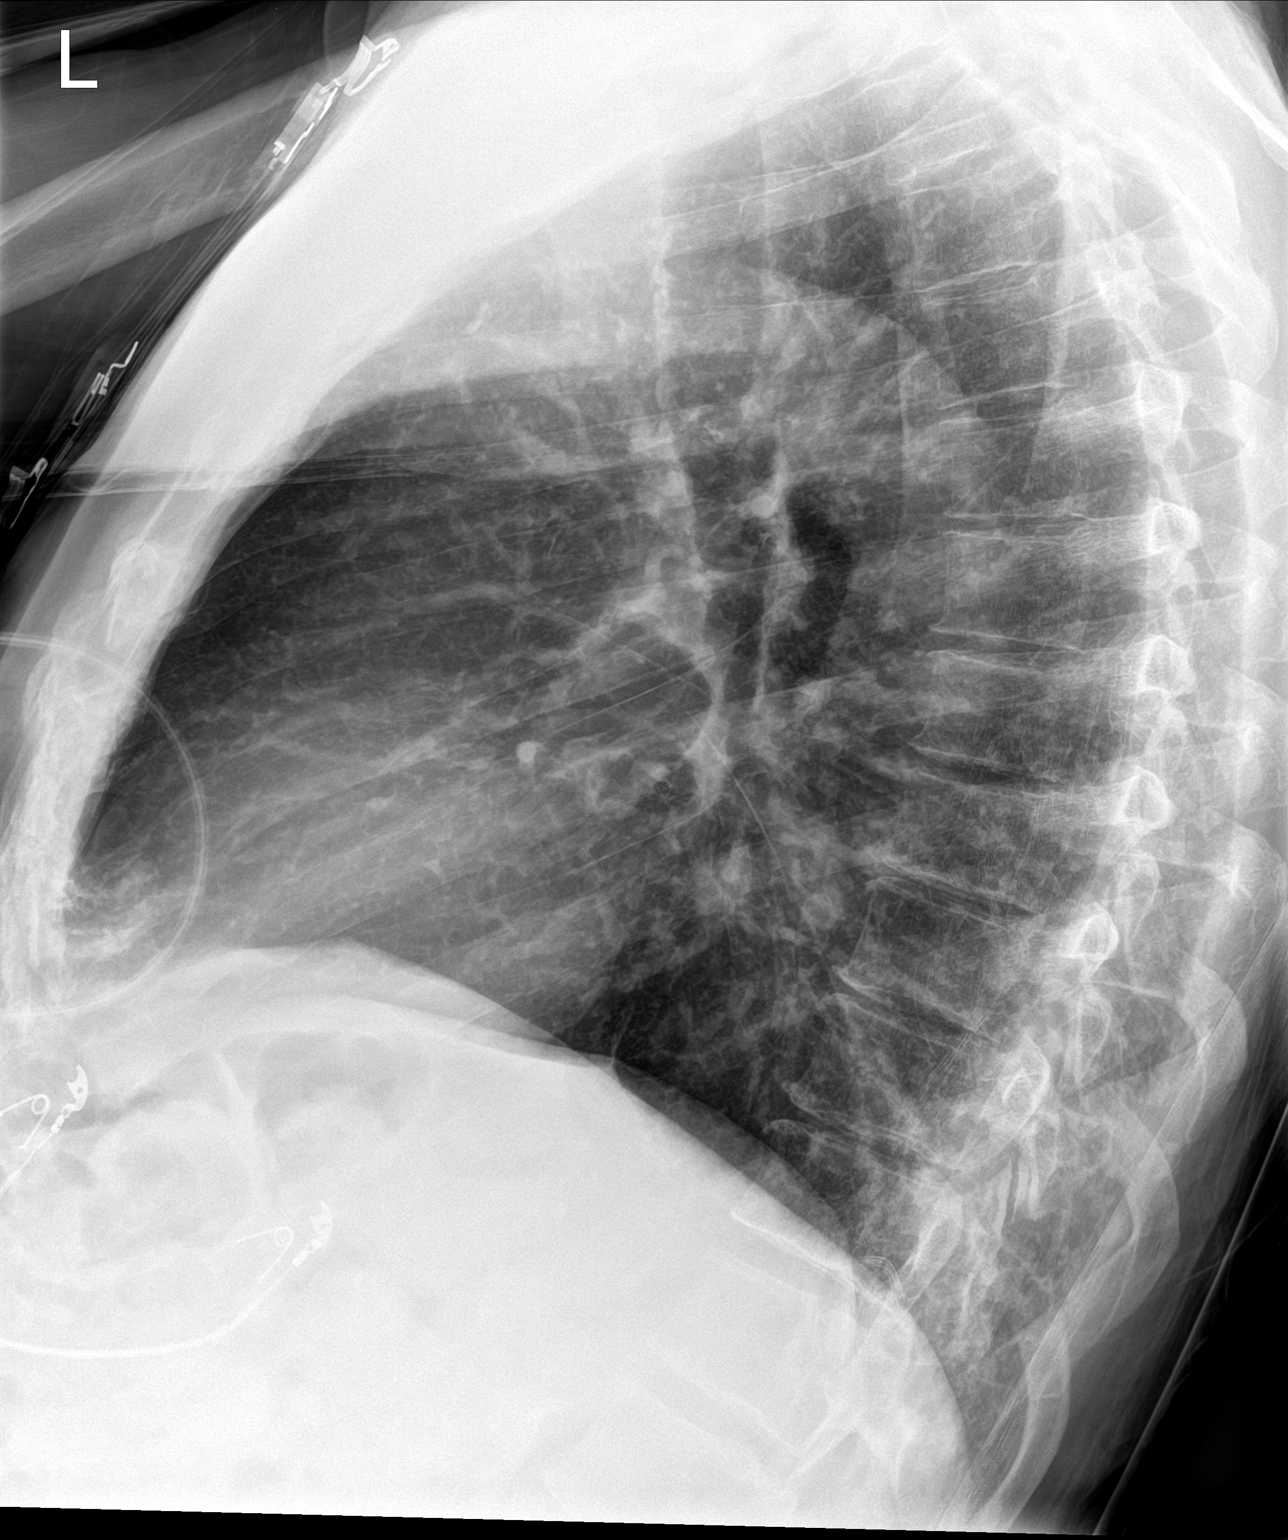

[chest ap]
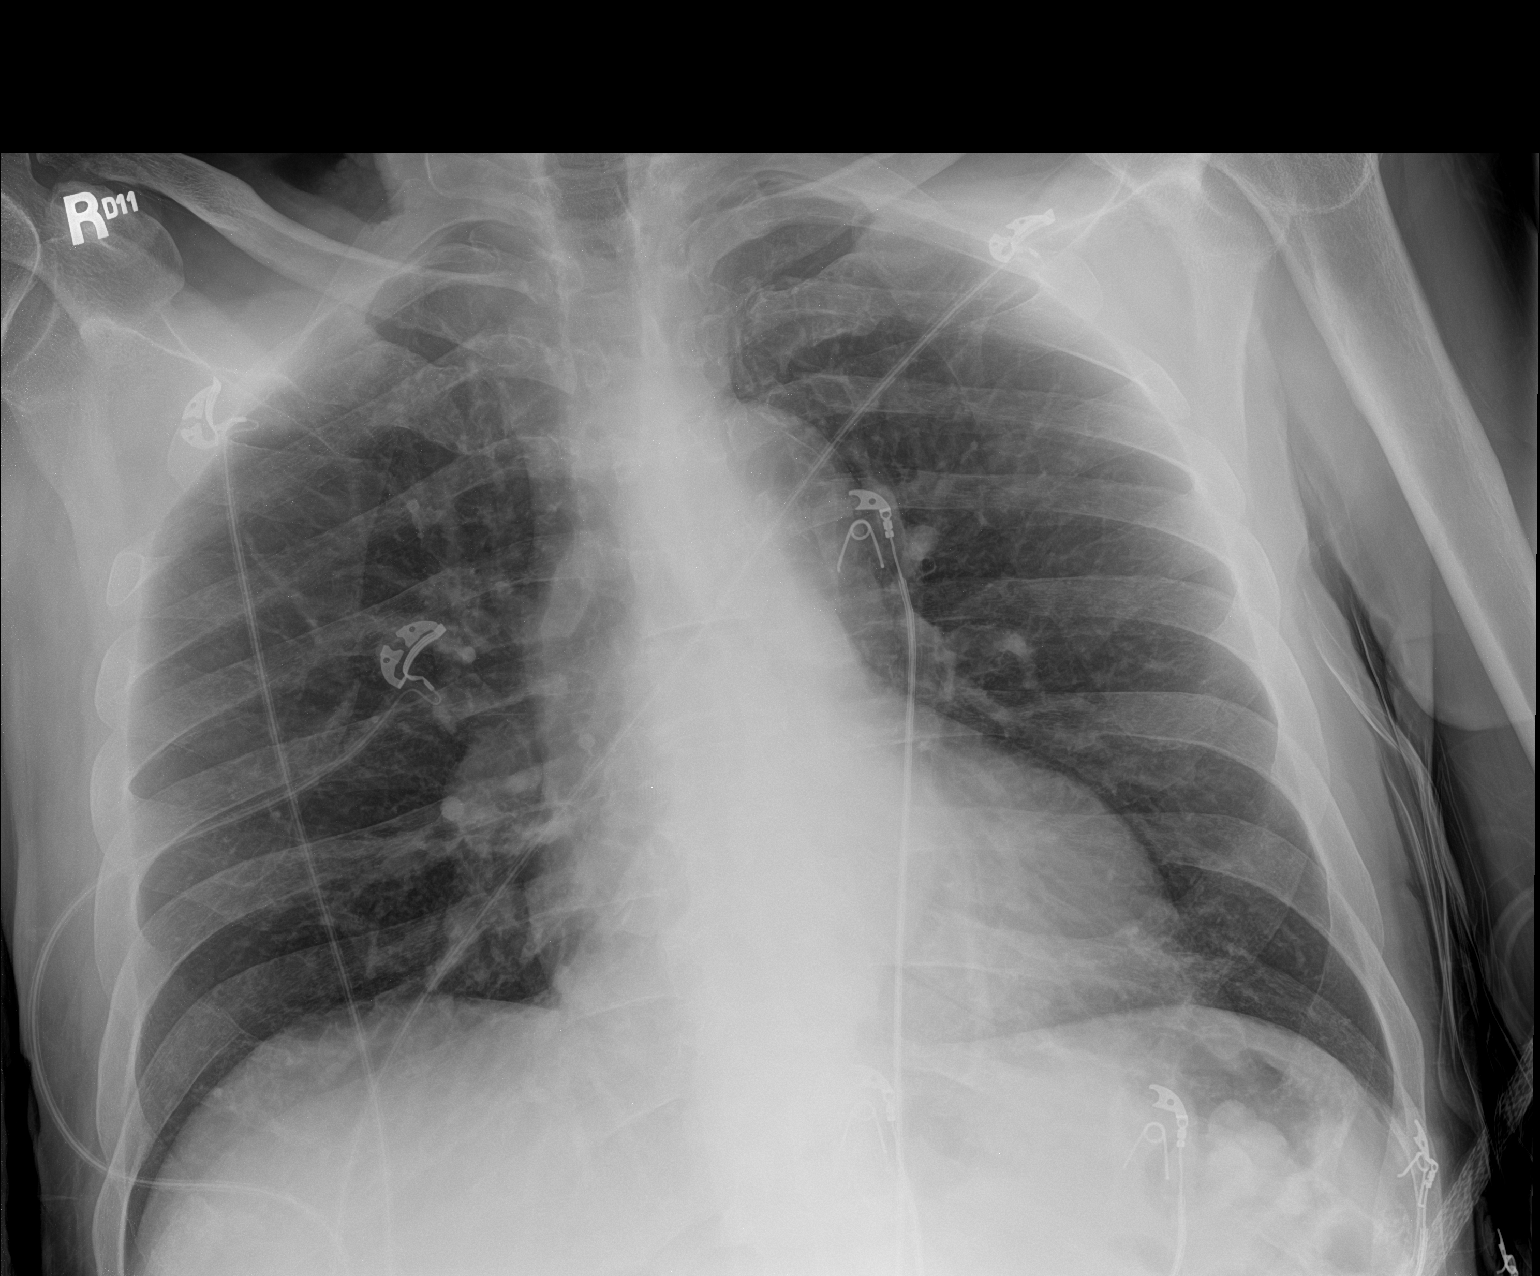

[2 of 2 positions shown; findings below may reference images not displayed]

FINDINGS: Normal heart size, mediastinal contours, and pulmonary vascularity.

Atherosclerotic calcification aorta.

Lungs clear.

No pulmonary infiltrate, pleural effusion, or pneumothorax.

Osseous structures unremarkable.
IMPRESSION: No acute abnormalities.

## 2020-05-15 MED ORDER — SODIUM CHLORIDE 0.9 % IV SOLN
1.0000 g | Freq: Three times a day (TID) | INTRAVENOUS | Status: AC
  Administered 2020-05-15 – 2020-05-18 (×9): 1 g via INTRAVENOUS
  Filled 2020-05-15: qty 1
  Filled 2020-05-15: qty 0.03
  Filled 2020-05-15 (×9): qty 1

## 2020-05-15 MED ORDER — VANCOMYCIN HCL 2000 MG/400ML IV SOLN
2000.0000 mg | Freq: Once | INTRAVENOUS | Status: AC
  Administered 2020-05-15: 2000 mg via INTRAVENOUS
  Filled 2020-05-15: qty 400

## 2020-05-15 MED ORDER — INSULIN ASPART 100 UNIT/ML ~~LOC~~ SOLN
0.0000 [IU] | Freq: Every day | SUBCUTANEOUS | Status: DC
  Administered 2020-05-19: 4 [IU] via SUBCUTANEOUS

## 2020-05-15 MED ORDER — INSULIN ASPART 100 UNIT/ML ~~LOC~~ SOLN
0.0000 [IU] | Freq: Three times a day (TID) | SUBCUTANEOUS | Status: DC
  Administered 2020-05-16 (×2): 3 [IU] via SUBCUTANEOUS
  Administered 2020-05-16 – 2020-05-17 (×2): 5 [IU] via SUBCUTANEOUS
  Administered 2020-05-17: 3 [IU] via SUBCUTANEOUS
  Administered 2020-05-17: 5 [IU] via SUBCUTANEOUS
  Administered 2020-05-18: 3 [IU] via SUBCUTANEOUS
  Administered 2020-05-18: 5 [IU] via SUBCUTANEOUS
  Administered 2020-05-18: 3 [IU] via SUBCUTANEOUS
  Administered 2020-05-19: 2 [IU] via SUBCUTANEOUS
  Administered 2020-05-19: 5 [IU] via SUBCUTANEOUS
  Administered 2020-05-19: 3 [IU] via SUBCUTANEOUS
  Administered 2020-05-20: 5 [IU] via SUBCUTANEOUS
  Administered 2020-05-20: 3 [IU] via SUBCUTANEOUS

## 2020-05-15 MED ORDER — VANCOMYCIN HCL 1250 MG/250ML IV SOLN
1250.0000 mg | Freq: Two times a day (BID) | INTRAVENOUS | Status: AC
  Administered 2020-05-16 – 2020-05-18 (×6): 1250 mg via INTRAVENOUS
  Filled 2020-05-15 (×6): qty 250

## 2020-05-15 MED ORDER — PANTOPRAZOLE SODIUM 40 MG PO TBEC
40.0000 mg | DELAYED_RELEASE_TABLET | Freq: Two times a day (BID) | ORAL | Status: DC
  Administered 2020-05-15 – 2020-05-20 (×11): 40 mg via ORAL
  Filled 2020-05-15 (×11): qty 1

## 2020-05-15 NOTE — ED Notes (Signed)
Patient left via [facility] at this time

## 2020-05-15 NOTE — H&P (Signed)
TRH H&P   Patient Demographics:    Omar Todd, is a 69 y.o. male  MRN: [id number]   DOB - [birth year]  Admit Date - 05/15/2020  Outpatient Primary MD for the patient is [name], DO  Referring MD/NP/PA: Dr. Bobby Rumpf  Outpatient Specialists: GI Dr Gala Romney  Patient coming from: Home  Chief Complaint  Patient presents with  . Foot Injury    right      HPI:    Omar Todd  is a 69 y.o. male, with medical history significant ofCOPD, hypertension, hyperlipidemia, DM type 2, COPD, anemiasince hospitalization in April of this year secondary to acute GI bleed, secondary to chronic gastritis, from NSAIDs use, unsuccessful colonoscopy as unable to tolerate GoLYTELY, patient then left AMA prior evaluation by vascular surgery(vascular surgery awaiting GI clearance prior to intervention), and apparently patient with worsening gangrene, recently seen by GI Dr. Buford Dresser in [place], unsuccessful colonoscopy, due to poor bowel prep, patient was brought to ED by his sister due to worsening right lower foot ischemia and gangrene, this has been progressive issue for few month, he had poor follow-up with podiatry as an outpatient despite multiple attempts to contact him, patient denies fever, chills, chest pain, shortness of breath, melena or hematochezia. - in ED patient to have mild leukocytosis 5, ED physician discussed with vascular Dr. Scot Dock reported patient need to be transferred to [facility], for further evaluation by vascular, and likely he will eventually need BKA or AKA, hemoglobin at baseline 8.3, v afebrile, triage hospitalist consulted to admit.    Review of systems:    In addition to the HPI above,  No Fever-chills, No Headache, No changes with Vision or hearing, No problems swallowing food or Liquids, No Chest pain, Cough or Shortness of Breath, No Abdominal pain, No  Nausea or Vommitting, Bowel movements are regular, No Blood in stool or Urine, No dysuria, No new skin rashes or bruises, Worsening right lower extremity gangrene No new weakness, tingling, numbness in any extremity, No recent weight gain or loss, No polyuria, polydypsia or polyphagia, No significant Mental Stressors.  A full 10 point Review of Systems was done, except as stated above, all other Review of Systems were negative.   With Past History of the following :    Past Medical History:  Diagnosis Date  . COPD (chronic obstructive pulmonary disease) ([place])   . Diabetes mellitus without complication ([place])   . Gangrene of toe of right foot ([place])   . GERD (gastroesophageal reflux disease)   . Hyperlipidemia   . Hypertension       Past Surgical History:  Procedure Laterality Date  . BIOPSY  03/13/2020   Procedure: BIOPSY;  Surgeon: Daneil Dolin, MD;  [facility];  Service: Endoscopy;;  gastric biopsy  . ESOPHAGOGASTRODUODENOSCOPY N/A 03/13/2020   Procedure: ESOPHAGOGASTRODUODENOSCOPY (EGD);  Surgeon: Daneil Dolin, MD;  Location:  [facility];  Service: Endoscopy;  Laterality: N/A;  . FLEXIBLE SIGMOIDOSCOPY N/A 05/06/2020   Procedure: FLEXIBLE SIGMOIDOSCOPY;  Surgeon: Daneil Dolin, MD;  Location: [facility];  Service: Endoscopy;  Laterality: N/A;  . Thumb surgery        Social History:     Social History   Tobacco Use  . Smoking status: Current Every Day Smoker    Packs/day: 0.50    Years: 60.00    Pack years: 30.00    Types: Cigarettes  . Smokeless tobacco: Never Used  Substance Use Topics  . Alcohol use: No    Alcohol/week: 0.0 standard drinks       Family History :     Family History  Problem Relation Age of Onset  . Cancer Father   . Diabetes Sister   . Stroke Brother   . Healthy Sister   . Healthy Sister   . Colon cancer Neg Hx   . Gastric cancer Neg Hx       Home Medications:   Prior to Admission medications     Medication Sig Start Date End Date Taking? Authorizing Provider  amLODipine (NORVASC) 10 MG tablet Take 1 tablet (10 mg total) by mouth daily. 03/14/20  Yes Roxan Hockey, MD  aspirin EC 81 MG tablet Take 1 tablet (81 mg total) by mouth daily with breakfast. 03/14/20  Yes Emokpae, Courage, MD  BIOTIN 5000 PO Take 5,000 mcg by mouth daily.    Yes [provider]  cholecalciferol (VITAMIN D) 1000 UNITS tablet Take 1,000 Units by mouth daily.   Yes [provider]  empagliflozin (JARDIANCE) 25 MG TABS tablet Take 25 mg by mouth daily before breakfast. 03/14/20  Yes Emokpae, Courage, MD  escitalopram (LEXAPRO) 10 MG tablet Take 1 tablet (10 mg total) by mouth daily. 03/14/20  Yes Roxan Hockey, MD  ferrous sulfate 325 (65 FE) MG tablet Take 1 tablet (325 mg total) by mouth daily with breakfast. 03/14/20  Yes Emokpae, Courage, MD  hydrochlorothiazide (HYDRODIURIL) 25 MG tablet Take 1 tablet (25 mg total) by mouth daily. 03/14/20  Yes Emokpae, Courage, MD  lisinopril (ZESTRIL) 40 MG tablet TAKE ONE (1) TABLET EACH DAY Patient taking differently: Take 40 mg by mouth daily.  03/14/20  Yes Roxan Hockey, MD  metFORMIN (GLUCOPHAGE) 1000 MG tablet Take 1 tablet (1,000 mg total) by mouth 2 (two) times daily with a meal. 03/14/20  Yes Emokpae, Courage, MD  metoprolol tartrate (LOPRESSOR) 25 MG tablet Take 1 tablet (25 mg total) by mouth 2 (two) times daily. 03/14/20  Yes Emokpae, Courage, MD  montelukast (SINGULAIR) 10 MG tablet TAKE ONE TABLET DAILY AT BEDTIME Patient taking differently: Take 10 mg by mouth at bedtime.  01/07/20  Yes Gottschalk, Ashly M, DO  pantoprazole (PROTONIX) 40 MG tablet Take 1 tablet (40 mg total) by mouth daily. 03/15/20  Yes Roxan Hockey, MD  pravastatin (PRAVACHOL) 40 MG tablet Take 1 tablet (40 mg total) by mouth every evening. 03/14/20  Yes Emokpae, Courage, MD  [name] (JANUVIA) 100 MG tablet Take 1 tablet (100 mg total) by mouth daily. 03/14/20  Yes  Emokpae, Courage, MD  cephALEXin (KEFLEX) 500 MG capsule Take 1 capsule (500 mg total) by mouth 3 (three) times daily. Patient not taking: Reported on 05/15/2020 04/17/20   Sharion Balloon, FNP  collagenase (SANTYL) ointment Apply 1 application topically daily. Wound Measurements:  Right heel - 1.5 x 0.1cm Patient taking differently: Apply 1 application topically daily.  03/27/20  Evelina Bucy, DPM  mupirocin cream (BACTROBAN) 2 % Apply 1 application topically 2 (two) times daily. 02/05/20   Volanda Napoleon, PA-C     Allergies:         Allergies  Allergen Reactions  . Penicillins Other (See Comments)    Pt. States he "passed out"     Physical Exam:   Vitals  Blood pressure (!) 146/83, pulse 80, temperature 98.4 F (36.9 C), resp. rate (!) 26, height 6\' 2"  (1.88 m), weight 90 kg, SpO2 91 %.   1. General frail elderly male lying in bed in NAD,    2. Normal affect and insight, Not Suicidal or Homicidal, Awake Alert, Oriented X 3.  3. No F.N deficits, ALL C.Nerves Intact, Strength 5/5 all 4 extremities, Sensation intact all 4 extremities, Plantars down going.  4. Ears and Eyes appear Normal, Conjunctivae clear, PERRLA. Moist Oral Mucosa.  5. Supple Neck, No JVD, No cervical lymphadenopathy appriciated, No Carotid Bruits.  6. Symmetrical Chest wall movement, Good air movement bilaterally, CTAB.  7. RRR, No Gallops, Rubs,+ Murmurs, No Parasternal Heave.  8. Positive Bowel Sounds, Abdomen Soft, No tenderness, No organomegaly appriciated,No rebound -guarding or rigidity.  9.  Please see pictures below, patient with +2 bilateral lower extremity edema, and right foot dry gangrene distally, and with gangrene proximally.  Patient with diminished pulses bilaterally  10. Good muscle tone,  joints appear normal , no effusions, Normal ROM.  11. No Palpable Lymph Nodes in Neck or Axillae      Data Review:    CBC Recent Labs  Lab 05/15/20 1920  WBC 12.5*  HGB 8.3*  HCT  28.8*  PLT 596*  MCV 78.5*  MCH 22.6*  MCHC 28.8*  RDW 20.2*  LYMPHSABS 1.3  MONOABS 0.6  EOSABS 0.2  BASOSABS 0.0   ------------------------------------------------------------------------------------------------------------------  Chemistries  Recent Labs  Lab 05/15/20 1920  NA 136  K 3.4*  CL 99  CO2 28  GLUCOSE 141*  BUN 14  CREATININE 0.71  CALCIUM 8.6*  AST 20  ALT 22  ALKPHOS 134*  BILITOT 0.3   ------------------------------------------------------------------------------------------------------------------ estimated creatinine clearance is 101.3 mL/min (by C-G formula based on SCr of 0.71 mg/dL). ------------------------------------------------------------------------------------------------------------------ No results for input(s): TSH, T4TOTAL, T3FREE, THYROIDAB in the last 72 hours.  Invalid input(s): FREET3  Coagulation profile Recent Labs  Lab 05/15/20 1920  INR 1.1   ------------------------------------------------------------------------------------------------------------------- No results for input(s): DDIMER in the last 72 hours. -------------------------------------------------------------------------------------------------------------------  Cardiac Enzymes No results for input(s): CKMB, TROPONINI, MYOGLOBIN in the last 168 hours.  Invalid input(s): CK ------------------------------------------------------------------------------------------------------------------ No results found for: BNP   ---------------------------------------------------------------------------------------------------------------  Urinalysis    Component Value Date/Time   APPEARANCEUR Clear 08/08/2018 1119   GLUCOSEU Negative 08/08/2018 1119   BILIRUBINUR Negative 08/08/2018 1119   PROTEINUR Negative 08/08/2018 1119   UROBILINOGEN negative 10/31/2014 1002   NITRITE Negative 08/08/2018 1119   LEUKOCYTESUR Negative 08/08/2018 1119     ----------------------------------------------------------------------------------------------------------------   Imaging Results:    DG Chest 2 View  Result Date: 05/15/2020 CLINICAL DATA:  RIGHT foot wound, injury in January, gangrenous foot, history diabetes mellitus, hypertension, COPD EXAM: CHEST - 2 VIEW COMPARISON:  09/21/2017 FINDINGS: Normal heart size, mediastinal contours, and pulmonary vascularity. Atherosclerotic calcification aorta. Lungs clear. No pulmonary infiltrate, pleural effusion, or pneumothorax. Osseous structures unremarkable. IMPRESSION: No acute abnormalities. Electronically Signed   By: Lavonia Dana M.D.   On: 05/15/2020 20:06   DG Tibia/Fibula Right  Result Date: 05/15/2020 CLINICAL DATA:  Gangrenous  foot EXAM: RIGHT TIBIA AND FIBULA - 2 VIEW COMPARISON:  None. FINDINGS: No acute displaced fracture or malalignment. No periostitis or frank bone destruction. Diffuse soft tissue swelling. IMPRESSION: No acute osseous abnormality. Diffuse soft tissue swelling. Electronically Signed   By: Donavan Foil M.D.   On: 05/15/2020 20:06   DG Foot Complete Right  Result Date: 05/15/2020 CLINICAL DATA:  69 year old male with right foot necrosis. EXAM: RIGHT FOOT COMPLETE - 3+ VIEW COMPARISON:  Radiograph dated 02/13/2020. FINDINGS: There is no acute fracture or dislocation. The bones are osteopenic. No periosteal elevation or bone erosion. There is diffuse soft tissue air predominantly involving the forefoot consistent with necrotizing fasciitis. Clinical correlation is recommended. A skin wound noted along the dorsal aspect of the calcaneus. IMPRESSION: 1. No acute fracture or dislocation. 2. Diffuse soft tissue air predominantly involving the forefoot consistent with gangrenous changes. Electronically Signed   By: Anner Crete M.D.   On: 05/15/2020 18:10    My personal review of EKG: Rhythm NSR, Rate  79 /min, QTc 465   Assessment & Plan:    Active Problems:   Diabetic  neuropathy, type II diabetes mellitus ([place])   Hyperlipidemia associated with type 2 diabetes mellitus ([place])   Hypertension associated with diabetes ([place])   Diabetes mellitus type 2 controlled   BPH (benign prostatic hyperplasia)   AAA (abdominal aortic aneurysm) without rupture ([facility])   Critical lower limb ischemia   Anemia   Gangrene ([facility])   PAD (peripheral artery disease) ([facility])   critical limb ischemia/right foot gangrene -Patient with dry/wet gangrene of right foot, with diminished pulses, will start empirically on IV vancomycin and meropenem for now, will obtain blood cultures. -Patient will need evaluation by vascular surgery given peripheral arterial disease bilaterally, and certainly will need revascularization addressed prior to amputation, patient will be transferred to [facility] for vascular surgery evaluation. -Patient left AMA during his recent hospitalization in April of this year before work-up was done.  Microcytic anemia -Patient with questionable history of GI bleed, endoscopy in April this year significant for chronic gastritis, colonoscopy 6/16 was aborted given poor bowel prep. -Patient will need colonoscopy done prior to intervention his lower extremity and revascularization to rule out GI bleed. -We will keep on clear liquid diet, will have morning team consult GI to perform colonoscopy prior to revascularization/amputation.  AAA -Seen in most recent CT angio on 03/12/2020, this is to be evaluated by [facility] at [facility].  Diabetes mellitus -Hold oral regimen, will start on insulin sliding scale during hospital stay.  Hypertension -Continue with home medications  COPD and ongoing tobacco abuse -No wheezing, no hypoxia  Mild to moderate aortic stenosis -echo from 03/13/2020 noted,EF is preserved at 50 to 55%   DVT Prophylaxis Heparin   AM Labs Ordered, also please review Full Orders  Family Communication: Admission, patients condition  and plan of care including tests being ordered have been discussed with the patient and sister who indicate understanding and agree with the plan and Code Status.  Code Status Full  Likely DC to  :pending further work up.  Condition GUARDED    Consults called: vascular by ED  Admission status:   INPATIENT  Time spent in minutes : 65 MINUTES   Phillips Climes M.D on 05/15/2020 at 9:30 PM   [facility] - Office  [phone]

## 2020-05-15 NOTE — ED Triage Notes (Signed)
Patient comes to the ED with right foot wound after an injury in January.  Patient reports that he has not had this wound evaluated.

## 2020-05-15 NOTE — ED Notes (Signed)
Attempted to call report-no answer 

## 2020-05-15 NOTE — Progress Notes (Addendum)
Pharmacy Antibiotic Note  Omar Todd is a 69 y.o. male admitted on 05/15/2020 with cellulitis.  Pharmacy has been consulted for Meropenem and Vancomycin dosing.  He has a creatinine of 0.71 and an estimated crcl is > 131ml/min.  He currently is without fever and WBC = 12.5.  Per MD, his foot has ongoing gangrene which started back in January.  He will likely require an amputation.  Plan: Vancomycin load - 2gm x 1 Vancomycin 1250 IV every 12 hours.  Goal trough 10-15 mcg/mL.  Meropenem 1gm IV every 8 hours Monitor renal function, clinical response and plans for de-escalation.  Height: 6\' 2"  (188 cm) Weight: 90 kg (198 lb 6.6 oz) IBW/kg (Calculated) : 82.2  Temp (24hrs), Avg:98.4 F (36.9 C), Min:98.4 F (36.9 C), Max:98.4 F (36.9 C)  Recent Labs  Lab 05/15/20 1920  WBC 12.5*  CREATININE 0.71    Estimated Creatinine Clearance: 101.3 mL/min (by C-G formula based on SCr of 0.71 mg/dL).    Allergies  Allergen Reactions  . Penicillins Other (See Comments)    Pt. States he "passed out"    Antimicrobials this admission: 6/25 Meropenem >>  6/25 Vancomycin  >>   Dose adjustments this admission:   Microbiology results:  Rober Minion, PharmD., MS Clinical Pharmacist Pager:  [phone] Thank you for allowing pharmacy to be part of this patients care team. 05/15/2020 9:41 PM

## 2020-05-15 NOTE — ED Notes (Signed)
Report attempted no answer x 2

## 2020-05-15 NOTE — ED Provider Notes (Addendum)
[facility] EMERGENCY DEPARTMENT Provider Note   CSN: [id number] Arrival date & time: 05/15/20  1533     History Chief Complaint  Patient presents with  . Foot Injury    right    Omar Todd is a 69 y.o. male.  Patient according to sister or family member with slowly developing gangrene from the foot starting back in January.  Had been followed by podiatry.  Chart review shows the podiatry was aware of the gangrenous foot and was trying to get him into the office but he was not coming in.  Family member was able to get him in here tonight.  Patient without any specific complaints.  Past medical history is significant for diabetes COPD hyperlipidemia hypertension peripheral artery disease and past listed as gangrene of toe of right foot.  Also some mention of an abdominal aortic aneurysm without rupture.        Past Medical History:  Diagnosis Date  . COPD (chronic obstructive pulmonary disease) ([place])   . Diabetes mellitus without complication ([place])   . Gangrene of toe of right foot ([place])   . GERD (gastroesophageal reflux disease)   . Hyperlipidemia   . Hypertension     Patient Active Problem List   Diagnosis Date Noted  . Gangrene ([place]) 03/26/2020  . PAD (peripheral artery disease) ([place]) 03/26/2020  . Anemia 03/12/2020  . GI bleed 03/12/2020  . AAA (abdominal aortic aneurysm) without rupture ([place]) 02/28/2020  . Critical lower limb ischemia 02/28/2020  . BPH (benign prostatic hyperplasia) 10/31/2014  . Systolic ejection murmur 75/64/3329  . Bilateral carotid bruits 10/31/2014  . Erectile dysfunction 11/05/2013  . Bilateral carotid artery disease ([place]) 04/11/2013  . Hyperlipidemia associated with type 2 diabetes mellitus ([place]) 04/11/2013  . Hypertension associated with diabetes ([place]) 04/11/2013  . COPD exacerbation ([place]) 04/11/2013  . Diabetes mellitus type 2 controlled 04/11/2013  . Diabetic neuropathy, type II diabetes mellitus ([place]) 03/15/2013    Past  Surgical History:  Procedure Laterality Date  . BIOPSY  03/13/2020   Procedure: BIOPSY;  Surgeon: Daneil Dolin, MD;  Location[facility];  Service: Endoscopy;;  gastric biopsy  . ESOPHAGOGASTRODUODENOSCOPY N/A 03/13/2020   Procedure: ESOPHAGOGASTRODUODENOSCOPY (EGD);  Surgeon: Daneil Dolin, MD;  Location: [facility];  Service: Endoscopy;  Laterality: N/A;  . FLEXIBLE SIGMOIDOSCOPY N/A 05/06/2020   Procedure: FLEXIBLE SIGMOIDOSCOPY;  Surgeon: Daneil Dolin, MD;  Location: [facility];  Service: Endoscopy;  Laterality: N/A;  . Thumb surgery         Family History  Problem Relation Age of Onset  . Cancer Father   . Diabetes Sister   . Stroke Brother   . Healthy Sister   . Healthy Sister   . Colon cancer Neg Hx   . Gastric cancer Neg Hx     Social History   Tobacco Use  . Smoking status: Current Every Day Smoker    Packs/day: 0.50    Years: 60.00    Pack years: 30.00    Types: Cigarettes  . Smokeless tobacco: Never Used  Vaping Use  . Vaping Use: Former  Substance Use Topics  . Alcohol use: No    Alcohol/week: 0.0 standard drinks  . Drug use: No    Home Medications Prior to Admission medications   Medication Sig Start Date End Date Taking? Authorizing Provider  ACCU-CHEK GUIDE test strip  04/02/20   [provider]  acetaminophen (TYLENOL) 500 MG tablet Take 1,000 mg by mouth 2 (  two) times daily as needed for moderate pain or headache.    [provider]  amLODipine (NORVASC) 10 MG tablet Take 1 tablet (10 mg total) by mouth daily. 03/14/20   Roxan Hockey, MD  aspirin EC 81 MG tablet Take 1 tablet (81 mg total) by mouth daily with breakfast. 03/14/20   Emokpae, Courage, MD  BIOTIN 5000 PO Take 5,000 mcg by mouth daily.     [provider]  cephALEXin (KEFLEX) 500 MG capsule Take 1 capsule (500 mg total) by mouth 3 (three) times daily. 04/17/20   Evelina Dun A, FNP  cholecalciferol (VITAMIN D) 1000 UNITS tablet Take 1,000 Units  by mouth daily.    [provider]  collagenase (SANTYL) ointment Apply 1 application topically daily. Wound Measurements:  Right heel - 1.5 x 0.1cm Patient taking differently: Apply 1 application topically daily.  03/27/20   Evelina Bucy, DPM  empagliflozin (JARDIANCE) 25 MG TABS tablet Take 25 mg by mouth daily before breakfast. 03/14/20   Emokpae, Courage, MD  escitalopram (LEXAPRO) 10 MG tablet Take 1 tablet (10 mg total) by mouth daily. 03/14/20   Roxan Hockey, MD  ferrous sulfate 325 (65 FE) MG tablet Take 1 tablet (325 mg total) by mouth daily with breakfast. 03/14/20   Denton Brick, Courage, MD  hydrochlorothiazide (HYDRODIURIL) 25 MG tablet Take 1 tablet (25 mg total) by mouth daily. 03/14/20   Roxan Hockey, MD  lisinopril (ZESTRIL) 40 MG tablet TAKE ONE (1) TABLET EACH DAY Patient taking differently: Take 40 mg by mouth daily.  03/14/20   Roxan Hockey, MD  metFORMIN (GLUCOPHAGE) 1000 MG tablet Take 1 tablet (1,000 mg total) by mouth 2 (two) times daily with a meal. 03/14/20   Emokpae, Courage, MD  metoprolol tartrate (LOPRESSOR) 25 MG tablet Take 1 tablet (25 mg total) by mouth 2 (two) times daily. 03/14/20   Emokpae, Courage, MD  montelukast (SINGULAIR) 10 MG tablet TAKE ONE TABLET DAILY AT BEDTIME Patient taking differently: Take 10 mg by mouth at bedtime.  01/07/20   [name], DO  mupirocin cream (BACTROBAN) 2 % Apply 1 application topically 2 (two) times daily. 02/05/20   Volanda Napoleon, PA-C  pantoprazole (PROTONIX) 40 MG tablet Take 1 tablet (40 mg total) by mouth daily. 03/15/20   Roxan Hockey, MD  pravastatin (PRAVACHOL) 40 MG tablet Take 1 tablet (40 mg total) by mouth every evening. 03/14/20   Emokpae, Courage, MD  [name] (JANUVIA) 100 MG tablet Take 1 tablet (100 mg total) by mouth daily. 03/14/20   Roxan Hockey, MD    Allergies    Penicillins  Review of Systems   Review of Systems  Constitutional: Negative for chills and fever.  HENT:  Negative for congestion, rhinorrhea and sore throat.   Eyes: Negative for visual disturbance.  Respiratory: Negative for cough and shortness of breath.   Cardiovascular: Negative for chest pain and leg swelling.  Gastrointestinal: Negative for abdominal pain, diarrhea, nausea and vomiting.  Genitourinary: Negative for dysuria.  Musculoskeletal: Negative for back pain and neck pain.  Skin: Positive for wound. Negative for rash.  Neurological: Positive for numbness. Negative for dizziness, light-headedness and headaches.  Hematological: Does not bruise/bleed easily.  Psychiatric/Behavioral: Negative for confusion.    Physical Exam Updated Vital Signs BP (!) 146/83   Pulse 80   Temp 98.4 F (36.9 C)   Resp (!) 26   Ht 1.88 m (6\' 2" )   Wt 90 kg   SpO2 91%   BMI 25.47  kg/m   Physical Exam Vitals and nursing note reviewed.  Constitutional:      Appearance: Normal appearance. He is well-developed.  HENT:     Head: Normocephalic and atraumatic.  Eyes:     Conjunctiva/sclera: Conjunctivae normal.     Pupils: Pupils are equal, round, and reactive to light.  Cardiovascular:     Rate and Rhythm: Normal rate and regular rhythm.     Heart sounds: No murmur heard.   Pulmonary:     Effort: Pulmonary effort is normal. No respiratory distress.     Breath sounds: Normal breath sounds.  Abdominal:     Palpations: Abdomen is soft.     Tenderness: There is no abdominal tenderness.  Musculoskeletal:        General: Swelling present.     Cervical back: Normal range of motion and neck supple.     Comments: Patient with gangrenous right foot.  Like gangrene to the distal forefoot and toes.  But with some more wet type a gangrenous changes more proximal in the foot as per pictures.  Swelling all the way up to the knee few scabs on the anterior shin.  Femoral pulse on the right side barely palpable good Doppler signal.  Popliteal pulse not palpable but has a Doppler signal.  No posterior tib  signal.  Skin:    General: Skin is warm and dry.  Neurological:     General: No focal deficit present.     Mental Status: He is alert and oriented to person, place, and time.     Cranial Nerves: No cranial nerve deficit.     Sensory: Sensory deficit present.     ED Results / Procedures / Treatments   Labs (all labs ordered are listed, but only abnormal results are displayed) Labs Reviewed  CBC WITH DIFFERENTIAL/PLATELET - Abnormal; Notable for the following components:      Result Value   WBC 12.5 (*)    RBC 3.67 (*)    Hemoglobin 8.3 (*)    HCT 28.8 (*)    MCV 78.5 (*)    MCH 22.6 (*)    MCHC 28.8 (*)    RDW 20.2 (*)    Platelets 596 (*)    Neutro Abs 10.3 (*)    All other components within normal limits  COMPREHENSIVE METABOLIC PANEL - Abnormal; Notable for the following components:   Potassium 3.4 (*)    Glucose, Bld 141 (*)    Calcium 8.6 (*)    Albumin 2.2 (*)    Alkaline Phosphatase 134 (*)    All other components within normal limits  SARS CORONAVIRUS 2 BY RT PCR [facility] ORDER, [place] LAB)  PROTIME-INR    EKG EKG Interpretation  Date/Time:  Friday May 15 2020 18:56:44 EDT Ventricular Rate:  79 PR Interval:    QRS Duration: 92 QT Interval:  405 QTC Calculation: 465 R Axis:   38 Text Interpretation: Sinus rhythm Nonspecific repol abnormality, diffuse leads No previous ECGs available Confirmed by Fredia Sorrow [phone]) on 05/15/2020 7:05:17 PM   Radiology DG Chest 2 View  Result Date: 05/15/2020 CLINICAL DATA:  RIGHT foot wound, injury in January, gangrenous foot, history diabetes mellitus, hypertension, COPD EXAM: CHEST - 2 VIEW COMPARISON:  09/21/2017 FINDINGS: Normal heart size, mediastinal contours, and pulmonary vascularity. Atherosclerotic calcification aorta. Lungs clear. No pulmonary infiltrate, pleural effusion, or pneumothorax. Osseous structures unremarkable. IMPRESSION: No acute abnormalities. Electronically Signed    By: Crist Infante.D.  On: 05/15/2020 20:06   DG Tibia/Fibula Right  Result Date: 05/15/2020 CLINICAL DATA:  Gangrenous foot EXAM: RIGHT TIBIA AND FIBULA - 2 VIEW COMPARISON:  None. FINDINGS: No acute displaced fracture or malalignment. No periostitis or frank bone destruction. Diffuse soft tissue swelling. IMPRESSION: No acute osseous abnormality. Diffuse soft tissue swelling. Electronically Signed   By: Donavan Foil M.D.   On: 05/15/2020 20:06   DG Foot Complete Right  Result Date: 05/15/2020 CLINICAL DATA:  69 year old male with right foot necrosis. EXAM: RIGHT FOOT COMPLETE - 3+ VIEW COMPARISON:  Radiograph dated 02/13/2020. FINDINGS: There is no acute fracture or dislocation. The bones are osteopenic. No periosteal elevation or bone erosion. There is diffuse soft tissue air predominantly involving the forefoot consistent with necrotizing fasciitis. Clinical correlation is recommended. A skin wound noted along the dorsal aspect of the calcaneus. IMPRESSION: 1. No acute fracture or dislocation. 2. Diffuse soft tissue air predominantly involving the forefoot consistent with gangrenous changes. Electronically Signed   By: Anner Crete M.D.   On: 05/15/2020 18:10              Procedures Procedures (including critical care time)  Medications Ordered in ED Medications - No data to display  ED Course  I have reviewed the triage vital signs and the nursing notes.  Pertinent labs & imaging results that were available during my care of the patient were reviewed by me and considered in my medical decision making (see chart for details).    MDM Rules/Calculators/A&P                         Combination of both wet and dry gangrene to the right foot.  Will discuss with vascular surgery.  Definite evidence of vascular insufficiency.  Doppler popliteal pulse.  Neatly palpable right femoral pulse.  No dorsalis pedis pulse  From chart review patient has been seen by cardiology and  podiatry in the past.  At 1 point had an appointment or procedure scheduled with Dr. Alvester Chou.  But it appears it did not occur.  Best I can tell no formal evaluation by vascular surgery.  Final Clinical Impression(s) / ED Diagnoses Final diagnoses:  Gangrene of right foot [facility])    Rx / DC Orders ED Discharge Orders    None       Fredia Sorrow, MD 05/15/20 2036    Fredia Sorrow, MD 05/15/20 2038  Discussed with Dr. Doren Custard vascular surgery.  He concurs that patient needs a BKA or AKA.  Admission to hospitalist service.  Most likely cannot be done here so transferred hospital service at [facility].  They can consult vascular surgery or orthopedics to do the amputation.      Fredia Sorrow, MD 05/15/20 [phone]

## 2020-05-16 ENCOUNTER — Encounter (HOSPITAL_COMMUNITY): Payer: Medicare Other

## 2020-05-16 ENCOUNTER — Encounter (HOSPITAL_COMMUNITY): Payer: Self-pay | Admitting: Internal Medicine

## 2020-05-16 DIAGNOSIS — E43 Unspecified severe protein-calorie malnutrition: Secondary | ICD-10-CM

## 2020-05-16 DIAGNOSIS — I714 Abdominal aortic aneurysm, without rupture: Secondary | ICD-10-CM

## 2020-05-16 DIAGNOSIS — I998 Other disorder of circulatory system: Secondary | ICD-10-CM

## 2020-05-16 DIAGNOSIS — I96 Gangrene, not elsewhere classified: Secondary | ICD-10-CM

## 2020-05-16 DIAGNOSIS — I739 Peripheral vascular disease, unspecified: Secondary | ICD-10-CM

## 2020-05-16 DIAGNOSIS — L89310 Pressure ulcer of right buttock, unstageable: Secondary | ICD-10-CM | POA: Insufficient documentation

## 2020-05-16 LAB — GLUCOSE, CAPILLARY
Glucose-Capillary: 175 mg/dL — ABNORMAL HIGH (ref 70–99)
Glucose-Capillary: 152 mg/dL — ABNORMAL HIGH (ref 70–99)
Glucose-Capillary: 241 mg/dL — ABNORMAL HIGH (ref 70–99)
Glucose-Capillary: 137 mg/dL — ABNORMAL HIGH (ref 70–99)

## 2020-05-16 LAB — CBC
HCT: 27.1 % — ABNORMAL LOW (ref 39.0–52.0)
Hemoglobin: 7.7 g/dL — ABNORMAL LOW (ref 13.0–17.0)
MCH: 22.1 pg — ABNORMAL LOW (ref 26.0–34.0)
MCHC: 28.4 g/dL — ABNORMAL LOW (ref 30.0–36.0)
MCV: 77.7 fL — ABNORMAL LOW (ref 80.0–100.0)
Platelets: 544 10*3/uL — ABNORMAL HIGH (ref 150–400)
RBC: 3.49 MIL/uL — ABNORMAL LOW (ref 4.22–5.81)
RDW: 20.1 % — ABNORMAL HIGH (ref 11.5–15.5)
WBC: 12 10*3/uL — ABNORMAL HIGH (ref 4.0–10.5)
nRBC: 0 % (ref 0.0–0.2)

## 2020-05-16 LAB — BASIC METABOLIC PANEL
Anion gap: 11 (ref 5–15)
BUN: 8 mg/dL (ref 8–23)
CO2: 27 mmol/L (ref 22–32)
Calcium: 8.5 mg/dL — ABNORMAL LOW (ref 8.9–10.3)
Chloride: 98 mmol/L (ref 98–111)
Creatinine, Ser: 0.76 mg/dL (ref 0.61–1.24)
GFR calc Af Amer: >60 mL/min (ref >60)
GFR calc non Af Amer: >60 mL/min (ref >60)
Glucose, Bld: 181 mg/dL — ABNORMAL HIGH (ref 70–99)
Potassium: 3.4 mmol/L — ABNORMAL LOW (ref 3.5–5.1)
Sodium: 136 mmol/L (ref 135–145)

## 2020-05-16 LAB — OCCULT BLOOD X 1 CARD TO LAB, STOOL: Fecal Occult Bld: NEGATIVE

## 2020-05-16 LAB — HEMOGLOBIN A1C
Hgb A1c MFr Bld: 8.3 % — ABNORMAL HIGH (ref 4.8–5.6)
Mean Plasma Glucose: 191.51 mg/dL

## 2020-05-16 LAB — MRSA PCR SCREENING: MRSA by PCR: NEGATIVE

## 2020-05-16 MED ORDER — AMLODIPINE BESYLATE 10 MG PO TABS
10.0000 mg | ORAL_TABLET | Freq: Every day | ORAL | Status: DC
  Administered 2020-05-16 – 2020-05-20 (×5): 10 mg via ORAL
  Filled 2020-05-16 (×5): qty 1

## 2020-05-16 MED ORDER — LISINOPRIL 40 MG PO TABS
40.0000 mg | ORAL_TABLET | Freq: Every day | ORAL | Status: DC
  Administered 2020-05-16 – 2020-05-20 (×5): 40 mg via ORAL
  Filled 2020-05-16 (×5): qty 1

## 2020-05-16 MED ORDER — HYDROCODONE-ACETAMINOPHEN 5-325 MG PO TABS
1.0000 | ORAL_TABLET | ORAL | Status: DC | PRN
  Administered 2020-05-16 – 2020-05-17 (×3): 1 via ORAL
  Filled 2020-05-16: qty 1
  Filled 2020-05-16: qty 2
  Filled 2020-05-16: qty 1

## 2020-05-16 MED ORDER — ACETAMINOPHEN 325 MG PO TABS: 650.0000 mg | ORAL_TABLET | Freq: Four times a day (QID) | ORAL | Status: DC | PRN

## 2020-05-16 MED ORDER — MONTELUKAST SODIUM 10 MG PO TABS
10.0000 mg | ORAL_TABLET | Freq: Every day | ORAL | Status: DC
  Administered 2020-05-16 – 2020-05-19 (×5): 10 mg via ORAL
  Filled 2020-05-16 (×5): qty 1

## 2020-05-16 MED ORDER — PANTOPRAZOLE SODIUM 40 MG PO TBEC: 40.0000 mg | DELAYED_RELEASE_TABLET | Freq: Every day | ORAL | Status: DC

## 2020-05-16 MED ORDER — METOPROLOL TARTRATE 25 MG PO TABS
25.0000 mg | ORAL_TABLET | Freq: Two times a day (BID) | ORAL | Status: DC
  Administered 2020-05-16 – 2020-05-20 (×10): 25 mg via ORAL
  Filled 2020-05-16 (×10): qty 1

## 2020-05-16 MED ORDER — FERROUS SULFATE 325 (65 FE) MG PO TABS
325.0000 mg | ORAL_TABLET | Freq: Every day | ORAL | Status: DC
  Administered 2020-05-16 – 2020-05-20 (×5): 325 mg via ORAL
  Filled 2020-05-16 (×5): qty 1

## 2020-05-16 MED ORDER — ESCITALOPRAM OXALATE 10 MG PO TABS
10.0000 mg | ORAL_TABLET | Freq: Every day | ORAL | Status: DC
  Administered 2020-05-16 – 2020-05-20 (×5): 10 mg via ORAL
  Filled 2020-05-16 (×5): qty 1

## 2020-05-16 MED ORDER — ACETAMINOPHEN 650 MG RE SUPP: 650.0000 mg | Freq: Four times a day (QID) | RECTAL | Status: DC | PRN

## 2020-05-16 MED ORDER — ORAL CARE MOUTH RINSE
15.0000 mL | Freq: Two times a day (BID) | OROMUCOSAL | Status: DC
  Administered 2020-05-16 – 2020-05-20 (×9): 15 mL via OROMUCOSAL

## 2020-05-16 MED ORDER — HYDROCHLOROTHIAZIDE 25 MG PO TABS
25.0000 mg | ORAL_TABLET | Freq: Every day | ORAL | Status: DC
  Administered 2020-05-16 – 2020-05-20 (×5): 25 mg via ORAL
  Filled 2020-05-16 (×5): qty 1

## 2020-05-16 MED ORDER — HEPARIN SODIUM (PORCINE) 5000 UNIT/ML IJ SOLN
5000.0000 [IU] | Freq: Three times a day (TID) | INTRAMUSCULAR | Status: DC
  Administered 2020-05-16 – 2020-05-20 (×13): 5000 [IU] via SUBCUTANEOUS
  Filled 2020-05-16 (×13): qty 1

## 2020-05-16 MED ORDER — PRAVASTATIN SODIUM 40 MG PO TABS
40.0000 mg | ORAL_TABLET | Freq: Every evening | ORAL | Status: DC
  Administered 2020-05-16 – 2020-05-19 (×4): 40 mg via ORAL
  Filled 2020-05-16 (×4): qty 1

## 2020-05-16 NOTE — Progress Notes (Signed)
[facility] notified that patient had 14 beat run of vtach bp 142/58 HR 79 97 % 2l [place] no c/o chest pain or shortness of breath asymptomatic. Arthor Captain LPN

## 2020-05-16 NOTE — H&P (View-Only) (Signed)
ORTHOPAEDIC CONSULTATION  REQUESTING PHYSICIAN: Bonnell Public, MD  Chief Complaint: Gangrene right foot.  HPI: Omar Todd is a 69 y.o. male who presents with chronic history of gangrene right foot.  Patient has a history of diabetes peripheral vascular disease and protein caloric malnutrition.  Past Medical History:  Diagnosis Date  . COPD (chronic obstructive pulmonary disease) ([place])   . Diabetes mellitus without complication ([place])   . Gangrene of toe of right foot ([place])   . GERD (gastroesophageal reflux disease)   . Hyperlipidemia   . Hypertension    Past Surgical History:  Procedure Laterality Date  . BIOPSY  03/13/2020   Procedure: BIOPSY;  Surgeon: Daneil Dolin, MD;  [facility];  Service: Endoscopy;;  gastric biopsy  . ESOPHAGOGASTRODUODENOSCOPY N/A 03/13/2020   Procedure: ESOPHAGOGASTRODUODENOSCOPY (EGD);  Surgeon: Daneil Dolin, MD;  [facility];  Service: Endoscopy;  Laterality: N/A;  . FLEXIBLE SIGMOIDOSCOPY N/A 05/06/2020   Procedure: FLEXIBLE SIGMOIDOSCOPY;  Surgeon: Daneil Dolin, MD;  [facility];  Service: Endoscopy;  Laterality: N/A;  . Thumb surgery     Social History   Socioeconomic History  . Marital status: Married    Spouse name: [name]  . Number of children: 3  . Years of education: 9th Grade  . Highest education level: 9th grade  Occupational History  . Occupation: Retired    Comment: Science writer  Tobacco Use  . Smoking status: Current Every Day Smoker    Packs/day: 0.50    Years: 60.00    Pack years: 30.00    Types: Cigarettes  . Smokeless tobacco: Never Used  Vaping Use  . Vaping Use: Former  Substance and Sexual Activity  . Alcohol use: No    Alcohol/week: 0.0 standard drinks  . Drug use: No  . Sexual activity: Yes    Birth control/protection: None  Other Topics Concern  . Not on file  Social History Narrative  . Not on file   Social Determinants of Health    Financial Resource Strain: Low Risk   . Difficulty of Paying Living Expenses: Not very hard  Food Insecurity: No Food Insecurity  . Worried About Charity fundraiser in the Last Year: Never true  . Ran Out of Food in the Last Year: Never true  Transportation Needs: No Transportation Needs  . Lack of Transportation (Medical): No  . Lack of Transportation (Non-Medical): No  Physical Activity: Inactive  . Days of Exercise per Week: 0 days  . Minutes of Exercise per Session: 0 min  Stress: No Stress Concern Present  . Feeling of Stress : Only a little  Social Connections: Socially Integrated  . Frequency of Communication with Friends and Family: More than three times a week  . Frequency of Social Gatherings with Friends and Family: More than three times a week  . Attends Religious Services: More than 4 times per year  . Active Member of Clubs or Organizations: Yes  . Attends Archivist Meetings: More than 4 times per year  . Marital Status: Married   Family History  Problem Relation Age of Onset  . Cancer Father   . Diabetes Sister   . Stroke Brother   . Healthy Sister   . Healthy Sister   . Colon cancer Neg Hx   . Gastric cancer Neg Hx    - negative except otherwise stated in the family history section Allergies  Allergen Reactions  . Penicillins  Other (See Comments)    Pt. States he "passed out"   Prior to Admission medications   Medication Sig Start Date End Date Taking? Authorizing Provider  amLODipine (NORVASC) 10 MG tablet Take 1 tablet (10 mg total) by mouth daily. 03/14/20  Yes Roxan Hockey, MD  aspirin EC 81 MG tablet Take 1 tablet (81 mg total) by mouth daily with breakfast. 03/14/20  Yes Emokpae, Courage, MD  BIOTIN 5000 PO Take 5,000 mcg by mouth daily.    Yes [provider]  cholecalciferol (VITAMIN D) 1000 UNITS tablet Take 1,000 Units by mouth daily.   Yes [provider]  empagliflozin (JARDIANCE) 25 MG TABS tablet Take 25 mg  by mouth daily before breakfast. 03/14/20  Yes Emokpae, Courage, MD  escitalopram (LEXAPRO) 10 MG tablet Take 1 tablet (10 mg total) by mouth daily. 03/14/20  Yes Roxan Hockey, MD  ferrous sulfate 325 (65 FE) MG tablet Take 1 tablet (325 mg total) by mouth daily with breakfast. 03/14/20  Yes Emokpae, Courage, MD  hydrochlorothiazide (HYDRODIURIL) 25 MG tablet Take 1 tablet (25 mg total) by mouth daily. 03/14/20  Yes Emokpae, Courage, MD  lisinopril (ZESTRIL) 40 MG tablet TAKE ONE (1) TABLET EACH DAY Patient taking differently: Take 40 mg by mouth daily.  03/14/20  Yes Roxan Hockey, MD  metFORMIN (GLUCOPHAGE) 1000 MG tablet Take 1 tablet (1,000 mg total) by mouth 2 (two) times daily with a meal. 03/14/20  Yes Emokpae, Courage, MD  metoprolol tartrate (LOPRESSOR) 25 MG tablet Take 1 tablet (25 mg total) by mouth 2 (two) times daily. 03/14/20  Yes Emokpae, Courage, MD  montelukast (SINGULAIR) 10 MG tablet TAKE ONE TABLET DAILY AT BEDTIME Patient taking differently: Take 10 mg by mouth at bedtime.  01/07/20  Yes Gottschalk, Ashly M, DO  pantoprazole (PROTONIX) 40 MG tablet Take 1 tablet (40 mg total) by mouth daily. 03/15/20  Yes Roxan Hockey, MD  pravastatin (PRAVACHOL) 40 MG tablet Take 1 tablet (40 mg total) by mouth every evening. 03/14/20  Yes Emokpae, Courage, MD  [name] (JANUVIA) 100 MG tablet Take 1 tablet (100 mg total) by mouth daily. 03/14/20  Yes Emokpae, Courage, MD  cephALEXin (KEFLEX) 500 MG capsule Take 1 capsule (500 mg total) by mouth 3 (three) times daily. Patient not taking: Reported on 05/15/2020 04/17/20   Sharion Balloon, FNP  collagenase (SANTYL) ointment Apply 1 application topically daily. Wound Measurements:  Right heel - 1.5 x 0.1cm Patient not taking: Reported on 05/15/2020 03/27/20   Evelina Bucy, DPM  mupirocin cream (BACTROBAN) 2 % Apply 1 application topically 2 (two) times daily. Patient not taking: Reported on 05/15/2020 02/05/20   Desma Mcgregor   DG  Chest 2 View  Result Date: 05/15/2020 CLINICAL DATA:  RIGHT foot wound, injury in January, gangrenous foot, history diabetes mellitus, hypertension, COPD EXAM: CHEST - 2 VIEW COMPARISON:  09/21/2017 FINDINGS: Normal heart size, mediastinal contours, and pulmonary vascularity. Atherosclerotic calcification aorta. Lungs clear. No pulmonary infiltrate, pleural effusion, or pneumothorax. Osseous structures unremarkable. IMPRESSION: No acute abnormalities. Electronically Signed   By: Lavonia Dana M.D.   On: 05/15/2020 20:06   DG Tibia/Fibula Right  Result Date: 05/15/2020 CLINICAL DATA:  Gangrenous foot EXAM: RIGHT TIBIA AND FIBULA - 2 VIEW COMPARISON:  None. FINDINGS: No acute displaced fracture or malalignment. No periostitis or frank bone destruction. Diffuse soft tissue swelling. IMPRESSION: No acute osseous abnormality. Diffuse soft tissue swelling. Electronically Signed   By: Donavan Foil M.D.   On:  05/15/2020 20:06   DG Foot Complete Right  Result Date: 05/15/2020 CLINICAL DATA:  69 year old male with right foot necrosis. EXAM: RIGHT FOOT COMPLETE - 3+ VIEW COMPARISON:  Radiograph dated 02/13/2020. FINDINGS: There is no acute fracture or dislocation. The bones are osteopenic. No periosteal elevation or bone erosion. There is diffuse soft tissue air predominantly involving the forefoot consistent with necrotizing fasciitis. Clinical correlation is recommended. A skin wound noted along the dorsal aspect of the calcaneus. IMPRESSION: 1. No acute fracture or dislocation. 2. Diffuse soft tissue air predominantly involving the forefoot consistent with gangrenous changes. Electronically Signed   By: Anner Crete M.D.   On: 05/15/2020 18:10   - pertinent xrays, CT, MRI studies were reviewed and independently interpreted  Positive ROS: All other systems have been reviewed and were otherwise negative with the exception of those mentioned in the HPI and as above.  Physical Exam: General: Alert, no acute  distress Psychiatric: Patient is competent for consent with normal mood and affect Lymphatic: No axillary or cervical lymphadenopathy Cardiovascular: No pedal edema Respiratory: No cyanosis, no use of accessory musculature GI: No organomegaly, abdomen is soft and non-tender    Images:  @ENCIMAGES @  Labs:  Lab Results  Component Value Date   HGBA1C 7.9 (H) 03/13/2020   HGBA1C 8.7 (H) 12/09/2019   HGBA1C 7.9 (H) 08/21/2019   ESRSEDRATE 54 (H) 03/13/2020   CRP 5.8 (H) 03/13/2020   REPTSTATUS PENDING 05/15/2020   CULT  05/15/2020    NO GROWTH < 12 HOURS Performed at [facility], [address], [place], [place] [address]     Lab Results  Component Value Date   ALBUMIN 2.2 (L) 05/15/2020   ALBUMIN 3.8 02/05/2020   ALBUMIN 4.4 08/08/2018    Neurologic: Patient does not have protective sensation bilateral lower extremities.   MUSCULOSKELETAL:   Skin: Examination patient has dry gangrene involving the entire foot with gangrene extending to the hindfoot.  Patient does not have a palpable pulse.  Albumin 2.2.,  Hemoglobin A1c 7.9.  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  Plan: We will plan for a right transtibial amputation tomorrow Sunday morning risks and benefits were discussed including risk of the wound not healing.  Patient states he understands wishes to proceed at this time.  Thank you for the consult and the opportunity to see Mr. Omar Todd, [place] [phone] 9:18 AM

## 2020-05-16 NOTE — Consult Note (Signed)
REASON FOR CONSULT:    Gangrene of the right foot.  The consult is requested by [facility].  ASSESSMENT & PLAN:   GANGRENE RIGHT FOOT: The patient's right foot is clearly not salvageable.  He has full-thickness necrosis in the forefoot and also on the lateral aspect of the right heel.  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  I think this would be associated with an approximately 15% risk of nonhealing.  Dr. Sharol Given has been consulted.  4.7 CM INFRARENAL ABDOMINAL AORTIC ANEURYSM: The patient has a 4.7 cm infrarenal abdominal aortic aneurysm.  In a normal risk patient we would consider elective repair at 5.5 cm.  We have discussed the importance of tobacco cessation.  I will arrange a follow-up ultrasound in my office in 6 months.  MULTILEVEL ARTERIAL OCCLUSIVE DISEASE: Based on his exam and CT angiogram he has multilevel arterial occlusive disease.  He will require amputation on the right side.  We will need to follow the left side closely.  Currently has no wounds and reasonable Doppler signals in the left foot.  BILATERAL CAROTID BRUITS: He has asymptomatic carotid bruits.  I have ordered a carotid duplex scan.   Omar Mayo, MD Office: [phone]   HPI:   Omar Todd is a pleasant 69 y.o. male, who was transferred from [facility] with gangrene of the right foot.  The patient is a poor historian and it is not clear how long he has had the wound.  He tells me that he fell back in January and that is when the wound began to develop.  He does describe claudication in both calves.  I do not get any clear-cut history of rest pain.  His risk factors for peripheral vascular disease include diabetes, hypertension, hypercholesterolemia, and tobacco use.  He smokes 1 pack/day and has been smoking for as long as he can remember.  He denies any family  history of premature cardiovascular disease.  He is on aspirin and is on a statin.  He is not on any other blood thinners.  He denies any history of stroke, TIAs, expressive or receptive aphasia, or amaurosis fugax.  Past Medical History:  Diagnosis Date  . COPD (chronic obstructive pulmonary disease) ([place])   . Diabetes mellitus without complication ([place])   . Gangrene of toe of right foot ([place])   . GERD (gastroesophageal reflux disease)   . Hyperlipidemia   . Hypertension     Family History  Problem Relation Age of Onset  . Cancer Father   . Diabetes Sister   . Stroke Brother   . Healthy Sister   . Healthy Sister   . Colon cancer Neg Hx   . Gastric cancer Neg Hx     SOCIAL HISTORY: Social History   Socioeconomic History  . Marital status: Married    Spouse name: [name]  . Number of children: 3  . Years of education: 9th Grade  . Highest education level: 9th grade  Occupational History  . Occupation: Retired    Comment: Science writer  Tobacco Use  . Smoking status: Current Every Day Smoker    Packs/day: 0.50    Years: 60.00    Pack years: 30.00    Types: Cigarettes  . Smokeless tobacco: Never Used  Vaping Use  . Vaping Use: Former  Substance and Sexual Activity  . Alcohol use: No  Alcohol/week: 0.0 standard drinks  . Drug use: No  . Sexual activity: Yes    Birth control/protection: None  Other Topics Concern  . Not on file  Social History Narrative  . Not on file   Social Determinants of Health   Financial Resource Strain: Low Risk   . Difficulty of Paying Living Expenses: Not very hard  Food Insecurity: No Food Insecurity  . Worried About Charity fundraiser in the Last Year: Never true  . Ran Out of Food in the Last Year: Never true  Transportation Needs: No Transportation Needs  . Lack of Transportation (Medical): No  . Lack of Transportation (Non-Medical): No  Physical Activity: Inactive  . Days of Exercise per Week: 0 days  .  Minutes of Exercise per Session: 0 min  Stress: No Stress Concern Present  . Feeling of Stress : Only a little  Social Connections: Socially Integrated  . Frequency of Communication with Friends and Family: More than three times a week  . Frequency of Social Gatherings with Friends and Family: More than three times a week  . Attends Religious Services: More than 4 times per year  . Active Member of Clubs or Organizations: Yes  . Attends Archivist Meetings: More than 4 times per year  . Marital Status: Married  Human resources officer Violence: Not At Risk  . Fear of Current or Ex-Partner: No  . Emotionally Abused: No  . Physically Abused: No  . Sexually Abused: No    Allergies  Allergen Reactions  . Penicillins Other (See Comments)    Pt. States he "passed out"    Current Facility-Administered Medications  Medication Dose Route Frequency Provider Last Rate Last Admin  . acetaminophen (TYLENOL) tablet 650 mg  650 mg Oral Q6H PRN Elgergawy, Silver Huguenin, MD       Or  . acetaminophen (TYLENOL) suppository 650 mg  650 mg Rectal Q6H PRN Elgergawy, Silver Huguenin, MD      . amLODipine (NORVASC) tablet 10 mg  10 mg Oral Daily Elgergawy, Silver Huguenin, MD      . escitalopram (LEXAPRO) tablet 10 mg  10 mg Oral Daily Elgergawy, Dawood S, MD      . ferrous sulfate tablet 325 mg  325 mg Oral Q breakfast Elgergawy, Silver Huguenin, MD   325 mg at 05/16/20 0830  . heparin injection 5,000 Units  5,000 Units Subcutaneous Q8H Elgergawy, Silver Huguenin, MD   5,000 Units at 05/16/20 0520  . hydrochlorothiazide (HYDRODIURIL) tablet 25 mg  25 mg Oral Daily Elgergawy, Silver Huguenin, MD      . HYDROcodone-acetaminophen (NORCO/VICODIN) 5-325 MG per tablet 1-2 tablet  1-2 tablet Oral Q4H PRN Elgergawy, Silver Huguenin, MD      . insulin aspart (novoLOG) injection 0-15 Units  0-15 Units Subcutaneous TID WC Elgergawy, Silver Huguenin, MD   3 Units at 05/16/20 [phone]  . insulin aspart (novoLOG) injection 0-5 Units  0-5 Units Subcutaneous QHS Elgergawy,  Dawood S, MD      . lisinopril (ZESTRIL) tablet 40 mg  40 mg Oral Daily Elgergawy, Silver Huguenin, MD      . MEDLINE mouth rinse  15 mL Mouth Rinse BID Elgergawy, Silver Huguenin, MD      . meropenem (MERREM) 1 g in sodium chloride 0.9 % 100 mL IVPB  1 g Intravenous Q8H Rober Minion F, RPH 200 mL/hr at 05/16/20 0520 1 g at 05/16/20 0520  . metoprolol tartrate (LOPRESSOR) tablet 25 mg  25 mg Oral BID  Elgergawy, Silver Huguenin, MD   25 mg at 05/16/20 0100  . montelukast (SINGULAIR) tablet 10 mg  10 mg Oral QHS Elgergawy, Silver Huguenin, MD   10 mg at 05/16/20 0101  . pantoprazole (PROTONIX) EC tablet 40 mg  40 mg Oral BID Elgergawy, Silver Huguenin, MD   40 mg at 05/16/20 0100  . pravastatin (PRAVACHOL) tablet 40 mg  40 mg Oral QPM Elgergawy, Silver Huguenin, MD      . vancomycin (VANCOREADY) IVPB 1250 mg/250 mL  1,250 mg Intravenous Q12H Edwina Barth Nita F, [facility]        REVIEW OF SYSTEMS:  [X]  denotes positive finding, [ ]  denotes negative finding Cardiac  Comments:  Chest pain or chest pressure:     Shortness of breath upon exertion:    Short of breath when lying flat:    Irregular heart rhythm:        Vascular    Pain in calf, thigh, or hip brought on by ambulation:    Pain in feet at night that wakes you up from your sleep:     Blood clot in your veins:    Leg swelling:         Pulmonary    Oxygen at home:    Productive cough:     Wheezing:         Neurologic    Sudden weakness in arms or legs:     Sudden numbness in arms or legs:     Sudden onset of difficulty speaking or slurred speech:    Temporary loss of vision in one eye:     Problems with dizziness:         Gastrointestinal    Blood in stool:     Vomited blood:         Genitourinary    Burning when urinating:     Blood in urine:        Psychiatric    Major depression:         Hematologic    Bleeding problems:    Problems with blood clotting too easily:        Skin    Rashes or ulcers: x       Constitutional    Fever or chills:     PHYSICAL  EXAM:   Vitals:   05/15/20 2300 05/16/20 0019 05/16/20 0407 05/16/20 0659  BP: 134/62 (!) 132/52 125/60 (!) 142/58  Pulse: 89 87 73 79  Resp:  20 18 18   Temp:  98.6 F (37 C) 98.3 F (36.8 C) 98.7 F (37.1 C)  TempSrc:  Oral Oral Oral  SpO2: 98% 99% 100% 97%  Weight:  79.9 kg    Height:  6\' 2"  (1.88 m)      GENERAL: The patient is a well-nourished male, in no acute distress. The vital signs are documented above. CARDIAC: There is a regular rate and rhythm.  VASCULAR: He has bilateral carotid bruits. He has a Doppler anterior tibial and posterior tibial on the left. On the right side he has a Doppler peroneal and posterior tibial artery. PULMONARY: There is good air exchange bilaterally without wheezing or rales. ABDOMEN: Soft and non-tender with normal pitched bowel sounds.  MUSCULOSKELETAL: There are no major deformities or cyanosis. NEUROLOGIC: No focal weakness or paresthesias are detected. SKIN: He has extensive gangrene of the entire right forefoot and also the lateral ankle and heel.  There are multiple pictures in the chart demonstrating this. PSYCHIATRIC: The patient has a normal  affect.  DATA:    I reviewed his CT angiogram which shows a 4.7 cm infrarenal abdominal aortic aneurysm.  He has significant diffuse calcific disease in the aorta and iliac arteries.  He has evidence of multilevel arterial occlusive disease involving the aortoiliac system and also the superficial femoral artery bilaterally.  LABS: GFR is greater than 60.

## 2020-05-16 NOTE — Progress Notes (Signed)
PROGRESS NOTE    Omar Todd  PJK:[id number] DOB: [birth year] DOA: 05/15/2020 PCP: [name], DO  Outpatient Specialists:     Brief Narrative:  As per H&P done by Dr. Alphonsa Overall "Omar Todd  is a 69 y.o. male, with medical history significant ofCOPD, hypertension, hyperlipidemia, DM type 2, COPD, anemiasince hospitalization in April of this year secondary to acute GI bleed, secondary to chronic gastritis, from NSAIDs use, unsuccessful colonoscopy as unable to tolerate GoLYTELY, patient then left AMA prior evaluation by vascular surgery(vascular surgery awaiting GI clearance prior to intervention), and apparently patient with worsening gangrene, recently seen by GI Dr. Buford Dresser in [place], unsuccessful colonoscopy, due to poor bowel prep, patient was brought to ED by his sister due to worsening right lower foot ischemia and gangrene, this has been progressive issue for few month, he had poor follow-up with podiatry as an outpatient despite multiple attempts to contact him, patient denies fever, chills, chest pain, shortness of breath, melena or hematochezia. - in ED patient to have mild leukocytosis 5, ED physician discussed with vascular Dr. Scot Dock reported patient need to be transferred to [facility], for further evaluation by vascular, and likely he will eventually need BKA or AKA, hemoglobin at baseline 8.3, v afebrile, triage hospitalist consulted to admit"  05/16/2020: Patient seen alongside patient's 2 sisters.  Input from vascular surgery orthopedic team appreciated.  For transtibial amputation of the right lower extremity tomorrow.  Patient has extensive dry gangrene involving the right foot.   Assessment & Plan:   Active Problems:   Diabetic neuropathy, type II diabetes mellitus ([place])   Hyperlipidemia associated with type 2 diabetes mellitus ([place])   Hypertension associated with diabetes ([place])   Diabetes mellitus type 2 controlled   BPH (benign prostatic  hyperplasia)   AAA (abdominal aortic aneurysm) without rupture ([facility])   Critical lower limb ischemia   Anemia   Gangrene of right foot ([facility])   PAD (peripheral artery disease) ([facility])   Severe protein-calorie malnutrition ([facility])  Critical limb ischemia/right foot gangrene: -Patient with dry/wet gangrene of right foot, with diminished pulses, will start empirically on IV vancomycin and meropenem for now, will obtain blood cultures. -Patient will need evaluation by vascular surgery given peripheral arterial disease bilaterally, and certainly will need revascularization addressed prior to amputation, patient will be transferred to [facility] for vascular surgery evaluation. -Patient left AMA during his recent hospitalization in April of this year before work-up was done. 05/16/2020: For right transtibial amputation tomorrow.  Microcytic anemia: -Patient with questionable history of GI bleed, endoscopy in April this year significant for chronic gastritis, colonoscopy 6/16 was aborted given poor bowel prep. -Patient will need colonoscopy done prior to intervention his lower extremity and revascularization to rule out GI bleed. -We will keep on clear liquid diet, will have morning team consult GI to perform colonoscopy prior to revascularization/amputation. 05/16/2020: Transfuse patient with packed red blood cells as needed.  Patient would like to follow with GI on outpatient basis to complete colonoscopy prep and screening.  AAA -Seen in most recent CT angio on 03/12/2020, this is to be evaluated by vascular surgery at [facility]. 05/16/2020: Vascular surgery team is following patient.  Diabetes mellitus -Hold oral regimen, will start on insulin sliding scale during hospital stay. 05/16/2020: Continue to monitor closely.  Hypertension -Continue with home medications 05/16/2020: Continue to optimize.  Goal blood pressure should be less than 130/80 mmHg.  COPD and ongoing tobacco  abuse -No wheezing, no hypoxia  Mild to moderate aortic stenosis -echo from 03/13/2020 noted,EF is preserved at 26 to 55%  DVT prophylaxis: Subcutaneous heparin Code Status: Full code Family Communication: 2 sisters  disposition Plan: This will depend on hospital course.   Consultants:  Vascular surgery Orthopedic surgeon  Procedures:   For right transtibial amputation tomorrow.  Antimicrobials:   IV vancomycin  IV meropenem   Subjective: No new complaints  Objective: Vitals:   05/16/20 0407 05/16/20 0659 05/16/20 1008 05/16/20 1315  BP: 125/60 (!) 142/58 (!) 136/47 130/66  Pulse: 73 79 75 67  Resp: 18 18 18 16   Temp: 98.3 F (36.8 C) 98.7 F (37.1 C) 98.2 F (36.8 C) 97.6 F (36.4 C)  TempSrc: Oral Oral Oral Oral  SpO2: 100% 97% 100% 100%  Weight:      Height:        Intake/Output Summary (Last 24 hours) at 05/16/2020 1345 Last data filed at 05/16/2020 1245 Gross per 24 hour  Intake 378.85 ml  Output 1000 ml  Net -621.15 ml   Filed Weights   05/15/20 1705 05/16/20 0019  Weight: 90 kg 79.9 kg    Examination:  General exam: Appears calm and comfortable  Respiratory system: Clear to auscultation.  Cardiovascular system: S1 & S2, systolic murmur  Gatrointestinal system: Abdomen is nondistended, soft and nontender. No organomegaly or masses felt. Normal bowel sounds heard. Central nervous system: Alert and oriented. No focal neurological deficits. Extremities: Dry gangrene right foot.  Allergies:        Data Reviewed: I have personally reviewed following labs and imaging studies  CBC: Recent Labs  Lab 05/15/20 1920 05/16/20 0110  WBC 12.5* 12.0*  NEUTROABS 10.3*  --   HGB 8.3* 7.7*  HCT 28.8* 27.1*  MCV 78.5* 77.7*  PLT 596* 481*   Basic Metabolic Panel: Recent Labs  Lab 05/15/20 1920 05/16/20 0110  NA 136 136  K 3.4* 3.4*  CL 99 98  CO2 28 27  GLUCOSE 141* 181*  BUN 14 8  CREATININE 0.71 0.76  CALCIUM 8.6* 8.5*    GFR: Estimated Creatinine Clearance: 98.5 mL/min (by C-G formula based on SCr of 0.76 mg/dL). Liver Function Tests: Recent Labs  Lab 05/15/20 1920  AST 20  ALT 22  ALKPHOS 134*  BILITOT 0.3  PROT 6.6  ALBUMIN 2.2*   No results for input(s): LIPASE, AMYLASE in the last 168 hours. No results for input(s): AMMONIA in the last 168 hours. Coagulation Profile: Recent Labs  Lab 05/15/20 1920  INR 1.1   Cardiac Enzymes: No results for input(s): CKTOTAL, CKMB, CKMBINDEX, TROPONINI in the last 168 hours. BNP (last 3 results) No results for input(s): PROBNP in the last 8760 hours. HbA1C: Recent Labs    05/15/20 1920  HGBA1C 8.3*   CBG: Recent Labs  Lab 05/15/20 2246 05/16/20 0750 05/16/20 1116  GLUCAP 165* 175* 152*   Lipid Profile: No results for input(s): CHOL, HDL, LDLCALC, TRIG, CHOLHDL, LDLDIRECT in the last 72 hours. Thyroid Function Tests: No results for input(s): TSH, T4TOTAL, FREET4, T3FREE, THYROIDAB in the last 72 hours. Anemia Panel: No results for input(s): VITAMINB12, FOLATE, FERRITIN, TIBC, IRON, RETICCTPCT in the last 72 hours. Urine analysis:    Component Value Date/Time   APPEARANCEUR Clear 08/08/2018 1119   GLUCOSEU Negative 08/08/2018 1119   BILIRUBINUR Negative 08/08/2018 1119   PROTEINUR Negative 08/08/2018 1119   UROBILINOGEN negative 10/31/2014 1002   NITRITE Negative 08/08/2018 1119   LEUKOCYTESUR Negative 08/08/2018 1119   Sepsis Labs: @LABRCNTIP (procalcitonin:4,lacticidven:4)  )  Recent Results (from the past 240 hour(s))  SARS Coronavirus 2 by RT PCR (hospital order, performed in [facility] hospital lab) Nasopharyngeal Nasopharyngeal Swab     Status: None   Collection Time: 05/15/20  7:20 PM   Specimen: Nasopharyngeal Swab  Result Value Ref Range Status   SARS Coronavirus 2 NEGATIVE NEGATIVE Final    Comment: (NOTE) SARS-CoV-2 target nucleic acids are NOT DETECTED.  The SARS-CoV-2 RNA is generally detectable in upper and  lower respiratory specimens during the acute phase of infection. The lowest concentration of SARS-CoV-2 viral copies this assay can detect is 250 copies / mL. A negative result does not preclude SARS-CoV-2 infection and should not be used as the sole basis for treatment or other patient management decisions.  A negative result may occur with improper specimen collection / handling, submission of specimen other than nasopharyngeal swab, presence of viral mutation(s) within the areas targeted by this assay, and inadequate number of viral copies (<250 copies / mL). A negative result must be combined with clinical observations, patient history, and epidemiological information.  Fact Sheet for Patients:   StrictlyIdeas.no  Fact Sheet for Healthcare Providers: BankingDealers.co.za  This test is not yet approved or  cleared by the [place] FDA and has been authorized for detection and/or diagnosis of SARS-CoV-2 by FDA under an Emergency Use Authorization (EUA).  This EUA will remain in effect (meaning this test can be used) for the duration of the COVID-19 declaration under Section 564(b)(1) of the Act, 21 U.S.C. section 360bbb-3(b)(1), unless the authorization is terminated or revoked sooner.  Performed at [facility], [address] [place], [place] [address]   Culture, blood (routine x 2)     Status: None (Preliminary result)   Collection Time: 05/15/20  9:33 PM   Specimen: BLOOD RIGHT FOREARM  Result Value Ref Range Status   Specimen Description BLOOD RIGHT FOREARM  Final   Special Requests   Final    BOTTLES DRAWN AEROBIC AND ANAEROBIC Blood Culture results may not be optimal due to an inadequate volume of blood received in culture bottles   Culture   Final    NO GROWTH < 12 HOURS Performed at [facility], [address], [place], [place] [address]    Report Status PENDING  Incomplete  Culture, blood (routine x 2)     Status:  None (Preliminary result)   Collection Time: 05/15/20  9:37 PM   Specimen: BLOOD LEFT HAND  Result Value Ref Range Status   Specimen Description BLOOD LEFT HAND  Final   Special Requests   Final    BOTTLES DRAWN AEROBIC AND ANAEROBIC Blood Culture results may not be optimal due to an inadequate volume of blood received in culture bottles   Culture   Final    NO GROWTH < 12 HOURS Performed at [facility], [address], [place], [place] [address]    Report Status PENDING  Incomplete         Radiology Studies: DG Chest 2 View  Result Date: 05/15/2020 CLINICAL DATA:  RIGHT foot wound, injury in January, gangrenous foot, history diabetes mellitus, hypertension, COPD EXAM: CHEST - 2 VIEW COMPARISON:  09/21/2017 FINDINGS: Normal heart size, mediastinal contours, and pulmonary vascularity. Atherosclerotic calcification aorta. Lungs clear. No pulmonary infiltrate, pleural effusion, or pneumothorax. Osseous structures unremarkable. IMPRESSION: No acute abnormalities. Electronically Signed   By: Lavonia Dana M.D.   On: 05/15/2020 20:06   DG Tibia/Fibula Right  Result Date: 05/15/2020 CLINICAL DATA:  Gangrenous foot  EXAM: RIGHT TIBIA AND FIBULA - 2 VIEW COMPARISON:  None. FINDINGS: No acute displaced fracture or malalignment. No periostitis or frank bone destruction. Diffuse soft tissue swelling. IMPRESSION: No acute osseous abnormality. Diffuse soft tissue swelling. Electronically Signed   By: Donavan Foil M.D.   On: 05/15/2020 20:06   DG Foot Complete Right  Result Date: 05/15/2020 CLINICAL DATA:  69 year old male with right foot necrosis. EXAM: RIGHT FOOT COMPLETE - 3+ VIEW COMPARISON:  Radiograph dated 02/13/2020. FINDINGS: There is no acute fracture or dislocation. The bones are osteopenic. No periosteal elevation or bone erosion. There is diffuse soft tissue air predominantly involving the forefoot consistent with necrotizing fasciitis. Clinical correlation is recommended. A skin wound noted  along the dorsal aspect of the calcaneus. IMPRESSION: 1. No acute fracture or dislocation. 2. Diffuse soft tissue air predominantly involving the forefoot consistent with gangrenous changes. Electronically Signed   By: Anner Crete M.D.   On: 05/15/2020 18:10        Scheduled Meds: . amLODipine  10 mg Oral Daily  . escitalopram  10 mg Oral Daily  . ferrous sulfate  325 mg Oral Q breakfast  . heparin  5,000 Units Subcutaneous Q8H  . hydrochlorothiazide  25 mg Oral Daily  . insulin aspart  0-15 Units Subcutaneous TID WC  . insulin aspart  0-5 Units Subcutaneous QHS  . lisinopril  40 mg Oral Daily  . mouth rinse  15 mL Mouth Rinse BID  . metoprolol tartrate  25 mg Oral BID  . montelukast  10 mg Oral QHS  . pantoprazole  40 mg Oral BID  . pravastatin  40 mg Oral QPM   Continuous Infusions: . meropenem (MERREM) IV 1 g (05/16/20 0520)  . vancomycin 1,250 mg (05/16/20 1106)     LOS: 1 day    Time spent: 35 minutes    Dana Allan, MD  [facility] Pager #: [phone] 7PM-7AM contact night coverage as above

## 2020-05-16 NOTE — Consult Note (Signed)
ORTHOPAEDIC CONSULTATION  REQUESTING PHYSICIAN: Bonnell Public, MD  Chief Complaint: Gangrene right foot.  HPI: Omar Todd is a 68 y.o. male who presents with chronic history of gangrene right foot.  Patient has a history of diabetes peripheral vascular disease and protein caloric malnutrition.  Past Medical History:  Diagnosis Date  . COPD (chronic obstructive pulmonary disease) ([place])   . Diabetes mellitus without complication ([place])   . Gangrene of toe of right foot ([place])   . GERD (gastroesophageal reflux disease)   . Hyperlipidemia   . Hypertension    Past Surgical History:  Procedure Laterality Date  . BIOPSY  03/13/2020   Procedure: BIOPSY;  Surgeon: Daneil Dolin, MD;  [facility];  Service: Endoscopy;;  gastric biopsy  . ESOPHAGOGASTRODUODENOSCOPY N/A 03/13/2020   Procedure: ESOPHAGOGASTRODUODENOSCOPY (EGD);  Surgeon: Daneil Dolin, MD;  [facility];  Service: Endoscopy;  Laterality: N/A;  . FLEXIBLE SIGMOIDOSCOPY N/A 05/06/2020   Procedure: FLEXIBLE SIGMOIDOSCOPY;  Surgeon: Daneil Dolin, MD;  [facility];  Service: Endoscopy;  Laterality: N/A;  . Thumb surgery     Social History   Socioeconomic History  . Marital status: Married    Spouse name: [name]  . Number of children: 3  . Years of education: 9th Grade  . Highest education level: 9th grade  Occupational History  . Occupation: Retired    Comment: Science writer  Tobacco Use  . Smoking status: Current Every Day Smoker    Packs/day: 0.50    Years: 60.00    Pack years: 30.00    Types: Cigarettes  . Smokeless tobacco: Never Used  Vaping Use  . Vaping Use: Former  Substance and Sexual Activity  . Alcohol use: No    Alcohol/week: 0.0 standard drinks  . Drug use: No  . Sexual activity: Yes    Birth control/protection: None  Other Topics Concern  . Not on file  Social History Narrative  . Not on file   Social Determinants of Health    Financial Resource Strain: Low Risk   . Difficulty of Paying Living Expenses: Not very hard  Food Insecurity: No Food Insecurity  . Worried About Charity fundraiser in the Last Year: Never true  . Ran Out of Food in the Last Year: Never true  Transportation Needs: No Transportation Needs  . Lack of Transportation (Medical): No  . Lack of Transportation (Non-Medical): No  Physical Activity: Inactive  . Days of Exercise per Week: 0 days  . Minutes of Exercise per Session: 0 min  Stress: No Stress Concern Present  . Feeling of Stress : Only a little  Social Connections: Socially Integrated  . Frequency of Communication with Friends and Family: More than three times a week  . Frequency of Social Gatherings with Friends and Family: More than three times a week  . Attends Religious Services: More than 4 times per year  . Active Member of Clubs or Organizations: Yes  . Attends Archivist Meetings: More than 4 times per year  . Marital Status: Married   Family History  Problem Relation Age of Onset  . Cancer Father   . Diabetes Sister   . Stroke Brother   . Healthy Sister   . Healthy Sister   . Colon cancer Neg Hx   . Gastric cancer Neg Hx    - negative except otherwise stated in the family history section Allergies  Allergen Reactions  . Penicillins  Other (See Comments)    Pt. States he "passed out"   Prior to Admission medications   Medication Sig Start Date End Date Taking? Authorizing Provider  amLODipine (NORVASC) 10 MG tablet Take 1 tablet (10 mg total) by mouth daily. 03/14/20  Yes Roxan Hockey, MD  aspirin EC 81 MG tablet Take 1 tablet (81 mg total) by mouth daily with breakfast. 03/14/20  Yes Emokpae, Courage, MD  BIOTIN 5000 PO Take 5,000 mcg by mouth daily.    Yes [provider]  cholecalciferol (VITAMIN D) 1000 UNITS tablet Take 1,000 Units by mouth daily.   Yes [provider]  empagliflozin (JARDIANCE) 25 MG TABS tablet Take 25 mg  by mouth daily before breakfast. 03/14/20  Yes Emokpae, Courage, MD  escitalopram (LEXAPRO) 10 MG tablet Take 1 tablet (10 mg total) by mouth daily. 03/14/20  Yes Roxan Hockey, MD  ferrous sulfate 325 (65 FE) MG tablet Take 1 tablet (325 mg total) by mouth daily with breakfast. 03/14/20  Yes Emokpae, Courage, MD  hydrochlorothiazide (HYDRODIURIL) 25 MG tablet Take 1 tablet (25 mg total) by mouth daily. 03/14/20  Yes Emokpae, Courage, MD  lisinopril (ZESTRIL) 40 MG tablet TAKE ONE (1) TABLET EACH DAY Patient taking differently: Take 40 mg by mouth daily.  03/14/20  Yes Roxan Hockey, MD  metFORMIN (GLUCOPHAGE) 1000 MG tablet Take 1 tablet (1,000 mg total) by mouth 2 (two) times daily with a meal. 03/14/20  Yes Emokpae, Courage, MD  metoprolol tartrate (LOPRESSOR) 25 MG tablet Take 1 tablet (25 mg total) by mouth 2 (two) times daily. 03/14/20  Yes Emokpae, Courage, MD  montelukast (SINGULAIR) 10 MG tablet TAKE ONE TABLET DAILY AT BEDTIME Patient taking differently: Take 10 mg by mouth at bedtime.  01/07/20  Yes Gottschalk, Ashly M, DO  pantoprazole (PROTONIX) 40 MG tablet Take 1 tablet (40 mg total) by mouth daily. 03/15/20  Yes Roxan Hockey, MD  pravastatin (PRAVACHOL) 40 MG tablet Take 1 tablet (40 mg total) by mouth every evening. 03/14/20  Yes Emokpae, Courage, MD  [name] (JANUVIA) 100 MG tablet Take 1 tablet (100 mg total) by mouth daily. 03/14/20  Yes Emokpae, Courage, MD  cephALEXin (KEFLEX) 500 MG capsule Take 1 capsule (500 mg total) by mouth 3 (three) times daily. Patient not taking: Reported on 05/15/2020 04/17/20   Sharion Balloon, FNP  collagenase (SANTYL) ointment Apply 1 application topically daily. Wound Measurements:  Right heel - 1.5 x 0.1cm Patient not taking: Reported on 05/15/2020 03/27/20   Evelina Bucy, DPM  mupirocin cream (BACTROBAN) 2 % Apply 1 application topically 2 (two) times daily. Patient not taking: Reported on 05/15/2020 02/05/20   Desma Mcgregor   DG  Chest 2 View  Result Date: 05/15/2020 CLINICAL DATA:  RIGHT foot wound, injury in January, gangrenous foot, history diabetes mellitus, hypertension, COPD EXAM: CHEST - 2 VIEW COMPARISON:  09/21/2017 FINDINGS: Normal heart size, mediastinal contours, and pulmonary vascularity. Atherosclerotic calcification aorta. Lungs clear. No pulmonary infiltrate, pleural effusion, or pneumothorax. Osseous structures unremarkable. IMPRESSION: No acute abnormalities. Electronically Signed   By: Lavonia Dana M.D.   On: 05/15/2020 20:06   DG Tibia/Fibula Right  Result Date: 05/15/2020 CLINICAL DATA:  Gangrenous foot EXAM: RIGHT TIBIA AND FIBULA - 2 VIEW COMPARISON:  None. FINDINGS: No acute displaced fracture or malalignment. No periostitis or frank bone destruction. Diffuse soft tissue swelling. IMPRESSION: No acute osseous abnormality. Diffuse soft tissue swelling. Electronically Signed   By: Donavan Foil M.D.   On:  05/15/2020 20:06   DG Foot Complete Right  Result Date: 05/15/2020 CLINICAL DATA:  69 year old male with right foot necrosis. EXAM: RIGHT FOOT COMPLETE - 3+ VIEW COMPARISON:  Radiograph dated 02/13/2020. FINDINGS: There is no acute fracture or dislocation. The bones are osteopenic. No periosteal elevation or bone erosion. There is diffuse soft tissue air predominantly involving the forefoot consistent with necrotizing fasciitis. Clinical correlation is recommended. A skin wound noted along the dorsal aspect of the calcaneus. IMPRESSION: 1. No acute fracture or dislocation. 2. Diffuse soft tissue air predominantly involving the forefoot consistent with gangrenous changes. Electronically Signed   By: Anner Crete M.D.   On: 05/15/2020 18:10   - pertinent xrays, CT, MRI studies were reviewed and independently interpreted  Positive ROS: All other systems have been reviewed and were otherwise negative with the exception of those mentioned in the HPI and as above.  Physical Exam: General: Alert, no acute  distress Psychiatric: Patient is competent for consent with normal mood and affect Lymphatic: No axillary or cervical lymphadenopathy Cardiovascular: No pedal edema Respiratory: No cyanosis, no use of accessory musculature GI: No organomegaly, abdomen is soft and non-tender    Images:  @ENCIMAGES @  Labs:  Lab Results  Component Value Date   HGBA1C 7.9 (H) 03/13/2020   HGBA1C 8.7 (H) 12/09/2019   HGBA1C 7.9 (H) 08/21/2019   ESRSEDRATE 54 (H) 03/13/2020   CRP 5.8 (H) 03/13/2020   REPTSTATUS PENDING 05/15/2020   CULT  05/15/2020    NO GROWTH < 12 HOURS Performed at [facility], [address], [place], [place] [address]     Lab Results  Component Value Date   ALBUMIN 2.2 (L) 05/15/2020   ALBUMIN 3.8 02/05/2020   ALBUMIN 4.4 08/08/2018    Neurologic: Patient does not have protective sensation bilateral lower extremities.   MUSCULOSKELETAL:   Skin: Examination patient has dry gangrene involving the entire foot with gangrene extending to the hindfoot.  Patient does not have a palpable pulse.  Albumin 2.2.,  Hemoglobin A1c 7.9.  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  Plan: We will plan for a right transtibial amputation tomorrow Sunday morning risks and benefits were discussed including risk of the wound not healing.  Patient states he understands wishes to proceed at this time.  Thank you for the consult and the opportunity to see Omar Todd, [place] [phone] 9:18 AM

## 2020-05-17 ENCOUNTER — Inpatient Hospital Stay (HOSPITAL_COMMUNITY): Payer: Medicare Other | Admitting: Certified Registered Nurse Anesthetist

## 2020-05-17 ENCOUNTER — Encounter (HOSPITAL_COMMUNITY): Payer: Self-pay | Admitting: Internal Medicine

## 2020-05-17 ENCOUNTER — Encounter (HOSPITAL_COMMUNITY)
Admission: AD | Disposition: A | Discharge status: Skilled Nursing Facility | Payer: Self-pay | Source: Home / Self Care | Attending: Internal Medicine

## 2020-05-17 DIAGNOSIS — I739 Peripheral vascular disease, unspecified: Secondary | ICD-10-CM

## 2020-05-17 HISTORY — PX: APPLICATION OF WOUND VAC: SHX5189

## 2020-05-17 HISTORY — PX: AMPUTATION: SHX166

## 2020-05-17 LAB — RENAL FUNCTION PANEL
Albumin: 1.8 g/dL — ABNORMAL LOW (ref 3.5–5.0)
Anion gap: 9 (ref 5–15)
BUN: 10 mg/dL (ref 8–23)
CO2: 26 mmol/L (ref 22–32)
Calcium: 8.3 mg/dL — ABNORMAL LOW (ref 8.9–10.3)
Chloride: 97 mmol/L — ABNORMAL LOW (ref 98–111)
Creatinine, Ser: 0.74 mg/dL (ref 0.61–1.24)
GFR calc Af Amer: >60 mL/min (ref >60)
GFR calc non Af Amer: >60 mL/min (ref >60)
Glucose, Bld: 152 mg/dL — ABNORMAL HIGH (ref 70–99)
Phosphorus: 2.9 mg/dL (ref 2.5–4.6)
Potassium: 3.8 mmol/L (ref 3.5–5.1)
Sodium: 132 mmol/L — ABNORMAL LOW (ref 135–145)

## 2020-05-17 LAB — CBC WITH DIFFERENTIAL/PLATELET
Abs Immature Granulocytes: 0.06 10*3/uL (ref 0.00–0.07)
Basophils Absolute: 0 10*3/uL (ref 0.0–0.1)
Basophils Relative: 0 %
Eosinophils Absolute: 0.1 10*3/uL (ref 0.0–0.5)
Eosinophils Relative: 1 %
HCT: 27.6 % — ABNORMAL LOW (ref 39.0–52.0)
Hemoglobin: 7.9 g/dL — ABNORMAL LOW (ref 13.0–17.0)
Immature Granulocytes: 1 %
Lymphocytes Relative: 10 %
Lymphs Abs: 1.1 10*3/uL (ref 0.7–4.0)
MCH: 22.1 pg — ABNORMAL LOW (ref 26.0–34.0)
MCHC: 28.6 g/dL — ABNORMAL LOW (ref 30.0–36.0)
MCV: 77.1 fL — ABNORMAL LOW (ref 80.0–100.0)
Monocytes Absolute: 0.6 10*3/uL (ref 0.1–1.0)
Monocytes Relative: 5 %
Neutro Abs: 9.2 10*3/uL — ABNORMAL HIGH (ref 1.7–7.7)
Neutrophils Relative %: 83 %
Platelets: 548 10*3/uL — ABNORMAL HIGH (ref 150–400)
RBC: 3.58 MIL/uL — ABNORMAL LOW (ref 4.22–5.81)
RDW: 19.8 % — ABNORMAL HIGH (ref 11.5–15.5)
WBC: 11.1 10*3/uL — ABNORMAL HIGH (ref 4.0–10.5)
nRBC: 0 % (ref 0.0–0.2)

## 2020-05-17 LAB — GLUCOSE, CAPILLARY
Glucose-Capillary: 155 mg/dL — ABNORMAL HIGH (ref 70–99)
Glucose-Capillary: 144 mg/dL — ABNORMAL HIGH (ref 70–99)
Glucose-Capillary: 174 mg/dL — ABNORMAL HIGH (ref 70–99)
Glucose-Capillary: 207 mg/dL — ABNORMAL HIGH (ref 70–99)
Glucose-Capillary: 202 mg/dL — ABNORMAL HIGH (ref 70–99)
Glucose-Capillary: 175 mg/dL — ABNORMAL HIGH (ref 70–99)

## 2020-05-17 LAB — MAGNESIUM: Magnesium: 1.6 mg/dL — ABNORMAL LOW (ref 1.7–2.4)

## 2020-05-17 SURGERY — AMPUTATION BELOW KNEE
Anesthesia: General | Site: Leg Lower | Laterality: Right

## 2020-05-17 MED ORDER — LACTATED RINGERS IV SOLN: INTRAVENOUS | Status: DC

## 2020-05-17 MED ORDER — DOCUSATE SODIUM 100 MG PO CAPS
100.0000 mg | ORAL_CAPSULE | Freq: Two times a day (BID) | ORAL | Status: DC
  Administered 2020-05-17 – 2020-05-19 (×5): 100 mg via ORAL
  Filled 2020-05-17 (×5): qty 1

## 2020-05-17 MED ORDER — HYDROMORPHONE HCL 1 MG/ML IJ SOLN
0.5000 mg | INTRAMUSCULAR | Status: DC | PRN
  Administered 2020-05-17 – 2020-05-18 (×2): 1 mg via INTRAVENOUS
  Filled 2020-05-17 (×2): qty 1

## 2020-05-17 MED ORDER — ONDANSETRON HCL 4 MG/2ML IJ SOLN
INTRAMUSCULAR | Status: AC
  Filled 2020-05-17: qty 2

## 2020-05-17 MED ORDER — METOCLOPRAMIDE HCL 5 MG PO TABS: 5.0000 mg | ORAL_TABLET | Freq: Three times a day (TID) | ORAL | Status: DC | PRN

## 2020-05-17 MED ORDER — CHLORHEXIDINE GLUCONATE 0.12 % MT SOLN
OROMUCOSAL | Status: AC
  Administered 2020-05-17: 15 mL
  Filled 2020-05-17: qty 15

## 2020-05-17 MED ORDER — OXYCODONE HCL 5 MG PO TABS
5.0000 mg | ORAL_TABLET | ORAL | Status: DC | PRN
  Filled 2020-05-17: qty 2

## 2020-05-17 MED ORDER — FENTANYL CITRATE (PF) 100 MCG/2ML IJ SOLN
INTRAMUSCULAR | Status: DC | PRN
  Administered 2020-05-17 (×2): 25 ug via INTRAVENOUS
  Administered 2020-05-17: 50 ug via INTRAVENOUS
  Administered 2020-05-17: 25 ug via INTRAVENOUS

## 2020-05-17 MED ORDER — PHENYLEPHRINE 40 MCG/ML (10ML) SYRINGE FOR IV PUSH (FOR BLOOD PRESSURE SUPPORT)
PREFILLED_SYRINGE | INTRAVENOUS | Status: AC
  Filled 2020-05-17: qty 10

## 2020-05-17 MED ORDER — SODIUM CHLORIDE 0.9 % IV SOLN: INTRAVENOUS | Status: DC

## 2020-05-17 MED ORDER — PROPOFOL 10 MG/ML IV BOLUS
INTRAVENOUS | Status: AC
  Filled 2020-05-17: qty 20

## 2020-05-17 MED ORDER — FENTANYL CITRATE (PF) 100 MCG/2ML IJ SOLN: 25.0000 ug | INTRAMUSCULAR | Status: DC | PRN

## 2020-05-17 MED ORDER — METHOCARBAMOL 500 MG PO TABS: 500.0000 mg | ORAL_TABLET | Freq: Four times a day (QID) | ORAL | Status: DC | PRN

## 2020-05-17 MED ORDER — CHLORHEXIDINE GLUCONATE 4 % EX LIQD: 60.0000 mL | Freq: Once | CUTANEOUS | Status: DC

## 2020-05-17 MED ORDER — FENTANYL CITRATE (PF) 100 MCG/2ML IJ SOLN
INTRAMUSCULAR | Status: AC
  Filled 2020-05-17: qty 2

## 2020-05-17 MED ORDER — DEXAMETHASONE SODIUM PHOSPHATE 4 MG/ML IJ SOLN
INTRAMUSCULAR | Status: DC | PRN
  Administered 2020-05-17: 4 mg via INTRAVENOUS

## 2020-05-17 MED ORDER — KETOROLAC TROMETHAMINE 30 MG/ML IJ SOLN: 30.0000 mg | Freq: Once | INTRAMUSCULAR | Status: DC | PRN

## 2020-05-17 MED ORDER — FENTANYL CITRATE (PF) 250 MCG/5ML IJ SOLN
INTRAMUSCULAR | Status: AC
  Filled 2020-05-17: qty 5

## 2020-05-17 MED ORDER — POVIDONE-IODINE 10 % EX SWAB: 2.0000 "application " | Freq: Once | CUTANEOUS | Status: DC

## 2020-05-17 MED ORDER — MIDAZOLAM HCL 2 MG/2ML IJ SOLN
INTRAMUSCULAR | Status: AC
  Filled 2020-05-17: qty 2

## 2020-05-17 MED ORDER — CLINDAMYCIN PHOSPHATE 900 MG/50ML IV SOLN: 900.0000 mg | INTRAVENOUS | Status: DC

## 2020-05-17 MED ORDER — BISACODYL 10 MG RE SUPP: 10.0000 mg | Freq: Every day | RECTAL | Status: DC | PRN

## 2020-05-17 MED ORDER — OXYCODONE HCL 5 MG PO TABS
10.0000 mg | ORAL_TABLET | ORAL | Status: DC | PRN
  Administered 2020-05-17: 15 mg via ORAL
  Administered 2020-05-18: 10 mg via ORAL
  Administered 2020-05-18: 15 mg via ORAL
  Administered 2020-05-19 – 2020-05-20 (×2): 10 mg via ORAL
  Filled 2020-05-17: qty 2
  Filled 2020-05-17: qty 3
  Filled 2020-05-17: qty 2
  Filled 2020-05-17: qty 3

## 2020-05-17 MED ORDER — ACETAMINOPHEN 325 MG PO TABS: 325.0000 mg | ORAL_TABLET | Freq: Four times a day (QID) | ORAL | Status: DC | PRN

## 2020-05-17 MED ORDER — METOCLOPRAMIDE HCL 5 MG/ML IJ SOLN: 5.0000 mg | Freq: Three times a day (TID) | INTRAMUSCULAR | Status: DC | PRN

## 2020-05-17 MED ORDER — MIDAZOLAM HCL 2 MG/2ML IJ SOLN
INTRAMUSCULAR | Status: DC | PRN
  Administered 2020-05-17: 2 mg via INTRAVENOUS

## 2020-05-17 MED ORDER — PROPOFOL 10 MG/ML IV BOLUS
INTRAVENOUS | Status: DC | PRN
  Administered 2020-05-17: 200 mg via INTRAVENOUS

## 2020-05-17 MED ORDER — POLYETHYLENE GLYCOL 3350 17 G PO PACK: 17.0000 g | PACK | Freq: Every day | ORAL | Status: DC | PRN

## 2020-05-17 MED ORDER — METHOCARBAMOL 1000 MG/10ML IJ SOLN
500.0000 mg | Freq: Four times a day (QID) | INTRAVENOUS | Status: DC | PRN
  Filled 2020-05-17: qty 5

## 2020-05-17 MED ORDER — PHENYLEPHRINE 40 MCG/ML (10ML) SYRINGE FOR IV PUSH (FOR BLOOD PRESSURE SUPPORT)
PREFILLED_SYRINGE | INTRAVENOUS | Status: DC | PRN
  Administered 2020-05-17 (×2): 80 ug via INTRAVENOUS

## 2020-05-17 MED ORDER — ONDANSETRON HCL 4 MG/2ML IJ SOLN: 4.0000 mg | Freq: Four times a day (QID) | INTRAMUSCULAR | Status: DC | PRN

## 2020-05-17 MED ORDER — DEXAMETHASONE SODIUM PHOSPHATE 10 MG/ML IJ SOLN
INTRAMUSCULAR | Status: AC
  Filled 2020-05-17: qty 1

## 2020-05-17 MED ORDER — PHENYLEPHRINE HCL-NACL 10-0.9 MG/250ML-% IV SOLN
INTRAVENOUS | Status: DC | PRN
  Administered 2020-05-17: 25 ug/min via INTRAVENOUS

## 2020-05-17 MED ORDER — MAGNESIUM CITRATE PO SOLN: 1.0000 | Freq: Once | ORAL | Status: DC | PRN

## 2020-05-17 MED ORDER — LIDOCAINE 2% (20 MG/ML) 5 ML SYRINGE
INTRAMUSCULAR | Status: DC | PRN
  Administered 2020-05-17: 60 mg via INTRAVENOUS

## 2020-05-17 MED ORDER — ONDANSETRON HCL 4 MG/2ML IJ SOLN
INTRAMUSCULAR | Status: DC | PRN
  Administered 2020-05-17: 4 mg via INTRAVENOUS

## 2020-05-17 MED ORDER — LIDOCAINE 2% (20 MG/ML) 5 ML SYRINGE
INTRAMUSCULAR | Status: AC
  Filled 2020-05-17: qty 5

## 2020-05-17 MED ORDER — ONDANSETRON HCL 4 MG PO TABS: 4.0000 mg | ORAL_TABLET | Freq: Four times a day (QID) | ORAL | Status: DC | PRN

## 2020-05-17 MED ORDER — 0.9 % SODIUM CHLORIDE (POUR BTL) OPTIME
TOPICAL | Status: DC | PRN
Start: 2020-05-17 — End: 2020-05-17
  Administered 2020-05-17: 1000 mL

## 2020-05-17 SURGICAL SUPPLY — 38 items
BLADE SAW RECIP 87.9 MT (BLADE) ×3 IMPLANT
BLADE SURG 21 STRL SS (BLADE) ×3 IMPLANT
BNDG COHESIVE 6X5 TAN STRL LF (GAUZE/BANDAGES/DRESSINGS) ×2 IMPLANT
CANISTER WOUND CARE 500ML ATS (WOUND CARE) ×3 IMPLANT
COVER SURGICAL LIGHT HANDLE (MISCELLANEOUS) ×3 IMPLANT
CUFF TOURN SGL QUICK 34 (TOURNIQUET CUFF) ×3
CUFF TRNQT CYL 34X4.125X (TOURNIQUET CUFF) ×1 IMPLANT
DRAPE INCISE IOBAN 66X45 STRL (DRAPES) ×3 IMPLANT
DRAPE U-SHAPE 47X51 STRL (DRAPES) ×3 IMPLANT
DRESSING PREVENA PLUS CUSTOM (GAUZE/BANDAGES/DRESSINGS) ×1 IMPLANT
DRSG PREVENA PLUS CUSTOM (GAUZE/BANDAGES/DRESSINGS) ×3
DURAPREP 26ML APPLICATOR (WOUND CARE) ×3 IMPLANT
ELECT REM PT RETURN 9FT ADLT (ELECTROSURGICAL) ×3
ELECTRODE REM PT RTRN 9FT ADLT (ELECTROSURGICAL) ×1 IMPLANT
GLOVE BIOGEL PI IND STRL 9 (GLOVE) ×1 IMPLANT
GLOVE BIOGEL PI INDICATOR 9 (GLOVE) ×2
GLOVE SURG ORTHO 9.0 STRL STRW (GLOVE) ×3 IMPLANT
GOWN STRL REUS W/ TWL XL LVL3 (GOWN DISPOSABLE) ×2 IMPLANT
GOWN STRL REUS W/TWL XL LVL3 (GOWN DISPOSABLE) ×6
KIT BASIN OR (CUSTOM PROCEDURE TRAY) ×3 IMPLANT
KIT TURNOVER KIT B (KITS) ×3 IMPLANT
MANIFOLD NEPTUNE II (INSTRUMENTS) ×3 IMPLANT
NS IRRIG 1000ML POUR BTL (IV SOLUTION) ×3 IMPLANT
PACK ORTHO EXTREMITY (CUSTOM PROCEDURE TRAY) ×3 IMPLANT
PAD ARMBOARD 7.5X6 YLW CONV (MISCELLANEOUS) ×3 IMPLANT
PREVENA RESTOR ARTHOFORM 46X30 (CANNISTER) ×3 IMPLANT
SAW OSC TIP CART 19.5X105X1.3 (SAW) ×2 IMPLANT
SPONGE LAP 18X18 RF (DISPOSABLE) IMPLANT
STAPLER VISISTAT 35W (STAPLE) IMPLANT
STOCKINETTE IMPERVIOUS LG (DRAPES) ×3 IMPLANT
SUT ETHILON 2 0 PSLX (SUTURE) ×4 IMPLANT
SUT SILK 2 0 (SUTURE) ×3
SUT SILK 2-0 18XBRD TIE 12 (SUTURE) ×1 IMPLANT
SUT VIC AB 1 CTX 27 (SUTURE) ×6 IMPLANT
TOWEL GREEN STERILE (TOWEL DISPOSABLE) ×3 IMPLANT
TUBE CONNECTING 12'X1/4 (SUCTIONS) ×1
TUBE CONNECTING 12X1/4 (SUCTIONS) ×2 IMPLANT
YANKAUER SUCT BULB TIP NO VENT (SUCTIONS) ×3 IMPLANT

## 2020-05-17 NOTE — Transfer of Care (Signed)
Immediate Anesthesia Transfer of Care Note  Patient: Omar Todd  Procedure(s) Performed: AMPUTATION BELOW RIGHT KNEE (Right Leg Lower) APPLICATION OF WOUND VAC (Right Leg Lower)  Patient Location: PACU  Anesthesia Type:General  Level of Consciousness: drowsy  Airway & Oxygen Therapy: Patient Spontanous Breathing and Patient connected to face mask oxygen  Post-op Assessment: Report given to RN and Post -op Vital signs reviewed and stable  Post vital signs: Reviewed and stable  Last Vitals:  Vitals Value Taken Time  BP 139/57 05/17/20 0945  Temp    Pulse 66 05/17/20 0945  Resp 18 05/17/20 0945  SpO2 100 % 05/17/20 0945  Vitals shown include unvalidated device data.  Last Pain:  Vitals:   05/17/20 0520  TempSrc: Oral  PainSc:       Patients Stated Pain Goal: 0 (29/02/11 1552)  Complications: No complications documented.

## 2020-05-17 NOTE — Interval H&P Note (Signed)
History and Physical Interval Note:  05/17/2020 7:44 AM  Omar Todd  has presented today for surgery, with the diagnosis of GANGRENE RIGHT FOOT.  The various methods of treatment have been discussed with the patient and family. After consideration of risks, benefits and other options for treatment, the patient has consented to  Procedure(s): AMPUTATION BELOW KNEE (Right) as a surgical intervention.  The patient's history has been reviewed, patient examined, no change in status, stable for surgery.  I have reviewed the patient's chart and labs.  Questions were answered to the patient's satisfaction.     Newt Minion

## 2020-05-17 NOTE — Progress Notes (Signed)
PROGRESS NOTE    Omar Todd  XBW:[id number] DOB: 1950-12-22 DOA: 05/15/2020 PCP: [name], DO  Outpatient Specialists:     Brief Narrative:  As per H&P done by Dr. Alphonsa Overall "Omar Todd  is a 69 y.o. male, with medical history significant ofCOPD, hypertension, hyperlipidemia, DM type 2, COPD, anemiasince hospitalization in April of this year secondary to acute GI bleed, secondary to chronic gastritis, from NSAIDs use, unsuccessful colonoscopy as unable to tolerate GoLYTELY, patient then left AMA prior evaluation by vascular surgery(vascular surgery awaiting GI clearance prior to intervention), and apparently patient with worsening gangrene, recently seen by GI Dr. Buford Dresser in [place], unsuccessful colonoscopy, due to poor bowel prep, patient was brought to ED by his sister due to worsening right lower foot ischemia and gangrene, this has been progressive issue for few month, he had poor follow-up with podiatry as an outpatient despite multiple attempts to contact him, patient denies fever, chills, chest pain, shortness of breath, melena or hematochezia. - in ED patient to have mild leukocytosis 5, ED physician discussed with vascular Dr. Scot Dock reported patient need to be transferred to [facility], for further evaluation by vascular, and likely he will eventually need BKA or AKA, hemoglobin at baseline 8.3, v afebrile, triage hospitalist consulted to admit"  05/16/2020: Patient seen alongside patient's 2 sisters.  Input from vascular surgery orthopedic team appreciated.  For transtibial amputation of the right lower extremity tomorrow.  Patient has extensive dry gangrene involving the right foot.  05/17/2020: Patient seen.  No new complaints.  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  Input from the orthopedic team is highly appreciated.   Assessment & Plan:   Active Problems:   Diabetic neuropathy, type II diabetes mellitus ([place])   Hyperlipidemia associated with  type 2 diabetes mellitus ([place])   Hypertension associated with diabetes ([place])   Diabetes mellitus type 2 controlled   BPH (benign prostatic hyperplasia)   AAA (abdominal aortic aneurysm) without rupture ([facility])   Critical lower limb ischemia   Anemia   Gangrene of right foot ([facility])   PAD (peripheral artery disease) ([facility])   Severe protein-calorie malnutrition ([facility])   Pressure injury of skin  Critical limb ischemia/right foot gangrene: -Patient with dry/wet gangrene of right foot, with diminished pulses, will start empirically on IV vancomycin and meropenem for now, will obtain blood cultures. -Patient will need evaluation by vascular surgery given peripheral arterial disease bilaterally, and certainly will need revascularization addressed prior to amputation, patient will be transferred to [facility] for vascular surgery evaluation. -Patient left AMA during his recent hospitalization in April of this year before work-up was done. 05/16/2020: For right transtibial amputation tomorrow. 05/17/2020: Patient underwent right BKA.  Microcytic anemia: -Patient with questionable history of GI bleed, endoscopy in April this year significant for chronic gastritis, colonoscopy 6/16 was aborted given poor bowel prep. -Patient will need colonoscopy done prior to intervention his lower extremity and revascularization to rule out GI bleed. -We will keep on clear liquid diet, will have morning team consult GI to perform colonoscopy prior to revascularization/amputation. 05/16/2020: Transfuse patient with packed red blood cells as needed.  Patient would like to follow with GI on outpatient basis to complete colonoscopy prep and screening.  AAA -Seen in most recent CT angio on 03/12/2020, this is to be evaluated by vascular surgery at [facility]. 05/17/2020: Vascular surgery team is following patient.  Diabetes mellitus -Hold oral regimen, will start on insulin sliding scale during hospital  stay. 05/17/2020:  Continue to monitor closely.  Hypertension -Continue with home medications 05/17/2020: Continue to optimize.  Goal blood pressure should be less than 130/80 mmHg.  COPD and ongoing tobacco abuse -No wheezing, no hypoxia  Mild to moderate aortic stenosis -echo from 03/13/2020 noted,EF is preserved at 50 to 55%  DVT prophylaxis: Subcutaneous heparin Code Status: Full code Family Communication: 2 sisters  disposition Plan: This will depend on hospital course.   Consultants:  Vascular surgery Orthopedic surgeon  Procedures:   For right transtibial amputation tomorrow.  Antimicrobials:   IV vancomycin  IV meropenem   Subjective: No new complaints  Objective: Vitals:   05/17/20 0945 05/17/20 1000 05/17/20 1025 05/17/20 1739  BP: (!) 139/57 (!) 131/54 (!) 137/53 129/61  Pulse: 67 71 81 74  Resp: 18 19 18 18   Temp: 98 F (36.7 C)  98.2 F (36.8 C) 98.4 F (36.9 C)  TempSrc:   Oral Oral  SpO2: 100% 98% 100% 94%  Weight:      Height:        Intake/Output Summary (Last 24 hours) at 05/17/2020 1905 Last data filed at 05/17/2020 1834 Gross per 24 hour  Intake 1632.05 ml  Output 1825 ml  Net -192.95 ml   Filed Weights   05/15/20 1705 05/16/20 0019  Weight: 90 kg 79.9 kg    Examination:  General exam: Appears calm and comfortable.  Patient underwent right BKA today. Respiratory system: Clear to auscultation.  Cardiovascular system: S1 & S2, systolic murmur  Gatrointestinal system: Abdomen is nondistended, soft and nontender. No organomegaly or masses felt. Normal bowel sounds heard. Central nervous system: Alert and oriented. No focal neurological deficits. Extremities: Dry gangrene right foot.  Allergies:        Data Reviewed: I have personally reviewed following labs and imaging studies  CBC: Recent Labs  Lab 05/15/20 1920 05/16/20 0110 05/17/20 0344  WBC 12.5* 12.0* 11.1*  NEUTROABS 10.3*  --  9.2*  HGB 8.3* 7.7* 7.9*   HCT 28.8* 27.1* 27.6*  MCV 78.5* 77.7* 77.1*  PLT 596* 544* 841*   Basic Metabolic Panel: Recent Labs  Lab 05/15/20 1920 05/16/20 0110 05/17/20 0344  NA 136 136 132*  K 3.4* 3.4* 3.8  CL 99 98 97*  CO2 28 27 26   GLUCOSE 141* 181* 152*  BUN 14 8 10   CREATININE 0.71 0.76 0.74  CALCIUM 8.6* 8.5* 8.3*  MG  --   --  1.6*  PHOS  --   --  2.9   GFR: Estimated Creatinine Clearance: 98.5 mL/min (by C-G formula based on SCr of 0.74 mg/dL). Liver Function Tests: Recent Labs  Lab 05/15/20 1920 05/17/20 0344  AST 20  --   ALT 22  --   ALKPHOS 134*  --   BILITOT 0.3  --   PROT 6.6  --   ALBUMIN 2.2* 1.8*   No results for input(s): LIPASE, AMYLASE in the last 168 hours. No results for input(s): AMMONIA in the last 168 hours. Coagulation Profile: Recent Labs  Lab 05/15/20 1920  INR 1.1   Cardiac Enzymes: No results for input(s): CKTOTAL, CKMB, CKMBINDEX, TROPONINI in the last 168 hours. BNP (last 3 results) No results for input(s): PROBNP in the last 8760 hours. HbA1C: Recent Labs    05/15/20 1920  HGBA1C 8.3*   CBG: Recent Labs  Lab 05/17/20 0800 05/17/20 0947 05/17/20 1007 05/17/20 1256 05/17/20 1705  GLUCAP 155* 144* 174* 207* 202*   Lipid Profile: No results for input(s): CHOL, HDL, LDLCALC,  TRIG, CHOLHDL, LDLDIRECT in the last 72 hours. Thyroid Function Tests: No results for input(s): TSH, T4TOTAL, FREET4, T3FREE, THYROIDAB in the last 72 hours. Anemia Panel: No results for input(s): VITAMINB12, FOLATE, FERRITIN, TIBC, IRON, RETICCTPCT in the last 72 hours. Urine analysis:    Component Value Date/Time   APPEARANCEUR Clear 08/08/2018 1119   GLUCOSEU Negative 08/08/2018 1119   BILIRUBINUR Negative 08/08/2018 1119   PROTEINUR Negative 08/08/2018 1119   UROBILINOGEN negative 10/31/2014 1002   NITRITE Negative 08/08/2018 1119   LEUKOCYTESUR Negative 08/08/2018 1119   Sepsis Labs: @LABRCNTIP (VOZDGUYQIHKVQ:2,VZDGLOVFIEP:3)  ) Recent Results (from  the past 240 hour(s))  SARS Coronavirus 2 by RT PCR (hospital order, performed in [place] hospital lab) Nasopharyngeal Nasopharyngeal Swab     Status: None   Collection Time: 05/15/20  7:20 PM   Specimen: Nasopharyngeal Swab  Result Value Ref Range Status   SARS Coronavirus 2 NEGATIVE NEGATIVE Final    Comment: (NOTE) SARS-CoV-2 target nucleic acids are NOT DETECTED.  The SARS-CoV-2 RNA is generally detectable in upper and lower respiratory specimens during the acute phase of infection. The lowest concentration of SARS-CoV-2 viral copies this assay can detect is 250 copies / mL. A negative result does not preclude SARS-CoV-2 infection and should not be used as the sole basis for treatment or other patient management decisions.  A negative result may occur with improper specimen collection / handling, submission of specimen other than nasopharyngeal swab, presence of viral mutation(s) within the areas targeted by this assay, and inadequate number of viral copies (<250 copies / mL). A negative result must be combined with clinical observations, patient history, and epidemiological information.  Fact Sheet for Patients:   StrictlyIdeas.no  Fact Sheet for Healthcare Providers: BankingDealers.co.za  This test is not yet approved or  cleared by the [place] FDA and has been authorized for detection and/or diagnosis of SARS-CoV-2 by FDA under an Emergency Use Authorization (EUA).  This EUA will remain in effect (meaning this test can be used) for the duration of the COVID-19 declaration under Section 564(b)(1) of the Act, 21 U.S.C. section 360bbb-3(b)(1), unless the authorization is terminated or revoked sooner.  Performed at [facility], [address], [place], [place] [address]   Culture, blood (routine x 2)     Status: None (Preliminary result)   Collection Time: 05/15/20  9:33 PM   Specimen: BLOOD RIGHT FOREARM  Result Value  Ref Range Status   Specimen Description BLOOD RIGHT FOREARM  Final   Special Requests   Final    BOTTLES DRAWN AEROBIC AND ANAEROBIC Blood Culture results may not be optimal due to an inadequate volume of blood received in culture bottles   Culture   Final    NO GROWTH < 12 HOURS Performed at [facility], [address], [place], [place] [address]    Report Status PENDING  Incomplete  Culture, blood (routine x 2)     Status: None (Preliminary result)   Collection Time: 05/15/20  9:37 PM   Specimen: BLOOD LEFT HAND  Result Value Ref Range Status   Specimen Description BLOOD LEFT HAND  Final   Special Requests   Final    BOTTLES DRAWN AEROBIC AND ANAEROBIC Blood Culture results may not be optimal due to an inadequate volume of blood received in culture bottles   Culture   Final    NO GROWTH < 12 HOURS Performed at [facility], [address] [place] [address], [place],  [address]    Report Status PENDING  Incomplete  MRSA PCR Screening     Status: None   Collection Time: 05/16/20  5:03 PM   Specimen: Nasal Mucosa; Nasopharyngeal  Result Value Ref Range Status   MRSA by PCR NEGATIVE NEGATIVE Final    Comment:        The GeneXpert MRSA Assay (FDA approved for NASAL specimens only), is one component of a comprehensive MRSA colonization surveillance program. It is not intended to diagnose MRSA infection nor to guide or monitor treatment for MRSA infections. Performed at [facility], [place] [address] [place], [place] [address]          Radiology Studies: DG Chest 2 View  Result Date: 05/15/2020 CLINICAL DATA:  RIGHT foot wound, injury in January, gangrenous foot, history diabetes mellitus, hypertension, COPD EXAM: CHEST - 2 VIEW COMPARISON:  09/21/2017 FINDINGS: Normal heart size, mediastinal contours, and pulmonary vascularity. Atherosclerotic calcification aorta. Lungs clear. No pulmonary infiltrate, pleural effusion, or pneumothorax. Osseous structures unremarkable. IMPRESSION:  No acute abnormalities. Electronically Signed   By: Lavonia Dana M.D.   On: 05/15/2020 20:06   DG Tibia/Fibula Right  Result Date: 05/15/2020 CLINICAL DATA:  Gangrenous foot EXAM: RIGHT TIBIA AND FIBULA - 2 VIEW COMPARISON:  None. FINDINGS: No acute displaced fracture or malalignment. No periostitis or frank bone destruction. Diffuse soft tissue swelling. IMPRESSION: No acute osseous abnormality. Diffuse soft tissue swelling. Electronically Signed   By: Donavan Foil M.D.   On: 05/15/2020 20:06        Scheduled Meds: . amLODipine  10 mg Oral Daily  . docusate sodium  100 mg Oral BID  . escitalopram  10 mg Oral Daily  . ferrous sulfate  325 mg Oral Q breakfast  . heparin  5,000 Units Subcutaneous Q8H  . hydrochlorothiazide  25 mg Oral Daily  . insulin aspart  0-15 Units Subcutaneous TID WC  . insulin aspart  0-5 Units Subcutaneous QHS  . lisinopril  40 mg Oral Daily  . mouth rinse  15 mL Mouth Rinse BID  . metoprolol tartrate  25 mg Oral BID  . montelukast  10 mg Oral QHS  . pantoprazole  40 mg Oral BID  . pravastatin  40 mg Oral QPM   Continuous Infusions: . sodium chloride 10 mL/hr at 05/17/20 1411  . lactated ringers 10 mL/hr at 05/17/20 0845  . meropenem (MERREM) IV 1 g (05/17/20 1413)  . methocarbamol (ROBAXIN) IV    . vancomycin 1,250 mg (05/17/20 1048)     LOS: 2 days    Time spent: 25 minutes    Dana Allan, MD  [facility] Pager #: [phone] 7PM-7AM contact night coverage as above

## 2020-05-17 NOTE — Progress Notes (Signed)
Patient arrived to room 6N15 from PACU via bed. Patient is alert and oriented times 4. VSS. NAD. Belongings and call bell within reach. Bed is in the lowest and locked position with bed rails up times 2. Wound vac in place and prevena sent with patient. Patient updated as to plan of care and all questions addressed at this time.

## 2020-05-17 NOTE — Anesthesia Postprocedure Evaluation (Signed)
Anesthesia Post Note  Patient: Omar Todd  Procedure(s) Performed: AMPUTATION BELOW RIGHT KNEE (Right Leg Lower) APPLICATION OF WOUND VAC (Right Leg Lower)     Patient location during evaluation: PACU Anesthesia Type: General Level of consciousness: awake and alert Pain management: pain level controlled Vital Signs Assessment: post-procedure vital signs reviewed and stable Respiratory status: spontaneous breathing, nonlabored ventilation, respiratory function stable and patient connected to nasal cannula oxygen Cardiovascular status: blood pressure returned to baseline and stable Postop Assessment: no apparent nausea or vomiting Anesthetic complications: no   No complications documented.  Last Vitals:  Vitals:   05/17/20 1025 05/17/20 1739  BP: (!) 137/53 129/61  Pulse: 81 74  Resp: 18 18  Temp: 36.8 C 36.9 C  SpO2: 100% 94%    Last Pain:  Vitals:   05/17/20 1810  TempSrc:   PainSc: [name]

## 2020-05-17 NOTE — Op Note (Signed)
° °  Date of Surgery: 05/17/2020  INDICATIONS: Mr. 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  PREOPERATIVE DIAGNOSIS: gangrene right foot  POSTOPERATIVE DIAGNOSIS: Same.  PROCEDURE: Transtibial amputation Application of Prevena wound VAC  SURGEON: Sharol Given, M.D.  ANESTHESIA:  general  IV FLUIDS AND URINE: See anesthesia.  ESTIMATED BLOOD LOSS: min mL.  COMPLICATIONS: None.  DESCRIPTION OF PROCEDURE: The patient was brought to the operating room and underwent a general anesthetic. After adequate levels of anesthesia were obtained patient's lower extremity was prepped using DuraPrep draped into a sterile field. A timeout was called. The foot was draped out of the sterile field with impervious stockinette. A transverse incision was made 11 cm distal to the tibial tubercle. This curved proximally and a large posterior flap was created. The tibia was transected 1 cm proximal to the skin incision. The fibula was transected just proximal to the tibial incision. The tibia was beveled anteriorly. A large posterior flap was created. The sciatic nerve was pulled cut and allowed to retract. The vascular bundles were suture ligated with 2-0 silk. The deep and superficial fascial layers were closed using #1 Vicryl. The skin was closed using staples and 2-0 nylon. The wound was covered with a Prevena wound VAC. There was a good suction fit. A prosthetic shrinker was applied. Patient was extubated taken to the PACU in stable condition.   DISCHARGE PLANNING:  Antibiotic duration:continue antibiotics for 24 hours  Weightbearing: NWB right  Pain medication: opoid pathway  Dressing care/ Wound AYO:[id number] vac for 1 week  Discharge to: SNF  Follow-up: In the office 1 week post operative.  Meridee Score, MD [place] 9:39 AM

## 2020-05-17 NOTE — Anesthesia Preprocedure Evaluation (Signed)
Anesthesia Evaluation  Patient identified by MRN, date of birth, ID band Patient awake    Reviewed: Allergy & Precautions, NPO status , Patient's Chart, lab work & pertinent test results  Airway Mallampati: II  TM Distance: >3 FB Neck ROM: Full    Dental   Pulmonary COPD, Current Smoker,    breath sounds clear to auscultation       Cardiovascular hypertension, Pt. on medications and Pt. on home beta blockers + Peripheral Vascular Disease   Rhythm:Regular Rate:Normal     Neuro/Psych negative neurological ROS     GI/Hepatic Neg liver ROS, GERD  ,  Endo/Other  diabetes, Poorly Controlled, Type 2, Oral Hypoglycemic Agents  Renal/GU negative Renal ROS     Musculoskeletal   Abdominal   Peds  Hematology  (+) anemia ,   Anesthesia Other Findings   Reproductive/Obstetrics                             Anesthesia Physical Anesthesia Plan  ASA: III  Anesthesia Plan: General   Post-op Pain Management:    Induction: Intravenous  PONV Risk Score and Plan: 1 and Dexamethasone, Ondansetron and Treatment may vary due to age or medical condition  Airway Management Planned: LMA  Additional Equipment:   Intra-op Plan:   Post-operative Plan: Extubation in OR  Informed Consent: I have reviewed the patients History and Physical, chart, labs and discussed the procedure including the risks, benefits and alternatives for the proposed anesthesia with the patient or authorized representative who has indicated his/her understanding and acceptance.     Dental advisory given  Plan Discussed with: CRNA  Anesthesia Plan Comments:         Anesthesia Quick Evaluation

## 2020-05-17 NOTE — Anesthesia Procedure Notes (Signed)
Procedure Name: LMA Insertion Date/Time: 05/17/2020 8:55 AM Performed by: Candis Shine, CRNA Pre-anesthesia Checklist: Patient identified, Emergency Drugs available, Suction available and Patient being monitored Patient Re-evaluated:Patient Re-evaluated prior to induction Oxygen Delivery Method: Circle System Utilized Preoxygenation: Pre-oxygenation with 100% oxygen Induction Type: IV induction LMA: LMA inserted LMA Size: 5.0 Number of attempts: 1 Placement Confirmation: positive ETCO2 Tube secured with: Tape Dental Injury: Teeth and Oropharynx as per pre-operative assessment

## 2020-05-17 NOTE — OR Nursing (Signed)
Prevena Restor box sent with patient to PACU with patient label attached.

## 2020-05-18 ENCOUNTER — Encounter (HOSPITAL_COMMUNITY): Payer: Self-pay | Admitting: Orthopedic Surgery

## 2020-05-18 ENCOUNTER — Encounter: Payer: Self-pay | Admitting: Family Medicine

## 2020-05-18 ENCOUNTER — Inpatient Hospital Stay (HOSPITAL_COMMUNITY): Payer: Medicare Other

## 2020-05-18 DIAGNOSIS — R0989 Other specified symptoms and signs involving the circulatory and respiratory systems: Secondary | ICD-10-CM

## 2020-05-18 LAB — CBC WITH DIFFERENTIAL/PLATELET
Abs Immature Granulocytes: 0.07 10*3/uL (ref 0.00–0.07)
Basophils Absolute: 0 10*3/uL (ref 0.0–0.1)
Basophils Relative: 0 %
Eosinophils Absolute: 0 10*3/uL (ref 0.0–0.5)
Eosinophils Relative: 0 %
HCT: 27.7 % — ABNORMAL LOW (ref 39.0–52.0)
Hemoglobin: 7.8 g/dL — ABNORMAL LOW (ref 13.0–17.0)
Immature Granulocytes: 1 %
Lymphocytes Relative: 6 %
Lymphs Abs: 0.7 10*3/uL (ref 0.7–4.0)
MCH: 21.7 pg — ABNORMAL LOW (ref 26.0–34.0)
MCHC: 28.2 g/dL — ABNORMAL LOW (ref 30.0–36.0)
MCV: 77.2 fL — ABNORMAL LOW (ref 80.0–100.0)
Monocytes Absolute: 0.6 10*3/uL (ref 0.1–1.0)
Monocytes Relative: 5 %
Neutro Abs: 11.2 10*3/uL — ABNORMAL HIGH (ref 1.7–7.7)
Neutrophils Relative %: 88 %
Platelets: 556 10*3/uL — ABNORMAL HIGH (ref 150–400)
RBC: 3.59 MIL/uL — ABNORMAL LOW (ref 4.22–5.81)
RDW: 19.6 % — ABNORMAL HIGH (ref 11.5–15.5)
WBC: 12.6 10*3/uL — ABNORMAL HIGH (ref 4.0–10.5)
nRBC: 0 % (ref 0.0–0.2)

## 2020-05-18 LAB — GLUCOSE, CAPILLARY
Glucose-Capillary: 200 mg/dL — ABNORMAL HIGH (ref 70–99)
Glucose-Capillary: 185 mg/dL — ABNORMAL HIGH (ref 70–99)
Glucose-Capillary: 244 mg/dL — ABNORMAL HIGH (ref 70–99)
Glucose-Capillary: 152 mg/dL — ABNORMAL HIGH (ref 70–99)

## 2020-05-18 NOTE — TOC Initial Note (Addendum)
Transition of Care [facility]) - Initial/Assessment Note    Patient Details  Name: Omar Todd MRN: [id number] Date of Birth: 07/05/1951  Transition of Care [facility] CM/SW Contact:    Alexander Mt, LCSW Phone Number: 05/18/2020, 11:23 AM  Clinical Narrative:                11:41am- CSW received a call from [facility], she is interested after speaking with pt in referrals being broadly faxed out further in [employer]. These referrals have been made.    11:23am- CSW spoke with pt at bedside. Introduced self, role, reason for visit. Pt from home alone, he has two sisters that assist him as needed. He describes issues with this foot and is at peace with the amputation as he knew he needed something done. Pt agreeable to rehab, he hopes to get stronger. His wife is currently at [facility] but he states he doesn't have a preference and defers further conversation to his sisters. He recommends I call [name] as Inez Catalina is at work.  CSW called and left HIPAA compliant message with [name] at [phone]. [name] returned call, CSW introduced self, role, reason for call. [name] in agreement with rehab, she is interested in pt going to [facility] as proximity wise it is near family and pt wife is there. Pt sister has my number for any additional questions. She confirmed pt has not had his COVID vaccines at this time and understands there likely will be a quarantine period.   CSW called and initiated insurance auth with [facility] Medicare ref [phone]. CSW also spoke with Melissa in admissions at [facility] for them to review referral.   Expected Discharge Plan: [place] Barriers to Discharge: Ship broker, Continued Medical Work up   Patient Goals and CMS Choice Patient states their goals for this hospitalization and ongoing recovery are:: learn how to get around without my foot CMS Medicare.gov Compare Post Acute Care list provided to:: Patient Represenative (must  comment) (pt sister [name]) Choice offered to / list presented to : Sibling, Patient  Expected Discharge Plan and Services Expected Discharge Plan: [place] In-house Referral: Clinical Social Work Discharge Planning Services: CM Consult Post Acute Care Choice: [place] Living arrangements for the past 2 months: Apartment  Prior Living Arrangements/Services Living arrangements for the past 2 months: Apartment Lives with:: Self Patient language and need for interpreter reviewed:: Yes (no needs) Do you feel safe going back to the place where you live?: Yes      Need for Family Participation in Patient Care: Yes (Comment) (assistance with daily cares) Care giver support system in place?: Yes (comment) (sisters) Current home services: DME Criminal Activity/Legal Involvement Pertinent to Current Situation/Hospitalization: No - Comment as needed  Activities of Daily Living Home Assistive Devices/Equipment: Walker (specify type), Blood pressure cuff, CBG Meter, Shower chair with back, Scales ADL Screening (condition at time of admission) Patient's cognitive ability adequate to safely complete daily activities?: Yes Is the patient deaf or have difficulty hearing?: No Does the patient have difficulty seeing, even when wearing glasses/contacts?: No Does the patient have difficulty concentrating, remembering, or making decisions?: No Patient able to express need for assistance with ADLs?: No Does the patient have difficulty dressing or bathing?: No Independently performs ADLs?: No Communication: Independent Dressing (OT): Independent Grooming: Independent Feeding: Independent Bathing: Independent Toileting: Independent In/Out Bed: Independent Walks in Home: Independent with device (comment) Does the patient have difficulty walking or climbing stairs?: Yes Weakness  of Legs: Both Weakness of Arms/Hands: Left  Permission Sought/Granted Permission sought to  share information with : Family Supports Permission granted to share information with : Yes, Verbal Permission Granted  Share Information with NAME: [name]  Permission granted to share info w AGENCY: [place] granted to share info w Relationship: sister  Permission granted to share info w Contact Information: [phone]  Emotional Assessment Appearance:: Appears stated age Attitude/Demeanor/Rapport: Engaged, Gracious Affect (typically observed): Accepting, Adaptable, Appropriate, Pleasant Orientation: : Oriented to Self, Oriented to Place, Oriented to  Time, Oriented to Situation Alcohol / Substance Use: Tobacco Use Psych Involvement: No (comment)  Admission diagnosis:  Critical lower limb ischemia [I99.8] Gangrene of right foot [facility]) [I96] Patient Active Problem List   Diagnosis Date Noted  . Pressure injury of skin 05/16/2020  . Severe protein-calorie malnutrition ([place])   . Gangrene of right foot ([place]) 03/26/2020  . PAD (peripheral artery disease) ([place]) 03/26/2020  . Anemia 03/12/2020  . GI bleed 03/12/2020  . AAA (abdominal aortic aneurysm) without rupture ([place]) 02/28/2020  . Critical lower limb ischemia 02/28/2020  . BPH (benign prostatic hyperplasia) 10/31/2014  . Systolic ejection murmur 59/93/5701  . Bilateral carotid bruits 10/31/2014  . Erectile dysfunction 11/05/2013  . Bilateral carotid artery disease ([place]) 04/11/2013  . Hyperlipidemia associated with type 2 diabetes mellitus ([employer]) 04/11/2013  . Hypertension associated with diabetes ([place]) 04/11/2013  . COPD exacerbation ([place]) 04/11/2013  . Diabetes mellitus type 2 controlled 04/11/2013  . Diabetic neuropathy, type II diabetes mellitus ([place]) 03/15/2013   PCP:  [name], DO Pharmacy:   [place], [place] [address] Phone: [phone] Fax: [place], [place] [address] Phone: [phone] Fax: [phone]  [facility] [place], [place] [address] Phone: [phone] Fax: [phone]   Readmission Risk Interventions Readmission Risk Prevention Plan 05/18/2020  Transportation Screening Complete  PCP or Specialist Appt within 3-5 Days Not Complete  Not Complete comments plan for SNF  HRI or Home Care Consult Complete  Social Work Consult for [place] Planning/Counseling Complete  Palliative Care Screening Not Complete  Palliative Care Screening Not Complete Comments could be appropriate  Medication Review (RN Care Manager) Referral to Pharmacy  Some recent data might be hidden

## 2020-05-18 NOTE — Progress Notes (Signed)
PROGRESS NOTE    Omar Todd  LOV:[id number] DOB: [birth year] DOA: 05/15/2020 PCP: Omar Norlander, DO  Outpatient Specialists:     Brief Narrative:  As per H&P done by Omar Todd "Omar Todd  is a 69 y.o. male, with medical history significant ofCOPD, hypertension, hyperlipidemia, DM type 2, COPD, anemiasince hospitalization in April of this year secondary to acute GI bleed, secondary to chronic gastritis, from NSAIDs use, unsuccessful colonoscopy as unable to tolerate GoLYTELY, patient then left AMA prior evaluation by vascular surgery(vascular surgery awaiting GI clearance prior to intervention), and apparently patient with worsening gangrene, recently seen by GI Omar Todd in [place], unsuccessful colonoscopy, due to poor bowel prep, patient was brought to ED by his sister due to worsening right lower foot ischemia and gangrene, this has been progressive issue for few month, he had poor follow-up with podiatry as an outpatient despite multiple attempts to contact him, patient denies fever, chills, chest pain, shortness of breath, melena or hematochezia. - in ED patient to have mild leukocytosis 5, ED physician discussed with vascular Omar Todd reported patient need to be transferred to [facility], for further evaluation by vascular, and likely he will eventually need BKA or AKA, hemoglobin at baseline 8.3, v afebrile, triage hospitalist consulted to admit"  05/16/2020: Patient seen alongside patient's 2 sisters.  Input from vascular surgery orthopedic team appreciated.  For transtibial amputation of the right lower extremity tomorrow.  Patient has extensive dry gangrene involving the right foot.  05/17/2020: Patient seen.  No new complaints.  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  Input from the orthopedic team is highly appreciated.  05/18/2020: Patient seen.  No new complaints.  Patient is awaiting disposition.  Will discontinue antibiotics after today.  New Covid  test ordered.   Assessment & Plan:   Active Problems:   Diabetic neuropathy, type II diabetes mellitus ([place])   Hyperlipidemia associated with type 2 diabetes mellitus ([place])   Hypertension associated with diabetes ([place])   Diabetes mellitus type 2 controlled   BPH (benign prostatic hyperplasia)   AAA (abdominal aortic aneurysm) without rupture (H[facility])   Critical lower limb ischemia   Anemia   Gangrene of right foot (HCC)   PAD (peripheral artery disease) (HCC)   Severe protein-calorie malnutrition (HCC)   Pressure injury of skin  Critical limb ischemia/right foot gangrene: -Patient with dry/wet gangrene of right foot, with diminished pulses, will start empirically on IV vancomycin and meropenem for now, will obtain blood cultures. -Patient will need evaluation by vascular surgery given peripheral arterial disease bilaterally, and certainly will need revascularization addressed prior to amputation, patient will be transferred to [facility] for vascular surgery evaluation. -Patient left AMA during his recent hospitalization in April of this year before work-up was done. 05/16/2020: For right transtibial amputation tomorrow. 05/18/2020: Patient underwent right BKA.  Pursue disposition when a bed is available.  COVID-19 test ordered.  No new complaints today.  Microcytic anemia: -Patient with questionable history of GI bleed, endoscopy in April this year significant for chronic gastritis, colonoscopy 6/16 was aborted given poor bowel prep. -Patient will need colonoscopy done prior to intervention his lower extremity and revascularization to rule out GI bleed. -We will keep on clear liquid diet, will have morning team consult GI to perform colonoscopy prior to revascularization/amputation. 05/16/2020: Transfuse patient with packed red blood cells as needed.  Patient would like to follow with GI on outpatient basis to complete colonoscopy prep and screening.  AAA -Seen in  most recent CT  angio on 03/12/2020, this is to be evaluated by vascular surgery at [facility]. 05/18/2020: Vascular surgery team is following patient.  Diabetes mellitus -Hold oral regimen, will start on insulin sliding scale during hospital stay. 05/18/2020: Continue to monitor closely.  Hypertension -Continue with home medications 05/17/2020: Continue to optimize.  Goal blood pressure should be less than 130/80 mmHg.  COPD and ongoing tobacco abuse -No wheezing, no hypoxia  Mild to moderate aortic stenosis -echo from 03/13/2020 noted,EF is preserved at 50 to 55%  DVT prophylaxis: Subcutaneous heparin Code Status: Full code Family Communication: 2 sisters  disposition Plan: This will depend on hospital course.   Consultants:  Vascular surgery Orthopedic surgeon  Procedures:   For right transtibial amputation tomorrow.  Antimicrobials:   IV vancomycin  IV meropenem   Subjective: No new complaints  Objective: Vitals:   05/17/20 2131 05/18/20 0159 05/18/20 0527 05/18/20 1335  BP: 130/65 127/62 137/69 (!) 125/59  Pulse: 95 65 73 69  Resp: 17 16 17 17   Temp: 97.8 F (36.6 C) (!) 97.4 F (36.3 C) 98.6 F (37 C) 98.2 F (36.8 C)  TempSrc: Oral Oral Oral Oral  SpO2: 95% 95% 94% 96%  Weight:      Height:        Intake/Output Summary (Last 24 hours) at 05/18/2020 1607 Last data filed at 05/18/2020 1310 Gross per 24 hour  Intake 881.03 ml  Output 1100 ml  Net -218.97 ml   Filed Weights   05/15/20 1705 05/16/20 0019  Weight: 90 kg 79.9 kg    Examination:  General exam: Appears calm and comfortable.  Patient underwent right BKA today. Respiratory system: Clear to auscultation.  Cardiovascular system: S1 & S2, systolic murmur  Gatrointestinal system: Abdomen is nondistended, soft and nontender. No organomegaly or masses felt. Normal bowel sounds heard. Central nervous system: Alert and oriented. No focal neurological deficits. Extremities: Status post right BKA.  Data  Reviewed: I have personally reviewed following labs and imaging studies  CBC: Recent Labs  Lab 05/15/20 1920 05/16/20 0110 05/17/20 0344 05/18/20 0241  WBC 12.5* 12.0* 11.1* 12.6*  NEUTROABS 10.3*  --  9.2* 11.2*  HGB 8.3* 7.7* 7.9* 7.8*  HCT 28.8* 27.1* 27.6* 27.7*  MCV 78.5* 77.7* 77.1* 77.2*  PLT 596* 544* 548* 160*   Basic Metabolic Panel: Recent Labs  Lab 05/15/20 1920 05/16/20 0110 05/17/20 0344  NA 136 136 132*  K 3.4* 3.4* 3.8  CL 99 98 97*  CO2 28 27 26   GLUCOSE 141* 181* 152*  BUN 14 8 10   CREATININE 0.71 0.76 0.74  CALCIUM 8.6* 8.5* 8.3*  MG  --   --  1.6*  PHOS  --   --  2.9   GFR: Estimated Creatinine Clearance: 98.5 mL/min (by C-G formula based on SCr of 0.74 mg/dL). Liver Function Tests: Recent Labs  Lab 05/15/20 1920 05/17/20 0344  AST 20  --   ALT 22  --   ALKPHOS 134*  --   BILITOT 0.3  --   PROT 6.6  --   ALBUMIN 2.2* 1.8*   No results for input(s): LIPASE, AMYLASE in the last 168 hours. No results for input(s): AMMONIA in the last 168 hours. Coagulation Profile: Recent Labs  Lab 05/15/20 1920  INR 1.1   Cardiac Enzymes: No results for input(s): CKTOTAL, CKMB, CKMBINDEX, TROPONINI in the last 168 hours. BNP (last 3 results) No results for input(s): PROBNP in the last 8760 hours. HbA1C: Recent Labs  05/15/20 1920  HGBA1C 8.3*   CBG: Recent Labs  Lab 05/17/20 1256 05/17/20 1705 05/17/20 2126 05/18/20 0749 05/18/20 1148  GLUCAP 207* 202* 175* 200* 185*   Lipid Profile: No results for input(s): CHOL, HDL, LDLCALC, TRIG, CHOLHDL, LDLDIRECT in the last 72 hours. Thyroid Function Tests: No results for input(s): TSH, T4TOTAL, FREET4, T3FREE, THYROIDAB in the last 72 hours. Anemia Panel: No results for input(s): VITAMINB12, FOLATE, FERRITIN, TIBC, IRON, RETICCTPCT in the last 72 hours. Urine analysis:    Component Value Date/Time   APPEARANCEUR Clear 08/08/2018 1119   GLUCOSEU Negative 08/08/2018 1119   BILIRUBINUR  Negative 08/08/2018 1119   PROTEINUR Negative 08/08/2018 1119   UROBILINOGEN negative 10/31/2014 1002   NITRITE Negative 08/08/2018 1119   LEUKOCYTESUR Negative 08/08/2018 1119   Sepsis Labs: @LABRCNTIP (procalcitonin:4,lacticidven:4)  ) Recent Results (from the past 240 hour(s))  SARS Coronavirus 2 by RT PCR (hospital order, performed in [place] hospital lab) Nasopharyngeal Nasopharyngeal Swab     Status: None   Collection Time: 05/15/20  7:20 PM   Specimen: Nasopharyngeal Swab  Result Value Ref Range Status   SARS Coronavirus 2 NEGATIVE NEGATIVE Final    Comment: (NOTE) SARS-CoV-2 target nucleic acids are NOT DETECTED.  The SARS-CoV-2 RNA is generally detectable in upper and lower respiratory specimens during the acute phase of infection. The lowest concentration of SARS-CoV-2 viral copies this assay can detect is 250 copies / mL. A negative result does not preclude SARS-CoV-2 infection and should not be used as the sole basis for treatment or other patient management decisions.  A negative result may occur with improper specimen collection / handling, submission of specimen other than nasopharyngeal swab, presence of viral mutation(s) within the areas targeted by this assay, and inadequate number of viral copies (<250 copies / mL). A negative result must be combined with clinical observations, patient history, and epidemiological information.  Fact Sheet for Patients:   StrictlyIdeas.no  Fact Sheet for Healthcare Providers: BankingDealers.co.za  This test is not yet approved or  cleared by the [place] FDA and has been authorized for detection and/or diagnosis of SARS-CoV-2 by FDA under an Emergency Use Authorization (EUA).  This EUA will remain in effect (meaning this test can be used) for the duration of the COVID-19 declaration under Section 564(b)(1) of the Act, 21 U.S.C. section 360bbb-3(b)(1), unless the  authorization is terminated or revoked sooner.  Performed at [facility], [address], [place], [place] [address]   Culture, blood (routine x 2)     Status: None (Preliminary result)   Collection Time: 05/15/20  9:33 PM   Specimen: BLOOD RIGHT FOREARM  Result Value Ref Range Status   Specimen Description BLOOD RIGHT FOREARM  Final   Special Requests   Final    BOTTLES DRAWN AEROBIC AND ANAEROBIC Blood Culture results may not be optimal due to an inadequate volume of blood received in culture bottles   Culture   Final    NO GROWTH < 12 HOURS Performed at [facility], [address] [place], [place] [address]    Report Status PENDING  Incomplete  Culture, blood (routine x 2)     Status: None (Preliminary result)   Collection Time: 05/15/20  9:37 PM   Specimen: BLOOD LEFT HAND  Result Value Ref Range Status   Specimen Description BLOOD LEFT HAND  Final   Special Requests   Final    BOTTLES DRAWN AEROBIC AND ANAEROBIC Blood Culture results may not be optimal due to an  inadequate volume of blood received in culture bottles   Culture   Final    NO GROWTH < 12 HOURS Performed at [facility], [address], [place], [place] [address]    Report Status PENDING  Incomplete  MRSA PCR Screening     Status: None   Collection Time: 05/16/20  5:03 PM   Specimen: Nasal Mucosa; Nasopharyngeal  Result Value Ref Range Status   MRSA by PCR NEGATIVE NEGATIVE Final    Comment:        The GeneXpert MRSA Assay (FDA approved for NASAL specimens only), is one component of a comprehensive MRSA colonization surveillance program. It is not intended to diagnose MRSA infection nor to guide or monitor treatment for MRSA infections. Performed at [facility], [place] [address] [place], [place] [address]          Radiology Studies: VAS US CAROTID  Result Date: 05/18/2020 Carotid Arterial Duplex Study Indications:       Bilateral bruits. Risk Factors:      Hypertension, hyperlipidemia,  Diabetes, current smoker, PAD. Comparison Study:  Prior study 03-06-20 showed bilateral ICA stenosis. Performing Technologist: Darlin Coco  Examination Guidelines: A complete evaluation includes B-mode imaging, spectral Doppler, color Doppler, and power Doppler as needed of all accessible portions of each vessel. Bilateral testing is considered an integral part of a complete examination. Limited examinations for reoccurring indications may be performed as noted.  Right Carotid Findings: +----------+--------+--------+--------+------------------+---------------------+           PSV cm/sEDV cm/sStenosisPlaque DescriptionComments              +----------+--------+--------+--------+------------------+---------------------+ CCA Prox  64      12              irregular and                                                             heterogenous                            +----------+--------+--------+--------+------------------+---------------------+ CCA Distal103     13              irregular and                                                             heterogenous                            +----------+--------+--------+--------+------------------+---------------------+ ICA Prox  121     22              irregular and                                                             calcific                                +----------+--------+--------+--------+------------------+---------------------+  ICA Mid   248     43      60-79%  irregular and     Velocities may be                                       calcific          underestimated due to                                                     calcific plaque       +----------+--------+--------+--------+------------------+---------------------+ ICA Distal112     13                                                      +----------+--------+--------+--------+------------------+---------------------+  ECA       135                                                             +----------+--------+--------+--------+------------------+---------------------+ +----------+--------+-------+----------------+-------------------+           PSV cm/sEDV cmsDescribe        Arm Pressure (mmHG) +----------+--------+-------+----------------+-------------------+ [id number]            Multiphasic, WNL                    +----------+--------+-------+----------------+-------------------+ +---------+--------+--+--------+--+---------+ VertebralPSV cm/s83EDV cm/s16Antegrade +---------+--------+--+--------+--+---------+  Left Carotid Findings: +----------+--------+--------+--------+------------------+---------------------+           PSV cm/sEDV cm/sStenosisPlaque DescriptionComments              +----------+--------+--------+--------+------------------+---------------------+ CCA Prox  105     17              irregular and                                                             heterogenous                            +----------+--------+--------+--------+------------------+---------------------+ CCA Mid                           irregular and                                                             heterogenous                            +----------+--------+--------+--------+------------------+---------------------+ CCA [id number]  15              irregular and                                                             heterogenous                            +----------+--------+--------+--------+------------------+---------------------+ ICA Prox  110     20              irregular and                                                             calcific                                +----------+--------+--------+--------+------------------+---------------------+ ICA Mid   288     71      60-79%  irregular and     Velocities may be                                        calcific          underestimated due to                                                     calcific plaque       +----------+--------+--------+--------+------------------+---------------------+ ICA Distal100     22                                                      +----------+--------+--------+--------+------------------+---------------------+ ECA       115                                                             +----------+--------+--------+--------+------------------+---------------------+ +----------+--------+--------+----------------+-------------------+           PSV cm/sEDV cm/sDescribe        Arm Pressure (mmHG) +----------+--------+--------+----------------+-------------------+ [id number]             Multiphasic, WNL                    +----------+--------+--------+----------------+-------------------+ +---------+--------+--+--------+--+---------+ VertebralPSV cm/s40EDV cm/s10Antegrade +---------+--------+--+--------+--+---------+   Summary: Right Carotid: 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 Non-hemodynamically significant plaque <50% noted in  the CCA. Left Carotid: Velocities in the left ICA are consistent with a 60-79% stenosis.               Non-hemodynamically significant plaque <50% noted in the CCA. Vertebrals:  Bilateral vertebral arteries demonstrate antegrade flow. Subclavians: Normal flow hemodynamics were seen in bilateral subclavian              arteries. *See table(s) above for measurements and observations.     Preliminary         Scheduled Meds: . amLODipine  10 mg Oral Daily  . docusate sodium  100 mg Oral BID  . escitalopram  10 mg Oral Daily  . ferrous sulfate  325 mg Oral Q breakfast  . heparin  5,000 Units Subcutaneous Q8H  . hydrochlorothiazide  25 mg Oral Daily  . insulin aspart  0-15 Units Subcutaneous TID WC  .  insulin aspart  0-5 Units Subcutaneous QHS  . lisinopril  40 mg Oral Daily  . mouth rinse  15 mL Mouth Rinse BID  . metoprolol tartrate  25 mg Oral BID  . montelukast  10 mg Oral QHS  . pantoprazole  40 mg Oral BID  . pravastatin  40 mg Oral QPM   Continuous Infusions: . sodium chloride 10 mL/hr at 05/17/20 1411  . lactated ringers 10 mL/hr at 05/17/20 0845  . meropenem (MERREM) IV 1 g (05/18/20 1512)  . methocarbamol (ROBAXIN) IV    . vancomycin 1,250 mg (05/18/20 1217)     LOS: 3 days    Time spent: 25 minutes    Dana Allan, MD  [facility] Pager #: [phone] 7PM-7AM contact night coverage as above

## 2020-05-18 NOTE — NC FL2 (Signed)
[place] LEVEL OF CARE SCREENING TOOL     IDENTIFICATION  Patient Name: Omar Todd Birthdate: [birth year] Sex: male Admission Date (Current Location): 05/15/2020  [facility] and [place] Number:  [employer] and Address:  [facility] [place], [place] [address], [place], [place] [address]      Provider Number: [id number]  Attending Physician Name and Address:  Bonnell Public, MD  Relative Name and Phone Number:       Current Level of Care: Hospital Recommended Level of Care: [place] Prior Approval Number:    Date Approved/Denied:   PASRR Number: [id number]  Discharge Plan: SNF    Current Diagnoses: Patient Active Problem List   Diagnosis Date Noted  . Pressure injury of skin 05/16/2020  . Severe protein-calorie malnutrition ([place])   . Gangrene of right foot ([place]) 03/26/2020  . PAD (peripheral artery disease) ([place]) 03/26/2020  . Anemia 03/12/2020  . GI bleed 03/12/2020  . AAA (abdominal aortic aneurysm) without rupture ([place]) 02/28/2020  . Critical lower limb ischemia 02/28/2020  . BPH (benign prostatic hyperplasia) 10/31/2014  . Systolic ejection murmur 56/21/3086  . Bilateral carotid bruits 10/31/2014  . Erectile dysfunction 11/05/2013  . Bilateral carotid artery disease ([place]) 04/11/2013  . Hyperlipidemia associated with type 2 diabetes mellitus ([place]) 04/11/2013  . Hypertension associated with diabetes ([place]) 04/11/2013  . COPD exacerbation ([place]) 04/11/2013  . Diabetes mellitus type 2 controlled 04/11/2013  . Diabetic neuropathy, type II diabetes mellitus ([place]) 03/15/2013    Orientation RESPIRATION BLADDER Height & Weight     Self, Time, Situation, Place  Normal Incontinent Weight: 176 lb 2.4 oz (79.9 kg) Height:  6\' 2"  (188 cm)  BEHAVIORAL SYMPTOMS/MOOD NEUROLOGICAL BOWEL NUTRITION STATUS      Continent Diet (see discharge summary)  AMBULATORY STATUS COMMUNICATION OF NEEDS Skin   Extensive Assist  Verbally PU Stage and Appropriate Care, Surgical wounds, Wound Vac (prevena vac; new BKA; unstageable PU on buttocks)       PU Stage 4 Dressing:  (unstageable PU on buttocks with foam PRN)               Personal Care Assistance Level of Assistance  Dressing, Bathing, Feeding Bathing Assistance: Limited assistance Feeding assistance: Independent Dressing Assistance: Limited assistance     Functional Limitations Info  Hearing, Sight, Speech Sight Info: Adequate Hearing Info: Adequate Speech Info: Adequate    SPECIAL CARE FACTORS FREQUENCY  OT (By licensed OT), PT (By licensed PT)     PT Frequency: 5x week OT Frequency: 5x a week            Contractures Contractures Info: Not present    Additional Factors Info  Allergies, Code Status, Psychotropic, Insulin Sliding Scale Code Status Info: Full Code Allergies Info: Penicillins Psychotropic Info: escitalopram (LEXAPRO) tablet 10 mg daily PO Insulin Sliding Scale Info: insulin aspart (novoLOG) injection 0-15 Units daily with meals; insulin aspart (novoLOG) injection 0-5 Units daily at bedtime       Current Medications (05/18/2020):  This is the current hospital active medication list Current Facility-Administered Medications  Medication Dose Route Frequency Provider Last Rate Last Admin  . 0.9 %  sodium chloride infusion   Intravenous Continuous Newt Minion, MD 10 mL/hr at 05/17/20 1411 New Bag at 05/17/20 1411  . acetaminophen (TYLENOL) suppository 650 mg  650 mg Rectal Q6H PRN Newt Minion, MD      . acetaminophen (TYLENOL) tablet 325-650 mg  325-650 mg Oral  Q6H PRN Newt Minion, MD      . amLODipine (NORVASC) tablet 10 mg  10 mg Oral Daily Newt Minion, MD   10 mg at 05/17/20 1040  . bisacodyl (DULCOLAX) suppository 10 mg  10 mg Rectal Daily PRN Newt Minion, MD      . docusate sodium (COLACE) capsule 100 mg  100 mg Oral BID Newt Minion, MD   100 mg at 05/17/20 2255  . escitalopram (LEXAPRO) tablet 10 mg   10 mg Oral Daily Newt Minion, MD   10 mg at 05/17/20 1040  . ferrous sulfate tablet 325 mg  325 mg Oral Q breakfast Newt Minion, MD   325 mg at 05/18/20 0816  . heparin injection 5,000 Units  5,000 Units Subcutaneous Q8H Newt Minion, MD   5,000 Units at 05/18/20 [phone]  . hydrochlorothiazide (HYDRODIURIL) tablet 25 mg  25 mg Oral Daily Newt Minion, MD   25 mg at 05/17/20 1039  . HYDROcodone-acetaminophen (NORCO/VICODIN) 5-325 MG per tablet 1-2 tablet  1-2 tablet Oral Q4H PRN Newt Minion, MD   1 tablet at 05/17/20 1719  . HYDROmorphone (DILAUDID) injection 0.5-1 mg  0.5-1 mg Intravenous Q4H PRN Newt Minion, MD   1 mg at 05/18/20 0100  . insulin aspart (novoLOG) injection 0-15 Units  0-15 Units Subcutaneous TID WC Newt Minion, MD   3 Units at 05/18/20 [phone]  . insulin aspart (novoLOG) injection 0-5 Units  0-5 Units Subcutaneous QHS Newt Minion, MD      . lactated ringers infusion   Intravenous Continuous Newt Minion, MD 10 mL/hr at 05/17/20 0845 Restarted at 05/17/20 0926  . lisinopril (ZESTRIL) tablet 40 mg  40 mg Oral Daily Newt Minion, MD   40 mg at 05/17/20 1039  . magnesium citrate solution 1 Bottle  1 Bottle Oral Once PRN Newt Minion, MD      . MEDLINE mouth rinse  15 mL Mouth Rinse BID Newt Minion, MD   15 mL at 05/17/20 2256  . meropenem (MERREM) 1 g in sodium chloride 0.9 % 100 mL IVPB  1 g Intravenous Q8H Dana Allan I, MD 200 mL/hr at 05/17/20 2304 1 g at 05/17/20 2304  . methocarbamol (ROBAXIN) tablet 500 mg  500 mg Oral Q6H PRN Newt Minion, MD       Or  . methocarbamol (ROBAXIN) 500 mg in dextrose 5 % 50 mL IVPB  500 mg Intravenous Q6H PRN Newt Minion, MD      . metoCLOPramide (REGLAN) tablet 5-10 mg  5-10 mg Oral Q8H PRN Newt Minion, MD       Or  . metoCLOPramide (REGLAN) injection 5-10 mg  5-10 mg Intravenous Q8H PRN Newt Minion, MD      . metoprolol tartrate (LOPRESSOR) tablet 25 mg  25 mg Oral BID Newt Minion, MD   25 mg at  05/17/20 2255  . montelukast (SINGULAIR) tablet 10 mg  10 mg Oral QHS Newt Minion, MD   10 mg at 05/17/20 2255  . ondansetron (ZOFRAN) tablet 4 mg  4 mg Oral Q6H PRN Newt Minion, MD       Or  . ondansetron [facility]) injection 4 mg  4 mg Intravenous Q6H PRN Newt Minion, MD      . oxyCODONE (Oxy IR/ROXICODONE) immediate release tablet 10-15 mg  10-15 mg Oral Q4H PRN Newt Minion, MD  10 mg at 05/18/20 0846  . oxyCODONE (Oxy IR/ROXICODONE) immediate release tablet 5-10 mg  5-10 mg Oral Q4H PRN Newt Minion, MD      . pantoprazole (PROTONIX) EC tablet 40 mg  40 mg Oral BID Newt Minion, MD   40 mg at 05/17/20 2255  . polyethylene glycol (MIRALAX / GLYCOLAX) packet 17 g  17 g Oral Daily PRN Newt Minion, MD      . pravastatin (PRAVACHOL) tablet 40 mg  40 mg Oral QPM Newt Minion, MD   40 mg at 05/17/20 1719  . vancomycin (VANCOREADY) IVPB 1250 mg/250 mL  1,250 mg Intravenous Q12H Dana Allan I, MD 166.7 mL/hr at 05/17/20 2301 1,250 mg at 05/17/20 2301     Discharge Medications: Please see discharge summary for a list of discharge medications.  Relevant Imaging Results:  Relevant Lab Results:   Additional Information SS[address][id number] [place], [place]

## 2020-05-18 NOTE — Progress Notes (Signed)
[name], male   DOB: [birth year], 69 y.o.   MRN: [id number] Patient is postoperative day 1 right transtibial amputation.  Patient denies any pain.  The wound VAC is functioning well no drainage.  Anticipate patient will need discharge to skilled nursing.

## 2020-05-18 NOTE — Progress Notes (Signed)
Carotid duplex bilateral study completed.   See Cv Proc for preliminary results.   Dolce Sylvia  

## 2020-05-18 NOTE — Progress Notes (Signed)
Pharmacy Antibiotic Note  Omar Todd is a 69 y.o. male admitted on 05/15/2020 with cellulitis.  Pharmacy has been consulted for Meropenem and Vancomycin dosing.  He has a creatinine of 0.74 and an estimated crcl is > 90 ml/min.  He currently is without fever and WBC = 12.6.  Per MD, his foot has ongoing gangrene which started back in January.   Patient is now s/p transtibial amputation. Per Ortho note antibiotics should be d/c'd 24 hours after surgery. Will f/u with primary for order to d/c.   Plan: Continue Vancomycin 1250 IV every 12 hours.  Goal trough 10-15 mcg/mL pending d/c  Continue Meropenem 1gm IV every 8 hours pending d/c Monitor renal function, clinical response  Height: 6\' 2"  (188 cm) Weight: 79.9 kg (176 lb 2.4 oz) IBW/kg (Calculated) : 82.2  Temp (24hrs), Avg:98.1 F (36.7 C), Min:97.4 F (36.3 C), Max:98.6 F (37 C)  Recent Labs  Lab 05/15/20 1920 05/16/20 0110 05/17/20 0344 05/18/20 0241  WBC 12.5* 12.0* 11.1* 12.6*  CREATININE 0.71 0.76 0.74  --     Estimated Creatinine Clearance: 98.5 mL/min (by C-G formula based on SCr of 0.74 mg/dL).    Allergies  Allergen Reactions  . Penicillins Other (See Comments)    Pt. States he "passed out"    Antimicrobials this admission: 6/25 Meropenem >> (6/28) 6/25 Vancomycin  >> (6/28)  Dose adjustments this admission:   Microbiology results:  Omar Todd, PharmD, BCPS, FNKF Clinical Pharmacist [place] Please utilize Amion for appropriate phone number to reach the unit pharmacist ([place])   care team. 05/18/2020 10:04 AM

## 2020-05-18 NOTE — Evaluation (Signed)
Physical Therapy Evaluation Patient Details Name: Omar Todd MRN: [id number] DOB: January 12, 1951 Today's Date: 05/18/2020   History of Present Illness  Omar Todd  is a 69 y.o. male, with medical history significant of COPD, hypertension, hyperlipidemia, DM type 2, COPD, anemia since hospitalization in April of this year secondary to acute GI bleed, secondary to chronic gastritis, from NSAIDs use, unsuccessful colonoscopy as unable to tolerate GoLYTELY, patient then left AMA prior evaluation by vascular surgery(vascular surgery awaiting GI clearance prior to intervention), and apparently patient with worsening gangrene, recently seen by GI Dr. Buford Dresser in [place], unsuccessful colonoscopy, due to poor bowel prep, patient was brought to ED by his sister due to worsening right lower foot ischemia and gangrene, this has been progressive issue for few month, he had poor follow-up with podiatry as an outpatient despite multiple attempts to contact him, patient denies fever, chills, chest pain, shortness of breath, melena or hematochezia.  Clinical Impression  Patient received in bed, pleasant, reports no pain. Agreeable to PT assessment. Patient requires min assist for supine >sit to raise trunk to seated position. Able to maintain sitting independently. Transfers sit to stand with min assist from elevated bed. He was able to hop 4-5 steps along side of bed with min assist and max cues for what I wanted him to do. He will continue to benefit from skilled PT while here to improve functional independence and safety with mobility.       Follow Up Recommendations SNF    Equipment Recommendations  Rolling walker with 5" wheels;Wheelchair cushion (measurements PT);Wheelchair (measurements PT)    Recommendations for Other Services OT consult     Precautions / Restrictions Restrictions Weight Bearing Restrictions: Yes RLE Weight Bearing: Non weight bearing      Mobility  Bed Mobility Overal bed  mobility: Needs Assistance Bed Mobility: Sit to Supine;Supine to Sit     Supine to sit: Min assist Sit to supine: Modified independent (Device/Increase time)   General bed mobility comments: min assist needed to raise trunk to sitting position  Transfers Overall transfer level: Needs assistance Equipment used: Rolling walker (2 wheeled) Transfers: Sit to/from Stand Sit to Stand: From elevated surface;Min assist         General transfer comment: patient requires cues for hand placement  Ambulation/Gait Ambulation/Gait assistance: Min assist Gait Distance (Feet): 4 Feet Assistive device: Rolling walker (2 wheeled) Gait Pattern/deviations: Step-to pattern;Decreased stride length Gait velocity: decr   General Gait Details: patient able to take 4-5 hops along side of bed. Returned to bed as patient needed to go to vascular. Some difficulty with following direction for what I wanted him to do.  Stairs            Wheelchair Mobility    Modified Rankin (Stroke Patients Only)       Balance Overall balance assessment: Needs assistance Sitting-balance support: Bilateral upper extremity supported;Feet supported Sitting balance-Leahy Scale: Good     Standing balance support: Bilateral upper extremity supported;During functional activity Standing balance-Leahy Scale: Fair Standing balance comment: reliant on B UE and external assist for safety                             Pertinent Vitals/Pain Pain Assessment: No/denies pain    Home Living Family/patient expects to be discharged to:: Private residence Living Arrangements: Alone Available Help at Discharge: Family;Friend(s);Available PRN/intermittently Type of Home: Mobile home Home Access: Stairs to enter   Entrance Stairs-Number  of Steps: supposedly getting ramp Home Layout: One level Home Equipment: Walker - standard      Prior Function Level of Independence: Independent with assistive device(s)                Hand Dominance        Extremity/Trunk Assessment   Upper Extremity Assessment Upper Extremity Assessment: Defer to OT evaluation    Lower Extremity Assessment Lower Extremity Assessment: Generalized weakness    Cervical / Trunk Assessment Cervical / Trunk Assessment: Normal  Communication   Communication: No difficulties  Cognition Arousal/Alertness: Awake/alert Behavior During Therapy: WFL for tasks assessed/performed Overall Cognitive Status: No family/caregiver present to determine baseline cognitive functioning                                 General Comments: Patient is pleasant, decreased awareness of limitations      General Comments      Exercises Amputee Exercises Quad Sets: AROM;Right;5 reps Other Exercises Other Exercises: patient tends to keep right knee in flexed position. Educated patient in keeping knee extended   Assessment/Plan    PT Assessment Patient needs continued PT services  PT Problem List Decreased strength;Decreased mobility;Decreased safety awareness;Decreased activity tolerance;Decreased balance;Decreased knowledge of precautions       PT Treatment Interventions DME instruction;Therapeutic activities;Gait training;Therapeutic exercise;Patient/family education;Balance training;Functional mobility training;Neuromuscular re-education    PT Goals (Current goals can be found in the Care Plan section)  Acute Rehab PT Goals Patient Stated Goal: patient agreeable to go to rehab PT Goal Formulation: With patient Time For Goal Achievement: 06/01/20 Potential to Achieve Goals: Good    Frequency Min 3X/week   Barriers to discharge Decreased caregiver support;Inaccessible home environment has stairs currently and lives alone with family/friends who come each day    Co-evaluation               AM-PAC PT "6 Clicks" Mobility  Outcome Measure Help needed turning from your back to your side while in a  flat bed without using bedrails?: None Help needed moving from lying on your back to sitting on the side of a flat bed without using bedrails?: A Little Help needed moving to and from a bed to a chair (including a wheelchair)?: A Little Help needed standing up from a chair using your arms (e.g., wheelchair or bedside chair)?: A Lot Help needed to walk in hospital room?: A Lot Help needed climbing 3-5 steps with a railing? : A Lot 6 Click Score: 16    End of Session Equipment Utilized During Treatment: Gait belt Activity Tolerance: Patient tolerated treatment well Patient left: in bed;with bed alarm set Nurse Communication: Mobility status PT Visit Diagnosis: Unsteadiness on feet (R26.81);Other abnormalities of gait and mobility (R26.89);Muscle weakness (generalized) (M62.81);Difficulty in walking, not elsewhere classified (R26.2)    Time: 4888-9169 PT Time Calculation (min) (ACUTE ONLY): 20 min   Charges:   PT Evaluation $PT Eval Moderate Complexity: 1 Mod PT Treatments $Gait Training: 8-22 mins        Pollie Poma, PT, GCS 05/18/20,10:03 AM

## 2020-05-19 DIAGNOSIS — E43 Unspecified severe protein-calorie malnutrition: Secondary | ICD-10-CM

## 2020-05-19 LAB — GLUCOSE, CAPILLARY
Glucose-Capillary: 182 mg/dL — ABNORMAL HIGH (ref 70–99)
Glucose-Capillary: 157 mg/dL — ABNORMAL HIGH (ref 70–99)
Glucose-Capillary: 235 mg/dL — ABNORMAL HIGH (ref 70–99)
Glucose-Capillary: 324 mg/dL — ABNORMAL HIGH (ref 70–99)

## 2020-05-19 LAB — SARS CORONAVIRUS 2 (TAT 6-24 HRS): SARS Coronavirus 2: NEGATIVE

## 2020-05-19 LAB — MAGNESIUM: Magnesium: 1.6 mg/dL — ABNORMAL LOW (ref 1.7–2.4)

## 2020-05-19 MED ORDER — ASPIRIN EC 81 MG PO TBEC
81.0000 mg | DELAYED_RELEASE_TABLET | Freq: Every day | ORAL | Status: DC
  Administered 2020-05-19 – 2020-05-20 (×2): 81 mg via ORAL
  Filled 2020-05-19 (×2): qty 1

## 2020-05-19 MED ORDER — ADULT MULTIVITAMIN W/MINERALS CH
1.0000 | ORAL_TABLET | Freq: Every day | ORAL | Status: DC
  Administered 2020-05-19 – 2020-05-20 (×2): 1 via ORAL
  Filled 2020-05-19 (×2): qty 1

## 2020-05-19 MED ORDER — ENSURE MAX PROTEIN PO LIQD
11.0000 [oz_av] | Freq: Two times a day (BID) | ORAL | Status: DC
  Administered 2020-05-19 – 2020-05-20 (×2): 11 [oz_av] via ORAL
  Filled 2020-05-19 (×3): qty 330

## 2020-05-19 NOTE — TOC Progression Note (Addendum)
Transition of Care [facility]) - Progression Note    Patient Details  Name: Omar Todd MRN: [id number] Date of Birth: 03-May-1951  [facility] CM/SW [place], [place] Phone Number: 05/19/2020, 10:54 AM  Clinical Narrative:    3:20pm- COVID pending, await insurance auth.   12:44pm- Spoke with [name], she is aware pt now agreeable, likely d/c tomorrow pending auth and COVID.  12:30pm- CSW met with pt at bedside. Pt now agreeable to SNF, preferred SNF is [facility]. He is aware we will need new COVID swab.   10:54am- CSW met with pt at bedside. Reintroduced self, role, reason for visit. Pt provided with CMS rating packet for two offers: [facility] and [facility]. COVID test swab needs to be collected (RN Broadus John aware). Pt sharing with this Probation officer that he wants to go home. CSW inquired as to if pt would have assistance from friends/family at home. Pt states that he has his sisters and friends that can help. He allows me to call [name] at bedside. CSW called [name] and we all spoke with her on speakerphone [phone]). [name] aware pt wants to go home. She is encouraging that pt needs help before returning home as his family has other obligations that prevent them from being there to physically assist pt 24/7. Pt appears to be worried that this will become a LTC placement like his wife is in. We discussed that this initial referral is for STR and that if he needed longer than his Medicare would cover that would be a conversation between SNF, him and his family.   Pt still wanting to go home, requested family discuss this and that CSW will return back for a decision together at 12:30pm. Pt and pt sister [name] aware, she states she will have their sister Inez Catalina also call and discuss with pt.     Expected Discharge Plan: Skilled Nursing Facility Barriers to Discharge: Ship broker, Continued Medical Work up  Expected Discharge Plan and  Services Expected Discharge Plan: [place] In-house Referral: Clinical Social Work Discharge Planning Services: CM Consult Post Acute Care Choice: [place] Living arrangements for the past 2 months: Apartment   Readmission Risk Interventions Readmission Risk Prevention Plan 05/18/2020  Transportation Screening Complete  PCP or Specialist Appt within 3-5 Days Not Complete  Not Complete comments plan for SNF  HRI or Home Care Consult Complete  Social Work Consult for [place] Planning/Counseling Complete  Palliative Care Screening Not Complete  Palliative Care Screening Not Complete Comments could be appropriate  Medication Review (RN Transport planner) Referral to Pharmacy  Some recent data might be hidden

## 2020-05-19 NOTE — Progress Notes (Signed)
   VASCULAR SURGERY ASSESSMENT & PLAN:   BILATERAL CAROTID STENOSES: This patient has asymptomatic bilateral 60 to 79% carotid stenosis.  I will arrange a follow-up carotid duplex scan in my office in 6 months.  He is on a statin.  I have added 81 mg of aspirin daily.  ABDOMINAL AORTIC ANEURYSM: He has a 4.7 cm infrarenal abdominal aortic aneurysm.  I will arrange a follow-up ultrasound in my office in 6 months.  We would normally consider elective repair in a normal risk patient at 5.5 cm.  PERIPHERAL VASCULAR DISEASE: He has now undergone a transtibial amputation by Dr. Sharol Given on the right.  We will follow his peripheral vascular disease on the left as an outpatient.  Vascular surgery will be available as needed.  SUBJECTIVE:   No complaints  PHYSICAL EXAM:   Vitals:   05/18/20 0527 05/18/20 1335 05/18/20 1957 05/19/20 0435  BP: 137/69 (!) 125/59 138/67 (!) 155/35  Pulse: 73 69 78 75  Resp: 17 17 18 18   Temp: 98.6 F (37 C) 98.2 F (36.8 C) 98.2 F (36.8 C) 98.2 F (36.8 C)  TempSrc: Oral Oral Oral Oral  SpO2: 94% 96% 95% 91%  Weight:      Height:       NEURO: No focal weakness or paresthesias.  LABS:   CAROTID DUPLEX: I have reviewed the carotid duplex scanning done yesterday.  The patient has bilateral 60 to 79% carotid stenosis.  Both vertebral arteries are patent with antegrade flow.  CBG (last 3)  Recent Labs    05/18/20 1717 05/18/20 2000 05/19/20 0737  GLUCAP 244* 152* 182*    PROBLEM LIST:    Active Problems:   Diabetic neuropathy, type II diabetes mellitus ([place])   Hyperlipidemia associated with type 2 diabetes mellitus ([place])   Hypertension associated with diabetes ([place])   Diabetes mellitus type 2 controlled   BPH (benign prostatic hyperplasia)   AAA (abdominal aortic aneurysm) without rupture (HCC)   Critical lower limb ischemia   Anemia   Gangrene of right foot (HCC)   PAD (peripheral artery disease) (HCC)   Severe protein-calorie malnutrition  (HCC)   Pressure injury of skin   CURRENT MEDS:   . amLODipine  10 mg Oral Daily  . docusate sodium  100 mg Oral BID  . escitalopram  10 mg Oral Daily  . ferrous sulfate  325 mg Oral Q breakfast  . heparin  5,000 Units Subcutaneous Q8H  . hydrochlorothiazide  25 mg Oral Daily  . insulin aspart  0-15 Units Subcutaneous TID WC  . insulin aspart  0-5 Units Subcutaneous QHS  . lisinopril  40 mg Oral Daily  . mouth rinse  15 mL Mouth Rinse BID  . metoprolol tartrate  25 mg Oral BID  . montelukast  10 mg Oral QHS  . pantoprazole  40 mg Oral BID  . pravastatin  40 mg Oral QPM    Deitra Mayo Office: [phone] 05/19/2020

## 2020-05-19 NOTE — Progress Notes (Signed)
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  Patient was admitted with critical limb ischemia and extensive dry gangrene right foot.  Patient has undergone right BKA.  Patient is awaiting discharge to skilled nursing facility.  Input from vascular and orthopedic surgery team is highly appreciated.  Patient is stable for discharge.  Patient will be discharged when a bed is available.  Assessment & Plan:   Active Problems:   Diabetic neuropathy, type II diabetes mellitus ([place])   Hyperlipidemia associated with type 2 diabetes mellitus ([place])   Hypertension associated with diabetes ([place])   Diabetes mellitus type 2 controlled   BPH (benign prostatic hyperplasia)   AAA (abdominal aortic aneurysm) without rupture (H[facility])   Critical lower limb ischemia   Anemia   Gangrene of right foot (HCC)   PAD (peripheral artery disease) (H[facility])   Severe protein-calorie malnutrition (H[facility])   Pressure injury of skin  Critical limb ischemia/right foot gangrene: -Patient with dry/wet gangrene of right foot, with diminished pulses, will start empirically on IV vancomycin and meropenem for now, will obtain blood cultures. -Patient will need evaluation by vascular surgery given peripheral arterial disease bilaterally, and certainly will need revascularization addressed prior to amputation, patient will be transferred to [facility] for  vascular surgery evaluation. -Patient left AMA during his recent hospitalization in April of this year before work-up was done. 05/16/2020: For right transtibial amputation tomorrow. 05/18/2020: Patient underwent right BKA.  Pursue disposition when a bed is available.  COVID-19 test ordered.  No new complaints today. 05/19/2020: Patient is stable for discharge.  Pursue discharge to skilled nursing facility.  Microcytic anemia: -Patient with questionable history of GI bleed, endoscopy in April this year significant for chronic gastritis, colonoscopy 6/16 was aborted given poor bowel prep. -Patient will need colonoscopy done prior to intervention his lower extremity and revascularization to rule out GI bleed. -We will keep on clear liquid diet, will have morning team consult GI to perform colonoscopy prior to revascularization/amputation. 05/16/2020: Transfuse patient with packed red blood cells as needed.  Patient would like to follow with GI on outpatient basis to complete colonoscopy prep and screening.  AAA -Seen in most recent CT angio on 03/12/2020, this is to be evaluated by vascular surgery at [facility]. 05/18/2020: Vascular surgery team is following patient.  Diabetes mellitus -Hold oral regimen, will start on insulin sliding scale during hospital stay. 05/18/2020: Continue to monitor closely.  Hypertension -Continue with home medications 05/17/2020: Continue to optimize.  Goal blood pressure should be less than 130/80 mmHg.  COPD and ongoing tobacco abuse -No wheezing, no hypoxia  Mild to moderate aortic stenosis -echo from 03/13/2020 noted,EF is preserved at 50 to 55%  DVT prophylaxis: Subcutaneous heparin Code Status: Full code Family Communication: 2 sisters  disposition Plan: This will depend on hospital course.   Consultants:  Vascular surgery Orthopedic surgeon  Procedures:   For right transtibial amputation tomorrow.  Antimicrobials:   IV vancomycin  discontinued  IV meropenem discontinued  Subjective: No new complaints  Objective: Vitals:   05/18/20 1335 05/18/20 1957 05/19/20 0435 05/19/20 1431  BP: (!) 125/59 138/67 (!) 155/35 (!) 116/56  Pulse: 69 78 75 79  Resp: 17 18 18 17   Temp: 98.2 F (36.8 C) 98.2 F (36.8 C) 98.2 F (36.8 C) 98.1 F (36.7 C)  TempSrc: Oral Oral Oral Oral  SpO2: 96% 95% 91% 97%  Weight:      Height:        Intake/Output Summary (Last 24 hours) at 05/19/2020 1958 Last data filed at 05/19/2020 1500 Gross per 24 hour  Intake 600 ml  Output 2450 ml  Net -1850 ml   Filed Weights   05/15/20 1705 05/16/20 0019  Weight: 90 kg 79.9 kg    Examination:  General exam: Appears calm and comfortable.  Patient underwent right BKA today. Respiratory system: Clear to auscultation.  Cardiovascular system: S1 & S2, systolic murmur  Gatrointestinal system: Abdomen is nondistended, soft and nontender. No organomegaly or masses felt. Normal bowel sounds heard. Central nervous system: Alert and oriented. No focal neurological deficits. Extremities: Status post right BKA.  Data Reviewed: I have personally reviewed following labs and imaging studies  CBC: Recent Labs  Lab 05/15/20 1920 05/16/20 0110 05/17/20 0344 05/18/20 0241  WBC 12.5* 12.0* 11.1* 12.6*  NEUTROABS 10.3*  --  9.2* 11.2*  HGB 8.3* 7.7* 7.9* 7.8*  HCT 28.8* 27.1* 27.6* 27.7*  MCV 78.5* 77.7* 77.1* 77.2*  PLT 596* 544* 548* 784*   Basic Metabolic Panel: Recent Labs  Lab 05/15/20 1920 05/16/20 0110 05/17/20 0344 05/19/20 0225  NA 136 136 132*  --   K 3.4* 3.4* 3.8  --   CL 99 98 97*  --   CO2 28 27 26   --   GLUCOSE 141* 181* 152*  --   BUN 14 8 10   --   CREATININE 0.71 0.76 0.74  --   CALCIUM 8.6* 8.5* 8.3*  --   MG  --   --  1.6* 1.6*  PHOS  --   --  2.9  --    GFR: Estimated Creatinine Clearance: 98.5 mL/min (by C-G formula based on SCr of 0.74 mg/dL). Liver Function Tests: Recent Labs  Lab 05/15/20 1920  05/17/20 0344  AST 20  --   ALT 22  --   ALKPHOS 134*  --   BILITOT 0.3  --   PROT 6.6  --   ALBUMIN 2.2* 1.8*   No results for input(s): LIPASE, AMYLASE in the last 168 hours. No results for input(s): AMMONIA in the last 168 hours. Coagulation Profile: Recent Labs  Lab 05/15/20 1920  INR 1.1   Cardiac Enzymes: No results for input(s): CKTOTAL, CKMB, CKMBINDEX, TROPONINI in the last 168 hours. BNP (last 3 results) No results for input(s): PROBNP in the last 8760 hours. HbA1C: No results for input(s): HGBA1C in the last 72 hours. CBG: Recent Labs  Lab 05/18/20 1717 05/18/20 2000 05/19/20 0737 05/19/20 1209 05/19/20 1650  GLUCAP 244* 152* 182* 157* 235*   Lipid Profile: No results for input(s): CHOL, HDL, LDLCALC, TRIG, CHOLHDL, LDLDIRECT in the last 72 hours. Thyroid Function Tests: No results for input(s): TSH, T4TOTAL, FREET4, T3FREE, THYROIDAB in the last 72 hours. Anemia Panel: No results for input(s): VITAMINB12, FOLATE, FERRITIN, TIBC, IRON, RETICCTPCT in the last 72 hours. Urine analysis:    Component Value Date/Time   APPEARANCEUR Clear 08/08/2018 1119   GLUCOSEU Negative 08/08/2018 1119   BILIRUBINUR Negative 08/08/2018 1119  PROTEINUR Negative 08/08/2018 1119   UROBILINOGEN negative 10/31/2014 1002   NITRITE Negative 08/08/2018 1119   LEUKOCYTESUR Negative 08/08/2018 1119   Sepsis Labs: @LABRCNTIP (procalcitonin:4,lacticidven:4)  ) Recent Results (from the past 240 hour(s))  SARS Coronavirus 2 by RT PCR (hospital order, performed in [facility] hospital lab) Nasopharyngeal Nasopharyngeal Swab     Status: None   Collection Time: 05/15/20  7:20 PM   Specimen: Nasopharyngeal Swab  Result Value Ref Range Status   SARS Coronavirus 2 NEGATIVE NEGATIVE Final    Comment: (NOTE) SARS-CoV-2 target nucleic acids are NOT DETECTED.  The SARS-CoV-2 RNA is generally detectable in upper and lower respiratory specimens during the acute phase of infection. The  lowest concentration of SARS-CoV-2 viral copies this assay can detect is 250 copies / mL. A negative result does not preclude SARS-CoV-2 infection and should not be used as the sole basis for treatment or other patient management decisions.  A negative result may occur with improper specimen collection / handling, submission of specimen other than nasopharyngeal swab, presence of viral mutation(s) within the areas targeted by this assay, and inadequate number of viral copies (<250 copies / mL). A negative result must be combined with clinical observations, patient history, and epidemiological information.  Fact Sheet for Patients:   StrictlyIdeas.no  Fact Sheet for Healthcare Providers: BankingDealers.co.za  This test is not yet approved or  cleared by the [place] FDA and has been authorized for detection and/or diagnosis of SARS-CoV-2 by FDA under an Emergency Use Authorization (EUA).  This EUA will remain in effect (meaning this test can be used) for the duration of the COVID-19 declaration under Section 564(b)(1) of the Act, 21 U.S.C. section 360bbb-3(b)(1), unless the authorization is terminated or revoked sooner.  Performed at [facility], [address] [place], [place] [address]   Culture, blood (routine x 2)     Status: None (Preliminary result)   Collection Time: 05/15/20  9:33 PM   Specimen: BLOOD RIGHT FOREARM  Result Value Ref Range Status   Specimen Description BLOOD RIGHT FOREARM  Final   Special Requests   Final    BOTTLES DRAWN AEROBIC AND ANAEROBIC Blood Culture results may not be optimal due to an inadequate volume of blood received in culture bottles   Culture   Final    NO GROWTH 4 DAYS Performed at [facility], [address], [place], [place] [address]    Report Status PENDING  Incomplete  Culture, blood (routine x 2)     Status: None (Preliminary result)   Collection Time: 05/15/20  9:37 PM    Specimen: BLOOD LEFT HAND  Result Value Ref Range Status   Specimen Description BLOOD LEFT HAND  Final   Special Requests   Final    BOTTLES DRAWN AEROBIC AND ANAEROBIC Blood Culture results may not be optimal due to an inadequate volume of blood received in culture bottles   Culture   Final    NO GROWTH 4 DAYS Performed at [facility], [address], [place], [place] [address]    Report Status PENDING  Incomplete  MRSA PCR Screening     Status: None   Collection Time: 05/16/20  5:03 PM   Specimen: Nasal Mucosa; Nasopharyngeal  Result Value Ref Range Status   MRSA by PCR NEGATIVE NEGATIVE Final    Comment:        The GeneXpert MRSA Assay (FDA approved for NASAL specimens only), is one component of a comprehensive MRSA colonization surveillance program. It is  not intended to diagnose MRSA infection nor to guide or monitor treatment for MRSA infections. Performed at [facility], [place] [address], [place], [place] [address]   SARS CORONAVIRUS 2 (TAT 6-24 HRS) Nasopharyngeal Nasopharyngeal Swab     Status: None   Collection Time: 05/19/20  1:59 PM   Specimen: Nasopharyngeal Swab  Result Value Ref Range Status   SARS Coronavirus 2 NEGATIVE NEGATIVE Final    Comment: (NOTE) SARS-CoV-2 target nucleic acids are NOT DETECTED.  The SARS-CoV-2 RNA is generally detectable in upper and lower respiratory specimens during the acute phase of infection. Negative results do not preclude SARS-CoV-2 infection, do not rule out co-infections with other pathogens, and should not be used as the sole basis for treatment or other patient management decisions. Negative results must be combined with clinical observations, patient history, and epidemiological information. The expected result is Negative.  Fact Sheet for Patients: SugarRoll.be  Fact Sheet for Healthcare Providers: https://www.woods-mathews.com/  This test is not yet approved or  cleared by the [place] FDA and  has been authorized for detection and/or diagnosis of SARS-CoV-2 by FDA under an Emergency Use Authorization (EUA). This EUA will remain  in effect (meaning this test can be used) for the duration of the COVID-19 declaration under Se ction 564(b)(1) of the Act, 21 U.S.C. section 360bbb-3(b)(1), unless the authorization is terminated or revoked sooner.  Performed at [facility], [place] [address] [place], [place] [address]          Radiology Studies: VAS US CAROTID  Result Date: 05/19/2020 Carotid Arterial Duplex Study Indications:       Bilateral bruits. Risk Factors:      Hypertension, hyperlipidemia, Diabetes, current smoker, PAD. Comparison Study:  Prior study 03-06-20 showed bilateral ICA stenosis. Performing Technologist: Darlin Coco  Examination Guidelines: A complete evaluation includes B-mode imaging, spectral Doppler, color Doppler, and power Doppler as needed of all accessible portions of each vessel. Bilateral testing is considered an integral part of a complete examination. Limited examinations for reoccurring indications may be performed as noted.  Right Carotid Findings: +----------+--------+--------+--------+------------------+---------------------+           PSV cm/sEDV cm/sStenosisPlaque DescriptionComments              +----------+--------+--------+--------+------------------+---------------------+ CCA Prox  64      12              irregular and                                                             heterogenous                            +----------+--------+--------+--------+------------------+---------------------+ CCA Distal103     13              irregular and                                                             heterogenous                            +----------+--------+--------+--------+------------------+---------------------+  ICA Prox  121     22              irregular and                                                              calcific                                +----------+--------+--------+--------+------------------+---------------------+ ICA Mid   248     43      60-79%  irregular and     Velocities may be                                       calcific          underestimated due to                                                     calcific plaque       +----------+--------+--------+--------+------------------+---------------------+ ICA Distal112     13                                                      +----------+--------+--------+--------+------------------+---------------------+ ECA       135                                                             +----------+--------+--------+--------+------------------+---------------------+ +----------+--------+-------+----------------+-------------------+           PSV cm/sEDV cmsDescribe        Arm Pressure (mmHG) +----------+--------+-------+----------------+-------------------+ [id number]            Multiphasic, WNL                    +----------+--------+-------+----------------+-------------------+ +---------+--------+--+--------+--+---------+ VertebralPSV cm/s83EDV cm/s16Antegrade +---------+--------+--+--------+--+---------+  Left Carotid Findings: +----------+--------+--------+--------+------------------+---------------------+           PSV cm/sEDV cm/sStenosisPlaque DescriptionComments              +----------+--------+--------+--------+------------------+---------------------+ CCA Prox  105     17              irregular and                                                             heterogenous                            +----------+--------+--------+--------+------------------+---------------------+ CCA Mid  irregular and                                                              heterogenous                            +----------+--------+--------+--------+------------------+---------------------+ CCA Distal111     15              irregular and                                                             heterogenous                            +----------+--------+--------+--------+------------------+---------------------+ ICA Prox  110     20              irregular and                                                             calcific                                +----------+--------+--------+--------+------------------+---------------------+ ICA Mid   288     71      60-79%  irregular and     Velocities may be                                       calcific          underestimated due to                                                     calcific plaque       +----------+--------+--------+--------+------------------+---------------------+ ICA Distal100     22                                                      +----------+--------+--------+--------+------------------+---------------------+ ECA       115                                                             +----------+--------+--------+--------+------------------+---------------------+ +----------+--------+--------+----------------+-------------------+           PSV  cm/sEDV cm/sDescribe        Arm Pressure (mmHG) +----------+--------+--------+----------------+-------------------+ [id number]             Multiphasic, WNL                    +----------+--------+--------+----------------+-------------------+ +---------+--------+--+--------+--+---------+ VertebralPSV cm/s40EDV cm/s10Antegrade +---------+--------+--+--------+--+---------+   Summary: Right Carotid: 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 Non-hemodynamically significant plaque <50% noted in                the CCA. Left Carotid: Velocities in  the left ICA are consistent with a 60-79% stenosis.               Non-hemodynamically significant plaque <50% noted in the CCA. Vertebrals:  Bilateral vertebral arteries demonstrate antegrade flow. Subclavians: Normal flow hemodynamics were seen in bilateral subclavian              arteries. *See table(s) above for measurements and observations.  Electronically signed by Deitra Mayo MD on 05/19/2020 at 8:54:24 AM.    Final         Scheduled Meds: . amLODipine  10 mg Oral Daily  . aspirin EC  81 mg Oral Daily  . docusate sodium  100 mg Oral BID  . escitalopram  10 mg Oral Daily  . ferrous sulfate  325 mg Oral Q breakfast  . heparin  5,000 Units Subcutaneous Q8H  . hydrochlorothiazide  25 mg Oral Daily  . insulin aspart  0-15 Units Subcutaneous TID WC  . insulin aspart  0-5 Units Subcutaneous QHS  . lisinopril  40 mg Oral Daily  . mouth rinse  15 mL Mouth Rinse BID  . metoprolol tartrate  25 mg Oral BID  . montelukast  10 mg Oral QHS  . multivitamin with minerals  1 tablet Oral Daily  . pantoprazole  40 mg Oral BID  . pravastatin  40 mg Oral QPM  . Ensure Max Protein  11 oz Oral BID   Continuous Infusions: . sodium chloride 10 mL/hr at 05/17/20 1411  . lactated ringers 10 mL/hr at 05/17/20 0845  . methocarbamol (ROBAXIN) IV       LOS: 4 days    Time spent: 25 minutes    Dana Allan, MD  [facility] Pager #: [phone] 7PM-7AM contact night coverage as above

## 2020-05-19 NOTE — Progress Notes (Signed)
Initial Nutrition Assessment  DOCUMENTATION CODES:   Not applicable  INTERVENTION:   -D/c calorie count -Ensure Max po BID, each supplement provides 150 kcal and 30 grams of protein.  -MVI with minerals daily -Magic cup BID with meals, each supplement provides 290 kcal and 9 grams of protein  NUTRITION DIAGNOSIS:   Increased nutrient needs related to post-op healing as evidenced by estimated needs.  GOAL:   Patient will meet greater than or equal to 90% of their needs  MONITOR:   PO intake, Supplement acceptance, Labs, Weight trends, Skin, I & O's  REASON FOR ASSESSMENT:   Consult Assessment of nutrition requirement/status, Calorie Count  ASSESSMENT:   Omar Todd  is a 69 y.o. male, with medical history significant of COPD, hypertension, hyperlipidemia, DM type 2, COPD, anemia since hospitalization in April of this year secondary to acute GI bleed, secondary to chronic gastritis, from NSAIDs use, unsuccessful colonoscopy as unable to tolerate GoLYTELY, patient then left AMA prior evaluation by vascular surgery(vascular surgery awaiting GI clearance prior to intervention), and apparently patient with worsening gangrene, recently seen by GI Dr. Buford Dresser in [place], unsuccessful colonoscopy, due to poor bowel prep, patient was brought to ED by his sister due to worsening right lower foot ischemia and gangrene  Pt admitted with critical limb ischemia and rt foot gangrene.   6/27- s/p PROCEDURE: Transtibial amputation Application of Prevena wound VAC  Reviewed I/O's: -640 ml x 24 hours and -2.7 L since admission  UOP: 1.6 L x 24 hours  Spoke with pt at bedside, who was pleasant and in good spirits today. Pt reports that he has a great appetite, consuming 100% of of meals meals (confirmed by doc flowsheets). MD put in calorie count, however, RD d/c secondary to pt eating all his meals and no evidence of malnutrition.   Pt reports that he usually consumes 3 meals and 1 snack  daily PTA (Breakfast: cereal OR eggs, toast, and sausage, Lunch and Dinner: chicken, hamburgers and fries, or pizza, Snacks: pack of nabs and soda). Pt shares that his family members often help him with preparing meals.   Pt denies any weight loss. He reports UBW is around 200#. He s very motivated to participate in therapy- he reports he was very active PTA and enjoyed working out at [employer].   Per pt, he developed an ulcer on his large toe when "my doctor removed a small callous on my big toe" back in January. Per his report, it continued to look worse over time.   Discussed with pt importance of good meal and supplement intake to promote healing. Reinforced importance of good glycemic control to optimize post-operative healing.   Per TOC notes, recommendations for short term SNF rehab.  Lab Results  Component Value Date   HGBA1C 8.3 (H) 05/15/2020   PTA DM medications are 1000 mg metformin BID and 100 mg sitagliptin daily. Discussed home DM control with pt. Per pt, he checks his CBGS regularly and typically obtain readings from 120-140. He admits that readings can be high on occasion "depending on what I eat". He is complaint with his medications and does not have difficulty obtaining them.   Labs reviewed: CBGS: 152-244 (inpatient orders for glycemic control are 0-15 units insulin aspart TID with meals and 0-5 units insulin aspart daily at bedtime).   NUTRITION - FOCUSED PHYSICAL EXAM:    Most Recent Value  Orbital Region No depletion  Upper Arm Region No depletion  Thoracic and Lumbar Region No  depletion  Buccal Region No depletion  Temple Region Mild depletion  Clavicle Bone Region No depletion  Clavicle and Acromion Bone Region No depletion  Scapular Bone Region No depletion  Dorsal Hand No depletion  Patellar Region No depletion  Anterior Thigh Region No depletion  Posterior Calf Region No depletion  Edema (RD Assessment) Mild  Hair Reviewed  Eyes Reviewed  Mouth Reviewed   Skin Reviewed  Nails Reviewed       Diet Order:   Diet Order            Diet Carb Modified Fluid consistency: Thin; Room service appropriate? Yes  Diet effective now                 EDUCATION NEEDS:   Education needs have been addressed  Skin:  Skin Assessment: Skin Integrity Issues: Skin Integrity Issues:: Unstageable, Stage II, Wound VAC Stage II: lt buttocks Unstageable: rt buttocks Wound Vac: rt leg s/p BKA  Last BM:  05/16/20  Height:   Ht Readings from Last 1 Encounters:  05/16/20 6\' 2"  (1.88 m)    Weight:   Wt Readings from Last 1 Encounters:  05/16/20 79.9 kg    Ideal Body Weight:  80.8 kg (adjusted for rt BKA)  BMI:  Body mass index is 22.62 kg/m.  Estimated Nutritional Needs:   Kcal:  2300-2500  Protein:  120-135 grams  Fluid:  > 2.3 L    Loistine Chance, RD, LDN, [place] Registered Dietitian II Certified Diabetes Care and Education Specialist Please refer to [facility] for RD and/or RD on-call/weekend/after hours pager

## 2020-05-19 NOTE — Progress Notes (Signed)
Physical Therapy Treatment Patient Details Name: Omar Todd MRN: [id number] DOB: June 01, 1951 Today's Date: 05/19/2020    History of Present Illness Pt is a 69 yr old male who presented to ED with gangrenous R foot after poor f/u care from previous admissions.  Pt also with a history of leving hospital AMA.  He now reports to [facility] for surgical intervention.  S/p R transtibial amputation with vac placement on 05/17/20.  PMH: COPD, hypertension, hyperlipidemia, DM type 2, anemia, chronic gastritis and GI bleed.    PT Comments    Pt seated in recliner with B LEs in dependent position.  He presents with R knee flexion contracture and surgical MD informed.  MD reports this to be due to vac placement and will d/c vac tomorrow.  PTA focused on RLE stretching into extension and educated on need for resting in supported extension.  Not sure if patient is receiving information as serious.  Continue to recommend SNF placement at d/c.      Follow Up Recommendations  SNF     Equipment Recommendations  Rolling walker with 5" wheels;Wheelchair cushion (measurements PT);Wheelchair (measurements PT)    Recommendations for Other Services       Precautions / Restrictions Precautions Precautions:  (contracture forming in R knee- encourage extension) Required Braces or Orthoses: Other Brace Other Brace: shrinker and limb guard. Restrictions Weight Bearing Restrictions: Yes RLE Weight Bearing: Non weight bearing    Mobility  Bed Mobility               General bed mobility comments: Pt seated in recliner on arrival.  B LE in dependent position flexed over chair at the knee.  Transfers Overall transfer level: Needs assistance Equipment used: Rolling walker (2 wheeled) Transfers: Sit to/from Stand Sit to Stand: Min assist         General transfer comment: Cues for hand placement to push from recliner into standing. Pt required min assistance to boost into standing and maintain  balance.  Ambulation/Gait Ambulation/Gait assistance: Min assist;Mod assist Gait Distance (Feet): 32 Feet Assistive device: Rolling walker (2 wheeled) Gait Pattern/deviations: Step-to pattern;Decreased stride length (hop to pattern.) Gait velocity: decr   General Gait Details: Pt able to increased gt distance but continues to present with poor safety to prevent LOB.  Pt with LOB to the right and posterior with min assistance to correct. As pt fatigues he presents with decreased foot clearance on the L side.   Stairs             Wheelchair Mobility    Modified Rankin (Stroke Patients Only)       Balance Overall balance assessment: Needs assistance Sitting-balance support: Bilateral upper extremity supported;Feet supported Sitting balance-Leahy Scale: Good     Standing balance support: Bilateral upper extremity supported;During functional activity Standing balance-Leahy Scale: Fair                              Cognition Arousal/Alertness: Awake/alert Behavior During Therapy: WFL for tasks assessed/performed Overall Cognitive Status: No family/caregiver present to determine baseline cognitive functioning                                 General Comments: Patient is pleasant, decreased awareness of limitations      Exercises Total Joint Exercises Long Arc Quad: AROM;Right;10 reps;Seated (decreased extension) Other Exercises Other Exercises: R LE knee extension  stretched, he is unable to extend R knee.  Attempted multiple manual strectches but still unable to apply limb guard.    General Comments        Pertinent Vitals/Pain Pain Assessment: No/denies pain    Home Living                      Prior Function            PT Goals (current goals can now be found in the care plan section) Acute Rehab PT Goals Patient Stated Goal: patient agreeable to go to rehab Potential to Achieve Goals: Good Progress towards PT goals:  Progressing toward goals    Frequency    Min 3X/week      PT Plan Current plan remains appropriate    Co-evaluation              AM-PAC PT "6 Clicks" Mobility   Outcome Measure  Help needed turning from your back to your side while in a flat bed without using bedrails?: None Help needed moving from lying on your back to sitting on the side of a flat bed without using bedrails?: A Little Help needed moving to and from a bed to a chair (including a wheelchair)?: A Little Help needed standing up from a chair using your arms (e.g., wheelchair or bedside chair)?: A Little Help needed to walk in hospital room?: A Lot Help needed climbing 3-5 steps with a railing? : A Lot 6 Click Score: 17    End of Session Equipment Utilized During Treatment: Gait belt Activity Tolerance: Patient tolerated treatment well Patient left: in chair;with call bell/phone within reach;with chair alarm set Nurse Communication: Mobility status (R LE knee contracture.) PT Visit Diagnosis: Unsteadiness on feet (R26.81);Other abnormalities of gait and mobility (R26.89);Muscle weakness (generalized) (M62.81);Difficulty in walking, not elsewhere classified (R26.2)     Time: 6962-9528 PT Time Calculation (min) (ACUTE ONLY): 23 min  Charges:  $Gait Training: 8-22 mins $Therapeutic Exercise: 8-22 mins                     Erasmo Leventhal , PTA Acute Rehabilitation Services Pager [phone] Office [phone]     Hristopher Missildine Eli Hose 05/19/2020, 3:15 PM

## 2020-05-19 NOTE — Plan of Care (Signed)

## 2020-05-20 ENCOUNTER — Ambulatory Visit: Payer: Medicare Other | Admitting: *Deleted

## 2020-05-20 DIAGNOSIS — I10 Essential (primary) hypertension: Secondary | ICD-10-CM

## 2020-05-20 DIAGNOSIS — E43 Unspecified severe protein-calorie malnutrition: Secondary | ICD-10-CM | POA: Diagnosis not present

## 2020-05-20 DIAGNOSIS — E785 Hyperlipidemia, unspecified: Secondary | ICD-10-CM | POA: Diagnosis not present

## 2020-05-20 DIAGNOSIS — I96 Gangrene, not elsewhere classified: Secondary | ICD-10-CM | POA: Diagnosis not present

## 2020-05-20 DIAGNOSIS — E1169 Type 2 diabetes mellitus with other specified complication: Secondary | ICD-10-CM | POA: Diagnosis not present

## 2020-05-20 DIAGNOSIS — Z4781 Encounter for orthopedic aftercare following surgical amputation: Secondary | ICD-10-CM | POA: Diagnosis not present

## 2020-05-20 DIAGNOSIS — Z743 Need for continuous supervision: Secondary | ICD-10-CM | POA: Diagnosis not present

## 2020-05-20 DIAGNOSIS — N400 Enlarged prostate without lower urinary tract symptoms: Secondary | ICD-10-CM

## 2020-05-20 DIAGNOSIS — E119 Type 2 diabetes mellitus without complications: Secondary | ICD-10-CM | POA: Diagnosis not present

## 2020-05-20 DIAGNOSIS — D509 Iron deficiency anemia, unspecified: Secondary | ICD-10-CM | POA: Diagnosis not present

## 2020-05-20 DIAGNOSIS — I714 Abdominal aortic aneurysm, without rupture: Secondary | ICD-10-CM | POA: Diagnosis not present

## 2020-05-20 DIAGNOSIS — Z7401 Bed confinement status: Secondary | ICD-10-CM | POA: Diagnosis not present

## 2020-05-20 DIAGNOSIS — L89159 Pressure ulcer of sacral region, unspecified stage: Secondary | ICD-10-CM | POA: Diagnosis not present

## 2020-05-20 DIAGNOSIS — I739 Peripheral vascular disease, unspecified: Secondary | ICD-10-CM

## 2020-05-20 DIAGNOSIS — E1165 Type 2 diabetes mellitus with hyperglycemia: Secondary | ICD-10-CM | POA: Diagnosis not present

## 2020-05-20 DIAGNOSIS — J449 Chronic obstructive pulmonary disease, unspecified: Secondary | ICD-10-CM | POA: Diagnosis not present

## 2020-05-20 DIAGNOSIS — R5381 Other malaise: Secondary | ICD-10-CM | POA: Diagnosis not present

## 2020-05-20 DIAGNOSIS — K219 Gastro-esophageal reflux disease without esophagitis: Secondary | ICD-10-CM | POA: Diagnosis not present

## 2020-05-20 DIAGNOSIS — L89310 Pressure ulcer of right buttock, unstageable: Secondary | ICD-10-CM | POA: Diagnosis not present

## 2020-05-20 DIAGNOSIS — S7221XD Displaced subtrochanteric fracture of right femur, subsequent encounter for closed fracture with routine healing: Secondary | ICD-10-CM | POA: Diagnosis not present

## 2020-05-20 DIAGNOSIS — Z89511 Acquired absence of right leg below knee: Secondary | ICD-10-CM | POA: Diagnosis not present

## 2020-05-20 DIAGNOSIS — K2950 Unspecified chronic gastritis without bleeding: Secondary | ICD-10-CM | POA: Diagnosis not present

## 2020-05-20 DIAGNOSIS — D649 Anemia, unspecified: Secondary | ICD-10-CM | POA: Diagnosis not present

## 2020-05-20 DIAGNOSIS — E1140 Type 2 diabetes mellitus with diabetic neuropathy, unspecified: Secondary | ICD-10-CM

## 2020-05-20 DIAGNOSIS — L89153 Pressure ulcer of sacral region, stage 3: Secondary | ICD-10-CM | POA: Diagnosis not present

## 2020-05-20 DIAGNOSIS — I350 Nonrheumatic aortic (valve) stenosis: Secondary | ICD-10-CM | POA: Diagnosis not present

## 2020-05-20 DIAGNOSIS — R404 Transient alteration of awareness: Secondary | ICD-10-CM | POA: Diagnosis not present

## 2020-05-20 DIAGNOSIS — M2550 Pain in unspecified joint: Secondary | ICD-10-CM | POA: Diagnosis not present

## 2020-05-20 DIAGNOSIS — S88111A Complete traumatic amputation at level between knee and ankle, right lower leg, initial encounter: Secondary | ICD-10-CM | POA: Diagnosis not present

## 2020-05-20 DIAGNOSIS — E1159 Type 2 diabetes mellitus with other circulatory complications: Secondary | ICD-10-CM

## 2020-05-20 LAB — GLUCOSE, CAPILLARY
Glucose-Capillary: 154 mg/dL — ABNORMAL HIGH (ref 70–99)
Glucose-Capillary: 201 mg/dL — ABNORMAL HIGH (ref 70–99)

## 2020-05-20 LAB — SURGICAL PATHOLOGY

## 2020-05-20 MED ORDER — OXYCODONE HCL 10 MG PO TABS: 10.0000 mg | ORAL_TABLET | ORAL | 0 refills | Status: AC | PRN

## 2020-05-20 NOTE — Discharge Summary (Signed)
Physician Discharge Summary  BENEDETTO RYDER YDX:[id number] DOB: 1951/04/02 DOA: 05/15/2020  PCP: [name], DO  Admit date: 05/15/2020 Discharge date: 05/20/2020  Admitted From: Home Disposition:  SNF  Recommendations for Outpatient Follow-up:  1. Follow up with PCP in 1-2 weeks 2. Please obtain BMP/CBC in one week   Home Health:No Equipment/Devices:None  Discharge Condition:Stable CODE STATUS:Full Diet recommendation: Heart Healthy  Brief/Interim Summary: 69 y.o. male past medical history significant for COPD, essential hypertension diabetes mellitus type 2 COPD hospitalized in April for a GI bleed secondary to chronic gastritis due to NSAID use.  During that admission the patient was AGAINST MEDICAL ADVICE  Discharge Diagnoses:  Active Problems:   Diabetic neuropathy, type II diabetes mellitus ([place])   Hyperlipidemia associated with type 2 diabetes mellitus ([place])   Hypertension associated with diabetes ([place])   Diabetes mellitus type 2 controlled   BPH (benign prostatic hyperplasia)   AAA (abdominal aortic aneurysm) without rupture (H[facility])   Critical lower limb ischemia   Anemia   Gangrene of right foot (HCC)   PAD (peripheral artery disease) (HCC)   Severe protein-calorie malnutrition (H[facility])   Pressure injury of skin  Critical limb ischemia with right foot gangrene: Order empirically on IV vancomycin and per plan blood cultures obtained and have remained negative till date. Patient was evaluated by vascular surgery and recommended a BKA performed on 05/18/2020. Wound VAC removed on 05/20/2020. Physical therapy evaluated the patient recommended skilled nursing facility.  Microcytic anemia: Endoscopy this year showed chronic gastritis, colonoscopy in 05/06/2020 was aborted due to poor bowel prep He was transfused with a unit of packed red blood cells on 05/16/2020. Will need to follow-up with GI as an outpatient to complete colonoscopy.  AAA: We will need to  follow-up with vascular surgery as an outpatient.  Uncontrolled diabetes mellitus: Continue current regimen.  No changes made to his medication  Essential hypertension: No changes made to his medication.  COPD: Appears to be stable.  Discharge Instructions  Discharge Instructions    Diet - low sodium heart healthy   Complete by: As directed    Increase activity slowly   Complete by: As directed    No wound care   Complete by: As directed      Allergies as of 05/20/2020      Reactions   Penicillins Other (See Comments)   Pt. States he "passed out"      Medication List    STOP taking these medications   cephALEXin 500 MG capsule Commonly known as: KEFLEX   mupirocin cream 2 % Commonly known as: BACTROBAN   Santyl ointment Generic drug: collagenase     TAKE these medications   amLODipine 10 MG tablet Commonly known as: NORVASC Take 1 tablet (10 mg total) by mouth daily.   aspirin EC 81 MG tablet Take 1 tablet (81 mg total) by mouth daily with breakfast.   BIOTIN 5000 PO Take 5,000 mcg by mouth daily.   cholecalciferol 1000 units tablet Commonly known as: VITAMIN D Take 1,000 Units by mouth daily.   escitalopram 10 MG tablet Commonly known as: LEXAPRO Take 1 tablet (10 mg total) by mouth daily.   ferrous sulfate 325 (65 FE) MG tablet Take 1 tablet (325 mg total) by mouth daily with breakfast.   hydrochlorothiazide 25 MG tablet Commonly known as: HYDRODIURIL Take 1 tablet (25 mg total) by mouth daily.   Jardiance 25 MG Tabs tablet Generic drug: empagliflozin Take 25 mg by mouth daily before  breakfast.   lisinopril 40 MG tablet Commonly known as: ZESTRIL TAKE ONE (1) TABLET EACH DAY What changed:   how much to take  how to take this  when to take this  additional instructions   metFORMIN 1000 MG tablet Commonly known as: GLUCOPHAGE Take 1 tablet (1,000 mg total) by mouth 2 (two) times daily with a meal.   metoprolol tartrate 25 MG  tablet Commonly known as: LOPRESSOR Take 1 tablet (25 mg total) by mouth 2 (two) times daily.   montelukast 10 MG tablet Commonly known as: SINGULAIR TAKE ONE TABLET DAILY AT BEDTIME   Oxycodone HCl 10 MG Tabs Take 1-1.5 tablets (10-15 mg total) by mouth every 4 (four) hours as needed for up to 5 days for severe pain (pain score 7-10).   pantoprazole 40 MG tablet Commonly known as: PROTONIX Take 1 tablet (40 mg total) by mouth daily.   pravastatin 40 MG tablet Commonly known as: PRAVACHOL Take 1 tablet (40 mg total) by mouth every evening.   [name] 100 MG tablet Commonly known as: Januvia Take 1 tablet (100 mg total) by mouth daily.       Follow-up Information    Newt Minion, MD In 1 week.   Specialty: Orthopedic Surgery Contact information: [address] [place] [address] [phone]              Allergies  Allergen Reactions  . Penicillins Other (See Comments)    Pt. States he "passed out"    Consultations:  Vascular surgery   Procedures/Studies: DG Chest 2 View  Result Date: 05/15/2020 CLINICAL DATA:  RIGHT foot wound, injury in January, gangrenous foot, history diabetes mellitus, hypertension, COPD EXAM: CHEST - 2 VIEW COMPARISON:  09/21/2017 FINDINGS: Normal heart size, mediastinal contours, and pulmonary vascularity. Atherosclerotic calcification aorta. Lungs clear. No pulmonary infiltrate, pleural effusion, or pneumothorax. Osseous structures unremarkable. IMPRESSION: No acute abnormalities. Electronically Signed   By: Lavonia Dana M.D.   On: 05/15/2020 20:06   DG Tibia/Fibula Right  Result Date: 05/15/2020 CLINICAL DATA:  Gangrenous foot EXAM: RIGHT TIBIA AND FIBULA - 2 VIEW COMPARISON:  None. FINDINGS: No acute displaced fracture or malalignment. No periostitis or frank bone destruction. Diffuse soft tissue swelling. IMPRESSION: No acute osseous abnormality. Diffuse soft tissue swelling. Electronically Signed   By: Donavan Foil M.D.    On: 05/15/2020 20:06   DG Foot Complete Right  Result Date: 05/15/2020 CLINICAL DATA:  69 year old male with right foot necrosis. EXAM: RIGHT FOOT COMPLETE - 3+ VIEW COMPARISON:  Radiograph dated 02/13/2020. FINDINGS: There is no acute fracture or dislocation. The bones are osteopenic. No periosteal elevation or bone erosion. There is diffuse soft tissue air predominantly involving the forefoot consistent with necrotizing fasciitis. Clinical correlation is recommended. A skin wound noted along the dorsal aspect of the calcaneus. IMPRESSION: 1. No acute fracture or dislocation. 2. Diffuse soft tissue air predominantly involving the forefoot consistent with gangrenous changes. Electronically Signed   By: Anner Crete M.D.   On: 05/15/2020 18:10   VAS US CAROTID  Result Date: 05/19/2020 Carotid Arterial Duplex Study Indications:       Bilateral bruits. Risk Factors:      Hypertension, hyperlipidemia, Diabetes, current smoker, PAD. Comparison Study:  Prior study 03-06-20 showed bilateral ICA stenosis. Performing Technologist: Darlin Coco  Examination Guidelines: A complete evaluation includes B-mode imaging, spectral Doppler, color Doppler, and power Doppler as needed of all accessible portions of each vessel. Bilateral testing is considered an integral part of  a complete examination. Limited examinations for reoccurring indications may be performed as noted.  Right Carotid Findings: +----------+--------+--------+--------+------------------+---------------------+           PSV cm/sEDV cm/sStenosisPlaque DescriptionComments              +----------+--------+--------+--------+------------------+---------------------+ CCA Prox  64      12              irregular and                                                             heterogenous                            +----------+--------+--------+--------+------------------+---------------------+ CCA Distal103     13              irregular  and                                                             heterogenous                            +----------+--------+--------+--------+------------------+---------------------+ ICA Prox  121     22              irregular and                                                             calcific                                +----------+--------+--------+--------+------------------+---------------------+ ICA Mid   248     43      60-79%  irregular and     Velocities may be                                       calcific          underestimated due to                                                     calcific plaque       +----------+--------+--------+--------+------------------+---------------------+ ICA Distal112     13                                                      +----------+--------+--------+--------+------------------+---------------------+ ECA       135                                                             +----------+--------+--------+--------+------------------+---------------------+ +----------+--------+-------+----------------+-------------------+  PSV cm/sEDV cmsDescribe        Arm Pressure (mmHG) +----------+--------+-------+----------------+-------------------+ [id number]            Multiphasic, WNL                    +----------+--------+-------+----------------+-------------------+ +---------+--------+--+--------+--+---------+ VertebralPSV cm/s83EDV cm/s16Antegrade +---------+--------+--+--------+--+---------+  Left Carotid Findings: +----------+--------+--------+--------+------------------+---------------------+           PSV cm/sEDV cm/sStenosisPlaque DescriptionComments              +----------+--------+--------+--------+------------------+---------------------+ CCA Prox  105     17              irregular and                                                              heterogenous                            +----------+--------+--------+--------+------------------+---------------------+ CCA Mid                           irregular and                                                             heterogenous                            +----------+--------+--------+--------+------------------+---------------------+ CCA Distal111     15              irregular and                                                             heterogenous                            +----------+--------+--------+--------+------------------+---------------------+ ICA Prox  110     20              irregular and                                                             calcific                                +----------+--------+--------+--------+------------------+---------------------+ ICA Mid   288     71      60-79%  irregular and     Velocities may be  calcific          underestimated due to                                                     calcific plaque       +----------+--------+--------+--------+------------------+---------------------+ ICA Distal100     22                                                      +----------+--------+--------+--------+------------------+---------------------+ ECA       115                                                             +----------+--------+--------+--------+------------------+---------------------+ +----------+--------+--------+----------------+-------------------+           PSV cm/sEDV cm/sDescribe        Arm Pressure (mmHG) +----------+--------+--------+----------------+-------------------+ [id number]             Multiphasic, WNL                    +----------+--------+--------+----------------+-------------------+ +---------+--------+--+--------+--+---------+ VertebralPSV cm/s40EDV cm/s10Antegrade  +---------+--------+--+--------+--+---------+   Summary: Right Carotid: 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 Non-hemodynamically significant plaque <50% noted in                the CCA. Left Carotid: Velocities in the left ICA are consistent with a 60-79% stenosis.               Non-hemodynamically significant plaque <50% noted in the CCA. Vertebrals:  Bilateral vertebral arteries demonstrate antegrade flow. Subclavians: Normal flow hemodynamics were seen in bilateral subclavian              arteries. *See table(s) above for measurements and observations.  Electronically signed by Deitra Mayo MD on 05/19/2020 at 8:54:24 AM.    Final       Subjective: No complains  Discharge Exam: Vitals:   05/19/20 2007 05/20/20 0624  BP: (!) 121/52 (!) 137/47  Pulse: 80 68  Resp: 17 16  Temp: 99.2 F (37.3 C) 98.1 F (36.7 C)  SpO2: 98% 98%   Vitals:   05/19/20 0435 05/19/20 1431 05/19/20 2007 05/20/20 0624  BP: (!) 155/35 (!) 116/56 (!) 121/52 (!) 137/47  Pulse: 75 79 80 68  Resp: 18 17 17 16   Temp: 98.2 F (36.8 C) 98.1 F (36.7 C) 99.2 F (37.3 C) 98.1 F (36.7 C)  TempSrc: Oral Oral Oral Oral  SpO2: 91% 97% 98% 98%  Weight:      Height:        General: Pt is alert, awake, not in acute distress Cardiovascular: RRR, S1/S2 +, no rubs, no gallops Respiratory: CTA bilaterally, no wheezing, no rhonchi Abdominal: Soft, NT, ND, bowel sounds + Extremities: no edema, no cyanosis    The results of significant diagnostics from this hospitalization (including imaging, microbiology, ancillary and laboratory) are listed below for reference.  Microbiology: Recent Results (from the past 240 hour(s))  SARS Coronavirus 2 by RT PCR (hospital order, performed in [facility] hospital lab) Nasopharyngeal Nasopharyngeal Swab     Status: None   Collection Time: 05/15/20  7:20 PM   Specimen: Nasopharyngeal Swab  Result Value Ref Range Status   SARS  Coronavirus 2 NEGATIVE NEGATIVE Final    Comment: (NOTE) SARS-CoV-2 target nucleic acids are NOT DETECTED.  The SARS-CoV-2 RNA is generally detectable in upper and lower respiratory specimens during the acute phase of infection. The lowest concentration of SARS-CoV-2 viral copies this assay can detect is 250 copies / mL. A negative result does not preclude SARS-CoV-2 infection and should not be used as the sole basis for treatment or other patient management decisions.  A negative result may occur with improper specimen collection / handling, submission of specimen other than nasopharyngeal swab, presence of viral mutation(s) within the areas targeted by this assay, and inadequate number of viral copies (<250 copies / mL). A negative result must be combined with clinical observations, patient history, and epidemiological information.  Fact Sheet for Patients:   StrictlyIdeas.no  Fact Sheet for Healthcare Providers: BankingDealers.co.za  This test is not yet approved or  cleared by the [place] FDA and has been authorized for detection and/or diagnosis of SARS-CoV-2 by FDA under an Emergency Use Authorization (EUA).  This EUA will remain in effect (meaning this test can be used) for the duration of the COVID-19 declaration under Section 564(b)(1) of the Act, 21 U.S.C. section 360bbb-3(b)(1), unless the authorization is terminated or revoked sooner.  Performed at [facility] [address] [place], [place] [address]   Culture, blood (routine x 2)     Status: None (Preliminary result)   Collection Time: 05/15/20  9:33 PM   Specimen: BLOOD RIGHT FOREARM  Result Value Ref Range Status   Specimen Description BLOOD RIGHT FOREARM  Final   Special Requests   Final    BOTTLES DRAWN AEROBIC AND ANAEROBIC Blood Culture results may not be optimal due to an inadequate volume of blood received in culture bottles   Culture   Final    NO  GROWTH 4 DAYS Performed at [facility], [address] [place], [place] [address]    Report Status PENDING  Incomplete  Culture, blood (routine x 2)     Status: None (Preliminary result)   Collection Time: 05/15/20  9:37 PM   Specimen: BLOOD LEFT HAND  Result Value Ref Range Status   Specimen Description BLOOD LEFT HAND  Final   Special Requests   Final    BOTTLES DRAWN AEROBIC AND ANAEROBIC Blood Culture results may not be optimal due to an inadequate volume of blood received in culture bottles   Culture   Final    NO GROWTH 4 DAYS Performed at [facility], [address], [place], [place] [address]    Report Status PENDING  Incomplete  MRSA PCR Screening     Status: None   Collection Time: 05/16/20  5:03 PM   Specimen: Nasal Mucosa; Nasopharyngeal  Result Value Ref Range Status   MRSA by PCR NEGATIVE NEGATIVE Final    Comment:        The GeneXpert MRSA Assay (FDA approved for NASAL specimens only), is one component of a comprehensive MRSA colonization surveillance program. It is not intended to diagnose MRSA infection nor to guide or monitor treatment for MRSA infections. Performed at [facility], [place] [address], [place], [place] [address]  SARS CORONAVIRUS 2 (TAT 6-24 HRS) Nasopharyngeal Nasopharyngeal Swab     Status: None   Collection Time: 05/19/20  1:59 PM   Specimen: Nasopharyngeal Swab  Result Value Ref Range Status   SARS Coronavirus 2 NEGATIVE NEGATIVE Final    Comment: (NOTE) SARS-CoV-2 target nucleic acids are NOT DETECTED.  The SARS-CoV-2 RNA is generally detectable in upper and lower respiratory specimens during the acute phase of infection. Negative results do not preclude SARS-CoV-2 infection, do not rule out co-infections with other pathogens, and should not be used as the sole basis for treatment or other patient management decisions. Negative results must be combined with clinical observations, patient history, and epidemiological  information. The expected result is Negative.  Fact Sheet for Patients: SugarRoll.be  Fact Sheet for Healthcare Providers: https://www.woods-mathews.com/  This test is not yet approved or cleared by the [place] FDA and  has been authorized for detection and/or diagnosis of SARS-CoV-2 by FDA under an Emergency Use Authorization (EUA). This EUA will remain  in effect (meaning this test can be used) for the duration of the COVID-19 declaration under Se ction 564(b)(1) of the Act, 21 U.S.C. section 360bbb-3(b)(1), unless the authorization is terminated or revoked sooner.  Performed at [facility], [place] [address] [place], [place] [address]      Labs: BNP (last 3 results) No results for input(s): BNP in the last 8760 hours. Basic Metabolic Panel: Recent Labs  Lab 05/15/20 1920 05/16/20 0110 05/17/20 0344 05/19/20 0225  NA 136 136 132*  --   K 3.4* 3.4* 3.8  --   CL 99 98 97*  --   CO2 28 27 26   --   GLUCOSE 141* 181* 152*  --   BUN 14 8 10   --   CREATININE 0.71 0.76 0.74  --   CALCIUM 8.6* 8.5* 8.3*  --   MG  --   --  1.6* 1.6*  PHOS  --   --  2.9  --    Liver Function Tests: Recent Labs  Lab 05/15/20 1920 05/17/20 0344  AST 20  --   ALT 22  --   ALKPHOS 134*  --   BILITOT 0.3  --   PROT 6.6  --   ALBUMIN 2.2* 1.8*   No results for input(s): LIPASE, AMYLASE in the last 168 hours. No results for input(s): AMMONIA in the last 168 hours. CBC: Recent Labs  Lab 05/15/20 1920 05/16/20 0110 05/17/20 0344 05/18/20 0241  WBC 12.5* 12.0* 11.1* 12.6*  NEUTROABS 10.3*  --  9.2* 11.2*  HGB 8.3* 7.7* 7.9* 7.8*  HCT 28.8* 27.1* 27.6* 27.7*  MCV 78.5* 77.7* 77.1* 77.2*  PLT 596* 544* 548* 556*   Cardiac Enzymes: No results for input(s): CKTOTAL, CKMB, CKMBINDEX, TROPONINI in the last 168 hours. BNP: Invalid input(s): POCBNP CBG: Recent Labs  Lab 05/19/20 0737 05/19/20 1209 05/19/20 1650 05/19/20 2035  05/20/20 0744  GLUCAP 182* 157* 235* 324* 154*   D-Dimer No results for input(s): DDIMER in the last 72 hours. Hgb A1c No results for input(s): HGBA1C in the last 72 hours. Lipid Profile No results for input(s): CHOL, HDL, LDLCALC, TRIG, CHOLHDL, LDLDIRECT in the last 72 hours. Thyroid function studies No results for input(s): TSH, T4TOTAL, T3FREE, THYROIDAB in the last 72 hours.  Invalid input(s): FREET3 Anemia work up No results for input(s): VITAMINB12, FOLATE, FERRITIN, TIBC, IRON, RETICCTPCT in the last 72 hours. Urinalysis    Component Value Date/Time   APPEARANCEUR Clear 08/08/2018  1119   GLUCOSEU Negative 08/08/2018 1119   BILIRUBINUR Negative 08/08/2018 1119   PROTEINUR Negative 08/08/2018 1119   UROBILINOGEN negative 10/31/2014 1002   NITRITE Negative 08/08/2018 1119   LEUKOCYTESUR Negative 08/08/2018 1119   Sepsis Labs Invalid input(s): PROCALCITONIN,  WBC,  LACTICIDVEN Microbiology Recent Results (from the past 240 hour(s))  SARS Coronavirus 2 by RT PCR (hospital order, performed in [facility] hospital lab) Nasopharyngeal Nasopharyngeal Swab     Status: None   Collection Time: 05/15/20  7:20 PM   Specimen: Nasopharyngeal Swab  Result Value Ref Range Status   SARS Coronavirus 2 NEGATIVE NEGATIVE Final    Comment: (NOTE) SARS-CoV-2 target nucleic acids are NOT DETECTED.  The SARS-CoV-2 RNA is generally detectable in upper and lower respiratory specimens during the acute phase of infection. The lowest concentration of SARS-CoV-2 viral copies this assay can detect is 250 copies / mL. A negative result does not preclude SARS-CoV-2 infection and should not be used as the sole basis for treatment or other patient management decisions.  A negative result may occur with improper specimen collection / handling, submission of specimen other than nasopharyngeal swab, presence of viral mutation(s) within the areas targeted by this assay, and inadequate number of viral  copies (<250 copies / mL). A negative result must be combined with clinical observations, patient history, and epidemiological information.  Fact Sheet for Patients:   StrictlyIdeas.no  Fact Sheet for Healthcare Providers: BankingDealers.co.za  This test is not yet approved or  cleared by the [place] FDA and has been authorized for detection and/or diagnosis of SARS-CoV-2 by FDA under an Emergency Use Authorization (EUA).  This EUA will remain in effect (meaning this test can be used) for the duration of the COVID-19 declaration under Section 564(b)(1) of the Act, 21 U.S.C. section 360bbb-3(b)(1), unless the authorization is terminated or revoked sooner.  Performed at [facility], [address], [place], [place] [address]   Culture, blood (routine x 2)     Status: None (Preliminary result)   Collection Time: 05/15/20  9:33 PM   Specimen: BLOOD RIGHT FOREARM  Result Value Ref Range Status   Specimen Description BLOOD RIGHT FOREARM  Final   Special Requests   Final    BOTTLES DRAWN AEROBIC AND ANAEROBIC Blood Culture results may not be optimal due to an inadequate volume of blood received in culture bottles   Culture   Final    NO GROWTH 4 DAYS Performed at [facility], [address] [place], [place] [address]    Report Status PENDING  Incomplete  Culture, blood (routine x 2)     Status: None (Preliminary result)   Collection Time: 05/15/20  9:37 PM   Specimen: BLOOD LEFT HAND  Result Value Ref Range Status   Specimen Description BLOOD LEFT HAND  Final   Special Requests   Final    BOTTLES DRAWN AEROBIC AND ANAEROBIC Blood Culture results may not be optimal due to an inadequate volume of blood received in culture bottles   Culture   Final    NO GROWTH 4 DAYS Performed at [facility], [address], [place], [place] [address]    Report Status PENDING  Incomplete  MRSA PCR Screening     Status: None   Collection Time:  05/16/20  5:03 PM   Specimen: Nasal Mucosa; Nasopharyngeal  Result Value Ref Range Status   MRSA by PCR NEGATIVE NEGATIVE Final    Comment:        The GeneXpert  MRSA Assay (FDA approved for NASAL specimens only), is one component of a comprehensive MRSA colonization surveillance program. It is not intended to diagnose MRSA infection nor to guide or monitor treatment for MRSA infections. Performed at [facility], [place] [address], [place], [place] [address]   SARS CORONAVIRUS 2 (TAT 6-24 HRS) Nasopharyngeal Nasopharyngeal Swab     Status: None   Collection Time: 05/19/20  1:59 PM   Specimen: Nasopharyngeal Swab  Result Value Ref Range Status   SARS Coronavirus 2 NEGATIVE NEGATIVE Final    Comment: (NOTE) SARS-CoV-2 target nucleic acids are NOT DETECTED.  The SARS-CoV-2 RNA is generally detectable in upper and lower respiratory specimens during the acute phase of infection. Negative results do not preclude SARS-CoV-2 infection, do not rule out co-infections with other pathogens, and should not be used as the sole basis for treatment or other patient management decisions. Negative results must be combined with clinical observations, patient history, and epidemiological information. The expected result is Negative.  Fact Sheet for Patients: SugarRoll.be  Fact Sheet for Healthcare Providers: https://www.woods-mathews.com/  This test is not yet approved or cleared by the [place] FDA and  has been authorized for detection and/or diagnosis of SARS-CoV-2 by FDA under an Emergency Use Authorization (EUA). This EUA will remain  in effect (meaning this test can be used) for the duration of the COVID-19 declaration under Se ction 564(b)(1) of the Act, 21 U.S.C. section 360bbb-3(b)(1), unless the authorization is terminated or revoked sooner.  Performed at [facility], [place] [address], [place], [place] [address]      Time  coordinating discharge: Over 30 minutes  SIGNED:   Charlynne Cousins, MD  [facility] 05/20/2020, 11:21 AM Pager   If 7PM-7AM, please contact night-coverage [email or web address] Password [email or web address]

## 2020-05-20 NOTE — Chronic Care Management (AMB) (Signed)
°  Care Management   Care Coordination Note  05/20/2020 Name: Omar Todd MRN: [id number] DOB: 1951/09/20  I was consulted by Theadore Nan, LCSW regarding a referral for [name] that was placed to the CCM team in 02/2020. Advised that I had spoken with [name] at that time and he refused CCM services. He had refused them previously as well. Education on disease management was provided to the patient and his sister and his sisters were assisting him with appointment follow-ups and in-home care.   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 I expect outpatient rehab or SNF placement at discharge.    Follow up plan: CCM team will outreach to patient after discharge to discuss enrollment in CCM program and care coordination.   Chong Sicilian, BSN, RN-BC Embedded Chronic Care Manager [facility] [place] Management Direct Dial: [phone]

## 2020-05-20 NOTE — Progress Notes (Signed)
Patient is status post right below-knee amputation.  0 cc in the canister.  Wound VAC was removed.  Patient has good healing with well approximated wound edges minimal bloody drainage.  Patient does have difficulty straightening his leg.  Stump shrinker was applied.  Will order a smaller shrinker.

## 2020-05-20 NOTE — Social Work (Addendum)
Clinical Social Worker facilitated patient discharge including contacting patient family and facility to confirm patient discharge plans.  Clinical information faxed to facility and family agreeable with plan.  CSW arranged ambulance transport via [facility] to [facility] room [place] to call [phone]  with report prior to discharge.  Clinical Social Worker will sign off for now as social work intervention is no longer needed. Please consult [place] again if new need arises.  Westley Hummer, MSW, LCSW Clinical Social Worker

## 2020-05-20 NOTE — Plan of Care (Signed)
  Problem: Education: Goal: Knowledge of General Education information will improve Description: Including pain rating scale, medication(s)/side effects and non-pharmacologic comfort measures 05/20/2020 1208 by Everlean Patterson, RN Outcome: Adequate for Discharge 05/20/2020 1208 by Everlean Patterson, RN Outcome: Adequate for Discharge   Problem: Health Behavior/Discharge Planning: Goal: Ability to manage health-related needs will improve 05/20/2020 1208 by Everlean Patterson, RN Outcome: Adequate for Discharge 05/20/2020 1208 by Everlean Patterson, RN Outcome: Adequate for Discharge   Problem: Clinical Measurements: Goal: Ability to maintain clinical measurements within normal limits will improve 05/20/2020 1208 by Everlean Patterson, RN Outcome: Adequate for Discharge 05/20/2020 1208 by Everlean Patterson, RN Outcome: Adequate for Discharge Goal: Will remain free from infection 05/20/2020 1208 by Everlean Patterson, RN Outcome: Adequate for Discharge 05/20/2020 1208 by Everlean Patterson, RN Outcome: Adequate for Discharge Goal: Diagnostic test results will improve 05/20/2020 1208 by Everlean Patterson, RN Outcome: Adequate for Discharge 05/20/2020 1208 by Everlean Patterson, RN Outcome: Adequate for Discharge Goal: Respiratory complications will improve 05/20/2020 1208 by Everlean Patterson, RN Outcome: Adequate for Discharge 05/20/2020 1208 by Everlean Patterson, RN Outcome: Adequate for Discharge Goal: Cardiovascular complication will be avoided 05/20/2020 1208 by Everlean Patterson, RN Outcome: Adequate for Discharge 05/20/2020 1208 by Everlean Patterson, RN Outcome: Adequate for Discharge   Problem: Activity: Goal: Risk for activity intolerance will decrease 05/20/2020 1208 by Everlean Patterson, RN Outcome: Adequate for Discharge 05/20/2020 1208 by Everlean Patterson, RN Outcome: Adequate for Discharge   Problem: Nutrition: Goal: Adequate nutrition will be maintained 05/20/2020 1208 by Everlean Patterson, RN Outcome: Adequate for  Discharge 05/20/2020 1208 by Everlean Patterson, RN Outcome: Adequate for Discharge   Problem: Coping: Goal: Level of anxiety will decrease 05/20/2020 1208 by Everlean Patterson, RN Outcome: Adequate for Discharge 05/20/2020 1208 by Everlean Patterson, RN Outcome: Adequate for Discharge   Problem: Elimination: Goal: Will not experience complications related to bowel motility 05/20/2020 1208 by Everlean Patterson, RN Outcome: Adequate for Discharge 05/20/2020 1208 by Everlean Patterson, RN Outcome: Adequate for Discharge Goal: Will not experience complications related to urinary retention 05/20/2020 1208 by Everlean Patterson, RN Outcome: Adequate for Discharge 05/20/2020 1208 by Everlean Patterson, RN Outcome: Adequate for Discharge   Problem: Pain Managment: Goal: General experience of comfort will improve 05/20/2020 1208 by Everlean Patterson, RN Outcome: Adequate for Discharge 05/20/2020 1208 by Everlean Patterson, RN Outcome: Adequate for Discharge   Problem: Safety: Goal: Ability to remain free from injury will improve 05/20/2020 1208 by Everlean Patterson, RN Outcome: Adequate for Discharge 05/20/2020 1208 by Everlean Patterson, RN Outcome: Adequate for Discharge   Problem: Skin Integrity: Goal: Risk for impaired skin integrity will decrease 05/20/2020 1208 by Everlean Patterson, RN Outcome: Adequate for Discharge 05/20/2020 1208 by Everlean Patterson, RN Outcome: Adequate for Discharge

## 2020-05-20 NOTE — TOC Transition Note (Addendum)
Transition of Care [facility]) - CM/SW Discharge Note   Patient Details  Name: Omar Todd MRN: [id number] Date of Birth: [birth year]  [facility] CM/SW Contact:  Alexander Mt, LCSW Phone Number: 05/20/2020, 8:35 AM   Clinical Narrative:  11:47am- CSW has sent all paperwork for pt, arranged [facility] for 1pm pick up. RN Broadus John aware. Pt sister [name] and pt aware. No scripts noted, PT[facility] papers on chart.     8:35am- Authorization received for [facility] CB#[id number] Ref #[id number] 6/29-7/1.   Dr. Aileen Fass contacted with this information. Will need summary and scripts if pt medically stable for d/c.   Final next level of care: Skilled Nursing Facility Barriers to Discharge: Barriers Resolved   Patient Goals and CMS Choice Patient states their goals for this hospitalization and ongoing recovery are:: learn how to get around without my foot CMS Medicare.gov Compare Post Acute Care list provided to:: Patient Represenative (must comment) (pt sister [name]) Choice offered to / list presented to : Sibling, Patient  Discharge Placement PASRR number recieved: 05/18/20            Patient chooses bed at: [facility] Patient to be transferred to facility by: [place] Name of family member notified: pt sister [name] via telephone Patient and family notified of of transfer: 05/20/20  Discharge Plan and Services In-house Referral: Clinical Social Work Discharge Planning Services: CM Consult Post Acute Care Choice: [place]         Readmission Risk Interventions Readmission Risk Prevention Plan 05/18/2020  Transportation Screening Complete  PCP or Specialist Appt within 3-5 Days Not Complete  Not Complete comments plan for SNF  HRI or Home Care Consult Complete  Social Work Consult for [place] Planning/Counseling Complete  Palliative Care Screening Not Complete  Palliative Care Screening Not Complete Comments could be appropriate  Medication  Review Press photographer) Referral to Pharmacy  Some recent data might be hidden

## 2020-05-20 NOTE — Progress Notes (Signed)
Attempted several times to call report to [facility]. Left phone message w call back number on nurse managers voicemail.

## 2020-05-21 LAB — CULTURE, BLOOD (ROUTINE X 2)
Culture: NO GROWTH
Culture: NO GROWTH

## 2020-05-23 NOTE — Progress Notes (Signed)
  Subjective:  [name], male    DOB: 11-Aug-1951,  MRN: [id number]  Chief Complaint  Patient presents with  . Wound Check    Pt states having severe cramping in legs and that his toes are in more pain. Denies fever/chills/nausea/vomiting.   69 y.o. male presents for wound care. Hx confirmed with patient.  Objective:  Physical Exam: Wound Location: Right hallux Wound Measurement: 1.5x1 Wound Base: Fibrotic slough Peri-wound: Normal Exudate: None: wound tissue dry wound without warmth, erythema, signs of acute infection  Wound Location: 4th toe sulcus Wound Measurement: 0.5x1 Wound Base: fibrotic Peri-wound: Normal Exudate: None: wound tissue dry wound without warmth, erythema, signs of acute infection  Wound Location: R heel  Wound Measurement: 1x1 Wound Base: Granular/Healthy Peri-wound: Calloused Exudate: None: wound tissue dry wound without warmth, erythema, signs of acute infection  Assessment:   1. Ulcer of great toe, right, with fat layer exposed ([place])   2. Ulcer of heel, right, with fat layer exposed ([place])   3. Ulcer of right midfoot limited to breakdown of skin ([place])   4. PAD (peripheral artery disease) ([place])   5. Tobacco abuse     Plan:  Patient was evaluated and treated and all questions answered.  Ulcer of Right Great Toe, Right Heel, Right 4th toe Sulcus -Dressing applied consisting of silvadene and band-aid -Offload ulcer with surgical shoe -Wound cleansed and debrided at posterior heel -No signs of acute infection noted   PAD -Discussed worsening vascular issues. -Has appointment today for arterial studies.   Follow-up in 1 week for recheck

## 2020-05-24 NOTE — Telephone Encounter (Signed)
Erroneous

## 2020-05-26 DIAGNOSIS — S88111A Complete traumatic amputation at level between knee and ankle, right lower leg, initial encounter: Secondary | ICD-10-CM | POA: Diagnosis not present

## 2020-05-26 DIAGNOSIS — I10 Essential (primary) hypertension: Secondary | ICD-10-CM | POA: Diagnosis not present

## 2020-05-26 DIAGNOSIS — D649 Anemia, unspecified: Secondary | ICD-10-CM | POA: Diagnosis not present

## 2020-05-26 DIAGNOSIS — E119 Type 2 diabetes mellitus without complications: Secondary | ICD-10-CM | POA: Diagnosis not present

## 2020-05-26 DIAGNOSIS — I739 Peripheral vascular disease, unspecified: Secondary | ICD-10-CM | POA: Diagnosis not present

## 2020-05-27 ENCOUNTER — Ambulatory Visit (INDEPENDENT_AMBULATORY_CARE_PROVIDER_SITE_OTHER): Payer: Medicare Other | Admitting: Physician Assistant

## 2020-05-27 ENCOUNTER — Encounter: Payer: Self-pay | Admitting: Physician Assistant

## 2020-05-27 ENCOUNTER — Other Ambulatory Visit: Payer: Self-pay

## 2020-05-27 VITALS — Ht 74.0 in | Wt 176.2 lb

## 2020-05-27 DIAGNOSIS — Z89511 Acquired absence of right leg below knee: Secondary | ICD-10-CM

## 2020-05-27 DIAGNOSIS — S88119A Complete traumatic amputation at level between knee and ankle, unspecified lower leg, initial encounter: Secondary | ICD-10-CM

## 2020-05-27 NOTE — Progress Notes (Signed)
Office Visit Note   Patient: Omar Todd           Date of Birth: 07/12/1951           MRN: [id number] Visit Date: 05/27/2020              Requested by: [name], DO [place],  [place] [address] PCP: [name], DO  Chief Complaint  Patient presents with  . Right Leg - Routine Post Op    05/17/20 Right BKA      HPI: Patient is 10 days status post right below-knee amputation.  He is currently residing in a nursing facility.  He does not have any specific complaints.  He says that physical therapy has been working on getting his knee straight  Assessment & Plan: Visit Diagnoses: No diagnosis found.  Plan: I recommended that if he is not already taking muscle relaxer and of appropriate a muscle relaxer should be added to his regime.  Focus with physical therapy on getting full extension of his knee.  Should change his shrinker daily.  May wash the stump with Dial soap and water  Follow-Up Instructions: No follow-ups on file.   Ortho Exam  Patient is alert, oriented, no adenopathy, well-dressed, normal affect, normal respiratory effort. Right lower extremity amputation stump well approximated wound edges.  He does have some bloody drainage but overall well controlled.  Swelling overall well controlled he has some areas of skin superficial breakdown on his anterior tibia.  These were debrided to healthy skin beneath.  No cellulitis when trying to get his knee straight he does have a lot of spasms in his stump.  There does not seem to be any significant issues with flexion  Imaging: No results found. No images are attached to the encounter.  Labs: Lab Results  Component Value Date   HGBA1C 8.3 (H) 05/15/2020   HGBA1C 7.9 (H) 03/13/2020   HGBA1C 8.7 (H) 12/09/2019   ESRSEDRATE 54 (H) 03/13/2020   CRP 5.8 (H) 03/13/2020   REPTSTATUS 05/21/2020 FINAL 05/15/2020   CULT  05/15/2020    NO GROWTH 6 DAYS Performed at [facility], [address] [place], [place] [address]      Lab Results  Component Value Date   ALBUMIN 1.8 (L) 05/17/2020   ALBUMIN 2.2 (L) 05/15/2020   ALBUMIN 3.8 02/05/2020    Lab Results  Component Value Date   MG 1.6 (L) 05/19/2020   MG 1.6 (L) 05/17/2020   Lab Results  Component Value Date   VD25OH 49.1 08/08/2018   VD25OH 49.0 04/04/2018   VD25OH 54.6 09/25/2017    No results found for: PREALBUMIN CBC EXTENDED Latest Ref Rng & Units 05/18/2020 05/17/2020 05/16/2020  WBC 4.0 - 10.5 K/uL 12.6(H) 11.1(H) 12.0(H)  RBC 4.22 - 5.81 MIL/uL 3.59(L) 3.58(L) 3.49(L)  HGB 13.0 - 17.0 g/dL 7.8(L) 7.9(L) 7.7(L)  HCT 39 - 52 % 27.7(L) 27.6(L) 27.1(L)  PLT 150 - 400 K/uL 556(H) 548(H) 544(H)  NEUTROABS 1.7 - 7.7 K/uL 11.2(H) 9.2(H) -  LYMPHSABS 0.7 - 4.0 K/uL 0.7 1.1 -     Body mass index is 22.62 kg/m.  Orders:  No orders of the defined types were placed in this encounter.  No orders of the defined types were placed in this encounter.    Procedures: No procedures performed  Clinical Data: No additional findings.  ROS:  All other systems negative, except as noted in the HPI. Review of Systems  Objective: Vital Signs: Ht 6\' 2"  (1.88 m)   Wt 176 lb 2.4 oz (79.9 kg)   BMI 22.62 kg/m   Specialty Comments:  No specialty comments available.  PMFS History: Patient Active Problem List   Diagnosis Date Noted  . Pressure injury of skin 05/16/2020  . Severe protein-calorie malnutrition ([place])   . Gangrene of right foot ([place]) 03/26/2020  . PAD (peripheral artery disease) ([place]) 03/26/2020  . Anemia 03/12/2020  . GI bleed 03/12/2020  . AAA (abdominal aortic aneurysm) without rupture ([place]) 02/28/2020  . Critical lower limb ischemia 02/28/2020  . BPH (benign prostatic hyperplasia) 10/31/2014  . Systolic ejection murmur 27/78/2423  . Bilateral carotid bruits 10/31/2014  . Erectile dysfunction 11/05/2013  . Bilateral carotid artery disease ([place]) 04/11/2013  . Hyperlipidemia associated with  type 2 diabetes mellitus ([place]) 04/11/2013  . Hypertension associated with diabetes ([place]) 04/11/2013  . COPD exacerbation ([place]) 04/11/2013  . Diabetes mellitus type 2 controlled 04/11/2013  . Diabetic neuropathy, type II diabetes mellitus ([place]) 03/15/2013   Past Medical History:  Diagnosis Date  . COPD (chronic obstructive pulmonary disease) ([place])   . Diabetes mellitus without complication ([place])   . Gangrene of toe of right foot ([place])   . GERD (gastroesophageal reflux disease)   . Hyperlipidemia   . Hypertension     Family History  Problem Relation Age of Onset  . Cancer Father   . Diabetes Sister   . Stroke Brother   . Healthy Sister   . Healthy Sister   . Colon cancer Neg Hx   . Gastric cancer Neg Hx     Past Surgical History:  Procedure Laterality Date  . AMPUTATION Right 05/17/2020   Procedure: AMPUTATION BELOW RIGHT KNEE;  Surgeon: Newt Minion, MD;  Location: [facility];  Service: Orthopedics;  Laterality: Right;  . APPLICATION OF WOUND VAC Right 05/17/2020   Procedure: APPLICATION OF WOUND VAC;  Surgeon: Newt Minion, MD;  Location: [facility];  Service: Orthopedics;  Laterality: Right;  . BIOPSY  03/13/2020   Procedure: BIOPSY;  Surgeon: Daneil Dolin, MD;  [facility];  Service: Endoscopy;;  gastric biopsy  . ESOPHAGOGASTRODUODENOSCOPY N/A 03/13/2020   Procedure: ESOPHAGOGASTRODUODENOSCOPY (EGD);  Surgeon: Daneil Dolin, MD;  [facility];  Service: Endoscopy;  Laterality: N/A;  . FLEXIBLE SIGMOIDOSCOPY N/A 05/06/2020   Procedure: FLEXIBLE SIGMOIDOSCOPY;  Surgeon: Daneil Dolin, MD;  [facility];  Service: Endoscopy;  Laterality: N/A;  . Thumb surgery     Social History   Occupational History  . Occupation: Retired    Comment: Science writer  Tobacco Use  . Smoking status: Current Every Day Smoker    Packs/day: 0.50    Years: 60.00    Pack years: 30.00    Types: Cigarettes  . Smokeless tobacco: Never Used  Vaping Use  .  Vaping Use: Former  Substance and Sexual Activity  . Alcohol use: No    Alcohol/week: 0.0 standard drinks  . Drug use: No  . Sexual activity: Yes    Birth control/protection: None

## 2020-06-01 DIAGNOSIS — L89159 Pressure ulcer of sacral region, unspecified stage: Secondary | ICD-10-CM | POA: Diagnosis not present

## 2020-06-01 DIAGNOSIS — I739 Peripheral vascular disease, unspecified: Secondary | ICD-10-CM | POA: Diagnosis not present

## 2020-06-01 DIAGNOSIS — E119 Type 2 diabetes mellitus without complications: Secondary | ICD-10-CM | POA: Diagnosis not present

## 2020-06-01 DIAGNOSIS — I10 Essential (primary) hypertension: Secondary | ICD-10-CM | POA: Diagnosis not present

## 2020-06-02 ENCOUNTER — Telehealth: Payer: Self-pay | Admitting: Family Medicine

## 2020-06-02 NOTE — Telephone Encounter (Signed)
Appointment scheduled for 07/27 @ 3:00 with Lajuana Ripple.

## 2020-06-03 ENCOUNTER — Ambulatory Visit (INDEPENDENT_AMBULATORY_CARE_PROVIDER_SITE_OTHER): Payer: Medicare Other | Admitting: Physician Assistant

## 2020-06-03 ENCOUNTER — Encounter: Payer: Self-pay | Admitting: Physician Assistant

## 2020-06-03 ENCOUNTER — Other Ambulatory Visit: Payer: Self-pay

## 2020-06-03 VITALS — Ht 74.0 in | Wt 176.2 lb

## 2020-06-03 DIAGNOSIS — Z89511 Acquired absence of right leg below knee: Secondary | ICD-10-CM

## 2020-06-03 DIAGNOSIS — E1169 Type 2 diabetes mellitus with other specified complication: Secondary | ICD-10-CM | POA: Diagnosis not present

## 2020-06-03 DIAGNOSIS — E1165 Type 2 diabetes mellitus with hyperglycemia: Secondary | ICD-10-CM | POA: Diagnosis not present

## 2020-06-03 DIAGNOSIS — Z4781 Encounter for orthopedic aftercare following surgical amputation: Secondary | ICD-10-CM | POA: Diagnosis not present

## 2020-06-03 DIAGNOSIS — E1140 Type 2 diabetes mellitus with diabetic neuropathy, unspecified: Secondary | ICD-10-CM | POA: Diagnosis not present

## 2020-06-03 DIAGNOSIS — D509 Iron deficiency anemia, unspecified: Secondary | ICD-10-CM | POA: Diagnosis not present

## 2020-06-03 DIAGNOSIS — S88119A Complete traumatic amputation at level between knee and ankle, unspecified lower leg, initial encounter: Secondary | ICD-10-CM

## 2020-06-03 NOTE — Progress Notes (Signed)
Office Visit Note   Patient: Omar Todd           Date of Birth: Oct 24, 1951           MRN: [id number] Visit Date: 06/03/2020              Requested by: [name], DO [place],  [place] [address] PCP: [name], DO  Chief Complaint  Patient presents with  . Right Leg - Routine Post Op    05/17/20 Right BKA      HPI: This is a pleasant gentleman who is 3 weeks status post below-knee amputation.  He just got discharged home from a nursing facility.  He is using his shrinkers.  He has no particular complaints.  Assessment & Plan: Visit Diagnoses: No diagnosis found.  Plan: Patient does have home physical therapy.  I stressed the importance of trying to achieve full knee extension.  Patient will follow up in 1 week.  I also gave him prescriptions for 1 size smaller shrinker and emphasized the importance of elevating his limb  Follow-Up Instructions: No follow-ups on file.   Ortho Exam  Patient is alert, oriented, no adenopathy, well-dressed, normal affect, normal respiratory effort. Focused examination of his right lower extremity demonstrates moderate soft tissue swelling in the amputation stump.  Overall the wound edges are well approximated with some serous drainage.  No necrosis or wound dehiscence.  No foul odor no evidence of infection.  He is 15 degrees short of full extension with his knee.  Per his report this does improve when he is exercising it  Imaging: No results found. No images are attached to the encounter.  Labs: Lab Results  Component Value Date   HGBA1C 8.3 (H) 05/15/2020   HGBA1C 7.9 (H) 03/13/2020   HGBA1C 8.7 (H) 12/09/2019   ESRSEDRATE 54 (H) 03/13/2020   CRP 5.8 (H) 03/13/2020   REPTSTATUS 05/21/2020 FINAL 05/15/2020   CULT  05/15/2020    NO GROWTH 6 DAYS Performed at [facility], [address], [place], [place] [address]      Lab Results  Component Value Date   ALBUMIN 1.8 (L) 05/17/2020   ALBUMIN 2.2 (L)  05/15/2020   ALBUMIN 3.8 02/05/2020    Lab Results  Component Value Date   MG 1.6 (L) 05/19/2020   MG 1.6 (L) 05/17/2020   Lab Results  Component Value Date   VD25OH 49.1 08/08/2018   VD25OH 49.0 04/04/2018   VD25OH 54.6 09/25/2017    No results found for: PREALBUMIN CBC EXTENDED Latest Ref Rng & Units 05/18/2020 05/17/2020 05/16/2020  WBC 4.0 - 10.5 K/uL 12.6(H) 11.1(H) 12.0(H)  RBC 4.22 - 5.81 MIL/uL 3.59(L) 3.58(L) 3.49(L)  HGB 13.0 - 17.0 g/dL 7.8(L) 7.9(L) 7.7(L)  HCT 39 - 52 % 27.7(L) 27.6(L) 27.1(L)  PLT 150 - 400 K/uL 556(H) 548(H) 544(H)  NEUTROABS 1.7 - 7.7 K/uL 11.2(H) 9.2(H) -  LYMPHSABS 0.7 - 4.0 K/uL 0.7 1.1 -     Body mass index is 22.62 kg/m.  Orders:  No orders of the defined types were placed in this encounter.  No orders of the defined types were placed in this encounter.    Procedures: No procedures performed  Clinical Data: No additional findings.  ROS:  All other systems negative, except as noted in the HPI. Review of Systems  Objective: Vital Signs: Ht 6\' 2"  (1.88 m)   Wt 176 lb 2.4 oz (79.9 kg)   BMI 22.62 kg/m  Specialty Comments:  No specialty comments available.  PMFS History: Patient Active Problem List   Diagnosis Date Noted  . Pressure injury of skin 05/16/2020  . Severe protein-calorie malnutrition ([place])   . Gangrene of right foot ([place]) 03/26/2020  . PAD (peripheral artery disease) ([place]) 03/26/2020  . Anemia 03/12/2020  . GI bleed 03/12/2020  . AAA (abdominal aortic aneurysm) without rupture ([place]) 02/28/2020  . Critical lower limb ischemia 02/28/2020  . BPH (benign prostatic hyperplasia) 10/31/2014  . Systolic ejection murmur 23/53/6144  . Bilateral carotid bruits 10/31/2014  . Erectile dysfunction 11/05/2013  . Bilateral carotid artery disease ([place]) 04/11/2013  . Hyperlipidemia associated with type 2 diabetes mellitus ([place]) 04/11/2013  . Hypertension associated with diabetes ([place]) 04/11/2013  . COPD exacerbation  ([place]) 04/11/2013  . Diabetes mellitus type 2 controlled 04/11/2013  . Diabetic neuropathy, type II diabetes mellitus ([place]) 03/15/2013   Past Medical History:  Diagnosis Date  . COPD (chronic obstructive pulmonary disease) ([place])   . Diabetes mellitus without complication ([place])   . Gangrene of toe of right foot ([place])   . GERD (gastroesophageal reflux disease)   . Hyperlipidemia   . Hypertension     Family History  Problem Relation Age of Onset  . Cancer Father   . Diabetes Sister   . Stroke Brother   . Healthy Sister   . Healthy Sister   . Colon cancer Neg Hx   . Gastric cancer Neg Hx     Past Surgical History:  Procedure Laterality Date  . AMPUTATION Right 05/17/2020   Procedure: AMPUTATION BELOW RIGHT KNEE;  Surgeon: Newt Minion, MD;  Location: [facility];  Service: Orthopedics;  Laterality: Right;  . APPLICATION OF WOUND VAC Right 05/17/2020   Procedure: APPLICATION OF WOUND VAC;  Surgeon: Newt Minion, MD;  Location: [facility];  Service: Orthopedics;  Laterality: Right;  . BIOPSY  03/13/2020   Procedure: BIOPSY;  Surgeon: Daneil Dolin, MD;  [facility];  Service: Endoscopy;;  gastric biopsy  . ESOPHAGOGASTRODUODENOSCOPY N/A 03/13/2020   Procedure: ESOPHAGOGASTRODUODENOSCOPY (EGD);  Surgeon: Daneil Dolin, MD;  [facility];  Service: Endoscopy;  Laterality: N/A;  . FLEXIBLE SIGMOIDOSCOPY N/A 05/06/2020   Procedure: FLEXIBLE SIGMOIDOSCOPY;  Surgeon: Daneil Dolin, MD;  [facility];  Service: Endoscopy;  Laterality: N/A;  . Thumb surgery     Social History   Occupational History  . Occupation: Retired    Comment: Science writer  Tobacco Use  . Smoking status: Current Every Day Smoker    Packs/day: 0.50    Years: 60.00    Pack years: 30.00    Types: Cigarettes  . Smokeless tobacco: Never Used  Vaping Use  . Vaping Use: Former  Substance and Sexual Activity  . Alcohol use: No    Alcohol/week: 0.0 standard drinks  . Drug use:  No  . Sexual activity: Yes    Birth control/protection: None

## 2020-06-05 DIAGNOSIS — Z4781 Encounter for orthopedic aftercare following surgical amputation: Secondary | ICD-10-CM | POA: Diagnosis not present

## 2020-06-05 DIAGNOSIS — J449 Chronic obstructive pulmonary disease, unspecified: Secondary | ICD-10-CM | POA: Diagnosis not present

## 2020-06-05 DIAGNOSIS — I2510 Atherosclerotic heart disease of native coronary artery without angina pectoris: Secondary | ICD-10-CM | POA: Diagnosis not present

## 2020-06-05 DIAGNOSIS — D509 Iron deficiency anemia, unspecified: Secondary | ICD-10-CM | POA: Diagnosis not present

## 2020-06-05 DIAGNOSIS — I119 Hypertensive heart disease without heart failure: Secondary | ICD-10-CM | POA: Diagnosis not present

## 2020-06-05 DIAGNOSIS — E1165 Type 2 diabetes mellitus with hyperglycemia: Secondary | ICD-10-CM | POA: Diagnosis not present

## 2020-06-05 DIAGNOSIS — E1140 Type 2 diabetes mellitus with diabetic neuropathy, unspecified: Secondary | ICD-10-CM | POA: Diagnosis not present

## 2020-06-05 DIAGNOSIS — D519 Vitamin B12 deficiency anemia, unspecified: Secondary | ICD-10-CM | POA: Diagnosis not present

## 2020-06-05 DIAGNOSIS — K2950 Unspecified chronic gastritis without bleeding: Secondary | ICD-10-CM | POA: Diagnosis not present

## 2020-06-05 DIAGNOSIS — E1151 Type 2 diabetes mellitus with diabetic peripheral angiopathy without gangrene: Secondary | ICD-10-CM | POA: Diagnosis not present

## 2020-06-05 DIAGNOSIS — E43 Unspecified severe protein-calorie malnutrition: Secondary | ICD-10-CM | POA: Diagnosis not present

## 2020-06-05 DIAGNOSIS — L8990 Pressure ulcer of unspecified site, unspecified stage: Secondary | ICD-10-CM | POA: Diagnosis not present

## 2020-06-05 DIAGNOSIS — I70229 Atherosclerosis of native arteries of extremities with rest pain, unspecified extremity: Secondary | ICD-10-CM | POA: Diagnosis not present

## 2020-06-08 DIAGNOSIS — E1165 Type 2 diabetes mellitus with hyperglycemia: Secondary | ICD-10-CM | POA: Diagnosis not present

## 2020-06-08 DIAGNOSIS — J449 Chronic obstructive pulmonary disease, unspecified: Secondary | ICD-10-CM | POA: Diagnosis not present

## 2020-06-08 DIAGNOSIS — Z4781 Encounter for orthopedic aftercare following surgical amputation: Secondary | ICD-10-CM | POA: Diagnosis not present

## 2020-06-08 DIAGNOSIS — L8990 Pressure ulcer of unspecified site, unspecified stage: Secondary | ICD-10-CM | POA: Diagnosis not present

## 2020-06-08 DIAGNOSIS — D509 Iron deficiency anemia, unspecified: Secondary | ICD-10-CM | POA: Diagnosis not present

## 2020-06-08 DIAGNOSIS — E1140 Type 2 diabetes mellitus with diabetic neuropathy, unspecified: Secondary | ICD-10-CM | POA: Diagnosis not present

## 2020-06-08 DIAGNOSIS — I119 Hypertensive heart disease without heart failure: Secondary | ICD-10-CM | POA: Diagnosis not present

## 2020-06-08 DIAGNOSIS — I2510 Atherosclerotic heart disease of native coronary artery without angina pectoris: Secondary | ICD-10-CM | POA: Diagnosis not present

## 2020-06-08 DIAGNOSIS — K2950 Unspecified chronic gastritis without bleeding: Secondary | ICD-10-CM | POA: Diagnosis not present

## 2020-06-08 DIAGNOSIS — E43 Unspecified severe protein-calorie malnutrition: Secondary | ICD-10-CM | POA: Diagnosis not present

## 2020-06-08 DIAGNOSIS — E1151 Type 2 diabetes mellitus with diabetic peripheral angiopathy without gangrene: Secondary | ICD-10-CM | POA: Diagnosis not present

## 2020-06-08 DIAGNOSIS — D519 Vitamin B12 deficiency anemia, unspecified: Secondary | ICD-10-CM | POA: Diagnosis not present

## 2020-06-08 DIAGNOSIS — I70229 Atherosclerosis of native arteries of extremities with rest pain, unspecified extremity: Secondary | ICD-10-CM | POA: Diagnosis not present

## 2020-06-10 DIAGNOSIS — D519 Vitamin B12 deficiency anemia, unspecified: Secondary | ICD-10-CM | POA: Diagnosis not present

## 2020-06-10 DIAGNOSIS — K2950 Unspecified chronic gastritis without bleeding: Secondary | ICD-10-CM | POA: Diagnosis not present

## 2020-06-10 DIAGNOSIS — I2510 Atherosclerotic heart disease of native coronary artery without angina pectoris: Secondary | ICD-10-CM | POA: Diagnosis not present

## 2020-06-10 DIAGNOSIS — L8990 Pressure ulcer of unspecified site, unspecified stage: Secondary | ICD-10-CM | POA: Diagnosis not present

## 2020-06-10 DIAGNOSIS — E1140 Type 2 diabetes mellitus with diabetic neuropathy, unspecified: Secondary | ICD-10-CM | POA: Diagnosis not present

## 2020-06-10 DIAGNOSIS — D509 Iron deficiency anemia, unspecified: Secondary | ICD-10-CM | POA: Diagnosis not present

## 2020-06-10 DIAGNOSIS — E1165 Type 2 diabetes mellitus with hyperglycemia: Secondary | ICD-10-CM | POA: Diagnosis not present

## 2020-06-10 DIAGNOSIS — J449 Chronic obstructive pulmonary disease, unspecified: Secondary | ICD-10-CM | POA: Diagnosis not present

## 2020-06-10 DIAGNOSIS — E43 Unspecified severe protein-calorie malnutrition: Secondary | ICD-10-CM | POA: Diagnosis not present

## 2020-06-10 DIAGNOSIS — I119 Hypertensive heart disease without heart failure: Secondary | ICD-10-CM | POA: Diagnosis not present

## 2020-06-10 DIAGNOSIS — Z4781 Encounter for orthopedic aftercare following surgical amputation: Secondary | ICD-10-CM | POA: Diagnosis not present

## 2020-06-10 DIAGNOSIS — E1151 Type 2 diabetes mellitus with diabetic peripheral angiopathy without gangrene: Secondary | ICD-10-CM | POA: Diagnosis not present

## 2020-06-10 DIAGNOSIS — I70229 Atherosclerosis of native arteries of extremities with rest pain, unspecified extremity: Secondary | ICD-10-CM | POA: Diagnosis not present

## 2020-06-11 ENCOUNTER — Encounter: Payer: Self-pay | Admitting: Physician Assistant

## 2020-06-11 ENCOUNTER — Ambulatory Visit (INDEPENDENT_AMBULATORY_CARE_PROVIDER_SITE_OTHER): Payer: Medicare Other | Admitting: Physician Assistant

## 2020-06-11 ENCOUNTER — Other Ambulatory Visit: Payer: Self-pay

## 2020-06-11 VITALS — Ht 74.0 in | Wt 176.2 lb

## 2020-06-11 DIAGNOSIS — L8990 Pressure ulcer of unspecified site, unspecified stage: Secondary | ICD-10-CM | POA: Diagnosis not present

## 2020-06-11 DIAGNOSIS — Z89512 Acquired absence of left leg below knee: Secondary | ICD-10-CM

## 2020-06-11 DIAGNOSIS — I119 Hypertensive heart disease without heart failure: Secondary | ICD-10-CM | POA: Diagnosis not present

## 2020-06-11 DIAGNOSIS — Z4781 Encounter for orthopedic aftercare following surgical amputation: Secondary | ICD-10-CM | POA: Diagnosis not present

## 2020-06-11 DIAGNOSIS — I2510 Atherosclerotic heart disease of native coronary artery without angina pectoris: Secondary | ICD-10-CM | POA: Diagnosis not present

## 2020-06-11 DIAGNOSIS — I70229 Atherosclerosis of native arteries of extremities with rest pain, unspecified extremity: Secondary | ICD-10-CM | POA: Diagnosis not present

## 2020-06-11 DIAGNOSIS — Z7689 Persons encountering health services in other specified circumstances: Secondary | ICD-10-CM | POA: Diagnosis not present

## 2020-06-11 DIAGNOSIS — J449 Chronic obstructive pulmonary disease, unspecified: Secondary | ICD-10-CM | POA: Diagnosis not present

## 2020-06-11 DIAGNOSIS — E1165 Type 2 diabetes mellitus with hyperglycemia: Secondary | ICD-10-CM | POA: Diagnosis not present

## 2020-06-11 DIAGNOSIS — E1151 Type 2 diabetes mellitus with diabetic peripheral angiopathy without gangrene: Secondary | ICD-10-CM | POA: Diagnosis not present

## 2020-06-11 DIAGNOSIS — D509 Iron deficiency anemia, unspecified: Secondary | ICD-10-CM | POA: Diagnosis not present

## 2020-06-11 DIAGNOSIS — K2950 Unspecified chronic gastritis without bleeding: Secondary | ICD-10-CM | POA: Diagnosis not present

## 2020-06-11 DIAGNOSIS — D519 Vitamin B12 deficiency anemia, unspecified: Secondary | ICD-10-CM | POA: Diagnosis not present

## 2020-06-11 DIAGNOSIS — S88119A Complete traumatic amputation at level between knee and ankle, unspecified lower leg, initial encounter: Secondary | ICD-10-CM

## 2020-06-11 DIAGNOSIS — E1140 Type 2 diabetes mellitus with diabetic neuropathy, unspecified: Secondary | ICD-10-CM | POA: Diagnosis not present

## 2020-06-11 DIAGNOSIS — E43 Unspecified severe protein-calorie malnutrition: Secondary | ICD-10-CM | POA: Diagnosis not present

## 2020-06-11 MED ORDER — DOXYCYCLINE HYCLATE 100 MG PO TABS: 100.0000 mg | ORAL_TABLET | Freq: Two times a day (BID) | ORAL | 0 refills | Status: DC

## 2020-06-11 NOTE — Progress Notes (Signed)
Office Visit Note   Patient: Omar Todd           Date of Birth: 1951-02-08           MRN: [id number] Visit Date: 06/11/2020              Requested by: [name], DO [place],  [place] [id number] PCP: [name], DO  Chief Complaint  Patient presents with  . 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  He was to get more shrinkers from [facility] but states he is having trouble getting touch with him.  He does have home physical therapy coming out to work on range of motion and wound care.  Assessment & Plan: Visit Diagnoses: No diagnosis found.  Plan: I emphasized the importance of elevating his leg to help with the swelling.  He also needs to obtain more shrinkers.  I have placed him on a week of oral antibiotics.  Continue to work with physical therapy  Follow-Up Instructions: No follow-ups on file.   Ortho Exam  Patient is alert, oriented, no adenopathy, well-dressed, normal affect, normal respiratory effort. Focused exam demonstrates some maceration of the wound.  There is no foul odor surgical staples are in place.  Mild erythema but nothing probes deeply.  He does have a contracture he can only extend his knee about 20 degrees.  He states previously with his native leg he was able to completely straighten his knee no evidence of cellulitis moderate soft tissue swelling  Imaging: No results found. No images are attached to the encounter.  Labs: Lab Results  Component Value Date   HGBA1C 8.3 (H) 05/15/2020   HGBA1C 7.9 (H) 03/13/2020   HGBA1C 8.7 (H) 12/09/2019   ESRSEDRATE 54 (H) 03/13/2020   CRP 5.8 (H) 03/13/2020   REPTSTATUS 05/21/2020 FINAL 05/15/2020   CULT  05/15/2020    NO GROWTH 6 DAYS Performed at [facility], [address], [place], [place] [address]      Lab Results  Component Value Date   ALBUMIN 1.8 (L)  05/17/2020   ALBUMIN 2.2 (L) 05/15/2020   ALBUMIN 3.8 02/05/2020    Lab Results  Component Value Date   MG 1.6 (L) 05/19/2020   MG 1.6 (L) 05/17/2020   Lab Results  Component Value Date   VD25OH 49.1 08/08/2018   VD25OH 49.0 04/04/2018   VD25OH 54.6 09/25/2017    No results found for: PREALBUMIN CBC EXTENDED Latest Ref Rng & Units 05/18/2020 05/17/2020 05/16/2020  WBC 4.0 - 10.5 K/uL 12.6(H) 11.1(H) 12.0(H)  RBC 4.22 - 5.81 MIL/uL 3.59(L) 3.58(L) 3.49(L)  HGB 13.0 - 17.0 g/dL 7.8(L) 7.9(L) 7.7(L)  HCT 39 - 52 % 27.7(L) 27.6(L) 27.1(L)  PLT 150 - 400 K/uL 556(H) 548(H) 544(H)  NEUTROABS 1.7 - 7.7 K/uL 11.2(H) 9.2(H) -  LYMPHSABS 0.7 - 4.0 K/uL 0.7 1.1 -     Body mass index is 22.62 kg/m.  Orders:  No orders of the defined types were placed in this encounter.  Meds ordered this encounter  Medications  . doxycycline (VIBRA-TABS) 100 MG tablet    Sig: Take 1 tablet (100 mg total) by mouth 2 (two) times daily.    Dispense:  60 tablet    Refill:  0     Procedures: No procedures performed  Clinical  Data: No additional findings.  ROS:  All other systems negative, except as noted in the HPI. Review of Systems  Objective: Vital Signs: Ht 6\' 2"  (1.88 m)   Wt 176 lb 2.4 oz (79.9 kg)   BMI 22.62 kg/m   Specialty Comments:  No specialty comments available.  PMFS History: Patient Active Problem List   Diagnosis Date Noted  . Pressure injury of skin 05/16/2020  . Severe protein-calorie malnutrition ([place])   . Gangrene of right foot ([place]) 03/26/2020  . PAD (peripheral artery disease) ([place]) 03/26/2020  . Anemia 03/12/2020  . GI bleed 03/12/2020  . AAA (abdominal aortic aneurysm) without rupture ([place]) 02/28/2020  . Critical lower limb ischemia 02/28/2020  . BPH (benign prostatic hyperplasia) 10/31/2014  . Systolic ejection murmur 89/38/1017  . Bilateral carotid bruits 10/31/2014  . Erectile dysfunction 11/05/2013  . Bilateral carotid artery disease ([place])  04/11/2013  . Hyperlipidemia associated with type 2 diabetes mellitus ([place]) 04/11/2013  . Hypertension associated with diabetes ([place]) 04/11/2013  . COPD exacerbation ([place]) 04/11/2013  . Diabetes mellitus type 2 controlled 04/11/2013  . Diabetic neuropathy, type II diabetes mellitus ([place]) 03/15/2013   Past Medical History:  Diagnosis Date  . COPD (chronic obstructive pulmonary disease) ([place])   . Diabetes mellitus without complication ([place])   . Gangrene of toe of right foot ([place])   . GERD (gastroesophageal reflux disease)   . Hyperlipidemia   . Hypertension     Family History  Problem Relation Age of Onset  . Cancer Father   . Diabetes Sister   . Stroke Brother   . Healthy Sister   . Healthy Sister   . Colon cancer Neg Hx   . Gastric cancer Neg Hx     Past Surgical History:  Procedure Laterality Date  . AMPUTATION Right 05/17/2020   Procedure: AMPUTATION BELOW RIGHT KNEE;  Surgeon: Newt Minion, MD;  Location: [facility];  Service: Orthopedics;  Laterality: Right;  . APPLICATION OF WOUND VAC Right 05/17/2020   Procedure: APPLICATION OF WOUND VAC;  Surgeon: Newt Minion, MD;  Location: [facility];  Service: Orthopedics;  Laterality: Right;  . BIOPSY  03/13/2020   Procedure: BIOPSY;  Surgeon: Daneil Dolin, MD;  [facility];  Service: Endoscopy;;  gastric biopsy  . ESOPHAGOGASTRODUODENOSCOPY N/A 03/13/2020   Procedure: ESOPHAGOGASTRODUODENOSCOPY (EGD);  Surgeon: Daneil Dolin, MD;  [facility];  Service: Endoscopy;  Laterality: N/A;  . FLEXIBLE SIGMOIDOSCOPY N/A 05/06/2020   Procedure: FLEXIBLE SIGMOIDOSCOPY;  Surgeon: Daneil Dolin, MD;  [facility];  Service: Endoscopy;  Laterality: N/A;  . Thumb surgery     Social History   Occupational History  . Occupation: Retired    Comment: Science writer  Tobacco Use  . Smoking status: Current Every Day Smoker    Packs/day: 0.50    Years: 60.00    Pack years: 30.00    Types: Cigarettes  .  Smokeless tobacco: Never Used  Vaping Use  . Vaping Use: Former  Substance and Sexual Activity  . Alcohol use: No    Alcohol/week: 0.0 standard drinks  . Drug use: No  . Sexual activity: Yes    Birth control/protection: None

## 2020-06-14 ENCOUNTER — Encounter: Payer: Self-pay | Admitting: Family Medicine

## 2020-06-15 DIAGNOSIS — I2510 Atherosclerotic heart disease of native coronary artery without angina pectoris: Secondary | ICD-10-CM | POA: Diagnosis not present

## 2020-06-15 DIAGNOSIS — L8990 Pressure ulcer of unspecified site, unspecified stage: Secondary | ICD-10-CM | POA: Diagnosis not present

## 2020-06-15 DIAGNOSIS — I119 Hypertensive heart disease without heart failure: Secondary | ICD-10-CM | POA: Diagnosis not present

## 2020-06-15 DIAGNOSIS — Z4781 Encounter for orthopedic aftercare following surgical amputation: Secondary | ICD-10-CM | POA: Diagnosis not present

## 2020-06-15 DIAGNOSIS — E1140 Type 2 diabetes mellitus with diabetic neuropathy, unspecified: Secondary | ICD-10-CM | POA: Diagnosis not present

## 2020-06-15 DIAGNOSIS — E1165 Type 2 diabetes mellitus with hyperglycemia: Secondary | ICD-10-CM | POA: Diagnosis not present

## 2020-06-15 DIAGNOSIS — E43 Unspecified severe protein-calorie malnutrition: Secondary | ICD-10-CM | POA: Diagnosis not present

## 2020-06-15 DIAGNOSIS — D519 Vitamin B12 deficiency anemia, unspecified: Secondary | ICD-10-CM | POA: Diagnosis not present

## 2020-06-15 DIAGNOSIS — J449 Chronic obstructive pulmonary disease, unspecified: Secondary | ICD-10-CM | POA: Diagnosis not present

## 2020-06-15 DIAGNOSIS — E1151 Type 2 diabetes mellitus with diabetic peripheral angiopathy without gangrene: Secondary | ICD-10-CM | POA: Diagnosis not present

## 2020-06-15 DIAGNOSIS — K2950 Unspecified chronic gastritis without bleeding: Secondary | ICD-10-CM | POA: Diagnosis not present

## 2020-06-15 DIAGNOSIS — I70229 Atherosclerosis of native arteries of extremities with rest pain, unspecified extremity: Secondary | ICD-10-CM | POA: Diagnosis not present

## 2020-06-15 DIAGNOSIS — D509 Iron deficiency anemia, unspecified: Secondary | ICD-10-CM | POA: Diagnosis not present

## 2020-06-16 ENCOUNTER — Ambulatory Visit: Payer: Medicare Other | Admitting: *Deleted

## 2020-06-16 ENCOUNTER — Other Ambulatory Visit: Payer: Self-pay

## 2020-06-16 ENCOUNTER — Ambulatory Visit (INDEPENDENT_AMBULATORY_CARE_PROVIDER_SITE_OTHER): Payer: Medicare Other | Admitting: Family Medicine

## 2020-06-16 VITALS — BP 136/64 | HR 77 | Temp 97.3°F

## 2020-06-16 DIAGNOSIS — K922 Gastrointestinal hemorrhage, unspecified: Secondary | ICD-10-CM | POA: Diagnosis not present

## 2020-06-16 DIAGNOSIS — I739 Peripheral vascular disease, unspecified: Secondary | ICD-10-CM | POA: Diagnosis not present

## 2020-06-16 DIAGNOSIS — Z89511 Acquired absence of right leg below knee: Secondary | ICD-10-CM

## 2020-06-16 LAB — HEMOGLOBIN, FINGERSTICK: Hemoglobin: 7.1 g/dL — ABNORMAL LOW (ref 12.6–17.7)

## 2020-06-16 NOTE — Progress Notes (Signed)
Subjective: CC: nursing home follow up PCP: [name], DO Omar Todd is a 69 y.o. male presenting to clinic today for:  1.  Status post right BKA Patient status post right BKA.  He has follow-up with orthopedist soon.  He was discharged from the nursing home and is currently working with home health PT, OT at home.  He has his sister as well as a niece helping him around the home.  Overall he reports feeling well.  Pain is well controlled with current pain regimen.  He reports compliance with all other medications.  He is totally stop smoking.  2.  GI bleed Patient does not report any GI bleeding.  He was seen by gastroenterology.   ROS: Per HPI  Allergies  Allergen Reactions  . Penicillins Other (See Comments)    Pt. States he "passed out"   Past Medical History:  Diagnosis Date  . COPD (chronic obstructive pulmonary disease) ([place])   . Diabetes mellitus without complication ([place])   . Gangrene of toe of right foot ([place])   . GERD (gastroesophageal reflux disease)   . Hyperlipidemia   . Hypertension     Current Outpatient Medications:  .  amLODipine (NORVASC) 10 MG tablet, Take 1 tablet (10 mg total) by mouth daily., Disp: 30 tablet, Rfl: 3 .  aspirin EC 81 MG tablet, Take 1 tablet (81 mg total) by mouth daily with breakfast., Disp: 30 tablet, Rfl: 3 .  BIOTIN 5000 PO, Take 5,000 mcg by mouth daily. , Disp: , Rfl:  .  cholecalciferol (VITAMIN D) 1000 UNITS tablet, Take 1,000 Units by mouth daily., Disp: , Rfl:  .  doxycycline (VIBRA-TABS) 100 MG tablet, Take 1 tablet (100 mg total) by mouth 2 (two) times daily., Disp: 60 tablet, Rfl: 0 .  empagliflozin (JARDIANCE) 25 MG TABS tablet, Take 25 mg by mouth daily before breakfast., Disp: 30 tablet, Rfl: 3 .  escitalopram (LEXAPRO) 10 MG tablet, Take 1 tablet (10 mg total) by mouth daily., Disp: 30 tablet, Rfl: 3 .  ferrous sulfate 325 (65 FE) MG tablet, Take 1 tablet (325 mg total) by mouth daily with breakfast.,  Disp: 30 tablet, Rfl: 1 .  hydrochlorothiazide (HYDRODIURIL) 25 MG tablet, Take 1 tablet (25 mg total) by mouth daily., Disp: 30 tablet, Rfl: 3 .  lisinopril (ZESTRIL) 40 MG tablet, TAKE ONE (1) TABLET EACH DAY (Patient taking differently: Take 40 mg by mouth daily. ), Disp: 30 tablet, Rfl: 3 .  metFORMIN (GLUCOPHAGE) 1000 MG tablet, Take 1 tablet (1,000 mg total) by mouth 2 (two) times daily with a meal., Disp: 60 tablet, Rfl: 3 .  metoprolol tartrate (LOPRESSOR) 25 MG tablet, Take 1 tablet (25 mg total) by mouth 2 (two) times daily., Disp: 60 tablet, Rfl: 3 .  montelukast (SINGULAIR) 10 MG tablet, TAKE ONE TABLET DAILY AT BEDTIME (Patient taking differently: Take 10 mg by mouth at bedtime. ), Disp: 90 tablet, Rfl: 1 .  oxyCODONE (ROXICODONE) 15 MG immediate release tablet, Take by mouth., Disp: , Rfl:  .  pantoprazole (PROTONIX) 40 MG tablet, Take 1 tablet (40 mg total) by mouth daily., Disp: 30 tablet, Rfl: 2 .  pravastatin (PRAVACHOL) 40 MG tablet, Take 1 tablet (40 mg total) by mouth every evening., Disp: 30 tablet, Rfl: 5 .  [name] (JANUVIA) 100 MG tablet, Take 1 tablet (100 mg total) by mouth daily., Disp: 30 tablet, Rfl: 4 Social History   Socioeconomic History  . Marital status: Married  Spouse name: [name]  . Number of children: 3  . Years of education: 9th Grade  . Highest education level: 9th grade  Occupational History  . Occupation: Retired    Comment: Science writer  Tobacco Use  . Smoking status: Current Every Day Smoker    Packs/day: 0.50    Years: 60.00    Pack years: 30.00    Types: Cigarettes  . Smokeless tobacco: Never Used  Vaping Use  . Vaping Use: Former  Substance and Sexual Activity  . Alcohol use: No    Alcohol/week: 0.0 standard drinks  . Drug use: No  . Sexual activity: Yes    Birth control/protection: None  Other Topics Concern  . Not on file  Social History Narrative  . Not on file   Social Determinants of Health   Financial  Resource Strain: Low Risk   . Difficulty of Paying Living Expenses: Not very hard  Food Insecurity: No Food Insecurity  . Worried About Charity fundraiser in the Last Year: Never true  . Ran Out of Food in the Last Year: Never true  Transportation Needs: No Transportation Needs  . Lack of Transportation (Medical): No  . Lack of Transportation (Non-Medical): No  Physical Activity: Inactive  . Days of Exercise per Week: 0 days  . Minutes of Exercise per Session: 0 min  Stress: No Stress Concern Present  . Feeling of Stress : Only a little  Social Connections: Socially Integrated  . Frequency of Communication with Friends and Family: More than three times a week  . Frequency of Social Gatherings with Friends and Family: More than three times a week  . Attends Religious Services: More than 4 times per year  . Active Member of Clubs or Organizations: Yes  . Attends Archivist Meetings: More than 4 times per year  . Marital Status: Married  Human resources officer Violence: Not At Risk  . Fear of Current or Ex-Partner: No  . Emotionally Abused: No  . Physically Abused: No  . Sexually Abused: No   Family History  Problem Relation Age of Onset  . Cancer Father   . Diabetes Sister   . Stroke Brother   . Healthy Sister   . Healthy Sister   . Colon cancer Neg Hx   . Gastric cancer Neg Hx     Objective: Office vital signs reviewed. BP (!) 136/64   Pulse 77   Temp (!) 97.3 F (36.3 C) (Temporal)   SpO2 98%   Physical Examination:  General: Awake, alert, appears somewhat grey, No acute distress HEENT: sclera white Cardio: regular rate and rhythm, S1S2 heard, blowing systolic murmurs appreciated Pulm: clear to auscultation bilaterally, no wheezes, rhonchi or rales; normal work of breathing on room air MSK: able to self propel in wheelchair. RLE with BKA. Mild soft tissue swelling appreciated.  Assessment/ Plan: 69 y.o. male   1. Status post below-knee amputation of right  lower extremity ([place]) Plans for prosthetic at some point.  He has follow-up with orthopedist tomorrow.  Continue home health physical therapy.  I definitely think he has an ongoing need for this.  Unsure if he has personal care services in place of but I will see if CCM can arrange this if not.  2. PAD (peripheral artery disease) ([place]) He has completely stopped smoking.  Continue current regimen as prescribed  3. Gastrointestinal hemorrhage, unspecified gastrointestinal hemorrhage type Check fingerstick hemoglobin.  Last known hemoglobin was 7.8. - Hemoglobin, fingerstick  No orders of the defined types were placed in this encounter.  No orders of the defined types were placed in this encounter.    [name], DO [place] [phone]

## 2020-06-17 ENCOUNTER — Other Ambulatory Visit: Payer: Self-pay | Admitting: Nurse Practitioner

## 2020-06-17 ENCOUNTER — Other Ambulatory Visit: Payer: Self-pay

## 2020-06-17 ENCOUNTER — Encounter: Payer: Self-pay | Admitting: Family Medicine

## 2020-06-17 ENCOUNTER — Encounter (HOSPITAL_COMMUNITY): Payer: Self-pay

## 2020-06-17 DIAGNOSIS — T8130XA Disruption of wound, unspecified, initial encounter: Secondary | ICD-10-CM | POA: Diagnosis present

## 2020-06-17 DIAGNOSIS — K219 Gastro-esophageal reflux disease without esophagitis: Secondary | ICD-10-CM | POA: Diagnosis present

## 2020-06-17 DIAGNOSIS — I714 Abdominal aortic aneurysm, without rupture: Secondary | ICD-10-CM | POA: Diagnosis not present

## 2020-06-17 DIAGNOSIS — E1165 Type 2 diabetes mellitus with hyperglycemia: Secondary | ICD-10-CM | POA: Diagnosis not present

## 2020-06-17 DIAGNOSIS — T8743 Infection of amputation stump, right lower extremity: Principal | ICD-10-CM | POA: Diagnosis present

## 2020-06-17 DIAGNOSIS — Z823 Family history of stroke: Secondary | ICD-10-CM | POA: Diagnosis not present

## 2020-06-17 DIAGNOSIS — Z833 Family history of diabetes mellitus: Secondary | ICD-10-CM

## 2020-06-17 DIAGNOSIS — E1169 Type 2 diabetes mellitus with other specified complication: Secondary | ICD-10-CM | POA: Diagnosis present

## 2020-06-17 DIAGNOSIS — I70229 Atherosclerosis of native arteries of extremities with rest pain, unspecified extremity: Secondary | ICD-10-CM | POA: Diagnosis not present

## 2020-06-17 DIAGNOSIS — Z809 Family history of malignant neoplasm, unspecified: Secondary | ICD-10-CM | POA: Diagnosis not present

## 2020-06-17 DIAGNOSIS — Z79891 Long term (current) use of opiate analgesic: Secondary | ICD-10-CM

## 2020-06-17 DIAGNOSIS — Z7982 Long term (current) use of aspirin: Secondary | ICD-10-CM

## 2020-06-17 DIAGNOSIS — D62 Acute posthemorrhagic anemia: Secondary | ICD-10-CM | POA: Diagnosis not present

## 2020-06-17 DIAGNOSIS — L89312 Pressure ulcer of right buttock, stage 2: Secondary | ICD-10-CM | POA: Diagnosis not present

## 2020-06-17 DIAGNOSIS — Z89511 Acquired absence of right leg below knee: Secondary | ICD-10-CM | POA: Diagnosis not present

## 2020-06-17 DIAGNOSIS — E43 Unspecified severe protein-calorie malnutrition: Secondary | ICD-10-CM | POA: Diagnosis not present

## 2020-06-17 DIAGNOSIS — R7989 Other specified abnormal findings of blood chemistry: Secondary | ICD-10-CM | POA: Diagnosis not present

## 2020-06-17 DIAGNOSIS — I152 Hypertension secondary to endocrine disorders: Secondary | ICD-10-CM | POA: Diagnosis not present

## 2020-06-17 DIAGNOSIS — Z7984 Long term (current) use of oral hypoglycemic drugs: Secondary | ICD-10-CM | POA: Diagnosis not present

## 2020-06-17 DIAGNOSIS — E1151 Type 2 diabetes mellitus with diabetic peripheral angiopathy without gangrene: Secondary | ICD-10-CM | POA: Diagnosis not present

## 2020-06-17 DIAGNOSIS — T8149XA Infection following a procedure, other surgical site, initial encounter: Secondary | ICD-10-CM | POA: Diagnosis not present

## 2020-06-17 DIAGNOSIS — D509 Iron deficiency anemia, unspecified: Secondary | ICD-10-CM | POA: Diagnosis not present

## 2020-06-17 DIAGNOSIS — J449 Chronic obstructive pulmonary disease, unspecified: Secondary | ICD-10-CM | POA: Diagnosis present

## 2020-06-17 DIAGNOSIS — L8990 Pressure ulcer of unspecified site, unspecified stage: Secondary | ICD-10-CM | POA: Diagnosis not present

## 2020-06-17 DIAGNOSIS — R52 Pain, unspecified: Secondary | ICD-10-CM | POA: Diagnosis not present

## 2020-06-17 DIAGNOSIS — Z79899 Other long term (current) drug therapy: Secondary | ICD-10-CM | POA: Diagnosis not present

## 2020-06-17 DIAGNOSIS — D649 Anemia, unspecified: Secondary | ICD-10-CM

## 2020-06-17 DIAGNOSIS — E785 Hyperlipidemia, unspecified: Secondary | ICD-10-CM | POA: Diagnosis present

## 2020-06-17 DIAGNOSIS — Z9889 Other specified postprocedural states: Secondary | ICD-10-CM | POA: Diagnosis not present

## 2020-06-17 DIAGNOSIS — K2950 Unspecified chronic gastritis without bleeding: Secondary | ICD-10-CM | POA: Diagnosis not present

## 2020-06-17 DIAGNOSIS — Z20822 Contact with and (suspected) exposure to covid-19: Secondary | ICD-10-CM | POA: Diagnosis not present

## 2020-06-17 DIAGNOSIS — Z880 Allergy status to penicillin: Secondary | ICD-10-CM

## 2020-06-17 DIAGNOSIS — Y835 Amputation of limb(s) as the cause of abnormal reaction of the patient, or of later complication, without mention of misadventure at the time of the procedure: Secondary | ICD-10-CM | POA: Diagnosis present

## 2020-06-17 DIAGNOSIS — E1140 Type 2 diabetes mellitus with diabetic neuropathy, unspecified: Secondary | ICD-10-CM | POA: Diagnosis not present

## 2020-06-17 DIAGNOSIS — M24569 Contracture, unspecified knee: Secondary | ICD-10-CM | POA: Diagnosis not present

## 2020-06-17 DIAGNOSIS — I2510 Atherosclerotic heart disease of native coronary artery without angina pectoris: Secondary | ICD-10-CM | POA: Diagnosis not present

## 2020-06-17 DIAGNOSIS — I119 Hypertensive heart disease without heart failure: Secondary | ICD-10-CM | POA: Diagnosis not present

## 2020-06-17 DIAGNOSIS — D519 Vitamin B12 deficiency anemia, unspecified: Secondary | ICD-10-CM | POA: Diagnosis not present

## 2020-06-17 DIAGNOSIS — F17210 Nicotine dependence, cigarettes, uncomplicated: Secondary | ICD-10-CM | POA: Diagnosis present

## 2020-06-17 DIAGNOSIS — L89322 Pressure ulcer of left buttock, stage 2: Secondary | ICD-10-CM | POA: Diagnosis present

## 2020-06-17 DIAGNOSIS — Z4781 Encounter for orthopedic aftercare following surgical amputation: Secondary | ICD-10-CM | POA: Diagnosis not present

## 2020-06-17 NOTE — ED Triage Notes (Signed)
Pt presents to ED with complaints of bleeding of right leg at amputation site. Pt states he had a staple removed from the site last week and the bleeding started today.

## 2020-06-18 ENCOUNTER — Ambulatory Visit: Payer: Medicare Other | Admitting: Physician Assistant

## 2020-06-18 ENCOUNTER — Telehealth: Payer: Medicare Other | Admitting: *Deleted

## 2020-06-18 ENCOUNTER — Emergency Department (HOSPITAL_COMMUNITY): Payer: Medicare Other

## 2020-06-18 ENCOUNTER — Inpatient Hospital Stay (HOSPITAL_COMMUNITY)
Admission: EM | Admit: 2020-06-18 | Discharge: 2020-06-22 | DRG: 475 | Disposition: A | Discharge status: Home Health | Payer: Medicare Other | Attending: Internal Medicine | Admitting: Internal Medicine

## 2020-06-18 ENCOUNTER — Other Ambulatory Visit: Payer: Self-pay | Admitting: Physician Assistant

## 2020-06-18 DIAGNOSIS — D649 Anemia, unspecified: Secondary | ICD-10-CM | POA: Diagnosis not present

## 2020-06-18 DIAGNOSIS — M24569 Contracture, unspecified knee: Secondary | ICD-10-CM | POA: Diagnosis not present

## 2020-06-18 DIAGNOSIS — E785 Hyperlipidemia, unspecified: Secondary | ICD-10-CM | POA: Diagnosis not present

## 2020-06-18 DIAGNOSIS — Z20822 Contact with and (suspected) exposure to covid-19: Secondary | ICD-10-CM | POA: Diagnosis not present

## 2020-06-18 DIAGNOSIS — Z79899 Other long term (current) drug therapy: Secondary | ICD-10-CM | POA: Diagnosis not present

## 2020-06-18 DIAGNOSIS — T8743 Infection of amputation stump, right lower extremity: Secondary | ICD-10-CM | POA: Diagnosis present

## 2020-06-18 DIAGNOSIS — I10 Essential (primary) hypertension: Secondary | ICD-10-CM | POA: Diagnosis not present

## 2020-06-18 DIAGNOSIS — E1169 Type 2 diabetes mellitus with other specified complication: Secondary | ICD-10-CM | POA: Diagnosis present

## 2020-06-18 DIAGNOSIS — E1151 Type 2 diabetes mellitus with diabetic peripheral angiopathy without gangrene: Secondary | ICD-10-CM | POA: Diagnosis not present

## 2020-06-18 DIAGNOSIS — E1165 Type 2 diabetes mellitus with hyperglycemia: Secondary | ICD-10-CM | POA: Diagnosis present

## 2020-06-18 DIAGNOSIS — F17210 Nicotine dependence, cigarettes, uncomplicated: Secondary | ICD-10-CM | POA: Diagnosis present

## 2020-06-18 DIAGNOSIS — Z7984 Long term (current) use of oral hypoglycemic drugs: Secondary | ICD-10-CM | POA: Diagnosis not present

## 2020-06-18 DIAGNOSIS — K219 Gastro-esophageal reflux disease without esophagitis: Secondary | ICD-10-CM | POA: Diagnosis not present

## 2020-06-18 DIAGNOSIS — R52 Pain, unspecified: Secondary | ICD-10-CM

## 2020-06-18 DIAGNOSIS — E1140 Type 2 diabetes mellitus with diabetic neuropathy, unspecified: Secondary | ICD-10-CM | POA: Diagnosis not present

## 2020-06-18 DIAGNOSIS — L89310 Pressure ulcer of right buttock, unstageable: Secondary | ICD-10-CM | POA: Diagnosis present

## 2020-06-18 DIAGNOSIS — D473 Essential (hemorrhagic) thrombocythemia: Secondary | ICD-10-CM

## 2020-06-18 DIAGNOSIS — Z880 Allergy status to penicillin: Secondary | ICD-10-CM | POA: Diagnosis not present

## 2020-06-18 DIAGNOSIS — E1159 Type 2 diabetes mellitus with other circulatory complications: Secondary | ICD-10-CM | POA: Diagnosis not present

## 2020-06-18 DIAGNOSIS — I152 Hypertension secondary to endocrine disorders: Secondary | ICD-10-CM | POA: Diagnosis not present

## 2020-06-18 DIAGNOSIS — Z89511 Acquired absence of right leg below knee: Secondary | ICD-10-CM | POA: Diagnosis not present

## 2020-06-18 DIAGNOSIS — Z7982 Long term (current) use of aspirin: Secondary | ICD-10-CM | POA: Diagnosis not present

## 2020-06-18 DIAGNOSIS — Z79891 Long term (current) use of opiate analgesic: Secondary | ICD-10-CM | POA: Diagnosis not present

## 2020-06-18 DIAGNOSIS — J449 Chronic obstructive pulmonary disease, unspecified: Secondary | ICD-10-CM | POA: Diagnosis not present

## 2020-06-18 DIAGNOSIS — L89302 Pressure ulcer of unspecified buttock, stage 2: Secondary | ICD-10-CM | POA: Diagnosis not present

## 2020-06-18 DIAGNOSIS — I70229 Atherosclerosis of native arteries of extremities with rest pain, unspecified extremity: Secondary | ICD-10-CM | POA: Diagnosis not present

## 2020-06-18 DIAGNOSIS — Z809 Family history of malignant neoplasm, unspecified: Secondary | ICD-10-CM | POA: Diagnosis not present

## 2020-06-18 DIAGNOSIS — Z833 Family history of diabetes mellitus: Secondary | ICD-10-CM | POA: Diagnosis not present

## 2020-06-18 DIAGNOSIS — Z823 Family history of stroke: Secondary | ICD-10-CM | POA: Diagnosis not present

## 2020-06-18 DIAGNOSIS — T8141XA Infection following a procedure, superficial incisional surgical site, initial encounter: Secondary | ICD-10-CM

## 2020-06-18 DIAGNOSIS — I714 Abdominal aortic aneurysm, without rupture: Secondary | ICD-10-CM | POA: Diagnosis not present

## 2020-06-18 DIAGNOSIS — Z4781 Encounter for orthopedic aftercare following surgical amputation: Secondary | ICD-10-CM | POA: Diagnosis not present

## 2020-06-18 DIAGNOSIS — I70201 Unspecified atherosclerosis of native arteries of extremities, right leg: Secondary | ICD-10-CM | POA: Diagnosis not present

## 2020-06-18 DIAGNOSIS — L8990 Pressure ulcer of unspecified site, unspecified stage: Secondary | ICD-10-CM | POA: Diagnosis not present

## 2020-06-18 DIAGNOSIS — I96 Gangrene, not elsewhere classified: Secondary | ICD-10-CM

## 2020-06-18 DIAGNOSIS — T8781 Dehiscence of amputation stump: Secondary | ICD-10-CM | POA: Diagnosis not present

## 2020-06-18 DIAGNOSIS — Z419 Encounter for procedure for purposes other than remedying health state, unspecified: Secondary | ICD-10-CM

## 2020-06-18 DIAGNOSIS — D62 Acute posthemorrhagic anemia: Secondary | ICD-10-CM | POA: Diagnosis not present

## 2020-06-18 DIAGNOSIS — Y835 Amputation of limb(s) as the cause of abnormal reaction of the patient, or of later complication, without mention of misadventure at the time of the procedure: Secondary | ICD-10-CM | POA: Diagnosis present

## 2020-06-18 DIAGNOSIS — R7989 Other specified abnormal findings of blood chemistry: Secondary | ICD-10-CM | POA: Diagnosis not present

## 2020-06-18 DIAGNOSIS — T8130XA Disruption of wound, unspecified, initial encounter: Secondary | ICD-10-CM | POA: Diagnosis not present

## 2020-06-18 DIAGNOSIS — G8918 Other acute postprocedural pain: Secondary | ICD-10-CM | POA: Diagnosis not present

## 2020-06-18 DIAGNOSIS — D75839 Thrombocytosis, unspecified: Secondary | ICD-10-CM | POA: Diagnosis present

## 2020-06-18 DIAGNOSIS — L89312 Pressure ulcer of right buttock, stage 2: Secondary | ICD-10-CM | POA: Diagnosis not present

## 2020-06-18 DIAGNOSIS — Z9889 Other specified postprocedural states: Secondary | ICD-10-CM | POA: Diagnosis not present

## 2020-06-18 LAB — CBC WITH DIFFERENTIAL/PLATELET
Abs Immature Granulocytes: 0.05 10*3/uL (ref 0.00–0.07)
Basophils Absolute: 0 10*3/uL (ref 0.0–0.1)
Basophils Relative: 0 %
Eosinophils Absolute: 0.3 10*3/uL (ref 0.0–0.5)
Eosinophils Relative: 4 %
HCT: 29.3 % — ABNORMAL LOW (ref 39.0–52.0)
Hemoglobin: 8.5 g/dL — ABNORMAL LOW (ref 13.0–17.0)
Immature Granulocytes: 1 %
Lymphocytes Relative: 17 %
Lymphs Abs: 1.5 10*3/uL (ref 0.7–4.0)
MCH: 23.3 pg — ABNORMAL LOW (ref 26.0–34.0)
MCHC: 29 g/dL — ABNORMAL LOW (ref 30.0–36.0)
MCV: 80.3 fL (ref 80.0–100.0)
Monocytes Absolute: 0.6 10*3/uL (ref 0.1–1.0)
Monocytes Relative: 6 %
Neutro Abs: 6.5 10*3/uL (ref 1.7–7.7)
Neutrophils Relative %: 72 %
Platelets: 480 10*3/uL — ABNORMAL HIGH (ref 150–400)
RBC: 3.65 MIL/uL — ABNORMAL LOW (ref 4.22–5.81)
RDW: 19.2 % — ABNORMAL HIGH (ref 11.5–15.5)
WBC: 9 10*3/uL (ref 4.0–10.5)
nRBC: 0 % (ref 0.0–0.2)

## 2020-06-18 LAB — BASIC METABOLIC PANEL
Anion gap: 9 (ref 5–15)
BUN: 22 mg/dL (ref 8–23)
CO2: 27 mmol/L (ref 22–32)
Calcium: 9.1 mg/dL (ref 8.9–10.3)
Chloride: 102 mmol/L (ref 98–111)
Creatinine, Ser: 0.71 mg/dL (ref 0.61–1.24)
GFR calc Af Amer: >60 mL/min (ref >60)
GFR calc non Af Amer: >60 mL/min (ref >60)
Glucose, Bld: 255 mg/dL — ABNORMAL HIGH (ref 70–99)
Potassium: 3.9 mmol/L (ref 3.5–5.1)
Sodium: 138 mmol/L (ref 135–145)

## 2020-06-18 LAB — PROTIME-INR
INR: 1 (ref 0.8–1.2)
Prothrombin Time: 12.8 seconds (ref 11.4–15.2)

## 2020-06-18 LAB — CBG MONITORING, ED
Glucose-Capillary: 239 mg/dL — ABNORMAL HIGH (ref 70–99)
Glucose-Capillary: 198 mg/dL — ABNORMAL HIGH (ref 70–99)
Glucose-Capillary: 298 mg/dL — ABNORMAL HIGH (ref 70–99)
Glucose-Capillary: 167 mg/dL — ABNORMAL HIGH (ref 70–99)

## 2020-06-18 LAB — LACTIC ACID, PLASMA: Lactic Acid, Venous: 1.9 mmol/L (ref 0.5–1.9)

## 2020-06-18 LAB — GLUCOSE, CAPILLARY: Glucose-Capillary: 187 mg/dL — ABNORMAL HIGH (ref 70–99)

## 2020-06-18 LAB — SARS CORONAVIRUS 2 BY RT PCR (HOSPITAL ORDER, PERFORMED IN ~~LOC~~ HOSPITAL LAB): SARS Coronavirus 2: NEGATIVE

## 2020-06-18 LAB — TYPE AND SCREEN
ABO/RH(D): A POS
Antibody Screen: NEGATIVE

## 2020-06-18 IMAGING — DX DG KNEE 1-2V*R*
2 series · 2 of 2 positions shown · non-contrast
Comparison: [DATE]

CLINICAL DATA: History of recent BKA with drainage from the
surgical site

EXAM:
RIGHT KNEE - 1-2 VIEW

[knee ap]
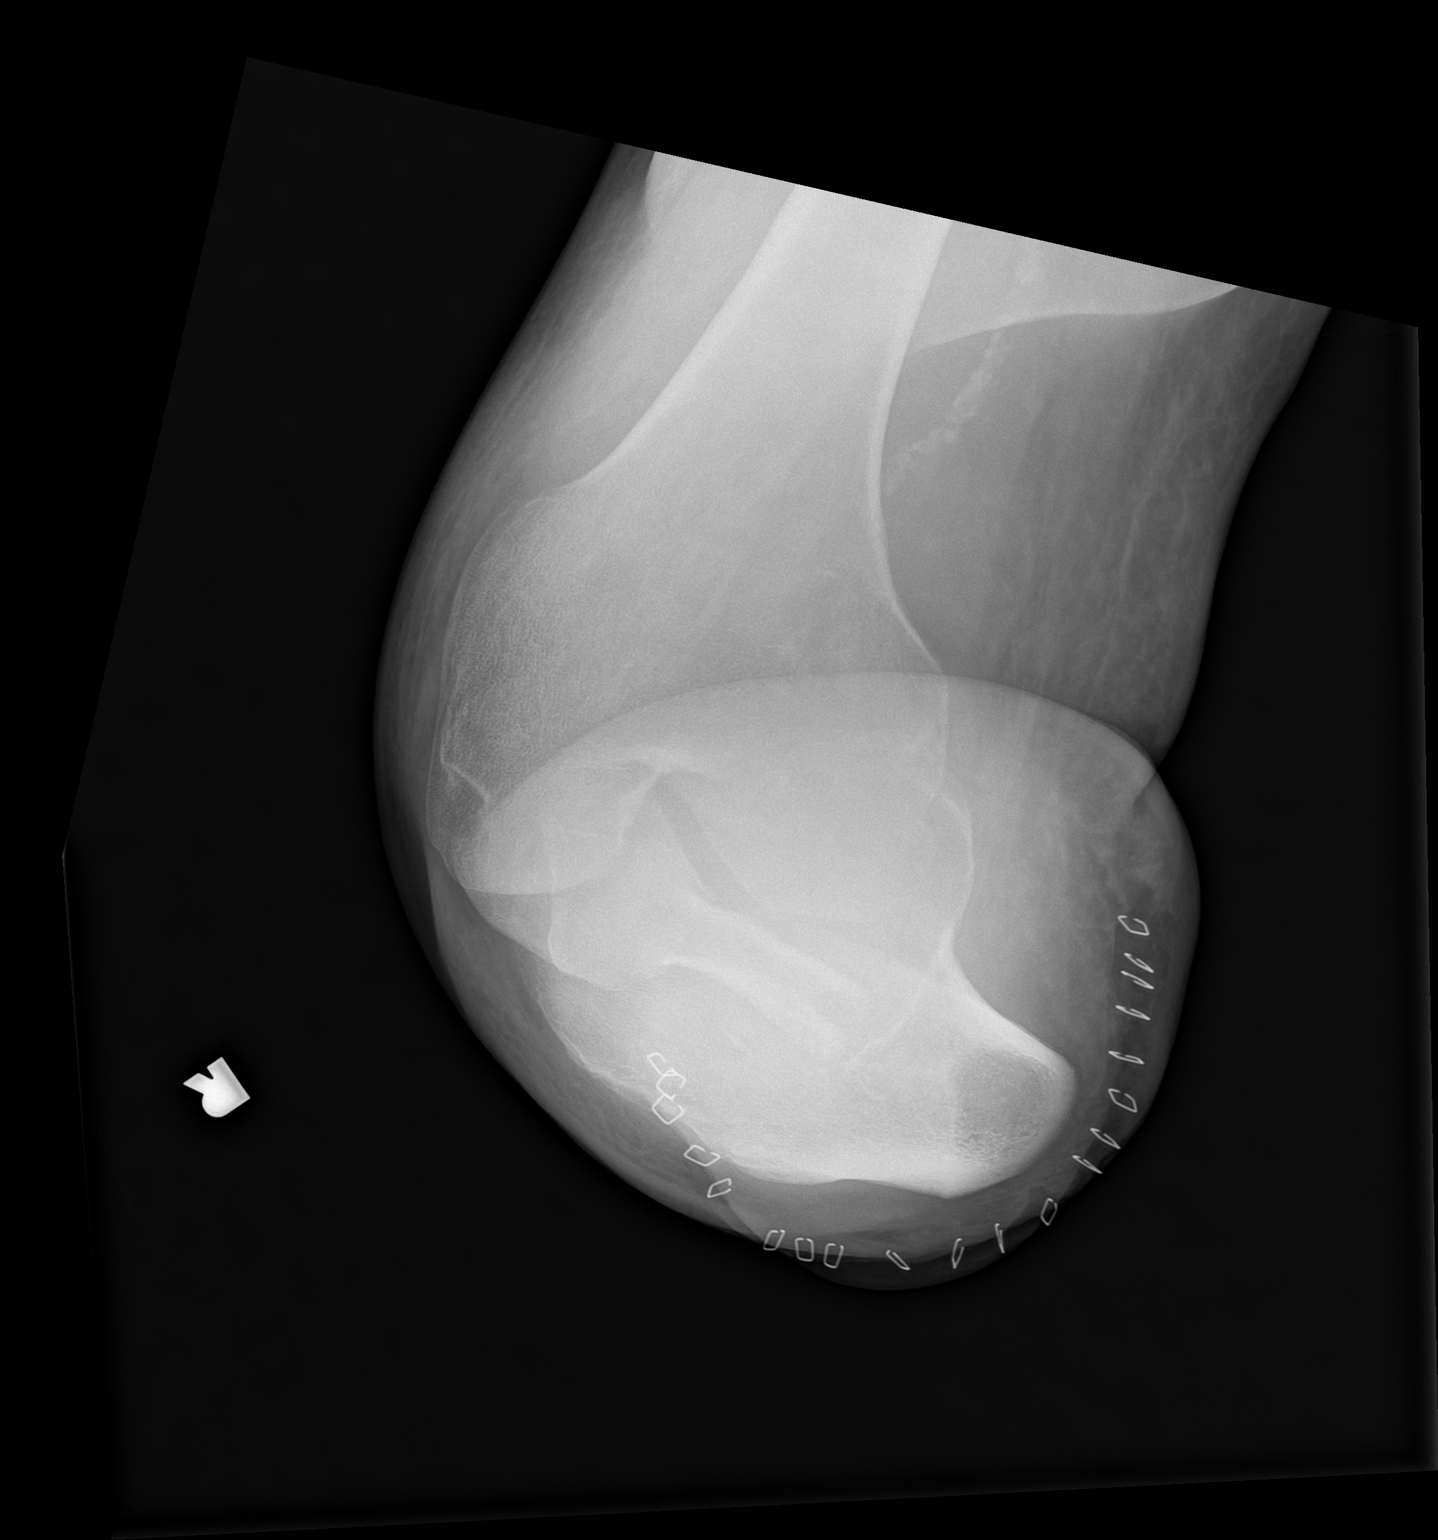

[knee lat]
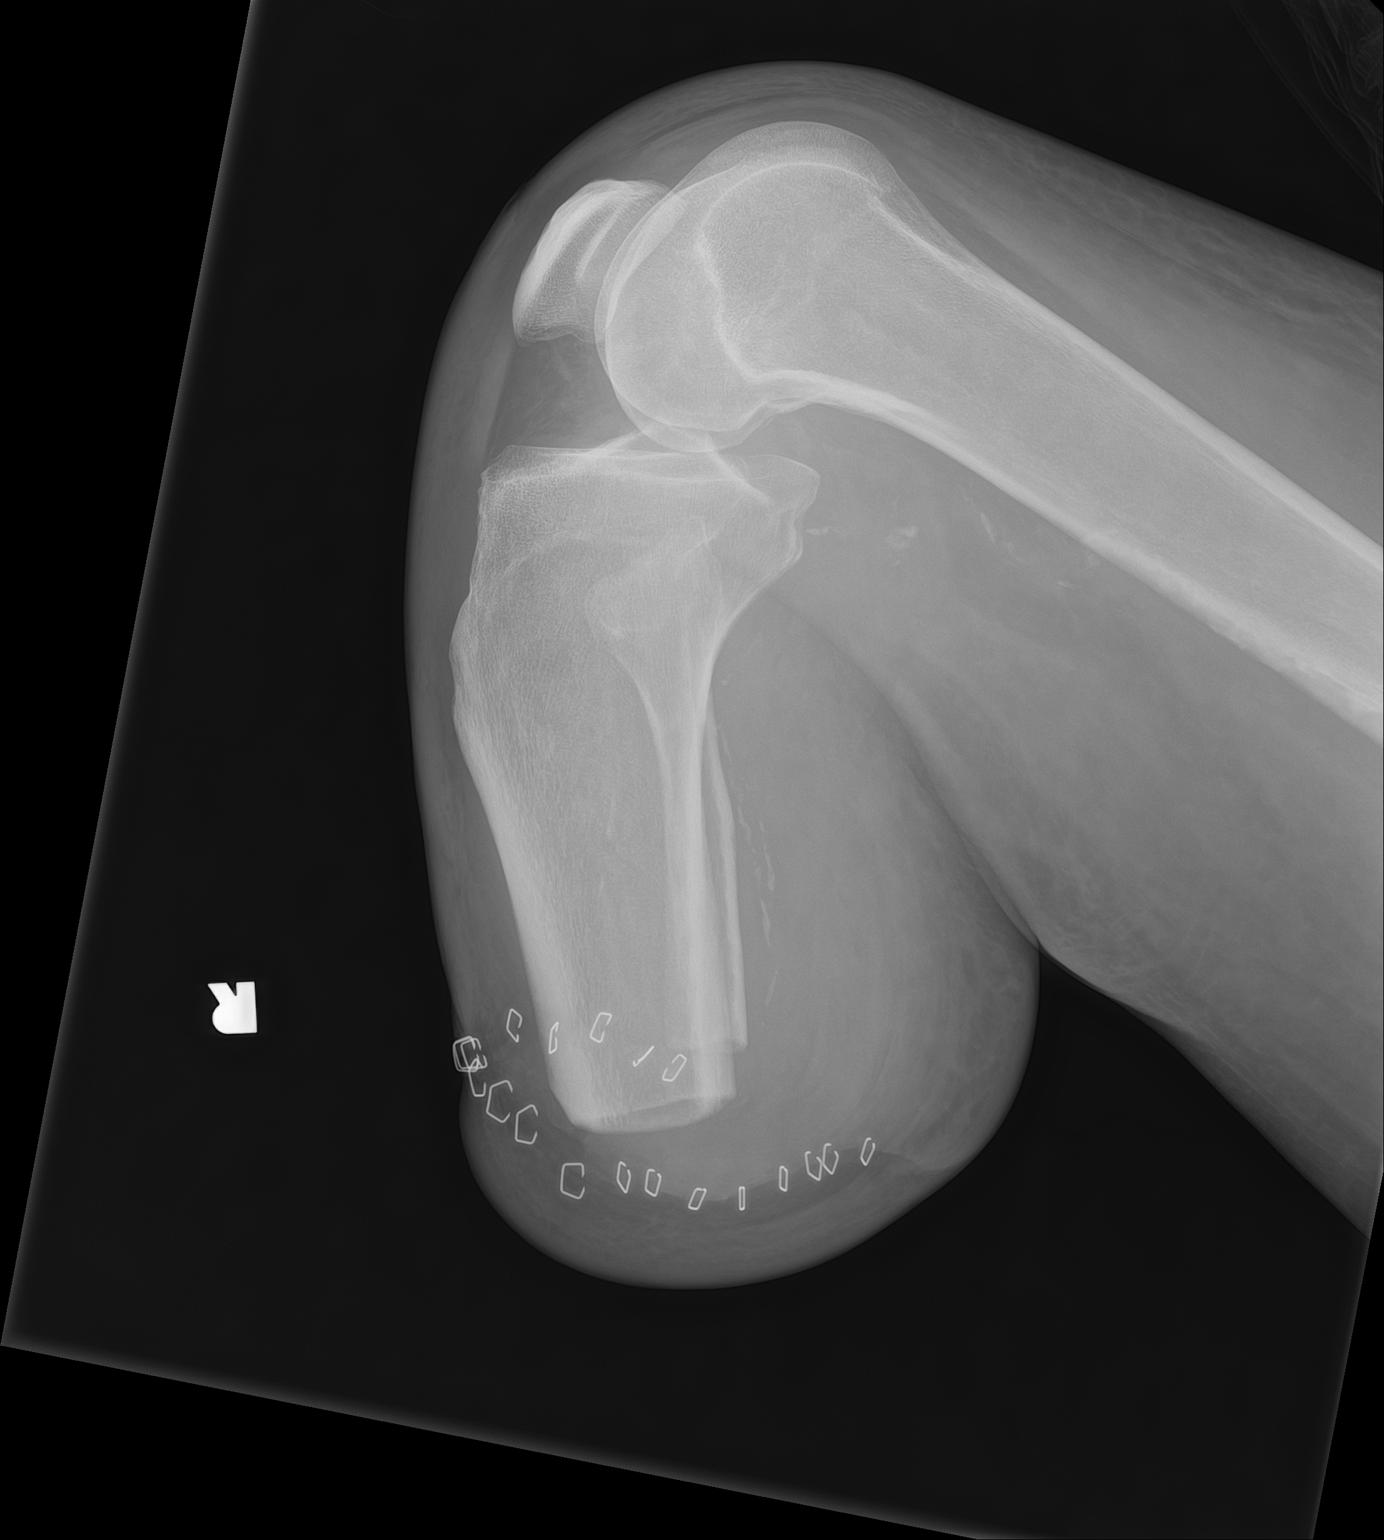

[2 of 2 positions shown; findings below may reference images not displayed]

FINDINGS: Changes of recent below the knee amputation are seen. Multiple
surgical staples are noted. No open wound is seen. No bony erosive
changes are noted.
IMPRESSION: Postsurgical change without acute abnormality.

## 2020-06-18 MED ORDER — MONTELUKAST SODIUM 10 MG PO TABS
10.0000 mg | ORAL_TABLET | Freq: Every day | ORAL | Status: DC
  Administered 2020-06-19 – 2020-06-21 (×4): 10 mg via ORAL
  Filled 2020-06-18 (×4): qty 1

## 2020-06-18 MED ORDER — NICOTINE 21 MG/24HR TD PT24
21.0000 mg | MEDICATED_PATCH | Freq: Once | TRANSDERMAL | Status: AC
  Administered 2020-06-18: 21 mg via TRANSDERMAL
  Filled 2020-06-18: qty 1

## 2020-06-18 MED ORDER — SODIUM CHLORIDE 0.9 % IV SOLN
2.0000 g | Freq: Once | INTRAVENOUS | Status: AC
  Administered 2020-06-18: 2 g via INTRAVENOUS
  Filled 2020-06-18: qty 2

## 2020-06-18 MED ORDER — ACETAMINOPHEN 325 MG PO TABS: 650.0000 mg | ORAL_TABLET | Freq: Four times a day (QID) | ORAL | Status: DC | PRN

## 2020-06-18 MED ORDER — VANCOMYCIN HCL IN DEXTROSE 1-5 GM/200ML-% IV SOLN
1000.0000 mg | Freq: Once | INTRAVENOUS | Status: AC
  Administered 2020-06-18: 1000 mg via INTRAVENOUS
  Filled 2020-06-18: qty 200

## 2020-06-18 MED ORDER — INSULIN ASPART 100 UNIT/ML ~~LOC~~ SOLN
0.0000 [IU] | Freq: Three times a day (TID) | SUBCUTANEOUS | Status: DC
  Administered 2020-06-18: 3 [IU] via SUBCUTANEOUS
  Administered 2020-06-18: 8 [IU] via SUBCUTANEOUS
  Administered 2020-06-18: 5 [IU] via SUBCUTANEOUS
  Administered 2020-06-19: 3 [IU] via SUBCUTANEOUS
  Administered 2020-06-19: 2 [IU] via SUBCUTANEOUS
  Administered 2020-06-19: 3 [IU] via SUBCUTANEOUS
  Administered 2020-06-20: 5 [IU] via SUBCUTANEOUS
  Administered 2020-06-20: 8 [IU] via SUBCUTANEOUS
  Administered 2020-06-20: 11 [IU] via SUBCUTANEOUS
  Administered 2020-06-21 (×2): 3 [IU] via SUBCUTANEOUS
  Administered 2020-06-21: 2 [IU] via SUBCUTANEOUS
  Administered 2020-06-22 (×2): 3 [IU] via SUBCUTANEOUS
  Filled 2020-06-18 (×3): qty 1

## 2020-06-18 MED ORDER — INSULIN ASPART 100 UNIT/ML ~~LOC~~ SOLN
0.0000 [IU] | Freq: Every day | SUBCUTANEOUS | Status: DC
  Administered 2020-06-19: 3 [IU] via SUBCUTANEOUS

## 2020-06-18 MED ORDER — ACETAMINOPHEN 650 MG RE SUPP: 650.0000 mg | Freq: Four times a day (QID) | RECTAL | Status: DC | PRN

## 2020-06-18 MED ORDER — SODIUM CHLORIDE 0.9 % IV BOLUS
1000.0000 mL | Freq: Once | INTRAVENOUS | Status: AC
  Administered 2020-06-18: 1000 mL via INTRAVENOUS

## 2020-06-18 MED ORDER — HEPARIN SODIUM (PORCINE) 5000 UNIT/ML IJ SOLN
5000.0000 [IU] | Freq: Three times a day (TID) | INTRAMUSCULAR | Status: DC
  Administered 2020-06-19 – 2020-06-22 (×8): 5000 [IU] via SUBCUTANEOUS
  Filled 2020-06-18 (×10): qty 1

## 2020-06-18 MED ORDER — VANCOMYCIN HCL 750 MG/150ML IV SOLN
750.0000 mg | Freq: Two times a day (BID) | INTRAVENOUS | Status: AC
  Administered 2020-06-18 – 2020-06-20 (×5): 750 mg via INTRAVENOUS
  Filled 2020-06-18 (×7): qty 150

## 2020-06-18 MED ORDER — SODIUM CHLORIDE 0.9 % IV SOLN
2.0000 g | Freq: Two times a day (BID) | INTRAVENOUS | Status: AC
  Administered 2020-06-18 – 2020-06-20 (×5): 2 g via INTRAVENOUS
  Filled 2020-06-18 (×5): qty 2

## 2020-06-18 NOTE — ED Provider Notes (Signed)
[facility] EMERGENCY DEPARTMENT Provider Note   CSN: [id number] Arrival date & time: 06/17/20  2058     History Chief Complaint  Patient presents with  . Leg Problem    JEDEDIAH NODA is a 69 y.o. male.  Patient with history of COPD, hypertension, hyperlipidemia, AAA, recent BKA June 27 by Dr. Sharol Given in [place] presenting with bleeding and wound infection to his right stump.  Family states over the past several days he noticed of the wound opened up more there is been increased drainage and bleeding.  Patient saw Dr. Sharol Given in the office about 5 days ago was placed on an unknown antibiotic which he has been taking.  He saw his PCP 2 days ago and she thought the wound was healing well.  Family states the wound opened up some more today and there is some drainage and bleeding and foul-smelling discharge.  Patient denies any increased pain.  No fever, chills, nausea or vomiting.  No chest pain.  No abdominal pain or back pain.  He is supposed to see his orthopedic doctor tomorrow.  The nurse line told him to come to the hospital for IV antibiotics.  The history is provided by the patient and a relative.       Past Medical History:  Diagnosis Date  . COPD (chronic obstructive pulmonary disease) ([place])   . Diabetes mellitus without complication ([place])   . Gangrene of toe of right foot ([place])   . GERD (gastroesophageal reflux disease)   . Hyperlipidemia   . Hypertension     Patient Active Problem List   Diagnosis Date Noted  . Pressure injury of skin 05/16/2020  . Severe protein-calorie malnutrition ([place])   . Gangrene of right foot ([place]) 03/26/2020  . PAD (peripheral artery disease) ([place]) 03/26/2020  . Anemia 03/12/2020  . GI bleed 03/12/2020  . AAA (abdominal aortic aneurysm) without rupture ([place]) 02/28/2020  . Critical lower limb ischemia 02/28/2020  . BPH (benign prostatic hyperplasia) 10/31/2014  . Systolic ejection murmur 35/57/3220  . Bilateral carotid bruits 10/31/2014  .  Erectile dysfunction 11/05/2013  . Bilateral carotid artery disease ([place]) 04/11/2013  . Hyperlipidemia associated with type 2 diabetes mellitus ([place]) 04/11/2013  . Hypertension associated with diabetes ([place]) 04/11/2013  . COPD exacerbation ([place]) 04/11/2013  . Diabetes mellitus type 2 controlled 04/11/2013  . Diabetic neuropathy, type II diabetes mellitus ([place]) 03/15/2013    Past Surgical History:  Procedure Laterality Date  . AMPUTATION Right 05/17/2020   Procedure: AMPUTATION BELOW RIGHT KNEE;  Surgeon: Newt Minion, MD;  Location: [place];  Service: Orthopedics;  Laterality: Right;  . APPLICATION OF WOUND VAC Right 05/17/2020   Procedure: APPLICATION OF WOUND VAC;  Surgeon: Newt Minion, MD;  Location: [place];  Service: Orthopedics;  Laterality: Right;  . BIOPSY  03/13/2020   Procedure: BIOPSY;  Surgeon: Daneil Dolin, MD;  [facility];  Service: Endoscopy;;  gastric biopsy  . ESOPHAGOGASTRODUODENOSCOPY N/A 03/13/2020   Procedure: ESOPHAGOGASTRODUODENOSCOPY (EGD);  Surgeon: Daneil Dolin, MD;  [facility];  Service: Endoscopy;  Laterality: N/A;  . FLEXIBLE SIGMOIDOSCOPY N/A 05/06/2020   Procedure: FLEXIBLE SIGMOIDOSCOPY;  Surgeon: Daneil Dolin, MD;  [facility];  Service: Endoscopy;  Laterality: N/A;  . Thumb surgery         Family History  Problem Relation Age of Onset  . Cancer Father   . Diabetes Sister   . Stroke Brother   . Healthy Sister   .  Healthy Sister   . Colon cancer Neg Hx   . Gastric cancer Neg Hx     Social History   Tobacco Use  . Smoking status: Current Every Day Smoker    Packs/day: 0.50    Years: 60.00    Pack years: 30.00    Types: Cigarettes  . Smokeless tobacco: Never Used  Vaping Use  . Vaping Use: Former  Substance Use Topics  . Alcohol use: No    Alcohol/week: 0.0 standard drinks  . Drug use: No    Home Medications Prior to Admission medications   Medication Sig Start Date End Date Taking?  Authorizing Provider  amLODipine (NORVASC) 10 MG tablet Take 1 tablet (10 mg total) by mouth daily. 03/14/20   Roxan Hockey, MD  aspirin EC 81 MG tablet Take 1 tablet (81 mg total) by mouth daily with breakfast. 03/14/20   Emokpae, Courage, MD  BIOTIN 5000 PO Take 5,000 mcg by mouth daily.     [provider]  cholecalciferol (VITAMIN D) 1000 UNITS tablet Take 1,000 Units by mouth daily.    [provider]  doxycycline (VIBRA-TABS) 100 MG tablet Take 1 tablet (100 mg total) by mouth 2 (two) times daily. 06/11/20   Persons, Bevely Palmer, PA  empagliflozin (JARDIANCE) 25 MG TABS tablet Take 25 mg by mouth daily before breakfast. 03/14/20   Emokpae, Courage, MD  escitalopram (LEXAPRO) 10 MG tablet Take 1 tablet (10 mg total) by mouth daily. 03/14/20   Roxan Hockey, MD  ferrous sulfate 325 (65 FE) MG tablet Take 1 tablet (325 mg total) by mouth daily with breakfast. 03/14/20   Denton Brick, Courage, MD  hydrochlorothiazide (HYDRODIURIL) 25 MG tablet Take 1 tablet (25 mg total) by mouth daily. 03/14/20   Roxan Hockey, MD  lisinopril (ZESTRIL) 40 MG tablet TAKE ONE (1) TABLET EACH DAY Patient taking differently: Take 40 mg by mouth daily.  03/14/20   Roxan Hockey, MD  metFORMIN (GLUCOPHAGE) 1000 MG tablet Take 1 tablet (1,000 mg total) by mouth 2 (two) times daily with a meal. 03/14/20   Emokpae, Courage, MD  metoprolol tartrate (LOPRESSOR) 25 MG tablet Take 1 tablet (25 mg total) by mouth 2 (two) times daily. 03/14/20   Emokpae, Courage, MD  montelukast (SINGULAIR) 10 MG tablet TAKE ONE TABLET DAILY AT BEDTIME Patient taking differently: Take 10 mg by mouth at bedtime.  01/07/20   [name], DO  oxyCODONE (ROXICODONE) 15 MG immediate release tablet Take by mouth. 06/03/20   [provider]  pantoprazole (PROTONIX) 40 MG tablet Take 1 tablet (40 mg total) by mouth daily. 03/15/20   Roxan Hockey, MD  pravastatin (PRAVACHOL) 40 MG tablet Take 1 tablet (40 mg total) by  mouth every evening. 03/14/20   Emokpae, Courage, MD  [name] (JANUVIA) 100 MG tablet Take 1 tablet (100 mg total) by mouth daily. 03/14/20   Roxan Hockey, MD    Allergies    Penicillins  Review of Systems   Review of Systems  Constitutional: Positive for fatigue. Negative for activity change, appetite change and fever.  HENT: Negative for congestion and rhinorrhea.   Eyes: Negative for visual disturbance.  Respiratory: Negative for cough, chest tightness and shortness of breath.   Gastrointestinal: Negative for nausea and vomiting.  Genitourinary: Negative for dysuria, flank pain and hematuria.  Musculoskeletal: Positive for arthralgias and myalgias.  Skin: Positive for wound.  Neurological: Negative for dizziness, weakness and headaches.   all other systems are negative except as noted in the  HPI and PMH.    Physical Exam Updated Vital Signs BP 123/71 (BP Location: Right Arm)   Pulse 71   Temp 98.1 F (36.7 C) (Oral)   Resp 18   Ht 6\' 2"  (1.88 m)   Wt 78 kg   SpO2 98%   BMI 22.08 kg/m   Physical Exam Vitals and nursing note reviewed.  Constitutional:      General: He is not in acute distress.    Appearance: He is well-developed.  HENT:     Head: Normocephalic and atraumatic.     Mouth/Throat:     Pharynx: No oropharyngeal exudate.  Eyes:     Conjunctiva/sclera: Conjunctivae normal.     Pupils: Pupils are equal, round, and reactive to light.  Neck:     Comments: No meningismus. Cardiovascular:     Rate and Rhythm: Normal rate and regular rhythm.     Heart sounds: Normal heart sounds. No murmur heard.   Pulmonary:     Effort: Pulmonary effort is normal. No respiratory distress.     Breath sounds: Normal breath sounds.  Abdominal:     Palpations: Abdomen is soft.     Tenderness: There is no abdominal tenderness. There is no guarding or rebound.  Musculoskeletal:        General: Tenderness present. Normal range of motion.     Cervical back: Normal range  of motion and neck supple.     Comments: Right stump as depicted.  Staples intact with some dehiscence and purulent drainage.  Mild surrounding erythema. Unable to extend knee unable to extend knee.  Skin:    General: Skin is warm.  Neurological:     Mental Status: He is alert and oriented to person, place, and time.     Cranial Nerves: No cranial nerve deficit.     Motor: No abnormal muscle tone.     Coordination: Coordination normal.     Comments: No ataxia on finger to nose bilaterally. No pronator drift. 5/5 strength throughout. CN 2-12 intact.Equal grip strength. Sensation intact.   Psychiatric:        Behavior: Behavior normal.       ED Results / Procedures / Treatments   Labs (all labs ordered are listed, but only abnormal results are displayed) Labs Reviewed  CBC WITH DIFFERENTIAL/PLATELET - Abnormal; Notable for the following components:      Result Value   RBC 3.65 (*)    Hemoglobin 8.5 (*)    HCT 29.3 (*)    MCH 23.3 (*)    MCHC 29.0 (*)    RDW 19.2 (*)    Platelets 480 (*)    All other components within normal limits  BASIC METABOLIC PANEL - Abnormal; Notable for the following components:   Glucose, Bld 255 (*)    All other components within normal limits  CULTURE, BLOOD (ROUTINE X 2)  CULTURE, BLOOD (ROUTINE X 2)  SARS CORONAVIRUS 2 BY RT PCR (HOSPITAL ORDER, [place])  PROTIME-INR  LACTIC ACID, PLASMA  TYPE AND SCREEN    EKG None  Radiology DG Knee 1-2 Views Right  Result Date: 06/18/2020 CLINICAL DATA:  History of recent BKA with drainage from the surgical site EXAM: RIGHT KNEE - 1-2 VIEW COMPARISON:  05/16/2019 FINDINGS: Changes of recent below the knee amputation are seen. Multiple surgical staples are noted. No open wound is seen. No bony erosive changes are noted. IMPRESSION: Postsurgical change without acute abnormality. Electronically Signed   By: Elta Guadeloupe  Lukens M.D.   On: 06/18/2020 03:04    Procedures Procedures  (including critical care time)  Medications Ordered in ED Medications  vancomycin (VANCOCIN) IVPB 1000 mg/200 mL premix (has no administration in time range)    ED Course  I have reviewed the triage vital signs and the nursing notes.  Pertinent labs & imaging results that were available during my care of the patient were reviewed by me and considered in my medical decision making (see chart for details).    MDM Rules/Calculators/A&P                         \Patient with wound infection and dehiscence as above.  Hemodynamics are stable.  We will send labs, culture start IV antibiotics.  Discussed with orthopedics.  Labs show stable anemia.  No leukocytosis.  X-ray without any bony abnormality.  Discussed with patient's surgeon Dr. Sharol Given.  He requests medical admission to the hospitalist service at [facility].  Does not feel he needs any additional imaging of the extremity at this time  D/w Dr. Josephine Cables who will arrange for admission to [facility].   RADIN RAPTIS was evaluated in Emergency Department on 06/18/2020 for the symptoms described in the history of present illness. He was evaluated in the context of the global COVID-19 pandemic, which necessitated consideration that the patient might be at risk for infection with the SARS-CoV-2 virus that causes COVID-19. Institutional protocols and algorithms that pertain to the evaluation of patients at risk for COVID-19 are in a state of rapid change based on information released by regulatory bodies including the CDC and federal and state organizations. These policies and algorithms were followed during the patient's care in the ED.  Final Clinical Impression(s) / ED Diagnoses Final diagnoses:  Infection of superficial incisional surgical site after procedure, initial encounter    Rx / DC Orders ED Discharge Orders    None       Brittinie Wherley, Annie Main, MD 06/18/20 [phone]

## 2020-06-18 NOTE — H&P (Signed)
History and Physical  ALEKSA Todd OJJ:[id number] DOB: 1951/08/06 DOA: 06/18/2020  Referring physician: Ezequiel Essex, MD PCP: [name], DO  Patient coming from: Home  Chief Complaint: Drainage and infection of right BKA stump  HPI: Omar Todd is a 69 y.o. male with medical history significant for hypertension, hyperlipidemia, COPD, AAA and recent BKA on June 27 by Dr. Sharol Given in [place] who presents to the emergency department due to several days of drainage, bleeding and wound infection to the right stump.  Wife was at bedside and provided some of the history.  Patient saw the orthopedic surgeon (Dr. Sharol Given) about 5 days ago, at that time, patient had mild drainage from the wound which was considered to be normal per wife at bedside, he was prescribed with an antibiotic (patient cannot remember the name of the antibiotic) and he states that he has been compliant with the antibiotics.  Patient also had a follow-up with his PCP 2 days ago and the wound was said to be healing well.  Wife states that the wound opened up more yesterday and there was bleeding, drainage and foul-smelling discharge, he contacted his nurse online who asked him to go to the ED for further evaluation and management.  Patient denies being in pain and also denies fever, chills, chest pain, shortness of breath, nausea or vomiting.  ED Course:  In the emergency department, he was hemodynamically stable.  Work-up in the ED showed normocytic anemia, thrombocytosis and hyperglycemia.  SARS coronavirus was negative.  Right knee x-ray showed postsurgical change without acute abnormality.  Patient was started on IV vancomycin and cefepime.  IV hydration was provided.  Orthopedic surgeon (Dr. Sharol Given) was consulted per ED physician and recommended admitting patient to [facility] with plan to follow-up with patient once he arrives at the campus.  Hospitalist was asked to admit patient for further evaluation and  management.  Review of Systems: Constitutional: Negative for chills and fever.  HENT: Negative for ear pain and sore throat.   Eyes: Negative for pain and visual disturbance.  Respiratory: Negative for cough, chest tightness and shortness of breath.   Cardiovascular: Negative for chest pain and palpitations.  Gastrointestinal: Negative for abdominal pain and vomiting.  Endocrine: Negative for polyphagia and polyuria.  Genitourinary: Negative for decreased urine volume, dysuria, enuresis, hematuria, vaginal discharge and vaginal pain.  Musculoskeletal: Positive for right BKA stump wound.  Skin: Positive for right BKA stump wound.   Allergic/Immunologic: Negative for immunocompromised state.  Neurological: Negative for tremors, syncope, speech difficulty, weakness, light-headedness and headaches.  Hematological: Does not bruise/bleed easily.  All other systems reviewed and are negative   Past Medical History:  Diagnosis Date  . COPD (chronic obstructive pulmonary disease) ([place])   . Diabetes mellitus without complication ([place])   . Gangrene of toe of right foot ([place])   . GERD (gastroesophageal reflux disease)   . Hyperlipidemia   . Hypertension    Past Surgical History:  Procedure Laterality Date  . AMPUTATION Right 05/17/2020   Procedure: AMPUTATION BELOW RIGHT KNEE;  Surgeon: Newt Minion, MD;  Location: [place];  Service: Orthopedics;  Laterality: Right;  . APPLICATION OF WOUND VAC Right 05/17/2020   Procedure: APPLICATION OF WOUND VAC;  Surgeon: Newt Minion, MD;  Location: [place];  Service: Orthopedics;  Laterality: Right;  . BIOPSY  03/13/2020   Procedure: BIOPSY;  Surgeon: Daneil Dolin, MD;  [facility];  Service: Endoscopy;;  gastric biopsy  . ESOPHAGOGASTRODUODENOSCOPY  N/A 03/13/2020   Procedure: ESOPHAGOGASTRODUODENOSCOPY (EGD);  Surgeon: Daneil Dolin, MD;  [facility];  Service: Endoscopy;  Laterality: N/A;  . FLEXIBLE SIGMOIDOSCOPY N/A  05/06/2020   Procedure: FLEXIBLE SIGMOIDOSCOPY;  Surgeon: Daneil Dolin, MD;  [facility];  Service: Endoscopy;  Laterality: N/A;  . Thumb surgery      Social History:  reports that he has been smoking cigarettes. He has a 30.00 pack-year smoking history. He has never used smokeless tobacco. He reports that he does not drink alcohol and does not use drugs.   Allergies  Allergen Reactions  . Penicillins Other (See Comments)    Pt. States he "passed out"    Family History  Problem Relation Age of Onset  . Cancer Father   . Diabetes Sister   . Stroke Brother   . Healthy Sister   . Healthy Sister   . Colon cancer Neg Hx   . Gastric cancer Neg Hx      Prior to Admission medications   Medication Sig Start Date End Date Taking? Authorizing Provider  amLODipine (NORVASC) 10 MG tablet Take 1 tablet (10 mg total) by mouth daily. 03/14/20   Roxan Hockey, MD  aspirin EC 81 MG tablet Take 1 tablet (81 mg total) by mouth daily with breakfast. 03/14/20   Emokpae, Courage, MD  BIOTIN 5000 PO Take 5,000 mcg by mouth daily.     [provider]  cholecalciferol (VITAMIN D) 1000 UNITS tablet Take 1,000 Units by mouth daily.    [provider]  doxycycline (VIBRA-TABS) 100 MG tablet Take 1 tablet (100 mg total) by mouth 2 (two) times daily. 06/11/20   Persons, Bevely Palmer, PA  empagliflozin (JARDIANCE) 25 MG TABS tablet Take 25 mg by mouth daily before breakfast. 03/14/20   Emokpae, Courage, MD  escitalopram (LEXAPRO) 10 MG tablet Take 1 tablet (10 mg total) by mouth daily. 03/14/20   Roxan Hockey, MD  ferrous sulfate 325 (65 FE) MG tablet Take 1 tablet (325 mg total) by mouth daily with breakfast. 03/14/20   Denton Brick, Courage, MD  hydrochlorothiazide (HYDRODIURIL) 25 MG tablet Take 1 tablet (25 mg total) by mouth daily. 03/14/20   Roxan Hockey, MD  lisinopril (ZESTRIL) 40 MG tablet TAKE ONE (1) TABLET EACH DAY Patient taking differently: Take 40 mg by mouth daily.   03/14/20   Roxan Hockey, MD  metFORMIN (GLUCOPHAGE) 1000 MG tablet Take 1 tablet (1,000 mg total) by mouth 2 (two) times daily with a meal. 03/14/20   Emokpae, Courage, MD  metoprolol tartrate (LOPRESSOR) 25 MG tablet Take 1 tablet (25 mg total) by mouth 2 (two) times daily. 03/14/20   Emokpae, Courage, MD  montelukast (SINGULAIR) 10 MG tablet TAKE ONE TABLET DAILY AT BEDTIME Patient taking differently: Take 10 mg by mouth at bedtime.  01/07/20   [name], DO  oxyCODONE (ROXICODONE) 15 MG immediate release tablet Take by mouth. 06/03/20   [provider]  pantoprazole (PROTONIX) 40 MG tablet Take 1 tablet (40 mg total) by mouth daily. 03/15/20   Roxan Hockey, MD  pravastatin (PRAVACHOL) 40 MG tablet Take 1 tablet (40 mg total) by mouth every evening. 03/14/20   Emokpae, Courage, MD  [name] (JANUVIA) 100 MG tablet Take 1 tablet (100 mg total) by mouth daily. 03/14/20   Roxan Hockey, MD    Physical Exam: BP (!) 118/51 (BP Location: Left Arm)   Pulse 78   Temp 98.2 F (36.8 C) (Oral)   Resp 20  Ht 6\' 2"  (1.88 m)   Wt 78 kg   SpO2 100%   BMI 22.08 kg/m   . General: 69 y.o. year-old male well developed well nourished in no acute distress.  Alert and oriented x3. Marland Kitchen HEENT: NCAT, EOMI, PERRLA . Neck: Supple, trachea medial . Cardiovascular: Regular rate and rhythm with no rubs or gallops.  No thyromegaly or JVD noted.  No lower extremity edema. 2/4 pulses in all 4 extremities. Marland Kitchen Respiratory: Clear to auscultation with no wheezes or rales. Good inspiratory effort. . Abdomen: Soft nontender nondistended with normal bowel sounds x4 quadrants. . Muskuloskeletal: Tender to palpation around the wound area.  Stump area already cleaned and bandaged.  Neuro: CN II-XII intact, strength, sensation, reflexes . Skin: No ulcerative lesions noted or rashes . Psychiatry: Judgement and insight appear normal. Mood is appropriate for condition and setting          Labs on  Admission:  Basic Metabolic Panel: Recent Labs  Lab 06/18/20 0216  NA 138  K 3.9  CL 102  CO2 27  GLUCOSE 255*  BUN 22  CREATININE 0.71  CALCIUM 9.1   Liver Function Tests: No results for input(s): AST, ALT, ALKPHOS, BILITOT, PROT, ALBUMIN in the last 168 hours. No results for input(s): LIPASE, AMYLASE in the last 168 hours. No results for input(s): AMMONIA in the last 168 hours. CBC: Recent Labs  Lab 06/18/20 0216  WBC 9.0  NEUTROABS 6.5  HGB 8.5*  HCT 29.3*  MCV 80.3  PLT 480*   Cardiac Enzymes: No results for input(s): CKTOTAL, CKMB, CKMBINDEX, TROPONINI in the last 168 hours.  BNP (last 3 results) No results for input(s): BNP in the last 8760 hours.  ProBNP (last 3 results) No results for input(s): PROBNP in the last 8760 hours.  CBG: No results for input(s): GLUCAP in the last 168 hours.  Radiological Exams on Admission: DG Knee 1-2 Views Right  Result Date: 06/18/2020 CLINICAL DATA:  History of recent BKA with drainage from the surgical site EXAM: RIGHT KNEE - 1-2 VIEW COMPARISON:  05/16/2019 FINDINGS: Changes of recent below the knee amputation are seen. Multiple surgical staples are noted. No open wound is seen. No bony erosive changes are noted. IMPRESSION: Postsurgical change without acute abnormality. Electronically Signed   By: Inez Catalina M.D.   On: 06/18/2020 03:04    EKG: I independently viewed the EKG done and my findings are as followed: No EKG was done in the ED  Assessment/Plan Present on Admission: . Right BKA infection ([place]) . Hyperlipidemia associated with type 2 diabetes mellitus ([place]) . Hypertension associated with diabetes ([place])  Principal Problem:   Right BKA infection ([place]) Active Problems:   Hyperlipidemia associated with type 2 diabetes mellitus ([place])   Hypertension associated with diabetes ([place])   Normocytic anemia   Hyperglycemia due to diabetes mellitus ([place])   Thrombocytosis ([place])   Right BKA stump infection  Patient  presents to the emergency department due to infection of right BKA stump He was started on IV Vancomycin and cefepime, we shall continue with same at this time Patient's orthopedic surgeon at [facility] was already consulted and recommended admitting patient to [facility] with plan to follow-up with patient once patient arrives on campus by ED physician Continue Tylenol as needed for pain/fever Patient will be kept n.p.o. at this time in anticipation for possible surgical procedure today No additional imaging study of the extremity this history at this time per orthopedic recommendation as related by ED  physician Continue wound care Please contact Dr. Sharol Given once patient arrives at [facility]  Hyperglycemia secondary to type II diabetes mellitus Continue insulin sliding scale and hypoglycemia protocol  Thrombocytosis possibly reactive Platelets 480; stable Continue to monitor platelet level with morning labs  Essential hypertension (controlled) Continue home meds once patient resumes oral intake  Normocytic anemia  H/H 8.5/29.3; this has ranged within 7.1-7.9 within last month  COPD  Continue Singulair  DVT prophylaxis: SCDs  Code Status: Full code  Family Communication: Wife at bedside (all questions answered to satisfaction)  Disposition Plan:  Patient is from:                        home Anticipated DC to:                   SNF or family members home Anticipated DC date:               2-3 days Anticipated DC barriers:          Unstable to discharge at this time due to right BKA stump wound infection requiring surgical evaluation, management and recommendation   Consults called: Orthopedic surgeon (Dr. Sharol Given)   Admission status: Inpatient    Bernadette Hoit MD [facility]  If 7PM-7AM, please contact night-coverage [email or web address]  06/18/2020, 7:09 AM

## 2020-06-18 NOTE — ED Notes (Signed)
Smelled cigarette smoke in pts room. Initially denied it. Found butt in floor and tobacco in floor. Unable to locate cigarettes in room. Notified security and [facility]

## 2020-06-18 NOTE — Progress Notes (Signed)
Patient was supposed to be transferred to [facility] this morning for surgical procedure, so he was only placed on SCDs for DVT prophylaxis.  Since patient continues to be here at [facility] due to still awaiting bed/transfer to [facility], it would be reasonable to place patient on subcu heparin as DVT prophylaxis with plan to suspend the DVT chemoprophylaxis when patient arrives at [facility] and date/time for surgery is known.

## 2020-06-18 NOTE — Progress Notes (Signed)
PATIENT ON WAIT LIST

## 2020-06-18 NOTE — ED Notes (Signed)
Patient had accident using urinal. Spilt urine in bed.

## 2020-06-18 NOTE — Progress Notes (Signed)
69 year old gentleman who was admitted early morning due to right BKA stump infection.  Patient seen and examined.  Niece at the bedside.  Patient has no complaint.  He has foul smell at the right stump.  Stump is dressed.  I did not remove the dressing.  Patient has clear lungs, abdomen soft nontender.  He is alert and oriented.  He is awaiting to be transferred to [facility] in order to be seen by Dr. Sharol Given for further evaluation.  Continue current antibiotics.  He is hemodynamically stable.

## 2020-06-18 NOTE — Progress Notes (Addendum)
Pharmacy Antibiotic Note  Omar Todd is a 69 y.o. male admitted on 06/18/2020 with right BKA stump infection.  Pharmacy has been consulted for vancomycin and cefepime  dosing.    Plan: Start cefepime 2g IV q8h Give vancomycin 1g  IV x1 dose, then vancomycin 750mg  IV  q12h Goal vancomycin trough range: 10-15   mcg/mL Pharmacy will continue to monitor renal function, vancomycin troughs as clinically appropriate,  cultures and patient progress.     Height: 6\' 2"  (188 cm) Weight: 78 kg (172 lb) IBW/kg (Calculated) : 82.2  Temp (24hrs), Avg:98.2 F (36.8 C), Min:98.1 F (36.7 C), Max:98.2 F (36.8 C)  Recent Labs  Lab 06/18/20 0216 06/18/20 0225  WBC 9.0  --   CREATININE 0.71  --   LATICACIDVEN  --  1.9    Estimated Creatinine Clearance: 96.1 mL/min (by C-G formula based on SCr of 0.71 mg/dL).    Allergies  Allergen Reactions  . Penicillins Other (See Comments)    Pt. States he "passed out"    Antimicrobials this admission: cefepime 7/29 >>  vancomycin 7/29 >>    Microbiology results: 7/29 [facility] x2:    7/29 Resp PCR: SARS CoV-2 negative    Thank you for allowing pharmacy to be a part of this patient's care.  Despina Pole 06/18/2020 8:28 AM

## 2020-06-19 ENCOUNTER — Encounter (HOSPITAL_COMMUNITY): Payer: Self-pay | Admitting: Internal Medicine

## 2020-06-19 ENCOUNTER — Inpatient Hospital Stay (HOSPITAL_COMMUNITY): Payer: Medicare Other | Admitting: Anesthesiology

## 2020-06-19 ENCOUNTER — Ambulatory Visit (INDEPENDENT_AMBULATORY_CARE_PROVIDER_SITE_OTHER): Payer: Medicare Other

## 2020-06-19 ENCOUNTER — Encounter (HOSPITAL_COMMUNITY)
Admission: EM | Disposition: A | Discharge status: Home Health | Payer: Self-pay | Source: Home / Self Care | Attending: Internal Medicine

## 2020-06-19 DIAGNOSIS — I480 Paroxysmal atrial fibrillation: Secondary | ICD-10-CM

## 2020-06-19 DIAGNOSIS — T8743 Infection of amputation stump, right lower extremity: Principal | ICD-10-CM

## 2020-06-19 DIAGNOSIS — I350 Nonrheumatic aortic (valve) stenosis: Secondary | ICD-10-CM

## 2020-06-19 DIAGNOSIS — E1140 Type 2 diabetes mellitus with diabetic neuropathy, unspecified: Secondary | ICD-10-CM | POA: Diagnosis not present

## 2020-06-19 DIAGNOSIS — E1151 Type 2 diabetes mellitus with diabetic peripheral angiopathy without gangrene: Secondary | ICD-10-CM

## 2020-06-19 DIAGNOSIS — E43 Unspecified severe protein-calorie malnutrition: Secondary | ICD-10-CM

## 2020-06-19 DIAGNOSIS — J9611 Chronic respiratory failure with hypoxia: Secondary | ICD-10-CM

## 2020-06-19 DIAGNOSIS — E1165 Type 2 diabetes mellitus with hyperglycemia: Secondary | ICD-10-CM

## 2020-06-19 DIAGNOSIS — I714 Abdominal aortic aneurysm, without rupture: Secondary | ICD-10-CM

## 2020-06-19 DIAGNOSIS — L89302 Pressure ulcer of unspecified buttock, stage 2: Secondary | ICD-10-CM

## 2020-06-19 DIAGNOSIS — I6529 Occlusion and stenosis of unspecified carotid artery: Secondary | ICD-10-CM

## 2020-06-19 DIAGNOSIS — Z4781 Encounter for orthopedic aftercare following surgical amputation: Secondary | ICD-10-CM

## 2020-06-19 DIAGNOSIS — I119 Hypertensive heart disease without heart failure: Secondary | ICD-10-CM

## 2020-06-19 DIAGNOSIS — I70229 Atherosclerosis of native arteries of extremities with rest pain, unspecified extremity: Secondary | ICD-10-CM

## 2020-06-19 DIAGNOSIS — D519 Vitamin B12 deficiency anemia, unspecified: Secondary | ICD-10-CM

## 2020-06-19 DIAGNOSIS — K2950 Unspecified chronic gastritis without bleeding: Secondary | ICD-10-CM

## 2020-06-19 DIAGNOSIS — J449 Chronic obstructive pulmonary disease, unspecified: Secondary | ICD-10-CM

## 2020-06-19 DIAGNOSIS — L8990 Pressure ulcer of unspecified site, unspecified stage: Secondary | ICD-10-CM

## 2020-06-19 DIAGNOSIS — I2510 Atherosclerotic heart disease of native coronary artery without angina pectoris: Secondary | ICD-10-CM

## 2020-06-19 DIAGNOSIS — G3184 Mild cognitive impairment, so stated: Secondary | ICD-10-CM

## 2020-06-19 DIAGNOSIS — D509 Iron deficiency anemia, unspecified: Secondary | ICD-10-CM

## 2020-06-19 DIAGNOSIS — I723 Aneurysm of iliac artery: Secondary | ICD-10-CM

## 2020-06-19 HISTORY — PX: AMPUTATION: SHX166

## 2020-06-19 LAB — GLUCOSE, CAPILLARY
Glucose-Capillary: 195 mg/dL — ABNORMAL HIGH (ref 70–99)
Glucose-Capillary: 183 mg/dL — ABNORMAL HIGH (ref 70–99)
Glucose-Capillary: 138 mg/dL — ABNORMAL HIGH (ref 70–99)
Glucose-Capillary: 134 mg/dL — ABNORMAL HIGH (ref 70–99)
Glucose-Capillary: 153 mg/dL — ABNORMAL HIGH (ref 70–99)
Glucose-Capillary: 299 mg/dL — ABNORMAL HIGH (ref 70–99)

## 2020-06-19 SURGERY — AMPUTATION, ABOVE KNEE
Anesthesia: General | Site: Knee | Laterality: Right

## 2020-06-19 MED ORDER — DOCUSATE SODIUM 100 MG PO CAPS
100.0000 mg | ORAL_CAPSULE | Freq: Two times a day (BID) | ORAL | Status: DC
  Administered 2020-06-19 – 2020-06-22 (×6): 100 mg via ORAL
  Filled 2020-06-19 (×7): qty 1

## 2020-06-19 MED ORDER — LACTATED RINGERS IV SOLN: INTRAVENOUS | Status: DC | PRN

## 2020-06-19 MED ORDER — FENTANYL CITRATE (PF) 250 MCG/5ML IJ SOLN
INTRAMUSCULAR | Status: AC
  Filled 2020-06-19: qty 5

## 2020-06-19 MED ORDER — OXYCODONE HCL 5 MG PO TABS: 5.0000 mg | ORAL_TABLET | Freq: Once | ORAL | Status: DC | PRN

## 2020-06-19 MED ORDER — ONDANSETRON HCL 4 MG/2ML IJ SOLN
INTRAMUSCULAR | Status: AC
  Filled 2020-06-19: qty 2

## 2020-06-19 MED ORDER — FENTANYL CITRATE (PF) 100 MCG/2ML IJ SOLN: 50.0000 ug | Freq: Once | INTRAMUSCULAR | Status: AC

## 2020-06-19 MED ORDER — PHENYLEPHRINE 40 MCG/ML (10ML) SYRINGE FOR IV PUSH (FOR BLOOD PRESSURE SUPPORT)
PREFILLED_SYRINGE | INTRAVENOUS | Status: DC | PRN
  Administered 2020-06-19 (×2): 80 ug via INTRAVENOUS

## 2020-06-19 MED ORDER — METOCLOPRAMIDE HCL 5 MG PO TABS: 5.0000 mg | ORAL_TABLET | Freq: Three times a day (TID) | ORAL | Status: DC | PRN

## 2020-06-19 MED ORDER — EPHEDRINE SULFATE-NACL 50-0.9 MG/10ML-% IV SOSY
PREFILLED_SYRINGE | INTRAVENOUS | Status: DC | PRN
  Administered 2020-06-19: 10 mg via INTRAVENOUS

## 2020-06-19 MED ORDER — CHLORHEXIDINE GLUCONATE 0.12 % MT SOLN: 15.0000 mL | Freq: Once | OROMUCOSAL | Status: AC

## 2020-06-19 MED ORDER — METHOCARBAMOL 1000 MG/10ML IJ SOLN
500.0000 mg | Freq: Four times a day (QID) | INTRAVENOUS | Status: DC | PRN
  Filled 2020-06-19: qty 5

## 2020-06-19 MED ORDER — SODIUM CHLORIDE 0.9 % IV SOLN: INTRAVENOUS | Status: DC

## 2020-06-19 MED ORDER — OXYCODONE HCL 5 MG PO TABS: 5.0000 mg | ORAL_TABLET | ORAL | Status: DC | PRN

## 2020-06-19 MED ORDER — MIDAZOLAM HCL 2 MG/2ML IJ SOLN: 2.0000 mg | Freq: Once | INTRAMUSCULAR | Status: AC

## 2020-06-19 MED ORDER — ONDANSETRON HCL 4 MG/2ML IJ SOLN
INTRAMUSCULAR | Status: DC | PRN
  Administered 2020-06-19: 4 mg via INTRAVENOUS

## 2020-06-19 MED ORDER — PROPOFOL 10 MG/ML IV BOLUS
INTRAVENOUS | Status: DC | PRN
  Administered 2020-06-19: 100 mg via INTRAVENOUS

## 2020-06-19 MED ORDER — ORAL CARE MOUTH RINSE: 15.0000 mL | Freq: Once | OROMUCOSAL | Status: AC

## 2020-06-19 MED ORDER — ONDANSETRON HCL 4 MG PO TABS: 4.0000 mg | ORAL_TABLET | Freq: Four times a day (QID) | ORAL | Status: DC | PRN

## 2020-06-19 MED ORDER — FENTANYL CITRATE (PF) 100 MCG/2ML IJ SOLN: 25.0000 ug | INTRAMUSCULAR | Status: DC | PRN

## 2020-06-19 MED ORDER — MIDAZOLAM HCL 2 MG/2ML IJ SOLN
INTRAMUSCULAR | Status: AC
  Filled 2020-06-19: qty 2

## 2020-06-19 MED ORDER — CLONIDINE HCL (ANALGESIA) 100 MCG/ML EP SOLN
EPIDURAL | Status: DC | PRN
  Administered 2020-06-19: 100 ug

## 2020-06-19 MED ORDER — CLINDAMYCIN PHOSPHATE 900 MG/50ML IV SOLN
INTRAVENOUS | Status: AC
  Filled 2020-06-19: qty 50

## 2020-06-19 MED ORDER — ONDANSETRON HCL 4 MG/2ML IJ SOLN: 4.0000 mg | Freq: Four times a day (QID) | INTRAMUSCULAR | Status: DC | PRN

## 2020-06-19 MED ORDER — GABAPENTIN 300 MG PO CAPS
300.0000 mg | ORAL_CAPSULE | Freq: Three times a day (TID) | ORAL | Status: DC
  Administered 2020-06-19 – 2020-06-22 (×9): 300 mg via ORAL
  Filled 2020-06-19 (×9): qty 1

## 2020-06-19 MED ORDER — 0.9 % SODIUM CHLORIDE (POUR BTL) OPTIME
TOPICAL | Status: DC | PRN
  Administered 2020-06-19: 1000 mL

## 2020-06-19 MED ORDER — CHLORHEXIDINE GLUCONATE 0.12 % MT SOLN
OROMUCOSAL | Status: AC
  Administered 2020-06-19: 15 mL via OROMUCOSAL
  Filled 2020-06-19: qty 15

## 2020-06-19 MED ORDER — LACTATED RINGERS IV SOLN: INTRAVENOUS | Status: DC

## 2020-06-19 MED ORDER — PHENYLEPHRINE 40 MCG/ML (10ML) SYRINGE FOR IV PUSH (FOR BLOOD PRESSURE SUPPORT)
PREFILLED_SYRINGE | INTRAVENOUS | Status: AC
  Filled 2020-06-19: qty 10

## 2020-06-19 MED ORDER — PROPOFOL 10 MG/ML IV BOLUS
INTRAVENOUS | Status: AC
  Filled 2020-06-19: qty 20

## 2020-06-19 MED ORDER — HYDROMORPHONE HCL 1 MG/ML IJ SOLN: 0.5000 mg | INTRAMUSCULAR | Status: DC | PRN

## 2020-06-19 MED ORDER — COLLAGENASE 250 UNIT/GM EX OINT
TOPICAL_OINTMENT | Freq: Every day | CUTANEOUS | Status: DC
  Filled 2020-06-19 (×2): qty 30

## 2020-06-19 MED ORDER — MIDAZOLAM HCL 2 MG/2ML IJ SOLN
INTRAMUSCULAR | Status: AC
  Administered 2020-06-19: 2 mg via INTRAVENOUS
  Filled 2020-06-19: qty 2

## 2020-06-19 MED ORDER — ONDANSETRON HCL 4 MG/2ML IJ SOLN: 4.0000 mg | Freq: Once | INTRAMUSCULAR | Status: DC | PRN

## 2020-06-19 MED ORDER — METOCLOPRAMIDE HCL 5 MG/ML IJ SOLN: 5.0000 mg | Freq: Three times a day (TID) | INTRAMUSCULAR | Status: DC | PRN

## 2020-06-19 MED ORDER — CLINDAMYCIN PHOSPHATE 900 MG/50ML IV SOLN
900.0000 mg | INTRAVENOUS | Status: AC
  Administered 2020-06-19: 900 mg via INTRAVENOUS

## 2020-06-19 MED ORDER — ROPIVACAINE HCL 5 MG/ML IJ SOLN
INTRAMUSCULAR | Status: DC | PRN
Start: 2020-06-19 — End: 2020-06-19
  Administered 2020-06-19: 30 mL via PERINEURAL

## 2020-06-19 MED ORDER — LIDOCAINE HCL (CARDIAC) PF 100 MG/5ML IV SOSY
PREFILLED_SYRINGE | INTRAVENOUS | Status: DC | PRN
  Administered 2020-06-19: 50 mg via INTRAVENOUS

## 2020-06-19 MED ORDER — FENTANYL CITRATE (PF) 100 MCG/2ML IJ SOLN
INTRAMUSCULAR | Status: AC
  Administered 2020-06-19: 50 ug via INTRAVENOUS
  Filled 2020-06-19: qty 2

## 2020-06-19 MED ORDER — LIDOCAINE 2% (20 MG/ML) 5 ML SYRINGE
INTRAMUSCULAR | Status: AC
  Filled 2020-06-19: qty 5

## 2020-06-19 MED ORDER — METHOCARBAMOL 500 MG PO TABS: 500.0000 mg | ORAL_TABLET | Freq: Four times a day (QID) | ORAL | Status: DC | PRN

## 2020-06-19 MED ORDER — OXYCODONE HCL 5 MG/5ML PO SOLN: 5.0000 mg | Freq: Once | ORAL | Status: DC | PRN

## 2020-06-19 SURGICAL SUPPLY — 39 items
BLADE SAW RECIP 87.9 MT (BLADE) ×3 IMPLANT
BLADE SURG 21 STRL SS (BLADE) ×3 IMPLANT
BNDG COHESIVE 6X5 TAN STRL LF (GAUZE/BANDAGES/DRESSINGS) ×3 IMPLANT
CANISTER WOUND CARE 500ML ATS (WOUND CARE) IMPLANT
COVER SURGICAL LIGHT HANDLE (MISCELLANEOUS) ×3 IMPLANT
COVER WAND RF STERILE (DRAPES) IMPLANT
DRAPE INCISE IOBAN 66X45 STRL (DRAPES) ×6 IMPLANT
DRAPE U-SHAPE 47X51 STRL (DRAPES) ×3 IMPLANT
DRESSING PREVENA PLUS CUSTOM (GAUZE/BANDAGES/DRESSINGS) ×1 IMPLANT
DRSG PREVENA PLUS CUSTOM (GAUZE/BANDAGES/DRESSINGS) ×3
DURAPREP 26ML APPLICATOR (WOUND CARE) ×3 IMPLANT
ELECT REM PT RETURN 9FT ADLT (ELECTROSURGICAL) ×3
ELECTRODE REM PT RTRN 9FT ADLT (ELECTROSURGICAL) ×1 IMPLANT
GLOVE BIOGEL PI IND STRL 7.5 (GLOVE) ×1 IMPLANT
GLOVE BIOGEL PI IND STRL 9 (GLOVE) ×1 IMPLANT
GLOVE BIOGEL PI INDICATOR 7.5 (GLOVE) ×2
GLOVE BIOGEL PI INDICATOR 9 (GLOVE) ×2
GLOVE SURG ORTHO 9.0 STRL STRW (GLOVE) ×3 IMPLANT
GLOVE SURG SS PI 6.5 STRL IVOR (GLOVE) ×3 IMPLANT
GOWN STRL REUS W/ TWL LRG LVL3 (GOWN DISPOSABLE) ×1 IMPLANT
GOWN STRL REUS W/ TWL XL LVL3 (GOWN DISPOSABLE) ×2 IMPLANT
GOWN STRL REUS W/TWL LRG LVL3 (GOWN DISPOSABLE) ×2
GOWN STRL REUS W/TWL XL LVL3 (GOWN DISPOSABLE) ×6
KIT BASIN OR (CUSTOM PROCEDURE TRAY) ×3 IMPLANT
KIT TURNOVER KIT B (KITS) ×3 IMPLANT
MANIFOLD NEPTUNE II (INSTRUMENTS) ×3 IMPLANT
NS IRRIG 1000ML POUR BTL (IV SOLUTION) ×3 IMPLANT
PACK ORTHO EXTREMITY (CUSTOM PROCEDURE TRAY) ×3 IMPLANT
PAD ARMBOARD 7.5X6 YLW CONV (MISCELLANEOUS) ×3 IMPLANT
PREVENA RESTOR ARTHOFORM 46X30 (CANNISTER) ×3 IMPLANT
STAPLER VISISTAT 35W (STAPLE) IMPLANT
STOCKINETTE IMPERVIOUS LG (DRAPES) ×3 IMPLANT
SUT ETHILON 2 0 PSLX (SUTURE) ×6 IMPLANT
SUT SILK 2 0 (SUTURE) ×3
SUT SILK 2-0 18XBRD TIE 12 (SUTURE) ×1 IMPLANT
TOWEL GREEN STERILE FF (TOWEL DISPOSABLE) ×3 IMPLANT
TUBE CONNECTING 20'X1/4 (TUBING) ×1
TUBE CONNECTING 20X1/4 (TUBING) ×2 IMPLANT
YANKAUER SUCT BULB TIP NO VENT (SUCTIONS) ×3 IMPLANT

## 2020-06-19 NOTE — Consult Note (Signed)
WOC Nurse Consult Note: Patient receiving care in [facility] 5M11. 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 Left buttock is stage 2 PI. Pressure Injury POA: Yes Measurement: Right buttock measures 4.2 cm x 4 cm x 0.6 cm.  It is nearly 100% pink granulation tissue--very small amount of yellow slough remains.  The left buttock has a barely discernible stage 2 PI that measures 0.7 cm x 0.2 cm and no depth. Wound bed: Drainage (amount, consistency, odor)  Periwound: intact bilaterally Dressing procedure/placement/frequency: narrow foam dressing to left buttock daily. Santyl and saline gauze with foam topper daily for right buttock.  I have also added a mattress with low air loss feature for pressure relief. Monitor the wound area(s) for worsening of condition such as: Signs/symptoms of infection,  Increase in size,  Development of or worsening of odor, Development of pain, or increased pain at the affected locations.  Notify the medical team if any of these develop.  Thank you for the consult.  Discussed plan of care with the patient and bedside nurse.  [place] nurse will not follow at this time.  Please re-consult the [facility] [facility] team if needed.  Val Riles, RN, MSN, CWOCN, CNS-BC, pager [phone]

## 2020-06-19 NOTE — Progress Notes (Signed)
New Admission Note: ? Arrival Method: Stretcher Mental Orientation: Alert and Oriented x 2 Telemetry: No Assessment: Completed Skin: Refer to flowsheet IV: Right Forearm  Pain: 0/10 Tubes: None Safety Measures: Safety Fall Prevention Plan discussed with patient. Admission: Completed [facility] [place] Orientation: Patient has been orientated to the room, unit and the staff. Family: Niece at the bedside due to patient's confusion. Orders have been reviewed and are being implemented. Will continue to monitor the patient. Call light has been placed within reach and bed alarm has been activated.  ? Milagros Loll, RN  Phone Number: [phone]

## 2020-06-19 NOTE — Anesthesia Procedure Notes (Signed)
Anesthesia Regional Block: Femoral nerve block   Pre-Anesthetic Checklist: ,, timeout performed, Correct Patient, Correct Site, Correct Laterality, Correct Procedure, Correct Position, site marked, Risks and benefits discussed,  Surgical consent,  Pre-op evaluation,  At surgeon's request and post-op pain management  Laterality: Right  Prep: chloraprep       Needles:  Injection technique: Single-shot  Needle Type: Echogenic Stimulator Needle     Needle Length: 9cm  Needle Gauge: 21   Needle insertion depth: 6 cm   Additional Needles:   Procedures:,,,, ultrasound used (permanent image in chart),,,,  Narrative:  Start time: 06/19/2020 1:25 PM End time: 06/19/2020 1:30 PM Injection made incrementally with aspirations every 5 mL.  Performed by: Personally  Anesthesiologist: Josephine Igo, MD  Additional Notes: Timeout performed. Patient sedated. Relevant anatomy ID'd using [place]. Incremental 2-79ml injection of LA with frequent aspiration. Patient tolerated procedure well.        Right Femoral Nerve Block

## 2020-06-19 NOTE — Anesthesia Postprocedure Evaluation (Signed)
Anesthesia Post Note  Patient: Omar Todd  Procedure(s) Performed: RIGHT ABOVE KNEE AMPUTATION (Right Knee)     Patient location during evaluation: PACU Anesthesia Type: General Level of consciousness: awake and alert and oriented Pain management: pain level controlled Vital Signs Assessment: post-procedure vital signs reviewed and stable Respiratory status: spontaneous breathing, nonlabored ventilation and respiratory function stable Cardiovascular status: blood pressure returned to baseline and stable Postop Assessment: no apparent nausea or vomiting Anesthetic complications: no   No complications documented.  Last Vitals:  Vitals:   06/19/20 1345 06/19/20 1500  BP: (!) 142/66 (!) 121/51  Pulse: 75 77  Resp: 17 20  Temp:  36.8 C  SpO2: 100% 100%    Last Pain:  Vitals:   06/19/20 1500  TempSrc:   PainSc: Asleep                 Tressa Maldonado A.

## 2020-06-19 NOTE — Plan of Care (Signed)
  Problem: Education: Goal: Knowledge of General Education information will improve Description Including pain rating scale, medication(s)/side effects and non-pharmacologic comfort measures Outcome: Progressing   

## 2020-06-19 NOTE — Anesthesia Procedure Notes (Addendum)
Procedure Name: LMA Insertion Date/Time: 06/19/2020 2:25 PM Performed by: Michele Rockers, CRNA Pre-anesthesia Checklist: Patient identified, Emergency Drugs available, Suction available and Patient being monitored Patient Re-evaluated:Patient Re-evaluated prior to induction Oxygen Delivery Method: Circle system utilized Preoxygenation: Pre-oxygenation with 100% oxygen Induction Type: IV induction Ventilation: Mask ventilation without difficulty LMA: LMA inserted LMA Size: 5.0 Number of attempts: 1 Airway Equipment and Method: Oral airway Placement Confirmation: positive ETCO2 and breath sounds checked- equal and bilateral Tube secured with: Tape Dental Injury: Teeth and Oropharynx as per pre-operative assessment

## 2020-06-19 NOTE — Transfer of Care (Signed)
Immediate Anesthesia Transfer of Care Note  Patient: TELLY JAWAD  Procedure(s) Performed: RIGHT ABOVE KNEE AMPUTATION (Right Knee)  Patient Location: PACU  Anesthesia Type:General  Level of Consciousness: drowsy  Airway & Oxygen Therapy: Patient Spontanous Breathing and Patient connected to nasal cannula oxygen  Post-op Assessment: Report given to RN and Post -op Vital signs reviewed and stable  Post vital signs: Reviewed and stable  Last Vitals:  Vitals Value Taken Time  BP    Temp    Pulse 74 06/19/20 1503  Resp 24 06/19/20 1503  SpO2 100 % 06/19/20 1503  Vitals shown include unvalidated device data.  Last Pain:  Vitals:   06/19/20 0945  TempSrc:   PainSc: 0-No pain         Complications: No complications documented.

## 2020-06-19 NOTE — Anesthesia Preprocedure Evaluation (Addendum)
Anesthesia Evaluation  Patient identified by MRN, date of birth, ID band Patient awake    Reviewed: Allergy & Precautions, NPO status , Patient's Chart, lab work & pertinent test results, reviewed documented beta blocker date and time   History of Anesthesia Complications Negative for: history of anesthetic complications  Airway Mallampati: II  TM Distance: >3 FB Neck ROM: Full    Dental  (+) Edentulous Upper, Edentulous Lower   Pulmonary COPD, Current Smoker and Patient abstained from smoking.,    Pulmonary exam normal breath sounds clear to auscultation       Cardiovascular hypertension, Pt. on medications and Pt. on home beta blockers + Peripheral Vascular Disease  Normal cardiovascular exam+ Valvular Problems/Murmurs AS  Rhythm:Regular Rate:Normal   '21 Carotid US - 60-79% ICAS b/l  '21 TTE - EF 50 to 55%. Left ventricular endocardial  border not optimally defined to evaluate regional wall motion. There is mild left ventricular hypertrophy.Trivial MR. The aortic valve has an indeterminant number of cusps. Mild AI. Mild to moderate AS.    Neuro/Psych  Neuromuscular disease (neuropathy) negative psych ROS   GI/Hepatic Neg liver ROS, GERD  Medicated,  Endo/Other  diabetes, Type 2, Oral Hypoglycemic Agents  Renal/GU negative Renal ROS     Musculoskeletal Open wound Right AKA   Abdominal   Peds  Hematology  (+) anemia ,   Anesthesia Other Findings Covid test negative   Reproductive/Obstetrics                           Anesthesia Physical Anesthesia Plan  ASA: III  Anesthesia Plan: General   Post-op Pain Management:  Regional for Post-op pain   Induction: Intravenous  PONV Risk Score and Plan: 2 and Treatment may vary due to age or medical condition, Ondansetron and Dexamethasone  Airway Management Planned: LMA  Additional Equipment: None  Intra-op Plan:   Post-operative Plan:  Extubation in OR  Informed Consent: I have reviewed the patients History and Physical, chart, labs and discussed the procedure including the risks, benefits and alternatives for the proposed anesthesia with the patient or authorized representative who has indicated his/her understanding and acceptance.     Dental advisory given  Plan Discussed with: CRNA and Anesthesiologist  Anesthesia Plan Comments:        Anesthesia Quick Evaluation                                  Anesthesia Evaluation  Patient identified by MRN, date of birth, ID band Patient awake    Reviewed: Allergy & Precautions, NPO status , Patient's Chart, lab work & pertinent test results  Airway Mallampati: II  TM Distance: >3 FB Neck ROM: Full    Dental   Pulmonary COPD, Current Smoker,    breath sounds clear to auscultation       Cardiovascular hypertension, Pt. on medications and Pt. on home beta blockers + Peripheral Vascular Disease   Rhythm:Regular Rate:Normal     Neuro/Psych negative neurological ROS     GI/Hepatic Neg liver ROS, GERD  ,  Endo/Other  diabetes, Poorly Controlled, Type 2, Oral Hypoglycemic Agents  Renal/GU negative Renal ROS     Musculoskeletal   Abdominal   Peds  Hematology  (+) anemia ,   Anesthesia Other Findings   Reproductive/Obstetrics  Anesthesia Physical Anesthesia Plan  ASA: III  Anesthesia Plan: General   Post-op Pain Management:    Induction: Intravenous  PONV Risk Score and Plan: 1 and Dexamethasone, Ondansetron and Treatment may vary due to age or medical condition  Airway Management Planned: LMA  Additional Equipment:   Intra-op Plan:   Post-operative Plan: Extubation in OR  Informed Consent: I have reviewed the patients History and Physical, chart, labs and discussed the procedure including the risks, benefits and alternatives for the proposed anesthesia with the patient  or authorized representative who has indicated his/her understanding and acceptance.     Dental advisory given  Plan Discussed with: CRNA  Anesthesia Plan Comments:         Anesthesia Quick Evaluation

## 2020-06-19 NOTE — Op Note (Signed)
06/18/2020 - 06/19/2020  2:53 PM  PATIENT:  Omar Todd    PRE-OPERATIVE DIAGNOSIS:  RIGHT BELOW KNEE AMPUTATION WOUND dehiscence  POST-OPERATIVE DIAGNOSIS:  Same  PROCEDURE:  RIGHT ABOVE KNEE AMPUTATION Application VAC  SURGEON:  Newt Minion, MD  PHYSICIAN ASSISTANT:None ANESTHESIA:   General  PREOPERATIVE INDICATIONS:  Omar Todd is a  69 y.o. male with a diagnosis of [place] who failed conservative measures and elected for surgical management.    The risks benefits and alternatives were discussed with the patient preoperatively including but not limited to the risks of infection, bleeding, nerve injury, cardiopulmonary complications, the need for revision surgery, among others, and the patient was willing to proceed.  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  Patient had extensive calcification of his femoral vessels  OPERATIVE PROCEDURE: Patient was brought the operating room and underwent a general anesthetic.  After adequate levels anesthesia were obtained patient's right lower extremity was prepped using DuraPrep draped into a sterile field a timeout was called.  The wound dehiscence was wrapped out of sterile field with impervious stockinette.  A fishmouth incision was made just proximal to the patella.  This was carried down through muscle the vascular bundle medially was clamped and tied with 2-0 silk.  The femur was resected with a reciprocating saw.  Electrocautery was used for further hemostasis.  The deep and superficial fascia and skin was closed using 2-0 nylon the skin was closed using staples a customizable and Praveena Arthur form wound VAC dressing was applied this had a good suction fit patient was extubated taken the  PACU in stable condition.   DISCHARGE PLANNING:  Antibiotic duration: 24-hour antibiotics  Weightbearing: Nonweightbearing on the right  Pain medication: Opioid pathway  Dressing care/ Wound VAC: Continue wound VAC for 1 week  Ambulatory devices: Walker  Discharge to: Discharge planning based on therapy recommendations  Follow-up: In the office 1 week post operative.
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
## 2020-06-19 NOTE — Progress Notes (Addendum)
PROGRESS NOTE  DAEGON DEISS UMP:[id number] DOB: July 20, 1951 DOA: 06/18/2020 PCP: [name], DO   LOS: 1 day   Brief narrative: As per HPI,  Omar Todd is a 69 y.o. male with medical history significant for hypertension, hyperlipidemia, COPD, AAA and recent BKA on June 27 by Dr. Sharol Given in [place] who presented  to the emergency department due to several days of drainage, bleeding and wound infection to the right stump.  Wife was at bedside and provided some of the history.  Patient saw the orthopedic surgeon (Dr. Sharol Given) about 5 days ago, at that time, patient had mild drainage from the wound which was considered to be normal per wife at bedside, he was prescribed with an antibiotic (patient cannot remember the name of the antibiotic) and he stated that he has been compliant with the antibiotics.  Patient also had a follow-up with his PCP 2 days ago and the wound was said to be healing well.  Wife states that the wound opened up more for one day and there was bleeding, drainage and foul-smelling discharge, he contacted his nurse online who asked him to go to the ED for further evaluation and management.  In the emergency department, he was hemodynamically stable.  Work-up in the ED showed normocytic anemia, thrombocytosis and hyperglycemia.  SARS coronavirus was negative.  Right knee x-ray showed postsurgical change without acute abnormality.  Patient was started on IV vancomycin and cefepime.  IV hydration was provided.  Orthopedic surgeon (Dr. Sharol Given) was consulted per ED physician and recommended admitting patient to [facility] with plan to follow-up with patient once he arrives at the campus.  Hospitalist was asked to admit patient for further evaluation and management.   Assessment/Plan:  Principal Problem:   Right BKA infection ([place]) Active Problems:   Hyperlipidemia associated with type 2 diabetes mellitus ([place])   Hypertension associated with diabetes ([place])   Normocytic  anemia   Pressure injury of skin   Hyperglycemia due to diabetes mellitus (HCC)   Thrombocytosis (HCC)  Right BKA stump infection   on IV Vancomycin and cefepime,  Dr Sharol Given for potential above-knee amputation today.  Hyperglycemia secondary to type II diabetes mellitus Continue insulin sliding scale and hypoglycemia protocol.  Last POC glucose of 195.  Thrombocytosis possibly reactive Platelets 480;  monitor CBC.  Essential hypertension Resume oral meds when PO ok.  Normocytic anemia  with in baseline.  COPD  Continue Singulair, nebulizers  Bilateral buttocks stage II pressure ulceration present on admission.  Continue wound care.  DVT prophylaxis: heparin injection 5,000 Units Start: 06/19/20 0000 SCDs Start: 06/18/20 0517   Code Status: Full code  Family Communication: I spoke with the patient's and niece at bedside  Status is: Inpatient  Remains inpatient appropriate because:IV treatments appropriate due to intensity of illness or inability to take PO and Inpatient level of care appropriate due to severity of illness, possible Above knee amputation   Dispo: The patient is from: Home              Anticipated d/c is to: Home with home health/skilled nursing facility              Anticipated d/c date is: 2 days              Patient currently is not medically stable to d/c.  Consultants:  Orthopedics Dr. Sharol Given  Procedures:  None yet  Antibiotics:  . Vancomycin and cefepime  Anti-infectives (From admission, onward)   Start  Dose/Rate Route Frequency Ordered Stop   06/18/20 1000  ceFEPIme (MAXIPIME) 2 g in sodium chloride 0.9 % 100 mL IVPB     Discontinue     2 g 200 mL/hr over 30 Minutes Intravenous Every 12 hours 06/18/20 0712     06/18/20 0900  vancomycin (VANCOREADY) IVPB 750 mg/150 mL     Discontinue     750 mg 150 mL/hr over 60 Minutes Intravenous Every 12 hours 06/18/20 0814     06/18/20 0200  ceFEPIme (MAXIPIME) 2 g in sodium chloride 0.9 %  100 mL IVPB        2 g 200 mL/hr over 30 Minutes Intravenous  Once 06/18/20 0148 06/18/20 0505   06/18/20 0145  vancomycin (VANCOCIN) IVPB 1000 mg/200 mL premix        1,000 mg 200 mL/hr over 60 Minutes Intravenous  Once 06/18/20 0144 06/18/20 0505     Subjective: Today, patient was seen and examined at bedside.  Denies shortness of breath, cough, fever or overt pain.  Objective: Vitals:   06/19/20 0236 06/19/20 0645  BP: (!) 149/68 (!) 146/60  Pulse: 79 79  Resp: 17 16  Temp: 98.3 F (36.8 C) 97.9 F (36.6 C)  SpO2: 99% 93%    Intake/Output Summary (Last 24 hours) at 06/19/2020 0745 Last data filed at 06/19/2020 0449 Gross per 24 hour  Intake 529.64 ml  Output 1450 ml  Net -920.36 ml   Filed Weights   06/17/20 2124  Weight: 78 kg   Body mass index is 22.08 kg/m.   Physical Exam: GENERAL: Patient is alert awake and oriented. Not in obvious distress. HENT: Mild pallor noted.. Pupils equally reactive to light. Oral mucosa is moist NECK: is supple, no gross swelling noted. CHEST: Clear to auscultation. No crackles or wheezes.  Diminished breath sounds bilaterally. CVS: S1 and S2 heard, no murmur. Regular rate and rhythm.  ABDOMEN: Soft, non-tender, bowel sounds are present. EXTREMITIES: Right below-knee amputation the stump covered with dressing. Flexion deformity of the hip. CNS: Cranial nerves are intact. No focal motor deficits. SKIN: Right below-knee amputation with stump covered in dressing.   Data Review: I have personally reviewed the following laboratory data and studies,  CBC: Recent Labs  Lab 06/18/20 0216  WBC 9.0  NEUTROABS 6.5  HGB 8.5*  HCT 29.3*  MCV 80.3  PLT 960*   Basic Metabolic Panel: Recent Labs  Lab 06/18/20 0216  NA 138  K 3.9  CL 102  CO2 27  GLUCOSE 255*  BUN 22  CREATININE 0.71  CALCIUM 9.1   Liver Function Tests: No results for input(s): AST, ALT, ALKPHOS, BILITOT, PROT, ALBUMIN in the last 168 hours. No results for  input(s): LIPASE, AMYLASE in the last 168 hours. No results for input(s): AMMONIA in the last 168 hours. Cardiac Enzymes: No results for input(s): CKTOTAL, CKMB, CKMBINDEX, TROPONINI in the last 168 hours. BNP (last 3 results) No results for input(s): BNP in the last 8760 hours.  ProBNP (last 3 results) No results for input(s): PROBNP in the last 8760 hours.  CBG: Recent Labs  Lab 06/18/20 1221 06/18/20 1653 06/18/20 2135 06/18/20 2237 06/19/20 0645  GLUCAP 198* 298* 167* 187* 195*   Recent Results (from the past 240 hour(s))  SARS Coronavirus 2 by RT PCR (hospital order, performed in [facility] hospital lab) Nasopharyngeal Nasopharyngeal Swab     Status: None   Collection Time: 06/18/20  2:15 AM   Specimen: Nasopharyngeal Swab  Result Value Ref Range  Status   SARS Coronavirus 2 NEGATIVE NEGATIVE Final    Comment: (NOTE) SARS-CoV-2 target nucleic acids are NOT DETECTED.  The SARS-CoV-2 RNA is generally detectable in upper and lower respiratory specimens during the acute phase of infection. The lowest concentration of SARS-CoV-2 viral copies this assay can detect is 250 copies / mL. A negative result does not preclude SARS-CoV-2 infection and should not be used as the sole basis for treatment or other patient management decisions.  A negative result may occur with improper specimen collection / handling, submission of specimen other than nasopharyngeal swab, presence of viral mutation(s) within the areas targeted by this assay, and inadequate number of viral copies (<250 copies / mL). A negative result must be combined with clinical observations, patient history, and epidemiological information.  Fact Sheet for Patients:   StrictlyIdeas.no  Fact Sheet for Healthcare Providers: BankingDealers.co.za  This test is not yet approved or  cleared by the [place] FDA and has been authorized for detection and/or diagnosis of  SARS-CoV-2 by FDA under an Emergency Use Authorization (EUA).  This EUA will remain in effect (meaning this test can be used) for the duration of the COVID-19 declaration under Section 564(b)(1) of the Act, 21 U.S.C. section 360bbb-3(b)(1), unless the authorization is terminated or revoked sooner.  Performed at [facility], [address], [place], [place] [address]   Blood culture (routine x 2)     Status: None (Preliminary result)   Collection Time: 06/18/20  2:18 AM   Specimen: BLOOD RIGHT FOREARM  Result Value Ref Range Status   Specimen Description BLOOD RIGHT FOREARM  Final   Special Requests   Final    BOTTLES DRAWN AEROBIC AND ANAEROBIC Blood Culture adequate volume   Culture   Final    NO GROWTH < 12 HOURS Performed at [facility], [address], [place], [place] [address]    Report Status PENDING  Incomplete  Blood culture (routine x 2)     Status: None (Preliminary result)   Collection Time: 06/18/20  2:26 AM   Specimen: Left Antecubital; Blood  Result Value Ref Range Status   Specimen Description LEFT ANTECUBITAL  Final   Special Requests   Final    BOTTLES DRAWN AEROBIC AND ANAEROBIC Blood Culture adequate volume   Culture   Final    NO GROWTH < 12 HOURS Performed at [facility], [address] [place], [place] [address]    Report Status PENDING  Incomplete     Studies: DG Knee 1-2 Views Right  Result Date: 06/18/2020 CLINICAL DATA:  History of recent BKA with drainage from the surgical site EXAM: RIGHT KNEE - 1-2 VIEW COMPARISON:  05/16/2019 FINDINGS: Changes of recent below the knee amputation are seen. Multiple surgical staples are noted. No open wound is seen. No bony erosive changes are noted. IMPRESSION: Postsurgical change without acute abnormality. Electronically Signed   By: Inez Catalina M.D.   On: 06/18/2020 03:04      Flora Lipps, MD  [facility] 06/19/2020

## 2020-06-19 NOTE — Progress Notes (Signed)
Patient's niece [name] took patient belongings with her to OR waiting.  Belongins included clothes, watch, and 2 silver colored rings. Berneta Levins, RN 06/20/2020

## 2020-06-20 ENCOUNTER — Encounter (HOSPITAL_COMMUNITY): Payer: Self-pay | Admitting: Orthopedic Surgery

## 2020-06-20 LAB — GLUCOSE, CAPILLARY
Glucose-Capillary: 302 mg/dL — ABNORMAL HIGH (ref 70–99)
Glucose-Capillary: 261 mg/dL — ABNORMAL HIGH (ref 70–99)
Glucose-Capillary: 202 mg/dL — ABNORMAL HIGH (ref 70–99)
Glucose-Capillary: 159 mg/dL — ABNORMAL HIGH (ref 70–99)

## 2020-06-20 LAB — COMPREHENSIVE METABOLIC PANEL
ALT: 10 U/L (ref 0–44)
AST: 9 U/L — ABNORMAL LOW (ref 15–41)
Albumin: 2.1 g/dL — ABNORMAL LOW (ref 3.5–5.0)
Alkaline Phosphatase: 67 U/L (ref 38–126)
Anion gap: 6 (ref 5–15)
BUN: 10 mg/dL (ref 8–23)
CO2: 27 mmol/L (ref 22–32)
Calcium: 8.5 mg/dL — ABNORMAL LOW (ref 8.9–10.3)
Chloride: 104 mmol/L (ref 98–111)
Creatinine, Ser: 0.7 mg/dL (ref 0.61–1.24)
GFR calc Af Amer: >60 mL/min (ref >60)
GFR calc non Af Amer: >60 mL/min (ref >60)
Glucose, Bld: 192 mg/dL — ABNORMAL HIGH (ref 70–99)
Potassium: 4.3 mmol/L (ref 3.5–5.1)
Sodium: 137 mmol/L (ref 135–145)
Total Bilirubin: 0.4 mg/dL (ref 0.3–1.2)
Total Protein: 5.6 g/dL — ABNORMAL LOW (ref 6.5–8.1)

## 2020-06-20 LAB — CBC
HCT: 24.9 % — ABNORMAL LOW (ref 39.0–52.0)
Hemoglobin: 7.1 g/dL — ABNORMAL LOW (ref 13.0–17.0)
MCH: 22.9 pg — ABNORMAL LOW (ref 26.0–34.0)
MCHC: 28.5 g/dL — ABNORMAL LOW (ref 30.0–36.0)
MCV: 80.3 fL (ref 80.0–100.0)
Platelets: 385 10*3/uL (ref 150–400)
RBC: 3.1 MIL/uL — ABNORMAL LOW (ref 4.22–5.81)
RDW: 18.9 % — ABNORMAL HIGH (ref 11.5–15.5)
WBC: 10.1 10*3/uL (ref 4.0–10.5)
nRBC: 0 % (ref 0.0–0.2)

## 2020-06-20 LAB — PHOSPHORUS: Phosphorus: 3.4 mg/dL (ref 2.5–4.6)

## 2020-06-20 LAB — MAGNESIUM: Magnesium: 1.9 mg/dL (ref 1.7–2.4)

## 2020-06-20 MED ORDER — PRAVASTATIN SODIUM 40 MG PO TABS
40.0000 mg | ORAL_TABLET | Freq: Every evening | ORAL | Status: DC
  Administered 2020-06-20 – 2020-06-21 (×2): 40 mg via ORAL
  Filled 2020-06-20 (×2): qty 1

## 2020-06-20 MED ORDER — ESCITALOPRAM OXALATE 10 MG PO TABS
10.0000 mg | ORAL_TABLET | Freq: Every day | ORAL | Status: DC
  Administered 2020-06-20 – 2020-06-22 (×3): 10 mg via ORAL
  Filled 2020-06-20 (×3): qty 1

## 2020-06-20 MED ORDER — PANTOPRAZOLE SODIUM 40 MG PO TBEC
40.0000 mg | DELAYED_RELEASE_TABLET | Freq: Every day | ORAL | Status: DC
  Administered 2020-06-20 – 2020-06-22 (×3): 40 mg via ORAL
  Filled 2020-06-20 (×3): qty 1

## 2020-06-20 MED ORDER — FERROUS SULFATE 325 (65 FE) MG PO TABS
325.0000 mg | ORAL_TABLET | Freq: Every day | ORAL | Status: DC
  Administered 2020-06-20 – 2020-06-22 (×3): 325 mg via ORAL
  Filled 2020-06-20 (×3): qty 1

## 2020-06-20 MED ORDER — LISINOPRIL 40 MG PO TABS
40.0000 mg | ORAL_TABLET | Freq: Every day | ORAL | Status: DC
  Administered 2020-06-20 – 2020-06-22 (×3): 40 mg via ORAL
  Filled 2020-06-20 (×3): qty 1

## 2020-06-20 MED ORDER — METOPROLOL TARTRATE 25 MG PO TABS
25.0000 mg | ORAL_TABLET | Freq: Two times a day (BID) | ORAL | Status: DC
  Administered 2020-06-20 – 2020-06-22 (×5): 25 mg via ORAL
  Filled 2020-06-20 (×5): qty 1

## 2020-06-20 MED ORDER — VITAMIN D 25 MCG (1000 UNIT) PO TABS
1000.0000 [IU] | ORAL_TABLET | Freq: Every day | ORAL | Status: DC
  Administered 2020-06-20 – 2020-06-22 (×3): 1000 [IU] via ORAL
  Filled 2020-06-20 (×3): qty 1

## 2020-06-20 MED ORDER — HYDROCHLOROTHIAZIDE 25 MG PO TABS: 25.0000 mg | ORAL_TABLET | Freq: Every day | ORAL | Status: DC

## 2020-06-20 MED ORDER — AMLODIPINE BESYLATE 10 MG PO TABS
10.0000 mg | ORAL_TABLET | Freq: Every day | ORAL | Status: DC
  Administered 2020-06-20 – 2020-06-22 (×3): 10 mg via ORAL
  Filled 2020-06-20 (×3): qty 1

## 2020-06-20 MED ORDER — LINAGLIPTIN 5 MG PO TABS
5.0000 mg | ORAL_TABLET | Freq: Every day | ORAL | Status: DC
  Administered 2020-06-20 – 2020-06-22 (×3): 5 mg via ORAL
  Filled 2020-06-20 (×3): qty 1

## 2020-06-20 NOTE — Plan of Care (Signed)
  Problem: Education: Goal: Knowledge of General Education information will improve Description: Including pain rating scale, medication(s)/side effects and non-pharmacologic comfort measures Outcome: Progressing   Problem: Pain Managment: Goal: General experience of comfort will improve Outcome: Progressing   

## 2020-06-20 NOTE — Progress Notes (Signed)
PROGRESS NOTE  Omar Todd:[id number] DOB: March 16, 1951 DOA: 06/18/2020 PCP: [name], DO   LOS: 2 days   Brief narrative: As per HPI,  Omar Todd is a 69 y.o. male with medical history significant for hypertension, hyperlipidemia, COPD, AAA and recent BKA on June 27 by Dr. Sharol Given in [place] who presented  to the emergency department due to several days of drainage, bleeding and wound infection to the right stump.  Wife was at bedside and provided some of the history.  Patient saw the orthopedic surgeon (Dr. Sharol Given) about 5 days ago, at that time, patient had mild drainage from the wound which was considered to be normal per wife at bedside, he was prescribed with an antibiotic (patient cannot remember the name of the antibiotic) and he stated that he has been compliant with the antibiotics.  Patient also had a follow-up with his PCP 2 days ago and the wound was said to be healing well.  Wife states that the wound opened up more for one day and there was bleeding, drainage and foul-smelling discharge, he contacted his nurse online who asked him to go to the ED for further evaluation and management.  In the emergency department, he was hemodynamically stable.  Work-up in the ED showed normocytic anemia, thrombocytosis and hyperglycemia.  SARS coronavirus was negative.  Right knee x-ray showed postsurgical change without acute abnormality.  Patient was started on IV vancomycin and cefepime.  IV hydration was provided.  Orthopedic surgeon (Dr. Sharol Given) was consulted.  Hospitalist was asked to admit patient for further evaluation and management.   Assessment/Plan:  Principal Problem:   Right BKA infection ([name]) Active Problems:   Hyperlipidemia associated with type 2 diabetes mellitus ([name])   Hypertension associated with diabetes ([name])   Normocytic anemia   Pressure injury of skin   Hyperglycemia due to diabetes mellitus (HCC)   Thrombocytosis (HCC)  Right BKA stump infection  with fixed flexion contracture of the hip  on IV Vancomycin and cefepime, will discontinue antibiotic 24 hours after surgery.  Status post above-knee amputation by Dr Sharol Given on 06/19/20.  Postoperative management as per orthopedics.  On wound VAC to be continued for 1 week.  Discontinue IV fluids.  Hyperglycemia secondary to type II diabetes mellitus Continue insulin sliding scale and hypoglycemia protocol.  Last POC glucose of 302.  Will resume Januvia continue sliding scale insulin.  Hold Metformin.  Continue diabetic diet.  Thrombocytosis possibly reactive Platelets 385;  monitor CBC.  Essential hypertension We will resume metoprolol,, hydrochlorothiazide and lisinopril from home.  Normocytic anemia with mild acute blood loss anemia  will monitor CBC closely.  Hemoglobin today of 7.1 from 8.5.  Transfuse for hemoglobin less than 7.  COPD  Continue Singulair, nebulizers  Bilateral buttocks stage II pressure ulceration, present on admission.  Continue wound care.  DVT prophylaxis: SCDs Start: 06/19/20 1627 heparin injection 5,000 Units Start: 06/19/20 0000 SCDs Start: 06/18/20 0517   Code Status: Full code  Family Communication: I spoke with the patient's and niece at bedside and updated her about the clinical condition of the patient.  Status is: Inpatient  Remains inpatient appropriate because:IV treatments appropriate due to intensity of illness or inability to take PO and Inpatient level of care appropriate due to severity of illness, status post above knee amputation   Dispo: The patient is from: Home              Anticipated d/c is to: Home with home health/skilled nursing  facility.  Physical therapy recommended skilled nursing facility placement.  I had a prolonged discussion with the patient and the patient's niece at bedside.  Patient currently lives alone by himself.  Transition of care on board              Anticipated d/c date is: 2 days              Patient  currently is  medically stable to d/c.  Consultants:  Orthopedics Dr. Sharol Given  Procedures: Right above-knee amputation by Dr Sharol Given on 06/19/20.   Antibiotics:  . Vancomycin and cefepime  Anti-infectives (From admission, onward)   Start     Dose/Rate Route Frequency Ordered Stop   06/20/20 0600  clindamycin (CLEOCIN) IVPB 900 mg        900 mg 100 mL/hr over 30 Minutes Intravenous On call to O.R. 06/19/20 1242 06/19/20 1414   06/19/20 1245  clindamycin (CLEOCIN) 900 MG/50ML IVPB       Note to Pharmacy: Laurita Quint   : cabinet override      06/19/20 1245 06/19/20 1415   06/18/20 1000  ceFEPIme (MAXIPIME) 2 g in sodium chloride 0.9 % 100 mL IVPB     Discontinue     2 g 200 mL/hr over 30 Minutes Intravenous Every 12 hours 06/18/20 0712     06/18/20 0900  vancomycin (VANCOREADY) IVPB 750 mg/150 mL     Discontinue     750 mg 150 mL/hr over 60 Minutes Intravenous Every 12 hours 06/18/20 0814     06/18/20 0200  ceFEPIme (MAXIPIME) 2 g in sodium chloride 0.9 % 100 mL IVPB        2 g 200 mL/hr over 30 Minutes Intravenous  Once 06/18/20 0148 06/18/20 0505   06/18/20 0145  vancomycin (VANCOCIN) IVPB 1000 mg/200 mL premix        1,000 mg 200 mL/hr over 60 Minutes Intravenous  Once 06/18/20 0144 06/18/20 0505     Subjective: Today, patient was seen and examined at bedside.  Denies any overt pain, nausea, vomiting, fever shortness of breath or chest pain.   Objective: Vitals:   06/19/20 2130 06/20/20 0502  BP: (!) 141/62 (!) 141/77  Pulse: 74 81  Resp: 17 19  Temp: 98.2 F (36.8 C) 98.4 F (36.9 C)  SpO2: 94% 100%    Intake/Output Summary (Last 24 hours) at 06/20/2020 0839 Last data filed at 06/20/2020 0600 Gross per 24 hour  Intake 2844.29 ml  Output 575 ml  Net 2269.29 ml   Filed Weights   06/17/20 2124 06/19/20 2130  Weight: 78 kg 78 kg   Body mass index is 22.08 kg/m.   Physical Exam: GENERAL: Patient is alert awake and oriented. Not in obvious distress. HENT: Mild  pallor noted.. Pupils equally reactive to light. Oral mucosa is moist NECK: is supple, no gross swelling noted. CHEST: Clear to auscultation. No crackles or wheezes.  Diminished breath sounds bilaterally. CVS: S1 and S2 heard, no murmur. Regular rate and rhythm.  ABDOMEN: Soft, non-tender, bowel sounds are present. EXTREMITIES: Right upper lobe-knee amputation with wound VAC and dressing.  CNS: Cranial nerves are intact.  Moving upper extremities. SKIN: Right above-knee amputation with stump covered in dressing and wound VAC.   Data Review: I have personally reviewed the following laboratory data and studies,  CBC: Recent Labs  Lab 06/18/20 0216 06/20/20 0427  WBC 9.0 10.1  NEUTROABS 6.5  --   HGB 8.5* 7.1*  HCT 29.3* 24.9*  MCV  80.3 80.3  PLT 480* 009   Basic Metabolic Panel: Recent Labs  Lab 06/18/20 0216 06/20/20 0427  NA 138 137  K 3.9 4.3  CL 102 104  CO2 27 27  GLUCOSE 255* 192*  BUN 22 10  CREATININE 0.71 0.70  CALCIUM 9.1 8.5*  MG  --  1.9  PHOS  --  3.4   Liver Function Tests: Recent Labs  Lab 06/20/20 0427  AST 9*  ALT 10  ALKPHOS 67  BILITOT 0.4  PROT 5.6*  ALBUMIN 2.1*   No results for input(s): LIPASE, AMYLASE in the last 168 hours. No results for input(s): AMMONIA in the last 168 hours. Cardiac Enzymes: No results for input(s): CKTOTAL, CKMB, CKMBINDEX, TROPONINI in the last 168 hours. BNP (last 3 results) No results for input(s): BNP in the last 8760 hours.  ProBNP (last 3 results) No results for input(s): PROBNP in the last 8760 hours.  CBG: Recent Labs  Lab 06/19/20 1121 06/19/20 1504 06/19/20 1630 06/19/20 2130 06/20/20 0648  GLUCAP 138* 134* 153* 299* 302*   Recent Results (from the past 240 hour(s))  SARS Coronavirus 2 by RT PCR (hospital order, performed in [facility] hospital lab) Nasopharyngeal Nasopharyngeal Swab     Status: None   Collection Time: 06/18/20  2:15 AM   Specimen: Nasopharyngeal Swab  Result Value Ref  Range Status   SARS Coronavirus 2 NEGATIVE NEGATIVE Final    Comment: (NOTE) SARS-CoV-2 target nucleic acids are NOT DETECTED.  The SARS-CoV-2 RNA is generally detectable in upper and lower respiratory specimens during the acute phase of infection. The lowest concentration of SARS-CoV-2 viral copies this assay can detect is 250 copies / mL. A negative result does not preclude SARS-CoV-2 infection and should not be used as the sole basis for treatment or other patient management decisions.  A negative result may occur with improper specimen collection / handling, submission of specimen other than nasopharyngeal swab, presence of viral mutation(s) within the areas targeted by this assay, and inadequate number of viral copies (<250 copies / mL). A negative result must be combined with clinical observations, patient history, and epidemiological information.  Fact Sheet for Patients:   StrictlyIdeas.no  Fact Sheet for Healthcare Providers: BankingDealers.co.za  This test is not yet approved or  cleared by the [place] FDA and has been authorized for detection and/or diagnosis of SARS-CoV-2 by FDA under an Emergency Use Authorization (EUA).  This EUA will remain in effect (meaning this test can be used) for the duration of the COVID-19 declaration under Section 564(b)(1) of the Act, 21 U.S.C. section 360bbb-3(b)(1), unless the authorization is terminated or revoked sooner.  Performed at [facility], [address] [place], [place] [address]   Blood culture (routine x 2)     Status: None (Preliminary result)   Collection Time: 06/18/20  2:18 AM   Specimen: BLOOD RIGHT FOREARM  Result Value Ref Range Status   Specimen Description BLOOD RIGHT FOREARM  Final   Special Requests   Final    BOTTLES DRAWN AEROBIC AND ANAEROBIC Blood Culture adequate volume   Culture   Final    NO GROWTH 2 DAYS Performed at [facility], [address] [place], [place] [address]    Report Status PENDING  Incomplete  Blood culture (routine x 2)     Status: None (Preliminary result)   Collection Time: 06/18/20  2:26 AM   Specimen: Left Antecubital; Blood  Result Value Ref Range Status   Specimen Description  LEFT ANTECUBITAL  Final   Special Requests   Final    BOTTLES DRAWN AEROBIC AND ANAEROBIC Blood Culture adequate volume   Culture   Final    NO GROWTH 2 DAYS Performed at [facility], [address] [place], [place] [address]    Report Status PENDING  Incomplete     Studies: No results found.    Flora Lipps, MD  [facility] 06/20/2020

## 2020-06-20 NOTE — TOC Initial Note (Signed)
Transition of Care [facility]) - Initial/Assessment Note    Patient Details  Name: Omar Todd MRN: [id number] Date of Birth: [birth year]  Transition of Care [facility]) CM/SW Contact:    Jacquelynn Cree Phone Number: 06/20/2020, 2:02 PM  Clinical Narrative:                 CSW consulted for PT recommendation of short-term SNF placement at discharge. CSW spoke with patient and patient's aunt [name]. Patient was recently at [facility] for about 2 weeks. CSW answered questions regarding insurance authorization.  Patient expressed he would like to discharge home with home health. Patient is currently active with [facility] and would like to continue the services.    Expected Discharge Plan: [place] Barriers to Discharge: No Barriers Identified   Patient Goals and CMS Choice   CMS Medicare.gov Compare Post Acute Care list provided to:: Patient Choice offered to / list presented to : Patient  Expected Discharge Plan and Services Expected Discharge Plan: [place]       Living arrangements for the past 2 months: Mobile Home                                      Prior Living Arrangements/Services Living arrangements for the past 2 months: Mobile Home Lives with:: Self Patient language and need for interpreter reviewed:: Yes Do you feel safe going back to the place where you live?: Yes      Need for Family Participation in Patient Care: No (Comment) Care giver support system in place?: Yes (comment)   Criminal Activity/Legal Involvement Pertinent to Current Situation/Hospitalization: No - Comment as needed  Activities of Daily Living Home Assistive Devices/Equipment: Crutches, Wheelchair ADL Screening (condition at time of admission) Patient's cognitive ability adequate to safely complete daily activities?: Yes Is the patient deaf or have difficulty hearing?: No Does the patient have difficulty seeing, even when  wearing glasses/contacts?: No Does the patient have difficulty concentrating, remembering, or making decisions?: No Patient able to express need for assistance with ADLs?: Yes Does the patient have difficulty dressing or bathing?: No Independently performs ADLs?: No In/Out Bed: Needs assistance Does the patient have difficulty walking or climbing stairs?: Yes Weakness of Legs: Both Weakness of Arms/Hands: None  Permission Sought/Granted Permission sought to share information with : Family Supports    Share Information with NAME: [name]     Permission granted to share info w Relationship: Niece  Permission granted to share info w Contact Information: [phone]  Emotional Assessment   Attitude/Demeanor/Rapport: Unable to Assess Affect (typically observed): Unable to Assess Orientation: : Oriented to Self, Oriented to Place, Oriented to  Time, Oriented to Situation Alcohol / Substance Use: Not Applicable Psych Involvement: No (comment)  Admission diagnosis:  Pain [R52] Right BKA infection (HCC) [T87.43] Infection of superficial incisional surgical site after procedure, initial encounter [T81.41XA] Patient Active Problem List   Diagnosis Date Noted  . 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 Hyperglycemia due to diabetes mellitus ([place]) 06/18/2020  . Thrombocytosis ([place]) 06/18/2020  . Pressure injury of skin 05/16/2020  . Severe protein-calorie malnutrition ([place])   . Gangrene of right foot ([place]) 03/26/2020  . PAD (peripheral artery disease) ([place]) 03/26/2020  . Normocytic anemia 03/12/2020  . GI bleed 03/12/2020  . AAA (abdominal aortic aneurysm) without rupture ([place]) 02/28/2020  . Critical lower limb  ischemia 02/28/2020  . BPH (benign prostatic hyperplasia) 10/31/2014  . Systolic ejection murmur 96/75/9163  . Bilateral carotid bruits 10/31/2014  . Erectile dysfunction 11/05/2013  . Bilateral carotid artery disease ([place]) 04/11/2013  . Hyperlipidemia associated with type 2  diabetes mellitus ([place]) 04/11/2013  . Hypertension associated with diabetes ([place]) 04/11/2013  . COPD exacerbation ([place]) 04/11/2013  . Diabetes mellitus type 2 controlled 04/11/2013  . Diabetic neuropathy, type II diabetes mellitus ([place]) 03/15/2013   PCP:  [name], DO Pharmacy:   [place], [place] [address] Phone: [phone] Fax: [place], [place] [address] Phone: [phone] Fax: [phone]  [facility], [place] [address] Phone: [phone] Fax: [phone]     Social Determinants of Health (SDOH) Interventions    Readmission Risk Interventions Readmission Risk Prevention Plan 05/18/2020  Transportation Screening Complete  PCP or Specialist Appt within 5-7 Days Not Complete  Not Complete comments plan for SNF  PCP or Specialist Appt within 3-5 Days Not Complete  Not Complete comments plan for SNF  Home Care Screening Complete  Medication Review (RN CM) Referral to Pharmacy  HRI or Home Care Consult Complete  Social Work Consult for [place] Planning/Counseling Complete  Palliative Care Screening Not Complete  Palliative Care Screening Not Complete Comments could be appropriate  Medication Review (RN Care Manager) Referral to Pharmacy  Some recent data might be hidden

## 2020-06-20 NOTE — Progress Notes (Signed)
[name], male   DOB: 1950/12/02, 69 y.o.   MRN: [id number] Patient is postoperative day 1 above-the-knee amputation on the right.  There is no drainage in the wound VAC canister.  Patient states that he does want to go home but in speaking with patient's family he does not have around-the-clock care which she most likely will need.  Family has requested social worker consult anticipate patient will be discharged to skilled nursing.

## 2020-06-20 NOTE — Evaluation (Signed)
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 PMH includes COPD, HTN, DM2, anemia, chronic gastritis, and GI bleed.    Clinical Impression  Pt presented supine in bed with HOB elevated, awake and willing to participate in therapy session. Prior to admission, pt reported that he has been using a RW and w/c primarily for mobility, he can perform transfers on his own and requires intermittent assist with ADLs/IADLs. Pt's niece present in room and reporting that pt does not always provide accurate information and she is unsure if he's had any falls at home as he will not tell her. At the time of evaluation, pt with very poor safety awareness and insight into his medical status. He required min guard for bed mobility, mod A x2 for transfers and min-mod A x2 to hop a very short distance with RW. Pt very unsteady overall in standing and with transfers, but seemingly unaware. Pt agreeable to short-term rehab at a SNF as long as it is NOT [employer]. Family requesting [facility]. Pt would continue to benefit from skilled physical therapy services at this time while admitted and after d/c to address the below listed limitations in order to improve overall safety and independence with functional mobility.     Follow Up Recommendations SNF    Equipment Recommendations  None recommended by PT    Recommendations for Other Services       Precautions / Restrictions Precautions Precautions: Fall Precaution Comments: R AKA; wound vac Restrictions Weight Bearing Restrictions: Yes RLE Weight Bearing: Non weight bearing      Mobility  Bed Mobility Overal bed mobility: Needs Assistance Bed Mobility: Supine to Sit     Supine to sit: Min guard     General bed mobility comments: increased time and effort to come to  EOB  Transfers Overall transfer level: Needs assistance Equipment used: Rolling walker (2 wheeled) Transfers: Sit to/from [facility] Sit to Stand: Mod assist;+2 safety/equipment Stand pivot transfers: Mod assist;+2 physical assistance;+2 safety/equipment       General transfer comment: pt requires assist to maintain balance in standing due to posterior and R lateral lean  Ambulation/Gait Ambulation/Gait assistance: Min assist;Mod assist;+2 safety/equipment Gait Distance (Feet): 5 Feet Assistive device: Rolling walker (2 wheeled) Gait Pattern/deviations:  (hop-to on L LE)     General Gait Details: pt able to take several hops forwards on L LE with RW and min-mod A x2 with close chair follow; pt a bit impulsive and attempting to advance too quickly without maintaining his balance, required occasional mod A for stability  Stairs            Wheelchair Mobility    Modified Rankin (Stroke Patients Only)       Balance Overall balance assessment: Needs assistance Sitting-balance support: Bilateral upper extremity supported;Single extremity supported Sitting balance-Leahy Scale: Poor     Standing balance support: Bilateral upper extremity supported;During functional activity Standing balance-Leahy Scale: Poor Standing balance comment: reliant on external support                             Pertinent Vitals/Pain Pain Assessment: No/denies pain    Home Living Family/patient expects to be discharged to:: Private residence Living Arrangements: Alone Available Help at Discharge: Family;Friend(s);Available PRN/intermittently Type of  Home: Mobile home Home Access: Ramped entrance     Home Layout: One level Home Equipment: Walker - standard;Bedside commode;Wheelchair - manual      Prior Function Level of Independence: Needs assistance   Gait / Transfers Assistance Needed: Uses w/c and RW  ADL's / Homemaking Assistance Needed: reports  having as needed assist for BADL (niece comfirms due to pt limiting details). Support for IADLs from daughter (finances, meals, etc.)        Hand Dominance        Extremity/Trunk Assessment   Upper Extremity Assessment Upper Extremity Assessment: Defer to OT evaluation    Lower Extremity Assessment Lower Extremity Assessment: RLE deficits/detail RLE Deficits / Details: s/p transfemoral amputation with wound VAC in place    Cervical / Trunk Assessment Cervical / Trunk Assessment: Normal  Communication   Communication: No difficulties  Cognition Arousal/Alertness: Awake/alert Behavior During Therapy: WFL for tasks assessed/performed Overall Cognitive Status: History of cognitive impairments - at baseline Area of Impairment: Memory;Safety/judgement;Awareness;Problem solving                     Memory: Decreased short-term memory   Safety/Judgement: Decreased awareness of safety;Decreased awareness of deficits Awareness: Emergent Problem Solving: Slow processing;Requires verbal cues;Requires tactile cues General Comments: per neice in room, pt has baseline intellectual deficits. Overall poor health literacy and limited insight into current condition/needs      General Comments      Exercises Other Exercises Other Exercises: PT demonstrated and instructed pt on gentle tactile input to distal aspect of R residual limb for desensitization   Assessment/Plan    PT Assessment Patient needs continued PT services  PT Problem List Decreased strength;Decreased range of motion;Decreased activity tolerance;Decreased balance;Decreased mobility;Decreased coordination;Decreased cognition;Decreased knowledge of use of DME;Decreased safety awareness;Decreased knowledge of precautions       PT Treatment Interventions DME instruction;Gait training;Stair training;Functional mobility training;Therapeutic activities;Therapeutic exercise;Balance training;Neuromuscular  re-education;Cognitive remediation;Patient/family education    PT Goals (Current goals can be found in the Care Plan section)  Acute Rehab PT Goals Patient Stated Goal: return to independence PT Goal Formulation: With patient/family Time For Goal Achievement: 07/04/20 Potential to Achieve Goals: Good    Frequency Min 5X/week   Barriers to discharge Decreased caregiver support      Co-evaluation PT/OT/SLP Co-Evaluation/Treatment: Yes Reason for Co-Treatment: For patient/therapist safety;To address functional/ADL transfers PT goals addressed during session: Mobility/safety with mobility;Balance;Proper use of DME;Strengthening/ROM         AM-PAC PT "6 Clicks" Mobility  Outcome Measure Help needed turning from your back to your side while in a flat bed without using bedrails?: None Help needed moving from lying on your back to sitting on the side of a flat bed without using bedrails?: None Help needed moving to and from a bed to a chair (including a wheelchair)?: A Lot Help needed standing up from a chair using your arms (e.g., wheelchair or bedside chair)?: A Lot Help needed to walk in hospital room?: A Lot Help needed climbing 3-5 steps with a railing? : Total 6 Click Score: 15    End of Session Equipment Utilized During Treatment: Gait belt Activity Tolerance: Patient tolerated treatment well Patient left: in chair;with call bell/phone within reach;with chair alarm set;with family/visitor present Nurse Communication: Mobility status PT Visit Diagnosis: Other abnormalities of gait and mobility (R26.89)    Time: 1610-9604 PT Time Calculation (min) (ACUTE ONLY): 29 min   Charges:   PT Evaluation $PT Eval Moderate Complexity: 1 Mod  Anastasio Champion, DPT  Acute Rehabilitation Services Pager [phone] Office [place] 06/20/2020, 1:55 PM

## 2020-06-20 NOTE — Evaluation (Signed)
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 PMH includes COPD, HTN, DM2, anemia, chronic gastritis, and GI bleed.   Clinical Impression   PTA pt living with alone with intermittent assist from family/friends for BADL/IADL. He reports a 2 week SNF stay after original amputation that was not a favorable experience that ultimately lead to sacral wounds. At time of eval, pt presents with ability to complete bed mobility at min guard and sit <> stands at mod A +2 with RW. Pt is unsteady with mobility, and loses balance laterally or posteriorly. He is able to progress to hop to gait with RW. He currently requires max A for LB dressing. Noted cognitive at baseline impacting overall health literacy, problem solving, and safety awareness. Given current status, recommend SNF to support safety, BADL engagement, independent PLOF. OT will continue to follow per POC listed below.   Follow Up Recommendations  SNF (prefers [facility])    Equipment Recommendations  None recommended by OT    Recommendations for Other Services       Precautions / Restrictions Precautions Precautions: Fall Precaution Comments: R AKA; wound vac Restrictions Weight Bearing Restrictions: Yes RLE Weight Bearing: Non weight bearing      Mobility Bed Mobility Overal bed mobility: Needs Assistance Bed Mobility: Supine to Sit     Supine to sit: Min guard     General bed mobility comments: increased time and effort to come to EOB  Transfers Overall transfer level: Needs assistance Equipment used: Rolling walker (2 wheeled) Transfers: Sit to/from [facility] Sit to Stand: Mod assist;+2 safety/equipment Stand pivot transfers: Mod assist;+2 physical assistance;+2 safety/equipment       General transfer comment: pt requires  assist to maintain balance in standing due to posterior and lateral leans    Balance Overall balance assessment: Needs assistance Sitting-balance support: Bilateral upper extremity supported Sitting balance-Leahy Scale: Fair     Standing balance support: Bilateral upper extremity supported;During functional activity Standing balance-Leahy Scale: Poor Standing balance comment: reliant on external support                           ADL either performed or assessed with clinical judgement   ADL Overall ADL's : Needs assistance/impaired Eating/Feeding: Set up;Sitting   Grooming: Set up;Sitting   Upper Body Bathing: Set up;Sitting   Lower Body Bathing: Maximal assistance;Sit to/from stand   Upper Body Dressing : Set up;Sitting   Lower Body Dressing: Maximal assistance;Sitting/lateral leans;Sit to/from stand Lower Body Dressing Details (indicate cue type and reason): requires assist to maintain standing balance Toilet Transfer: Moderate assistance;+2 for physical assistance;+2 for safety/equipment;RW Toilet Transfer Details (indicate cue type and reason): assist to maintain balance in standing with RW Toileting- Clothing Manipulation and Hygiene: Moderate assistance;Sitting/lateral lean;Sit to/from stand       Functional mobility during ADLs: Moderate assistance;+2 for physical assistance;+2 for safety/equipment;Rolling walker       Vision Patient Visual Report: No change from baseline       Perception     Praxis      Pertinent Vitals/Pain Pain Assessment: No/denies pain     Hand Dominance     Extremity/Trunk Assessment Upper Extremity Assessment Upper Extremity Assessment: Generalized weakness   Lower Extremity Assessment Lower Extremity Assessment: Defer to PT evaluation  Communication Communication Communication: No difficulties   Cognition Arousal/Alertness: Awake/alert Behavior During Therapy: WFL for tasks assessed/performed Overall  Cognitive Status: History of cognitive impairments - at baseline Area of Impairment: Memory;Safety/judgement;Awareness;Problem solving                     Memory: Decreased short-term memory   Safety/Judgement: Decreased awareness of safety;Decreased awareness of deficits Awareness: Emergent Problem Solving: Slow processing;Requires verbal cues;Requires tactile cues General Comments: per neice in room, pt has baseline intellectual deficits. Overall poor health literacy and limited insight into current condition/needs   General Comments       Exercises     Shoulder Instructions      Home Living Family/patient expects to be discharged to:: Private residence Living Arrangements: Alone Available Help at Discharge: Family;Friend(s);Available PRN/intermittently Type of Home: Mobile home Home Access: Ramped entrance     Home Layout: One level     Bathroom Shower/Tub:  (sponge bathes)   Bathroom Toilet: Standard     Home Equipment: Walker - standard;Bedside commode;Wheelchair - manual          Prior Functioning/Environment Level of Independence: Needs assistance  Gait / Transfers Assistance Needed: Uses w/c and RW ADL's / Homemaking Assistance Needed: reports having as needed assist for BADL (niece comfirms due to pt limiting details). Support for IADLs from daughter (finances, meals, etc.)            OT Problem List: Decreased strength;Decreased knowledge of use of DME or AE;Decreased knowledge of precautions;Decreased activity tolerance;Impaired balance (sitting and/or standing);Decreased safety awareness      OT Treatment/Interventions: Self-care/ADL training;Therapeutic exercise;Patient/family education;Balance training;Energy conservation;Therapeutic activities;DME and/or AE instruction    OT Goals(Current goals can be found in the care plan section) Acute Rehab OT Goals Patient Stated Goal: return to independence OT Goal Formulation: With patient Time  For Goal Achievement: 07/04/20 Potential to Achieve Goals: Good  OT Frequency: Min 2X/week   Barriers to D/C:            Co-evaluation              AM-PAC OT "6 Clicks" Daily Activity     Outcome Measure Help from another person eating meals?: A Little Help from another person taking care of personal grooming?: A Little Help from another person toileting, which includes using toliet, bedpan, or urinal?: A Lot Help from another person bathing (including washing, rinsing, drying)?: A Lot Help from another person to put on and taking off regular upper body clothing?: A Little Help from another person to put on and taking off regular lower body clothing?: A Lot 6 Click Score: 15   End of Session Equipment Utilized During Treatment: Rolling walker;Gait belt Nurse Communication: Mobility status  Activity Tolerance: Patient tolerated treatment well Patient left: in chair;with call bell/phone within reach  OT Visit Diagnosis: Other abnormalities of gait and mobility (R26.89);Muscle weakness (generalized) (M62.81);Other symptoms and signs involving cognitive function                Time: 9242-6834 OT Time Calculation (min): 28 min Charges:  OT General Charges $OT Visit: 1 Visit OT Evaluation $OT Eval Moderate Complexity: [place], MSOT, OTR/L [facility] Office Number: [phone] Pager: [phone]  Zenovia Jarred 06/20/2020, 1:07 PM

## 2020-06-21 LAB — BASIC METABOLIC PANEL
Anion gap: 8 (ref 5–15)
BUN: 8 mg/dL (ref 8–23)
CO2: 25 mmol/L (ref 22–32)
Calcium: 8.6 mg/dL — ABNORMAL LOW (ref 8.9–10.3)
Chloride: 105 mmol/L (ref 98–111)
Creatinine, Ser: 0.64 mg/dL (ref 0.61–1.24)
GFR calc Af Amer: >60 mL/min (ref >60)
GFR calc non Af Amer: >60 mL/min (ref >60)
Glucose, Bld: 175 mg/dL — ABNORMAL HIGH (ref 70–99)
Potassium: 3.9 mmol/L (ref 3.5–5.1)
Sodium: 138 mmol/L (ref 135–145)

## 2020-06-21 LAB — GLUCOSE, CAPILLARY
Glucose-Capillary: 172 mg/dL — ABNORMAL HIGH (ref 70–99)
Glucose-Capillary: 195 mg/dL — ABNORMAL HIGH (ref 70–99)
Glucose-Capillary: 164 mg/dL — ABNORMAL HIGH (ref 70–99)
Glucose-Capillary: 126 mg/dL — ABNORMAL HIGH (ref 70–99)

## 2020-06-21 LAB — CBC
HCT: 25.2 % — ABNORMAL LOW (ref 39.0–52.0)
Hemoglobin: 7.2 g/dL — ABNORMAL LOW (ref 13.0–17.0)
MCH: 22.6 pg — ABNORMAL LOW (ref 26.0–34.0)
MCHC: 28.6 g/dL — ABNORMAL LOW (ref 30.0–36.0)
MCV: 79.2 fL — ABNORMAL LOW (ref 80.0–100.0)
Platelets: 394 10*3/uL (ref 150–400)
RBC: 3.18 MIL/uL — ABNORMAL LOW (ref 4.22–5.81)
RDW: 18.8 % — ABNORMAL HIGH (ref 11.5–15.5)
WBC: 8 10*3/uL (ref 4.0–10.5)
nRBC: 0 % (ref 0.0–0.2)

## 2020-06-21 NOTE — Care Management (Addendum)
Case manager contacted by Bedside RN concerning patient going home with wound vac. Per Dr. Jess Barters op note patient will need a Prevena for one week. CM informed nurse to order  Prevena from [facility] or [facility] and connect patient when he is ready for discharge. Patient is active with [facility], Will need resumption of care HH orders. CM will notify liaison of possible discharge.  Ricki Miller, RN BSN Case Manager  [phone]

## 2020-06-21 NOTE — Progress Notes (Signed)
     Subjective: 2 Days Post-Op Procedure(s) (LRB): RIGHT ABOVE KNEE AMPUTATION (Right) Awake, alert and oriented x 4. VAC right AKA revision. Patient reports pain as mild.    Objective:   VITALS:  Temp:  [98 F (36.7 C)-99.5 F (37.5 C)] 99.5 F (37.5 C) (08/01 0915) Pulse Rate:  [68-87] 84 (08/01 0915) Resp:  [18] 18 (08/01 0915) BP: (142-165)/(66-78) 142/75 (08/01 0915) SpO2:  [88 %-97 %] 95 % (08/01 0915)  Neurologically intact ABD soft Neurovascular intact Sensation intact distally Incision: dressing C/D/I, no drainage, scant drainage and VAC intact, little drainage.    LABS Recent Labs    06/20/20 0427 06/21/20 0507  HGB 7.1* 7.2*  WBC 10.1 8.0  PLT 385 394   Recent Labs    06/20/20 0427 06/21/20 0507  NA 137 138  K 4.3 3.9  CL 104 105  CO2 27 25  BUN 10 8  CREATININE 0.70 0.64  GLUCOSE 192* 175*   No results for input(s): LABPT, INR in the last 72 hours.   Assessment/Plan: 2 Days Post-Op Procedure(s) (LRB): RIGHT ABOVE KNEE AMPUTATION (Right)  Advance diet Up with therapy D/C IV fluids Discharge home with home health  Kasyn Rolph 06/21/2020, 2:53 PMPatient ID: Jackquline Bosch, male   DOB: [birth year], 69 y.o.   MRN: [id number]

## 2020-06-21 NOTE — Plan of Care (Signed)
  Problem: Education: Goal: Knowledge of General Education information will improve Description: Including pain rating scale, medication(s)/side effects and non-pharmacologic comfort measures Outcome: Progressing   Problem: Pain Managment: Goal: General experience of comfort will improve Outcome: Progressing   Problem: Safety: Goal: Ability to remain free from injury will improve Outcome: Progressing   

## 2020-06-21 NOTE — Progress Notes (Signed)
PROGRESS NOTE  Omar Todd XTG:[id number] DOB: 1951/01/05 DOA: 06/18/2020 PCP: [name], DO   LOS: 3 days   Brief narrative: As per HPI,  Omar Todd is a 69 y.o. male with medical history significant for hypertension, hyperlipidemia, COPD, AAA and recent BKA on June 27 by Dr. Sharol Given in [place] who presented  to the emergency department due to several days of drainage, bleeding and wound infection to the right stump.  Wife was at bedside and provided some of the history.  Patient saw the orthopedic surgeon (Dr. Sharol Given) about 5 days ago, at that time, patient had mild drainage from the wound which was considered to be normal per wife at bedside, he was prescribed with an antibiotic (patient cannot remember the name of the antibiotic) and he stated that he has been compliant with the antibiotics.  Patient also had a follow-up with his PCP 2 days ago and the wound was said to be healing well.  Wife states that the wound opened up more for one day and there was bleeding, drainage and foul-smelling discharge, he contacted his nurse online who asked him to go to the ED for further evaluation and management.  In the emergency department, he was hemodynamically stable.  Work-up in the ED showed normocytic anemia, thrombocytosis and hyperglycemia.  SARS coronavirus was negative.  Right knee x-ray showed postsurgical change without acute abnormality.  Patient was started on IV vancomycin and cefepime.  IV hydration was provided.  Orthopedic surgeon (Dr. Sharol Given) was consulted.  Hospitalist was asked to admit patient for further evaluation and management.   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  Postoperative management as per orthopedics.  On wound VAC to be continued for 1 week.  Off antibiotics.  Patient has been seen by physical therapy recommend skilled nursing facility but patient is adamant about home health on discharge.  Ortho recommends nonweightbearing on the right lower extremity.  Transition of care has been consulted for DME wound VAC.  Hyperglycemia secondary to type II diabetes mellitus Continue insulin sliding scale and hypoglycemia protocol.  Last POC glucose of 172.  Will resume Januvia continue sliding scale insulin.  Hold Metformin.  Continue diabetic diet.  Thrombocytosis possibly reactive  Platelets 394;  monitor CBC.  Essential hypertension We will resume metoprolol,, hydrochlorothiazide and lisinopril from home.  Normocytic anemia with mild acute blood loss anemia  will monitor CBC closely.  Hemoglobin today of 7.2 from 8.1.  Transfuse for hemoglobin less than 7.  COPD  Continue Singulair, nebulizers  Bilateral buttocks stage II pressure ulceration, present on admission.  Continue wound care.  DVT prophylaxis: SCDs Start: 06/19/20 1627 heparin injection 5,000 Units Start: 06/19/20 0000 SCDs Start: 06/18/20 0517   Code Status: Full code  Family Communication: None today.  I spoke with the patient's and niece at bedside and updated her about the clinical condition of the patient yesterday.  Status is: Inpatient  Remains inpatient appropriate because:IV treatments appropriate due to intensity of illness or inability to take PO and Inpatient level of care appropriate due to severity of illness, status  post above knee amputation   Dispo: The patient is from: Home              Anticipated d/c is to: Home with home health               Anticipated d/c date is: 1 to 2 days.              Patient currently is  medically stable to  d/c.  Consultants:  Orthopedics Dr. Sharol Given  Procedures: Right above-knee amputation by Dr Sharol Given on 06/19/20.   Antibiotics:  None  Subjective: Today, patient was seen and examined at bedside.  She denies overt pain, nausea, vomiting, shortness of breath fever or chills.    Objective: Vitals:   06/20/20 2025 06/21/20 0454  BP: (!) 142/78 (!) 165/72  Pulse: 81 68  Resp: 18 18  Temp: 98.1 F (36.7 C) 98 F (36.7 C)  SpO2: 97% (!) 88%    Intake/Output Summary (Last 24 hours) at 06/21/2020 0857 Last data filed at 06/21/2020 0602 Gross per 24 hour  Intake 600 ml  Output 2000 ml  Net -1400 ml   Filed Weights   06/17/20 2124 06/19/20 2130  Weight: 78 kg 78 kg   Body mass index is 22.08 kg/m.   Physical Exam: GENERAL: Patient is alert awake and oriented. Not in obvious distress. HENT: Mild pallor noted.. Pupils equally reactive to light. Oral mucosa is moist NECK: is supple, no gross swelling noted. CHEST: Clear to auscultation. No crackles or wheezes.  Diminished breath sounds bilaterally. CVS: S1 and S2 heard, no murmur. Regular rate and rhythm.  ABDOMEN: Soft, non-tender, bowel sounds are present. EXTREMITIES: Right above-knee amputation with wound VAC and dressing.  CNS: Cranial nerves are intact.  Moving upper extremities. SKIN: Right above-knee amputation with stump covered in dressing and wound VAC.   Data Review: I have personally reviewed the following laboratory data and studies,  CBC: Recent Labs  Lab 06/18/20 0216 06/20/20 0427 06/21/20 0507  WBC 9.0 10.1 8.0  NEUTROABS 6.5  --   --   HGB 8.5* 7.1* 7.2*  HCT 29.3* 24.9* 25.2*  MCV 80.3 80.3 79.2*  PLT 480* 385 606   Basic Metabolic Panel: Recent Labs  Lab 06/18/20 0216 06/20/20 0427 06/21/20 0507  NA 138 137 138  K 3.9 4.3 3.9  CL 102 104 105  CO2 27 27 25   GLUCOSE 255* 192* 175*  BUN 22 10 8   CREATININE 0.71 0.70 0.64  CALCIUM 9.1 8.5* 8.6*  MG  --  1.9  --   PHOS  --  3.4  --    Liver  Function Tests: Recent Labs  Lab 06/20/20 0427  AST 9*  ALT 10  ALKPHOS 67  BILITOT 0.4  PROT 5.6*  ALBUMIN 2.1*   No results for input(s): LIPASE, AMYLASE in the last 168 hours. No results for input(s): AMMONIA in the last 168 hours. Cardiac Enzymes: No results for input(s): CKTOTAL, CKMB, CKMBINDEX, TROPONINI in the last 168 hours. BNP (last 3 results) No results for input(s): BNP in the last 8760 hours.  ProBNP (last 3 results) No results for input(s): PROBNP in the last 8760 hours.  CBG: Recent Labs  Lab 06/20/20 0648 06/20/20 1130 06/20/20 1653 06/20/20 2026 06/21/20 0659  GLUCAP 302* 261* 202* 159* 172*   Recent Results (from the past 240 hour(s))  SARS Coronavirus 2 by RT PCR (hospital order, performed in [facility] hospital lab) Nasopharyngeal Nasopharyngeal Swab  Status: None   Collection Time: 06/18/20  2:15 AM   Specimen: Nasopharyngeal Swab  Result Value Ref Range Status   SARS Coronavirus 2 NEGATIVE NEGATIVE Final    Comment: (NOTE) SARS-CoV-2 target nucleic acids are NOT DETECTED.  The SARS-CoV-2 RNA is generally detectable in upper and lower respiratory specimens during the acute phase of infection. The lowest concentration of SARS-CoV-2 viral copies this assay can detect is 250 copies / mL. A negative result does not preclude SARS-CoV-2 infection and should not be used as the sole basis for treatment or other patient management decisions.  A negative result may occur with improper specimen collection / handling, submission of specimen other than nasopharyngeal swab, presence of viral mutation(s) within the areas targeted by this assay, and inadequate number of viral copies (<250 copies / mL). A negative result must be combined with clinical observations, patient history, and epidemiological information.  Fact Sheet for Patients:   StrictlyIdeas.no  Fact Sheet for Healthcare  Providers: BankingDealers.co.za  This test is not yet approved or  cleared by the [place] FDA and has been authorized for detection and/or diagnosis of SARS-CoV-2 by FDA under an Emergency Use Authorization (EUA).  This EUA will remain in effect (meaning this test can be used) for the duration of the COVID-19 declaration under Section 564(b)(1) of the Act, 21 U.S.C. section 360bbb-3(b)(1), unless the authorization is terminated or revoked sooner.  Performed at [facility], [address], [place], [place] [address]   Blood culture (routine x 2)     Status: None (Preliminary result)   Collection Time: 06/18/20  2:18 AM   Specimen: BLOOD RIGHT FOREARM  Result Value Ref Range Status   Specimen Description BLOOD RIGHT FOREARM  Final   Special Requests   Final    BOTTLES DRAWN AEROBIC AND ANAEROBIC Blood Culture adequate volume   Culture   Final    NO GROWTH 2 DAYS Performed at [facility], [address] [place], [place] [address]    Report Status PENDING  Incomplete  Blood culture (routine x 2)     Status: None (Preliminary result)   Collection Time: 06/18/20  2:26 AM   Specimen: Left Antecubital; Blood  Result Value Ref Range Status   Specimen Description LEFT ANTECUBITAL  Final   Special Requests   Final    BOTTLES DRAWN AEROBIC AND ANAEROBIC Blood Culture adequate volume   Culture   Final    NO GROWTH 2 DAYS Performed at [facility], [address], [place], [place] [address]    Report Status PENDING  Incomplete     Studies: No results found.    Flora Lipps, MD  [facility] 06/21/2020

## 2020-06-21 NOTE — Plan of Care (Signed)
  Problem: Activity: Goal: Risk for activity intolerance will decrease Outcome: Progressing   
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
## 2020-06-22 ENCOUNTER — Telehealth: Payer: Self-pay | Admitting: *Deleted

## 2020-06-22 LAB — CBC
HCT: 26.1 % — ABNORMAL LOW (ref 39.0–52.0)
Hemoglobin: 7.5 g/dL — ABNORMAL LOW (ref 13.0–17.0)
MCH: 23 pg — ABNORMAL LOW (ref 26.0–34.0)
MCHC: 28.7 g/dL — ABNORMAL LOW (ref 30.0–36.0)
MCV: 80.1 fL (ref 80.0–100.0)
Platelets: 394 10*3/uL (ref 150–400)
RBC: 3.26 MIL/uL — ABNORMAL LOW (ref 4.22–5.81)
RDW: 18.7 % — ABNORMAL HIGH (ref 11.5–15.5)
WBC: 7.6 10*3/uL (ref 4.0–10.5)
nRBC: 0 % (ref 0.0–0.2)

## 2020-06-22 LAB — BASIC METABOLIC PANEL
Anion gap: 7 (ref 5–15)
BUN: 10 mg/dL (ref 8–23)
CO2: 26 mmol/L (ref 22–32)
Calcium: 8.7 mg/dL — ABNORMAL LOW (ref 8.9–10.3)
Chloride: 103 mmol/L (ref 98–111)
Creatinine, Ser: 0.66 mg/dL (ref 0.61–1.24)
GFR calc Af Amer: >60 mL/min (ref >60)
GFR calc non Af Amer: >60 mL/min (ref >60)
Glucose, Bld: 170 mg/dL — ABNORMAL HIGH (ref 70–99)
Potassium: 4.6 mmol/L (ref 3.5–5.1)
Sodium: 136 mmol/L (ref 135–145)

## 2020-06-22 LAB — GLUCOSE, CAPILLARY
Glucose-Capillary: 168 mg/dL — ABNORMAL HIGH (ref 70–99)
Glucose-Capillary: 167 mg/dL — ABNORMAL HIGH (ref 70–99)

## 2020-06-22 LAB — SURGICAL PATHOLOGY

## 2020-06-22 MED ORDER — DOCUSATE SODIUM 100 MG PO CAPS: 100.0000 mg | ORAL_CAPSULE | Freq: Two times a day (BID) | ORAL | 0 refills | Status: AC

## 2020-06-22 MED ORDER — METHOCARBAMOL 500 MG PO TABS: 500.0000 mg | ORAL_TABLET | Freq: Four times a day (QID) | ORAL | 0 refills | Status: AC | PRN

## 2020-06-22 MED ORDER — GABAPENTIN 300 MG PO CAPS: 300.0000 mg | ORAL_CAPSULE | Freq: Three times a day (TID) | ORAL | 0 refills | Status: AC

## 2020-06-22 NOTE — Progress Notes (Signed)
Physical Therapy Treatment Patient Details Name: Omar Todd MRN: [id number] DOB: [birth year] Today's Date: 06/22/2020    History of Present Illness Pt is a 69 y.o male s/p R AKA due to infection from BKA residual limb. PMH includes COPD, HTN, DM2, anemia, chronic gastritis, and GI bleed.    PT Comments    Pt progressing well with mobility. He required min guard assist bed mobility, min assist sit to stand with RW, and min assist SPT. He performed LE exercises in recliner. Pt is declining SNF. Plan is for d/c home today. He reports family is able to provide 24-hour assist. He does report his RW is too short (as it belonged to his wife). Discharge recommendations updated to HHPT.    Follow Up Recommendations  Home health PT;Supervision/Assistance - 24 hour     Equipment Recommendations  Rolling walker with 5" wheels    Recommendations for Other Services       Precautions / Restrictions Precautions Precautions: Fall;Other (comment) Precaution Comments: R AKA; wound vac Restrictions Weight Bearing Restrictions: No RLE Weight Bearing: Non weight bearing    Mobility  Bed Mobility Overal bed mobility: Needs Assistance Bed Mobility: Supine to Sit;Sit to Supine     Supine to sit: Min guard Sit to supine: Min guard   General bed mobility comments: min guard for safety  Transfers Overall transfer level: Needs assistance Equipment used: Rolling walker (2 wheeled) Transfers: Sit to/from [facility] Sit to Stand: Min assist Stand pivot transfers: Min assist       General transfer comment: cues for sequencing, increased time  Ambulation/Gait                 Stairs             Wheelchair Mobility    Modified Rankin (Stroke Patients Only)       Balance Overall balance assessment: Needs assistance Sitting-balance support: Feet supported;No upper extremity supported Sitting balance-Leahy Scale: Fair     Standing balance support:  During functional activity;Bilateral upper extremity supported Standing balance-Leahy Scale: Poor Standing balance comment: reliant on external support                            Cognition Arousal/Alertness: Awake/alert Behavior During Therapy: WFL for tasks assessed/performed Overall Cognitive Status: History of cognitive impairments - at baseline                                        Exercises General Exercises - Lower Extremity Ankle Circles/Pumps: AROM;Left;10 reps Heel Slides: AROM;Left;10 reps Straight Leg Raises: AROM;Left;10 reps Amputee Exercises Gluteal Sets: AROM;Both;10 reps Hip Extension: AROM;Right;10 reps Hip ABduction/ADduction: AROM;Right;10 reps    General Comments General comments (skin integrity, edema, etc.): wound vac R residual limb      Pertinent Vitals/Pain Pain Assessment: No/denies pain    Home Living                      Prior Function            PT Goals (current goals can now be found in the care plan section) Acute Rehab PT Goals Patient Stated Goal: return to independence Progress towards PT goals: Progressing toward goals    Frequency    Min 5X/week      PT Plan Discharge plan needs to be updated;Equipment recommendations  need to be updated    Co-evaluation              AM-PAC PT "6 Clicks" Mobility   Outcome Measure  Help needed turning from your back to your side while in a flat bed without using bedrails?: None Help needed moving from lying on your back to sitting on the side of a flat bed without using bedrails?: None Help needed moving to and from a bed to a chair (including a wheelchair)?: A Little Help needed standing up from a chair using your arms (e.g., wheelchair or bedside chair)?: A Little Help needed to walk in hospital room?: A Lot Help needed climbing 3-5 steps with a railing? : Total 6 Click Score: 17    End of Session Equipment Utilized During Treatment: Gait  belt Activity Tolerance: Patient tolerated treatment well Patient left: in chair;with call bell/phone within reach;with chair alarm set Nurse Communication: Mobility status PT Visit Diagnosis: Other abnormalities of gait and mobility (R26.89)     Time: 5929-2446 PT Time Calculation (min) (ACUTE ONLY): 16 min  Charges:  $Therapeutic Activity: 8-22 mins                     Omar Todd, PT  Office # [phone] Pager [phone]    Lorriane Shire 06/22/2020, 11:36 AM

## 2020-06-22 NOTE — Telephone Encounter (Signed)
TRANSITIONAL CARE MANAGEMENT TELEPHONE OUTREACH NOTE   Contact Date: 06/22/2020 Contacted HY:IFOYDX Omar Hartinger, LPN   DISCHARGE INFORMATION Date of Discharge:06/22/2020 Dis[facility] Discharge Diagnosis: Infection of Superficial Incisional Surgical Site after procedure, initial encounter  Outpatient Follow Up Recommendations (copied from discharge summary)  Start     Ordered   06/22/20 0000  Discharge wound care:       Comments: As per home health   06/22/20 1053              Follow-up Information        [name], NP. Schedule an appointment as soon as possible for a visit in 1 week(s).   Specialty: Orthopedic Surgery Why: for wound follow up Contact information: [facility] [address] [place], [place] Follow up.   Why: a representative from [facility] will contact you to resume your Home Health services. Contact information: [place] [address] [place], [place], DO. Schedule an appointment as soon as possible for a visit in 1 week(s).   Specialty: Family Medicine Why: for regular followup, blood work Contact information: [place] [address] [phone]        Omar Todd is a male primary care patient of [name], DO. An outgoing telephone call was made today and I spoke with Omar Todd (Sister).  Mr. Cosma condition(s) and treatment(s) were discussed. An opportunity to ask questions was provided and all were answered or forwarded as appropriate.    ACTIVITIES OF DAILY LIVING  EDWEN Todd lives alone and he cannot perform ADLs independently. his primary caregiver is himself. he is not able to depend on his primary caregiver(s) for consistent help. Transportation to appointments, to pick up medications, and to run errands is a problem.  (Consider referral to [facility]  CCM if transportation or a consistent caregiver is a problem)   Fall Risk Fall Risk  06/16/2020 04/17/2020  Falls in the past year? 0 1  Number falls in past yr: - 1  Injury with Fall? - 0  Comment - -  Risk for fall due to : - History of fall(s)  Follow up - Falls evaluation completed    high [place] Modifications/Assistive Devices Wheelchair: Yes Cane: No Ramp: No Bedside Toilet: No Hospital Bed:  No Other:    [place] he is receiving home health Nursing and Physical therapy services.     MEDICATION RECONCILIATION  [name] has been able to pick-up all prescribed discharge medications from the pharmacy.   A post discharge medication reconciliation was performed and the complete medication list was reviewed with the patient/caregiver and is current as of 06/22/2020. Changes highlighted below.  Discontinued Medications STOP taking these medications       doxycycline 100 MG tablet Commonly known as: VIBRA-TABS     Current Medication List Allergies as of 06/22/2020      Reactions   Penicillins Other (See Comments)   Pt. States he "passed out"      Medication List       Accurate as of June 22, 2020  2:56 PM. If you have any questions, ask your nurse or doctor.        amLODipine 10 MG tablet Commonly known as: NORVASC  Take 1 tablet (10 mg total) by mouth daily.   aspirin EC 81 MG tablet Take 1 tablet (81 mg total) by mouth daily with breakfast.   BIOTIN 5000 PO Take 5,000 mcg by mouth daily.   cholecalciferol 1000 units tablet Commonly known as: VITAMIN D Take 1,000 Units by mouth daily.   docusate sodium 100 MG capsule Commonly known as: COLACE Take 1 capsule (100 mg total) by mouth 2 (two) times daily.   escitalopram 10 MG tablet Commonly known as: LEXAPRO Take 1 tablet (10 mg total) by mouth daily.   ferrous sulfate 325 (65 FE) MG tablet Take 1 tablet (325 mg total) by mouth daily with breakfast.   gabapentin 300 MG  capsule Commonly known as: NEURONTIN Take 1 capsule (300 mg total) by mouth 3 (three) times daily.   hydrochlorothiazide 25 MG tablet Commonly known as: HYDRODIURIL Take 1 tablet (25 mg total) by mouth daily.   Jardiance 25 MG Tabs tablet Generic drug: empagliflozin Take 25 mg by mouth daily before breakfast.   lisinopril 40 MG tablet Commonly known as: ZESTRIL TAKE ONE (1) TABLET EACH DAY What changed:   how much to take  how to take this  when to take this  additional instructions   metFORMIN 1000 MG tablet Commonly known as: GLUCOPHAGE Take 1 tablet (1,000 mg total) by mouth 2 (two) times daily with a meal.   methocarbamol 500 MG tablet Commonly known as: ROBAXIN Take 1 tablet (500 mg total) by mouth every 6 (six) hours as needed for muscle spasms.   metoprolol tartrate 25 MG tablet Commonly known as: LOPRESSOR Take 1 tablet (25 mg total) by mouth 2 (two) times daily.   montelukast 10 MG tablet Commonly known as: SINGULAIR TAKE ONE TABLET DAILY AT BEDTIME   oxyCODONE 15 MG immediate release tablet Commonly known as: ROXICODONE Take 15 mg by mouth every 4 (four) hours as needed for pain.   pantoprazole 40 MG tablet Commonly known as: PROTONIX Take 1 tablet (40 mg total) by mouth daily.   pravastatin 40 MG tablet Commonly known as: PRAVACHOL Take 1 tablet (40 mg total) by mouth every evening.   [name] 100 MG tablet Commonly known as: Januvia Take 1 tablet (100 mg total) by mouth daily.        PATIENT EDUCATION & FOLLOW-UP PLAN  An appointment for Transitional Care Management is scheduled with Onyeje M. Christiana Pellant, FNP on Tuesday 06/30/20 at 2:00pm.  Take all medications as prescribed  Contact our office by calling [phone] if you have any questions or concerns

## 2020-06-22 NOTE — Discharge Summary (Signed)
Physician Discharge Summary  Omar Todd WNU:[id number] DOB: [birth year] DOA: 06/18/2020  PCP: Omar Norlander, DO  Admit date: 06/18/2020 Discharge date: 06/22/2020  Admitted From: Home  Discharge disposition: Home with home health   Recommendations for Outpatient Follow-Up:   . Follow up with your primary care provider in one week.  . Check CBC, BMP, urine and magnesium in the next visit . Follow-up with orthopedics Dr. Sharol Given in 1 week for wound evaluation status post amputation.  Discharge Diagnosis:   Principal Problem:   Right BKA infection ([place]) Active Problems:   Hyperlipidemia associated with type 2 diabetes mellitus ([place])   Hypertension associated with diabetes ([place])   Normocytic anemia   Pressure injury of skin   Hyperglycemia due to diabetes mellitus ([place])   Thrombocytosis ([place])   Discharge Condition: Improved.  Diet recommendation: Low sodium, heart healthy.   Wound care: As per home health  Code status: Full.   History of Present Illness:   [name] is a 69 y.o. male with medical history significant for hypertension, hyperlipidemia, COPD, AAA and recent BKA on June 27 by Dr. Sharol Given in [place] who presented  to the emergency department due to several days of drainage, bleeding and wound infection to the right stump.  Wife was at bedside and provided some of the history.  Patient saw the orthopedic surgeon (Dr. Sharol Given) about 5 days ago, at that time, patient had mild drainage from the wound which was considered to be normal per wife at bedside, he was prescribed with an antibiotic (patient cannot remember the name of the antibiotic) and he stated that he has been compliant with the antibiotics.  Patient also had a follow-up with his PCP 2 days ago and the wound was said to be healing well.  Wife states that the wound opened up more for one day and there was bleeding, drainage and foul-smelling discharge, he contacted his nurse online who asked him to go  to the ED for further evaluation and management.  In the emergency department, he was hemodynamically stable.  Work-up in the ED showed normocytic anemia, thrombocytosis and hyperglycemia.  SARS coronavirus was negative.  Right knee x-ray showed postsurgical change without acute abnormality.  Patient was started on IV vancomycin and cefepime.  IV hydration was provided.  Orthopedic surgeon (Dr. Sharol Given) was consulted.  Patient was then admitted to hospital for further evaluation and treatment.    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    On wound VAC to be continued for 1 week.  Off antibiotics.  Patient has been seen by physical therapy recommend skilled nursing facility but patient is adamant about home health on discharge.  Ortho recommends nonweightbearing on the right lower extremity.    Patient will need to follow-up with orthopedics as outpatient in 1 week.   Hyperglycemia secondary to type II diabetes mellitus   Patient will resume his home medications on discharge.   Thrombocytosis possibly reactive  Platelets 394;  monitor CBC.   Essential hypertension  resume metoprolol,, hydrochlorothiazide and lisinopril from home.   Normocytic anemia with mild acute blood loss anemia  hemoglobin of 7.5 prior to discharge.   COPD  Remained compensated.   Bilateral buttocks stage II pressure ulceration, present on admission.  Continue wound care after discharge.  Disposition.  At this  time, patient is stable for disposition with home health services.  Medical Consultants:    Orthopedics Dr. Sharol Given  Procedures:    Right above-knee amputation by Dr Sharol Given on 06/19/20.  Subjective:   Today, patient feels better.  Wants to go home.  Denies any overt pain, nausea, vomiting abdominal pain, fever chills or  rigor.  Discharge Exam:   Vitals:   06/22/20 0457 06/22/20 0857  BP: (!) 144/87 (!) 139/62  Pulse: 78 75  Resp: 16 18  Temp: 98.6 F (37 C) 98.8 F (37.1 C)  SpO2: 96% 100%   Vitals:   06/21/20 1718 06/21/20 2049 06/22/20 0457 06/22/20 0857  BP: (!) 169/67 (!) 144/62 (!) 144/87 (!) 139/62  Pulse: 85 78 78 75  Resp: 18 18 16 18   Temp: 98.7 F (37.1 C) 99.5 F (37.5 C) 98.6 F (37 C) 98.8 F (37.1 C)  TempSrc: Oral Oral Oral Oral  SpO2: 97% 97% 96% 100%  Weight:      Height:       General: Alert awake, not in obvious distress HENT: pupils equally reacting to light, mild pallor noted.. Oral mucosa is moist.  Chest:  Clear breath sounds.  Diminished breath sounds bilaterally. No crackles or wheezes.  CVS: S1 &S2 heard. No murmur.  Regular rate and rhythm. Abdomen: Soft, nontender, nondistended.  Bowel sounds are heard.   Extremities: Right above-knee amputation with wound VAC and dressing. Psych: Alert, awake and oriented, normal mood CNS:  No cranial nerve deficits.  Power equal in all extremities.   Skin: Warm and dry.  Right elbow knee amputation.  The results of significant diagnostics from this hospitalization (including imaging, microbiology, ancillary and laboratory) are listed below for reference.     Diagnostic Studies:   DG Knee 1-2 Views Right  Result Date: 06/18/2020 CLINICAL DATA:  History of recent BKA with drainage from the surgical site EXAM: RIGHT KNEE - 1-2 VIEW COMPARISON:  05/16/2019 FINDINGS: Changes of recent below the knee amputation are seen. Multiple surgical staples are noted. No open wound is seen. No bony erosive changes are noted. IMPRESSION: Postsurgical change without acute abnormality. Electronically Signed   By: Inez Catalina M.D.   On: 06/18/2020 03:04     Labs:   Basic Metabolic Panel: Recent Labs  Lab 06/18/20 0216 06/18/20 0216 06/20/20 0427 06/20/20 0427 06/21/20 0507 06/22/20 0650  NA 138  --  137  --  138 136  K 3.9   <  > 4.3   < > 3.9 4.6  CL 102  --  104  --  105 103  CO2 27  --  27  --  25 26  GLUCOSE 255*  --  192*  --  175* 170*  BUN 22  --  10  --  8 10  CREATININE 0.71  --  0.70  --  0.64 0.66  CALCIUM 9.1  --  8.5*  --  8.6* 8.7*  MG  --   --  1.9  --   --   --   PHOS  --   --  3.4  --   --   --    < > = values in this interval not displayed.   GFR Estimated Creatinine Clearance: 96.1 mL/min (by C-G formula based on SCr of 0.66 mg/dL). Liver Function Tests: Recent Labs  Lab 06/20/20 0427  AST 9*  ALT 10  ALKPHOS 67  BILITOT 0.4  PROT 5.6*  ALBUMIN 2.1*   No results  for input(s): LIPASE, AMYLASE in the last 168 hours. No results for input(s): AMMONIA in the last 168 hours. Coagulation profile Recent Labs  Lab 06/18/20 0216  INR 1.0    CBC: Recent Labs  Lab 06/18/20 0216 06/20/20 0427 06/21/20 0507 06/22/20 0650  WBC 9.0 10.1 8.0 7.6  NEUTROABS 6.5  --   --   --   HGB 8.5* 7.1* 7.2* 7.5*  HCT 29.3* 24.9* 25.2* 26.1*  MCV 80.3 80.3 79.2* 80.1  PLT 480* 385 394 394   Cardiac Enzymes: No results for input(s): CKTOTAL, CKMB, CKMBINDEX, TROPONINI in the last 168 hours. BNP: Invalid input(s): POCBNP CBG: Recent Labs  Lab 06/21/20 0659 06/21/20 1117 06/21/20 1716 06/21/20 2051 06/22/20 0615  GLUCAP 172* 195* 164* 126* 168*   D-Dimer No results for input(s): DDIMER in the last 72 hours. Hgb A1c No results for input(s): HGBA1C in the last 72 hours. Lipid Profile No results for input(s): CHOL, HDL, LDLCALC, TRIG, CHOLHDL, LDLDIRECT in the last 72 hours. Thyroid function studies No results for input(s): TSH, T4TOTAL, T3FREE, THYROIDAB in the last 72 hours.  Invalid input(s): FREET3 Anemia work up No results for input(s): VITAMINB12, FOLATE, FERRITIN, TIBC, IRON, RETICCTPCT in the last 72 hours. Microbiology Recent Results (from the past 240 hour(s))  SARS Coronavirus 2 by RT PCR (hospital order, performed in [facility] hospital lab) Nasopharyngeal Nasopharyngeal  Swab     Status: None   Collection Time: 06/18/20  2:15 AM   Specimen: Nasopharyngeal Swab  Result Value Ref Range Status   SARS Coronavirus 2 NEGATIVE NEGATIVE Final    Comment: (NOTE) SARS-CoV-2 target nucleic acids are NOT DETECTED.  The SARS-CoV-2 RNA is generally detectable in upper and lower respiratory specimens during the acute phase of infection. The lowest concentration of SARS-CoV-2 viral copies this assay can detect is 250 copies / mL. A negative result does not preclude SARS-CoV-2 infection and should not be used as the sole basis for treatment or other patient management decisions.  A negative result may occur with improper specimen collection / handling, submission of specimen other than nasopharyngeal swab, presence of viral mutation(s) within the areas targeted by this assay, and inadequate number of viral copies (<250 copies / mL). A negative result must be combined with clinical observations, patient history, and epidemiological information.  Fact Sheet for Patients:   StrictlyIdeas.no  Fact Sheet for Healthcare Providers: BankingDealers.co.za  This test is not yet approved or  cleared by the [place] FDA and has been authorized for detection and/or diagnosis of SARS-CoV-2 by FDA under an Emergency Use Authorization (EUA).  This EUA will remain in effect (meaning this test can be used) for the duration of the COVID-19 declaration under Section 564(b)(1) of the Act, 21 U.S.C. section 360bbb-3(b)(1), unless the authorization is terminated or revoked sooner.  Performed at [facility], [address] [place], [place] [address]   Blood culture (routine x 2)     Status: None (Preliminary result)   Collection Time: 06/18/20  2:18 AM   Specimen: BLOOD RIGHT FOREARM  Result Value Ref Range Status   Specimen Description BLOOD RIGHT FOREARM  Final   Special Requests   Final    BOTTLES DRAWN AEROBIC AND ANAEROBIC  Blood Culture adequate volume   Culture   Final    NO GROWTH 4 DAYS Performed at [facility], [address] [place], [place] [address]    Report Status PENDING  Incomplete  Blood culture (routine x 2)  Status: None (Preliminary result)   Collection Time: 06/18/20  2:26 AM   Specimen: Left Antecubital; Blood  Result Value Ref Range Status   Specimen Description LEFT ANTECUBITAL  Final   Special Requests   Final    BOTTLES DRAWN AEROBIC AND ANAEROBIC Blood Culture adequate volume   Culture   Final    NO GROWTH 4 DAYS Performed at [facility], [address], [place], [place] [address]    Report Status PENDING  Incomplete     Discharge Instructions:   Discharge Instructions     Call MD for:  persistant nausea and vomiting   Complete by: As directed    Call MD for:  severe uncontrolled pain   Complete by: As directed    Call MD for:  temperature >100.4   Complete by: As directed    Diet - low sodium heart healthy   Complete by: As directed    Discharge instructions   Complete by: As directed    Follow-up with your primary care physician in 1-2 weeks.  Follow-up with orthopedics Dr. Sharol Given in 1 week for for surgical follow-up.  Continue wound care with home health   Discharge wound care:   Complete by: As directed    As per home health   Increase activity slowly   Complete by: As directed    Negative Pressure Wound Therapy - Incisional   Complete by: As directed    Show patient how to attach prevena vac      Allergies as of 06/22/2020       Reactions   Penicillins Other (See Comments)   Pt. States he "passed out"        Medication List     STOP taking these medications    doxycycline 100 MG tablet Commonly known as: VIBRA-TABS       TAKE these medications    amLODipine 10 MG tablet Commonly known as: NORVASC Take 1 tablet (10 mg total) by mouth daily.   aspirin EC 81 MG tablet Take 1 tablet (81 mg total) by mouth daily with breakfast.   BIOTIN  5000 PO Take 5,000 mcg by mouth daily.   cholecalciferol 1000 units tablet Commonly known as: VITAMIN D Take 1,000 Units by mouth daily.   docusate sodium 100 MG capsule Commonly known as: COLACE Take 1 capsule (100 mg total) by mouth 2 (two) times daily.   escitalopram 10 MG tablet Commonly known as: LEXAPRO Take 1 tablet (10 mg total) by mouth daily.   ferrous sulfate 325 (65 FE) MG tablet Take 1 tablet (325 mg total) by mouth daily with breakfast.   gabapentin 300 MG capsule Commonly known as: NEURONTIN Take 1 capsule (300 mg total) by mouth 3 (three) times daily.   hydrochlorothiazide 25 MG tablet Commonly known as: HYDRODIURIL Take 1 tablet (25 mg total) by mouth daily.   Jardiance 25 MG Tabs tablet Generic drug: empagliflozin Take 25 mg by mouth daily before breakfast.   lisinopril 40 MG tablet Commonly known as: ZESTRIL TAKE ONE (1) TABLET EACH DAY What changed:   how much to take  how to take this  when to take this  additional instructions   metFORMIN 1000 MG tablet Commonly known as: GLUCOPHAGE Take 1 tablet (1,000 mg total) by mouth 2 (two) times daily with a meal.   methocarbamol 500 MG tablet Commonly known as: ROBAXIN Take 1 tablet (500 mg total) by mouth every 6 (six) hours as needed for muscle spasms.   metoprolol  tartrate 25 MG tablet Commonly known as: LOPRESSOR Take 1 tablet (25 mg total) by mouth 2 (two) times daily.   montelukast 10 MG tablet Commonly known as: SINGULAIR TAKE ONE TABLET DAILY AT BEDTIME   oxyCODONE 15 MG immediate release tablet Commonly known as: ROXICODONE Take 15 mg by mouth every 4 (four) hours as needed for pain.   pantoprazole 40 MG tablet Commonly known as: PROTONIX Take 1 tablet (40 mg total) by mouth daily.   pravastatin 40 MG tablet Commonly known as: PRAVACHOL Take 1 tablet (40 mg total) by mouth every evening.   [name] 100 MG tablet Commonly known as: Januvia Take 1 tablet (100 mg total) by  mouth daily.               Discharge Care Instructions  (From admission, onward)           Start     Ordered   06/22/20 0000  Discharge wound care:       Comments: As per home health   06/22/20 1053            Follow-up Information     [name], NP. Schedule an appointment as soon as possible for a visit in 1 week(s).   Specialty: Orthopedic Surgery Why: for wound follow up Contact information: [place] [address] [place], [place] Follow up.   Why: a representative from [facility] will contact you to resume your Home Health services. Contact information: [place] [address] [place], [place], DO. Schedule an appointment as soon as possible for a visit in 1 week(s).   Specialty: Family Medicine Why: for regular followup, blood work Contact information: [place] [address] [phone]                  Time coordinating discharge: 39 minutes  Signed:  Lin Hackmann  [facility] 06/22/2020, 10:54 AM

## 2020-06-22 NOTE — TOC Progression Note (Signed)
Transition of Care [facility]) - Progression Note    Patient Details  Name: Omar Todd MRN: [id number] Date of Birth: 1951/05/08  Transition of [facility]) CM/SW Contact  Bartholomew Crews, RN Phone Number: [phone] 06/22/2020, 10:49 AM  Clinical Narrative:     Spoke with patient at the bedside to confirm his desire to return home with home health services.   Patient is currently active with [facility] for RN, PT - he will need HH orders for resumption of services. Patient will transition home with prevena wound vac.   Patient states that he has Medicaid transportation for medical appointments.   He has a wheelchair and a walker. He has a Industrial/product designer and his sister who assist him as needed with meals, transportation, and medication. He states that his sister sets up his pill box to help him to take his medications as directed.   He uses [employer].   He states that his neighbor will pick him up at discharge, and that he is able to get in and out of private vehicle.   He states that his PCP is in [place], [place].   No further TOC needs identified.   Expected Discharge Plan: [place] Barriers to Discharge: No Barriers Identified  Expected Discharge Plan and Services Expected Discharge Plan: [place] arrangements for the past 2 months: Mobile Home                                       Social Determinants of Health (SDOH) Interventions    Readmission Risk Interventions Readmission Risk Prevention Plan 05/18/2020  Transportation Screening Complete  PCP or Specialist Appt within 5-7 Days Not Complete  Not Complete comments plan for SNF  PCP or Specialist Appt within 3-5 Days Not Complete  Not Complete comments plan for SNF  Home Care Screening Complete  Medication Review (RN CM) Referral to Pharmacy  HRI or [place] Complete  Social Work Consult for [place] Planning/Counseling Complete   Palliative Care Screening Not Complete  Palliative Care Screening Not Complete Comments could be appropriate  Medication Review (RN Transport planner) Referral to Pharmacy  Some recent data might be hidden

## 2020-06-22 NOTE — Progress Notes (Signed)
DISCHARGE NOTE HOME Omar Todd to be discharged Home per MD order. Discussed prescriptions and follow up appointments with the patient. Prescriptions given to patient; medication list explained in detail. Patient verbalized understanding.  Skin clean, dry and intact without evidence of skin break down, no evidence of skin tears noted. IV catheter discontinued intact. Site without signs and symptoms of complications. Dressing and pressure applied. Pt denies pain at the site currently. No complaints noted.  Patient free of lines, drains, and wounds.   An After Visit Summary (AVS) was printed and given to the patient. Patient escorted via wheelchair, and discharged home via private auto.  Arlyss Repress, RN

## 2020-06-23 ENCOUNTER — Telehealth: Payer: Self-pay

## 2020-06-23 DIAGNOSIS — E43 Unspecified severe protein-calorie malnutrition: Secondary | ICD-10-CM | POA: Diagnosis not present

## 2020-06-23 DIAGNOSIS — I2510 Atherosclerotic heart disease of native coronary artery without angina pectoris: Secondary | ICD-10-CM | POA: Diagnosis not present

## 2020-06-23 DIAGNOSIS — D519 Vitamin B12 deficiency anemia, unspecified: Secondary | ICD-10-CM | POA: Diagnosis not present

## 2020-06-23 DIAGNOSIS — D509 Iron deficiency anemia, unspecified: Secondary | ICD-10-CM | POA: Diagnosis not present

## 2020-06-23 DIAGNOSIS — K2950 Unspecified chronic gastritis without bleeding: Secondary | ICD-10-CM | POA: Diagnosis not present

## 2020-06-23 DIAGNOSIS — I70229 Atherosclerosis of native arteries of extremities with rest pain, unspecified extremity: Secondary | ICD-10-CM | POA: Diagnosis not present

## 2020-06-23 DIAGNOSIS — L8990 Pressure ulcer of unspecified site, unspecified stage: Secondary | ICD-10-CM | POA: Diagnosis not present

## 2020-06-23 DIAGNOSIS — I119 Hypertensive heart disease without heart failure: Secondary | ICD-10-CM | POA: Diagnosis not present

## 2020-06-23 DIAGNOSIS — E1140 Type 2 diabetes mellitus with diabetic neuropathy, unspecified: Secondary | ICD-10-CM | POA: Diagnosis not present

## 2020-06-23 DIAGNOSIS — E1165 Type 2 diabetes mellitus with hyperglycemia: Secondary | ICD-10-CM | POA: Diagnosis not present

## 2020-06-23 DIAGNOSIS — J449 Chronic obstructive pulmonary disease, unspecified: Secondary | ICD-10-CM | POA: Diagnosis not present

## 2020-06-23 DIAGNOSIS — E1151 Type 2 diabetes mellitus with diabetic peripheral angiopathy without gangrene: Secondary | ICD-10-CM | POA: Diagnosis not present

## 2020-06-23 DIAGNOSIS — Z4781 Encounter for orthopedic aftercare following surgical amputation: Secondary | ICD-10-CM | POA: Diagnosis not present

## 2020-06-23 LAB — CULTURE, BLOOD (ROUTINE X 2)
Culture: NO GROWTH
Culture: NO GROWTH
Special Requests: ADEQUATE
Special Requests: ADEQUATE

## 2020-06-23 NOTE — Care Management Important Message (Signed)
Important Message  Patient Details  Name: Omar Todd MRN: [id number] Date of Birth: 08/24/1951   Medicare Important Message Given:  Yes Patient left prior to IM delivery.  IM mailed to the patient address.      Baldwin Racicot 06/23/2020, 3:59 PM

## 2020-06-23 NOTE — Telephone Encounter (Signed)
[facility] called and states that the pt's vac is beeping. The pt is s/p an AKA on 06/19/20 he disconnected the vac last night did not charge and tried to put back together today without success. I advised to remove the dressing today and we will see the pt in the office tomorrow. Will call with any questions.

## 2020-06-24 ENCOUNTER — Encounter: Payer: Self-pay | Admitting: Family

## 2020-06-24 ENCOUNTER — Ambulatory Visit (INDEPENDENT_AMBULATORY_CARE_PROVIDER_SITE_OTHER): Payer: Medicare Other | Admitting: Family

## 2020-06-24 VITALS — Ht 74.0 in | Wt 172.0 lb

## 2020-06-24 DIAGNOSIS — Z89611 Acquired absence of right leg above knee: Secondary | ICD-10-CM

## 2020-06-24 NOTE — Progress Notes (Signed)
Post-Op Visit Note   Patient: Omar Todd           Date of Birth: Dec 02, 1950           MRN: [id number] Visit Date: 06/24/2020 PCP: [name], DO  Chief Complaint:  Chief Complaint  Patient presents with  . Right Leg - Routine Post Op    06/19/20 right AKA     HPI:  HPI The patient is a 69 year old gentleman seen today status post right above-knee amputation on July 30.  Home health nursing has discontinued his wound VAC.  There was no drainage in the canister.  A dry dressing is in place  Ortho Exam  Incision is clean dry and intact sutures and staples in place there is mild edema no drainage  Visit Diagnoses: No diagnosis found.  Plan: He will begin daily Dial soap cleansing dry dressing changes.  Follow-up in the office in 2 weeks for suture and staple removal  Follow-Up Instructions: No follow-ups on file.   Imaging: No results found.  Orders:  No orders of the defined types were placed in this encounter.  No orders of the defined types were placed in this encounter.    PMFS History: Patient Active Problem List   Diagnosis Date Noted  . Right BKA infection ([place]) 06/18/2020  . Hyperglycemia due to diabetes mellitus ([place]) 06/18/2020  . Thrombocytosis ([place]) 06/18/2020  . Pressure injury of skin 05/16/2020  . Severe protein-calorie malnutrition ([place])   . Gangrene of right foot ([place]) 03/26/2020  . PAD (peripheral artery disease) ([place]) 03/26/2020  . Normocytic anemia 03/12/2020  . GI bleed 03/12/2020  . AAA (abdominal aortic aneurysm) without rupture ([place]) 02/28/2020  . Critical lower limb ischemia 02/28/2020  . BPH (benign prostatic hyperplasia) 10/31/2014  . Systolic ejection murmur 52/84/1324  . Bilateral carotid bruits 10/31/2014  . Erectile dysfunction 11/05/2013  . Bilateral carotid artery disease ([place]) 04/11/2013  . Hyperlipidemia associated with type 2 diabetes mellitus ([place]) 04/11/2013  . Hypertension associated with diabetes ([place])  04/11/2013  . COPD exacerbation ([place]) 04/11/2013  . Diabetes mellitus type 2 controlled 04/11/2013  . Diabetic neuropathy, type II diabetes mellitus ([place]) 03/15/2013   Past Medical History:  Diagnosis Date  . COPD (chronic obstructive pulmonary disease) ([place])   . Diabetes mellitus without complication ([place])   . Gangrene of toe of right foot ([place])   . GERD (gastroesophageal reflux disease)   . Hyperlipidemia   . Hypertension     Family History  Problem Relation Age of Onset  . Cancer Father   . Diabetes Sister   . Stroke Brother   . Healthy Sister   . Healthy Sister   . Colon cancer Neg Hx   . Gastric cancer Neg Hx     Past Surgical History:  Procedure Laterality Date  . AMPUTATION Right 05/17/2020   Procedure: AMPUTATION BELOW RIGHT KNEE;  Surgeon: Newt Minion, MD;  Location: [facility];  Service: Orthopedics;  Laterality: Right;  . AMPUTATION Right 06/19/2020   Procedure: RIGHT ABOVE KNEE AMPUTATION;  Surgeon: Newt Minion, MD;  Location: [facility];  Service: Orthopedics;  Laterality: Right;  . APPLICATION OF WOUND VAC Right 05/17/2020   Procedure: APPLICATION OF WOUND VAC;  Surgeon: Newt Minion, MD;  Location: [facility];  Service: Orthopedics;  Laterality: Right;  . BIOPSY  03/13/2020   Procedure: BIOPSY;  Surgeon: Daneil Dolin, MD;  [facility];  Service: Endoscopy;;  gastric biopsy  . ESOPHAGOGASTRODUODENOSCOPY  N/A 03/13/2020   Procedure: ESOPHAGOGASTRODUODENOSCOPY (EGD);  Surgeon: Daneil Dolin, MD;  [facility];  Service: Endoscopy;  Laterality: N/A;  . FLEXIBLE SIGMOIDOSCOPY N/A 05/06/2020   Procedure: FLEXIBLE SIGMOIDOSCOPY;  Surgeon: Daneil Dolin, MD;  [facility];  Service: Endoscopy;  Laterality: N/A;  . Thumb surgery     Social History   Occupational History  . Occupation: Retired    Comment: Science writer  Tobacco Use  . Smoking status: Current Every Day Smoker    Packs/day: 0.50    Years: 60.00    Pack years:  30.00    Types: Cigarettes  . Smokeless tobacco: Never Used  Vaping Use  . Vaping Use: Former  Substance and Sexual Activity  . Alcohol use: No    Alcohol/week: 0.0 standard drinks  . Drug use: No  . Sexual activity: Yes    Birth control/protection: None

## 2020-06-25 NOTE — Progress Notes (Signed)
N.T found a consumed cigarette smoke in a drinking cup with a top lid on it,apparently on my impression the patient smoked last night as it was smelled by other staff.Reminded patient smoking is not allowed for a reason of safety but patient denied and at thesatime become verbally abusive on this Probation officer.Charge made aware.Paged security and found a lighter in his personal belonging but no cigarette found.Will monitor.

## 2020-06-26 ENCOUNTER — Inpatient Hospital Stay: Payer: Medicare Other | Admitting: Family

## 2020-06-26 DIAGNOSIS — I119 Hypertensive heart disease without heart failure: Secondary | ICD-10-CM | POA: Diagnosis not present

## 2020-06-26 DIAGNOSIS — D509 Iron deficiency anemia, unspecified: Secondary | ICD-10-CM | POA: Diagnosis not present

## 2020-06-26 DIAGNOSIS — E1165 Type 2 diabetes mellitus with hyperglycemia: Secondary | ICD-10-CM | POA: Diagnosis not present

## 2020-06-26 DIAGNOSIS — Z4781 Encounter for orthopedic aftercare following surgical amputation: Secondary | ICD-10-CM | POA: Diagnosis not present

## 2020-06-26 DIAGNOSIS — E1140 Type 2 diabetes mellitus with diabetic neuropathy, unspecified: Secondary | ICD-10-CM | POA: Diagnosis not present

## 2020-06-26 DIAGNOSIS — K2950 Unspecified chronic gastritis without bleeding: Secondary | ICD-10-CM | POA: Diagnosis not present

## 2020-06-26 DIAGNOSIS — E1151 Type 2 diabetes mellitus with diabetic peripheral angiopathy without gangrene: Secondary | ICD-10-CM | POA: Diagnosis not present

## 2020-06-26 DIAGNOSIS — E43 Unspecified severe protein-calorie malnutrition: Secondary | ICD-10-CM | POA: Diagnosis not present

## 2020-06-26 DIAGNOSIS — J449 Chronic obstructive pulmonary disease, unspecified: Secondary | ICD-10-CM | POA: Diagnosis not present

## 2020-06-26 DIAGNOSIS — D519 Vitamin B12 deficiency anemia, unspecified: Secondary | ICD-10-CM | POA: Diagnosis not present

## 2020-06-26 DIAGNOSIS — I2510 Atherosclerotic heart disease of native coronary artery without angina pectoris: Secondary | ICD-10-CM | POA: Diagnosis not present

## 2020-06-26 DIAGNOSIS — I70229 Atherosclerosis of native arteries of extremities with rest pain, unspecified extremity: Secondary | ICD-10-CM | POA: Diagnosis not present

## 2020-06-26 DIAGNOSIS — L8990 Pressure ulcer of unspecified site, unspecified stage: Secondary | ICD-10-CM | POA: Diagnosis not present

## 2020-06-27 DIAGNOSIS — K2950 Unspecified chronic gastritis without bleeding: Secondary | ICD-10-CM | POA: Diagnosis not present

## 2020-06-27 DIAGNOSIS — D519 Vitamin B12 deficiency anemia, unspecified: Secondary | ICD-10-CM | POA: Diagnosis not present

## 2020-06-27 DIAGNOSIS — E43 Unspecified severe protein-calorie malnutrition: Secondary | ICD-10-CM | POA: Diagnosis not present

## 2020-06-27 DIAGNOSIS — Z4781 Encounter for orthopedic aftercare following surgical amputation: Secondary | ICD-10-CM | POA: Diagnosis not present

## 2020-06-27 DIAGNOSIS — D509 Iron deficiency anemia, unspecified: Secondary | ICD-10-CM | POA: Diagnosis not present

## 2020-06-27 DIAGNOSIS — E1165 Type 2 diabetes mellitus with hyperglycemia: Secondary | ICD-10-CM | POA: Diagnosis not present

## 2020-06-27 DIAGNOSIS — W19XXXA Unspecified fall, initial encounter: Secondary | ICD-10-CM | POA: Diagnosis not present

## 2020-06-27 DIAGNOSIS — R58 Hemorrhage, not elsewhere classified: Secondary | ICD-10-CM | POA: Diagnosis not present

## 2020-06-27 DIAGNOSIS — E1151 Type 2 diabetes mellitus with diabetic peripheral angiopathy without gangrene: Secondary | ICD-10-CM | POA: Diagnosis not present

## 2020-06-27 DIAGNOSIS — I70229 Atherosclerosis of native arteries of extremities with rest pain, unspecified extremity: Secondary | ICD-10-CM | POA: Diagnosis not present

## 2020-06-27 DIAGNOSIS — L8990 Pressure ulcer of unspecified site, unspecified stage: Secondary | ICD-10-CM | POA: Diagnosis not present

## 2020-06-27 DIAGNOSIS — E1140 Type 2 diabetes mellitus with diabetic neuropathy, unspecified: Secondary | ICD-10-CM | POA: Diagnosis not present

## 2020-06-27 DIAGNOSIS — I119 Hypertensive heart disease without heart failure: Secondary | ICD-10-CM | POA: Diagnosis not present

## 2020-06-27 DIAGNOSIS — J449 Chronic obstructive pulmonary disease, unspecified: Secondary | ICD-10-CM | POA: Diagnosis not present

## 2020-06-27 DIAGNOSIS — R410 Disorientation, unspecified: Secondary | ICD-10-CM | POA: Diagnosis not present

## 2020-06-27 DIAGNOSIS — I2510 Atherosclerotic heart disease of native coronary artery without angina pectoris: Secondary | ICD-10-CM | POA: Diagnosis not present

## 2020-06-28 DIAGNOSIS — R5381 Other malaise: Secondary | ICD-10-CM | POA: Diagnosis not present

## 2020-06-28 DIAGNOSIS — W19XXXA Unspecified fall, initial encounter: Secondary | ICD-10-CM | POA: Diagnosis not present

## 2020-06-28 DIAGNOSIS — R69 Illness, unspecified: Secondary | ICD-10-CM | POA: Diagnosis not present

## 2020-06-29 ENCOUNTER — Encounter: Payer: Self-pay | Admitting: Family Medicine

## 2020-06-29 ENCOUNTER — Ambulatory Visit: Payer: Medicare Other | Admitting: *Deleted

## 2020-06-29 DIAGNOSIS — E1165 Type 2 diabetes mellitus with hyperglycemia: Secondary | ICD-10-CM

## 2020-06-29 DIAGNOSIS — L8990 Pressure ulcer of unspecified site, unspecified stage: Secondary | ICD-10-CM | POA: Diagnosis not present

## 2020-06-29 DIAGNOSIS — I70229 Atherosclerosis of native arteries of extremities with rest pain, unspecified extremity: Secondary | ICD-10-CM | POA: Diagnosis not present

## 2020-06-29 DIAGNOSIS — Z4781 Encounter for orthopedic aftercare following surgical amputation: Secondary | ICD-10-CM | POA: Diagnosis not present

## 2020-06-29 DIAGNOSIS — J449 Chronic obstructive pulmonary disease, unspecified: Secondary | ICD-10-CM | POA: Diagnosis not present

## 2020-06-29 DIAGNOSIS — E43 Unspecified severe protein-calorie malnutrition: Secondary | ICD-10-CM | POA: Diagnosis not present

## 2020-06-29 DIAGNOSIS — Z89611 Acquired absence of right leg above knee: Secondary | ICD-10-CM

## 2020-06-29 DIAGNOSIS — D509 Iron deficiency anemia, unspecified: Secondary | ICD-10-CM | POA: Diagnosis not present

## 2020-06-29 DIAGNOSIS — I2510 Atherosclerotic heart disease of native coronary artery without angina pectoris: Secondary | ICD-10-CM | POA: Diagnosis not present

## 2020-06-29 DIAGNOSIS — E1151 Type 2 diabetes mellitus with diabetic peripheral angiopathy without gangrene: Secondary | ICD-10-CM | POA: Diagnosis not present

## 2020-06-29 DIAGNOSIS — D519 Vitamin B12 deficiency anemia, unspecified: Secondary | ICD-10-CM | POA: Diagnosis not present

## 2020-06-29 DIAGNOSIS — E1140 Type 2 diabetes mellitus with diabetic neuropathy, unspecified: Secondary | ICD-10-CM | POA: Diagnosis not present

## 2020-06-29 DIAGNOSIS — Z91199 Patient's noncompliance with other medical treatment and regimen due to unspecified reason: Secondary | ICD-10-CM

## 2020-06-29 DIAGNOSIS — I119 Hypertensive heart disease without heart failure: Secondary | ICD-10-CM | POA: Diagnosis not present

## 2020-06-29 DIAGNOSIS — K2950 Unspecified chronic gastritis without bleeding: Secondary | ICD-10-CM | POA: Diagnosis not present
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
## 2020-06-30 ENCOUNTER — Ambulatory Visit: Payer: Medicare Other | Admitting: Nurse Practitioner

## 2020-07-01 ENCOUNTER — Encounter: Payer: Self-pay | Admitting: Nurse Practitioner

## 2020-07-01 ENCOUNTER — Ambulatory Visit: Payer: Medicare Other | Admitting: Nurse Practitioner

## 2020-07-01 DIAGNOSIS — E43 Unspecified severe protein-calorie malnutrition: Secondary | ICD-10-CM | POA: Diagnosis not present

## 2020-07-01 DIAGNOSIS — K2950 Unspecified chronic gastritis without bleeding: Secondary | ICD-10-CM | POA: Diagnosis not present

## 2020-07-01 DIAGNOSIS — E1140 Type 2 diabetes mellitus with diabetic neuropathy, unspecified: Secondary | ICD-10-CM | POA: Diagnosis not present

## 2020-07-01 DIAGNOSIS — L8990 Pressure ulcer of unspecified site, unspecified stage: Secondary | ICD-10-CM | POA: Diagnosis not present

## 2020-07-01 DIAGNOSIS — I119 Hypertensive heart disease without heart failure: Secondary | ICD-10-CM | POA: Diagnosis not present

## 2020-07-01 DIAGNOSIS — E1151 Type 2 diabetes mellitus with diabetic peripheral angiopathy without gangrene: Secondary | ICD-10-CM | POA: Diagnosis not present

## 2020-07-01 DIAGNOSIS — I2510 Atherosclerotic heart disease of native coronary artery without angina pectoris: Secondary | ICD-10-CM | POA: Diagnosis not present

## 2020-07-01 DIAGNOSIS — E1165 Type 2 diabetes mellitus with hyperglycemia: Secondary | ICD-10-CM | POA: Diagnosis not present

## 2020-07-01 DIAGNOSIS — D519 Vitamin B12 deficiency anemia, unspecified: Secondary | ICD-10-CM | POA: Diagnosis not present

## 2020-07-01 DIAGNOSIS — I70229 Atherosclerosis of native arteries of extremities with rest pain, unspecified extremity: Secondary | ICD-10-CM | POA: Diagnosis not present

## 2020-07-01 DIAGNOSIS — D509 Iron deficiency anemia, unspecified: Secondary | ICD-10-CM | POA: Diagnosis not present

## 2020-07-01 DIAGNOSIS — J449 Chronic obstructive pulmonary disease, unspecified: Secondary | ICD-10-CM | POA: Diagnosis not present

## 2020-07-01 DIAGNOSIS — Z4781 Encounter for orthopedic aftercare following surgical amputation: Secondary | ICD-10-CM | POA: Diagnosis not present

## 2020-07-03 ENCOUNTER — Encounter: Payer: Self-pay | Admitting: Family Medicine

## 2020-07-03 DIAGNOSIS — R5381 Other malaise: Secondary | ICD-10-CM | POA: Diagnosis not present

## 2020-07-03 DIAGNOSIS — D509 Iron deficiency anemia, unspecified: Secondary | ICD-10-CM | POA: Diagnosis not present

## 2020-07-03 DIAGNOSIS — E43 Unspecified severe protein-calorie malnutrition: Secondary | ICD-10-CM | POA: Diagnosis not present

## 2020-07-03 DIAGNOSIS — K2950 Unspecified chronic gastritis without bleeding: Secondary | ICD-10-CM | POA: Diagnosis not present

## 2020-07-03 DIAGNOSIS — I2510 Atherosclerotic heart disease of native coronary artery without angina pectoris: Secondary | ICD-10-CM | POA: Diagnosis not present

## 2020-07-03 DIAGNOSIS — L8990 Pressure ulcer of unspecified site, unspecified stage: Secondary | ICD-10-CM | POA: Diagnosis not present

## 2020-07-03 DIAGNOSIS — I119 Hypertensive heart disease without heart failure: Secondary | ICD-10-CM | POA: Diagnosis not present

## 2020-07-03 DIAGNOSIS — E1151 Type 2 diabetes mellitus with diabetic peripheral angiopathy without gangrene: Secondary | ICD-10-CM | POA: Diagnosis not present

## 2020-07-03 DIAGNOSIS — I70229 Atherosclerosis of native arteries of extremities with rest pain, unspecified extremity: Secondary | ICD-10-CM | POA: Diagnosis not present

## 2020-07-03 DIAGNOSIS — Z4781 Encounter for orthopedic aftercare following surgical amputation: Secondary | ICD-10-CM | POA: Diagnosis not present

## 2020-07-03 DIAGNOSIS — J449 Chronic obstructive pulmonary disease, unspecified: Secondary | ICD-10-CM | POA: Diagnosis not present

## 2020-07-03 DIAGNOSIS — E1140 Type 2 diabetes mellitus with diabetic neuropathy, unspecified: Secondary | ICD-10-CM | POA: Diagnosis not present

## 2020-07-03 DIAGNOSIS — E1165 Type 2 diabetes mellitus with hyperglycemia: Secondary | ICD-10-CM | POA: Diagnosis not present

## 2020-07-03 DIAGNOSIS — R69 Illness, unspecified: Secondary | ICD-10-CM | POA: Diagnosis not present

## 2020-07-03 DIAGNOSIS — D519 Vitamin B12 deficiency anemia, unspecified: Secondary | ICD-10-CM | POA: Diagnosis not present

## 2020-07-05 DIAGNOSIS — D509 Iron deficiency anemia, unspecified: Secondary | ICD-10-CM | POA: Diagnosis not present

## 2020-07-05 DIAGNOSIS — L8990 Pressure ulcer of unspecified site, unspecified stage: Secondary | ICD-10-CM | POA: Diagnosis not present

## 2020-07-05 DIAGNOSIS — E1151 Type 2 diabetes mellitus with diabetic peripheral angiopathy without gangrene: Secondary | ICD-10-CM | POA: Diagnosis not present

## 2020-07-05 DIAGNOSIS — E1140 Type 2 diabetes mellitus with diabetic neuropathy, unspecified: Secondary | ICD-10-CM | POA: Diagnosis not present

## 2020-07-05 DIAGNOSIS — Z4781 Encounter for orthopedic aftercare following surgical amputation: Secondary | ICD-10-CM | POA: Diagnosis not present

## 2020-07-05 DIAGNOSIS — I70229 Atherosclerosis of native arteries of extremities with rest pain, unspecified extremity: Secondary | ICD-10-CM | POA: Diagnosis not present

## 2020-07-05 DIAGNOSIS — K2950 Unspecified chronic gastritis without bleeding: Secondary | ICD-10-CM | POA: Diagnosis not present

## 2020-07-05 DIAGNOSIS — I2510 Atherosclerotic heart disease of native coronary artery without angina pectoris: Secondary | ICD-10-CM | POA: Diagnosis not present

## 2020-07-05 DIAGNOSIS — I119 Hypertensive heart disease without heart failure: Secondary | ICD-10-CM | POA: Diagnosis not present

## 2020-07-05 DIAGNOSIS — J449 Chronic obstructive pulmonary disease, unspecified: Secondary | ICD-10-CM | POA: Diagnosis not present

## 2020-07-05 DIAGNOSIS — D519 Vitamin B12 deficiency anemia, unspecified: Secondary | ICD-10-CM | POA: Diagnosis not present

## 2020-07-05 DIAGNOSIS — E43 Unspecified severe protein-calorie malnutrition: Secondary | ICD-10-CM | POA: Diagnosis not present

## 2020-07-05 DIAGNOSIS — E1165 Type 2 diabetes mellitus with hyperglycemia: Secondary | ICD-10-CM | POA: Diagnosis not present

## 2020-07-06 DIAGNOSIS — I70229 Atherosclerosis of native arteries of extremities with rest pain, unspecified extremity: Secondary | ICD-10-CM | POA: Diagnosis not present

## 2020-07-06 DIAGNOSIS — J449 Chronic obstructive pulmonary disease, unspecified: Secondary | ICD-10-CM | POA: Diagnosis not present

## 2020-07-06 DIAGNOSIS — D519 Vitamin B12 deficiency anemia, unspecified: Secondary | ICD-10-CM | POA: Diagnosis not present

## 2020-07-06 DIAGNOSIS — D509 Iron deficiency anemia, unspecified: Secondary | ICD-10-CM | POA: Diagnosis not present

## 2020-07-06 DIAGNOSIS — E1140 Type 2 diabetes mellitus with diabetic neuropathy, unspecified: Secondary | ICD-10-CM | POA: Diagnosis not present

## 2020-07-06 DIAGNOSIS — K2950 Unspecified chronic gastritis without bleeding: Secondary | ICD-10-CM | POA: Diagnosis not present

## 2020-07-06 DIAGNOSIS — E43 Unspecified severe protein-calorie malnutrition: Secondary | ICD-10-CM | POA: Diagnosis not present

## 2020-07-06 DIAGNOSIS — E1165 Type 2 diabetes mellitus with hyperglycemia: Secondary | ICD-10-CM | POA: Diagnosis not present

## 2020-07-06 DIAGNOSIS — E1151 Type 2 diabetes mellitus with diabetic peripheral angiopathy without gangrene: Secondary | ICD-10-CM | POA: Diagnosis not present

## 2020-07-06 DIAGNOSIS — Z4781 Encounter for orthopedic aftercare following surgical amputation: Secondary | ICD-10-CM | POA: Diagnosis not present

## 2020-07-06 DIAGNOSIS — I119 Hypertensive heart disease without heart failure: Secondary | ICD-10-CM | POA: Diagnosis not present

## 2020-07-06 DIAGNOSIS — I2510 Atherosclerotic heart disease of native coronary artery without angina pectoris: Secondary | ICD-10-CM | POA: Diagnosis not present

## 2020-07-06 DIAGNOSIS — L8990 Pressure ulcer of unspecified site, unspecified stage: Secondary | ICD-10-CM | POA: Diagnosis not present

## 2020-07-08 ENCOUNTER — Ambulatory Visit (INDEPENDENT_AMBULATORY_CARE_PROVIDER_SITE_OTHER): Payer: Medicare Other | Admitting: Family

## 2020-07-08 ENCOUNTER — Encounter: Payer: Self-pay | Admitting: Family

## 2020-07-08 ENCOUNTER — Other Ambulatory Visit: Payer: Self-pay

## 2020-07-08 VITALS — Ht 74.0 in | Wt 172.0 lb

## 2020-07-08 DIAGNOSIS — E1140 Type 2 diabetes mellitus with diabetic neuropathy, unspecified: Secondary | ICD-10-CM | POA: Diagnosis not present

## 2020-07-08 DIAGNOSIS — J449 Chronic obstructive pulmonary disease, unspecified: Secondary | ICD-10-CM | POA: Diagnosis not present

## 2020-07-08 DIAGNOSIS — I70229 Atherosclerosis of native arteries of extremities with rest pain, unspecified extremity: Secondary | ICD-10-CM | POA: Diagnosis not present

## 2020-07-08 DIAGNOSIS — K2950 Unspecified chronic gastritis without bleeding: Secondary | ICD-10-CM | POA: Diagnosis not present

## 2020-07-08 DIAGNOSIS — L8990 Pressure ulcer of unspecified site, unspecified stage: Secondary | ICD-10-CM | POA: Diagnosis not present

## 2020-07-08 DIAGNOSIS — E43 Unspecified severe protein-calorie malnutrition: Secondary | ICD-10-CM | POA: Diagnosis not present

## 2020-07-08 DIAGNOSIS — Z89611 Acquired absence of right leg above knee: Secondary | ICD-10-CM

## 2020-07-08 DIAGNOSIS — D509 Iron deficiency anemia, unspecified: Secondary | ICD-10-CM | POA: Diagnosis not present

## 2020-07-08 DIAGNOSIS — E1151 Type 2 diabetes mellitus with diabetic peripheral angiopathy without gangrene: Secondary | ICD-10-CM | POA: Diagnosis not present

## 2020-07-08 DIAGNOSIS — Z4781 Encounter for orthopedic aftercare following surgical amputation: Secondary | ICD-10-CM | POA: Diagnosis not present

## 2020-07-08 DIAGNOSIS — I2510 Atherosclerotic heart disease of native coronary artery without angina pectoris: Secondary | ICD-10-CM | POA: Diagnosis not present

## 2020-07-08 DIAGNOSIS — D519 Vitamin B12 deficiency anemia, unspecified: Secondary | ICD-10-CM | POA: Diagnosis not present

## 2020-07-08 DIAGNOSIS — E1165 Type 2 diabetes mellitus with hyperglycemia: Secondary | ICD-10-CM | POA: Diagnosis not present

## 2020-07-08 DIAGNOSIS — I119 Hypertensive heart disease without heart failure: Secondary | ICD-10-CM | POA: Diagnosis not present

## 2020-07-08 NOTE — Progress Notes (Signed)
Post-Op Visit Note   Patient: Omar Todd           Date of Birth: [birth year]           MRN: [id number] Visit Date: 07/08/2020 PCP: [name], DO  Chief Complaint:  Chief Complaint  Patient presents with   Right Leg - Routine Post Op    06/19/20 right AKA     HPI:  HPI The patient is a 69 year old gentleman seen today status post right above-knee amputation on July 30.    Patient is having difficulty transferring. Weakness in upper body and LLE. Difficulty getting from seated to standing position.  Ortho Exam  Incision is clean dry and intact with sutures and staples in place. Not yet fully healed. There is scant clear drainage. Some surrounding maceration and dried blood. there is mild edema.  Visit Diagnoses:  1. 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 He will continue daily Dial soap cleansing dry dressing changes.  Follow-up in the office in 2 weeks.  Follow-Up Instructions: Return in about 2 weeks (around 07/22/2020).   Imaging: No results found.  Orders:  No orders of the defined types were placed in this encounter.  No orders of the defined types were placed in this encounter.    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) 06/24/2020   Right BKA infection ([place]) 06/18/2020   Hyperglycemia due to diabetes mellitus ([place]) 06/18/2020   Thrombocytosis ([place]) 06/18/2020   Pressure injury of skin 05/16/2020   Severe protein-calorie malnutrition (HCC)    Gangrene of right foot ([place]) 03/26/2020   PAD (peripheral artery disease) ([place]) 03/26/2020   Normocytic anemia 03/12/2020   GI bleed 03/12/2020   AAA (abdominal aortic aneurysm) without rupture ([place]) 02/28/2020   Critical lower limb ischemia 02/28/2020   BPH (benign prostatic hyperplasia) 21/19/4174   Systolic ejection murmur 07/04/4817   Bilateral carotid bruits 10/31/2014    Erectile dysfunction 11/05/2013   Bilateral carotid artery disease ([place]) 04/11/2013   Hyperlipidemia associated with type 2 diabetes mellitus ([place]) 04/11/2013   Hypertension associated with diabetes ([place]) 04/11/2013   COPD exacerbation ([place]) 04/11/2013   Diabetes mellitus type 2 controlled 04/11/2013   Diabetic neuropathy, type II diabetes mellitus ([place]) 03/15/2013   Past Medical History:  Diagnosis Date   COPD (chronic obstructive pulmonary disease) (HCC)    Diabetes mellitus without complication (HCC)    Gangrene of toe of right foot ([place])    GERD (gastroesophageal reflux disease)    Hyperlipidemia    Hypertension     Family History  Problem Relation Age of Onset   Cancer Father    Diabetes Sister    Stroke Brother    Healthy Sister    Healthy Sister    Colon cancer Neg Hx    Gastric cancer Neg Hx     Past Surgical History:  Procedure Laterality Date   AMPUTATION Right 05/17/2020   Procedure: AMPUTATION BELOW RIGHT KNEE;  Surgeon: Newt Minion, MD;  Location: [facility];  Service: Orthopedics;  Laterality: Right;   AMPUTATION Right 06/19/2020   Procedure: RIGHT ABOVE KNEE AMPUTATION;  Surgeon: Newt Minion, MD;  Location: [facility];  Service: Orthopedics;  Laterality: Right;   APPLICATION OF WOUND VAC Right 05/17/2020   Procedure: APPLICATION OF WOUND VAC;  Surgeon: Newt Minion, MD;  Location: [facility];  Service: Orthopedics;  Laterality: Right;  BIOPSY  03/13/2020   Procedure: BIOPSY;  Surgeon: Daneil Dolin, MD;  [facility];  Service: Endoscopy;;  gastric biopsy   ESOPHAGOGASTRODUODENOSCOPY N/A 03/13/2020   Procedure: ESOPHAGOGASTRODUODENOSCOPY (EGD);  Surgeon: Daneil Dolin, MD;  [facility];  Service: Endoscopy;  Laterality: N/A;   FLEXIBLE SIGMOIDOSCOPY N/A 05/06/2020   Procedure: FLEXIBLE SIGMOIDOSCOPY;  Surgeon: Daneil Dolin, MD;  [facility];  Service: Endoscopy;  Laterality: N/A;   Thumb surgery      Social History   Occupational History   Occupation: Retired    Comment: Science writer  Tobacco Use   Smoking status: Current Every Day Smoker    Packs/day: 0.50    Years: 60.00    Pack years: 30.00    Types: Cigarettes   Smokeless tobacco: Never Used  Scientific laboratory technician Use: Former  Substance and Sexual Activity   Alcohol use: No    Alcohol/week: 0.0 standard drinks   Drug use: No   Sexual activity: Yes    Birth control/protection: None

## 2020-07-10 DIAGNOSIS — L8990 Pressure ulcer of unspecified site, unspecified stage: Secondary | ICD-10-CM | POA: Diagnosis not present

## 2020-07-10 DIAGNOSIS — E1165 Type 2 diabetes mellitus with hyperglycemia: Secondary | ICD-10-CM | POA: Diagnosis not present

## 2020-07-10 DIAGNOSIS — I119 Hypertensive heart disease without heart failure: Secondary | ICD-10-CM | POA: Diagnosis not present

## 2020-07-10 DIAGNOSIS — J449 Chronic obstructive pulmonary disease, unspecified: Secondary | ICD-10-CM | POA: Diagnosis not present

## 2020-07-10 DIAGNOSIS — I2510 Atherosclerotic heart disease of native coronary artery without angina pectoris: Secondary | ICD-10-CM | POA: Diagnosis not present

## 2020-07-10 DIAGNOSIS — I70229 Atherosclerosis of native arteries of extremities with rest pain, unspecified extremity: Secondary | ICD-10-CM | POA: Diagnosis not present

## 2020-07-10 DIAGNOSIS — D519 Vitamin B12 deficiency anemia, unspecified: Secondary | ICD-10-CM | POA: Diagnosis not present

## 2020-07-10 DIAGNOSIS — E43 Unspecified severe protein-calorie malnutrition: Secondary | ICD-10-CM | POA: Diagnosis not present

## 2020-07-10 DIAGNOSIS — K2950 Unspecified chronic gastritis without bleeding: Secondary | ICD-10-CM | POA: Diagnosis not present

## 2020-07-10 DIAGNOSIS — E1140 Type 2 diabetes mellitus with diabetic neuropathy, unspecified: Secondary | ICD-10-CM | POA: Diagnosis not present

## 2020-07-10 DIAGNOSIS — D509 Iron deficiency anemia, unspecified: Secondary | ICD-10-CM | POA: Diagnosis not present

## 2020-07-10 DIAGNOSIS — Z4781 Encounter for orthopedic aftercare following surgical amputation: Secondary | ICD-10-CM | POA: Diagnosis not present

## 2020-07-10 DIAGNOSIS — E1151 Type 2 diabetes mellitus with diabetic peripheral angiopathy without gangrene: Secondary | ICD-10-CM | POA: Diagnosis not present

## 2020-07-12 ENCOUNTER — Encounter (HOSPITAL_COMMUNITY): Payer: Self-pay | Admitting: Emergency Medicine

## 2020-07-12 ENCOUNTER — Other Ambulatory Visit: Payer: Self-pay

## 2020-07-12 ENCOUNTER — Emergency Department (HOSPITAL_COMMUNITY): Payer: Medicare Other

## 2020-07-12 ENCOUNTER — Inpatient Hospital Stay (HOSPITAL_COMMUNITY)
Admission: EM | Admit: 2020-07-12 | Discharge: 2020-08-21 | DRG: 856 | Disposition: E | Payer: Medicare Other | Attending: Pulmonary Disease | Admitting: Pulmonary Disease

## 2020-07-12 ENCOUNTER — Inpatient Hospital Stay (HOSPITAL_COMMUNITY): Payer: Medicare Other

## 2020-07-12 DIAGNOSIS — I152 Hypertension secondary to endocrine disorders: Secondary | ICD-10-CM | POA: Diagnosis not present

## 2020-07-12 DIAGNOSIS — L89159 Pressure ulcer of sacral region, unspecified stage: Secondary | ICD-10-CM | POA: Diagnosis not present

## 2020-07-12 DIAGNOSIS — Z978 Presence of other specified devices: Secondary | ICD-10-CM

## 2020-07-12 DIAGNOSIS — I38 Endocarditis, valve unspecified: Secondary | ICD-10-CM | POA: Diagnosis not present

## 2020-07-12 DIAGNOSIS — I63233 Cerebral infarction due to unspecified occlusion or stenosis of bilateral carotid arteries: Secondary | ICD-10-CM | POA: Diagnosis not present

## 2020-07-12 DIAGNOSIS — Z880 Allergy status to penicillin: Secondary | ICD-10-CM

## 2020-07-12 DIAGNOSIS — E1151 Type 2 diabetes mellitus with diabetic peripheral angiopathy without gangrene: Secondary | ICD-10-CM | POA: Diagnosis not present

## 2020-07-12 DIAGNOSIS — W19XXXA Unspecified fall, initial encounter: Secondary | ICD-10-CM | POA: Diagnosis present

## 2020-07-12 DIAGNOSIS — J0410 Acute tracheitis without obstruction: Secondary | ICD-10-CM | POA: Diagnosis not present

## 2020-07-12 DIAGNOSIS — T8781 Dehiscence of amputation stump: Secondary | ICD-10-CM

## 2020-07-12 DIAGNOSIS — R579 Shock, unspecified: Secondary | ICD-10-CM | POA: Diagnosis not present

## 2020-07-12 DIAGNOSIS — L89303 Pressure ulcer of unspecified buttock, stage 3: Secondary | ICD-10-CM | POA: Diagnosis present

## 2020-07-12 DIAGNOSIS — I517 Cardiomegaly: Secondary | ICD-10-CM | POA: Diagnosis not present

## 2020-07-12 DIAGNOSIS — J441 Chronic obstructive pulmonary disease with (acute) exacerbation: Secondary | ICD-10-CM | POA: Diagnosis not present

## 2020-07-12 DIAGNOSIS — Z823 Family history of stroke: Secondary | ICD-10-CM

## 2020-07-12 DIAGNOSIS — L89310 Pressure ulcer of right buttock, unstageable: Secondary | ICD-10-CM

## 2020-07-12 DIAGNOSIS — I714 Abdominal aortic aneurysm, without rupture: Secondary | ICD-10-CM | POA: Diagnosis present

## 2020-07-12 DIAGNOSIS — E1159 Type 2 diabetes mellitus with other circulatory complications: Secondary | ICD-10-CM | POA: Diagnosis not present

## 2020-07-12 DIAGNOSIS — I351 Nonrheumatic aortic (valve) insufficiency: Secondary | ICD-10-CM | POA: Diagnosis not present

## 2020-07-12 DIAGNOSIS — T8141XA Infection following a procedure, superficial incisional surgical site, initial encounter: Principal | ICD-10-CM | POA: Diagnosis present

## 2020-07-12 DIAGNOSIS — I119 Hypertensive heart disease without heart failure: Secondary | ICD-10-CM | POA: Diagnosis not present

## 2020-07-12 DIAGNOSIS — T8149XA Infection following a procedure, other surgical site, initial encounter: Secondary | ICD-10-CM | POA: Diagnosis not present

## 2020-07-12 DIAGNOSIS — Z743 Need for continuous supervision: Secondary | ICD-10-CM | POA: Diagnosis not present

## 2020-07-12 DIAGNOSIS — L89101 Pressure ulcer of unspecified part of back, stage 1: Secondary | ICD-10-CM | POA: Diagnosis not present

## 2020-07-12 DIAGNOSIS — F17210 Nicotine dependence, cigarettes, uncomplicated: Secondary | ICD-10-CM | POA: Diagnosis present

## 2020-07-12 DIAGNOSIS — N493 Fournier gangrene: Secondary | ICD-10-CM

## 2020-07-12 DIAGNOSIS — Z9911 Dependence on respirator [ventilator] status: Secondary | ICD-10-CM | POA: Diagnosis not present

## 2020-07-12 DIAGNOSIS — L89899 Pressure ulcer of other site, unspecified stage: Secondary | ICD-10-CM | POA: Diagnosis not present

## 2020-07-12 DIAGNOSIS — E1169 Type 2 diabetes mellitus with other specified complication: Secondary | ICD-10-CM | POA: Diagnosis present

## 2020-07-12 DIAGNOSIS — IMO0002 Reserved for concepts with insufficient information to code with codable children: Secondary | ICD-10-CM | POA: Diagnosis present

## 2020-07-12 DIAGNOSIS — L8990 Pressure ulcer of unspecified site, unspecified stage: Secondary | ICD-10-CM | POA: Diagnosis not present

## 2020-07-12 DIAGNOSIS — I700 Atherosclerosis of aorta: Secondary | ICD-10-CM | POA: Diagnosis not present

## 2020-07-12 DIAGNOSIS — J323 Chronic sphenoidal sinusitis: Secondary | ICD-10-CM | POA: Diagnosis not present

## 2020-07-12 DIAGNOSIS — I6782 Cerebral ischemia: Secondary | ICD-10-CM | POA: Diagnosis not present

## 2020-07-12 DIAGNOSIS — D509 Iron deficiency anemia, unspecified: Secondary | ICD-10-CM | POA: Diagnosis not present

## 2020-07-12 DIAGNOSIS — G92 Toxic encephalopathy: Secondary | ICD-10-CM | POA: Diagnosis not present

## 2020-07-12 DIAGNOSIS — E1165 Type 2 diabetes mellitus with hyperglycemia: Secondary | ICD-10-CM | POA: Diagnosis not present

## 2020-07-12 DIAGNOSIS — Z20822 Contact with and (suspected) exposure to covid-19: Secondary | ICD-10-CM | POA: Diagnosis not present

## 2020-07-12 DIAGNOSIS — K7200 Acute and subacute hepatic failure without coma: Secondary | ICD-10-CM | POA: Diagnosis not present

## 2020-07-12 DIAGNOSIS — Z4659 Encounter for fitting and adjustment of other gastrointestinal appliance and device: Secondary | ICD-10-CM

## 2020-07-12 DIAGNOSIS — I350 Nonrheumatic aortic (valve) stenosis: Secondary | ICD-10-CM | POA: Diagnosis present

## 2020-07-12 DIAGNOSIS — E876 Hypokalemia: Secondary | ICD-10-CM | POA: Diagnosis present

## 2020-07-12 DIAGNOSIS — R569 Unspecified convulsions: Secondary | ICD-10-CM | POA: Diagnosis not present

## 2020-07-12 DIAGNOSIS — I7090 Unspecified atherosclerosis: Secondary | ICD-10-CM | POA: Diagnosis not present

## 2020-07-12 DIAGNOSIS — N179 Acute kidney failure, unspecified: Secondary | ICD-10-CM | POA: Diagnosis not present

## 2020-07-12 DIAGNOSIS — R509 Fever, unspecified: Secondary | ICD-10-CM | POA: Diagnosis not present

## 2020-07-12 DIAGNOSIS — G9341 Metabolic encephalopathy: Secondary | ICD-10-CM | POA: Diagnosis not present

## 2020-07-12 DIAGNOSIS — K7201 Acute and subacute hepatic failure with coma: Secondary | ICD-10-CM | POA: Diagnosis not present

## 2020-07-12 DIAGNOSIS — A419 Sepsis, unspecified organism: Secondary | ICD-10-CM | POA: Diagnosis not present

## 2020-07-12 DIAGNOSIS — R000 Tachycardia, unspecified: Secondary | ICD-10-CM | POA: Diagnosis not present

## 2020-07-12 DIAGNOSIS — K219 Gastro-esophageal reflux disease without esophagitis: Secondary | ICD-10-CM | POA: Diagnosis present

## 2020-07-12 DIAGNOSIS — E870 Hyperosmolality and hypernatremia: Secondary | ICD-10-CM | POA: Diagnosis not present

## 2020-07-12 DIAGNOSIS — M726 Necrotizing fasciitis: Secondary | ICD-10-CM

## 2020-07-12 DIAGNOSIS — R7989 Other specified abnormal findings of blood chemistry: Secondary | ICD-10-CM

## 2020-07-12 DIAGNOSIS — Z79899 Other long term (current) drug therapy: Secondary | ICD-10-CM

## 2020-07-12 DIAGNOSIS — Z89611 Acquired absence of right leg above knee: Secondary | ICD-10-CM | POA: Diagnosis present

## 2020-07-12 DIAGNOSIS — R52 Pain, unspecified: Secondary | ICD-10-CM | POA: Diagnosis not present

## 2020-07-12 DIAGNOSIS — I70229 Atherosclerosis of native arteries of extremities with rest pain, unspecified extremity: Secondary | ICD-10-CM | POA: Diagnosis not present

## 2020-07-12 DIAGNOSIS — D519 Vitamin B12 deficiency anemia, unspecified: Secondary | ICD-10-CM | POA: Diagnosis not present

## 2020-07-12 DIAGNOSIS — Z4682 Encounter for fitting and adjustment of non-vascular catheter: Secondary | ICD-10-CM | POA: Diagnosis not present

## 2020-07-12 DIAGNOSIS — E1140 Type 2 diabetes mellitus with diabetic neuropathy, unspecified: Secondary | ICD-10-CM | POA: Diagnosis not present

## 2020-07-12 DIAGNOSIS — E119 Type 2 diabetes mellitus without complications: Secondary | ICD-10-CM | POA: Diagnosis not present

## 2020-07-12 DIAGNOSIS — J9600 Acute respiratory failure, unspecified whether with hypoxia or hypercapnia: Secondary | ICD-10-CM | POA: Diagnosis not present

## 2020-07-12 DIAGNOSIS — T8112XA Postprocedural septic shock, initial encounter: Secondary | ICD-10-CM | POA: Diagnosis not present

## 2020-07-12 DIAGNOSIS — J9690 Respiratory failure, unspecified, unspecified whether with hypoxia or hypercapnia: Secondary | ICD-10-CM | POA: Diagnosis not present

## 2020-07-12 DIAGNOSIS — Z7984 Long term (current) use of oral hypoglycemic drugs: Secondary | ICD-10-CM

## 2020-07-12 DIAGNOSIS — I5021 Acute systolic (congestive) heart failure: Secondary | ICD-10-CM | POA: Diagnosis present

## 2020-07-12 DIAGNOSIS — Z4781 Encounter for orthopedic aftercare following surgical amputation: Secondary | ICD-10-CM | POA: Diagnosis not present

## 2020-07-12 DIAGNOSIS — I10 Essential (primary) hypertension: Secondary | ICD-10-CM | POA: Diagnosis not present

## 2020-07-12 DIAGNOSIS — I639 Cerebral infarction, unspecified: Secondary | ICD-10-CM | POA: Diagnosis not present

## 2020-07-12 DIAGNOSIS — K2950 Unspecified chronic gastritis without bleeding: Secondary | ICD-10-CM | POA: Diagnosis not present

## 2020-07-12 DIAGNOSIS — Z7982 Long term (current) use of aspirin: Secondary | ICD-10-CM

## 2020-07-12 DIAGNOSIS — M793 Panniculitis, unspecified: Secondary | ICD-10-CM | POA: Diagnosis not present

## 2020-07-12 DIAGNOSIS — I340 Nonrheumatic mitral (valve) insufficiency: Secondary | ICD-10-CM | POA: Diagnosis not present

## 2020-07-12 DIAGNOSIS — L89313 Pressure ulcer of right buttock, stage 3: Secondary | ICD-10-CM

## 2020-07-12 DIAGNOSIS — J449 Chronic obstructive pulmonary disease, unspecified: Secondary | ICD-10-CM | POA: Diagnosis present

## 2020-07-12 DIAGNOSIS — D638 Anemia in other chronic diseases classified elsewhere: Secondary | ICD-10-CM | POA: Diagnosis present

## 2020-07-12 DIAGNOSIS — R6510 Systemic inflammatory response syndrome (SIRS) of non-infectious origin without acute organ dysfunction: Secondary | ICD-10-CM

## 2020-07-12 DIAGNOSIS — K7689 Other specified diseases of liver: Secondary | ICD-10-CM | POA: Diagnosis not present

## 2020-07-12 DIAGNOSIS — R6521 Severe sepsis with septic shock: Secondary | ICD-10-CM | POA: Diagnosis not present

## 2020-07-12 DIAGNOSIS — I499 Cardiac arrhythmia, unspecified: Secondary | ICD-10-CM | POA: Diagnosis not present

## 2020-07-12 DIAGNOSIS — Z809 Family history of malignant neoplasm, unspecified: Secondary | ICD-10-CM

## 2020-07-12 DIAGNOSIS — J320 Chronic maxillary sinusitis: Secondary | ICD-10-CM | POA: Diagnosis not present

## 2020-07-12 DIAGNOSIS — L03818 Cellulitis of other sites: Secondary | ICD-10-CM | POA: Diagnosis not present

## 2020-07-12 DIAGNOSIS — T8743 Infection of amputation stump, right lower extremity: Secondary | ICD-10-CM | POA: Diagnosis not present

## 2020-07-12 DIAGNOSIS — E8770 Fluid overload, unspecified: Secondary | ICD-10-CM | POA: Diagnosis not present

## 2020-07-12 DIAGNOSIS — N400 Enlarged prostate without lower urinary tract symptoms: Secondary | ICD-10-CM | POA: Diagnosis not present

## 2020-07-12 DIAGNOSIS — K760 Fatty (change of) liver, not elsewhere classified: Secondary | ICD-10-CM | POA: Diagnosis present

## 2020-07-12 DIAGNOSIS — E785 Hyperlipidemia, unspecified: Secondary | ICD-10-CM | POA: Diagnosis present

## 2020-07-12 DIAGNOSIS — D6489 Other specified anemias: Secondary | ICD-10-CM | POA: Diagnosis present

## 2020-07-12 DIAGNOSIS — L89152 Pressure ulcer of sacral region, stage 2: Secondary | ICD-10-CM | POA: Diagnosis present

## 2020-07-12 DIAGNOSIS — J9601 Acute respiratory failure with hypoxia: Secondary | ICD-10-CM

## 2020-07-12 DIAGNOSIS — J90 Pleural effusion, not elsewhere classified: Secondary | ICD-10-CM | POA: Diagnosis not present

## 2020-07-12 DIAGNOSIS — R0902 Hypoxemia: Secondary | ICD-10-CM | POA: Diagnosis not present

## 2020-07-12 DIAGNOSIS — G9389 Other specified disorders of brain: Secondary | ICD-10-CM | POA: Diagnosis not present

## 2020-07-12 DIAGNOSIS — I6389 Other cerebral infarction: Secondary | ICD-10-CM | POA: Diagnosis not present

## 2020-07-12 DIAGNOSIS — Z66 Do not resuscitate: Secondary | ICD-10-CM | POA: Diagnosis not present

## 2020-07-12 DIAGNOSIS — E43 Unspecified severe protein-calorie malnutrition: Secondary | ICD-10-CM | POA: Diagnosis not present

## 2020-07-12 DIAGNOSIS — G319 Degenerative disease of nervous system, unspecified: Secondary | ICD-10-CM | POA: Diagnosis not present

## 2020-07-12 DIAGNOSIS — Z89511 Acquired absence of right leg below knee: Secondary | ICD-10-CM

## 2020-07-12 DIAGNOSIS — Z833 Family history of diabetes mellitus: Secondary | ICD-10-CM

## 2020-07-12 DIAGNOSIS — R9082 White matter disease, unspecified: Secondary | ICD-10-CM | POA: Diagnosis not present

## 2020-07-12 DIAGNOSIS — L98429 Non-pressure chronic ulcer of back with unspecified severity: Secondary | ICD-10-CM

## 2020-07-12 DIAGNOSIS — G9340 Encephalopathy, unspecified: Secondary | ICD-10-CM | POA: Diagnosis not present

## 2020-07-12 DIAGNOSIS — L8995 Pressure ulcer of unspecified site, unstageable: Secondary | ICD-10-CM | POA: Diagnosis not present

## 2020-07-12 DIAGNOSIS — L89126 Pressure-induced deep tissue damage of left upper back: Secondary | ICD-10-CM | POA: Clinically undetermined

## 2020-07-12 DIAGNOSIS — I2510 Atherosclerotic heart disease of native coronary artery without angina pectoris: Secondary | ICD-10-CM | POA: Diagnosis not present

## 2020-07-12 LAB — URINALYSIS, ROUTINE W REFLEX MICROSCOPIC
Bacteria, UA: NONE SEEN
Bilirubin Urine: NEGATIVE
Glucose, UA: >=500 mg/dL — AB
Ketones, ur: 20 mg/dL — AB
Leukocytes,Ua: NEGATIVE
Nitrite: NEGATIVE
Protein, ur: 30 mg/dL — AB
Specific Gravity, Urine: 1.027 (ref 1.005–1.030)
pH: 5 (ref 5.0–8.0)

## 2020-07-12 LAB — CBC WITH DIFFERENTIAL/PLATELET
Abs Immature Granulocytes: 1.31 10*3/uL — ABNORMAL HIGH (ref 0.00–0.07)
Basophils Absolute: 0.1 10*3/uL (ref 0.0–0.1)
Basophils Relative: 0 %
Eosinophils Absolute: 0 10*3/uL (ref 0.0–0.5)
Eosinophils Relative: 0 %
HCT: 25.1 % — ABNORMAL LOW (ref 39.0–52.0)
Hemoglobin: 7.3 g/dL — ABNORMAL LOW (ref 13.0–17.0)
Immature Granulocytes: 4 %
Lymphocytes Relative: 1 %
Lymphs Abs: 0.4 10*3/uL — ABNORMAL LOW (ref 0.7–4.0)
MCH: 23 pg — ABNORMAL LOW (ref 26.0–34.0)
MCHC: 29.1 g/dL — ABNORMAL LOW (ref 30.0–36.0)
MCV: 78.9 fL — ABNORMAL LOW (ref 80.0–100.0)
Monocytes Absolute: 0.8 10*3/uL (ref 0.1–1.0)
Monocytes Relative: 2 %
Neutro Abs: 28.9 10*3/uL — ABNORMAL HIGH (ref 1.7–7.7)
Neutrophils Relative %: 93 %
Platelets: 425 10*3/uL — ABNORMAL HIGH (ref 150–400)
RBC: 3.18 MIL/uL — ABNORMAL LOW (ref 4.22–5.81)
RDW: 17.3 % — ABNORMAL HIGH (ref 11.5–15.5)
WBC: 31.5 10*3/uL — ABNORMAL HIGH (ref 4.0–10.5)
nRBC: 0 % (ref 0.0–0.2)

## 2020-07-12 LAB — MAGNESIUM: Magnesium: 1.7 mg/dL (ref 1.7–2.4)

## 2020-07-12 LAB — APTT: aPTT: 34 seconds (ref 24–36)

## 2020-07-12 LAB — LACTIC ACID, PLASMA
Lactic Acid, Venous: 1.4 mmol/L (ref 0.5–1.9)
Lactic Acid, Venous: 2.1 mmol/L (ref 0.5–1.9)
Lactic Acid, Venous: 1.7 mmol/L (ref 0.5–1.9)

## 2020-07-12 LAB — COMPREHENSIVE METABOLIC PANEL
ALT: 24 U/L (ref 0–44)
AST: 28 U/L (ref 15–41)
Albumin: 2.1 g/dL — ABNORMAL LOW (ref 3.5–5.0)
Alkaline Phosphatase: 117 U/L (ref 38–126)
Anion gap: 14 (ref 5–15)
BUN: 17 mg/dL (ref 8–23)
CO2: 26 mmol/L (ref 22–32)
Calcium: 8.2 mg/dL — ABNORMAL LOW (ref 8.9–10.3)
Chloride: 97 mmol/L — ABNORMAL LOW (ref 98–111)
Creatinine, Ser: 0.62 mg/dL (ref 0.61–1.24)
GFR calc Af Amer: >60 mL/min (ref >60)
GFR calc non Af Amer: >60 mL/min (ref >60)
Glucose, Bld: 213 mg/dL — ABNORMAL HIGH (ref 70–99)
Potassium: 2.6 mmol/L — CL (ref 3.5–5.1)
Sodium: 137 mmol/L (ref 135–145)
Total Bilirubin: 0.9 mg/dL (ref 0.3–1.2)
Total Protein: 5.7 g/dL — ABNORMAL LOW (ref 6.5–8.1)

## 2020-07-12 LAB — PROTIME-INR
INR: 1.3 — ABNORMAL HIGH (ref 0.8–1.2)
Prothrombin Time: 15.2 seconds (ref 11.4–15.2)

## 2020-07-12 LAB — CBG MONITORING, ED: Glucose-Capillary: 232 mg/dL — ABNORMAL HIGH (ref 70–99)

## 2020-07-12 LAB — SARS CORONAVIRUS 2 BY RT PCR (HOSPITAL ORDER, PERFORMED IN ~~LOC~~ HOSPITAL LAB): SARS Coronavirus 2: NEGATIVE

## 2020-07-12 IMAGING — DX DG SACRUM/COCCYX 2+V
3 series · 3 of 3 positions shown · non-contrast
Comparison: None.

CLINICAL DATA: Decubitus ulcer.  Evaluate for osteomyelitis.

EXAM:
SACRUM AND COCCYX - 2+ VIEW

[coccyx ap]
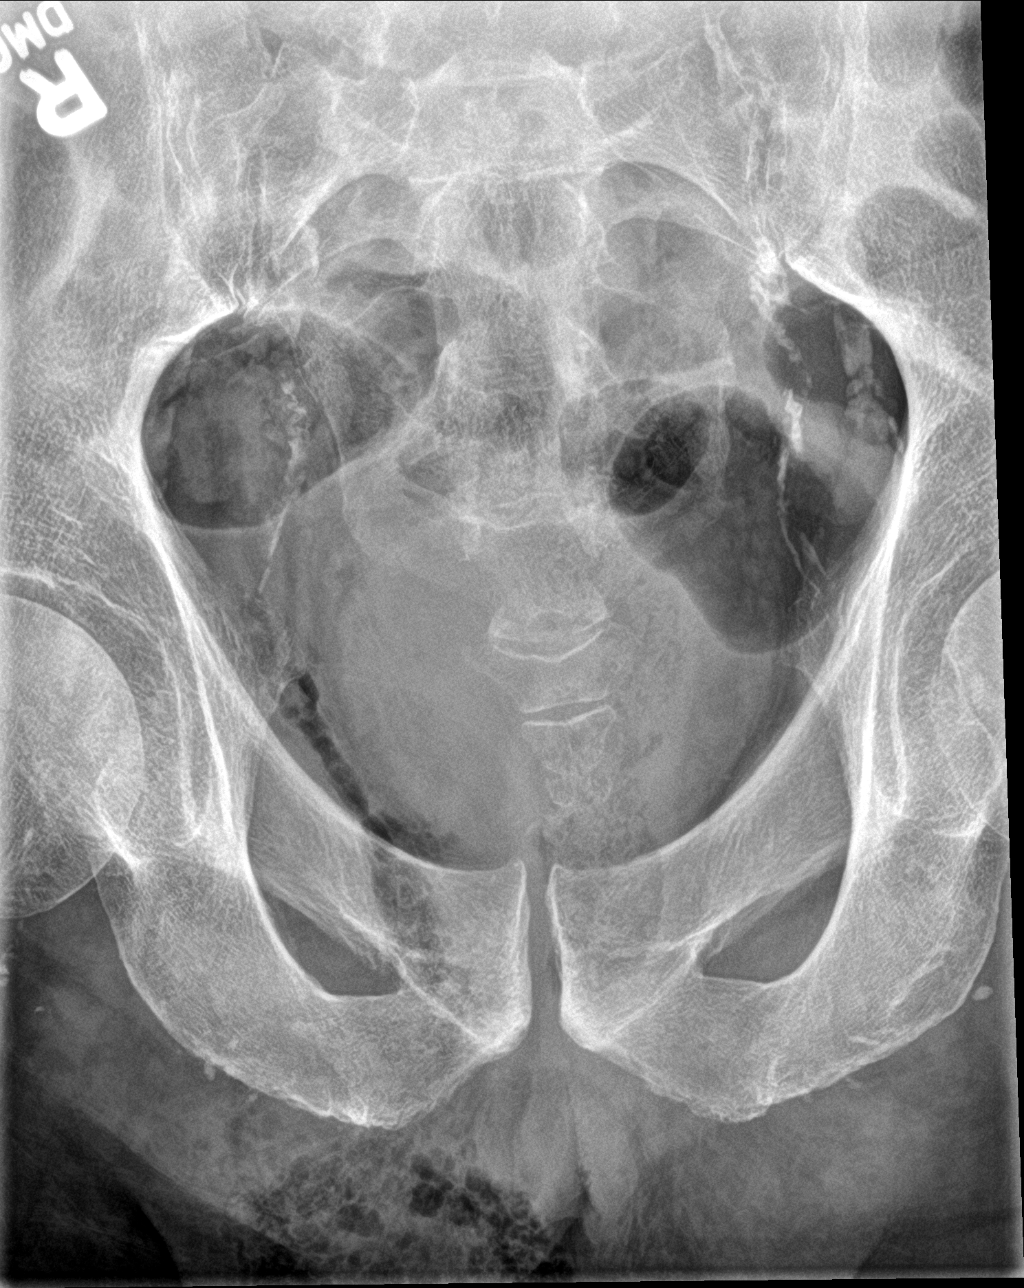

[sacrum ap]
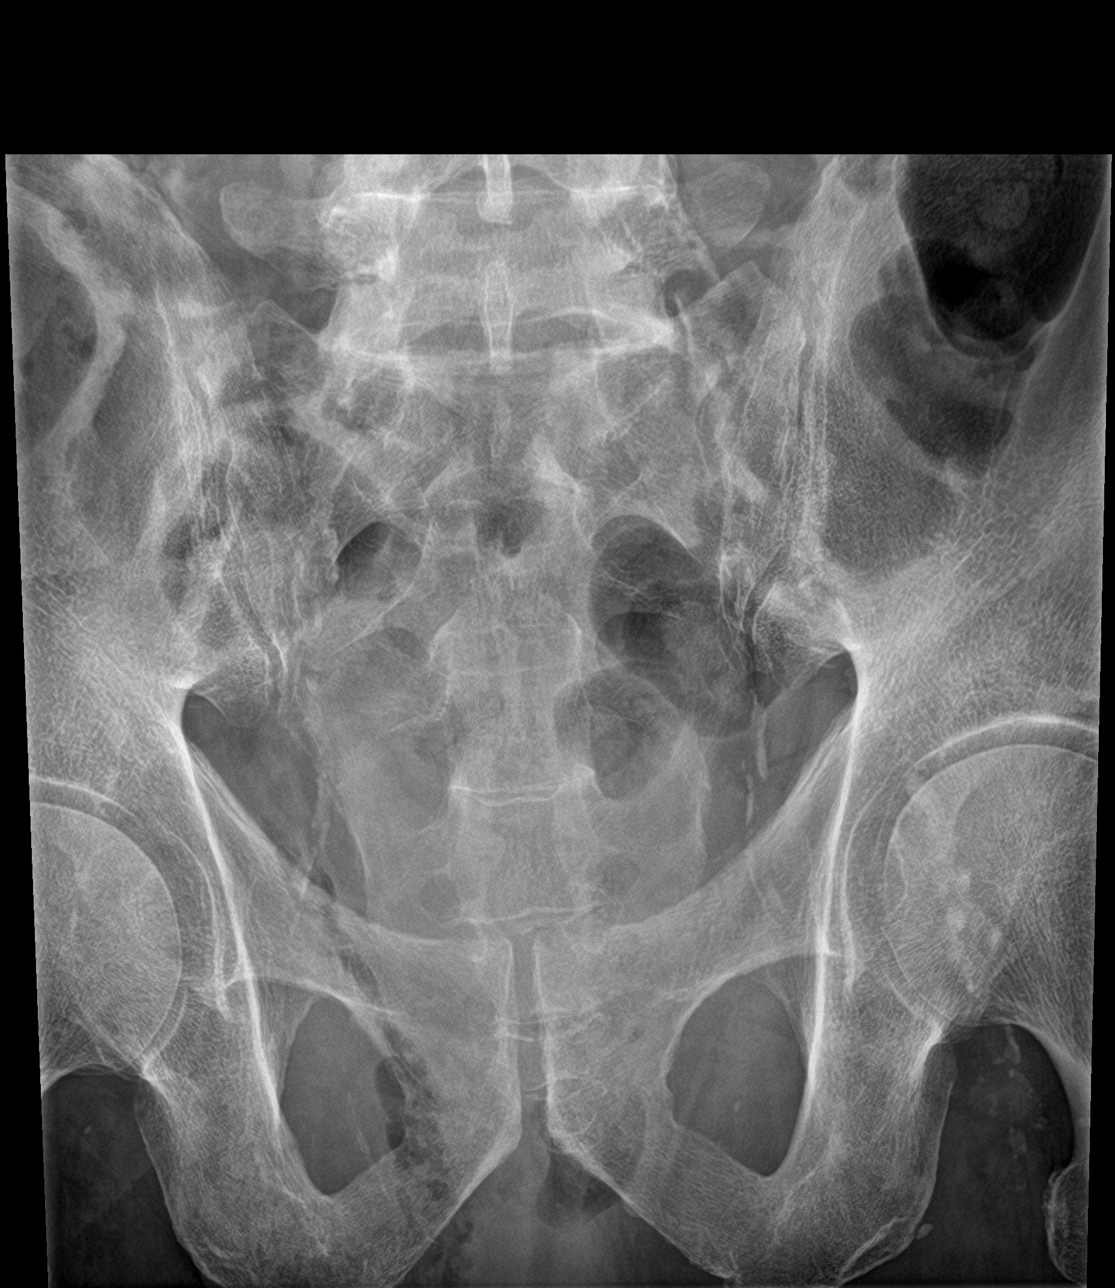

[sacrum lat]
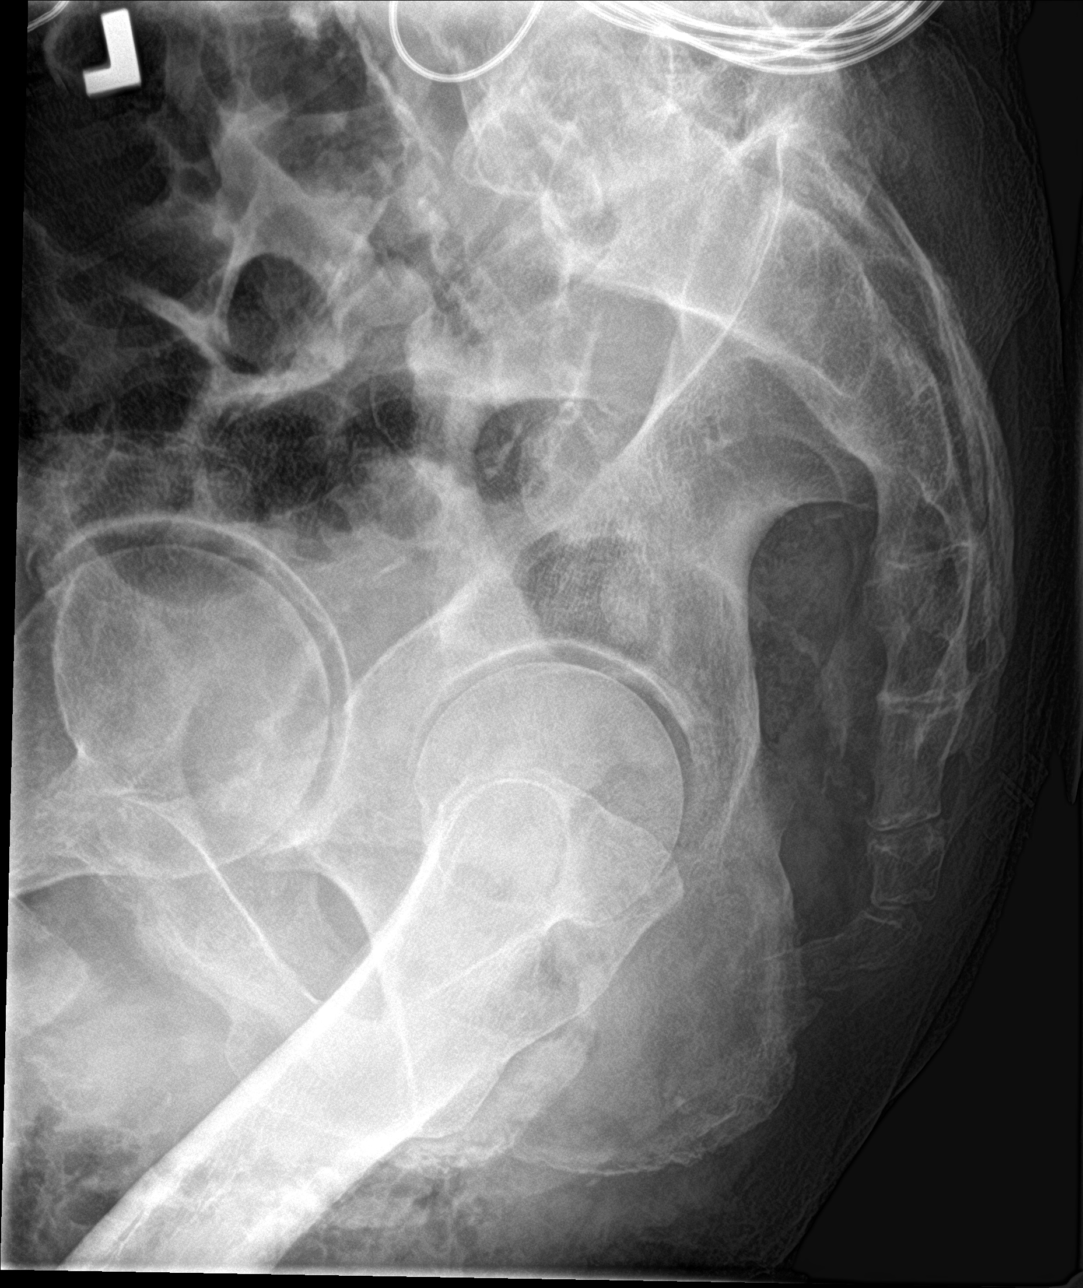

[3 of 3 positions shown; findings below may reference images not displayed]

FINDINGS: No acute bony abnormality identified. No radiographic evidence of
osteomyelitis identified. Gas in the soft tissues is noted overlying
the LOWER RIGHT pelvis on the frontal view.
IMPRESSION: 1. No acute bony abnormality. No definite evidence of acute
osteomyelitis.
2. Gas in the soft tissues overlying the LOWER RIGHT pelvis which
may represent this patient's decubitus ulcer but correlate
clinically.

## 2020-07-12 IMAGING — DX DG CHEST 1V PORT
1 series · 2 of 2 positions shown · non-contrast
Comparison: The [DATE]

CLINICAL DATA: Fever and sacral ulcer.

EXAM:
PORTABLE CHEST 1 VIEW

[Series 1: chest ap · 0.14mm/px · 2 of 2 slices shown]
[im 1/2]
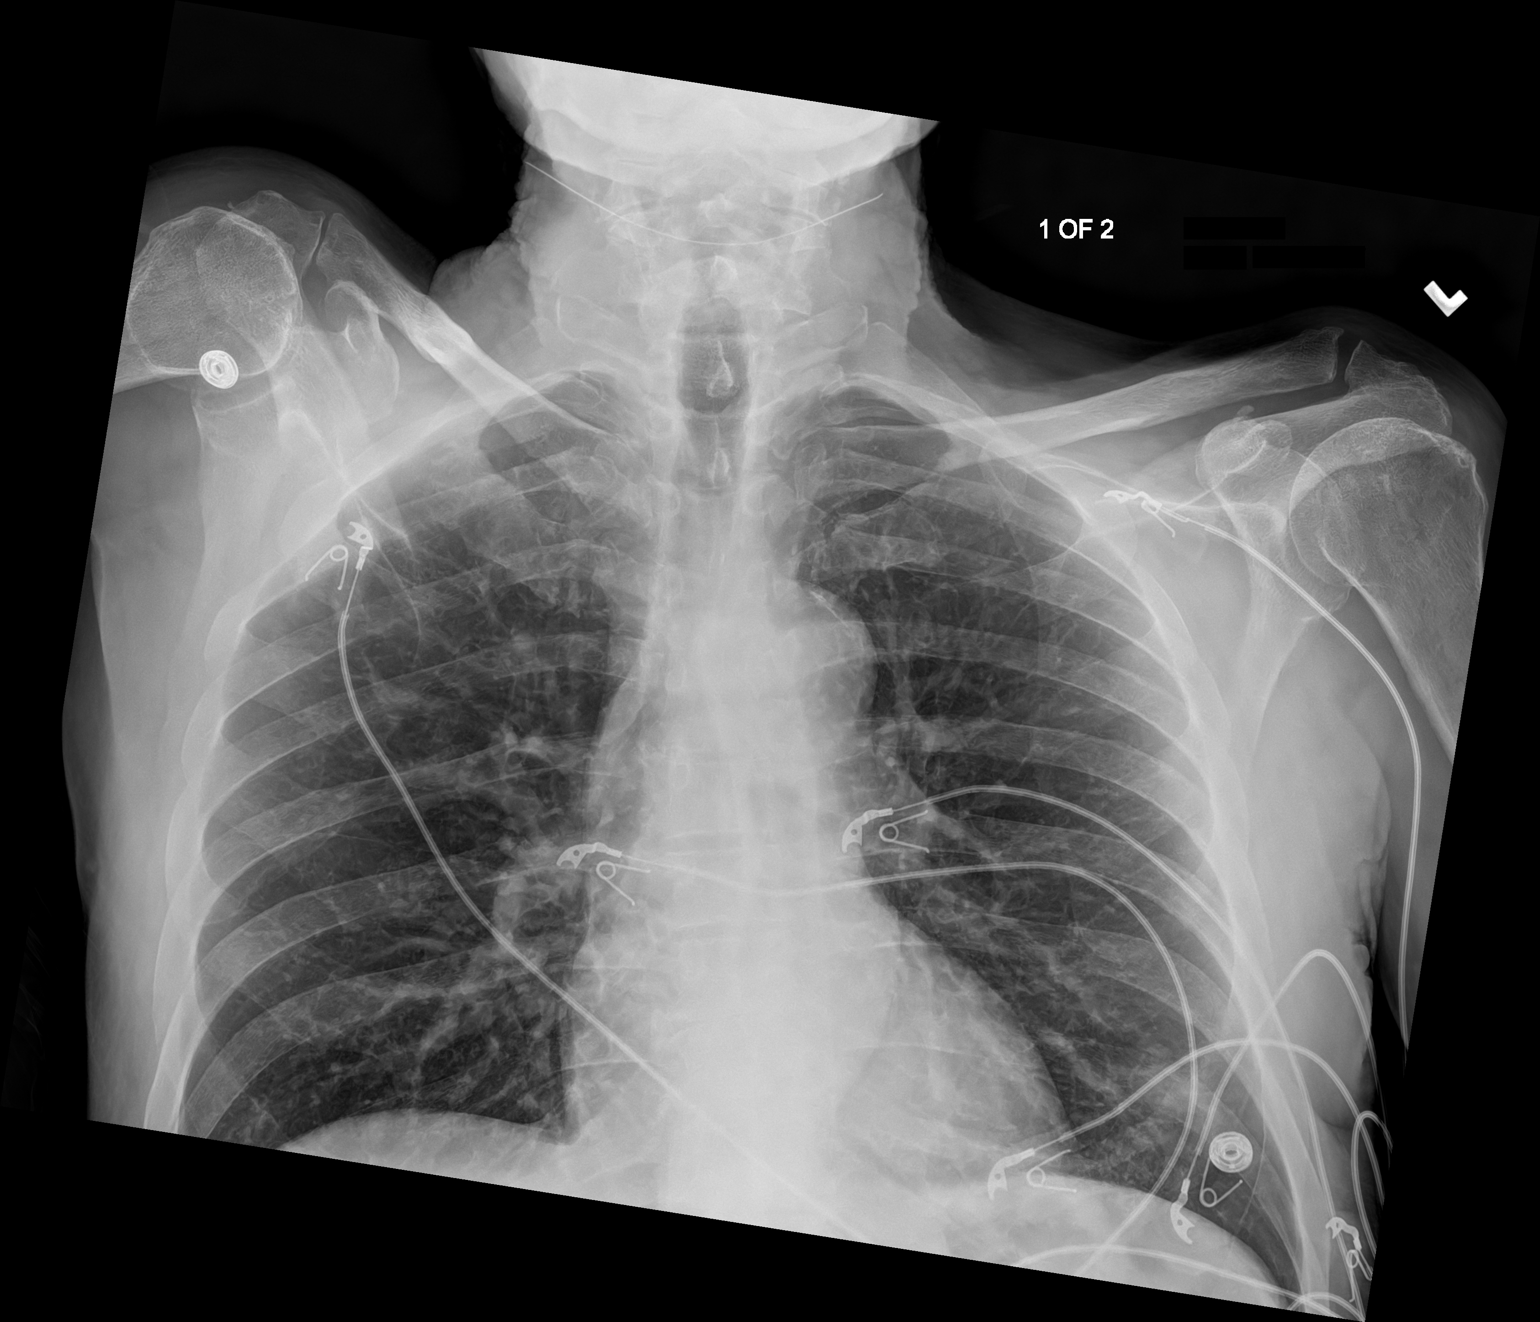
[im 2/2]
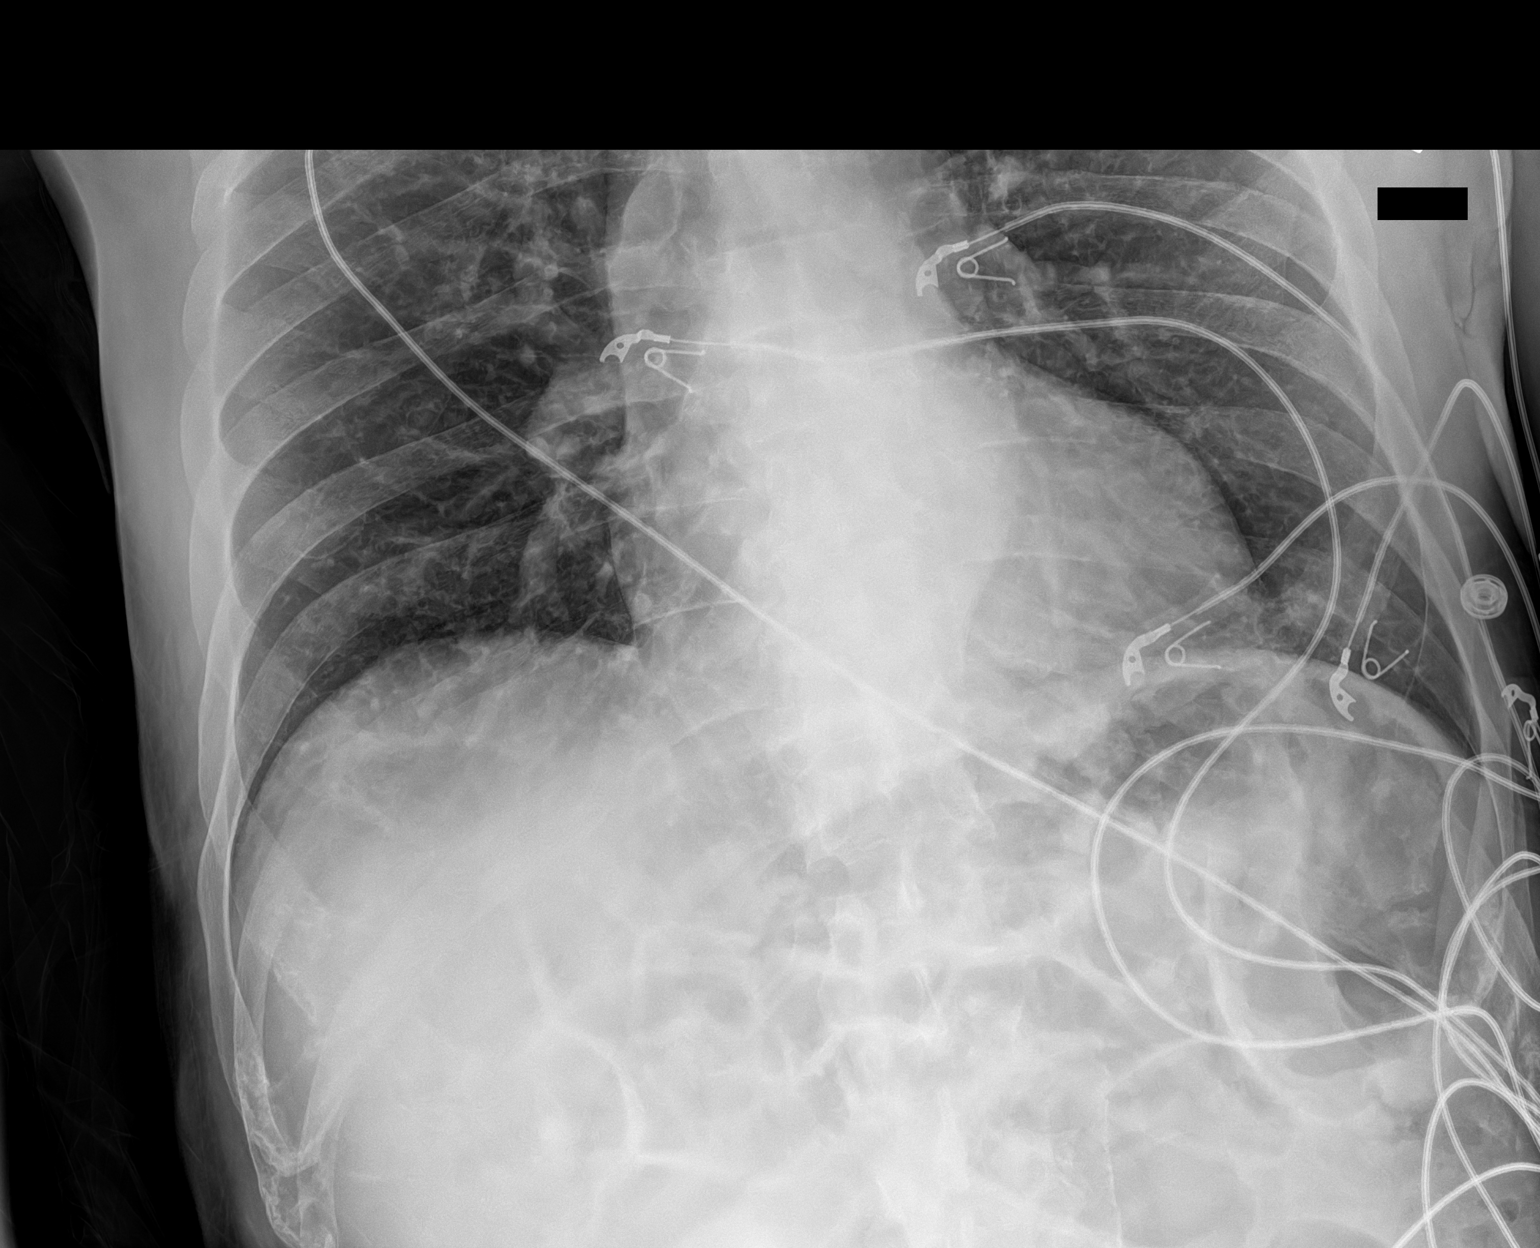

[2 of 2 positions shown; findings below may reference images not displayed]

FINDINGS: There is no evidence of acute infiltrate, pleural effusion or
pneumothorax. The heart size and mediastinal contours are within
normal limits. There is mild calcification of the aortic arch and
tortuosity of the descending thoracic aorta. The visualized skeletal
structures are unremarkable.
IMPRESSION: No active disease.

## 2020-07-12 IMAGING — CT CT PELVIS W/ CM
2 of 7 series · 14 of 46 positions shown, 19 images · IV contrast (omnipaque)
Comparison: None.

CLINICAL DATA: Sacral ulcer

EXAM:
CT PELVIS WITH CONTRAST
TECHNIQUE: Multidetector CT imaging of the pelvis was performed using the
standard protocol following the bolus administration of intravenous
contrast.
CONTRAST:  100mL OMNIPAQUE IOHEXOL 300 MG/ML  SOLN

[Series 3: axial st · axial · 0.83mm/px · z∈[+510,+810]mm · 11 of 72 slices shown, 16 images]
[im 6/72  soft-tissue]
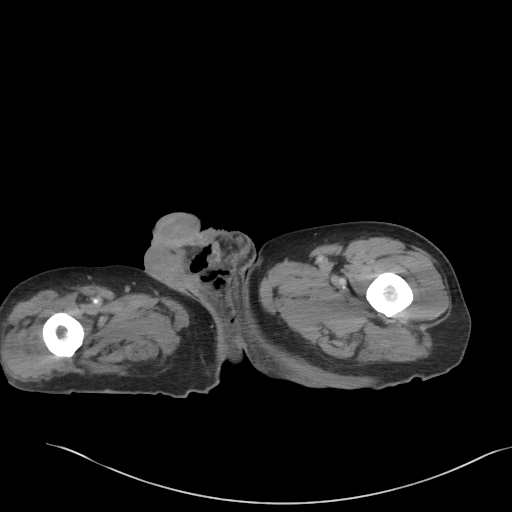
[im 6/72  bone]
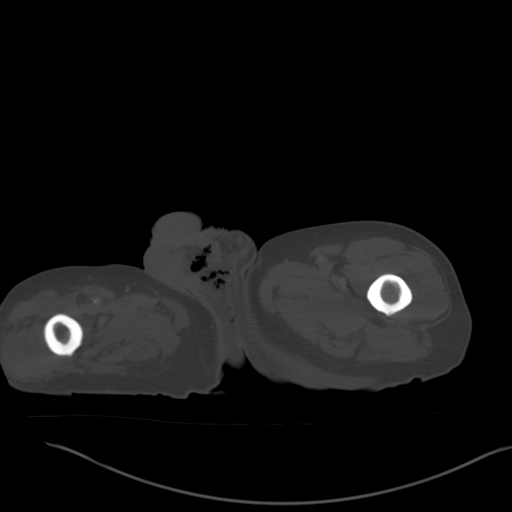
[im 11/72  soft-tissue]
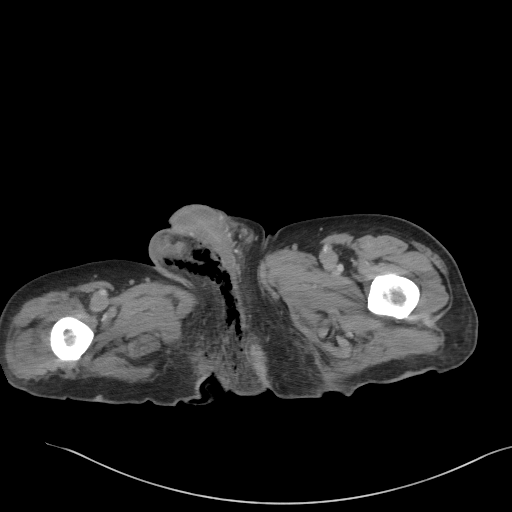
[im 21/72  soft-tissue]
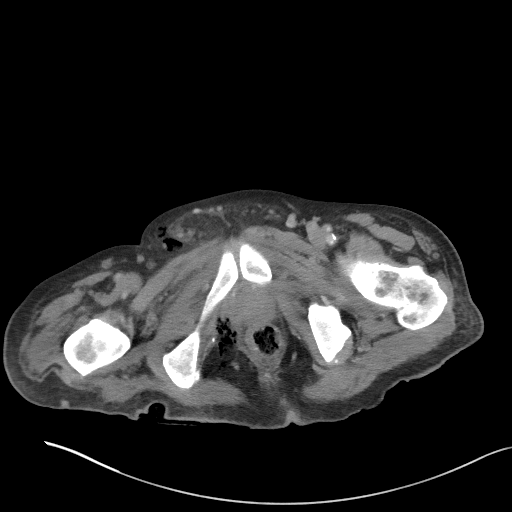
[im 26/72  soft-tissue]
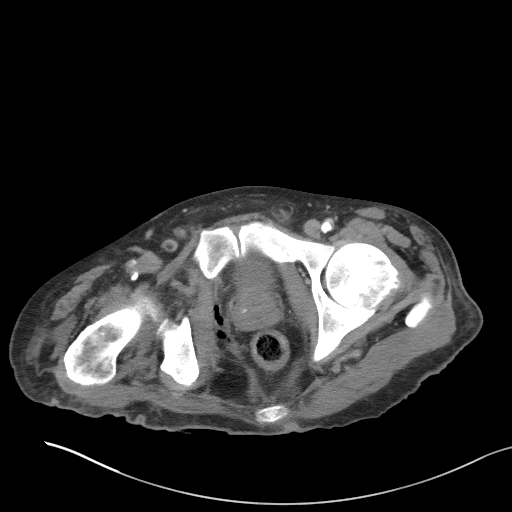
[im 31/72  soft-tissue]
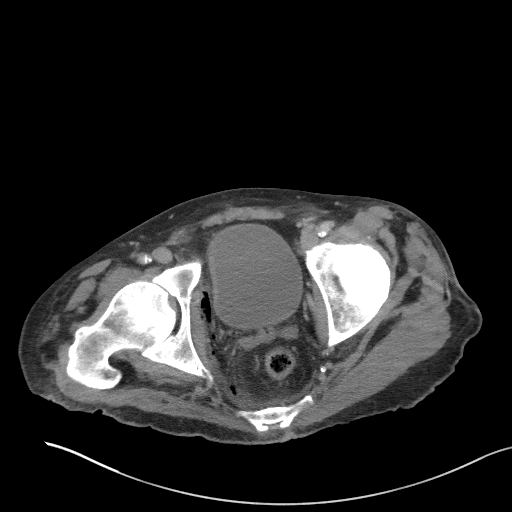
[im 41/72  soft-tissue]
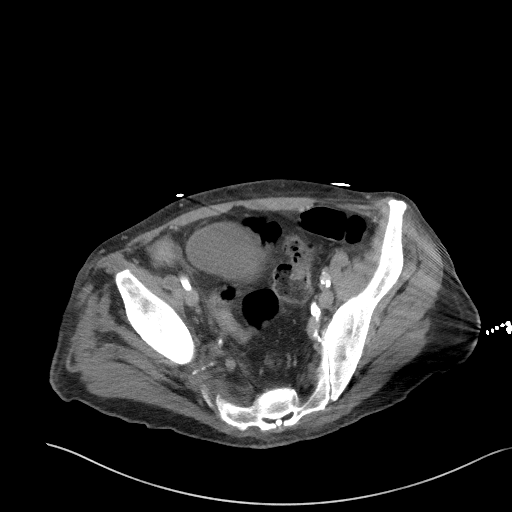
[im 46/72  soft-tissue]
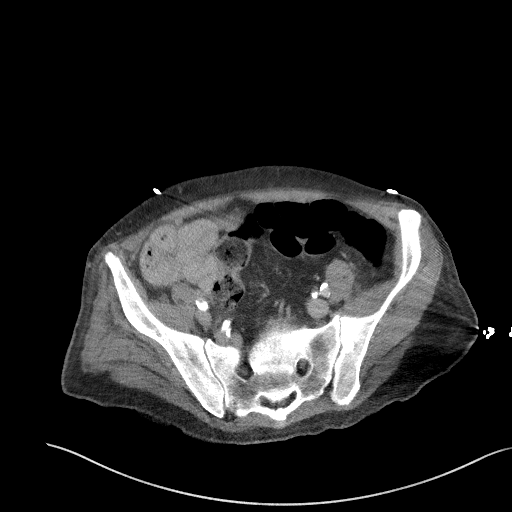
[im 51/72  soft-tissue]
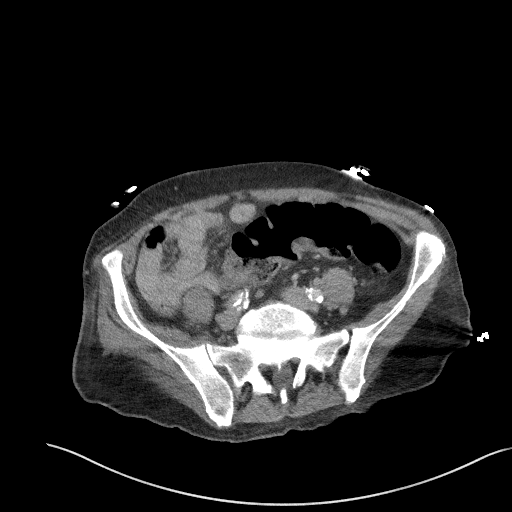
[im 51/72  lung]
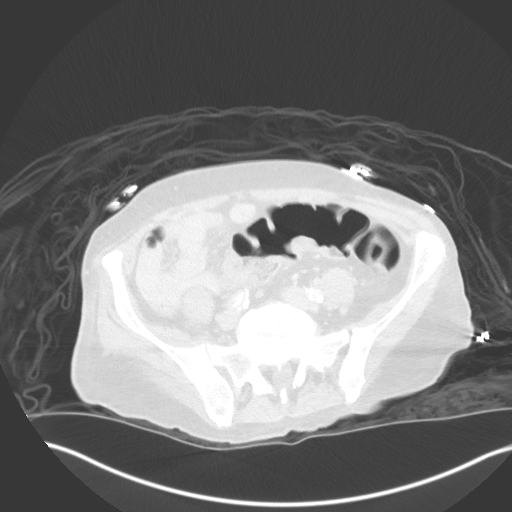
[im 56/72  lung]
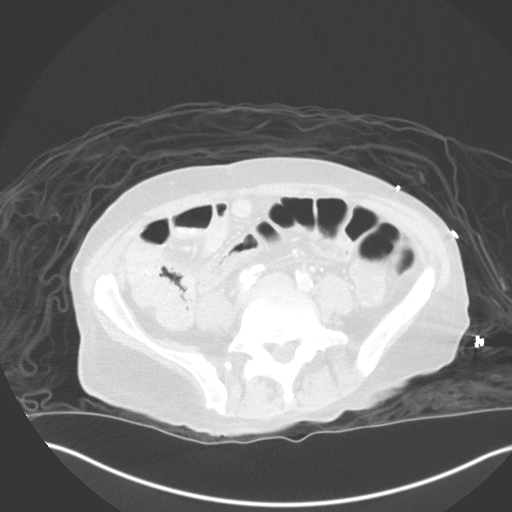
[im 61/72  soft-tissue]
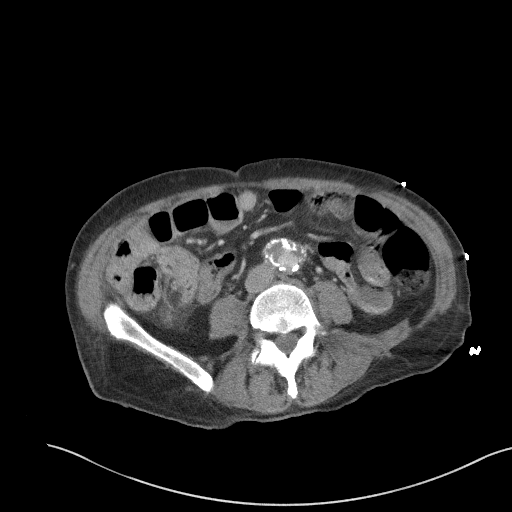
[im 61/72  lung]
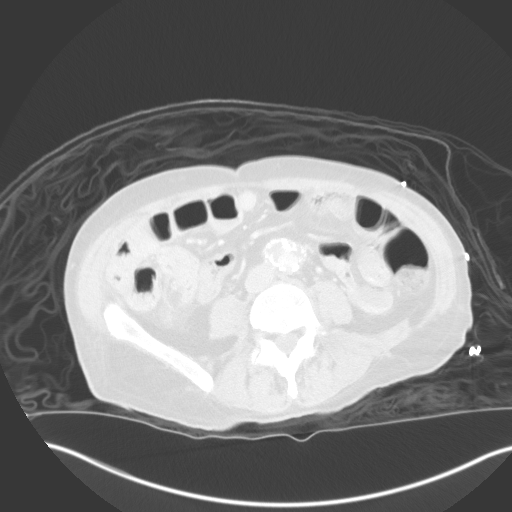
[im 61/72  bone]
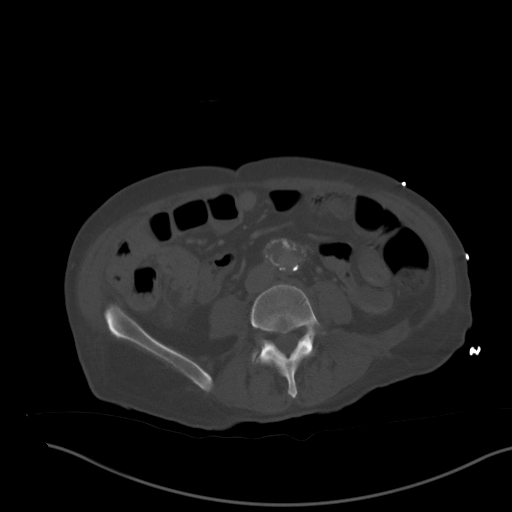
[im 66/72  soft-tissue]
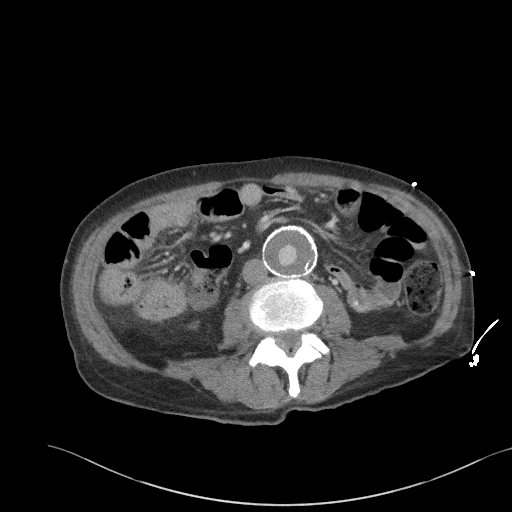
[im 66/72  lung]
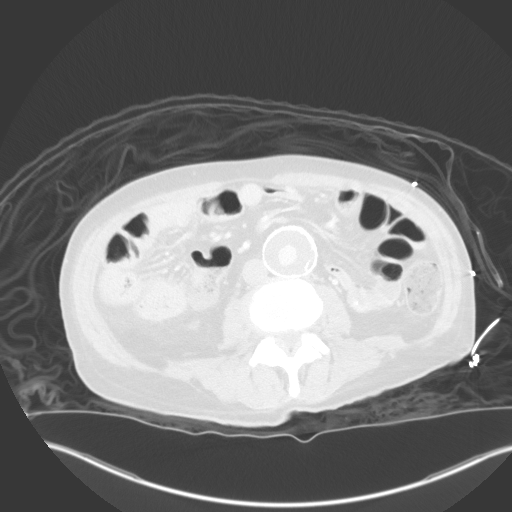

[Series 5: coronal st · coronal · 0.78mm/px · 3 of 92 slices shown]
[im 23/92  soft-tissue]
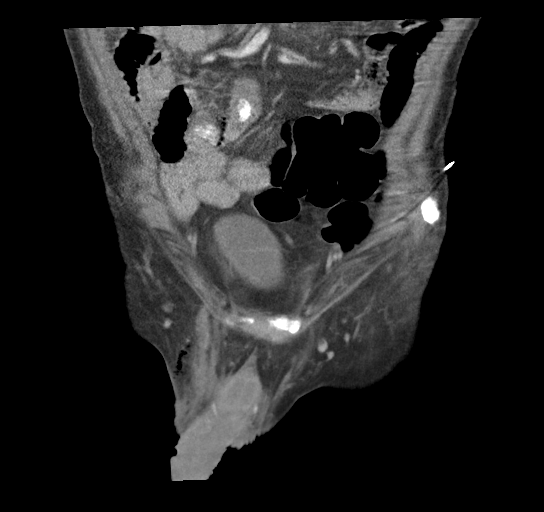
[im 46/92  soft-tissue]
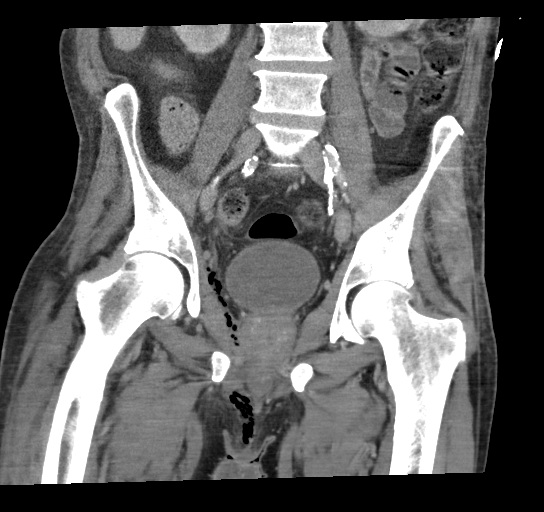
[im 69/92  soft-tissue]
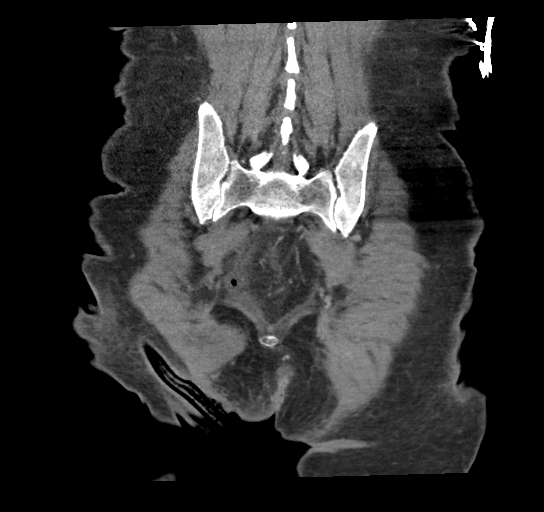

[14 of 46 positions shown; findings below may reference images not displayed]

FINDINGS: Urinary Tract:  No abnormality visualized.

Bowel:  Unremarkable visualized pelvic bowel loops.

Vascular/Lymphatic: Partially thrombosed infrarenal abdominal aortic
aneurysm measuring 4.6 cm in diameter. Large amount of calcific
atherosclerosis.

Reproductive:  No mass or other significant abnormality

Other: There is a right sacro-ischial decubitus ulcer with gas
dissecting along the right perineal soft tissues into the right
retroperitoneum and right inguinal canal. No fluid collection.

Musculoskeletal: No suspicious bone lesions identified.
IMPRESSION: 1. Right sacro-ischial decubitus ulcer with gas dissecting along the
right perineal soft tissues into the right retroperitoneum and right
inguinal canal. No fluid collection.
2. Partially thrombosed infrarenal abdominal aortic aneurysm
measuring 4.6 cm in diameter. Recommend follow-up every 6 months and
vascular consultation. This recommendation follows ACR consensus
guidelines: White Paper of the ACR Incidental Findings Committee II
3. Aortic Atherosclerosis ([R1]-[R1]).

## 2020-07-12 MED ORDER — IOHEXOL 300 MG/ML  SOLN
100.0000 mL | Freq: Once | INTRAMUSCULAR | Status: AC | PRN
  Administered 2020-07-12: 100 mL via INTRAVENOUS

## 2020-07-12 MED ORDER — ACETAMINOPHEN 325 MG PO TABS
650.0000 mg | ORAL_TABLET | Freq: Four times a day (QID) | ORAL | Status: DC | PRN
  Administered 2020-07-12 – 2020-07-13 (×2): 650 mg via ORAL
  Filled 2020-07-12 (×2): qty 2

## 2020-07-12 MED ORDER — ACETAMINOPHEN 650 MG RE SUPP
650.0000 mg | Freq: Four times a day (QID) | RECTAL | Status: DC | PRN
  Filled 2020-07-12: qty 1

## 2020-07-12 MED ORDER — CIPROFLOXACIN IN D5W 400 MG/200ML IV SOLN
400.0000 mg | Freq: Once | INTRAVENOUS | Status: AC
  Administered 2020-07-12: 400 mg via INTRAVENOUS
  Filled 2020-07-12: qty 200

## 2020-07-12 MED ORDER — POTASSIUM CHLORIDE CRYS ER 20 MEQ PO TBCR
40.0000 meq | EXTENDED_RELEASE_TABLET | Freq: Once | ORAL | Status: AC
  Administered 2020-07-12: 40 meq via ORAL
  Filled 2020-07-12: qty 2

## 2020-07-12 MED ORDER — CLINDAMYCIN PHOSPHATE 600 MG/50ML IV SOLN
600.0000 mg | Freq: Once | INTRAVENOUS | Status: AC
  Administered 2020-07-12: 600 mg via INTRAVENOUS
  Filled 2020-07-12: qty 50

## 2020-07-12 MED ORDER — POLYETHYLENE GLYCOL 3350 17 G PO PACK: 17.0000 g | PACK | Freq: Every day | ORAL | Status: DC | PRN

## 2020-07-12 MED ORDER — VANCOMYCIN HCL IN DEXTROSE 1-5 GM/200ML-% IV SOLN: 1000.0000 mg | Freq: Once | INTRAVENOUS | Status: DC

## 2020-07-12 MED ORDER — INSULIN ASPART 100 UNIT/ML ~~LOC~~ SOLN
0.0000 [IU] | SUBCUTANEOUS | Status: DC
  Administered 2020-07-12: 3 [IU] via SUBCUTANEOUS
  Administered 2020-07-13 (×3): 1 [IU] via SUBCUTANEOUS
  Administered 2020-07-13: 2 [IU] via SUBCUTANEOUS
  Administered 2020-07-14: 1 [IU] via SUBCUTANEOUS
  Administered 2020-07-14: 2 [IU] via SUBCUTANEOUS
  Administered 2020-07-14 (×2): 1 [IU] via SUBCUTANEOUS
  Administered 2020-07-14 – 2020-07-15 (×2): 2 [IU] via SUBCUTANEOUS
  Administered 2020-07-15: 1 [IU] via SUBCUTANEOUS
  Administered 2020-07-16 (×3): 2 [IU] via SUBCUTANEOUS
  Administered 2020-07-16: 3 [IU] via SUBCUTANEOUS
  Administered 2020-07-16 (×2): 2 [IU] via SUBCUTANEOUS
  Administered 2020-07-16 – 2020-07-17 (×2): 3 [IU] via SUBCUTANEOUS
  Administered 2020-07-17: 2 [IU] via SUBCUTANEOUS
  Administered 2020-07-17 (×2): 3 [IU] via SUBCUTANEOUS
  Administered 2020-07-17: 2 [IU] via SUBCUTANEOUS
  Administered 2020-07-18: 3 [IU] via SUBCUTANEOUS
  Administered 2020-07-18: 5 [IU] via SUBCUTANEOUS
  Administered 2020-07-18: 3 [IU] via SUBCUTANEOUS
  Administered 2020-07-18: 7 [IU] via SUBCUTANEOUS
  Administered 2020-07-18: 5 [IU] via SUBCUTANEOUS
  Administered 2020-07-19: 2 [IU] via SUBCUTANEOUS
  Administered 2020-07-19: 5 [IU] via SUBCUTANEOUS
  Administered 2020-07-19: 7 [IU] via SUBCUTANEOUS
  Administered 2020-07-19: 5 [IU] via SUBCUTANEOUS
  Administered 2020-07-19: 3 [IU] via SUBCUTANEOUS
  Administered 2020-07-19 – 2020-07-20 (×4): 2 [IU] via SUBCUTANEOUS
  Administered 2020-07-20: 3 [IU] via SUBCUTANEOUS
  Administered 2020-07-21: 2 [IU] via SUBCUTANEOUS
  Administered 2020-07-21: 3 [IU] via SUBCUTANEOUS
  Administered 2020-07-21 – 2020-07-22 (×5): 2 [IU] via SUBCUTANEOUS
  Administered 2020-07-22: 1 [IU] via SUBCUTANEOUS
  Administered 2020-07-22: 5 [IU] via SUBCUTANEOUS
  Administered 2020-07-22: 3 [IU] via SUBCUTANEOUS
  Administered 2020-07-22: 1 [IU] via SUBCUTANEOUS
  Administered 2020-07-22: 2 [IU] via SUBCUTANEOUS
  Administered 2020-07-23: 3 [IU] via SUBCUTANEOUS
  Administered 2020-07-23 (×2): 5 [IU] via SUBCUTANEOUS
  Administered 2020-07-24 – 2020-07-25 (×4): 2 [IU] via SUBCUTANEOUS
  Administered 2020-07-25: 3 [IU] via SUBCUTANEOUS
  Filled 2020-07-12 (×2): qty 1

## 2020-07-12 MED ORDER — METRONIDAZOLE IN NACL 5-0.79 MG/ML-% IV SOLN
500.0000 mg | Freq: Three times a day (TID) | INTRAVENOUS | Status: DC
  Administered 2020-07-13 (×2): 500 mg via INTRAVENOUS
  Filled 2020-07-12 (×2): qty 100

## 2020-07-12 MED ORDER — POTASSIUM CHLORIDE 10 MEQ/100ML IV SOLN
10.0000 meq | INTRAVENOUS | Status: AC
  Administered 2020-07-12 – 2020-07-13 (×4): 10 meq via INTRAVENOUS
  Filled 2020-07-12 (×4): qty 100

## 2020-07-12 MED ORDER — VANCOMYCIN HCL 1500 MG/300ML IV SOLN
1500.0000 mg | Freq: Once | INTRAVENOUS | Status: AC
  Administered 2020-07-12: 1500 mg via INTRAVENOUS
  Filled 2020-07-12: qty 300

## 2020-07-12 MED ORDER — SODIUM CHLORIDE 0.9 % IV SOLN
2.0000 g | Freq: Three times a day (TID) | INTRAVENOUS | Status: DC
  Administered 2020-07-13 – 2020-07-16 (×11): 2 g via INTRAVENOUS
  Filled 2020-07-12 (×10): qty 2

## 2020-07-12 MED ORDER — LACTATED RINGERS IV BOLUS (SEPSIS)
1000.0000 mL | Freq: Once | INTRAVENOUS | Status: AC
  Administered 2020-07-12: 1000 mL via INTRAVENOUS

## 2020-07-12 MED ORDER — VANCOMYCIN HCL 750 MG/150ML IV SOLN
750.0000 mg | Freq: Three times a day (TID) | INTRAVENOUS | Status: DC
  Administered 2020-07-13 – 2020-07-17 (×11): 750 mg via INTRAVENOUS
  Filled 2020-07-12 (×16): qty 150

## 2020-07-12 MED ORDER — LACTATED RINGERS IV BOLUS (SEPSIS)
500.0000 mL | Freq: Once | INTRAVENOUS | Status: AC
  Administered 2020-07-12: 500 mL via INTRAVENOUS

## 2020-07-12 MED ORDER — ONDANSETRON HCL 4 MG PO TABS
4.0000 mg | ORAL_TABLET | Freq: Four times a day (QID) | ORAL | Status: DC | PRN
  Administered 2020-07-18: 4 mg via ORAL
  Filled 2020-07-12: qty 1

## 2020-07-12 MED ORDER — GABAPENTIN 300 MG PO CAPS
300.0000 mg | ORAL_CAPSULE | Freq: Three times a day (TID) | ORAL | Status: DC
  Administered 2020-07-12 – 2020-07-14 (×6): 300 mg via ORAL
  Filled 2020-07-12 (×7): qty 1

## 2020-07-12 MED ORDER — ONDANSETRON HCL 4 MG/2ML IJ SOLN: 4.0000 mg | Freq: Four times a day (QID) | INTRAMUSCULAR | Status: DC | PRN

## 2020-07-12 MED ORDER — LACTATED RINGERS IV SOLN: INTRAVENOUS | Status: DC

## 2020-07-12 NOTE — ED Notes (Signed)
Pt just updated family via phone call

## 2020-07-12 NOTE — ED Triage Notes (Signed)
Pt had right BKA, got infected, then right AKA and was sent to rehab; pt left AMA after 2 weeks and has been home with home health; pt sent to ER by home health nurse for pressure ulcer to sacrum and fever of 102.1

## 2020-07-12 NOTE — ED Notes (Signed)
Date and time results received: 06/23/2020 2043  Test: Lactic Acid Critical Value: 2.1  Name of Provider Notified: Emokpae  Orders Received? Or Actions Taken?: na

## 2020-07-12 NOTE — Progress Notes (Signed)
Pharmacy Antibiotic Note  Omar Todd is a 69 y.o. male admitted on 07/08/2020 with sepsis, large sacral decubitus ulcer.  Pharmacy has been consulted for Vancomycin and Cefepime dosing. Pt also on Flagyl.  Vanc load given in ED  Plan: Cefepime 2gm IV q8h Vancomycin 750 mg IV Q 8 hrs.  Will f/u renal function, micro data, and pt's clinical condition Vanc levels prn   Height: 6\' 2"  (188 cm) Weight: 78 kg (172 lb) IBW/kg (Calculated) : 82.2  Temp (24hrs), Avg:100.2 F (37.9 C), Min:100.2 F (37.9 C), Max:100.2 F (37.9 C)  Recent Labs  Lab 06/24/2020 1800 06/24/2020 1802 07/02/2020 2005 07/15/2020 2209  WBC 31.5*  --   --   --   CREATININE 0.62  --   --   --   LATICACIDVEN  --  1.4 2.1* 1.7    Estimated Creatinine Clearance: 96.1 mL/min (by C-G formula based on SCr of 0.62 mg/dL).    Allergies  Allergen Reactions  . Penicillins Other (See Comments)    Pt. States he "passed out"    Antimicrobials this admission: 8/22 Cipro x 1 8/22 Clinda x 1 8/22 Flagyl >> 8/22 Vanc >> 8/22 Cefepime>>  Microbiology results: 8/22 BCx:  8/22 UCx:   8/22 Wound cx:  Thank you for allowing pharmacy to be a part of this patient's care.  Sherlon Handing, PharmD, BCPS Please see amion for complete clinical pharmacist phone list 07/15/2020 10:50 PM

## 2020-07-12 NOTE — ED Notes (Signed)
22Date and time results received: 07/21/2020 *1845** (use smartphrase ".now" to insert current time)  Test: K Critical Value: 2.6  Name of Provider Notified: Donna Christen

## 2020-07-12 NOTE — H&P (Signed)
History and Physical    Omar Todd VZC:[id number] DOB: March 01, 1951 DOA: 06/27/2020  PCP: Omar Norlander, DO   Patient coming from: Home  I have personally briefly reviewed patient's old medical records in [place]  Chief Complaint: Fever, Buttock ulcer  HPI: Omar Todd is a 69 y.o. male with medical history significant for COPD, right AKA, DM, HTN, bilateral carotid artery disease. Patient was sent to the ED by home health RN for pressure ulcer to sacrum and temperature of 102.1.  In the time of my evaluation patient initially appeared to be asleep, but to talk she arouses, becomes awake and alert, answers a few questions, asks for a bedside urinal. Patient tells me he has had 2 ulcer since he left the hospital.  No difficulty breathing, no cough, no chest pain, no abdominal pain, no vomiting, no loose stools, denies pain with urination.  Recent hospitalization 7/ 29-06/22/20-for right BKA stump infection with mixed flexion contracture of the hip.  Patient had above-knee amputation by Omar Todd 06/19/2020.  Physical therapy recommended skilled nursing facility, but patient declined and opted to go home with home health.  ED Course: 100.2, tachycardic to 108, tachypneic to 24, blood pressure systolic 128N to 867E.  UA not suggestive of infection.  Potassium 2.6.  WBC 31.5.  Lactic acid 1.4 > 2.1. Chest x-ray without acute abnormality.  X-ray of the sacrum without acute bony abnormalities, no evidence of acute osteomyelitis, shows gas in the soft tissues overlying the lower right pelvis.  Patient was started on ciprofloxacin and clindamycin and vancomycin.  Hospitalist admit for further evaluation and management.  Review of Systems: As per HPI all other systems reviewed and negative.  Past Medical History:  Diagnosis Date  . COPD (chronic obstructive pulmonary disease) ([place])   . Diabetes mellitus without complication ([place])   . Gangrene of toe of right foot ([place])   . GERD  (gastroesophageal reflux disease)   . Hyperlipidemia   . Hypertension     Past Surgical History:  Procedure Laterality Date  . AMPUTATION Right 05/17/2020   Procedure: AMPUTATION BELOW RIGHT KNEE;  Surgeon: Omar Minion, Todd;  Location: [facility];  Service: Orthopedics;  Laterality: Right;  . AMPUTATION Right 06/19/2020   Procedure: RIGHT ABOVE KNEE AMPUTATION;  Surgeon: Omar Minion, Todd;  Location: [facility];  Service: Orthopedics;  Laterality: Right;  . APPLICATION OF WOUND VAC Right 05/17/2020   Procedure: APPLICATION OF WOUND VAC;  Surgeon: Omar Minion, Todd;  Location: [facility];  Service: Orthopedics;  Laterality: Right;  . BIOPSY  03/13/2020   Procedure: BIOPSY;  Surgeon: Omar Dolin, Todd;  [facility];  Service: Endoscopy;;  gastric biopsy  . ESOPHAGOGASTRODUODENOSCOPY N/A 03/13/2020   Procedure: ESOPHAGOGASTRODUODENOSCOPY (EGD);  Surgeon: Omar Dolin, Todd;  [facility];  Service: Endoscopy;  Laterality: N/A;  . FLEXIBLE SIGMOIDOSCOPY N/A 05/06/2020   Procedure: FLEXIBLE SIGMOIDOSCOPY;  Surgeon: Omar Dolin, Todd;  [facility];  Service: Endoscopy;  Laterality: N/A;  . Thumb surgery       reports that he has been smoking cigarettes. He has a 30.00 pack-year smoking history. He has never used smokeless tobacco. He reports that he does not drink alcohol and does not use drugs.  Allergies  Allergen Reactions  . Penicillins Other (See Comments)    Pt. States he "passed out"    Family History  Problem Relation Age of Onset  . Cancer Father   .  Diabetes Sister   . Stroke Brother   . Healthy Sister   . Healthy Sister   . Colon cancer Neg Hx   . Gastric cancer Neg Hx     Prior to Admission medications   Medication Sig Start Date End Date Taking? Authorizing Provider  amLODipine (NORVASC) 10 MG tablet Take 1 tablet (10 mg total) by mouth daily. 03/14/20   Omar Hockey, Todd  aspirin EC 81 MG tablet Take 1 tablet (81 mg total) by mouth daily  with breakfast. 03/14/20   Omar Todd  BIOTIN 5000 PO Take 5,000 mcg by mouth daily.     Provider, Historical, Todd  cholecalciferol (VITAMIN D) 1000 UNITS tablet Take 1,000 Units by mouth daily.    Provider, Historical, Todd  docusate sodium (COLACE) 100 MG capsule Take 1 capsule (100 mg total) by mouth 2 (two) times daily. 06/22/20   Omar Todd  empagliflozin (JARDIANCE) 25 MG TABS tablet Take 25 mg by mouth daily before breakfast. 03/14/20   Omar Todd  escitalopram (LEXAPRO) 10 MG tablet Take 1 tablet (10 mg total) by mouth daily. 03/14/20   Omar Hockey, Todd  ferrous sulfate 325 (65 FE) MG tablet Take 1 tablet (325 mg total) by mouth daily with breakfast. 03/14/20   Omar Todd  gabapentin (NEURONTIN) 300 MG capsule Take 1 capsule (300 mg total) by mouth 3 (three) times daily. 06/22/20   Omar Todd  hydrochlorothiazide (HYDRODIURIL) 25 MG tablet Take 1 tablet (25 mg total) by mouth daily. 03/14/20   Omar Hockey, Todd  lisinopril (ZESTRIL) 40 MG tablet TAKE ONE (1) TABLET EACH DAY Patient taking differently: Take 40 mg by mouth daily.  03/14/20   Omar Hockey, Todd  metFORMIN (GLUCOPHAGE) 1000 MG tablet Take 1 tablet (1,000 mg total) by mouth 2 (two) times daily with a meal. 03/14/20   Omar Todd  methocarbamol (ROBAXIN) 500 MG tablet Take 1 tablet (500 mg total) by mouth every 6 (six) hours as needed for muscle spasms. 06/22/20   Omar Todd  metoprolol tartrate (LOPRESSOR) 25 MG tablet Take 1 tablet (25 mg total) by mouth 2 (two) times daily. 03/14/20   Omar Todd  montelukast (SINGULAIR) 10 MG tablet TAKE ONE TABLET DAILY AT BEDTIME Patient taking differently: Take 10 mg by mouth at bedtime.  01/07/20   Omar Norlander, DO  oxyCODONE (ROXICODONE) 15 MG immediate release tablet Take 15 mg by mouth every 4 (four) hours as needed for pain.  06/03/20   Provider, Historical, Todd  pantoprazole (PROTONIX) 40 MG tablet Take 1 tablet  (40 mg total) by mouth daily. 03/15/20   Omar Hockey, Todd  pravastatin (PRAVACHOL) 40 MG tablet Take 1 tablet (40 mg total) by mouth every evening. 03/14/20   Jaquanda Wickersham, Courage, Todd  [name] (JANUVIA) 100 MG tablet Take 1 tablet (100 mg total) by mouth daily. 03/14/20   Omar Hockey, Todd    Physical Exam: Vitals:   07/08/2020 1815 07/10/2020 1830 07/21/2020 1900 07/10/2020 1930  BP:  (!) 113/56 (!) 126/52 (!) 139/57  Pulse: (!) 102     Resp: 19 20 15  (!) 21  Temp:      TempSrc:      SpO2: 100%     Weight:      Height:        Constitutional: NAD, calm, comfortable Vitals:   07/16/2020 1815 07/04/2020 1830 07/03/2020 1900 07/19/2020 1930  BP:  (!) 113/56 (!) 126/52 (!) 139/57  Pulse: (!) 102  Resp: 19 20 15  (!) 21  Temp:      TempSrc:      SpO2: 100%     Weight:      Height:       Eyes: PERRL, lids and conjunctivae normal ENMT: Mucous membranes are moist.   Neck: normal, supple, no masses, no thyromegaly Respiratory: clear to auscultation bilaterally, no wheezing, no crackles. Normal respiratory effort. No accessory muscle use.  Cardiovascular: Regular rate and rhythm, no murmurs / rubs / gallops. Right AKA, trace left extremity edema. 2+ pedal pulses.  Abdomen: no tenderness, no masses palpated. No hepatosplenomegaly. Bowel sounds positive.  Musculoskeletal: no clubbing / cyanosis. No joint deformity upper and lower extremities. Good ROM, no contractures. Normal muscle tone.  Skin: Right AKA stump, surgical incision and sutures still present, no significant surrounding erythema, or drainage, does not appear infected. Large ulcer to right buttock, stage III, with fecal soiling. Neurologic: 5/5 strength bilateral upper extremity, moving left lower extremities spontaneously, no apparent cranial nerve abnormality. Psychiatric: Normal judgment and insight.  Initially appeared to be asleep, arouses to touch, alert and oriented x person, not answering all questions. Normal mood.            Labs on Admission: I have personally reviewed following labs and imaging studies  CBC: Recent Labs  Lab 07/17/2020 1800  WBC 31.5*  NEUTROABS 28.9*  HGB 7.3*  HCT 25.1*  MCV 78.9*  PLT 951*   Basic Metabolic Panel: Recent Labs  Lab 06/30/2020 1800  NA 137  K 2.6*  CL 97*  CO2 26  GLUCOSE 213*  BUN 17  CREATININE 0.62  CALCIUM 8.2*    Liver Function Tests: Recent Labs  Lab 06/23/2020 1800  AST 28  ALT 24  ALKPHOS 117  BILITOT 0.9  PROT 5.7*  ALBUMIN 2.1*   Coagulation Profile: Recent Labs  Lab 07/11/2020 1800  INR 1.3*   Urine analysis:    Component Value Date/Time   COLORURINE YELLOW 06/29/2020 [place] 06/30/2020 1752   APPEARANCEUR Clear 08/08/2018 1119   LABSPEC 1.027 07/08/2020 1752   PHURINE 5.0 07/03/2020 1752   GLUCOSEU >=500 (A) 07/14/2020 1752   HGBUR MODERATE (A) 07/17/2020 1752   BILIRUBINUR NEGATIVE 06/29/2020 1752   BILIRUBINUR Negative 08/08/2018 1119   KETONESUR 20 (A) 07/19/2020 1752   PROTEINUR 30 (A) 06/26/2020 1752   UROBILINOGEN negative 10/31/2014 1002   NITRITE NEGATIVE 06/25/2020 1752   LEUKOCYTESUR NEGATIVE 06/26/2020 1752    Radiological Exams on Admission: DG Chest Port 1 View  Result Date: 07/13/2020 CLINICAL DATA:  Fever and sacral ulcer. EXAM: PORTABLE CHEST 1 VIEW COMPARISON:  The May 15, 2020 FINDINGS: There is no evidence of acute infiltrate, pleural effusion or pneumothorax. The heart size and mediastinal contours are within normal limits. There is mild calcification of the aortic arch and tortuosity of the descending thoracic aorta. The visualized skeletal structures are unremarkable. IMPRESSION: No active disease. Electronically Signed   By: Virgina Norfolk M.D.   On: 06/25/2020 18:37    EKG: Independently reviewed.  Sinus tachycardia rate 108, there QTc 408.  No significant ST or T wave changes compared to prior.  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  Temperature 100.2.  UA and portable chest x-ray not suggestive of infection.  Sacral x-ray - gas in the soft tissues overlying lower right pelvis, may represent decubitus ulcer, correlate clinically. -Obtain pelvic CT -General surgery consult, patient may benefit from debridement -IV vancomycin cefepime and metronidazole -BMP, CBC in the morning -2.5 L RL sepsis fluid bolus Todd, continue N/s 75cc/hr x 12 hrs -N.p.o. midnight  Hypokalemia-potassium 2.6.  Likely from HCTZ. -Replete -Check magnesium.  Recent right above-knee amputation-by Omar Todd 7/30, BKA >> AKA. Does not appear to be infected.  Followed up with Ortho 8/18.  COPD-stable. -As needed albuterol inhaler  Controlled diabetes mellitus with neuropathy-random glucose 213.  Hemoglobin A1c 6/25 was 8.3 - SSI- S -Hold Jardiance, Metformin and Januvia for now - resume gabapentin  Hypertension-stable. -Hold HCTZ, lisinopril, as patient will be getting contrast for CT - med rec pending, patient unable to tell me what he takes.   DVT prophylaxis: SCDs for now pending general surgery consult in the morning. Code Status: Full code Family Communication: None at bedside Disposition Plan: > 2 days.  Pending treatment of his sepsis, and general surgery evaluation of his decubitus ulcer. Consults called: General surgery Admission status: Inpatient, telemetry I certify that at the point of admission it is my clinical judgment that the patient will require inpatient hospital care spanning beyond 2 midnights from the point of admission due to high intensity of service, high risk for further deterioration and high frequency of  surveillance required.    Bethena Roys Todd [facility]  07/16/2020, 9:30 PM

## 2020-07-12 NOTE — ED Provider Notes (Addendum)
[facility] EMERGENCY DEPARTMENT Provider Note   CSN: [id number] Arrival date & time: 07/14/2020  1731     History Chief Complaint  Patient presents with  . Fever    Omar Todd is a 69 y.o. 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 Sharol Given who presents to the ED with complaints of fever.  Patient initially had a BKA 05/17/2020, but returned to the hospital 06/18/2020 for IV ABx in context of wound infection.  [facility] was [facility] evaluated [facility] on 07[facility] and was noted to be experiencing weakness and difficulty transferring.  However, the orthopedic exam was relatively benign and only noted to have scant clear drainage.  Plan was for continued dressing changes and follow-up 07/22/2020.  Patient is followed by [facility] and there is a note dated 06/29/2020 about phone call from patient's sister, [name], who reports that patient is failing to take care of himself at home and has been acting "crazy".  She called [facility], forensic and is concerned about his mental capacity.  Evidently, his home health nurse noted him to be febrile to 102.1 F with pressure ulcer on sacrum, prompting her to call EMS.    On my examination, patient is pleasant and answering questions appropriately.  However, he is also mildly altered and took a minute to remember when asked about his sister, [name], who is listed as his primary contact on our EMR.  He states that he sustained a fall couple of weeks ago which is why he has a large hematoma on the right side of his head.  However, patient is only complaining of "butt pain".  It was noted by his home health nurse that he has a sacral ulcer.  Patient is level 5 caveat due to his AMS, likely in context of sepsis.  HPI     Past Medical History:  Diagnosis Date  . COPD (chronic obstructive pulmonary disease) ([place])   . Diabetes mellitus without complication  ([place])   . Gangrene of toe of right foot ([place])   . GERD (gastroesophageal reflux disease)   . Hyperlipidemia   . Hypertension     Patient Active Problem List   Diagnosis Date Noted  . Sepsis ([place]) 06/25/2020  . Right above-knee amputee ([place]) 06/24/2020  . Right BKA infection ([place]) 06/18/2020  . Hyperglycemia due to diabetes mellitus ([place]) 06/18/2020  . Thrombocytosis ([place]) 06/18/2020  . Pressure injury of skin 05/16/2020  . Severe protein-calorie malnutrition ([place])   . Gangrene of right foot ([place]) 03/26/2020  . PAD (peripheral artery disease) ([place]) 03/26/2020  . Normocytic anemia 03/12/2020  . GI bleed 03/12/2020  . AAA (abdominal aortic aneurysm) without rupture ([place]) 02/28/2020  . Critical lower limb ischemia 02/28/2020  . BPH (benign prostatic hyperplasia) 10/31/2014  . Systolic ejection murmur 05/14/7627  . Bilateral carotid bruits 10/31/2014  . Erectile dysfunction 11/05/2013  . Bilateral carotid artery disease ([place]) 04/11/2013  . Hyperlipidemia associated with type 2 diabetes mellitus ([place]) 04/11/2013  . Hypertension associated with diabetes ([place]) 04/11/2013  . COPD exacerbation ([place]) 04/11/2013  . Diabetes mellitus type 2 controlled 04/11/2013  . Diabetic neuropathy, type II diabetes mellitus ([place]) 03/15/2013    Past Surgical History:  Procedure Laterality Date  . AMPUTATION Right 05/17/2020   Procedure: AMPUTATION BELOW RIGHT KNEE;  Surgeon: Newt Minion, MD;  Location: [facility];  Service: Orthopedics;  Laterality: Right;  . AMPUTATION Right 06/19/2020  Procedure: RIGHT ABOVE KNEE AMPUTATION;  Surgeon: Newt Minion, MD;  Location: [facility];  Service: Orthopedics;  Laterality: Right;  . APPLICATION OF WOUND VAC Right 05/17/2020   Procedure: APPLICATION OF WOUND VAC;  Surgeon: Newt Minion, MD;  Location: [facility];  Service: Orthopedics;  Laterality: Right;  . BIOPSY  03/13/2020   Procedure: BIOPSY;  Surgeon: Daneil Dolin, MD;  [facility];  Service:  Endoscopy;;  gastric biopsy  . ESOPHAGOGASTRODUODENOSCOPY N/A 03/13/2020   Procedure: ESOPHAGOGASTRODUODENOSCOPY (EGD);  Surgeon: Daneil Dolin, MD;  [facility];  Service: Endoscopy;  Laterality: N/A;  . FLEXIBLE SIGMOIDOSCOPY N/A 05/06/2020   Procedure: FLEXIBLE SIGMOIDOSCOPY;  Surgeon: Daneil Dolin, MD;  [facility];  Service: Endoscopy;  Laterality: N/A;  . Thumb surgery         Family History  Problem Relation Age of Onset  . Cancer Father   . Diabetes Sister   . Stroke Brother   . Healthy Sister   . Healthy Sister   . Colon cancer Neg Hx   . Gastric cancer Neg Hx     Social History   Tobacco Use  . Smoking status: Current Every Day Smoker    Packs/day: 0.50    Years: 60.00    Pack years: 30.00    Types: Cigarettes  . Smokeless tobacco: Never Used  Vaping Use  . Vaping Use: Former  Substance Use Topics  . Alcohol use: No    Alcohol/week: 0.0 standard drinks  . Drug use: No    Home Medications Prior to Admission medications   Medication Sig Start Date End Date Taking? Authorizing Provider  amLODipine (NORVASC) 10 MG tablet Take 1 tablet (10 mg total) by mouth daily. 03/14/20   Roxan Hockey, MD  aspirin EC 81 MG tablet Take 1 tablet (81 mg total) by mouth daily with breakfast. 03/14/20   Emokpae, Courage, MD  BIOTIN 5000 PO Take 5,000 mcg by mouth daily.     [provider]  cholecalciferol (VITAMIN D) 1000 UNITS tablet Take 1,000 Units by mouth daily.    [provider]  docusate sodium (COLACE) 100 MG capsule Take 1 capsule (100 mg total) by mouth 2 (two) times daily. 06/22/20   Pokhrel, Corrie Mckusick, MD  empagliflozin (JARDIANCE) 25 MG TABS tablet Take 25 mg by mouth daily before breakfast. 03/14/20   Emokpae, Courage, MD  escitalopram (LEXAPRO) 10 MG tablet Take 1 tablet (10 mg total) by mouth daily. 03/14/20   Roxan Hockey, MD  ferrous sulfate 325 (65 FE) MG tablet Take 1 tablet (325 mg total) by mouth daily with  breakfast. 03/14/20   [place] Brick, Courage, MD  gabapentin (NEURONTIN) 300 MG capsule Take 1 capsule (300 mg total) by mouth 3 (three) times daily. 06/22/20   Pokhrel, Corrie Mckusick, MD  hydrochlorothiazide (HYDRODIURIL) 25 MG tablet Take 1 tablet (25 mg total) by mouth daily. 03/14/20   Roxan Hockey, MD  lisinopril (ZESTRIL) 40 MG tablet TAKE ONE (1) TABLET EACH DAY Patient taking differently: Take 40 mg by mouth daily.  03/14/20   Roxan Hockey, MD  metFORMIN (GLUCOPHAGE) 1000 MG tablet Take 1 tablet (1,000 mg total) by mouth 2 (two) times daily with a meal. 03/14/20   Emokpae, Courage, MD  methocarbamol (ROBAXIN) 500 MG tablet Take 1 tablet (500 mg total) by mouth every 6 (six) hours as needed for muscle spasms. 06/22/20   Pokhrel, Corrie Mckusick, MD  metoprolol tartrate (LOPRESSOR) 25 MG tablet Take 1 tablet (25 mg  total) by mouth 2 (two) times daily. 03/14/20   Emokpae, Courage, MD  montelukast (SINGULAIR) 10 MG tablet TAKE ONE TABLET DAILY AT BEDTIME Patient taking differently: Take 10 mg by mouth at bedtime.  01/07/20   [name], DO  oxyCODONE (ROXICODONE) 15 MG immediate release tablet Take 15 mg by mouth every 4 (four) hours as needed for pain.  06/03/20   [provider]  pantoprazole (PROTONIX) 40 MG tablet Take 1 tablet (40 mg total) by mouth daily. 03/15/20   Roxan Hockey, MD  pravastatin (PRAVACHOL) 40 MG tablet Take 1 tablet (40 mg total) by mouth every evening. 03/14/20   Emokpae, Courage, MD  [name] (JANUVIA) 100 MG tablet Take 1 tablet (100 mg total) by mouth daily. 03/14/20   Roxan Hockey, MD    Allergies    Penicillins  Review of Systems   Review of Systems  Unable to perform ROS: Mental status change    Physical Exam Updated Vital Signs BP (!) 139/57   Pulse (!) 102   Temp 100.2 F (37.9 C) (Oral)   Resp (!) 21   Ht 6\' 2"  (1.88 m)   Wt 78 kg   SpO2 100%   BMI 22.08 kg/m   Physical Exam Vitals and nursing note reviewed. Exam conducted with a chaperone  present.  Constitutional:      General: He is not in acute distress.    Appearance: He is ill-appearing.  HENT:     Head: Normocephalic.     Comments: Hematoma on right side of head, reportedly from fall sustained weeks ago. Eyes:     General: No scleral icterus.    Conjunctiva/sclera: Conjunctivae normal.  Cardiovascular:     Rate and Rhythm: Regular rhythm. Tachycardia present.     Pulses: Normal pulses.  Pulmonary:     Effort: Pulmonary effort is normal. No respiratory distress.     Breath sounds: Normal breath sounds.  Musculoskeletal:     Cervical back: Normal range of motion and neck supple. No rigidity.       Legs:     Comments: 5 x 5 cm malodorous, purulent stage III/IV pressure ulcer with fecal contamination.   Skin:    General: Skin is dry.     Capillary Refill: Capillary refill takes less than 2 seconds.  Neurological:     Mental Status: He is alert.     GCS: GCS eye subscore is 4. GCS verbal subscore is 5. GCS motor subscore is 6.     Comments: Confused.  Had trouble remembering his sister [name].  Answering questions appropriately.  Psychiatric:        Mood and Affect: Mood normal.        Behavior: Behavior normal.        Thought Content: Thought content normal.         ED Results / Procedures / Treatments   Labs (all labs ordered are listed, but only abnormal results are displayed) Labs Reviewed  COMPREHENSIVE METABOLIC PANEL - Abnormal; Notable for the following components:      Result Value   Potassium 2.6 (*)    Chloride 97 (*)    Glucose, Bld 213 (*)    Calcium 8.2 (*)    Total Protein 5.7 (*)    Albumin 2.1 (*)    All other components within normal limits  CBC WITH DIFFERENTIAL/PLATELET - Abnormal; Notable for the following components:   WBC 31.5 (*)    RBC 3.18 (*)    Hemoglobin 7.3 (*)  HCT 25.1 (*)    MCV 78.9 (*)    MCH 23.0 (*)    MCHC 29.1 (*)    RDW 17.3 (*)    Platelets 425 (*)    Neutro Abs 28.9 (*)    Lymphs Abs 0.4 (*)     Abs Immature Granulocytes 1.31 (*)    All other components within normal limits  PROTIME-INR - Abnormal; Notable for the following components:   INR 1.3 (*)    All other components within normal limits  URINALYSIS, ROUTINE W REFLEX MICROSCOPIC - Abnormal; Notable for the following components:   Glucose, UA >=500 (*)    Hgb urine dipstick MODERATE (*)    Ketones, ur 20 (*)    Protein, ur 30 (*)    All other components within normal limits  CULTURE, BLOOD (ROUTINE X 2)  CULTURE, BLOOD (ROUTINE X 2)  URINE CULTURE  AEROBIC CULTURE (SUPERFICIAL SPECIMEN)  SARS CORONAVIRUS 2 BY RT PCR (HOSPITAL ORDER, [facility])  LACTIC ACID, PLASMA  APTT  LACTIC ACID, PLASMA    EKG None  Radiology DG Chest Port 1 View  Result Date: 07/03/2020 CLINICAL DATA:  Fever and sacral ulcer. EXAM: PORTABLE CHEST 1 VIEW COMPARISON:  The May 15, 2020 FINDINGS: There is no evidence of acute infiltrate, pleural effusion or pneumothorax. The heart size and mediastinal contours are within normal limits. There is mild calcification of the aortic arch and tortuosity of the descending thoracic aorta. The visualized skeletal structures are unremarkable. IMPRESSION: No active disease. Electronically Signed   By: Virgina Norfolk M.D.   On: 06/21/2020 18:37    Procedures Procedures (including critical care time)  Medications Ordered in ED Medications  lactated ringers infusion ( Intravenous New Bag/Given (Non-Interop) 06/27/2020 1844)  ciprofloxacin (CIPRO) IVPB 400 mg (400 mg Intravenous New Bag/Given 07/11/2020 1851)  vancomycin (VANCOREADY) IVPB 1500 mg/300 mL (has no administration in time range)  potassium chloride 10 mEq in 100 mL IVPB (has no administration in time range)  potassium chloride SA (KLOR-CON) CR tablet 40 mEq (has no administration in time range)  lactated ringers bolus 1,000 mL (1,000 mLs Intravenous New Bag/Given 06/27/2020 1853)    And  lactated ringers bolus 1,000 mL  (1,000 mLs Intravenous New Bag/Given 06/26/2020 1853)    And  lactated ringers bolus 500 mL (500 mLs Intravenous New Bag/Given 07/11/2020 1855)  clindamycin (CLEOCIN) IVPB 600 mg (0 mg Intravenous Stopped 06/24/2020 1913)    ED Course  I have reviewed the triage vital signs and the nursing notes.  Pertinent labs & imaging results that were available during my care of the patient were reviewed by me and considered in my medical decision making (see chart for details).  Clinical Course as of Jul 12 1940  Sun Jul 12, 2020  1939 Spoke with Dr. Denton Brick who will see and admit patient.   [GG]    Clinical Course User Index [GG] Reita Chard   MDM Rules/Calculators/A&P                          Presents to the ED meeting SIRS criteria.  Sepsis work-up was initiated.  Patient was empirically started on IVF as well as ciprofloxacin, clindamycin, and vancomycin.    Labs CBC with differential: New leukocytosis to 31.5, WNL and labs obtained 2 weeks ago.  Anemia with hemoglobin of 7.3, consistent with baseline. CMP: Notable for hypokalemia to 2.6. UA: Glucosuria, hematuria, and ketones.  Cultures: Pending.  Imaging DG chest portable was personally reviewed and demonstrates no acute cardiopulmonary disease. DG sacrum/coccyx: Ordered and still pending.  Patient will be admitted for his sepsis, presumably secondary to his large sacral region ulcer.  Wound culture was obtained, in addition to blood cultures.   Spoke with Dr. Denton Brick who will see and admit patient.  Final Clinical Impression(s) / ED Diagnoses Final diagnoses:  SIRS (systemic inflammatory response syndrome) ([place])  Skin ulcer of sacrum, unspecified ulcer stage [facility])    Rx / DC Orders ED Discharge Orders    None       Reita Chard 07/11/2020 1941    Milton Ferguson, MD 06/24/2020 1024    Corena Herter, PA-C 08/11/20 2979    Milton Ferguson, MD 08/12/20 1555

## 2020-07-13 ENCOUNTER — Inpatient Hospital Stay (HOSPITAL_COMMUNITY): Payer: Medicare Other | Admitting: Anesthesiology

## 2020-07-13 ENCOUNTER — Inpatient Hospital Stay (HOSPITAL_COMMUNITY): Payer: Medicare Other

## 2020-07-13 ENCOUNTER — Encounter (HOSPITAL_COMMUNITY): Admission: EM | Disposition: E | Payer: Self-pay | Source: Home / Self Care | Attending: Pulmonary Disease

## 2020-07-13 DIAGNOSIS — T8149XA Infection following a procedure, other surgical site, initial encounter: Secondary | ICD-10-CM

## 2020-07-13 DIAGNOSIS — M726 Necrotizing fasciitis: Secondary | ICD-10-CM

## 2020-07-13 DIAGNOSIS — N493 Fournier gangrene: Secondary | ICD-10-CM

## 2020-07-13 DIAGNOSIS — N400 Enlarged prostate without lower urinary tract symptoms: Secondary | ICD-10-CM

## 2020-07-13 DIAGNOSIS — E1140 Type 2 diabetes mellitus with diabetic neuropathy, unspecified: Secondary | ICD-10-CM

## 2020-07-13 DIAGNOSIS — Z89611 Acquired absence of right leg above knee: Secondary | ICD-10-CM

## 2020-07-13 HISTORY — PX: INCISION AND DRAINAGE: SHX5863

## 2020-07-13 HISTORY — PX: INCISION AND DRAINAGE OF WOUND: SHX1803

## 2020-07-13 LAB — GLUCOSE, CAPILLARY
Glucose-Capillary: 147 mg/dL — ABNORMAL HIGH (ref 70–99)
Glucose-Capillary: 170 mg/dL — ABNORMAL HIGH (ref 70–99)
Glucose-Capillary: 129 mg/dL — ABNORMAL HIGH (ref 70–99)
Glucose-Capillary: 144 mg/dL — ABNORMAL HIGH (ref 70–99)

## 2020-07-13 LAB — BLOOD GAS, ARTERIAL
Acid-Base Excess: 1.7 mmol/L (ref 0.0–2.0)
Bicarbonate: 26 mmol/L (ref 20.0–28.0)
FIO2: 50
O2 Saturation: 98.2 %
Patient temperature: 37.6
pCO2 arterial: 35.8 mmHg (ref 32.0–48.0)
pH, Arterial: 7.463 — ABNORMAL HIGH (ref 7.350–7.450)
pO2, Arterial: 103 mmHg (ref 83.0–108.0)

## 2020-07-13 LAB — CBC
HCT: 24.6 % — ABNORMAL LOW (ref 39.0–52.0)
Hemoglobin: 7 g/dL — ABNORMAL LOW (ref 13.0–17.0)
MCH: 22.9 pg — ABNORMAL LOW (ref 26.0–34.0)
MCHC: 28.5 g/dL — ABNORMAL LOW (ref 30.0–36.0)
MCV: 80.4 fL (ref 80.0–100.0)
Platelets: 394 10*3/uL (ref 150–400)
RBC: 3.06 MIL/uL — ABNORMAL LOW (ref 4.22–5.81)
RDW: 17.5 % — ABNORMAL HIGH (ref 11.5–15.5)
WBC: 32.1 10*3/uL — ABNORMAL HIGH (ref 4.0–10.5)
nRBC: 0 % (ref 0.0–0.2)

## 2020-07-13 LAB — BASIC METABOLIC PANEL
Anion gap: 11 (ref 5–15)
BUN: 12 mg/dL (ref 8–23)
CO2: 23 mmol/L (ref 22–32)
Calcium: 7.8 mg/dL — ABNORMAL LOW (ref 8.9–10.3)
Chloride: 102 mmol/L (ref 98–111)
Creatinine, Ser: 0.49 mg/dL — ABNORMAL LOW (ref 0.61–1.24)
GFR calc Af Amer: >60 mL/min (ref >60)
GFR calc non Af Amer: >60 mL/min (ref >60)
Glucose, Bld: 127 mg/dL — ABNORMAL HIGH (ref 70–99)
Potassium: 2.9 mmol/L — ABNORMAL LOW (ref 3.5–5.1)
Sodium: 136 mmol/L (ref 135–145)

## 2020-07-13 LAB — URINE CULTURE

## 2020-07-13 LAB — PREPARE RBC (CROSSMATCH)

## 2020-07-13 LAB — CBG MONITORING, ED: Glucose-Capillary: 125 mg/dL — ABNORMAL HIGH (ref 70–99)

## 2020-07-13 IMAGING — DX DG CHEST 1V PORT
1 series · 1 of 1 positions shown · non-contrast
Comparison: [DATE]

CLINICAL DATA: Respiratory failure

EXAM:
PORTABLE CHEST 1 VIEW

[chest ap]
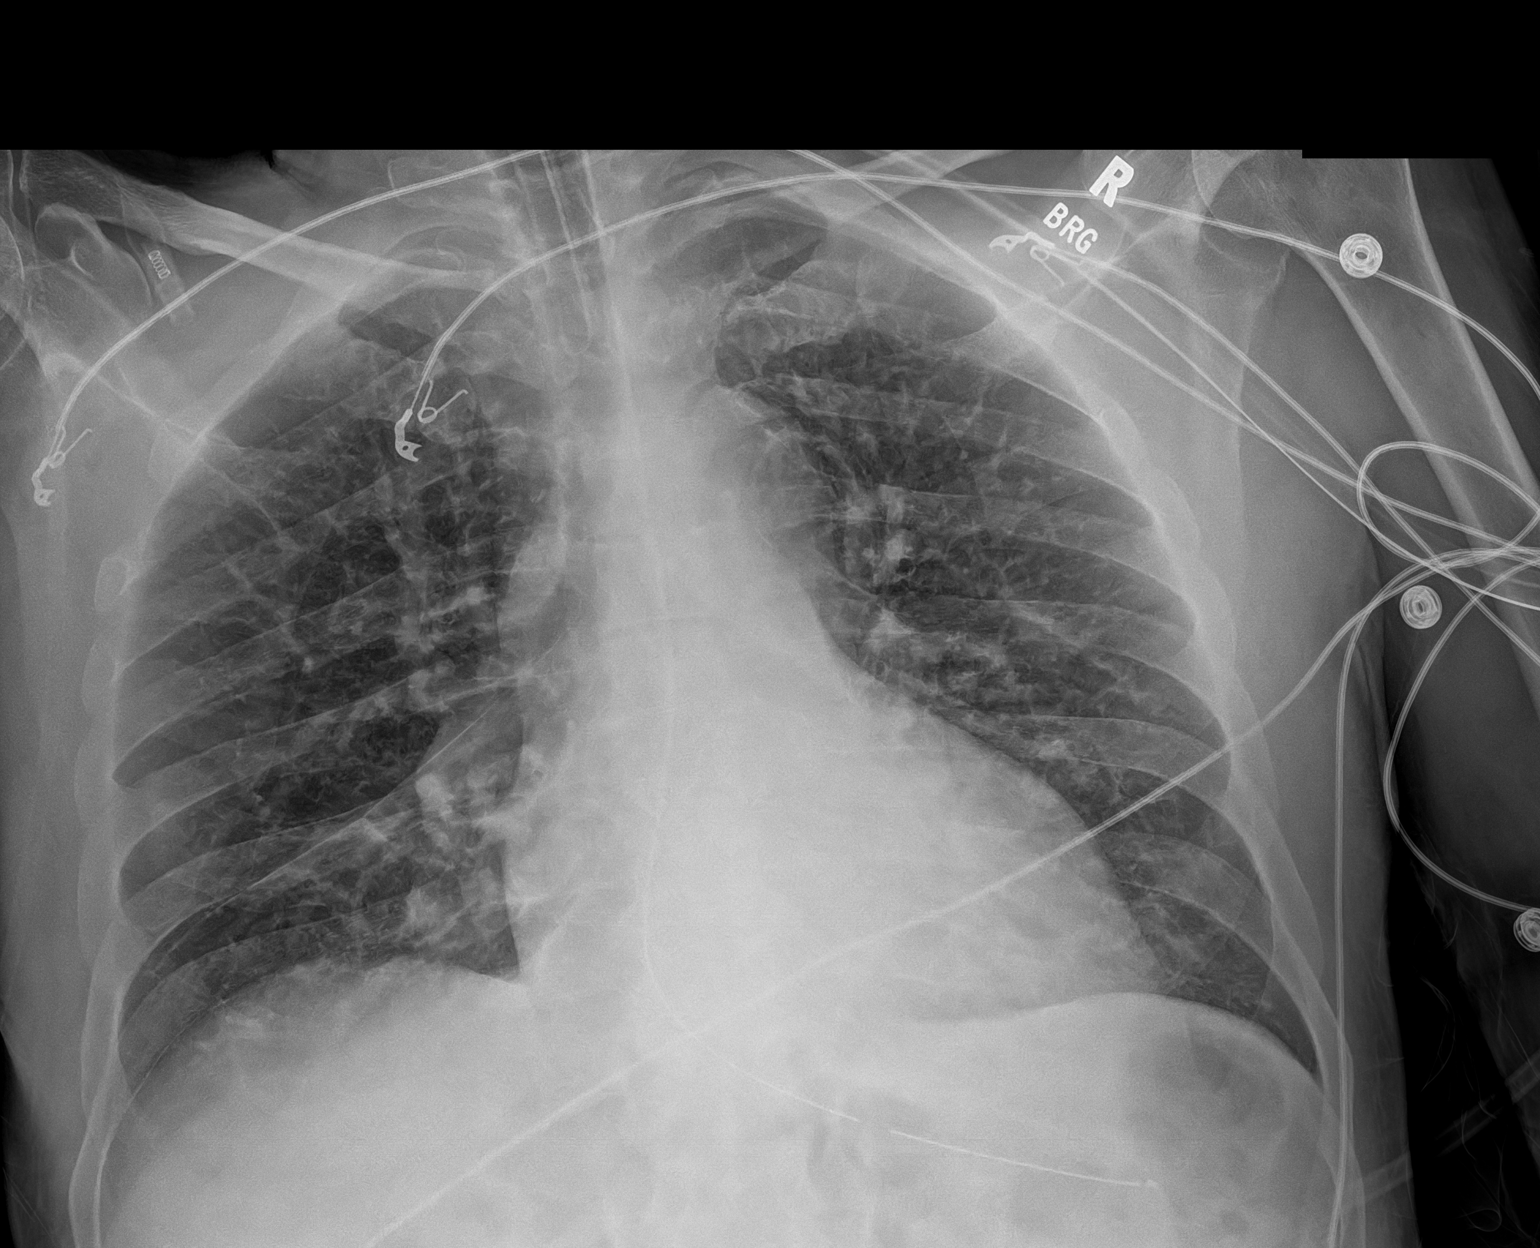

[1 of 1 positions shown; findings below may reference images not displayed]

FINDINGS: Endotracheal tube and gastric catheter are now seen in satisfactory
position. Cardiac shadow is stable. Lungs are well aerated
bilaterally. No focal infiltrate or sizable effusion is seen. No
pneumothorax is noted. No bony abnormality is noted.
IMPRESSION: Interval intubation.  No acute abnormality is noted.

## 2020-07-13 SURGERY — IRRIGATION AND DEBRIDEMENT WOUND
Anesthesia: General | Site: Leg Upper | Laterality: Right

## 2020-07-13 MED ORDER — FENTANYL CITRATE (PF) 100 MCG/2ML IJ SOLN
25.0000 ug | Freq: Once | INTRAMUSCULAR | Status: AC
  Administered 2020-07-13: 25 ug via INTRAVENOUS

## 2020-07-13 MED ORDER — CHLORHEXIDINE GLUCONATE CLOTH 2 % EX PADS: 6.0000 | MEDICATED_PAD | Freq: Once | CUTANEOUS | Status: DC

## 2020-07-13 MED ORDER — PROPOFOL 1000 MG/100ML IV EMUL
0.0000 ug/kg/min | INTRAVENOUS | Status: DC
  Administered 2020-07-13: 10 ug/kg/min via INTRAVENOUS
  Administered 2020-07-14: 20 ug/kg/min via INTRAVENOUS
  Administered 2020-07-14: 10 ug/kg/min via INTRAVENOUS
  Administered 2020-07-15 – 2020-07-17 (×5): 20 ug/kg/min via INTRAVENOUS
  Administered 2020-07-17: 40 ug/kg/min via INTRAVENOUS
  Administered 2020-07-17 (×2): 30 ug/kg/min via INTRAVENOUS
  Administered 2020-07-17: 40 ug/kg/min via INTRAVENOUS
  Administered 2020-07-18: 30 ug/kg/min via INTRAVENOUS
  Filled 2020-07-13 (×14): qty 100

## 2020-07-13 MED ORDER — FENTANYL CITRATE (PF) 100 MCG/2ML IJ SOLN
INTRAMUSCULAR | Status: DC | PRN
Start: 2020-07-13 — End: 2020-07-13
  Administered 2020-07-13 (×2): 50 ug via INTRAVENOUS

## 2020-07-13 MED ORDER — LIDOCAINE 2% (20 MG/ML) 5 ML SYRINGE
INTRAMUSCULAR | Status: DC | PRN
  Administered 2020-07-13: 50 mg via INTRAVENOUS

## 2020-07-13 MED ORDER — LACTATED RINGERS IV SOLN: INTRAVENOUS | Status: DC | PRN

## 2020-07-13 MED ORDER — FENTANYL CITRATE (PF) 100 MCG/2ML IJ SOLN: 25.0000 ug | INTRAMUSCULAR | Status: DC | PRN

## 2020-07-13 MED ORDER — DEXMEDETOMIDINE HCL IN NACL 200 MCG/50ML IV SOLN
INTRAVENOUS | Status: DC | PRN
  Administered 2020-07-13: .5 ug/kg/h via INTRAVENOUS

## 2020-07-13 MED ORDER — POTASSIUM CHLORIDE 10 MEQ/100ML IV SOLN
10.0000 meq | INTRAVENOUS | Status: DC
  Administered 2020-07-13 (×2): 10 meq via INTRAVENOUS
  Filled 2020-07-13: qty 100

## 2020-07-13 MED ORDER — LACTATED RINGERS IV SOLN: INTRAVENOUS | Status: DC

## 2020-07-13 MED ORDER — CLINDAMYCIN PHOSPHATE 600 MG/50ML IV SOLN
600.0000 mg | Freq: Four times a day (QID) | INTRAVENOUS | Status: AC
  Administered 2020-07-13 – 2020-07-18 (×19): 600 mg via INTRAVENOUS
  Filled 2020-07-13 (×18): qty 50

## 2020-07-13 MED ORDER — DAKINS (1/4 STRENGTH) 0.125 % EX SOLN
Freq: Once | CUTANEOUS | Status: DC
  Filled 2020-07-13: qty 473

## 2020-07-13 MED ORDER — CHLORHEXIDINE GLUCONATE CLOTH 2 % EX PADS
6.0000 | MEDICATED_PAD | Freq: Every day | CUTANEOUS | Status: DC
  Administered 2020-07-13 – 2020-07-25 (×14): 6 via TOPICAL

## 2020-07-13 MED ORDER — SODIUM CHLORIDE 0.9 % IV BOLUS
1000.0000 mL | Freq: Once | INTRAVENOUS | Status: AC
  Administered 2020-07-13: 1000 mL via INTRAVENOUS

## 2020-07-13 MED ORDER — ORAL CARE MOUTH RINSE
15.0000 mL | OROMUCOSAL | Status: DC
  Administered 2020-07-13 – 2020-07-25 (×105): 15 mL via OROMUCOSAL

## 2020-07-13 MED ORDER — ROCURONIUM BROMIDE 10 MG/ML (PF) SYRINGE
PREFILLED_SYRINGE | INTRAVENOUS | Status: DC | PRN
  Administered 2020-07-13 (×3): 10 mg via INTRAVENOUS
  Administered 2020-07-13: 20 mg via INTRAVENOUS
  Administered 2020-07-13: 10 mg via INTRAVENOUS
  Administered 2020-07-13: 40 mg via INTRAVENOUS

## 2020-07-13 MED ORDER — DOCUSATE SODIUM 50 MG/5ML PO LIQD
100.0000 mg | Freq: Two times a day (BID) | ORAL | Status: DC
  Filled 2020-07-13 (×5): qty 10

## 2020-07-13 MED ORDER — 0.9 % SODIUM CHLORIDE (POUR BTL) OPTIME
TOPICAL | Status: DC | PRN
  Administered 2020-07-13: 1000 mL

## 2020-07-13 MED ORDER — CHLORHEXIDINE GLUCONATE 0.12 % MT SOLN: 15.0000 mL | Freq: Once | OROMUCOSAL | Status: DC

## 2020-07-13 MED ORDER — PROPOFOL 10 MG/ML IV BOLUS
INTRAVENOUS | Status: AC
  Filled 2020-07-13: qty 40

## 2020-07-13 MED ORDER — SODIUM CHLORIDE 0.9% IV SOLUTION: Freq: Once | INTRAVENOUS | Status: DC

## 2020-07-13 MED ORDER — POLYETHYLENE GLYCOL 3350 17 G PO PACK
17.0000 g | PACK | Freq: Every day | ORAL | Status: DC
  Filled 2020-07-13 (×4): qty 1

## 2020-07-13 MED ORDER — PHENYLEPHRINE HCL-NACL 10-0.9 MG/250ML-% IV SOLN
0.0000 ug/min | INTRAVENOUS | Status: DC
  Administered 2020-07-13 (×2): 50 ug/min via INTRAVENOUS
  Administered 2020-07-14 (×2): 70 ug/min via INTRAVENOUS
  Administered 2020-07-14: 100 ug/min via INTRAVENOUS
  Filled 2020-07-13 (×2): qty 500

## 2020-07-13 MED ORDER — FENTANYL 2500MCG IN NS 250ML (10MCG/ML) PREMIX INFUSION
25.0000 ug/h | INTRAVENOUS | Status: DC
  Administered 2020-07-13: 25 ug/h via INTRAVENOUS
  Administered 2020-07-14 – 2020-07-17 (×5): 125 ug/h via INTRAVENOUS
  Administered 2020-07-18: 200 ug/h via INTRAVENOUS
  Administered 2020-07-18: 75 ug/h via INTRAVENOUS
  Administered 2020-07-19: 300 ug/h via INTRAVENOUS
  Administered 2020-07-19: 200 ug/h via INTRAVENOUS
  Administered 2020-07-20: 300 ug/h via INTRAVENOUS
  Administered 2020-07-20: 200 ug/h via INTRAVENOUS
  Administered 2020-07-20 – 2020-07-21 (×4): 300 ug/h via INTRAVENOUS
  Administered 2020-07-22: 225 ug/h via INTRAVENOUS
  Administered 2020-07-22: 300 ug/h via INTRAVENOUS
  Administered 2020-07-23: 225 ug/h via INTRAVENOUS
  Filled 2020-07-13 (×20): qty 250

## 2020-07-13 MED ORDER — PROPOFOL 10 MG/ML IV BOLUS
INTRAVENOUS | Status: DC | PRN
  Administered 2020-07-13: 20 mg via INTRAVENOUS
  Administered 2020-07-13: 120 mg via INTRAVENOUS

## 2020-07-13 MED ORDER — PANTOPRAZOLE SODIUM 40 MG IV SOLR
40.0000 mg | Freq: Every day | INTRAVENOUS | Status: DC
  Administered 2020-07-13 – 2020-07-21 (×9): 40 mg via INTRAVENOUS
  Filled 2020-07-13 (×12): qty 40

## 2020-07-13 MED ORDER — FENTANYL BOLUS VIA INFUSION
25.0000 ug | INTRAVENOUS | Status: DC | PRN
  Administered 2020-07-15 – 2020-07-22 (×15): 25 ug via INTRAVENOUS
  Filled 2020-07-13: qty 25

## 2020-07-13 MED ORDER — PHENYLEPHRINE 40 MCG/ML (10ML) SYRINGE FOR IV PUSH (FOR BLOOD PRESSURE SUPPORT)
PREFILLED_SYRINGE | INTRAVENOUS | Status: DC | PRN
  Administered 2020-07-13 (×2): 80 ug via INTRAVENOUS

## 2020-07-13 MED ORDER — DEXMEDETOMIDINE HCL IN NACL 200 MCG/50ML IV SOLN
INTRAVENOUS | Status: AC
  Filled 2020-07-13: qty 50

## 2020-07-13 MED ORDER — FENTANYL 2500MCG IN NS 250ML (10MCG/ML) PREMIX INFUSION: 0.0000 ug/h | INTRAVENOUS | Status: DC

## 2020-07-13 MED ORDER — FENTANYL CITRATE (PF) 250 MCG/5ML IJ SOLN
INTRAMUSCULAR | Status: AC
  Filled 2020-07-13: qty 5

## 2020-07-13 MED ORDER — PHENYLEPHRINE HCL-NACL 20-0.9 MG/250ML-% IV SOLN
INTRAVENOUS | Status: DC | PRN
  Administered 2020-07-13: 40 ug/min via INTRAVENOUS

## 2020-07-13 MED ORDER — ORAL CARE MOUTH RINSE: 15.0000 mL | Freq: Once | OROMUCOSAL | Status: DC

## 2020-07-13 MED ORDER — ONDANSETRON HCL 4 MG/2ML IJ SOLN: 4.0000 mg | Freq: Once | INTRAMUSCULAR | Status: DC | PRN

## 2020-07-13 MED ORDER — PHENYLEPHRINE HCL (PRESSORS) 10 MG/ML IV SOLN
INTRAVENOUS | Status: AC
  Filled 2020-07-13: qty 2

## 2020-07-13 MED ORDER — DAKINS (1/4 STRENGTH) 0.125 % EX SOLN
CUTANEOUS | Status: DC | PRN
  Administered 2020-07-13: 1

## 2020-07-13 MED ORDER — MAGNESIUM SULFATE 2 GM/50ML IV SOLN
2.0000 g | Freq: Once | INTRAVENOUS | Status: AC
  Administered 2020-07-13: 2 g via INTRAVENOUS
  Filled 2020-07-13: qty 50

## 2020-07-13 MED ORDER — CHLORHEXIDINE GLUCONATE 0.12% ORAL RINSE (MEDLINE KIT)
15.0000 mL | Freq: Two times a day (BID) | OROMUCOSAL | Status: DC
  Administered 2020-07-13 – 2020-07-26 (×26): 15 mL via OROMUCOSAL

## 2020-07-13 SURGICAL SUPPLY — 29 items
BLADE 10 SAFETY STRL DISP (BLADE) ×8 IMPLANT
BNDG GAUZE ELAST 4 BULKY (GAUZE/BANDAGES/DRESSINGS) ×8 IMPLANT
CLOTH BEACON ORANGE TIMEOUT ST (SAFETY) ×4 IMPLANT
COVER LIGHT HANDLE STERIS (MISCELLANEOUS) ×8 IMPLANT
COVER WAND RF STERILE (DRAPES) ×4 IMPLANT
ELECT REM PT RETURN 9FT ADLT (ELECTROSURGICAL) ×4
ELECTRODE REM PT RTRN 9FT ADLT (ELECTROSURGICAL) ×2 IMPLANT
GAUZE SPONGE 4X4 12PLY STRL (GAUZE/BANDAGES/DRESSINGS) ×4 IMPLANT
GLOVE BIO SURGEON STRL SZ 6.5 (GLOVE) ×3 IMPLANT
GLOVE BIO SURGEON STRL SZ7 (GLOVE) ×4 IMPLANT
GLOVE BIO SURGEONS STRL SZ 6.5 (GLOVE) ×1
GLOVE BIOGEL PI IND STRL 6.5 (GLOVE) ×2 IMPLANT
GLOVE BIOGEL PI IND STRL 7.0 (GLOVE) ×2 IMPLANT
GLOVE BIOGEL PI INDICATOR 6.5 (GLOVE) ×2
GLOVE BIOGEL PI INDICATOR 7.0 (GLOVE) ×2
GOWN STRL REUS W/TWL LRG LVL3 (GOWN DISPOSABLE) ×16 IMPLANT
GOWN STRL REUS W/TWL XL LVL3 (GOWN DISPOSABLE) ×4 IMPLANT
KIT TURNOVER KIT A (KITS) ×4 IMPLANT
MANIFOLD NEPTUNE II (INSTRUMENTS) ×4 IMPLANT
NS IRRIG 1000ML POUR BTL (IV SOLUTION) ×4 IMPLANT
PACK MINOR (CUSTOM PROCEDURE TRAY) ×4 IMPLANT
PAD ABD 5X9 TENDERSORB (GAUZE/BANDAGES/DRESSINGS) ×8 IMPLANT
PAD ARMBOARD 7.5X6 YLW CONV (MISCELLANEOUS) ×4 IMPLANT
SET BASIN LINEN APH (SET/KITS/TRAYS/PACK) ×4 IMPLANT
SPONGE LAP 18X18 RF (DISPOSABLE) ×12 IMPLANT
SWAB CULTURE ESWAB REG 1ML (MISCELLANEOUS) ×8 IMPLANT
SWAB CULTURE LIQ STUART DBL (MISCELLANEOUS) ×8 IMPLANT
TRAY FOL W/BAG SLVR 16FR STRL (SET/KITS/TRAYS/PACK) ×2 IMPLANT
TRAY FOLEY W/BAG SLVR 16FR LF (SET/KITS/TRAYS/PACK) ×4

## 2020-07-13 NOTE — Consult Note (Signed)
[facility] Consult  Reason for Consult: Right buttock decubitus  Referring Physician:  Dr. Roderic Palau  Chief Complaint    Fever      HPI: Omar Todd is a 69 y.o. 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 He has been at home and his sister, Omar Todd has been checking on him regularly. He has been doing some physical therapy but otherwise has been at home alone.  He reportedly had a decubitus ulcer that started in the hospital and was the size of a quarter per [facility] and has grown extensively in the last 2 weeks. She says that yesterday when she went to check on him he was extremely tender in his groin region and on the scrotum and the area was red. She reports that he has a wife at [facility] with dementia and estranged children he has not seen since they were babies. She is his closest relative and Media planner. She does not have health care power of attorney.    She has not had conversations with Jeneen Rinks about his wishes for resuscitation.  On my initial assessment of Omar Todd he was only oriented to self but on repeat examination he was oriented to self, year, city, and understood that he needed surgery. He was able to cite Omar Todd as his Media planner and gave the RN her number.   Past Medical History:  Diagnosis Date  . COPD (chronic obstructive pulmonary disease) ([place])   . Diabetes mellitus without complication ([place])   . Gangrene of toe of right foot ([place])   . GERD (gastroesophageal reflux disease)   . Hyperlipidemia   . Hypertension     Past Surgical History:  Procedure Laterality Date  . AMPUTATION Right 05/17/2020   Procedure: AMPUTATION BELOW RIGHT KNEE;  Surgeon: Newt Minion, MD;  Location: [place];  Service: Orthopedics;  Laterality: Right;  . AMPUTATION Right 06/19/2020   Procedure: RIGHT ABOVE KNEE AMPUTATION;  Surgeon: Newt Minion, MD;  Location: [place];  Service:  Orthopedics;  Laterality: Right;  . APPLICATION OF WOUND VAC Right 05/17/2020   Procedure: APPLICATION OF WOUND VAC;  Surgeon: Newt Minion, MD;  Location: [place];  Service: Orthopedics;  Laterality: Right;  . BIOPSY  03/13/2020   Procedure: BIOPSY;  Surgeon: Daneil Dolin, MD;  [facility];  Service: Endoscopy;;  gastric biopsy  . ESOPHAGOGASTRODUODENOSCOPY N/A 03/13/2020   Procedure: ESOPHAGOGASTRODUODENOSCOPY (EGD);  Surgeon: Daneil Dolin, MD;  [facility];  Service: Endoscopy;  Laterality: N/A;  . FLEXIBLE SIGMOIDOSCOPY N/A 05/06/2020   Procedure: FLEXIBLE SIGMOIDOSCOPY;  Surgeon: Daneil Dolin, MD;  [facility];  Service: Endoscopy;  Laterality: N/A;  . Thumb surgery      Family History  Problem Relation Age of Onset  . Cancer Father   . Diabetes Sister   . Stroke Brother   . Healthy Sister   . Healthy Sister   . Colon cancer Neg Hx   . Gastric cancer Neg Hx     Social History   Tobacco Use  . Smoking status: Current Every Day Smoker    Packs/day: 0.50    Years: 60.00    Pack years: 30.00    Types: Cigarettes  . Smokeless tobacco: Never Used  Vaping Use  . Vaping Use: Former  Substance Use Topics  . Alcohol use: No  Alcohol/week: 0.0 standard drinks  . Drug use: No    Medications: I have reviewed the patient's current medications. Current Facility-Administered Medications  Medication Dose Route Frequency Provider Last Rate Last Admin  . 0.9 %  sodium chloride infusion (Manually program via Guardrails IV Fluids)   Intravenous Once Kathie Dike, MD      . acetaminophen (TYLENOL) tablet 650 mg  650 mg Oral Q6H PRN Emokpae, Ejiroghene E, MD   650 mg at 07/19/2020 2303   Or  . acetaminophen (TYLENOL) suppository 650 mg  650 mg Rectal Q6H PRN Emokpae, Ejiroghene E, MD      . ceFEPIme (MAXIPIME) 2 g in sodium chloride 0.9 % 100 mL IVPB  2 g Intravenous Q8H Franky Macho, RPH 200 mL/hr at 07/21/2020 0523 2 g at 06/23/2020 0523  .  gabapentin (NEURONTIN) capsule 300 mg  300 mg Oral TID Emokpae, Ejiroghene E, MD   300 mg at 06/26/2020 0818  . insulin aspart (novoLOG) injection 0-9 Units  0-9 Units Subcutaneous Q4H Emokpae, Ejiroghene E, MD   1 Units at 07/06/2020 1205  . magnesium sulfate IVPB 2 g 50 mL  2 g Intravenous Once Kathie Dike, MD 50 mL/hr at 07/19/2020 1149 2 g at 07/02/2020 1149  . metroNIDAZOLE (FLAGYL) IVPB 500 mg  500 mg Intravenous Q8H Emokpae, Ejiroghene E, MD 100 mL/hr at 07/05/2020 0825 500 mg at 07/12/2020 0825  . ondansetron (ZOFRAN) tablet 4 mg  4 mg Oral Q6H PRN Emokpae, Ejiroghene E, MD       Or  . ondansetron (ZOFRAN) injection 4 mg  4 mg Intravenous Q6H PRN Emokpae, Ejiroghene E, MD      . polyethylene glycol (MIRALAX / GLYCOLAX) packet 17 g  17 g Oral Daily PRN Emokpae, Ejiroghene E, MD      . potassium chloride 10 mEq in 100 mL IVPB  10 mEq Intravenous Q1 Hr x 4 Memon, Jehanzeb, MD 100 mL/hr at 07/17/2020 1154 10 mEq at 07/02/2020 1154  . vancomycin (VANCOREADY) IVPB 750 mg/150 mL  750 mg Intravenous Q8H Franky Macho, RPH 150 mL/hr at 06/25/2020 0647 750 mg at 07/03/2020 7510    Allergies  Allergen Reactions  . Penicillins Other (See Comments)    Pt. States he "passed out"     ROS:  A comprehensive review of systems was negative except for: Genitourinary: positive for right inguinal region and scrotal tenderness  Musculoskeletal: positive for right buttock ulceration Neurological: positive for confusion  Blood pressure 139/68, pulse (!) 101, temperature 98.4 F (36.9 C), temperature source Oral, resp. rate 20, height 6\' 2"  (1.88 m), weight 78 kg, SpO2 94 %. Physical Exam Vitals reviewed.  Constitutional:      General: He is not in acute distress. HENT:     Head: Normocephalic.     Nose: Nose normal.     Mouth/Throat:     Mouth: Mucous membranes are moist.  Eyes:     Extraocular Movements: Extraocular movements intact.     Pupils: Pupils are equal, round, and reactive to light.  Cardiovascular:      Rate and Rhythm: Normal rate.  Pulmonary:     Effort: Pulmonary effort is normal.  Abdominal:     General: There is no distension.     Palpations: Abdomen is soft.     Tenderness: There is no abdominal tenderness.  Musculoskeletal:     Left lower leg: No edema.     Comments: Right AKA with dressing in place, right buttock/ ischial unstageable  decubitus ulcer, attempt at opening eschar at bedside but no tracking toward groin, crepitus into the right inguinal region, pain out of portion to exam  Skin:    General: Skin is warm.  Neurological:     Mental Status: He is alert. He is disoriented.     Comments: Oriented to self, city, 2021     Results: Results for orders placed or performed during the hospital encounter of 07/08/2020 (from the past 48 hour(s))  Urinalysis, Routine w reflex microscopic Urine, Clean Catch     Status: Abnormal   Collection Time: 07/02/2020  5:52 PM  Result Value Ref Range   Color, Urine YELLOW YELLOW   APPearance CLEAR CLEAR   Specific Gravity, Urine 1.027 1.005 - 1.030   pH 5.0 5.0 - 8.0   Glucose, UA >=500 (A) NEGATIVE mg/dL   Hgb urine dipstick MODERATE (A) NEGATIVE   Bilirubin Urine NEGATIVE NEGATIVE   Ketones, ur 20 (A) NEGATIVE mg/dL   Protein, ur 30 (A) NEGATIVE mg/dL   Nitrite NEGATIVE NEGATIVE   Leukocytes,Ua NEGATIVE NEGATIVE   RBC / HPF 0-5 0 - 5 RBC/hpf   WBC, UA 6-10 0 - 5 WBC/hpf   Bacteria, UA NONE SEEN NONE SEEN   Squamous Epithelial / LPF 0-5 0 - 5   Mucus PRESENT     Comment: Performed at [facility], [address], [place], [place] [address]  Comprehensive metabolic panel     Status: Abnormal   Collection Time: 07/13/2020  6:00 PM  Result Value Ref Range   Sodium 137 135 - 145 mmol/L   Potassium 2.6 (LL) 3.5 - 5.1 mmol/L    Comment: CRITICAL RESULT CALLED TO, READ BACK BY AND VERIFIED WITH: T DEVOLT AT 1845 ON 06/23/2020 BY MOSLEY,J    Chloride 97 (L) 98 - 111 mmol/L   CO2 26 22 - 32 mmol/L   Glucose, Bld 213 (H) 70 - 99 mg/dL     Comment: Glucose reference range applies only to samples taken after fasting for at least 8 hours.   BUN 17 8 - 23 mg/dL   Creatinine, Ser 0.62 0.61 - 1.24 mg/dL   Calcium 8.2 (L) 8.9 - 10.3 mg/dL   Total Protein 5.7 (L) 6.5 - 8.1 g/dL   Albumin 2.1 (L) 3.5 - 5.0 g/dL   AST 28 15 - 41 U/L   ALT 24 0 - 44 U/L   Alkaline Phosphatase 117 38 - 126 U/L   Total Bilirubin 0.9 0.3 - 1.2 mg/dL   GFR calc non Af Amer >60 >60 mL/min   GFR calc Af Amer >60 >60 mL/min   Anion gap 14 5 - 15    Comment: Performed at [facility], [address], [place], [place] [address]  CBC with Differential     Status: Abnormal   Collection Time: 07/16/2020  6:00 PM  Result Value Ref Range   WBC 31.5 (H) 4.0 - 10.5 K/uL   RBC 3.18 (L) 4.22 - 5.81 MIL/uL   Hemoglobin 7.3 (L) 13.0 - 17.0 g/dL   HCT 25.1 (L) 39 - 52 %   MCV 78.9 (L) 80.0 - 100.0 fL   MCH 23.0 (L) 26.0 - 34.0 pg   MCHC 29.1 (L) 30.0 - 36.0 g/dL   RDW 17.3 (H) 11.5 - 15.5 %   Platelets 425 (H) 150 - 400 K/uL   nRBC 0.0 0.0 - 0.2 %   Neutrophils Relative % 93 %   Neutro Abs 28.9 (H) 1.7 - 7.7 K/uL  Lymphocytes Relative 1 %   Lymphs Abs 0.4 (L) 0.7 - 4.0 K/uL   Monocytes Relative 2 %   Monocytes Absolute 0.8 0 - 1 K/uL   Eosinophils Relative 0 %   Eosinophils Absolute 0.0 0 - 0 K/uL   Basophils Relative 0 %   Basophils Absolute 0.1 0 - 0 K/uL   RBC Morphology TARGET CELLS     Comment: STOMATOCYTES   Immature Granulocytes 4 %   Abs Immature Granulocytes 1.31 (H) 0.00 - 0.07 K/uL   Target Cells PRESENT    Stomatocytes PRESENT     Comment: Performed at [facility], [address] [place], [place] [address]  Protime-INR     Status: Abnormal   Collection Time: 07/04/2020  6:00 PM  Result Value Ref Range   Prothrombin Time 15.2 11.4 - 15.2 seconds   INR 1.3 (H) 0.8 - 1.2    Comment: (NOTE) INR goal varies based on device and disease states. Performed at [facility], [address] [place], [place] [address]   APTT     Status: None    Collection Time: 06/30/2020  6:00 PM  Result Value Ref Range   aPTT 34 24 - 36 seconds    Comment: Performed at [facility], [address] [place], [place] [address]  Lactic acid, plasma     Status: None   Collection Time: 07/02/2020  6:02 PM  Result Value Ref Range   Lactic Acid, Venous 1.4 0.5 - 1.9 mmol/L    Comment: Performed at [facility], [address] [place], [place] [address]  Culture, blood (Routine x 2)     Status: None (Preliminary result)   Collection Time: 06/25/2020  6:03 PM   Specimen: Vein; Blood  Result Value Ref Range   Specimen Description BLOOD RIGHT HAND    Special Requests      BOTTLES DRAWN AEROBIC AND ANAEROBIC Blood Culture adequate volume Performed at [facility], [address] [place], [place] [address]    Culture PENDING    Report Status PENDING   Culture, blood (Routine x 2)     Status: None (Preliminary result)   Collection Time: 06/22/2020  6:03 PM   Specimen: Vein; Blood  Result Value Ref Range   Specimen Description BLOOD LEFT HAND    Special Requests      BOTTLES DRAWN AEROBIC AND ANAEROBIC Blood Culture adequate volume Performed at [place], [address], [name],  [address]    Culture PENDING    Report Status PENDING   SARS Coronavirus 2 by RT PCR (hospital order, performed in [place] hospital lab) Nasopharyngeal Nasopharyngeal Swab     Status: None   Collection Time: 07/02/2020  7:21 PM   Specimen: Nasopharyngeal Swab  Result Value Ref Range   SARS Coronavirus 2 NEGATIVE NEGATIVE    Comment: (NOTE) SARS-CoV-2 target nucleic acids are NOT DETECTED.  The SARS-CoV-2 RNA is generally detectable in upper and lower respiratory specimens during the acute phase of infection. The lowest concentration of SARS-CoV-2 viral copies this assay can detect is 250 copies / mL. A negative result does not preclude SARS-CoV-2 infection and should not be used as the sole basis for treatment or other patient management decisions.  A negative  result may occur with improper specimen collection / handling, submission of specimen other than nasopharyngeal swab, presence of viral mutation(s) within the areas targeted by this assay, and inadequate number of viral copies (<250 copies / mL). A negative result must be combined  with clinical observations, patient history, and epidemiological information.  Fact Sheet for Patients:   StrictlyIdeas.no  Fact Sheet for Healthcare Providers: BankingDealers.co.za  This test is not yet approved or  cleared by the [place] FDA and has been authorized for detection and/or diagnosis of SARS-CoV-2 by FDA under an Emergency Use Authorization (EUA).  This EUA will remain in effect (meaning this test can be used) for the duration of the COVID-19 declaration under Section 564(b)(1) of the Act, 21 U.S.C. section 360bbb-3(b)(1), unless the authorization is terminated or revoked sooner.  Performed at [facility], [address] [place], [place] [address]   Magnesium     Status: None   Collection Time: 07/02/2020  7:40 PM  Result Value Ref Range   Magnesium 1.7 1.7 - 2.4 mg/dL    Comment: Performed at [facility], [address], [place], [place] [address]  Lactic acid, plasma     Status: Abnormal   Collection Time: 07/16/2020  8:05 PM  Result Value Ref Range   Lactic Acid, Venous 2.1 (HH) 0.5 - 1.9 mmol/L    Comment: CRITICAL RESULT CALLED TO, READ BACK BY AND VERIFIED WITH: L ANDREWS AT 2041 ON 06/26/2020 BY MOSLEY,J Performed at [facility], [address] [place], [place] [address]   CBG monitoring, ED     Status: Abnormal   Collection Time: 07/07/2020  9:51 PM  Result Value Ref Range   Glucose-Capillary 232 (H) 70 - 99 mg/dL    Comment: Glucose reference range applies only to samples taken after fasting for at least 8 hours.  Lactic acid, plasma     Status: None   Collection Time: 07/08/2020 10:09 PM  Result Value Ref Range   Lactic Acid,  Venous 1.7 0.5 - 1.9 mmol/L    Comment: Performed at [facility], [address], [place], [place] [address]  CBG monitoring, ED     Status: Abnormal   Collection Time: 06/23/2020  3:05 AM  Result Value Ref Range   Glucose-Capillary 125 (H) 70 - 99 mg/dL    Comment: Glucose reference range applies only to samples taken after fasting for at least 8 hours.  Basic metabolic panel     Status: Abnormal   Collection Time: 07/21/2020  5:51 AM  Result Value Ref Range   Sodium 136 135 - 145 mmol/L   Potassium 2.9 (L) 3.5 - 5.1 mmol/L   Chloride 102 98 - 111 mmol/L   CO2 23 22 - 32 mmol/L   Glucose, Bld 127 (H) 70 - 99 mg/dL    Comment: Glucose reference range applies only to samples taken after fasting for at least 8 hours.   BUN 12 8 - 23 mg/dL   Creatinine, Ser 0.49 (L) 0.61 - 1.24 mg/dL   Calcium 7.8 (L) 8.9 - 10.3 mg/dL   GFR calc non Af Amer >60 >60 mL/min   GFR calc Af Amer >60 >60 mL/min   Anion gap 11 5 - 15    Comment: Performed at [facility], [address], [place],  [address]  CBC     Status: Abnormal   Collection Time: 06/23/2020  5:51 AM  Result Value Ref Range   WBC 32.1 (H) 4.0 - 10.5 K/uL   RBC 3.06 (L) 4.22 - 5.81 MIL/uL   Hemoglobin 7.0 (L) 13.0 - 17.0 g/dL   HCT 24.6 (L) 39 - 52 %   MCV 80.4 80.0 - 100.0 fL   MCH 22.9 (L) 26.0 - 34.0 pg   MCHC  28.5 (L) 30.0 - 36.0 g/dL   RDW 17.5 (H) 11.5 - 15.5 %   Platelets 394 150 - 400 K/uL   nRBC 0.0 0.0 - 0.2 %    Comment: Performed at [facility], [address] [place], [place] [address]  Glucose, capillary     Status: Abnormal   Collection Time: 06/29/2020  6:51 AM  Result Value Ref Range   Glucose-Capillary 147 (H) 70 - 99 mg/dL    Comment: Glucose reference range applies only to samples taken after fasting for at least 8 hours.  Glucose, capillary     Status: Abnormal   Collection Time: 07/17/2020  7:44 AM  Result Value Ref Range   Glucose-Capillary 170 (H) 70 - 99 mg/dL    Comment: Glucose reference range applies  only to samples taken after fasting for at least 8 hours.   Comment 1 Document in Chart   Type and screen [facility]     Status: None (Preliminary result)   Collection Time: 07/01/2020 10:10 AM  Result Value Ref Range   ABO/RH(D) A POS    Antibody Screen NEG    Sample Expiration      07/04/2020,2359 Performed at [facility], [address] [place], [place] [address]    Unit Number [id number]    Blood Component Type RED CELLS,LR    Unit division 00    Status of Unit ALLOCATED    Transfusion Status OK TO TRANSFUSE    Crossmatch Result Compatible   Prepare RBC (crossmatch)     Status: None   Collection Time: 06/21/2020 10:11 AM  Result Value Ref Range   Order Confirmation      ORDER PROCESSED BY BLOOD BANK Performed at [facility], [address], [place], [place] [address]   Glucose, capillary     Status: Abnormal   Collection Time: 06/24/2020 11:40 AM  Result Value Ref Range   Glucose-Capillary 129 (H) 70 - 99 mg/dL    Comment: Glucose reference range applies only to samples taken after fasting for at least 8 hours.    Personally reviewed - gas tracking from the decubitus ulcer toward the right inguinal region, no drainable collections noted  DG Sacrum/Coccyx  Result Date: 07/02/2020 CLINICAL DATA:  Decubitus ulcer.  Evaluate for osteomyelitis. EXAM: SACRUM AND COCCYX - 2+ VIEW COMPARISON:  None. FINDINGS: No acute bony abnormality identified. No radiographic evidence of osteomyelitis identified. Gas in the soft tissues is noted overlying the LOWER RIGHT pelvis on the frontal view. IMPRESSION: 1. No acute bony abnormality. No definite evidence of acute osteomyelitis. 2. Gas in the soft tissues overlying the LOWER RIGHT pelvis which may represent this patient's decubitus ulcer but correlate clinically. Electronically Signed   By: Margarette Canada M.D.   On: 07/02/2020 20:19   CT PELVIS W CONTRAST  Result Date: 07/14/2020 CLINICAL DATA:  Sacral ulcer EXAM: CT PELVIS WITH CONTRAST  TECHNIQUE: Multidetector CT imaging of the pelvis was performed using the standard protocol following the bolus administration of intravenous contrast. CONTRAST:  144mL OMNIPAQUE IOHEXOL 300 MG/ML  SOLN COMPARISON:  None. FINDINGS: Urinary Tract:  No abnormality visualized. Bowel:  Unremarkable visualized pelvic bowel loops. 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 Large amount of calcific atherosclerosis. Reproductive:  No mass or other significant abnormality Other: There is a right sacro-ischial decubitus ulcer with gas dissecting along the right perineal soft tissues into the right retroperitoneum and right inguinal canal. No fluid collection. Musculoskeletal: No suspicious bone  lesions identified. IMPRESSION: 1. Right sacro-ischial decubitus ulcer with gas dissecting along the right perineal soft tissues into the right retroperitoneum and right inguinal canal. No fluid collection. 2. 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 Recommend follow-up every 6 months and vascular consultation. This recommendation follows ACR consensus guidelines: White Paper of the ACR Incidental Findings Committee II on Vascular Findings. J Am Coll Radiol 2013; 91:478-295. 3. Aortic Atherosclerosis (ICD10-I70.0). Electronically Signed   By: Ulyses Jarred M.D.   On: 06/26/2020 23:22   DG Chest Port 1 View  Result Date: 07/06/2020 CLINICAL DATA:  Fever and sacral ulcer. EXAM: PORTABLE CHEST 1 VIEW COMPARISON:  The May 15, 2020 FINDINGS: There is no evidence of acute infiltrate, pleural effusion or pneumothorax. The heart size and mediastinal contours are within normal limits. There is mild calcification of the aortic arch and tortuosity of the descending thoracic aorta. The visualized skeletal structures are unremarkable. IMPRESSION: No active disease. Electronically Signed   By: Virgina Norfolk M.D.   On: 07/01/2020  18:37    Assessment & Plan:  TULLIO CHAUSSE is a 69 y.o. male with pain out of portion to exam, erythema in the inguinal region and crepitus. He does have a right buttock / ischial decubitus but given the additional findings on CT and examination I cannot rule out a necrotizing infection.  He is at risk for this given his diabetes.  He has a leukocytosis to 32. He needs operative debridement of the decubitus and counter incision to assess for a necrotizing infection in the inguinal region.    -His sister Abad Manard [phone] and she is his decision maker and closest family member. He has a wife with dementia in [facility] and children that he is estranged from and has not seen since they were babies.  He has requested for Omar Todd to be his decision maker.  -Spoke with Omar Todd and told her my concerns for possible necrotizing infection and need for debridement. Potential for serial debridement, wound vacuum placement, risk of bleeding, ICU care, transfer to [facility], and potential for large extensive wounds after serial debridements. Discussed that he would likely need a SNF if he were to survive this and has the potential to become septic and die from this pathology if a necrotizing infection is present. She understands and wants everything done at this time. She wants him to be a full code.   -Will need Broad spectrum antibiotics, Clindamycin for possible necrotizing infection, will ask Hospitalist to manage  -May need transfer after surgery given need for extensive debridement and ICU care that is not available at [facility] but unfortunately there are issues with bed availability and this may not be an option.   All questions were answered to the satisfaction of the Omar Todd, sister.  Virl Cagey 06/29/2020, 12:09 PM

## 2020-07-13 NOTE — Anesthesia Postprocedure Evaluation (Signed)
Anesthesia Post Note  Patient: Omar Todd  Procedure(s) Performed: SHARP EXCISION AND DEBRIDEMENT BUTTOCKS, inguinal region, SCROTUM (Right Buttocks) INCISION AND DRAINAGE right AKA stump (Right Leg Upper)  Patient location during evaluation: ICU Anesthesia Type: General Level of consciousness: sedated Pain management: pain level controlled Vital Signs Assessment: post-procedure vital signs reviewed and stable Respiratory status: patient on ventilator - see flowsheet for VS Cardiovascular status: blood pressure returned to baseline Anesthetic complications: no   No complications documented.   Last Vitals:  Vitals:   07/15/2020 1308 07/21/2020 1643  BP:    Pulse:    Resp:    Temp: 36.5 C 37.6 C  SpO2:  97%    Last Pain:  Vitals:   07/03/2020 1643  TempSrc: Axillary  PainSc:                  Louann Sjogren

## 2020-07-13 NOTE — Progress Notes (Signed)
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  Have discussed this with the sister already and need for debridement, serial OR, ICU care etc.    Will do best to do debridement at [facility] and hopefully get transferred down following for further debridements.   Curlene Labrum, MD [facility] [address] [place], [place] [address] [phone] (office)

## 2020-07-13 NOTE — ED Notes (Signed)
pts O2 fell to 88% on RA, pt placed on 2L per [place]

## 2020-07-13 NOTE — Anesthesia Procedure Notes (Signed)
Procedure Name: Intubation Date/Time: 06/21/2020 1:47 PM Performed by: Lyda Jester, CRNA Pre-anesthesia Checklist: Patient identified, Patient being monitored, Timeout performed, Emergency Drugs available and Suction available Patient Re-evaluated:Patient Re-evaluated prior to induction Oxygen Delivery Method: Circle System Utilized Preoxygenation: Pre-oxygenation with 100% oxygen Induction Type: IV induction Ventilation: Mask ventilation without difficulty Laryngoscope Size: Miller and 2 Grade View: Grade II Tube type: Oral Tube size: 7.0 mm Number of attempts: 1 Airway Equipment and Method: stylet Placement Confirmation: ETT inserted through vocal cords under direct vision,  positive ETCO2 and breath sounds checked- equal and bilateral Secured at: 21 cm Tube secured with: Tape Dental Injury: Teeth and Oropharynx as per pre-operative assessment

## 2020-07-13 NOTE — Progress Notes (Signed)
PROGRESS NOTE    Omar Todd  RDE:[id number] DOB: Feb 19, 1951 DOA: 07/14/2020 PCP: Omar Norlander, DO    Brief Narrative:  HPI: Omar Todd is a 69 y.o. male with medical history significant for COPD, right AKA, DM, HTN, bilateral carotid artery disease. Patient was sent to the ED by home health RN for pressure ulcer to sacrum and temperature of 102.1.  In the time of my evaluation patient initially appeared to be asleep, but to talk she arouses, becomes awake and alert, answers a few questions, asks for a bedside urinal. Patient tells me he has had 2 ulcer since he left the hospital.  No difficulty breathing, no cough, no chest pain, no abdominal pain, no vomiting, no loose stools, denies pain with urination.  Recent hospitalization 7/ 29-06/22/20-for right BKA stump infection with mixed flexion contracture of the hip.  Patient had above-knee amputation by Dr. Sharol Given 06/19/2020.  Physical therapy recommended skilled nursing facility, but patient declined and opted to go home with home health.  ED Course: 100.2, tachycardic to 108, tachypneic to 24, blood pressure systolic 631S to 970Y.  UA not suggestive of infection.  Potassium 2.6.  WBC 31.5.  Lactic acid 1.4 > 2.1. Chest x-ray without acute abnormality.  X-ray of the sacrum without acute bony abnormalities, no evidence of acute osteomyelitis, shows gas in the soft tissues overlying the lower right pelvis.  Patient was started on ciprofloxacin and clindamycin and vancomycin.  Hospitalist admit for further evaluation and management.   Assessment & Plan:   Principal Problem:   Sepsis ([place]) Active Problems:   Diabetic neuropathy, type II diabetes mellitus ([place])   BPH (benign prostatic hyperplasia)   Pressure ulcer of buttock   Right above-knee amputee ([place])   1. Severe sepsis due to infected pressure ulcer of buttock and concerns for underlying necrotizing fasciitis of the groin.  Patient presented with high WBC count,  tachycardia, low-grade fever and elevated lactic acid of 2.1.  He has been started on intravenous antibiotics.  CT of the pelvis shows subcutaneous gas tracking into the right perineal soft tissues.  Seen by general surgery.  Will need operative management.  Due to the high probability of recurrent procedures, he will likely need transfer to [facility] for definitive treatment.  Dr. Constance Haw will discuss with general surgery team at [facility]. 2. Anemia, likely related to chronic disease.  Baseline hemoglobin appears to run between 7-8.  Current hemoglobin is 7.  Will transfuse 1 unit PRBC in anticipation of surgery. 3. Hypokalemia.  Replace. 4. Recent right above-the-knee amputation on 7/30.  Followed by Dr. Sharol Given. 5. COPD.  Currently stable.  No wheezing.  Continue bronchodilators as needed. 6. Type 2 diabetes with neuropathy.  Continue on sliding scale insulin.  Chronically on Jardiance, Metformin and Januvia which are currently on hold.  Continue gabapentin for neuropathy. 7. Hypertension.  Holding hydrochlorothiazide and lisinopril.  He also takes amlodipine and metoprolol.  Holding antihypertensives for now in light of developing sepsis.   DVT prophylaxis: SCDs Start: 07/17/2020 2143  Code Status: full code Family Communication: discussed with sister Omar Todd over the phone Disposition Plan: Status is: Inpatient  Remains inpatient appropriate because:Inpatient level of care appropriate due to severity of illness   Dispo: The patient is from: Home              Anticipated d/c is to: TBD              Anticipated d/c date is: >  3 days              Patient currently is not medically stable to d/c. Patient will need surgical intervention to appropriately treat his infection. May need transfer to [facility] for further care.    Consultants:   Gen surgery  Procedures:     Antimicrobials:   Vancomycin 8/22>  Metronidazole 8/22>  Cefepime 8/22>    Subjective: Denies any pain at  this time, no shortness of breath, wants something to eat  Objective: Vitals:   07/17/2020 0200 07/17/2020 0230 06/26/2020 0308 07/19/2020 0342  BP: 133/71 (!) 150/58 (!) 139/59 139/68  Pulse: (!) 106 100 100 (!) 101  Resp: 19 13 (!) 26 20  Temp:    98.4 F (36.9 C)  TempSrc:    Oral  SpO2: 97% 95% 95% 94%  Weight:      Height:        Intake/Output Summary (Last 24 hours) at 07/18/2020 1004 Last data filed at 07/18/2020 0647 Gross per 24 hour  Intake 3850 ml  Output 1200 ml  Net 2650 ml   Filed Weights   06/25/2020 1748  Weight: 78 kg    Examination:  General exam: Appears calm and comfortable  Respiratory system: Clear to auscultation. Respiratory effort normal. Cardiovascular system: S1 & S2 heard, RRR. No JVD, murmurs, rubs, gallops or clicks. No pedal edema. Gastrointestinal system: Abdomen is nondistended, soft and nontender. No organomegaly or masses felt. Normal bowel sounds heard. GU: scrotum is red, tender and swollen Central nervous system: No focal neurological deficits. Extremities: right AKA Skin: large ulcer, foul smelling over right buttocks Psychiatry: mildly confused, able to follow commands    Data Reviewed: I have personally reviewed following labs and imaging studies  CBC: Recent Labs  Lab 07/03/2020 1800 06/29/2020 0551  WBC 31.5* 32.1*  NEUTROABS 28.9*  --   HGB 7.3* 7.0*  HCT 25.1* 24.6*  MCV 78.9* 80.4  PLT 425* 161   Basic Metabolic Panel: Recent Labs  Lab 07/17/2020 1800 07/15/2020 1940 07/06/2020 0551  NA 137  --  136  K 2.6*  --  2.9*  CL 97*  --  102  CO2 26  --  23  GLUCOSE 213*  --  127*  BUN 17  --  12  CREATININE 0.62  --  0.49*  CALCIUM 8.2*  --  7.8*  MG  --  1.7  --    GFR: Estimated Creatinine Clearance: 96.1 mL/min (A) (by C-G formula based on SCr of 0.49 mg/dL (L)). Liver Function Tests: Recent Labs  Lab 07/07/2020 1800  AST 28  ALT 24  ALKPHOS 117  BILITOT 0.9  PROT 5.7*  ALBUMIN 2.1*   No results for input(s):  LIPASE, AMYLASE in the last 168 hours. No results for input(s): AMMONIA in the last 168 hours. Coagulation Profile: Recent Labs  Lab 06/30/2020 1800  INR 1.3*   Cardiac Enzymes: No results for input(s): CKTOTAL, CKMB, CKMBINDEX, TROPONINI in the last 168 hours. BNP (last 3 results) No results for input(s): PROBNP in the last 8760 hours. HbA1C: No results for input(s): HGBA1C in the last 72 hours. CBG: Recent Labs  Lab 06/29/2020 2151 07/02/2020 0305 07/14/2020 0651 07/13/20 0744  GLUCAP 232* 125* 147* 170*   Lipid Profile: No results for input(s): CHOL, HDL, LDLCALC, TRIG, CHOLHDL, LDLDIRECT in the last 72 hours. Thyroid Function Tests: No results for input(s): TSH, T4TOTAL, FREET4, T3FREE, THYROIDAB in the last 72 hours. Anemia Panel: No results for input(s): VITAMINB12,  FOLATE, FERRITIN, TIBC, IRON, RETICCTPCT in the last 72 hours. Sepsis Labs: Recent Labs  Lab 07/15/2020 1802 07/21/2020 2005 06/26/2020 2209  LATICACIDVEN 1.4 2.1* 1.7    Recent Results (from the past 240 hour(s))  Culture, blood (Routine x 2)     Status: None (Preliminary result)   Collection Time: 06/26/2020  6:03 PM   Specimen: Vein; Blood  Result Value Ref Range Status   Specimen Description BLOOD RIGHT HAND  Final   Special Requests   Final    BOTTLES DRAWN AEROBIC AND ANAEROBIC Blood Culture adequate volume Performed at [facility], [address], [place], [place] [address]    Culture PENDING  Incomplete   Report Status PENDING  Incomplete  Culture, blood (Routine x 2)     Status: None (Preliminary result)   Collection Time: 07/10/2020  6:03 PM   Specimen: Vein; Blood  Result Value Ref Range Status   Specimen Description BLOOD LEFT HAND  Final   Special Requests   Final    BOTTLES DRAWN AEROBIC AND ANAEROBIC Blood Culture adequate volume Performed at [facility], [address], [place], [place] [address]    Culture PENDING  Incomplete   Report Status PENDING  Incomplete  SARS Coronavirus 2 by RT  PCR (hospital order, performed in [facility] hospital lab) Nasopharyngeal Nasopharyngeal Swab     Status: None   Collection Time: 06/21/2020  7:21 PM   Specimen: Nasopharyngeal Swab  Result Value Ref Range Status   SARS Coronavirus 2 NEGATIVE NEGATIVE Final    Comment: (NOTE) SARS-CoV-2 target nucleic acids are NOT DETECTED.  The SARS-CoV-2 RNA is generally detectable in upper and lower respiratory specimens during the acute phase of infection. The lowest concentration of SARS-CoV-2 viral copies this assay can detect is 250 copies / mL. A negative result does not preclude SARS-CoV-2 infection and should not be used as the sole basis for treatment or other patient management decisions.  A negative result may occur with improper specimen collection / handling, submission of specimen other than nasopharyngeal swab, presence of viral mutation(s) within the areas targeted by this assay, and inadequate number of viral copies (<250 copies / mL). A negative result must be combined with clinical observations, patient history, and epidemiological information.  Fact Sheet for Patients:   StrictlyIdeas.no  Fact Sheet for Healthcare Providers: BankingDealers.co.za  This test is not yet approved or  cleared by the [place] FDA and has been authorized for detection and/or diagnosis of SARS-CoV-2 by FDA under an Emergency Use Authorization (EUA).  This EUA will remain in effect (meaning this test can be used) for the duration of the COVID-19 declaration under Section 564(b)(1) of the Act, 21 U.S.C. section 360bbb-3(b)(1), unless the authorization is terminated or revoked sooner.  Performed at [facility], [address], [place], [place] [address]          Radiology Studies: DG Sacrum/Coccyx  Result Date: 06/24/2020 CLINICAL DATA:  Decubitus ulcer.  Evaluate for osteomyelitis. EXAM: SACRUM AND COCCYX - 2+ VIEW COMPARISON:  None. FINDINGS:  No acute bony abnormality identified. No radiographic evidence of osteomyelitis identified. Gas in the soft tissues is noted overlying the LOWER RIGHT pelvis on the frontal view. IMPRESSION: 1. No acute bony abnormality. No definite evidence of acute osteomyelitis. 2. Gas in the soft tissues overlying the LOWER RIGHT pelvis which may represent this patient's decubitus ulcer but correlate clinically. Electronically Signed   By: Margarette Canada M.D.   On: 06/21/2020 20:19   CT PELVIS  W CONTRAST  Result Date: 07/11/2020 CLINICAL DATA:  Sacral ulcer EXAM: CT PELVIS WITH CONTRAST TECHNIQUE: Multidetector CT imaging of the pelvis was performed using the standard protocol following the bolus administration of intravenous contrast. CONTRAST:  166mL OMNIPAQUE IOHEXOL 300 MG/ML  SOLN COMPARISON:  None. FINDINGS: Urinary Tract:  No abnormality visualized. Bowel:  Unremarkable visualized pelvic bowel loops. 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 Large amount of calcific atherosclerosis. Reproductive:  No mass or other significant abnormality Other: There is a right sacro-ischial decubitus ulcer with gas dissecting along the right perineal soft tissues into the right retroperitoneum and right inguinal canal. No fluid collection. Musculoskeletal: No suspicious bone lesions identified. IMPRESSION: 1. Right sacro-ischial decubitus ulcer with gas dissecting along the right perineal soft tissues into the right retroperitoneum and right inguinal canal. No fluid collection. 2. 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 Recommend follow-up every 6 months and vascular consultation. This recommendation follows ACR consensus guidelines: White Paper of the ACR Incidental Findings Committee II on Vascular Findings. J Am Coll Radiol 2013; 67:619-509. 3. Aortic Atherosclerosis (ICD10-I70.0). Electronically Signed   By: Ulyses Jarred M.D.   On: 07/07/2020 23:22   DG Chest Port 1 View  Result Date: 07/08/2020 CLINICAL DATA:  Fever and sacral ulcer. EXAM: PORTABLE CHEST 1 VIEW COMPARISON:  The May 15, 2020 FINDINGS: There is no evidence of acute infiltrate, pleural effusion or pneumothorax. The heart size and mediastinal contours are within normal limits. There is mild calcification of the aortic arch and tortuosity of the descending thoracic aorta. The visualized skeletal structures are unremarkable. IMPRESSION: No active disease. Electronically Signed   By: Virgina Norfolk M.D.   On: 07/13/2020 18:37        Scheduled Meds: . sodium chloride   Intravenous Once  . gabapentin  300 mg Oral TID  . insulin aspart  0-9 Units Subcutaneous Q4H   Continuous Infusions: . ceFEPime (MAXIPIME) IV 2 g (07/20/2020 0523)  . magnesium sulfate bolus IVPB    . metronidazole 500 mg (07/12/2020 0825)  . potassium chloride    . vancomycin 750 mg (07/12/2020 0647)     LOS: 1 day    Time spent: 16mins    Kathie Dike, MD [facility]   If 7PM-7AM, please contact night-coverage [email or web address]  07/11/2020, 10:04 AM

## 2020-07-13 NOTE — Op Note (Signed)
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 Constance Haw, MD   Assistants: No qualified resident was available    Anesthesia: General endotracheal   Anesthesiologist: Louann Sjogren, MD    Specimens:  Right inguinal/ scrotal necrotizing infection- tissue; Right ischial decubitus culture; Right inguinal culture; Right AKA stump culture    Estimated Blood Loss: Minimal   Blood Replacement: None    Complications: None   Wound Class: Dirty/ Infected    Operative Indications:  Mr. Funari is a 69 yo who was admitted to [facility] with concern for an infected right ischial ulcer but had a CT that demonstrated gas tracking into the right inguinal region. Surgery was notified of this the AM after the CT was completed the night prior.  The patient was examined and found to have crepitus in the inguinal region and erythema and concern for an necrotizing infection.  He was confused and consent was obtained from his sister, Gage Weant who is his next of kin and decision maker. We discussed the possibility of the necrotizing infection given the CT and exam findings.  There were no options for transfer to a larger facility due to lack of beds and resources at this time. We discussed the risk of serial debridements, worsening infection, ICU stay, intubation, need for extensive wound care, bleeding, and  reconstructive surgery.  We discussed need for possible transfer. She expressed understanding and wanted to proceed.   Findings: Dish water drainage from inguinal region, necrotic tissue at base of right ischial wound tracking up to the right inguinal region, crepitus in the right inguinal region with dishwater drainage and necrotic tissue extending into the right scrotum, thromboembolic evidence in small vessels in region   Procedure: The patient was taken to the operating room and placed supine. General endotracheal anesthesia was induced. Intravenous antibiotics were not administered due to cultures being obtained and him receiving prior antibiotics. His right AKA stump was noted to have drainage from it on the lateral aspect of the incision and medially as well.  The entire stump and right groin, scrotum and penis were prepped and draped in to the field.    The right ischial wound was examined and the wound edges were debrided with some bleeding laterally and inferior on the skin but necrotic tissue and skin in the superior/ medial portion. This tracked deep and toward the perineum. Cultures of this deep portion of the wound were obtained.  To ensure I was not going to take more tissue than needed, I made a counter incision over the crepitus in the right inguinal region, and the tissue was grey with dish water drainage. Cultures were obtained. From here it was clear that this was necrotizing infection, and using sharp excisional debridement with a scalpel, the skin, subcutaneous tissue and muscle in the right buttock, right inguinal, and right scrotal region were taken down to healthy bleeding tissue.  The right testicle was identified and protected.  The debridement measured in the right buttock / right  inguinal region 20cmX10cmX4cm and right scrotal skin and subcutaneous tissue measured about 15cmX8cmX2cm.    The wound bed was made hemostatic. I called Urology intraoperatively to discuss the case and  notify them of the need to take the scrotal skin. The shaft skin was intact and did not appear to need debridement.  Urology is aware and available to help with additional debridement. The perianal skin was only about 1.5cm in total after the debridement in the right perineal area and given this there was likely no way to get a wound vacuum to stay, so dakins dampened kerlix was placed in the wound. A foley was placed by sterile protocol for wound management issues.  The groin wound was covered with ABD and mesh panties.   The right AKA stump had drainage laterally and some medially. The staples and retention sutures were removed and the wound was probed revealing a large cavity laterally where the wound did not heal and a smaller cavity medially. These did not seem to connect. Cultures were obtained.  Saline dampened kerlix was placed and ABD pad.    Final inspection revealed acceptable hemostasis. All counts were correct at the end of the case. The patient was taken to the ICU intubated for pain control and serial debridements without complication.     Curlene Labrum, MD [facility] [address] [place], [place] [address] [phone] (office)

## 2020-07-13 NOTE — Anesthesia Preprocedure Evaluation (Signed)
Anesthesia Evaluation  Patient identified by MRN, date of birth, ID band Patient confused    Reviewed: Allergy & Precautions, H&P , NPO status , Patient's Chart, lab work & pertinent test results, reviewed documented beta blocker date and time , Unable to perform ROS - Chart review only  Airway Mallampati: II  TM Distance: >3 FB Neck ROM: full    Dental no notable dental hx. (+) Edentulous Upper, Edentulous Lower   Pulmonary COPD,  COPD inhaler, Current Smoker,    Pulmonary exam normal breath sounds clear to auscultation       Cardiovascular Exercise Tolerance: Good hypertension, + Peripheral Vascular Disease  + Valvular Problems/Murmurs  Rhythm:regular Rate:Normal     Neuro/Psych PSYCHIATRIC DISORDERS Dementia negative neurological ROS     GI/Hepatic Neg liver ROS, GERD  Medicated,  Endo/Other  negative endocrine ROSdiabetes  Renal/GU negative Renal ROS  negative genitourinary   Musculoskeletal   Abdominal   Peds  Hematology  (+) Blood dyscrasia, anemia ,   Anesthesia Other Findings   Reproductive/Obstetrics negative OB ROS                             Anesthesia Physical Anesthesia Plan  ASA: IV and emergent  Anesthesia Plan: General   Post-op Pain Management:    Induction:   PONV Risk Score and Plan:   Airway Management Planned:   Additional Equipment:   Intra-op Plan:   Post-operative Plan:   Informed Consent: I have reviewed the patients History and Physical, chart, labs and discussed the procedure including the risks, benefits and alternatives for the proposed anesthesia with the patient or authorized representative who has indicated his/her understanding and acceptance.     Dental Advisory Given  Plan Discussed with: CRNA  Anesthesia Plan Comments:         Anesthesia Quick Evaluation

## 2020-07-13 NOTE — Progress Notes (Addendum)
[facility]  Patient with Fournier's gangrene on operative debridement. Potentially started from the right ischial wound tracking and became super infected. Initial debridement completed. Right buttock, inguinal region, and right scrotal debridement performed. Notified Urology who was not immediately available, Dr. Lovena Neighbours and Dr. Alyson Ingles.    Given extent of the debridement will leave patient intubated for pain control and potential dressing changes. Was unable to do a wound vac due to proximity to perianal skin. Dakins dampened gauze to the debridement area.  Right AKA stump with purulent drainage, cultures obtained and wound opened laterally and medially at drainage parts.  Discussed patient with Dr. Roderic Palau, Margie Billet, [place] with [facility] pending transfer. If he does not transfer will have to plan for debridement again in 48-72 hrs.   Discussed with his sister Shann Merrick, [phone] cell, who is his decision maker.   Curlene Labrum, MD [facility] [address] [place], [place] [address] [phone] (office)

## 2020-07-13 NOTE — Transfer of Care (Signed)
Immediate Anesthesia Transfer of Care Note  Patient: Omar Todd  Procedure(s) Performed: SHARP EXCISION AND DEBRIDEMENT BUTTOCKS, inguinal region, SCROTUM (Right Buttocks) INCISION AND DRAINAGE right AKA stump (Right Leg Upper)  Patient Location: PACU and ICU  Anesthesia Type:General  Level of Consciousness: Patient remains intubated per anesthesia plan  Airway & Oxygen Therapy: Patient remains intubated per anesthesia plan and Patient placed on Ventilator (see vital sign flow sheet for setting)  Post-op Assessment: Report given to RN and Post -op Vital signs reviewed and stable  Post vital signs: Reviewed and stable  Last Vitals:  Vitals Value Taken Time  BP 177/79 07/12/2020 1700  Temp 37.6 C 06/26/2020 1643  Pulse 123 06/27/2020 1700  Resp 12 07/11/2020 1700  SpO2 93 % 07/04/2020 1700  Vitals shown include unvalidated device data.  Last Pain:  Vitals:   06/23/2020 1643  TempSrc: Axillary  PainSc:       Patients Stated Pain Goal: 5 (70/76/15 1834)  Complications: No complications documented.

## 2020-07-14 ENCOUNTER — Inpatient Hospital Stay: Payer: Self-pay

## 2020-07-14 ENCOUNTER — Inpatient Hospital Stay (HOSPITAL_COMMUNITY): Payer: Medicare Other

## 2020-07-14 ENCOUNTER — Other Ambulatory Visit: Payer: Self-pay

## 2020-07-14 DIAGNOSIS — J9601 Acute respiratory failure with hypoxia: Secondary | ICD-10-CM

## 2020-07-14 DIAGNOSIS — K7201 Acute and subacute hepatic failure with coma: Secondary | ICD-10-CM

## 2020-07-14 DIAGNOSIS — J9600 Acute respiratory failure, unspecified whether with hypoxia or hypercapnia: Secondary | ICD-10-CM

## 2020-07-14 DIAGNOSIS — N493 Fournier gangrene: Secondary | ICD-10-CM

## 2020-07-14 DIAGNOSIS — R6521 Severe sepsis with septic shock: Secondary | ICD-10-CM

## 2020-07-14 DIAGNOSIS — T8781 Dehiscence of amputation stump: Secondary | ICD-10-CM

## 2020-07-14 LAB — GLUCOSE, CAPILLARY
Glucose-Capillary: 102 mg/dL — ABNORMAL HIGH (ref 70–99)
Glucose-Capillary: 127 mg/dL — ABNORMAL HIGH (ref 70–99)
Glucose-Capillary: 130 mg/dL — ABNORMAL HIGH (ref 70–99)
Glucose-Capillary: 150 mg/dL — ABNORMAL HIGH (ref 70–99)
Glucose-Capillary: 159 mg/dL — ABNORMAL HIGH (ref 70–99)
Glucose-Capillary: 173 mg/dL — ABNORMAL HIGH (ref 70–99)
Glucose-Capillary: 95 mg/dL (ref 70–99)

## 2020-07-14 LAB — COMPREHENSIVE METABOLIC PANEL
ALT: 452 U/L — ABNORMAL HIGH (ref 0–44)
ALT: 359 U/L — ABNORMAL HIGH (ref 0–44)
AST: 1151 U/L — ABNORMAL HIGH (ref 15–41)
AST: 660 U/L — ABNORMAL HIGH (ref 15–41)
Albumin: 1.4 g/dL — ABNORMAL LOW (ref 3.5–5.0)
Albumin: 1.3 g/dL — ABNORMAL LOW (ref 3.5–5.0)
Alkaline Phosphatase: 95 U/L (ref 38–126)
Alkaline Phosphatase: 85 U/L (ref 38–126)
Anion gap: 9 (ref 5–15)
Anion gap: 6 (ref 5–15)
BUN: 23 mg/dL (ref 8–23)
BUN: 23 mg/dL (ref 8–23)
CO2: 22 mmol/L (ref 22–32)
CO2: 22 mmol/L (ref 22–32)
Calcium: 7 mg/dL — ABNORMAL LOW (ref 8.9–10.3)
Calcium: 6.6 mg/dL — ABNORMAL LOW (ref 8.9–10.3)
Chloride: 106 mmol/L (ref 98–111)
Chloride: 111 mmol/L (ref 98–111)
Creatinine, Ser: 0.61 mg/dL (ref 0.61–1.24)
Creatinine, Ser: 0.53 mg/dL — ABNORMAL LOW (ref 0.61–1.24)
GFR calc Af Amer: >60 mL/min (ref >60)
GFR calc Af Amer: >60 mL/min (ref >60)
GFR calc non Af Amer: >60 mL/min (ref >60)
GFR calc non Af Amer: >60 mL/min (ref >60)
Glucose, Bld: 139 mg/dL — ABNORMAL HIGH (ref 70–99)
Glucose, Bld: 96 mg/dL (ref 70–99)
Potassium: 3.5 mmol/L (ref 3.5–5.1)
Potassium: 3.2 mmol/L — ABNORMAL LOW (ref 3.5–5.1)
Sodium: 137 mmol/L (ref 135–145)
Sodium: 139 mmol/L (ref 135–145)
Total Bilirubin: 0.6 mg/dL (ref 0.3–1.2)
Total Bilirubin: 0.6 mg/dL (ref 0.3–1.2)
Total Protein: 4.3 g/dL — ABNORMAL LOW (ref 6.5–8.1)
Total Protein: 3.9 g/dL — ABNORMAL LOW (ref 6.5–8.1)

## 2020-07-14 LAB — BLOOD GAS, ARTERIAL
Acid-base deficit: 0.8 mmol/L (ref 0.0–2.0)
Bicarbonate: 23.4 mmol/L (ref 20.0–28.0)
O2 Saturation: 97.6 %
Patient temperature: 98.6
pCO2 arterial: 39.3 mmHg (ref 32.0–48.0)
pH, Arterial: 7.392 (ref 7.350–7.450)
pO2, Arterial: 96.8 mmHg (ref 83.0–108.0)

## 2020-07-14 LAB — TRIGLYCERIDES: Triglycerides: 64 mg/dL (ref <150)

## 2020-07-14 LAB — CBC
HCT: 24.3 % — ABNORMAL LOW (ref 39.0–52.0)
Hemoglobin: 6.7 g/dL — CL (ref 13.0–17.0)
MCH: 22.8 pg — ABNORMAL LOW (ref 26.0–34.0)
MCHC: 27.6 g/dL — ABNORMAL LOW (ref 30.0–36.0)
MCV: 82.7 fL (ref 80.0–100.0)
Platelets: 355 10*3/uL (ref 150–400)
RBC: 2.94 MIL/uL — ABNORMAL LOW (ref 4.22–5.81)
RDW: 17.8 % — ABNORMAL HIGH (ref 11.5–15.5)
WBC: 22.9 10*3/uL — ABNORMAL HIGH (ref 4.0–10.5)
nRBC: 0 % (ref 0.0–0.2)

## 2020-07-14 LAB — MRSA PCR SCREENING: MRSA by PCR: POSITIVE — AB

## 2020-07-14 LAB — PREPARE RBC (CROSSMATCH)

## 2020-07-14 IMAGING — DX DG CHEST 1V PORT
1 series · 1 of 1 positions shown · non-contrast
Comparison: Portable chest [DATE] and earlier.

CLINICAL DATA: 68-year-old male with respiratory failure. Decubitus
wound, sepsis.

EXAM:
PORTABLE CHEST 1 VIEW

[chest ap]
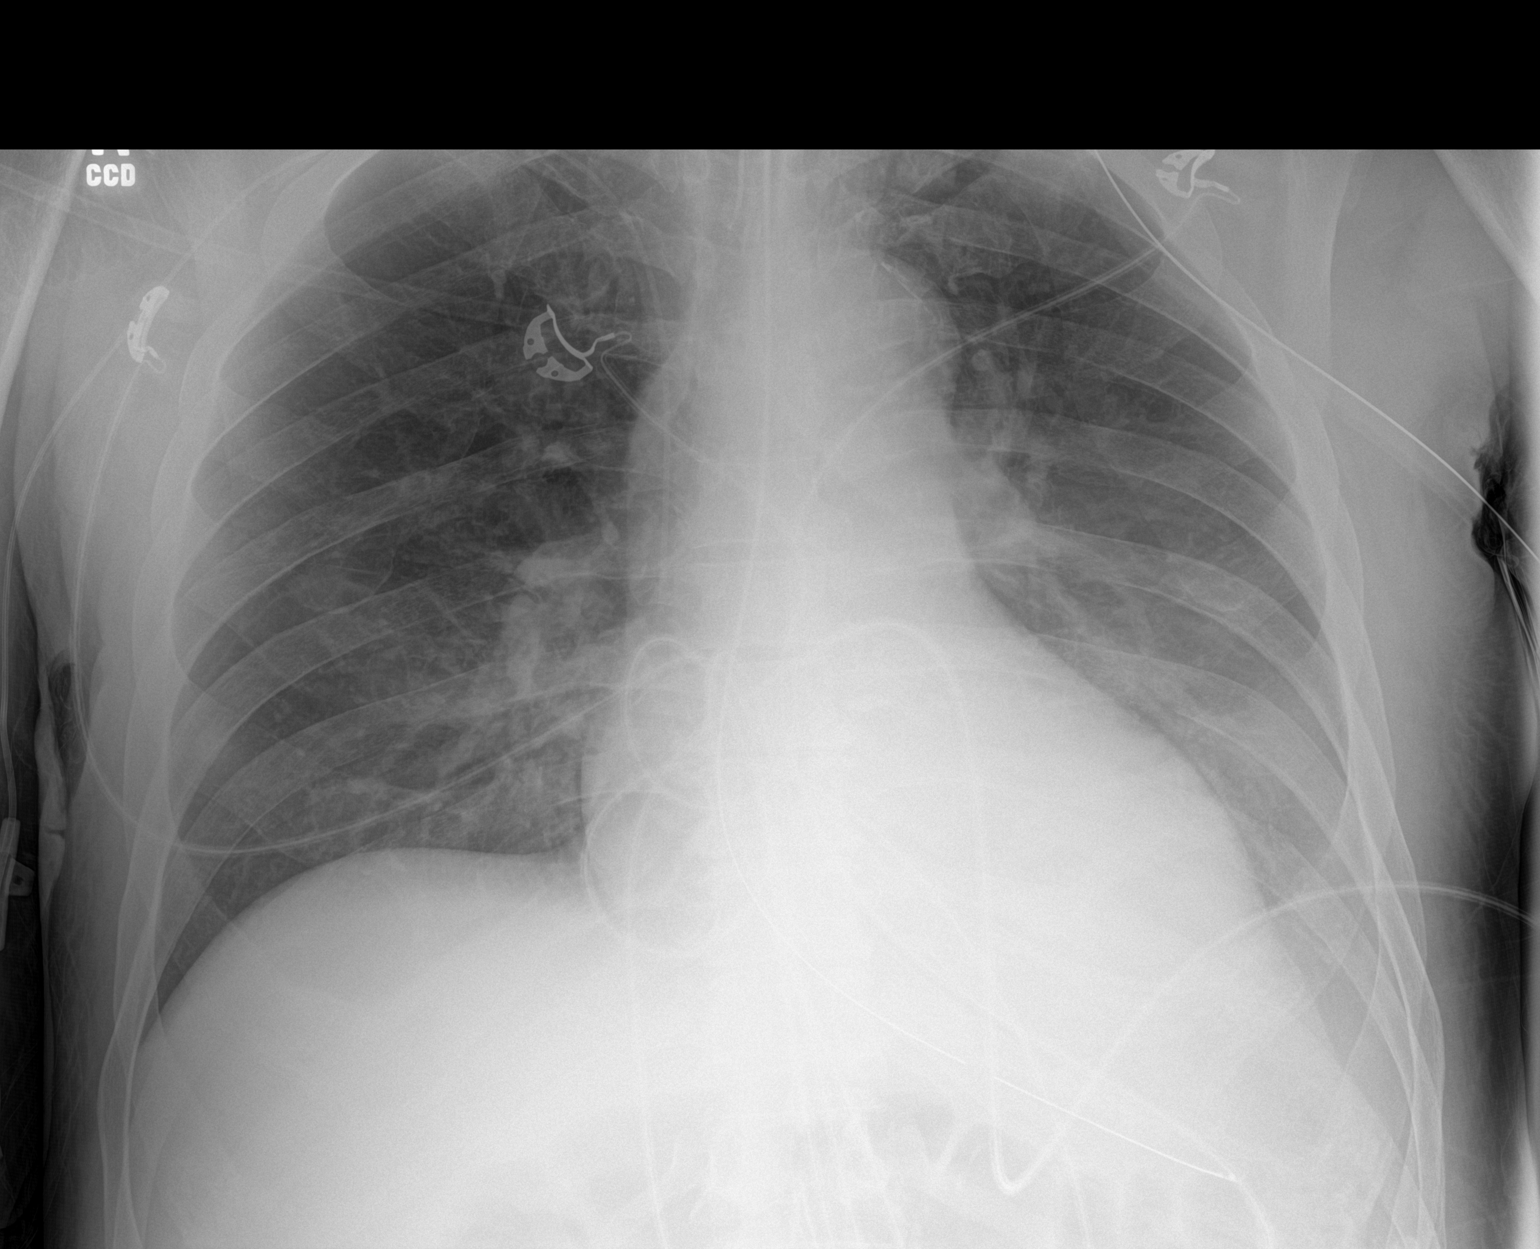

[1 of 1 positions shown; findings below may reference images not displayed]

FINDINGS: Portable AP semi upright view at [WH] hours. Endotracheal tube tip
in good position at the clavicles. Enteric tube courses to the
stomach, side hole the level of the gastric fundus. Right upper
extremity PICC line has been placed, tip is at the cavoatrial
junction level.

Mediastinal contours remain within normal limits. There is increased
confluent retrocardiac opacity on the left suggesting lower lobe
collapse or consolidation. No superimposed pneumothorax, pulmonary
edema, or definite effusion. Allowing for portable technique the
right lung remains clear.

Visible bowel-gas pattern within normal limits.
IMPRESSION: 1. New left lower lobe collapse or consolidation since yesterday.
2. Right upper extremity PICC line placed, tip at the cavoatrial
junction level.
3. Otherwise stable lines and tubes.

## 2020-07-14 IMAGING — US US ABDOMEN LIMITED
1 series · 14 of 25 positions shown · non-contrast
Comparison: None.

CLINICAL DATA: Elevated LFT

EXAM:
ULTRASOUND ABDOMEN LIMITED RIGHT UPPER QUADRANT

[Series 1: us abdomen limited · 14 of 35 slices shown]
[im 1/35]
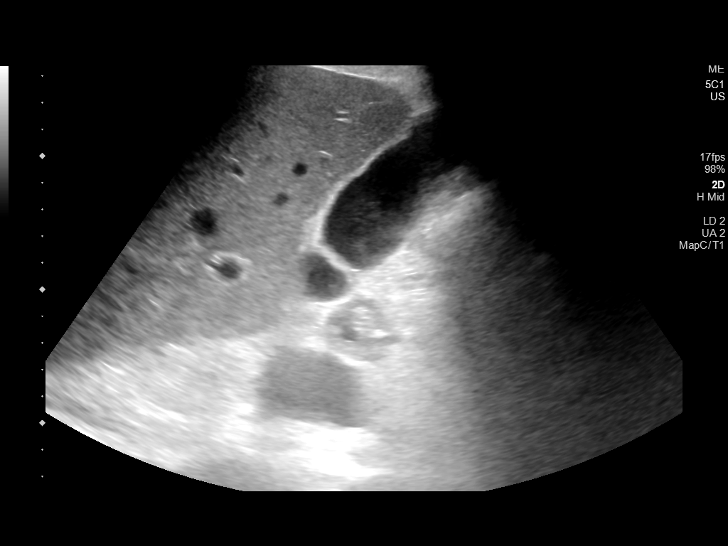
[im 3/35]
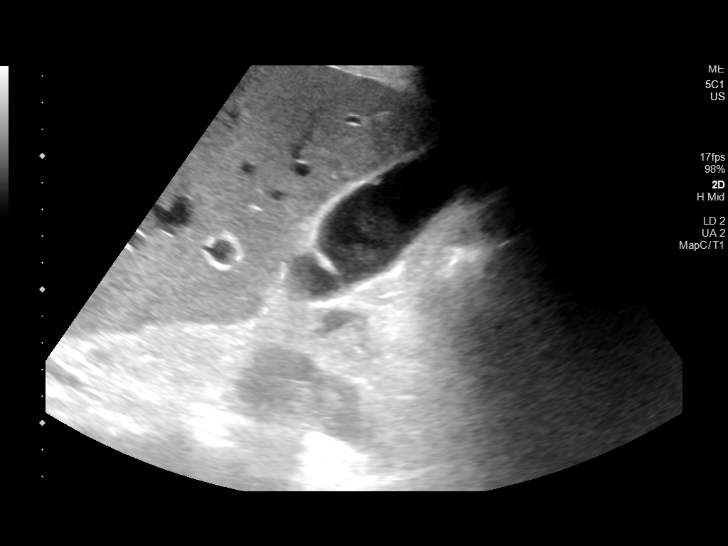
[im 6/35]
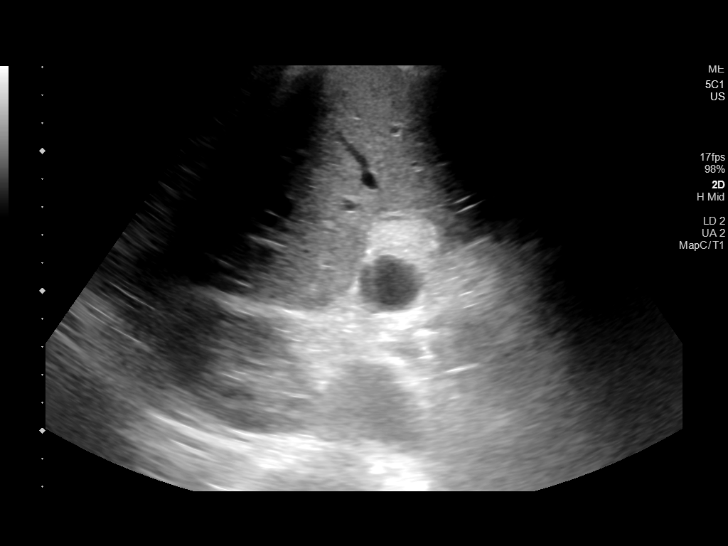
[im 9/35]
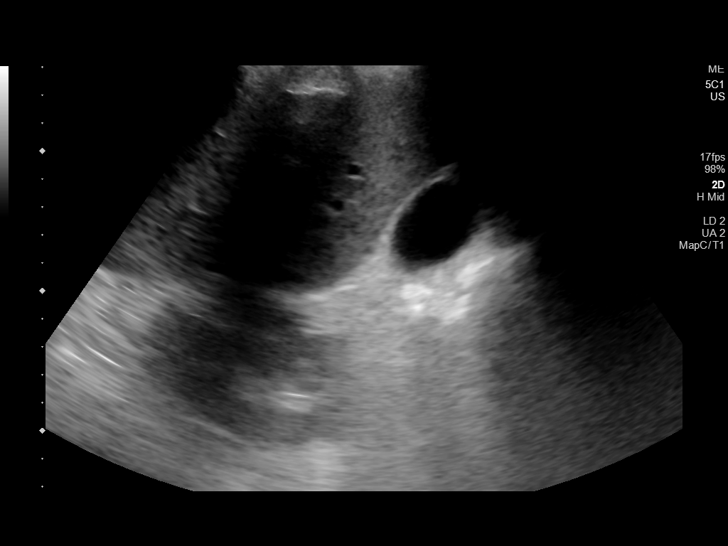
[im 12/35]
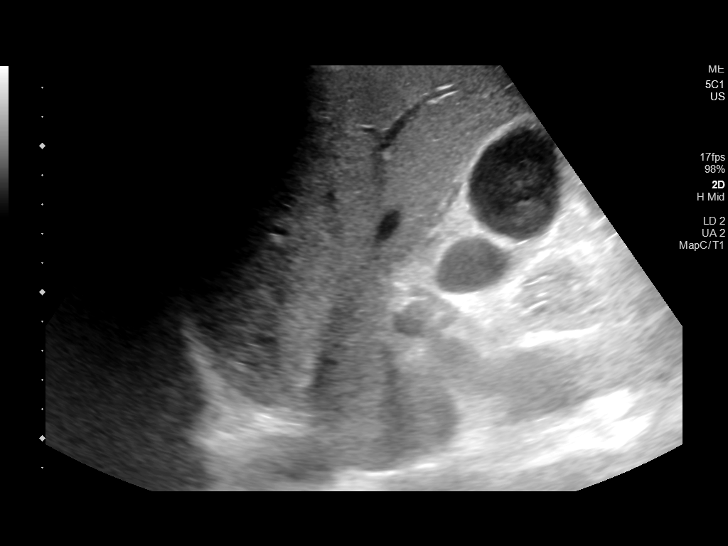
[im 13/35]
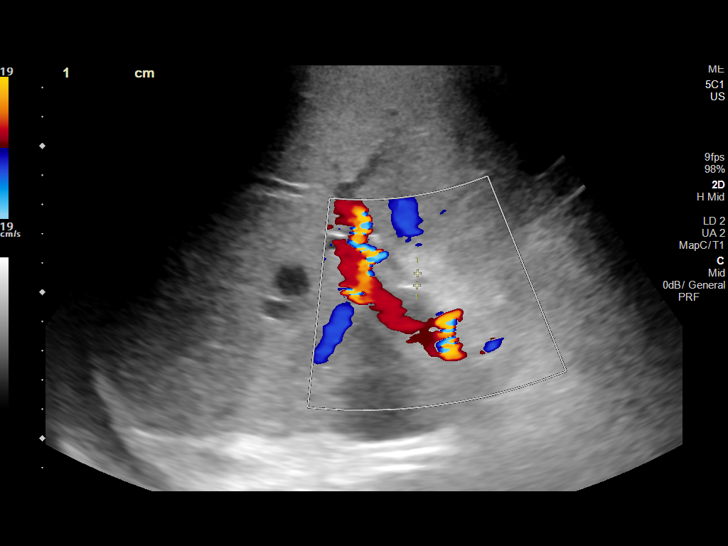
[im 16/35]
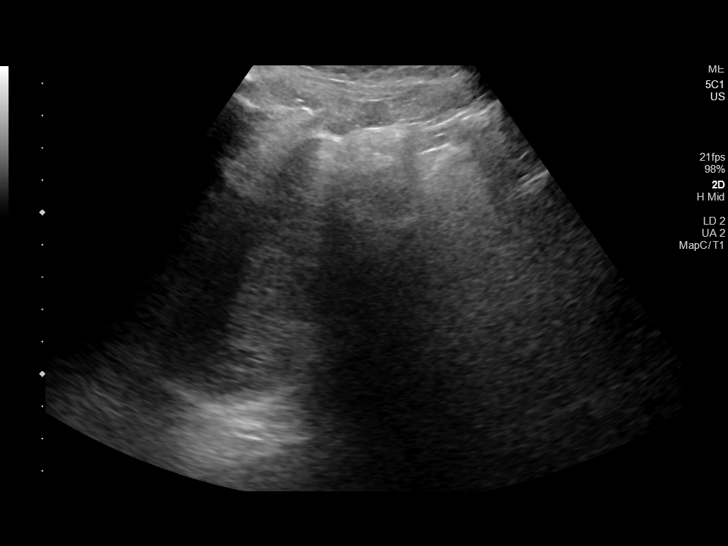
[im 19/35]
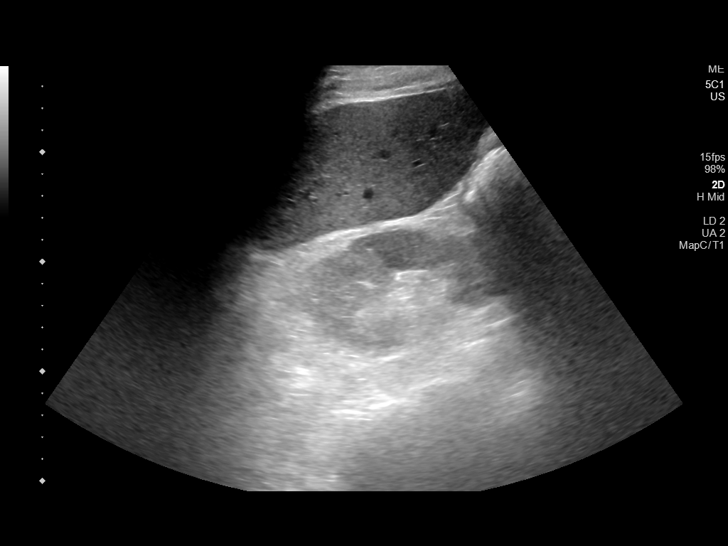
[im 22/35]
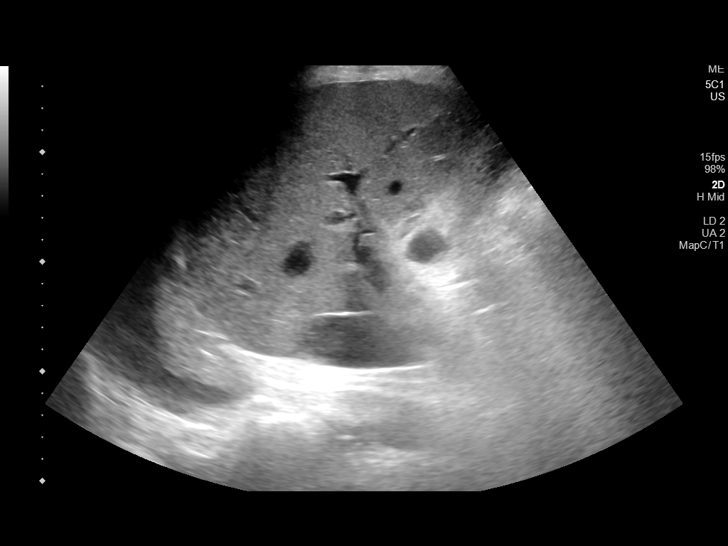
[im 23/35]
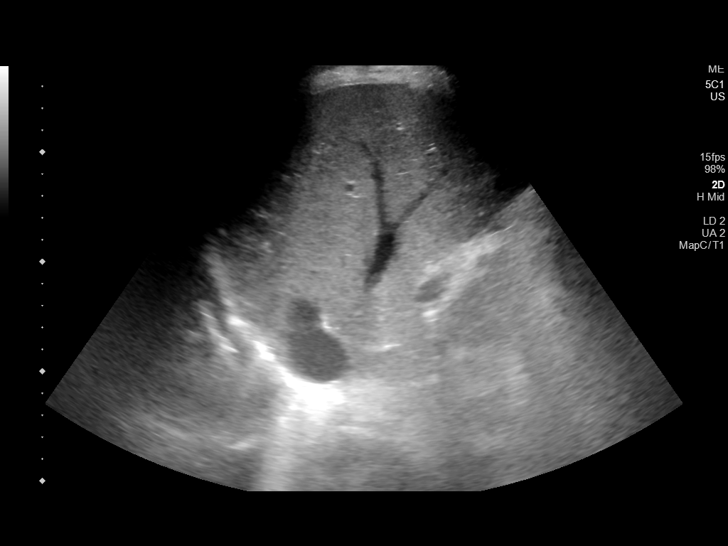
[im 26/35]
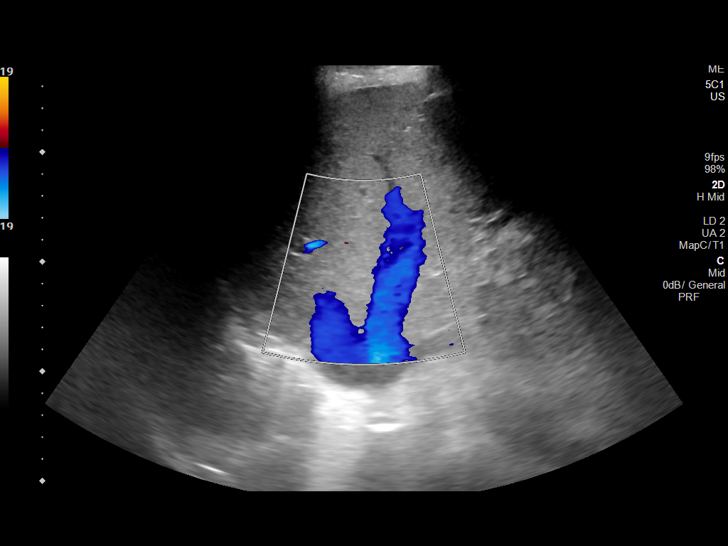
[im 29/35]
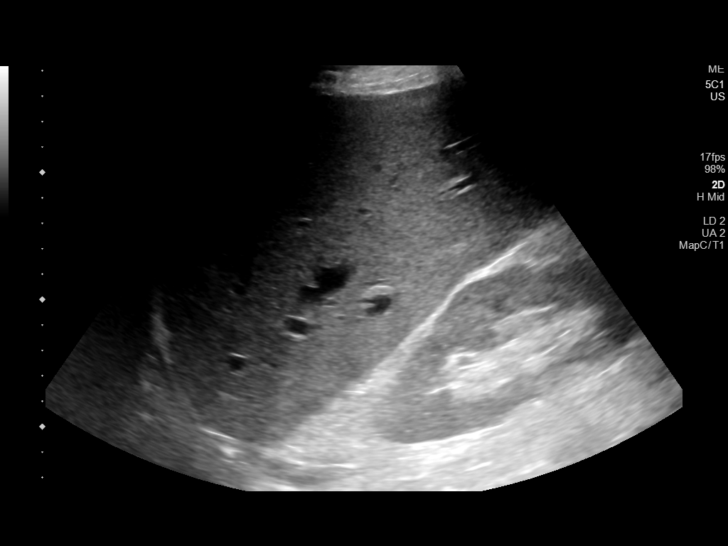
[im 32/35]
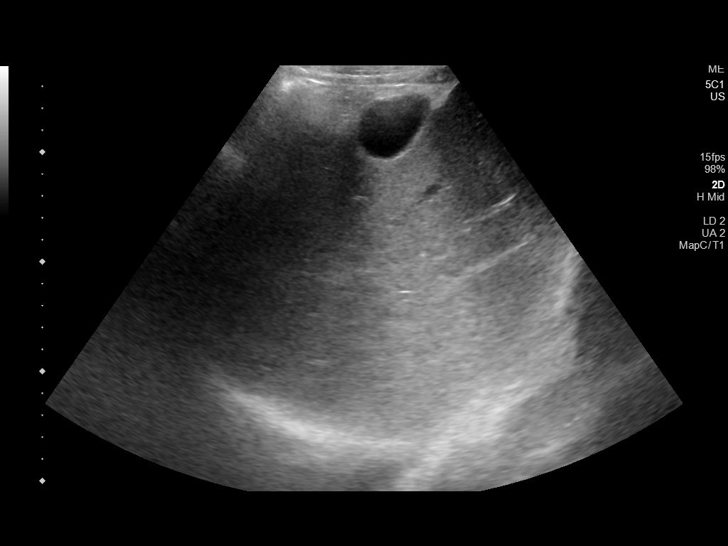
[im 35/35]
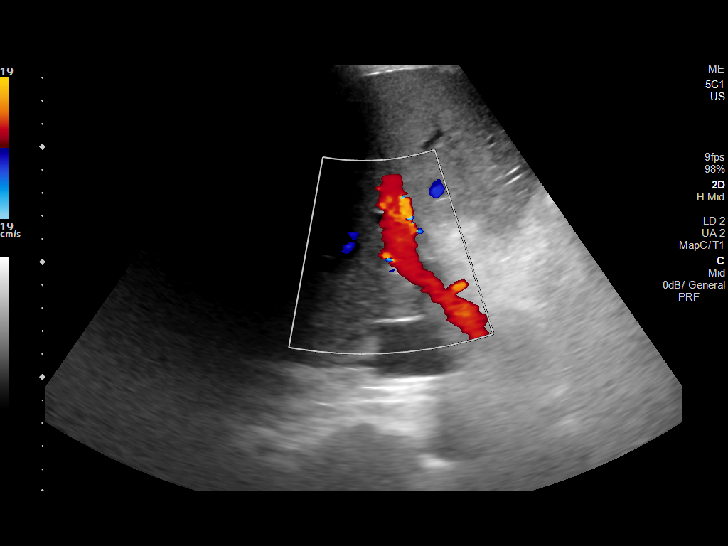

[14 of 25 positions shown; findings below may reference images not displayed]

FINDINGS: Gallbladder:

Negative for gallstones. Mild sludge in the gallbladder. Gallbladder
wall not significantly thickened. Patient is intubated and sedated
therefore Murphy sign not considered accurate.

Common bile duct:

Diameter: 4.1 mm

Liver:

Increased echogenicity liver. No focal liver lesion. Portal vein is
patent on color Doppler imaging with normal direction of blood flow
towards the liver.

Other: Limited study due to intubation.
IMPRESSION: Increased echogenicity liver compatible with fatty infiltration. No
liver mass.

Mild gallbladder sludge.  No biliary dilatation.

## 2020-07-14 MED ORDER — POTASSIUM CHLORIDE 20 MEQ/15ML (10%) PO SOLN
20.0000 meq | ORAL | Status: AC
  Administered 2020-07-14 – 2020-07-15 (×2): 20 meq
  Filled 2020-07-14 (×2): qty 15

## 2020-07-14 MED ORDER — SODIUM CHLORIDE 0.9 % IV SOLN: Freq: Once | INTRAVENOUS | Status: AC

## 2020-07-14 MED ORDER — PHENYLEPHRINE HCL-NACL 10-0.9 MG/250ML-% IV SOLN
INTRAVENOUS | Status: AC
  Filled 2020-07-14: qty 250

## 2020-07-14 MED ORDER — SODIUM CHLORIDE 0.9% FLUSH
10.0000 mL | Freq: Two times a day (BID) | INTRAVENOUS | Status: DC
  Administered 2020-07-14 – 2020-07-15 (×3): 10 mL
  Administered 2020-07-15: 20 mL
  Administered 2020-07-16 – 2020-07-17 (×2): 10 mL
  Administered 2020-07-17: 20 mL
  Administered 2020-07-18 – 2020-07-19 (×4): 10 mL
  Administered 2020-07-20: 20 mL
  Administered 2020-07-20 – 2020-07-21 (×2): 10 mL
  Administered 2020-07-21: 20 mL
  Administered 2020-07-22 – 2020-07-25 (×7): 10 mL
  Administered 2020-07-25 – 2020-07-26 (×2): 20 mL
  Administered 2020-07-26: 10 mL

## 2020-07-14 MED ORDER — SODIUM CHLORIDE 0.9% FLUSH: 10.0000 mL | INTRAVENOUS | Status: DC | PRN

## 2020-07-14 MED ORDER — POTASSIUM CHLORIDE 10 MEQ/50ML IV SOLN
10.0000 meq | INTRAVENOUS | Status: AC
  Administered 2020-07-14 – 2020-07-15 (×4): 10 meq via INTRAVENOUS
  Filled 2020-07-14 (×5): qty 50

## 2020-07-14 MED ORDER — SODIUM CHLORIDE 0.9 % IV SOLN
0.0000 ug/min | INTRAVENOUS | Status: DC
  Administered 2020-07-14: 90 ug/min via INTRAVENOUS
  Administered 2020-07-14: 100 ug/min via INTRAVENOUS
  Administered 2020-07-14: 130 ug/min via INTRAVENOUS
  Filled 2020-07-14 (×8): qty 2

## 2020-07-14 MED ORDER — SODIUM CHLORIDE 0.9% IV SOLUTION: Freq: Once | INTRAVENOUS | Status: AC

## 2020-07-14 MED ORDER — MUPIROCIN 2 % EX OINT
1.0000 "application " | TOPICAL_OINTMENT | Freq: Two times a day (BID) | CUTANEOUS | Status: AC
  Administered 2020-07-14 – 2020-07-19 (×10): 1 via NASAL
  Filled 2020-07-14 (×2): qty 22

## 2020-07-14 MED ORDER — PHENYLEPHRINE HCL-NACL 10-0.9 MG/250ML-% IV SOLN: 100.0000 ug/min | INTRAVENOUS | Status: DC

## 2020-07-14 MED ORDER — PHENYLEPHRINE HCL-NACL 10-0.9 MG/250ML-% IV SOLN
0.0000 ug/min | INTRAVENOUS | Status: DC
  Filled 2020-07-14: qty 250

## 2020-07-14 MED ORDER — PHENYLEPHRINE CONCENTRATED 100MG/250ML (0.4 MG/ML) INFUSION SIMPLE
0.0000 ug/min | INTRAVENOUS | Status: DC
  Administered 2020-07-14: 130 ug/min via INTRAVENOUS
  Administered 2020-07-15: 60 ug/min via INTRAVENOUS
  Administered 2020-07-17: 35 ug/min via INTRAVENOUS
  Filled 2020-07-14 (×3): qty 250

## 2020-07-14 NOTE — Progress Notes (Signed)
[facility] ADULT ICU REPLACEMENT PROTOCOL   The patient does apply for the [facility] Adult ICU Electrolyte Replacment Protocol based on the criteria listed below:   1. Is GFR >/= 30 ml/min? Yes.    Patient's GFR today is >60 2. Is SCr </= 2? Yes.   Patient's SCr is 0.53 ml/kg/hr 3. Did SCr increase >/= 0.5 in 24 hours? No. 4. Abnormal electrolyte(s): k 3.2 5. Ordered repletion with: protocol 6. If a panic level lab has been reported, has the CCM MD in charge been notified? No..   Physician:    Ronda Fairly A 07/14/2020 7:55 PM

## 2020-07-14 NOTE — Progress Notes (Addendum)
CRITICAL VALUE ALERT  Critical Value:  Hgb 6.7  Date & Time Notied:  07/14/20 0903  Provider Notified: MD Memon  Orders Received/Actions taken: Orders obtained to give 1 unit of PRBC

## 2020-07-14 NOTE — Consult Note (Signed)
Urology Consult  Referring physician: Dr. 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 He was examined by Dr. 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 He was taken to the operating room yesterday for excision of his inguinal and perineal soft tissue and was found to have involvement of his right hemiscrotum. The right hemiscrotum was excised exposing his right testis. Dakins on kerlix was applied to the wound. This morning he remains intubated. WBC count 22.9, hgb 6.7. He is currently on broad spectrum antibiotics. Patient is off presssors.  Past Medical History:  Diagnosis Date  . COPD (chronic obstructive pulmonary disease) ([place])   . Diabetes mellitus without complication ([place])   . Gangrene of toe of right foot ([place])   . GERD (gastroesophageal reflux disease)   . Hyperlipidemia   . Hypertension    Past Surgical History:  Procedure Laterality Date  . AMPUTATION Right 05/17/2020   Procedure: AMPUTATION BELOW RIGHT KNEE;  Surgeon: Newt Minion, MD;  Location: [facility];  Service: Orthopedics;  Laterality: Right;  . AMPUTATION Right 06/19/2020   Procedure: RIGHT ABOVE KNEE AMPUTATION;  Surgeon: Newt Minion, MD;  Location: [facility];  Service: Orthopedics;  Laterality: Right;  . APPLICATION OF WOUND VAC Right 05/17/2020   Procedure: APPLICATION OF WOUND VAC;  Surgeon: Newt Minion, MD;  Location: [facility];  Service: Orthopedics;  Laterality: Right;  . BIOPSY  03/13/2020   Procedure: BIOPSY;  Surgeon: Daneil Dolin, MD;  [facility];  Service: Endoscopy;;  gastric biopsy  . ESOPHAGOGASTRODUODENOSCOPY N/A 03/13/2020   Procedure: ESOPHAGOGASTRODUODENOSCOPY (EGD);  Surgeon: Daneil Dolin, MD;  [facility];  Service: Endoscopy;   Laterality: N/A;  . FLEXIBLE SIGMOIDOSCOPY N/A 05/06/2020   Procedure: FLEXIBLE SIGMOIDOSCOPY;  Surgeon: Daneil Dolin, MD;  [facility];  Service: Endoscopy;  Laterality: N/A;  . Thumb surgery      Medications: I have reviewed the patient's current medications. Allergies:  Allergies  Allergen Reactions  . Penicillins Other (See Comments)    Pt. States he "passed out"    Family History  Problem Relation Age of Onset  . Cancer Father   . Diabetes Sister   . Stroke Brother   . Healthy Sister   . Healthy Sister   . Colon cancer Neg Hx   . Gastric cancer Neg Hx    Social History:  reports that he has been smoking cigarettes. He has a 30.00 pack-year smoking history. He has never used smokeless tobacco. He reports that he does not drink alcohol and does not use drugs.  Review of Systems  Unable to perform ROS: Intubated    Physical Exam:  Vital signs in last 24 hours: Temp:  [97.7 F (36.5 C)-99.7 F (37.6 C)] 98.1 F (36.7 C) (08/24 1111) Pulse Rate:  [55-124] 60 (08/24 1115) Resp:  [0-25] 15 (08/24 1115) BP: (68-177)/(37-81) 93/41 (08/24 1115) SpO2:  [93 %-100 %] 100 % (08/24 1115) FiO2 (%):  [40 %-50 %] 40 % (08/24 1111) Weight:  [75.4 kg] 75.4 kg (08/23 1643) Physical Exam Constitutional:      General: He is not in acute distress. HENT:     Head: Normocephalic and atraumatic.     Nose: Nose normal.  Eyes:     Conjunctiva/sclera: Conjunctivae normal.  Cardiovascular:     Rate and Rhythm: Normal rate and regular rhythm.  Abdominal:     General: Abdomen is flat. There is no distension.  Genitourinary:    Penis: Normal and circumcised.      Testes:        Right: Tenderness present.        Left: Tenderness, testicular hydrocele or varicocele not present.     Epididymis:     Left: Normal.     Comments: Right testis exposed and appears viable. 2cm area of the posterior medial scrotal skin appears dusky.  Musculoskeletal:        General: No swelling.      Cervical back: Normal range of motion and neck supple.  Skin:    General: Skin is warm and dry.     Laboratory Data:  Results for orders placed or performed during the hospital encounter of 06/27/2020 (from the past 72 hour(s))  Urinalysis, Routine w reflex microscopic Urine, Clean Catch     Status: Abnormal   Collection Time: 06/23/2020  5:52 PM  Result Value Ref Range   Color, Urine YELLOW YELLOW   APPearance CLEAR CLEAR   Specific Gravity, Urine 1.027 1.005 - 1.030   pH 5.0 5.0 - 8.0   Glucose, UA >=500 (A) NEGATIVE mg/dL   Hgb urine dipstick MODERATE (A) NEGATIVE   Bilirubin Urine NEGATIVE NEGATIVE   Ketones, ur 20 (A) NEGATIVE mg/dL   Protein, ur 30 (A) NEGATIVE mg/dL   Nitrite NEGATIVE NEGATIVE   Leukocytes,Ua NEGATIVE NEGATIVE   RBC / HPF 0-5 0 - 5 RBC/hpf   WBC, UA 6-10 0 - 5 WBC/hpf   Bacteria, UA NONE SEEN NONE SEEN   Squamous Epithelial / LPF 0-5 0 - 5   Mucus PRESENT     Comment: Performed at [facility], [address] [place], [place] [address]  Urine culture     Status: Abnormal   Collection Time: 07/21/2020  5:57 PM   Specimen: In/Out Cath Urine  Result Value Ref Range   Specimen Description      IN/OUT CATH URINE Performed at [facility], [address], [place], [place] [address]    Special Requests      NONE Performed at [facility], [address], [place], [place] [address]    Culture MULTIPLE SPECIES PRESENT, SUGGEST RECOLLECTION (A)    Report Status 06/23/2020 FINAL   Comprehensive metabolic panel     Status: Abnormal   Collection Time: 07/09/2020  6:00 PM  Result Value Ref Range   Sodium 137 135 - 145 mmol/L   Potassium 2.6 (LL) 3.5 - 5.1 mmol/L    Comment: CRITICAL RESULT CALLED TO, READ BACK BY AND VERIFIED WITH: T DEVOLT AT 1845 ON 06/25/2020 BY MOSLEY,J    Chloride 97 (L) 98 - 111 mmol/L   CO2 26 22 - 32 mmol/L   Glucose, Bld 213 (H) 70 - 99 mg/dL    Comment: Glucose reference range applies only to samples taken after fasting for at least  8 hours.   BUN 17 8 - 23 mg/dL   Creatinine, Ser 0.62 0.61 - 1.24 mg/dL   Calcium 8.2 (L) 8.9 - 10.3 mg/dL   Total Protein 5.7 (L) 6.5 - 8.1 g/dL   Albumin 2.1 (L) 3.5 - 5.0 g/dL   AST 28 15 - 41 U/L   ALT 24 0 - 44 U/L   Alkaline Phosphatase 117 38 - 126 U/L  Total Bilirubin 0.9 0.3 - 1.2 mg/dL   GFR calc non Af Amer >60 >60 mL/min   GFR calc Af Amer >60 >60 mL/min   Anion gap 14 5 - 15    Comment: Performed at [facility], [address] [place], [place] [address]  CBC with Differential     Status: Abnormal   Collection Time: 06/30/2020  6:00 PM  Result Value Ref Range   WBC 31.5 (H) 4.0 - 10.5 K/uL   RBC 3.18 (L) 4.22 - 5.81 MIL/uL   Hemoglobin 7.3 (L) 13.0 - 17.0 g/dL   HCT 25.1 (L) 39 - 52 %   MCV 78.9 (L) 80.0 - 100.0 fL   MCH 23.0 (L) 26.0 - 34.0 pg   MCHC 29.1 (L) 30.0 - 36.0 g/dL   RDW 17.3 (H) 11.5 - 15.5 %   Platelets 425 (H) 150 - 400 K/uL   nRBC 0.0 0.0 - 0.2 %   Neutrophils Relative % 93 %   Neutro Abs 28.9 (H) 1.7 - 7.7 K/uL   Lymphocytes Relative 1 %   Lymphs Abs 0.4 (L) 0.7 - 4.0 K/uL   Monocytes Relative 2 %   Monocytes Absolute 0.8 0 - 1 K/uL   Eosinophils Relative 0 %   Eosinophils Absolute 0.0 0 - 0 K/uL   Basophils Relative 0 %   Basophils Absolute 0.1 0 - 0 K/uL   RBC Morphology TARGET CELLS     Comment: STOMATOCYTES   Immature Granulocytes 4 %   Abs Immature Granulocytes 1.31 (H) 0.00 - 0.07 K/uL   Target Cells PRESENT    Stomatocytes PRESENT     Comment: Performed at [facility], [address], [place], [place] [address]  Protime-INR     Status: Abnormal   Collection Time: 07/14/2020  6:00 PM  Result Value Ref Range   Prothrombin Time 15.2 11.4 - 15.2 seconds   INR 1.3 (H) 0.8 - 1.2    Comment: (NOTE) INR goal varies based on device and disease states. Performed at [facility], [address], [place], [place] [address]   APTT     Status: None   Collection Time: 07/19/2020  6:00 PM  Result Value Ref Range   aPTT 34 24 - 36 seconds     Comment: Performed at [facility], [address] [place], [place] [address]  Lactic acid, plasma     Status: None   Collection Time: 07/10/2020  6:02 PM  Result Value Ref Range   Lactic Acid, Venous 1.4 0.5 - 1.9 mmol/L    Comment: Performed at [facility], [address] [place], [place] [address]  Culture, blood (Routine x 2)     Status: None (Preliminary result)   Collection Time: 07/18/2020  6:03 PM   Specimen: Vein; Blood  Result Value Ref Range   Specimen Description BLOOD RIGHT HAND    Special Requests      BOTTLES DRAWN AEROBIC AND ANAEROBIC Blood Culture adequate volume Performed at [facility], [address] [place], [place] [address]    Culture PENDING    Report Status PENDING   Culture, blood (Routine x 2)     Status: None (Preliminary result)   Collection Time: 07/19/2020  6:03 PM   Specimen: Vein; Blood  Result Value Ref Range   Specimen Description BLOOD LEFT HAND    Special Requests      BOTTLES DRAWN AEROBIC AND ANAEROBIC Blood Culture adequate volume Performed at [facility], [address] [place], [place]  [address]    Culture PENDING    Report Status PENDING   SARS Coronavirus 2 by RT PCR (hospital order, performed in [facility] hospital lab) Nasopharyngeal Nasopharyngeal Swab     Status: None   Collection Time: 07/05/2020  7:21 PM   Specimen: Nasopharyngeal Swab  Result Value Ref Range   SARS Coronavirus 2 NEGATIVE NEGATIVE    Comment: (NOTE) SARS-CoV-2 target nucleic acids are NOT DETECTED.  The SARS-CoV-2 RNA is generally detectable in upper and lower respiratory specimens during the acute phase of infection. The lowest concentration of SARS-CoV-2 viral copies this assay can detect is 250 copies / mL. A negative result does not preclude SARS-CoV-2 infection and should not be used as the sole basis for treatment or other patient management decisions.  A negative result may occur with improper specimen collection / handling, submission of specimen  other than nasopharyngeal swab, presence of viral mutation(s) within the areas targeted by this assay, and inadequate number of viral copies (<250 copies / mL). A negative result must be combined with clinical observations, patient history, and epidemiological information.  Fact Sheet for Patients:   StrictlyIdeas.no  Fact Sheet for Healthcare Providers: BankingDealers.co.za  This test is not yet approved or  cleared by the [place] FDA and has been authorized for detection and/or diagnosis of SARS-CoV-2 by FDA under an Emergency Use Authorization (EUA).  This EUA will remain in effect (meaning this test can be used) for the duration of the COVID-19 declaration under Section 564(b)(1) of the Act, 21 U.S.C. section 360bbb-3(b)(1), unless the authorization is terminated or revoked sooner.  Performed at [facility], [address], [place], [place] [address]   Magnesium     Status: None   Collection Time: 07/03/2020  7:40 PM  Result Value Ref Range   Magnesium 1.7 1.7 - 2.4 mg/dL    Comment: Performed at [facility], [address], [place], [place] [address]  Lactic acid, plasma     Status: Abnormal   Collection Time: 07/10/2020  8:05 PM  Result Value Ref Range   Lactic Acid, Venous 2.1 (HH) 0.5 - 1.9 mmol/L    Comment: CRITICAL RESULT CALLED TO, READ BACK BY AND VERIFIED WITH: L ANDREWS AT 2041 ON 07/04/2020 BY MOSLEY,J Performed at [facility], [address] [place], [place] [address]   CBG monitoring, ED     Status: Abnormal   Collection Time: 07/15/2020  9:51 PM  Result Value Ref Range   Glucose-Capillary 232 (H) 70 - 99 mg/dL    Comment: Glucose reference range applies only to samples taken after fasting for at least 8 hours.  Lactic acid, plasma     Status: None   Collection Time: 07/09/2020 10:09 PM  Result Value Ref Range   Lactic Acid, Venous 1.7 0.5 - 1.9 mmol/L    Comment: Performed at [facility], [address],  [place], [place] [address]  CBG monitoring, ED     Status: Abnormal   Collection Time: 07/16/2020  3:05 AM  Result Value Ref Range   Glucose-Capillary 125 (H) 70 - 99 mg/dL    Comment: Glucose reference range applies only to samples taken after fasting for at least 8 hours.  Basic metabolic panel     Status: Abnormal   Collection Time: 06/24/2020  5:51 AM  Result Value Ref Range   Sodium 136 135 - 145 mmol/L   Potassium 2.9 (L) 3.5 - 5.1 mmol/L   Chloride 102 98 - 111 mmol/L   CO2  23 22 - 32 mmol/L   Glucose, Bld 127 (H) 70 - 99 mg/dL    Comment: Glucose reference range applies only to samples taken after fasting for at least 8 hours.   BUN 12 8 - 23 mg/dL   Creatinine, Ser 0.49 (L) 0.61 - 1.24 mg/dL   Calcium 7.8 (L) 8.9 - 10.3 mg/dL   GFR calc non Af Amer >60 >60 mL/min   GFR calc Af Amer >60 >60 mL/min   Anion gap 11 5 - 15    Comment: Performed at [facility], [address], [place], [place] [address]  CBC     Status: Abnormal   Collection Time: 07/01/2020  5:51 AM  Result Value Ref Range   WBC 32.1 (H) 4.0 - 10.5 K/uL   RBC 3.06 (L) 4.22 - 5.81 MIL/uL   Hemoglobin 7.0 (L) 13.0 - 17.0 g/dL   HCT 24.6 (L) 39 - 52 %   MCV 80.4 80.0 - 100.0 fL   MCH 22.9 (L) 26.0 - 34.0 pg   MCHC 28.5 (L) 30.0 - 36.0 g/dL   RDW 17.5 (H) 11.5 - 15.5 %   Platelets 394 150 - 400 K/uL   nRBC 0.0 0.0 - 0.2 %    Comment: Performed at [facility], [address] [place], [place] [address]  Glucose, capillary     Status: Abnormal   Collection Time: 06/30/2020  6:51 AM  Result Value Ref Range   Glucose-Capillary 147 (H) 70 - 99 mg/dL    Comment: Glucose reference range applies only to samples taken after fasting for at least 8 hours.  Glucose, capillary     Status: Abnormal   Collection Time: 07/05/2020  7:44 AM  Result Value Ref Range   Glucose-Capillary 170 (H) 70 - 99 mg/dL    Comment: Glucose reference range applies only to samples taken after fasting for at least 8 hours.   Comment 1 Document in Chart    Aerobic/Anaerobic Culture (surgical/deep wound)     Status: None (Preliminary result)   Collection Time: 07/14/2020  9:30 AM   Specimen: Abscess; Wound  Result Value Ref Range   Specimen Description      ABSCESS SACRUM Performed at [facility], [address], [place], [place] [address]    Special Requests      NONE Performed at [facility], [address], [place], [place] [address]    Gram Stain      RARE WBC PRESENT,BOTH PMN AND MONONUCLEAR ABUNDANT GRAM NEGATIVE RODS MODERATE GRAM POSITIVE COCCI FEW GRAM VARIABLE ROD Performed at [facility], [place] [address], [place], [place] [address]    Culture PENDING    Report Status PENDING   Type and screen [facility]     Status: None (Preliminary result)   Collection Time: 06/30/2020 10:10 AM  Result Value Ref Range   ABO/RH(D) A POS    Antibody Screen NEG    Sample Expiration 07/11/2020,2359    Unit Number [id number]    Blood Component Type RED CELLS,LR    Unit division 00    Status of Unit ISSUED    Transfusion Status OK TO TRANSFUSE    Crossmatch Result      Compatible Performed at [facility], [address] [place],  [address]    Unit Number [id number]    Blood Component Type RED CELLS,LR    Unit division 00    Status of Unit ALLOCATED    Transfusion Status OK TO TRANSFUSE  Crossmatch Result Compatible   Prepare RBC (crossmatch)     Status: None   Collection Time: 06/27/2020 10:11 AM  Result Value Ref Range   Order Confirmation      ORDER PROCESSED BY BLOOD BANK Performed at [facility], [address] [place], [place] [address]   Glucose, capillary     Status: Abnormal   Collection Time: 06/24/2020 11:40 AM  Result Value Ref Range   Glucose-Capillary 129 (H) 70 - 99 mg/dL    Comment: Glucose reference range applies only to samples taken after fasting for at least 8 hours.  Aerobic/Anaerobic Culture (surgical/deep wound)     Status: None (Preliminary result)   Collection Time: 07/12/2020   2:33 PM   Specimen: Wound  Result Value Ref Range   Specimen Description      WOUND INGUINAL Performed at [facility], [address], [place], [place] [address]    Special Requests      NONE Performed at [facility], [address], [place], [place] [address]    Gram Stain      RARE WBC PRESENT, PREDOMINANTLY PMN ABUNDANT GRAM POSITIVE COCCI IN PAIRS MODERATE GRAM POSITIVE RODS FEW GRAM NEGATIVE RODS Performed at [facility], [place] [address], [place], [place] [address]    Culture PENDING    Report Status PENDING   Aerobic/Anaerobic Culture (surgical/deep wound)     Status: None (Preliminary result)   Collection Time: 06/22/2020  2:33 PM   Specimen: Wound  Result Value Ref Range   Specimen Description      WOUND ISCHIAL Performed at [facility], [address], [place], [place] [address]    Special Requests      NONE Performed at [facility], [address] [place], [place] [address]    Gram Stain      RARE WBC PRESENT, PREDOMINANTLY PMN MODERATE GRAM POSITIVE COCCI FEW GRAM POSITIVE RODS FEW GRAM NEGATIVE RODS Performed at [facility], [place] [address], [place], [place] [address]    Culture PENDING    Report Status PENDING   Aerobic/Anaerobic Culture (surgical/deep wound)     Status: None (Preliminary result)   Collection Time: 07/10/2020  2:33 PM   Specimen: Wound  Result Value Ref Range   Specimen Description      WOUND AKA STUMP Performed at [facility], [address], [place], [place] [address]    Special Requests      NONE Performed at [facility], [address], [place], [place] [address]    Gram Stain      ABUNDANT WBC PRESENT, PREDOMINANTLY PMN RARE GRAM POSITIVE COCCI Performed at [facility], [place] [address], [place], [place] [address]    Culture PENDING    Report Status PENDING   Blood gas, arterial     Status: Abnormal   Collection Time: 07/03/2020  6:50 PM  Result Value Ref Range   FIO2 50.00    pH, Arterial 7.463 (H) 7.35 -  7.45   pCO2 arterial 35.8 32 - 48 mmHg   pO2, Arterial 103 83 - 108 mmHg   Bicarbonate 26.0 20.0 - 28.0 mmol/L   Acid-Base Excess 1.7 0.0 - 2.0 mmol/L   O2 Saturation 98.2 %   Patient temperature 37.6    Allens test (pass/fail) PASS PASS    Comment: Performed at [facility], [address] [place], [place] [address]  Glucose, capillary     Status: Abnormal   Collection Time: 07/12/2020  7:46 PM  Result Value Ref Range   Glucose-Capillary 144 (H) 70 - 99 mg/dL    Comment: Glucose reference range applies only to samples taken after fasting for at least 8 hours.   Comment 1 Notify RN    Comment 2 Document in Chart   Glucose, capillary     Status: Abnormal   Collection Time: 07/14/20 12:07 AM  Result Value Ref Range   Glucose-Capillary 150 (H) 70 - 99 mg/dL    Comment: Glucose reference range applies only to samples taken after fasting for at least 8 hours.   Comment 1 Notify RN    Comment 2 Document in Chart   Glucose, capillary     Status: Abnormal   Collection Time: 07/14/20  4:19 AM  Result Value Ref Range   Glucose-Capillary 130 (H) 70 - 99 mg/dL    Comment: Glucose reference range applies only to samples taken after fasting for at least 8 hours.   Comment 1 Notify RN    Comment 2 Document in Chart   Triglycerides     Status: None   Collection Time: 07/14/20  6:39 AM  Result Value Ref Range   Triglycerides 64 <150 mg/dL    Comment: Performed at [facility], [address] [place], [place] [address]  Glucose, capillary     Status: Abnormal   Collection Time: 07/14/20  7:59 AM  Result Value Ref Range   Glucose-Capillary 173 (H) 70 - 99 mg/dL    Comment: Glucose reference range applies only to samples taken after fasting for at least 8 hours.  CBC     Status: Abnormal   Collection Time: 07/14/20  8:17 AM  Result Value Ref Range   WBC 22.9 (H) 4.0 - 10.5 K/uL   RBC 2.94 (L) 4.22 - 5.81 MIL/uL   Hemoglobin 6.7 (LL) 13.0 - 17.0 g/dL    Comment: REPEATED TO VERIFY THIS  CRITICAL RESULT HAS VERIFIED AND BEEN CALLED TO [facility],B BY BOBBIE MATTHEWS ON 08 24 2021 AT 0900, AND HAS BEEN READ BACK.     HCT 24.3 (L) 39 - 52 %   MCV 82.7 80.0 - 100.0 fL   MCH 22.8 (L) 26.0 - 34.0 pg   MCHC 27.6 (L) 30.0 - 36.0 g/dL   RDW 17.8 (H) 11.5 - 15.5 %   Platelets 355 150 - 400 K/uL   nRBC 0.0 0.0 - 0.2 %    Comment: Performed at [facility], [address], [place], [place] [address]  Comprehensive metabolic panel     Status: Abnormal   Collection Time: 07/14/20  8:17 AM  Result Value Ref Range   Sodium 137 135 - 145 mmol/L   Potassium 3.5 3.5 - 5.1 mmol/L    Comment: DELTA CHECK NOTED   Chloride 106 98 - 111 mmol/L   CO2 22 22 - 32 mmol/L   Glucose, Bld 139 (H) 70 - 99 mg/dL    Comment: Glucose reference range applies only to samples taken after fasting for at least 8 hours.   BUN 23 8 - 23 mg/dL   Creatinine, Ser 0.61 0.61 - 1.24 mg/dL   Calcium 7.0 (L) 8.9 - 10.3 mg/dL   Total Protein 4.3 (L) 6.5 - 8.1 g/dL   Albumin 1.4 (L) 3.5 - 5.0 g/dL   AST 1,151 (H) 15 - 41 U/L   ALT 452 (H) 0 - 44 U/L   Alkaline Phosphatase 95 38 - 126 U/L   Total Bilirubin 0.6 0.3 - 1.2 mg/dL   GFR  calc non Af Amer >60 >60 mL/min   GFR calc Af Amer >60 >60 mL/min   Anion gap 9 5 - 15    Comment: Performed at [facility], [address] [place], [place] [address]   Recent Results (from the past 240 hour(s))  Urine culture     Status: Abnormal   Collection Time: 07/19/2020  5:57 PM   Specimen: In/Out Cath Urine  Result Value Ref Range Status   Specimen Description   Final    IN/OUT CATH URINE Performed at [facility], [address] [place], [place] [address]    Special Requests   Final    NONE Performed at [facility], [address], [place], [place] [address]    Culture MULTIPLE SPECIES PRESENT, SUGGEST RECOLLECTION (A)  Final   Report Status 06/26/2020 FINAL  Final  Culture, blood (Routine x 2)     Status: None (Preliminary result)   Collection Time: 07/18/2020  6:03 PM    Specimen: Vein; Blood  Result Value Ref Range Status   Specimen Description BLOOD RIGHT HAND  Final   Special Requests   Final    BOTTLES DRAWN AEROBIC AND ANAEROBIC Blood Culture adequate volume Performed at [facility], [address] [place], [place] [address]    Culture PENDING  Incomplete   Report Status PENDING  Incomplete  Culture, blood (Routine x 2)     Status: None (Preliminary result)   Collection Time: 06/21/2020  6:03 PM   Specimen: Vein; Blood  Result Value Ref Range Status   Specimen Description BLOOD LEFT HAND  Final   Special Requests   Final    BOTTLES DRAWN AEROBIC AND ANAEROBIC Blood Culture adequate volume Performed at [facility], [address] [place], [place] [address]    Culture PENDING  Incomplete   Report Status PENDING  Incomplete  SARS Coronavirus 2 by RT PCR (hospital order, performed in [place] hospital lab) Nasopharyngeal Nasopharyngeal Swab     Status: None   Collection Time: 07/16/2020  7:21 PM   Specimen: Nasopharyngeal Swab  Result Value Ref Range Status   SARS Coronavirus 2 NEGATIVE NEGATIVE Final    Comment: (NOTE) SARS-CoV-2 target nucleic acids are NOT DETECTED.  The SARS-CoV-2 RNA is generally detectable in upper and lower respiratory specimens during the acute phase of infection. The lowest concentration of SARS-CoV-2 viral copies this assay can detect is 250 copies / mL. A negative result does not preclude SARS-CoV-2 infection and should not be used as the sole basis for treatment or other patient management decisions.  A negative result may occur with improper specimen collection / handling, submission of specimen other than nasopharyngeal swab, presence of viral mutation(s) within the areas targeted by this assay, and inadequate number of viral copies (<250 copies / mL). A negative result must be combined with clinical observations, patient history, and epidemiological information.  Fact Sheet for Patients:    StrictlyIdeas.no  Fact Sheet for Healthcare Providers: BankingDealers.co.za  This test is not yet approved or  cleared by the [place] FDA and has been authorized for detection and/or diagnosis of SARS-CoV-2 by FDA under an Emergency Use Authorization (EUA).  This EUA will remain in effect (meaning this test can be used) for the duration of the COVID-19 declaration under Section 564(b)(1) of the Act, 21 U.S.C. section 360bbb-3(b)(1), unless the authorization is terminated or revoked sooner.  Performed at [facility], [address] [place], [place] [address]   Aerobic/Anaerobic Culture (surgical/deep wound)  Status: None (Preliminary result)   Collection Time: 07/04/2020  9:30 AM   Specimen: Abscess; Wound  Result Value Ref Range Status   Specimen Description   Final    ABSCESS SACRUM Performed at [facility], [address] [place], [place] [address]    Special Requests   Final    NONE Performed at [facility], [address] [place], [place] [address]    Gram Stain   Final    RARE WBC PRESENT,BOTH PMN AND MONONUCLEAR ABUNDANT GRAM NEGATIVE RODS MODERATE GRAM POSITIVE COCCI FEW GRAM VARIABLE ROD Performed at [facility], [place] [address], [place], [place] [address]    Culture PENDING  Incomplete   Report Status PENDING  Incomplete  Aerobic/Anaerobic Culture (surgical/deep wound)     Status: None (Preliminary result)   Collection Time: 07/16/2020  2:33 PM   Specimen: Wound  Result Value Ref Range Status   Specimen Description   Final    WOUND INGUINAL Performed at [facility], [address], [place], [place] [address]    Special Requests   Final    NONE Performed at [facility], [address], [place], [place] [address]    Gram Stain   Final    RARE WBC PRESENT, PREDOMINANTLY PMN ABUNDANT GRAM POSITIVE COCCI IN PAIRS MODERATE GRAM POSITIVE RODS FEW GRAM NEGATIVE RODS Performed at [facility], [place] [address] [place], [place] [address]    Culture PENDING  Incomplete   Report Status PENDING  Incomplete  Aerobic/Anaerobic Culture (surgical/deep wound)     Status: None (Preliminary result)   Collection Time: 07/15/2020  2:33 PM   Specimen: Wound  Result Value Ref Range Status   Specimen Description   Final    WOUND ISCHIAL Performed at [facility], [address], [place], [place] [address]    Special Requests   Final    NONE Performed at [facility], [address], [place], [place] [address]    Gram Stain   Final    RARE WBC PRESENT, PREDOMINANTLY PMN MODERATE GRAM POSITIVE COCCI FEW GRAM POSITIVE RODS FEW GRAM NEGATIVE RODS Performed at [facility], [place] [address] [place], [place] [address]    Culture PENDING  Incomplete   Report Status PENDING  Incomplete  Aerobic/Anaerobic Culture (surgical/deep wound)     Status: None (Preliminary result)   Collection Time: 06/25/2020  2:33 PM   Specimen: Wound  Result Value Ref Range Status   Specimen Description   Final    WOUND AKA STUMP Performed at [facility], [address], [place], [place] [address]    Special Requests   Final    NONE Performed at [facility], 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 he will require subsequent debridement tomorrow to likely remove additional scrotal skin. Please continue Dakins dressing and change BID. Please continue broad spectrum antibiotics.  The patient  is scheduled for transfer to [facility] and  I have communicated the plan with the on call Urologist, Dr. Gloriann Loan.   Nicolette Bang 07/14/2020, 11:22 AM

## 2020-07-14 NOTE — Consult Note (Addendum)
[facility] Consult Note  Omar Todd 08-Mar-1951  [id number].    Requesting MD: Pearletha Forge Chief Complaint: Fever 102.1/pressure ulcer to the sacrum Reason for Consult: Necrotizing fasciitis  HPI:  69 year old male with a history of COPD, right AKA, type 2 diabetes with neuropathy, hypertension, bilateral carotid disease.  He has a history gangrene right foot.  He had a right BKA on 05/17/2020 by Dr. Sharol Given.  He was readmitted 7/29-06/22/2020 and underwent right AKA and wound VAC application.  He was recommended to go to skilled nursing but declined he was sent home with home health.  His home health nurse found him with a fever of 102 and what appeared to be pressure ulcers.  He was sent to the ED at [facility].  Work-up in the ED at [facility] showed the be febrile, tachycardic, tachpenic, with BP 113 range.  K+ 2.6, WBC 31.5.  She was seen in consultation by Dr. Constance Haw and was taken the operating room for necrotizing infection of the inguinal region.  Patient underwent   Sharp excisional debridement of right buttock, right inguinal region, and right scrotal skin measuring (right buttock / right inguinal wound 20cmX10cmX4cm and right scrotal skin 15cmX8cmX2cm); Drainage and packing of Right AKA wound infection 07/06/2020 Dr. Blake Divine.   Patient is hypotensive, on pressors, full vent support.  K+ 3.5, glucose 139, calcium 7.0, AST 1151, ALT 452, total bilirubin 0.6.  WBC 22.9, H/H 6.7/24.3, platelets 355,000.  Wound cultures are all pending. Patient has been transferred to [facility] and the ICU service for further treatment.  We are asked to see.  It was Dr. Constance Haw opinion patient would require a return to the OR 24 to 48 hours to further examine the wounds.     ROS: Review of Systems  Unable to perform ROS: Intubated    Family History  Problem Relation Age of Onset  . Cancer Father   . Diabetes Sister   . Stroke Brother   . Healthy Sister   .  Healthy Sister   . Colon cancer Neg Hx   . Gastric cancer Neg Hx     Past Medical History:  Diagnosis Date  . COPD (chronic obstructive pulmonary disease) ([place])   . Diabetes mellitus without complication ([place])   . Gangrene of toe of right foot ([place])   . GERD (gastroesophageal reflux disease)   . Hyperlipidemia   . Hypertension     Past Surgical History:  Procedure Laterality Date  . AMPUTATION Right 05/17/2020   Procedure: AMPUTATION BELOW RIGHT KNEE;  Surgeon: Newt Minion, MD;  Location: [place];  Service: Orthopedics;  Laterality: Right;  . AMPUTATION Right 06/19/2020   Procedure: RIGHT ABOVE KNEE AMPUTATION;  Surgeon: Newt Minion, MD;  Location: [place];  Service: Orthopedics;  Laterality: Right;  . APPLICATION OF WOUND VAC Right 05/17/2020   Procedure: APPLICATION OF WOUND VAC;  Surgeon: Newt Minion, MD;  Location: [place];  Service: Orthopedics;  Laterality: Right;  . BIOPSY  03/13/2020   Procedure: BIOPSY;  Surgeon: Daneil Dolin, MD;  [facility];  Service: Endoscopy;;  gastric biopsy  . ESOPHAGOGASTRODUODENOSCOPY N/A 03/13/2020   Procedure: ESOPHAGOGASTRODUODENOSCOPY (EGD);  Surgeon: Daneil Dolin, MD;  [facility];  Service: Endoscopy;  Laterality: N/A;  . FLEXIBLE SIGMOIDOSCOPY N/A 05/06/2020   Procedure: FLEXIBLE SIGMOIDOSCOPY;  Surgeon: Daneil Dolin, MD;  [facility];  Service: Endoscopy;  Laterality: N/A;  . Thumb surgery  Social History:  reports that he has been smoking cigarettes. He has a 30.00 pack-year smoking history. He has never used smokeless tobacco. He reports that he does not drink alcohol and does not use drugs.  Allergies:  Allergies  Allergen Reactions  . Penicillins Other (See Comments)    Pt. States he "passed out"    Medications Prior to Admission  Medication Sig Dispense Refill  . amLODipine (NORVASC) 10 MG tablet Take 1 tablet (10 mg total) by mouth daily. 30 tablet 3  . aspirin EC 81 MG tablet  Take 1 tablet (81 mg total) by mouth daily with breakfast. 30 tablet 3  . BIOTIN 5000 PO Take 5,000 mcg by mouth daily.     . cholecalciferol (VITAMIN D) 1000 UNITS tablet Take 1,000 Units by mouth daily.    Marland Kitchen docusate sodium (COLACE) 100 MG capsule Take 1 capsule (100 mg total) by mouth 2 (two) times daily. 60 capsule 0  . empagliflozin (JARDIANCE) 25 MG TABS tablet Take 25 mg by mouth daily before breakfast. 30 tablet 3  . escitalopram (LEXAPRO) 10 MG tablet Take 1 tablet (10 mg total) by mouth daily. 30 tablet 3  . ferrous sulfate 325 (65 FE) MG tablet Take 1 tablet (325 mg total) by mouth daily with breakfast. 30 tablet 1  . gabapentin (NEURONTIN) 300 MG capsule Take 1 capsule (300 mg total) by mouth 3 (three) times daily. 90 capsule 0  . hydrochlorothiazide (HYDRODIURIL) 25 MG tablet Take 1 tablet (25 mg total) by mouth daily. 30 tablet 3  . lisinopril (ZESTRIL) 40 MG tablet TAKE ONE (1) TABLET EACH DAY (Patient taking differently: Take 40 mg by mouth daily. ) 30 tablet 3  . metFORMIN (GLUCOPHAGE) 1000 MG tablet Take 1 tablet (1,000 mg total) by mouth 2 (two) times daily with a meal. 60 tablet 3  . methocarbamol (ROBAXIN) 500 MG tablet Take 1 tablet (500 mg total) by mouth every 6 (six) hours as needed for muscle spasms. 30 tablet 0  . metoprolol tartrate (LOPRESSOR) 25 MG tablet Take 1 tablet (25 mg total) by mouth 2 (two) times daily. 60 tablet 3  . montelukast (SINGULAIR) 10 MG tablet TAKE ONE TABLET DAILY AT BEDTIME (Patient taking differently: Take 10 mg by mouth at bedtime. ) 90 tablet 1  . oxyCODONE (ROXICODONE) 15 MG immediate release tablet Take 15 mg by mouth every 4 (four) hours as needed for pain.  On oxycodone since 05/2020.    . pantoprazole (PROTONIX) 40 MG tablet Take 1 tablet (40 mg total) by mouth daily. 30 tablet 2  . pravastatin (PRAVACHOL) 40 MG tablet Take 1 tablet (40 mg total) by mouth every evening. 30 tablet 5  . [name] (JANUVIA) 100 MG tablet Take 1 tablet (100  mg total) by mouth daily. 30 tablet 4    Blood pressure (!) 104/45, pulse (!) 57, temperature 98 F (36.7 C), temperature source Axillary, resp. rate 15, height 6\' 2"  (1.88 m), weight 75.4 kg, SpO2 99 %. Physical Exam:  General: chronically ill white male who is laying in bed on the Ventilator and sedated HEENT: head is normocephalic, atraumatic.  Sclera are noninjected.  PERRL.  Ears and nose without any masses or lesions.  Pt is intubated. Heart: regular, rate, and rhythm.  Normal s1,s2. No obvious murmurs, gallops, or rubs noted.  Palpable radial pulse, R AKA open with dressing in place Lungs: CTAB, on Vent Abd: soft, NT, ND, +BS, no masses, hernias, or organomegaly MS: R AKA Skin: warm  and dry.  RAKA with dressing in place, Dr. Sharol Given will follow for this.  Right groin with debridement of a portion of the scrotum, Dressing in place wet to dry, some thick colored drainage from the opened area, some necrosis, but most of it looks OK right now,  We did not remove the buttocks dressing. Neuro: Sedated on the Vent Psych: could not examine        Pictures are from [facility]  Results for orders placed or performed during the hospital encounter of 07/18/2020 (from the past 48 hour(s))  Urinalysis, Routine w reflex microscopic Urine, Clean Catch     Status: Abnormal   Collection Time: 06/23/2020  5:52 PM  Result Value Ref Range   Color, Urine YELLOW YELLOW   APPearance CLEAR CLEAR   Specific Gravity, Urine 1.027 1.005 - 1.030   pH 5.0 5.0 - 8.0   Glucose, UA >=500 (A) NEGATIVE mg/dL   Hgb urine dipstick MODERATE (A) NEGATIVE   Bilirubin Urine NEGATIVE NEGATIVE   Ketones, ur 20 (A) NEGATIVE mg/dL   Protein, ur 30 (A) NEGATIVE mg/dL   Nitrite NEGATIVE NEGATIVE   Leukocytes,Ua NEGATIVE NEGATIVE   RBC / HPF 0-5 0 - 5 RBC/hpf   WBC, UA 6-10 0 - 5 WBC/hpf   Bacteria, UA NONE SEEN NONE SEEN   Squamous Epithelial / LPF 0-5 0 - 5   Mucus PRESENT     Comment: Performed at [facility], [address], [place], [place] [address]  Urine culture     Status: Abnormal   Collection Time: 06/24/2020  5:57 PM   Specimen: In/Out Cath Urine  Result Value Ref Range   Specimen Description      IN/OUT CATH URINE Performed at [facility], [address] [place], [place] [address]    Special Requests      NONE Performed at [facility], [address] [place], [place] [address]    Culture MULTIPLE SPECIES PRESENT, SUGGEST RECOLLECTION (A)    Report Status 07/19/2020 FINAL   Comprehensive metabolic panel     Status: Abnormal   Collection Time: 07/15/2020  6:00 PM  Result Value Ref Range   Sodium 137 135 - 145 mmol/L   Potassium 2.6 (LL) 3.5 - 5.1 mmol/L    Comment: CRITICAL RESULT CALLED TO, READ BACK BY AND VERIFIED WITH: T DEVOLT AT 1845 ON 06/29/2020 BY MOSLEY,J    Chloride 97 (L) 98 - 111 mmol/L   CO2 26 22 - 32 mmol/L   Glucose, Bld 213 (H) 70 - 99 mg/dL    Comment: Glucose reference range applies only to samples taken after fasting for at least 8 hours.   BUN 17 8 - 23 mg/dL   Creatinine, Ser 0.62 0.61 - 1.24 mg/dL   Calcium 8.2 (L) 8.9 - 10.3 mg/dL   Total Protein 5.7 (L) 6.5 - 8.1 g/dL   Albumin 2.1 (L) 3.5 - 5.0 g/dL   AST 28 15 - 41 U/L   ALT 24 0 - 44 U/L   Alkaline Phosphatase 117 38 - 126 U/L   Total Bilirubin 0.9 0.3 - 1.2 mg/dL   GFR calc non Af Amer >60 >60 mL/min   GFR calc Af Amer >60 >60 mL/min   Anion gap 14 5 - 15    Comment: Performed at [facility], [address] [place], [place] [address]  CBC with Differential     Status: Abnormal   Collection Time: 07/15/2020  6:00 PM  Result Value Ref Range  WBC 31.5 (H) 4.0 - 10.5 K/uL   RBC 3.18 (L) 4.22 - 5.81 MIL/uL   Hemoglobin 7.3 (L) 13.0 - 17.0 g/dL   HCT 25.1 (L) 39 - 52 %   MCV 78.9 (L) 80.0 - 100.0 fL   MCH 23.0 (L) 26.0 - 34.0 pg   MCHC 29.1 (L) 30.0 - 36.0 g/dL   RDW 17.3 (H) 11.5 - 15.5 %   Platelets 425 (H) 150 - 400 K/uL   nRBC 0.0 0.0 - 0.2 %   Neutrophils Relative % 93 %   Neutro Abs 28.9 (H) 1.7  - 7.7 K/uL   Lymphocytes Relative 1 %   Lymphs Abs 0.4 (L) 0.7 - 4.0 K/uL   Monocytes Relative 2 %   Monocytes Absolute 0.8 0 - 1 K/uL   Eosinophils Relative 0 %   Eosinophils Absolute 0.0 0 - 0 K/uL   Basophils Relative 0 %   Basophils Absolute 0.1 0 - 0 K/uL   RBC Morphology TARGET CELLS     Comment: STOMATOCYTES   Immature Granulocytes 4 %   Abs Immature Granulocytes 1.31 (H) 0.00 - 0.07 K/uL   Target Cells PRESENT    Stomatocytes PRESENT     Comment: Performed at [facility], [address], [place], [place] [address]  Protime-INR     Status: Abnormal   Collection Time: 07/10/2020  6:00 PM  Result Value Ref Range   Prothrombin Time 15.2 11.4 - 15.2 seconds   INR 1.3 (H) 0.8 - 1.2    Comment: (NOTE) INR goal varies based on device and disease states. Performed at [facility], [address] [place], [place] [address]   APTT     Status: None   Collection Time: 06/26/2020  6:00 PM  Result Value Ref Range   aPTT 34 24 - 36 seconds    Comment: Performed at [facility], [address], [place], [place] [address]  Lactic acid, plasma     Status: None   Collection Time: 07/05/2020  6:02 PM  Result Value Ref Range   Lactic Acid, Venous 1.4 0.5 - 1.9 mmol/L    Comment: Performed at [facility], [address] [place], [place] [address]  Culture, blood (Routine x 2)     Status: None (Preliminary result)   Collection Time: 07/15/2020  6:03 PM   Specimen: Vein; Blood  Result Value Ref Range   Specimen Description BLOOD RIGHT HAND    Special Requests      BOTTLES DRAWN AEROBIC AND ANAEROBIC Blood Culture adequate volume Performed at [facility], [address] [place], [place] [address]    Culture PENDING    Report Status PENDING   Culture, blood (Routine x 2)     Status: None (Preliminary result)   Collection Time: 06/30/2020  6:03 PM   Specimen: Vein; Blood  Result Value Ref Range   Specimen Description BLOOD LEFT HAND    Special Requests      BOTTLES DRAWN AEROBIC AND ANAEROBIC  Blood Culture adequate volume Performed at [facility], [address], [place], [place] [address]    Culture PENDING    Report Status PENDING   SARS Coronavirus 2 by RT PCR (hospital order, performed in [place] hospital lab) Nasopharyngeal Nasopharyngeal Swab     Status: None   Collection Time: 07/21/2020  7:21 PM   Specimen: Nasopharyngeal Swab  Result Value Ref Range   SARS Coronavirus 2 NEGATIVE NEGATIVE    Comment: (NOTE) SARS-CoV-2 target nucleic acids are NOT  DETECTED.  The SARS-CoV-2 RNA is generally detectable in upper and lower respiratory specimens during the acute phase of infection. The lowest concentration of SARS-CoV-2 viral copies this assay can detect is 250 copies / mL. A negative result does not preclude SARS-CoV-2 infection and should not be used as the sole basis for treatment or other patient management decisions.  A negative result may occur with improper specimen collection / handling, submission of specimen other than nasopharyngeal swab, presence of viral mutation(s) within the areas targeted by this assay, and inadequate number of viral copies (<250 copies / mL). A negative result must be combined with clinical observations, patient history, and epidemiological information.  Fact Sheet for Patients:   StrictlyIdeas.no  Fact Sheet for Healthcare Providers: BankingDealers.co.za  This test is not yet approved or  cleared by the [place] FDA and has been authorized for detection and/or diagnosis of SARS-CoV-2 by FDA under an Emergency Use Authorization (EUA).  This EUA will remain in effect (meaning this test can be used) for the duration of the COVID-19 declaration under Section 564(b)(1) of the Act, 21 U.S.C. section 360bbb-3(b)(1), unless the authorization is terminated or revoked sooner.  Performed at [facility], [address], [place], [place] [address]   Magnesium     Status: None    Collection Time: 06/27/2020  7:40 PM  Result Value Ref Range   Magnesium 1.7 1.7 - 2.4 mg/dL    Comment: Performed at [facility], [address], [place], [place] [address]  Lactic acid, plasma     Status: Abnormal   Collection Time: 06/28/2020  8:05 PM  Result Value Ref Range   Lactic Acid, Venous 2.1 (HH) 0.5 - 1.9 mmol/L    Comment: CRITICAL RESULT CALLED TO, READ BACK BY AND VERIFIED WITH: L ANDREWS AT 2041 ON 07/17/2020 BY MOSLEY,J Performed at [facility], [address], [place], [place] [address]   CBG monitoring, ED     Status: Abnormal   Collection Time: 07/17/2020  9:51 PM  Result Value Ref Range   Glucose-Capillary 232 (H) 70 - 99 mg/dL    Comment: Glucose reference range applies only to samples taken after fasting for at least 8 hours.  Lactic acid, plasma     Status: None   Collection Time: 07/18/2020 10:09 PM  Result Value Ref Range   Lactic Acid, Venous 1.7 0.5 - 1.9 mmol/L    Comment: Performed at [facility], [address], [place], [place] [address]  CBG monitoring, ED     Status: Abnormal   Collection Time: 06/25/2020  3:05 AM  Result Value Ref Range   Glucose-Capillary 125 (H) 70 - 99 mg/dL    Comment: Glucose reference range applies only to samples taken after fasting for at least 8 hours.  Basic metabolic panel     Status: Abnormal   Collection Time: 07/18/2020  5:51 AM  Result Value Ref Range   Sodium 136 135 - 145 mmol/L   Potassium 2.9 (L) 3.5 - 5.1 mmol/L   Chloride 102 98 - 111 mmol/L   CO2 23 22 - 32 mmol/L   Glucose, Bld 127 (H) 70 - 99 mg/dL    Comment: Glucose reference range applies only to samples taken after fasting for at least 8 hours.   BUN 12 8 - 23 mg/dL   Creatinine, Ser 0.49 (L) 0.61 - 1.24 mg/dL   Calcium 7.8 (L) 8.9 - 10.3 mg/dL   GFR calc non Af Amer >60 >60 mL/min   GFR  calc Af Amer >60 >60 mL/min   Anion gap 11 5 - 15    Comment: Performed at [facility], [address], [place], [place] [address]  CBC     Status: Abnormal   Collection  Time: 06/24/2020  5:51 AM  Result Value Ref Range   WBC 32.1 (H) 4.0 - 10.5 K/uL   RBC 3.06 (L) 4.22 - 5.81 MIL/uL   Hemoglobin 7.0 (L) 13.0 - 17.0 g/dL   HCT 24.6 (L) 39 - 52 %   MCV 80.4 80.0 - 100.0 fL   MCH 22.9 (L) 26.0 - 34.0 pg   MCHC 28.5 (L) 30.0 - 36.0 g/dL   RDW 17.5 (H) 11.5 - 15.5 %   Platelets 394 150 - 400 K/uL   nRBC 0.0 0.0 - 0.2 %    Comment: Performed at [facility], [address] [place], [place] [address]  Glucose, capillary     Status: Abnormal   Collection Time: 07/12/2020  6:51 AM  Result Value Ref Range   Glucose-Capillary 147 (H) 70 - 99 mg/dL    Comment: Glucose reference range applies only to samples taken after fasting for at least 8 hours.  Glucose, capillary     Status: Abnormal   Collection Time: 07/08/2020  7:44 AM  Result Value Ref Range   Glucose-Capillary 170 (H) 70 - 99 mg/dL    Comment: Glucose reference range applies only to samples taken after fasting for at least 8 hours.   Comment 1 Document in Chart   Aerobic/Anaerobic Culture (surgical/deep wound)     Status: None (Preliminary result)   Collection Time: 06/22/2020  9:30 AM   Specimen: Abscess; Wound  Result Value Ref Range   Specimen Description      ABSCESS SACRUM Performed at [facility], [address], [place], [place] [address]    Special Requests      NONE Performed at [facility], [address], [place], [place] [address]    Gram Stain      RARE WBC PRESENT,BOTH PMN AND MONONUCLEAR ABUNDANT GRAM NEGATIVE RODS MODERATE GRAM POSITIVE COCCI FEW GRAM VARIABLE ROD Performed at [facility], [place] [address], [place], [place] [address]    Culture PENDING    Report Status PENDING   Type and screen [facility]     Status: None (Preliminary result)   Collection Time: 06/24/2020 10:10 AM  Result Value Ref Range   ABO/RH(D) A POS    Antibody Screen NEG    Sample Expiration 07/09/2020,2359    Unit Number [id number]    Blood Component Type RED CELLS,LR    Unit division  00    Status of Unit ALLOCATED    Transfusion Status OK TO TRANSFUSE    Crossmatch Result Compatible    Unit Number [id number]    Blood Component Type RED CELLS,LR    Unit division 00    Status of Unit ALLOCATED    Transfusion Status OK TO TRANSFUSE    Crossmatch Result      Compatible Performed at [facility], [address] [place], [place] [address]   Prepare RBC (crossmatch)     Status: None   Collection Time: 07/14/2020 10:11 AM  Result Value Ref Range   Order Confirmation      ORDER PROCESSED BY BLOOD BANK Performed at [facility], [address] [place], [place] [address]   Glucose, capillary     Status: Abnormal   Collection Time: 06/22/2020 11:40 AM  Result Value  Ref Range   Glucose-Capillary 129 (H) 70 - 99 mg/dL    Comment: Glucose reference range applies only to samples taken after fasting for at least 8 hours.  Aerobic/Anaerobic Culture (surgical/deep wound)     Status: None (Preliminary result)   Collection Time: 07/01/2020  2:33 PM   Specimen: Wound  Result Value Ref Range   Specimen Description      WOUND ISCHIAL Performed at [facility], [address], [place], [place] [address]    Special Requests      NONE Performed at [facility], [address] [place], [place] [address]    Gram Stain      RARE WBC PRESENT, PREDOMINANTLY PMN MODERATE GRAM POSITIVE COCCI FEW GRAM POSITIVE RODS FEW GRAM NEGATIVE RODS Performed at [facility], [place] [address], [place], [place] [address]    Culture PENDING    Report Status PENDING   Aerobic/Anaerobic Culture (surgical/deep wound)     Status: None (Preliminary result)   Collection Time: 07/12/2020  2:33 PM   Specimen: Wound  Result Value Ref Range   Specimen Description      WOUND AKA STUMP Performed at [facility], [address] [place], [place] [address]    Special Requests      NONE Performed at [facility], [address] [place], [place] [address]    Gram Stain      ABUNDANT WBC PRESENT,  PREDOMINANTLY PMN RARE GRAM POSITIVE COCCI Performed at [facility], [place] [address] [place], [place] [address]    Culture PENDING    Report Status PENDING   Blood gas, arterial     Status: Abnormal   Collection Time: 06/26/2020  6:50 PM  Result Value Ref Range   FIO2 50.00    pH, Arterial 7.463 (H) 7.35 - 7.45   pCO2 arterial 35.8 32 - 48 mmHg   pO2, Arterial 103 83 - 108 mmHg   Bicarbonate 26.0 20.0 - 28.0 mmol/L   Acid-Base Excess 1.7 0.0 - 2.0 mmol/L   O2 Saturation 98.2 %   Patient temperature 37.6    Allens test (pass/fail) PASS PASS    Comment: Performed at [facility], [address] [place],  [address]  Glucose, capillary     Status: Abnormal   Collection Time: 07/10/2020  7:46 PM  Result Value Ref Range   Glucose-Capillary 144 (H) 70 - 99 mg/dL    Comment: Glucose reference range applies only to samples taken after fasting for at least 8 hours.   Comment 1 Notify RN    Comment 2 Document in Chart   Glucose, capillary     Status: Abnormal   Collection Time: 07/14/20 12:07 AM  Result Value Ref Range   Glucose-Capillary 150 (H) 70 - 99 mg/dL    Comment: Glucose reference range applies only to samples taken after fasting for at least 8 hours.   Comment 1 Notify RN    Comment 2 Document in Chart   Glucose, capillary     Status: Abnormal   Collection Time: 07/14/20  4:19 AM  Result Value Ref Range   Glucose-Capillary 130 (H) 70 - 99 mg/dL    Comment: Glucose reference range applies only to samples taken after fasting for at least 8 hours.   Comment 1 Notify RN    Comment 2 Document in Chart   Triglycerides     Status: None   Collection Time: 07/14/20  6:39 AM  Result Value Ref Range   Triglycerides  64 <150 mg/dL    Comment: Performed at [facility], [address] [place], [place] [address]  Glucose, capillary     Status: Abnormal   Collection Time: 07/14/20  7:59 AM  Result Value Ref Range   Glucose-Capillary 173 (H) 70 - 99 mg/dL    Comment: Glucose  reference range applies only to samples taken after fasting for at least 8 hours.  CBC     Status: Abnormal   Collection Time: 07/14/20  8:17 AM  Result Value Ref Range   WBC 22.9 (H) 4.0 - 10.5 K/uL   RBC 2.94 (L) 4.22 - 5.81 MIL/uL   Hemoglobin 6.7 (LL) 13.0 - 17.0 g/dL    Comment: REPEATED TO VERIFY THIS CRITICAL RESULT HAS VERIFIED AND BEEN CALLED TO [facility],B BY BOBBIE MATTHEWS ON 08 24 2021 AT 0900, AND HAS BEEN READ BACK.     HCT 24.3 (L) 39 - 52 %   MCV 82.7 80.0 - 100.0 fL   MCH 22.8 (L) 26.0 - 34.0 pg   MCHC 27.6 (L) 30.0 - 36.0 g/dL   RDW 17.8 (H) 11.5 - 15.5 %   Platelets 355 150 - 400 K/uL   nRBC 0.0 0.0 - 0.2 %    Comment: Performed at [facility], [address] [place], [place] [address]  Comprehensive metabolic panel     Status: Abnormal   Collection Time: 07/14/20  8:17 AM  Result Value Ref Range   Sodium 137 135 - 145 mmol/L   Potassium 3.5 3.5 - 5.1 mmol/L    Comment: DELTA CHECK NOTED   Chloride 106 98 - 111 mmol/L   CO2 22 22 - 32 mmol/L   Glucose, Bld 139 (H) 70 - 99 mg/dL    Comment: Glucose reference range applies only to samples taken after fasting for at least 8 hours.   BUN 23 8 - 23 mg/dL   Creatinine, Ser 0.61 0.61 - 1.24 mg/dL   Calcium 7.0 (L) 8.9 - 10.3 mg/dL   Total Protein 4.3 (L) 6.5 - 8.1 g/dL   Albumin 1.4 (L) 3.5 - 5.0 g/dL   AST 1,151 (H) 15 - 41 U/L   ALT 452 (H) 0 - 44 U/L   Alkaline Phosphatase 95 38 - 126 U/L   Total Bilirubin 0.6 0.3 - 1.2 mg/dL   GFR calc non Af Amer >60 >60 mL/min   GFR calc Af Amer >60 >60 mL/min   Anion gap 9 5 - 15    Comment: Performed at [facility], [address] [place], [place] [address]   DG Sacrum/Coccyx  Result Date: 06/28/2020 CLINICAL DATA:  Decubitus ulcer.  Evaluate for osteomyelitis. EXAM: SACRUM AND COCCYX - 2+ VIEW COMPARISON:  None. FINDINGS: No acute bony abnormality identified. No radiographic evidence of osteomyelitis identified. Gas in the soft tissues is noted overlying the LOWER RIGHT  pelvis on the frontal view. IMPRESSION: 1. No acute bony abnormality. No definite evidence of acute osteomyelitis. 2. Gas in the soft tissues overlying the LOWER RIGHT pelvis which may represent this patient's decubitus ulcer but correlate clinically. Electronically Signed   By: Margarette Canada M.D.   On: 07/15/2020 20:19   CT PELVIS W CONTRAST  Result Date: 07/11/2020 CLINICAL DATA:  Sacral ulcer EXAM: CT PELVIS WITH CONTRAST TECHNIQUE: Multidetector CT imaging of the pelvis was performed using the standard protocol following the bolus administration of intravenous contrast. CONTRAST:  130mL OMNIPAQUE IOHEXOL 300 MG/ML  SOLN COMPARISON:  None. FINDINGS: Urinary Tract:  No abnormality  visualized. Bowel:  Unremarkable visualized pelvic bowel loops. 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 Large amount of calcific atherosclerosis. Reproductive:  No mass or other significant abnormality Other: There is a right sacro-ischial decubitus ulcer with gas dissecting along the right perineal soft tissues into the right retroperitoneum and right inguinal canal. No fluid collection. Musculoskeletal: No suspicious bone lesions identified. IMPRESSION: 1. Right sacro-ischial decubitus ulcer with gas dissecting along the right perineal soft tissues into the right retroperitoneum and right inguinal canal. No fluid collection. 2. 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 Recommend follow-up every 6 months and vascular consultation. This recommendation follows ACR consensus guidelines: White Paper of the ACR Incidental Findings Committee II on Vascular Findings. J Am Coll Radiol 2013; 65:784-696. 3. Aortic Atherosclerosis (ICD10-I70.0). Electronically Signed   By: Ulyses Jarred M.D.   On: 07/16/2020 23:22   DG CHEST PORT 1 VIEW  Result Date: 07/10/2020 CLINICAL DATA:  Respiratory failure EXAM: PORTABLE CHEST 1 VIEW  COMPARISON:  07/16/2020 FINDINGS: Endotracheal tube and gastric catheter are now seen in satisfactory position. Cardiac shadow is stable. Lungs are well aerated bilaterally. No focal infiltrate or sizable effusion is seen. No pneumothorax is noted. No bony abnormality is noted. IMPRESSION: Interval intubation.  No acute abnormality is noted. Electronically Signed   By: Inez Catalina M.D.   On: 07/20/2020 17:24   DG Chest Port 1 View  Result Date: 06/22/2020 CLINICAL DATA:  Fever and sacral ulcer. EXAM: PORTABLE CHEST 1 VIEW COMPARISON:  The May 15, 2020 FINDINGS: There is no evidence of acute infiltrate, pleural effusion or pneumothorax. The heart size and mediastinal contours are within normal limits. There is mild calcification of the aortic arch and tortuosity of the descending thoracic aorta. The visualized skeletal structures are unremarkable. IMPRESSION: No active disease. Electronically Signed   By: Virgina Norfolk M.D.   On: 06/21/2020 18:37   [place] EKG SITE RITE  Result Date: 07/14/2020 If Site Rite image not attached, placement could not be confirmed due to current cardiac rhythm.  . ceFEPime (MAXIPIME) IV 2 g (07/14/20 1345)  . clindamycin (CLEOCIN) IV Stopped (07/14/20 1330)  . fentaNYL infusion INTRAVENOUS 125 mcg/hr (07/14/20 1244)  . lactated ringers 100 mL/hr at 07/14/20 0704  . phenylephrine (NEO-SYNEPHRINE) Adult infusion 100 mcg/min (07/14/20 1402)  . phenylephrine    . propofol (DIPRIVAN) infusion 20 mcg/kg/min (07/14/20 1245)  . vancomycin Stopped (07/14/20 2952)      Assessment/Plan Sepsis with necrotizing fasciitis/hypotension Ventilator dependent respiratory failure Anemia  COPD Type 2 diabetes with neuropathy Hypertension Right AKA 7/30  - Dr. Sharol Given Elevated transaminase  - AST 1151/ALT 452   Necrotizing infection/Fournier's gangrene of the right buttocks, right inguinal region, right scrotum; right above-the-knee amputation incision infection 1.  Sharp  excisional debridement of right buttock, right inguinal region, and right scrotal skin measuring (right buttock / right inguinal wound 20cmX10cmX4cm and right scrotal skin 15cmX8cmX2cm); Drainage and packing of Right AKA wound infection 07/17/2020 Dr. Natividad Brood)  FEN: IV fluids/n.p.o. ID: Cipro x 18/22;  Flagyl x 2 8/23; Maxipime 8/23 >> day 2; Clindamycin 8/23 >> day 2 Vancomycin 8/22>> day 3 DVT:  SCD's  Plan:  Continued medical management today.  We will plan to take back to the OR tomorrow for further incision & debridement of necrotizing fasciitis.  Defer R AKA to Dr. Sharol Given. Will ask urology to see also for scrotal debridement.      Earnstine Regal, [facility]  Surgery 07/14/2020, 9:25 AM Please see Amion for pager number during day hours 7:00am-4:30pm

## 2020-07-14 NOTE — Progress Notes (Signed)
PROGRESS NOTE    Omar Todd  QPY:[id number] DOB: 04-16-1951 DOA: 07/07/2020 PCP: [name], DO    Brief Narrative:  HPI: Omar Todd is a 69 y.o. male with medical history significant for COPD, right AKA, DM, HTN, bilateral carotid artery disease. Patient was sent to the ED by home health RN for pressure ulcer to sacrum and temperature of 102.1.  In the time of my evaluation patient initially appeared to be asleep, but to talk she arouses, becomes awake and alert, answers a few questions, asks for a bedside urinal. Patient tells me he has had 2 ulcer since he left the hospital.  No difficulty breathing, no cough, no chest pain, no abdominal pain, no vomiting, no loose stools, denies pain with urination.  Recent hospitalization 7/ 29-06/22/20-for right BKA stump infection with mixed flexion contracture of the hip.  Patient had above-knee amputation by Dr. Sharol Given 06/19/2020.  Physical therapy recommended skilled nursing facility, but patient declined and opted to go home with home health.  ED Course: 100.2, tachycardic to 108, tachypneic to 24, blood pressure systolic 124P to 809X.  UA not suggestive of infection.  Potassium 2.6.  WBC 31.5.  Lactic acid 1.4 > 2.1. Chest x-ray without acute abnormality.  X-ray of the sacrum without acute bony abnormalities, no evidence of acute osteomyelitis, shows gas in the soft tissues overlying the lower right pelvis.  Patient was started on ciprofloxacin and clindamycin and vancomycin.  Hospitalist admit for further evaluation and management.   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 Severe sepsis due to infected pressure ulcer of buttock  and concerns for underlying necrotizing fasciitis of the groin.  Patient presented with high WBC count, tachycardia, low-grade fever and elevated lactic acid of 2.1.  He was started on intravenous antibiotics.  CT of the pelvis shows subcutaneous gas tracking into the right perineal soft tissues.  Seen by general surgery and underwent extensive debridement. Case was reviewed with urology since scrotum was also debrided. Due to need of recurrent debridements, patient remains intubated and is being transferred to [facility] for further care. Currently, he remains intubated and is requiring phenylephrine infusion to support blood pressure. Critical care consulted. 2. Anemia, likely related to chronic disease.  Baseline hemoglobin appears to run between 7-8.  Hemoglobin was 7 on admission and he received 1 unit of prbc. Follow up hemoglobin was 6.7, so another unit of prbc has been ordered.   3. Hypokalemia.  Replace. 4. Recent right above-the-knee amputation on 7/30.  Followed by Dr. Sharol Given. Purulent drainage noted from stump. This was probed by Dr. Constance Haw in the [place] and it was noted that patient had a large cavity laterally where wound did not heal and a smaller cavity medially. Cultures were obtained. Saline dampened kerlix was placed and ABD pad. 5. COPD.  Currently stable.  No wheezing.  Continue bronchodilators as needed. 6. Type 2 diabetes with neuropathy.  Continue on sliding scale insulin.  Chronically on Jardiance, Metformin and Januvia which are currently on hold.  Continue gabapentin for neuropathy. 7. Hypertension.  Holding hydrochlorothiazide and lisinopril.  He also takes amlodipine and metoprolol.  Holding antihypertensives for now in light of developing  sepsis. 8. Elevated LFTs. Noted to have elevated liver enzymes this morning. Suspect this is related to ischemic injury from recent hypotension post operatively. Continue to follow.   DVT prophylaxis: Place and maintain sequential compression device  Start: 07/14/20 0934 SCDs Start: 06/30/2020 2143  Code Status: full code Family Communication: discussed with sister Jaclyn Carew over the phone Disposition Plan: Status is: Inpatient  Remains inpatient appropriate because:Inpatient level of care appropriate due to severity of illness   Dispo: The patient is from: Home              Anticipated d/c is to: TBD              Anticipated d/c date is: > 3 days              Patient currently is not medically stable to d/c. Patient remains intubated on ventilator. He will need serial debridements and critical care. Discussed with Dr. Constance Haw and it was recommended that patient be transferred to [facility]. Dr. Constance Haw has discussed case with [place] surgery to follow. Case also discussed with Dr. Tacy Learn who accepted patient to [facility] service.    Consultants:   Gen surgery  Procedures:   8/23 Sharp excisional debridement of right buttock, right inguinal region, and right scrotal skin measuring (right buttock / right inguinal wound 20cmX10cmX4cm and right scrotal skin 15cmX8cmX2cm);   8/23 Drainage and packing of Right AKA wound infection   8/23 ETT>  8/24 PICC line>  Antimicrobials:   Vancomycin 8/22>  Metronidazole 8/22>8/23  Cefepime 8/22>   Clindamycin 8/23>  Subjective: Intubated and sedated  Objective: Vitals:   07/14/20 1030 07/14/20 1045 07/14/20 1056 07/14/20 1100  BP: (!) 112/48 (!) 111/46 (!) 111/46 (!) 111/47  Pulse: 63 64 65 63  Resp: _0 Temp:   97.8 F (36.6 C)   TempSrc:   Axillary   SpO2: 100% 100% 100% 100%  Weight:      Height:        Intake/Output Summary (Last 24 hours) at 07/14/2020 1107 Last data filed at 07/14/2020 0704 Gross per 24 hour  Intake 3711.32 ml  Output 350 ml  Net 3361.32 ml   Filed Weights   07/19/2020 1748 06/22/2020 1643  Weight: 78 kg 75.4 kg    Examination:  General exam: Appears calm and comfortable  Respiratory system: Clear to auscultation. Respiratory effort  normal. Cardiovascular system: S1 & S2 heard, RRR. No JVD, murmurs, rubs, gallops or clicks. No pedal edema. Gastrointestinal system: Abdomen is nondistended, soft and nontender. No organomegaly or masses felt. Normal bowel sounds heard. GU: scrotum is red, tender and swollen Central nervous system: No focal neurological deficits. Extremities: right AKA Skin: large ulcer, foul smelling over right buttocks Psychiatry: mildly confused, able to follow commands    Data Reviewed: I have personally reviewed following labs and imaging studies  CBC: Recent Labs  Lab 07/18/2020 1800 07/18/2020 0551 07/14/20 0817  WBC 31.5* 32.1* 22.9*  NEUTROABS 28.9*  --   --   HGB 7.3* 7.0* 6.7*  HCT 25.1* 24.6* 24.3*  MCV 78.9* 80.4 82.7  PLT 425* 394 160   Basic Metabolic Panel: Recent Labs  Lab 07/18/2020 1800 07/05/2020 1940 07/04/2020 0551 07/14/20 0817  NA 137  --  136 137  K 2.6*  --  2.9* 3.5  CL 97*  --  102 106  CO2 26  --  23 22  GLUCOSE 213*  --  127* 139*  BUN 17  --  12 23  CREATININE 0.62  --  0.49* 0.61  CALCIUM 8.2*  --  7.8* 7.0*  MG  --  1.7  --   --    GFR: Estimated Creatinine Clearance: 92.9 mL/min (by C-G formula based on SCr of 0.61 mg/dL). Liver Function Tests: Recent Labs  Lab 07/18/2020 1800 07/14/20 0817  AST 28 1,151*  ALT 24 452*  ALKPHOS 117 95  BILITOT 0.9 0.6  PROT 5.7* 4.3*  ALBUMIN 2.1* 1.4*   No results for input(s): LIPASE, AMYLASE in the last 168 hours. No results for input(s): AMMONIA in the last 168 hours. Coagulation Profile: Recent Labs  Lab 07/19/2020 1800  INR 1.3*   Cardiac Enzymes: No results for input(s): CKTOTAL, CKMB, CKMBINDEX, TROPONINI in the last 168 hours. BNP (last 3 results) No results for input(s): PROBNP in the last 8760 hours. HbA1C: No results for input(s): HGBA1C in the last 72 hours. CBG: Recent Labs  Lab 06/25/2020 1140 07/19/2020 1946 07/14/20 0007 07/14/20 0419 07/14/20 0759  GLUCAP 129* 144* 150* 130* 173*    Lipid Profile: Recent Labs    07/14/20 0639  TRIG 64   Thyroid Function Tests: No results for input(s): TSH, T4TOTAL, FREET4, T3FREE, THYROIDAB in the last 72 hours. Anemia Panel: No results for input(s): VITAMINB12, FOLATE, FERRITIN, TIBC, IRON, RETICCTPCT in the last 72 hours. Sepsis Labs: Recent Labs  Lab 07/01/2020 1802 07/04/2020 2005 07/06/2020 2209  LATICACIDVEN 1.4 2.1* 1.7    Recent Results (from the past 240 hour(s))  Urine culture     Status: Abnormal   Collection Time: 07/21/2020  5:57 PM   Specimen: In/Out Cath Urine  Result Value Ref Range Status   Specimen Description   Final    IN/OUT CATH URINE Performed at [facility], [address], [place], [place] [address]    Special Requests   Final    NONE Performed at [facility], [address], [place], [place] [address]    Culture MULTIPLE SPECIES PRESENT, SUGGEST RECOLLECTION (A)  Final   Report Status 07/17/2020 FINAL  Final  Culture, blood (Routine x 2)     Status: None (Preliminary result)   Collection Time: 07/04/2020  6:03 PM   Specimen: Vein; Blood  Result Value Ref Range Status   Specimen Description BLOOD RIGHT HAND  Final   Special Requests   Final    BOTTLES DRAWN AEROBIC AND ANAEROBIC Blood Culture adequate volume Performed at [facility], [address], [place], [place] [address]    Culture PENDING  Incomplete   Report Status PENDING  Incomplete  Culture, blood (Routine x 2)     Status: None (Preliminary result)   Collection Time: 07/19/2020  6:03 PM   Specimen: Vein; Blood  Result Value Ref Range Status   Specimen Description BLOOD LEFT HAND  Final   Special Requests   Final    BOTTLES DRAWN AEROBIC AND ANAEROBIC Blood Culture adequate volume Performed at [facility], [address] [place], [place] [address]    Culture PENDING  Incomplete   Report Status PENDING  Incomplete  SARS Coronavirus 2 by RT PCR (hospital order, performed in [place] hospital lab) Nasopharyngeal Nasopharyngeal  Swab     Status: None   Collection Time: 07/03/2020  7:21 PM   Specimen: Nasopharyngeal Swab  Result Value Ref Range Status   SARS Coronavirus 2 NEGATIVE NEGATIVE Final    Comment: (NOTE) SARS-CoV-2 target nucleic acids are NOT DETECTED.  The SARS-CoV-2 RNA is generally detectable in upper and  lower respiratory specimens during the acute phase of infection. The lowest concentration of SARS-CoV-2 viral copies this assay can detect is 250 copies / mL. A negative result does not preclude SARS-CoV-2 infection and should not be used as the sole basis for treatment or other patient management decisions.  A negative result may occur with improper specimen collection / handling, submission of specimen other than nasopharyngeal swab, presence of viral mutation(s) within the areas targeted by this assay, and inadequate number of viral copies (<250 copies / mL). A negative result must be combined with clinical observations, patient history, and epidemiological information.  Fact Sheet for Patients:   StrictlyIdeas.no  Fact Sheet for Healthcare Providers: BankingDealers.co.za  This test is not yet approved or  cleared by the [place] FDA and has been authorized for detection and/or diagnosis of SARS-CoV-2 by FDA under an Emergency Use Authorization (EUA).  This EUA will remain in effect (meaning this test can be used) for the duration of the COVID-19 declaration under Section 564(b)(1) of the Act, 21 U.S.C. section 360bbb-3(b)(1), unless the authorization is terminated or revoked sooner.  Performed at [facility], [address] [place], [place] [address]   Aerobic/Anaerobic Culture (surgical/deep wound)     Status: None (Preliminary result)   Collection Time: 06/26/2020  9:30 AM   Specimen: Abscess; Wound  Result Value Ref Range Status   Specimen Description   Final    ABSCESS SACRUM Performed at [facility], [address]  [place], [place] [address]    Special Requests   Final    NONE Performed at [facility], [address], [place], [place] [address]    Gram Stain   Final    RARE WBC PRESENT,BOTH PMN AND MONONUCLEAR ABUNDANT GRAM NEGATIVE RODS MODERATE GRAM POSITIVE COCCI FEW GRAM VARIABLE ROD Performed at [facility], [place] [address] [place], [place] [address]    Culture PENDING  Incomplete   Report Status PENDING  Incomplete  Aerobic/Anaerobic Culture (surgical/deep wound)     Status: None (Preliminary result)   Collection Time: 07/14/2020  2:33 PM   Specimen: Wound  Result Value Ref Range Status   Specimen Description   Final    WOUND INGUINAL Performed at [facility], [address], [place], [place] [address]    Special Requests   Final    NONE Performed at [facility], [address] [place], [place] [address]    Gram Stain   Final    RARE WBC PRESENT, PREDOMINANTLY PMN ABUNDANT GRAM POSITIVE COCCI IN PAIRS MODERATE GRAM POSITIVE RODS FEW GRAM NEGATIVE RODS Performed at [facility], [place] [address] [place], [place] [address]    Culture PENDING  Incomplete   Report Status PENDING  Incomplete  Aerobic/Anaerobic Culture (surgical/deep wound)     Status: None (Preliminary result)   Collection Time: 06/29/2020  2:33 PM   Specimen: Wound  Result Value Ref Range Status   Specimen Description   Final    WOUND ISCHIAL Performed at [facility], [address], [place], [place] [address]    Special Requests   Final    NONE Performed at [facility], [address], [place], [place] [address]    Gram Stain   Final    RARE WBC PRESENT, PREDOMINANTLY PMN MODERATE GRAM POSITIVE COCCI FEW GRAM POSITIVE RODS FEW GRAM NEGATIVE RODS Performed at [facility], [place] [address], [place],  [address]    Culture PENDING  Incomplete   Report Status  PENDING  Incomplete  Aerobic/Anaerobic Culture (surgical/deep wound)     Status: None (Preliminary result)   Collection Time:  07/07/2020  2:33 PM   Specimen: Wound  Result Value Ref Range Status   Specimen Description   Final    WOUND AKA STUMP Performed at [facility], [address] [place], [place] [address]    Special Requests   Final    NONE Performed at [facility], [address] [place], [place] [address]    Gram Stain   Final    ABUNDANT WBC PRESENT, PREDOMINANTLY PMN RARE GRAM POSITIVE COCCI Performed at [facility], [place] [address], [place], [place] [address]    Culture PENDING  Incomplete   Report Status PENDING  Incomplete         Radiology Studies: DG Sacrum/Coccyx  Result Date: 06/29/2020 CLINICAL DATA:  Decubitus ulcer.  Evaluate for osteomyelitis. EXAM: SACRUM AND COCCYX - 2+ VIEW COMPARISON:  None. FINDINGS: No acute bony abnormality identified. No radiographic evidence of osteomyelitis identified. Gas in the soft tissues is noted overlying the LOWER RIGHT pelvis on the frontal view. IMPRESSION: 1. No acute bony abnormality. No definite evidence of acute osteomyelitis. 2. Gas in the soft tissues overlying the LOWER RIGHT pelvis which may represent this patient's decubitus ulcer but correlate clinically. Electronically Signed   By: Margarette Canada M.D.   On: 06/30/2020 20:19   CT PELVIS W CONTRAST  Result Date: 06/25/2020 CLINICAL DATA:  Sacral ulcer EXAM: CT PELVIS WITH CONTRAST TECHNIQUE: Multidetector CT imaging of the pelvis was performed using the standard protocol following the bolus administration of intravenous contrast. CONTRAST:  152m OMNIPAQUE IOHEXOL 300 MG/ML  SOLN COMPARISON:  None. FINDINGS: Urinary Tract:  No abnormality visualized. Bowel:  Unremarkable visualized pelvic bowel loops. 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 Large amount of calcific atherosclerosis. Reproductive:  No mass or other significant abnormality Other: There is a right sacro-ischial decubitus ulcer with gas dissecting along the right  perineal soft tissues into the right retroperitoneum and right inguinal canal. No fluid collection. Musculoskeletal: No suspicious bone lesions identified. IMPRESSION: 1. Right sacro-ischial decubitus ulcer with gas dissecting along the right perineal soft tissues into the right retroperitoneum and right inguinal canal. No fluid collection. 2. 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 Recommend follow-up every 6 months and vascular consultation. This recommendation follows ACR consensus guidelines: White Paper of the ACR Incidental Findings Committee II on Vascular Findings. J Am Coll Radiol 2013;; 06:269-485 3. Aortic Atherosclerosis (ICD10-I70.0). Electronically Signed   By: KUlyses JarredM.D.   On: 07/18/2020 23:22   DG CHEST PORT 1 VIEW  Result Date: 07/04/2020 CLINICAL DATA:  Respiratory failure EXAM: PORTABLE CHEST 1 VIEW COMPARISON:  06/23/2020 FINDINGS: Endotracheal tube and gastric catheter are now seen in satisfactory position. Cardiac shadow is stable. Lungs are well aerated bilaterally. No focal infiltrate or sizable effusion is seen. No pneumothorax is noted. No bony abnormality is noted. IMPRESSION: Interval intubation.  No acute abnormality is noted. Electronically Signed   By: MInez CatalinaM.D.   On: 07/19/2020 17:24   DG Chest Port 1 View  Result Date: 07/09/2020 CLINICAL DATA:  Fever and sacral ulcer. EXAM: PORTABLE CHEST 1 VIEW COMPARISON:  The May 15, 2020 FINDINGS: There is no evidence of acute infiltrate, pleural effusion or pneumothorax. The heart size and mediastinal contours are within normal limits. There is mild calcification of the aortic arch and tortuosity of  the descending thoracic aorta. The visualized skeletal structures are unremarkable. IMPRESSION: No active disease. Electronically Signed   By: Virgina Norfolk M.D.   On: 07/17/2020 18:37   [place] EKG SITE RITE  Result Date: 07/14/2020 If Site Rite image not attached,  placement could not be confirmed due to current cardiac rhythm.       Scheduled Meds: . sodium chloride   Intravenous Once  . sodium chloride   Intravenous Once  . chlorhexidine gluconate (MEDLINE KIT)  15 mL Mouth Rinse BID  . Chlorhexidine Gluconate Cloth  6 each Topical Daily  . docusate  100 mg Oral BID  . gabapentin  300 mg Oral TID  . insulin aspart  0-9 Units Subcutaneous Q4H  . mouth rinse  15 mL Mouth Rinse 10 times per day  . pantoprazole (PROTONIX) IV  40 mg Intravenous Daily  . polyethylene glycol  17 g Oral Daily  . sodium chloride flush  10-40 mL Intracatheter Q12H   Continuous Infusions: . ceFEPime (MAXIPIME) IV Stopped (07/14/20 2122)  . clindamycin (CLEOCIN) IV Stopped (07/14/20 0645)  . fentaNYL infusion INTRAVENOUS 125 mcg/hr (07/14/20 0704)  . lactated ringers 100 mL/hr at 07/14/20 0704  . phenylephrine (NEO-SYNEPHRINE) Adult infusion 90 mcg/min (07/14/20 0956)  . propofol (DIPRIVAN) infusion 20 mcg/kg/min (07/14/20 0704)  . vancomycin Stopped (07/14/20 0605)     LOS: 2 days   Critical care procedure note Authorized and performed by: Kathie Dike Total critical care time: Approximately 35 minutes Due to high probability of clinically significant, life-threatening deterioration, the patient required my highest level of preparedness to intervene emergently and I personally spent this critical care time directly and personally managing the patient.  The critical care time included obtaining a history, examining the patient, pulse oximetry, ordering and review of studies, arranging urgent treatment with development of a management plan, evaluation of patient's response to treatment, frequent reassessment, discussions with other providers.  Critical care time was performed to assess and manage the high probability of imminent, life-threatening deterioration that could result in multiorgan failure.  It was exclusive of separate billable procedures and treating other  patients and teaching time.  Please see MDM section and the rest of the of note for further information on patient assessment and treatment     Kathie Dike, MD [facility]   If 7PM-7AM, please contact night-coverage [email or web address]  07/14/2020, 11:07 AM

## 2020-07-14 NOTE — H&P (Signed)
NAME:  Omar Todd, MRN:  [id number], DOB:  06-21-1951, LOS: 2 ADMISSION DATE:  06/21/2020, CONSULTATION DATE:  07/14/20 REFERRING MD:  [name] trnasfer, CHIEF COMPLAINT:  [name] at [facility] gangrene on ventilator  History of present illness   28 oack current smoker with DM, COPD Nos, BP, lipid, GERD. 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 At some pont also has stage 2 sacral decub "Size of a quarter"- described at time of this discharge. Discharged to ? Rehab and then signed out ? AMA from rehab.   Readimmited to [facility] 06/28/2020 with sepsis syndrome(fever + SIRS with lactate 2.1) with growing. Rt buttock ulcer noted to be stage 3 large wth fecal soiling but R AKA stump incision well healed but  By 07/14/2020 purulent drainage noted. CT pelvis c/w necrotizing fasciitis (with sub cut gas) in right perineal soft tissue and bedside exam was concerning for crepitus and concern for Fournier gangrene. Patient noticed to be disoriented by 07/17/2020  On 06/25/2020 Hervey Ard excisional debridement of right buttock, right inguinal region, and right scrotal skin measuring (right buttock / right inguinal wound 20cmX10cmX4cm and right scrotal skin 15cmX8cmX2cm); Drainage and packing of Right AKA wound infection  -. 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 Concern for need for repeated debridement and urology involvement and patient transferred to [facility] from [facility] on 07/14/20. Upon arrival getting PRBC for hgb < 7gm% and sedatedh diprivan gtt and fent gtt on ventilator needing neo for bp support.        Decision maker:  he has a wife at [facility] with dementia and estranged children he has not  seen since they were babies.Rishaan Gunner [phone]  She is his closest relative and decision maker. She does not have health care power of attorney.    Past Medical History     has a past medical history of COPD (chronic obstructive pulmonary disease) ([place]), Diabetes mellitus without complication ([place]), Gangrene of toe of right foot ([place]), GERD (gastroesophageal reflux disease), Hyperlipidemia, and Hypertension.   reports that he has been smoking cigarettes. He has a 30.00 pack-year smoking history. He has never used smokeless tobacco.  Past Surgical History:  Procedure Laterality Date  . AMPUTATION Right 05/17/2020   Procedure: AMPUTATION BELOW RIGHT KNEE;  Surgeon: Newt Minion, MD;  Location: [place];  Service: Orthopedics;  Laterality: Right;  . AMPUTATION Right 06/19/2020   Procedure: RIGHT ABOVE KNEE AMPUTATION;  Surgeon: Newt Minion, MD;  Location: [place];  Service: Orthopedics;  Laterality: Right;  . APPLICATION OF WOUND VAC Right 05/17/2020   Procedure: APPLICATION OF WOUND VAC;  Surgeon: Newt Minion, MD;  Location: [place];  Service: Orthopedics;  Laterality: Right;  . BIOPSY  03/13/2020   Procedure: BIOPSY;  Surgeon: Daneil Dolin, MD;  [facility];  Service: Endoscopy;;  gastric biopsy  . ESOPHAGOGASTRODUODENOSCOPY N/A 03/13/2020   Procedure: ESOPHAGOGASTRODUODENOSCOPY (EGD);  Surgeon: Daneil Dolin, MD;  [facility];  Service: Endoscopy;  Laterality: N/A;  . FLEXIBLE SIGMOIDOSCOPY N/A 05/06/2020   Procedure: FLEXIBLE SIGMOIDOSCOPY;  Surgeon: Daneil Dolin, MD;  [facility];  Service:  Endoscopy;  Laterality: N/A;  . Thumb surgery      Allergies  Allergen Reactions  . Penicillins Other (See Comments)    Pt. States he "passed out"    Immunization History  Administered Date(s) Administered  . Fluad Quad(high Dose 65+) 08/21/2019  . Influenza, High Dose Seasonal PF 09/21/2017, 08/29/2018  . Influenza,inj,Quad PF,6+ Mos 08/21/2013,  08/25/2014, 08/31/2015, 09/14/2016  . Pneumococcal Conjugate-13 02/12/2016  . Pneumococcal Polysaccharide-23 11/29/2013, 01/04/2019    Family History  Problem Relation Age of Onset  . Cancer Father   . Diabetes Sister   . Stroke Brother   . Healthy Sister   . Healthy Sister   . Colon cancer Neg Hx   . Gastric cancer Neg Hx      Current Facility-Administered Medications:  .  0.9 %  sodium chloride infusion (Manually program via Guardrails IV Fluids), , Intravenous, Once, Virl Cagey, MD, Stopped at 06/29/2020 1800 .  acetaminophen (TYLENOL) tablet 650 mg, 650 mg, Oral, Q6H PRN, 650 mg at 07/17/2020 2148 **OR** acetaminophen (TYLENOL) suppository 650 mg, 650 mg, Rectal, Q6H PRN, Virl Cagey, MD .  ceFEPIme (MAXIPIME) 2 g in sodium chloride 0.9 % 100 mL IVPB, 2 g, Intravenous, Q8H, Virl Cagey, MD, Stopped at 07/14/20 [phone] .  chlorhexidine gluconate (MEDLINE KIT) (PERIDEX) 0.12 % solution 15 mL, 15 mL, Mouth Rinse, BID, Memon, Jehanzeb, MD, 15 mL at 07/14/20 0805 .  Chlorhexidine Gluconate Cloth 2 % PADS 6 each, 6 each, Topical, Daily, Kathie Dike, MD, 6 each at 07/14/20 0805 .  clindamycin (CLEOCIN) IVPB 600 mg, 600 mg, Intravenous, Q6H, Virl Cagey, MD, Last Rate: 100 mL/hr at 07/14/20 1300, 600 mg at 07/14/20 1300 .  docusate (COLACE) 50 MG/5ML liquid 100 mg, 100 mg, Oral, BID, Memon, Jehanzeb, MD .  fentaNYL (SUBLIMAZE) bolus via infusion 25 mcg, 25 mcg, Intravenous, Q15 min PRN, Memon, Jehanzeb, MD .  fentaNYL 2547mg in NS 2591m(1057mml) infusion-PREMIX, 25-200 mcg/hr, Intravenous, Continuous, Memon, Jehanzeb, MD, Last Rate: 12.5 mL/hr at 07/14/20 1244, 125 mcg/hr at 07/14/20 1244 .  gabapentin (NEURONTIN) capsule 300 mg, 300 mg, Oral, TID, BriVirl CageyD, 300 mg at 07/14/20 1010 .  insulin aspart (novoLOG) injection 0-9 Units, 0-9 Units, Subcutaneous, Q4H, BriVirl CageyD, 2 Units at 07/14/20 0805 .  lactated ringers infusion, ,  Intravenous, Continuous, BriVirl CageyD, Last Rate: 100 mL/hr at 07/14/20 0704, Rate Verify at 07/14/20 0704 .  MEDLINE mouth rinse, 15 mL, Mouth Rinse, 10 times per day, MemKathie DikeD, 15 mL at 07/14/20 1301 .  ondansetron (ZOFRAN) tablet 4 mg, 4 mg, Oral, Q6H PRN **OR** ondansetron (ZOFRAN) injection 4 mg, 4 mg, Intravenous, Q6H PRN, BriVirl CageyD .  pantoprazole (PROTONIX) injection 40 mg, 40 mg, Intravenous, Daily, Memon, Jehanzeb, MD, 40 mg at 07/14/20 1010 .  phenylephrine (NEO-SYNEPHRINE) 20 mg in sodium chloride 0.9 % 250 mL (0.08 mg/mL) infusion, 0-400 mcg/min, Intravenous, Titrated, Memon, Jehanzeb, MD, Last Rate: 67.5 mL/hr at 07/14/20 0956, 90 mcg/min at 07/14/20 0956 .  polyethylene glycol (MIRALAX / GLYCOLAX) packet 17 g, 17 g, Oral, Daily PRN, BriCurlene Labrum MD .  polyethylene glycol (MIRALAX / GLYCOLAX) packet 17 g, 17 g, Oral, Daily, Memon, Jehanzeb, MD .  propofol (DIPRIVAN) 1000 MG/100ML infusion, 0-50 mcg/kg/min, Intravenous, Continuous, Memon, Jehanzeb, MD, Last Rate: 9.36 mL/hr at 07/14/20 1245, 20 mcg/kg/min at 07/14/20 1245 .  sodium chloride flush (NS) 0.9 % injection 10-40 mL, 10-40 mL, Intracatheter, Q12H, Memon,  Jolaine Artist, MD, 10 mL at 07/14/20 1000 .  sodium chloride flush (NS) 0.9 % injection 10-40 mL, 10-40 mL, Intracatheter, PRN, Kathie Dike, MD .  vancomycin (VANCOREADY) IVPB 750 mg/150 mL, 750 mg, Intravenous, Q8H, Virl Cagey, MD, Stopped at 07/14/20 [facility] Events   07/08/2020 - [facility] admit 07/14/20  - transfer to [facility]:  8/24 -   Procedures:  8/23 -  On 06/27/2020 Hervey Ard excisional debridement of right buttock, right inguinal region, and right scrotal skin measuring (right buttock / right inguinal wound 20cmX10cmX4cm and right scrotal skin 15cmX8cmX2cm); Drainage and packing of Right AKA wound infection ETT 823  RUE PIcc 8/24  Significant Diagnostic Tests:  x  Micro Data:    Microbiology results: 8/22 BCx:  8/22 UCx:   8/22 Wound cx   Antimicrobials:  Antimicrobials this admission: 8/22 Cipro x 1 8/22 Clinda x 1 8/22 Flagyl >> 8/22 Vanc >> 8/22 Cefepime>>   Interim history/subjective:  8/24 - seen at [facility] 1225  Objective   Blood pressure (!) 115/46, pulse 61, temperature 98.2 F (36.8 C), temperature source Oral, resp. rate 15, height '6\' 2"'  (1.88 m), weight 80.7 kg, SpO2 100 %.    Vent Mode: PRVC FiO2 (%):  [40 %-50 %] 40 % Set Rate:  [15 bmp] 15 bmp Vt Set:  [650 mL] 650 mL PEEP:  [5 cmH20] 5 cmH20 Plateau Pressure:  [13 cmH20-15 cmH20] 15 cmH20   Intake/Output Summary (Last 24 hours) at 07/14/2020 1305 Last data filed at 07/14/2020 0957 Gross per 24 hour  Intake 5231.35 ml  Output 550 ml  Net 4681.35 ml   Filed Weights   07/19/2020 1748 07/08/2020 1643 07/14/20 1300  Weight: 78 kg 75.4 kg 80.7 kg    Examination: General: critically ill looking male. On vent HENT: ETT tube + Lungs: sync with vent, Fio2 40% Cardiovascular: RRR Abdomen: soft Extremities: Rt AKA dressing +, skin excoriations in LLE Neuro: RASS -3 on sedation GU: Rt scrotum open wound  Resolved Hospital Problem list   x  Assessment & Plan:  ASSESSMENT / PLAN:  PULMONARY  A:  Baseline COPD NOS Acute post op respiratory failure in setting of infected sacral ulcer and Fournier gangrene 07/06/2020.   07/14/2020 -> on 40% fio2  P:   BD PRVC VAP bundle Leave intubaed due to repeat needs to go to OR   NEUROLOGIC A:   Acute encephalopathy in setting of sepsis 07/11/2020 and pre-op. Left sedated post op on ventilator  P:   RASS goal 0 to -2 Fent gtt Diprivan gtt   VASCULAR A:   Circulatory shock likely due to sepsis  P:  MAP goal > 65 Leveophed or neo for pressor need  CARDIAC STRUCTURAL A: Moderate AS in April 2021 with EF 55% and LVH  P: Clinically monitor    INFECTIOUS A:   SEvere sepsis from R Fournier gangrene, RT ischial ulcer and Rt  AKA re-infection  P:   Broad abx   RENAL A:  At risk for AKI P:  Fluids Avoid nephrotixins Maintain BP/HR  ELECTROLYTES A:  At risk for electrolyte imbalanc4e P: K goak > 4 Mag goal > 2 Phos goal - normal   GASTROINTESTINAL A:   AT risk stress ulcer Shock liver  P:   RUQ [place] Protoonix  HEMATOLOGIC   - HEME A:  Anemia of critical illness and chronic disease  07/14/2020 - s/p 1 unit PRBC   P:  - PRBC for  hgb </= 6.9gm%    - exceptions are   -  if ACS susepcted/confirmed then transfuse for hgb </= 8.0gm%,  or    -  active bleeding with hemodynamic instability, then transfuse regardless of hemoglobin value   At at all times try to transfuse 1 unit prbc as possible with exception of active hemorrhage   ENDOCRINE A:   At risk for hypo and hyperglycemia    P:   icu hypergluycemiua protocol  MSK/DERM Rt AKA wound infection RT sacral decub - large with nec fasc Rt Fournier gangrene  Plan  - ortho, ccs and urology consult   Best practice:  Diet: TF Pain/Anxiety/Delirium protocol (if indicated): sedation protocol VAP protocol (if indicated): Bundle + DVT prophylaxis: Lovenox/Heparin GI prophylaxis: ppi Glucose control: ssi Mobility: bed rest Code Status: full code Family Communication: Sister Abishai Viegas [phone]  -> updated 1:43 PM  Disposition: [facility]     ATTESTATION & SIGNATURE   The patient CAYMEN DUBRAY is critically ill with multiple organ systems failure and requires high complexity decision making for assessment and support, frequent evaluation and titration of therapies, application of advanced monitoring technologies and extensive interpretation of multiple databases.   Critical Care Time devoted to patient care services described in this note is  60  Minutes. This time reflects time of care of this signee Dr Brand Males. This critical care time does not reflect procedure time, or teaching time or supervisory time of  PA/NP/Med student/Med Resident etc but could involve care discussion time     Dr. Brand Males, M.D., [facility].C.P Pulmonary and Critical Care Medicine Staff Physician [facility] Pager: [phone], If no answer or between  15:00h - 7:00h: call [phone]  07/14/2020 1:08 PM     LABS    PULMONARY Recent Labs  Lab 06/28/2020 1850  PHART 7.463*  PCO2ART 35.8  PO2ART 103  HCO3 26.0  O2SAT 98.2    CBC Recent Labs  Lab 07/18/2020 1800 06/23/2020 0551 07/14/20 0817  HGB 7.3* 7.0* 6.7*  HCT 25.1* 24.6* 24.3*  WBC 31.5* 32.1* 22.9*  PLT 425* 394 355    COAGULATION Recent Labs  Lab 07/19/2020 1800  INR 1.3*    CARDIAC  No results for input(s): TROPONINI in the last 168 hours. No results for input(s): PROBNP in the last 168 hours.   CHEMISTRY Recent Labs  Lab 06/26/2020 1800 06/29/2020 1800 07/01/2020 1940 07/03/2020 0551 07/14/20 0817  NA 137  --   --  136 137  K 2.6*   < >  --  2.9* 3.5  CL 97*  --   --  102 106  CO2 26  --   --  23 22  GLUCOSE 213*  --   --  127* 139*  BUN 17  --   --  12 23  CREATININE 0.62  --   --  0.49* 0.61  CALCIUM 8.2*  --   --  7.8* 7.0*  MG  --   --  1.7  --   --    < > = values in this interval not displayed.   Estimated Creatinine Clearance: 99.5 mL/min (by C-G formula based on SCr of 0.61 mg/dL).   LIVER Recent Labs  Lab 07/11/2020 1800 07/14/20 0817  AST 28 1,151*  ALT 24 452*  ALKPHOS 117 95  BILITOT 0.9 0.6  PROT 5.7* 4.3*  ALBUMIN 2.1* 1.4*  INR 1.3*  --  INFECTIOUS Recent Labs  Lab 07/21/2020 1802 07/11/2020 2005 07/11/2020 2209  LATICACIDVEN 1.4 2.1* 1.7     ENDOCRINE CBG (last 3)  Recent Labs    07/14/20 0007 07/14/20 0419 07/14/20 0759  GLUCAP 150* 130* 173*         IMAGING x48h  - image(s) personally visualized  -   highlighted in bold DG Sacrum/Coccyx  Result Date: 07/14/2020 CLINICAL DATA:  Decubitus ulcer.  Evaluate for osteomyelitis.  EXAM: SACRUM AND COCCYX - 2+ VIEW COMPARISON:  None. FINDINGS: No acute bony abnormality identified. No radiographic evidence of osteomyelitis identified. Gas in the soft tissues is noted overlying the LOWER RIGHT pelvis on the frontal view. IMPRESSION: 1. No acute bony abnormality. No definite evidence of acute osteomyelitis. 2. Gas in the soft tissues overlying the LOWER RIGHT pelvis which may represent this patient's decubitus ulcer but correlate clinically. Electronically Signed   By: Margarette Canada M.D.   On: 06/26/2020 20:19   CT PELVIS W CONTRAST  Result Date: 06/22/2020 CLINICAL DATA:  Sacral ulcer EXAM: CT PELVIS WITH CONTRAST TECHNIQUE: Multidetector CT imaging of the pelvis was performed using the standard protocol following the bolus administration of intravenous contrast. CONTRAST:  176m OMNIPAQUE IOHEXOL 300 MG/ML  SOLN COMPARISON:  None. FINDINGS: Urinary Tract:  No abnormality visualized. Bowel:  Unremarkable visualized pelvic bowel loops. 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 Large amount of calcific atherosclerosis. Reproductive:  No mass or other significant abnormality Other: There is a right sacro-ischial decubitus ulcer with gas dissecting along the right perineal soft tissues into the right retroperitoneum and right inguinal canal. No fluid collection. Musculoskeletal: No suspicious bone lesions identified. IMPRESSION: 1. Right sacro-ischial decubitus ulcer with gas dissecting along the right perineal soft tissues into the right retroperitoneum and right inguinal canal. No fluid collection. 2. 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 Recommend follow-up every 6 months and vascular consultation. This recommendation follows ACR consensus guidelines: White Paper of the ACR Incidental Findings Committee II on Vascular Findings. J Am Coll Radiol 2013;; 30:[id number] 3. Aortic  Atherosclerosis (ICD10-I70.0). Electronically Signed   By: KUlyses JarredM.D.   On: 06/26/2020 23:22   DG CHEST PORT 1 VIEW  Result Date: 06/30/2020 CLINICAL DATA:  Respiratory failure EXAM: PORTABLE CHEST 1 VIEW COMPARISON:  07/02/2020 FINDINGS: Endotracheal tube and gastric catheter are now seen in satisfactory position. Cardiac shadow is stable. Lungs are well aerated bilaterally. No focal infiltrate or sizable effusion is seen. No pneumothorax is noted. No bony abnormality is noted. IMPRESSION: Interval intubation.  No acute abnormality is noted. Electronically Signed   By: MInez CatalinaM.D.   On: 06/23/2020 17:24   DG Chest Port 1 View  Result Date: 07/11/2020 CLINICAL DATA:  Fever and sacral ulcer. EXAM: PORTABLE CHEST 1 VIEW COMPARISON:  The May 15, 2020 FINDINGS: There is no evidence of acute infiltrate, pleural effusion or pneumothorax. The heart size and mediastinal contours are within normal limits. There is mild calcification of the aortic arch and tortuosity of the descending thoracic aorta. The visualized skeletal structures are unremarkable. IMPRESSION: No active disease. Electronically Signed   By: TVirgina NorfolkM.D.   On: 07/11/2020 18:37   UKoreaEKG SITE RITE  Result Date: 07/14/2020 If Site Rite image not attached, placement could not be confirmed due to current cardiac rhythm.

## 2020-07-14 NOTE — Progress Notes (Signed)
[facility]  Attempted to see patient this AM prior to clinic but PICC RN doing procedure.  RN reports dakins groin dressing in place and did not need changing overnight due to soilage. Was unable to change this AM due to patient being with the PICC RN.  Patient only about to get a bed at [facility]. I called [facility] yesterday [facility] but will let the [facility] team know that the patient is coming down. Have paged out now.   Dr. Alyson Ingles had been notified yesterday and Dr. Lovena Neighbours from Urology, but neither were available to see the patient.   Curlene Labrum, MD [facility] [address] [place], [place] [address] [phone] (office)

## 2020-07-14 NOTE — Addendum Note (Signed)
Addendum  created 07/14/20 1049 by Ollen Bowl, CRNA   Intraprocedure Event edited, Intraprocedure Staff edited

## 2020-07-14 NOTE — Progress Notes (Signed)
Patient seen and examined at the bedside.  He has just been transferred from [facility].  He was evaluated by Dr. Alyson Ingles earlier today who works in our [employer].  Please see his note for full details.  Briefly, he underwent excisional debridement by general surgery at [facility] for buttock, inguinal and perineal infection.  He was found to have Fournier's gangrene and the right hemiscrotum was excised exposing his right testicle.  He currently has Dakin's soaked Kerlix applied to the wound.  He is intubated and in the ICU.  On examination, overall, the wound appears clean and with viable tissue.  I did not see any obvious areas of necrosis but I did not fully take down the wound given that it was evaluated by Dr. Alyson Ingles only a couple hours prior.  Right testicle appeared viable.  Assessment: Fournier's gangrene  Plan: He has been evaluated by general surgery.  Tentative plan is to go back to the operating room tomorrow for further incision and debridement of necrotizing fasciitis.  Urology on call physician will be available as needed for the procedure.  I will notify Dr. Diona Fanti who is on call tomorrow.

## 2020-07-14 NOTE — Consult Note (Signed)
ORTHOPAEDIC CONSULTATION  REQUESTING PHYSICIAN: Brand Males, MD  Chief Complaint: Slight dehiscence right above-knee amputation.  HPI: Omar Todd is a 69 y.o. male who presents with multiple medical problems including gangrenous changes in the groin region with wound breakdown of the above-the-knee amputation.  Patient is approximately 2 months status post his right AKA.  Past Medical History:  Diagnosis Date  . COPD (chronic obstructive pulmonary disease) ([place])   . Diabetes mellitus without complication ([place])   . Gangrene of toe of right foot ([place])   . GERD (gastroesophageal reflux disease)   . Hyperlipidemia   . Hypertension    Past Surgical History:  Procedure Laterality Date  . AMPUTATION Right 05/17/2020   Procedure: AMPUTATION BELOW RIGHT KNEE;  Surgeon: Newt Minion, MD;  Location: [place];  Service: Orthopedics;  Laterality: Right;  . AMPUTATION Right 06/19/2020   Procedure: RIGHT ABOVE KNEE AMPUTATION;  Surgeon: Newt Minion, MD;  Location: [place];  Service: Orthopedics;  Laterality: Right;  . APPLICATION OF WOUND VAC Right 05/17/2020   Procedure: APPLICATION OF WOUND VAC;  Surgeon: Newt Minion, MD;  Location: [place];  Service: Orthopedics;  Laterality: Right;  . BIOPSY  03/13/2020   Procedure: BIOPSY;  Surgeon: Daneil Dolin, MD;  [facility];  Service: Endoscopy;;  gastric biopsy  . ESOPHAGOGASTRODUODENOSCOPY N/A 03/13/2020   Procedure: ESOPHAGOGASTRODUODENOSCOPY (EGD);  Surgeon: Daneil Dolin, MD;  [facility];  Service: Endoscopy;  Laterality: N/A;  . FLEXIBLE SIGMOIDOSCOPY N/A 05/06/2020   Procedure: FLEXIBLE SIGMOIDOSCOPY;  Surgeon: Daneil Dolin, MD;  [facility];  Service: Endoscopy;  Laterality: N/A;  . Thumb surgery     Social History   Socioeconomic History  . Marital status: Married    Spouse name: [name]  . Number of children: 3  . Years of education: 9th Grade  . Highest education level: 9th grade    Occupational History  . Occupation: Retired    Comment: Science writer  Tobacco Use  . Smoking status: Current Every Day Smoker    Packs/day: 0.50    Years: 60.00    Pack years: 30.00    Types: Cigarettes  . Smokeless tobacco: Never Used  Vaping Use  . Vaping Use: Former  Substance and Sexual Activity  . Alcohol use: No    Alcohol/week: 0.0 standard drinks  . Drug use: No  . Sexual activity: Yes    Birth control/protection: None  Other Topics Concern  . Not on file  Social History Narrative  . Not on file   Social Determinants of Health   Financial Resource Strain: Low Risk   . Difficulty of Paying Living Expenses: Not very hard  Food Insecurity: No Food Insecurity  . Worried About Charity fundraiser in the Last Year: Never true  . Ran Out of Food in the Last Year: Never true  Transportation Needs: No Transportation Needs  . Lack of Transportation (Medical): No  . Lack of Transportation (Non-Medical): No  Physical Activity: Inactive  . Days of Exercise per Week: 0 days  . Minutes of Exercise per Session: 0 min  Stress: No Stress Concern Present  . Feeling of Stress : Only a little  Social Connections: Socially Integrated  . Frequency of Communication with Friends and Family: More than three times a week  . Frequency of Social Gatherings with Friends and Family: More than three times a week  . Attends Religious Services: More than 4 times  per year  . Active Member of Clubs or Organizations: Yes  . Attends Archivist Meetings: More than 4 times per year  . Marital Status: Married   Family History  Problem Relation Age of Onset  . Cancer Father   . Diabetes Sister   . Stroke Brother   . Healthy Sister   . Healthy Sister   . Colon cancer Neg Hx   . Gastric cancer Neg Hx    - negative except otherwise stated in the family history section Allergies  Allergen Reactions  . Penicillins Other (See Comments)    Pt. States he "passed out"   Prior  to Admission medications   Medication Sig Start Date End Date Taking? Authorizing Provider  amLODipine (NORVASC) 10 MG tablet Take 1 tablet (10 mg total) by mouth daily. 03/14/20   Roxan Hockey, MD  aspirin EC 81 MG tablet Take 1 tablet (81 mg total) by mouth daily with breakfast. 03/14/20   Emokpae, Courage, MD  BIOTIN 5000 PO Take 5,000 mcg by mouth daily.     [provider]  cholecalciferol (VITAMIN D) 1000 UNITS tablet Take 1,000 Units by mouth daily.    [provider]  docusate sodium (COLACE) 100 MG capsule Take 1 capsule (100 mg total) by mouth 2 (two) times daily. 06/22/20   Pokhrel, Corrie Mckusick, MD  empagliflozin (JARDIANCE) 25 MG TABS tablet Take 25 mg by mouth daily before breakfast. 03/14/20   Emokpae, Courage, MD  escitalopram (LEXAPRO) 10 MG tablet Take 1 tablet (10 mg total) by mouth daily. 03/14/20   Roxan Hockey, MD  ferrous sulfate 325 (65 FE) MG tablet Take 1 tablet (325 mg total) by mouth daily with breakfast. 03/14/20   Denton Brick, Courage, MD  gabapentin (NEURONTIN) 300 MG capsule Take 1 capsule (300 mg total) by mouth 3 (three) times daily. 06/22/20   Pokhrel, Corrie Mckusick, MD  hydrochlorothiazide (HYDRODIURIL) 25 MG tablet Take 1 tablet (25 mg total) by mouth daily. 03/14/20   Roxan Hockey, MD  lisinopril (ZESTRIL) 40 MG tablet TAKE ONE (1) TABLET EACH DAY Patient taking differently: Take 40 mg by mouth daily.  03/14/20   Roxan Hockey, MD  metFORMIN (GLUCOPHAGE) 1000 MG tablet Take 1 tablet (1,000 mg total) by mouth 2 (two) times daily with a meal. 03/14/20   Emokpae, Courage, MD  methocarbamol (ROBAXIN) 500 MG tablet Take 1 tablet (500 mg total) by mouth every 6 (six) hours as needed for muscle spasms. 06/22/20   Pokhrel, Corrie Mckusick, MD  metoprolol tartrate (LOPRESSOR) 25 MG tablet Take 1 tablet (25 mg total) by mouth 2 (two) times daily. 03/14/20   Emokpae, Courage, MD  montelukast (SINGULAIR) 10 MG tablet TAKE ONE TABLET DAILY AT BEDTIME Patient taking differently:  Take 10 mg by mouth at bedtime.  01/07/20   [name], DO  oxyCODONE (ROXICODONE) 15 MG immediate release tablet Take 15 mg by mouth every 4 (four) hours as needed for pain.  06/03/20   [provider]  pantoprazole (PROTONIX) 40 MG tablet Take 1 tablet (40 mg total) by mouth daily. 03/15/20   Roxan Hockey, MD  pravastatin (PRAVACHOL) 40 MG tablet Take 1 tablet (40 mg total) by mouth every evening. 03/14/20   Emokpae, Courage, MD  [name] (JANUVIA) 100 MG tablet Take 1 tablet (100 mg total) by mouth daily. 03/14/20   Roxan Hockey, MD   DG Sacrum/Coccyx  Result Date: 07/05/2020 CLINICAL DATA:  Decubitus ulcer.  Evaluate for osteomyelitis. EXAM: SACRUM AND COCCYX - 2+ VIEW COMPARISON:  None. FINDINGS: No acute bony abnormality identified. No radiographic evidence of osteomyelitis identified. Gas in the soft tissues is noted overlying the LOWER RIGHT pelvis on the frontal view. IMPRESSION: 1. No acute bony abnormality. No definite evidence of acute osteomyelitis. 2. Gas in the soft tissues overlying the LOWER RIGHT pelvis which may represent this patient's decubitus ulcer but correlate clinically. Electronically Signed   By: Margarette Canada M.D.   On: 07/06/2020 20:19   CT PELVIS W CONTRAST  Result Date: 07/02/2020 CLINICAL DATA:  Sacral ulcer EXAM: CT PELVIS WITH CONTRAST TECHNIQUE: Multidetector CT imaging of the pelvis was performed using the standard protocol following the bolus administration of intravenous contrast. CONTRAST:  126mL OMNIPAQUE IOHEXOL 300 MG/ML  SOLN COMPARISON:  None. FINDINGS: Urinary Tract:  No abnormality visualized. Bowel:  Unremarkable visualized pelvic bowel loops. 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 Large amount of calcific atherosclerosis. Reproductive:  No mass or other significant abnormality Other: There is a right sacro-ischial decubitus ulcer with gas dissecting along the right  perineal soft tissues into the right retroperitoneum and right inguinal canal. No fluid collection. Musculoskeletal: No suspicious bone lesions identified. IMPRESSION: 1. Right sacro-ischial decubitus ulcer with gas dissecting along the right perineal soft tissues into the right retroperitoneum and right inguinal canal. No fluid collection. 2. 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 Recommend follow-up every 6 months and vascular consultation. This recommendation follows ACR consensus guidelines: White Paper of the ACR Incidental Findings Committee II on Vascular Findings. J Am Coll Radiol 2013; 83:662-947. 3. Aortic Atherosclerosis (ICD10-I70.0). Electronically Signed   By: Ulyses Jarred M.D.   On: 07/08/2020 23:22   DG CHEST PORT 1 VIEW  Result Date: 07/14/2020 CLINICAL DATA:  69 year old male with respiratory failure. Decubitus wound, sepsis. EXAM: PORTABLE CHEST 1 VIEW COMPARISON:  Portable chest 07/01/2020 and earlier. FINDINGS: Portable AP semi upright view at 1418 hours. Endotracheal tube tip in good position at the clavicles. Enteric tube courses to the stomach, side hole the level of the gastric fundus. Right upper extremity PICC line has been placed, tip is at the cavoatrial junction level. Mediastinal contours remain within normal limits. There is increased confluent retrocardiac opacity on the left suggesting lower lobe collapse or consolidation. No superimposed pneumothorax, pulmonary edema, or definite effusion. Allowing for portable technique the right lung remains clear. Visible bowel-gas pattern within normal limits. IMPRESSION: 1. New left lower lobe collapse or consolidation since yesterday. 2. Right upper extremity PICC line placed, tip at the cavoatrial junction level. 3. Otherwise stable lines and tubes. Electronically Signed   By: Genevie Ann M.D.   On: 07/14/2020 14:56   DG CHEST PORT 1 VIEW  Result Date: 07/17/2020 CLINICAL DATA:   Respiratory failure EXAM: PORTABLE CHEST 1 VIEW COMPARISON:  06/21/2020 FINDINGS: Endotracheal tube and gastric catheter are now seen in satisfactory position. Cardiac shadow is stable. Lungs are well aerated bilaterally. No focal infiltrate or sizable effusion is seen. No pneumothorax is noted. No bony abnormality is noted. IMPRESSION: Interval intubation.  No acute abnormality is noted. Electronically Signed   By: Inez Catalina M.D.   On: 06/22/2020 17:24   DG Chest Port 1 View  Result Date: 06/27/2020 CLINICAL DATA:  Fever and sacral ulcer. EXAM: PORTABLE CHEST 1 VIEW COMPARISON:  The May 15, 2020 FINDINGS: There is no evidence of acute infiltrate, pleural effusion or pneumothorax. The heart size and mediastinal contours are within normal limits. There is mild calcification  of the aortic arch and tortuosity of the descending thoracic aorta. The visualized skeletal structures are unremarkable. IMPRESSION: No active disease. Electronically Signed   By: Virgina Norfolk M.D.   On: 07/07/2020 18:37   [place] EKG SITE RITE  Result Date: 07/14/2020 If Site Rite image not attached, placement could not be confirmed due to current cardiac rhythm.  US Abdomen Limited RUQ  Result Date: 07/14/2020 CLINICAL DATA:  Elevated LFT EXAM: ULTRASOUND ABDOMEN LIMITED RIGHT UPPER QUADRANT COMPARISON:  None. FINDINGS: Gallbladder: Negative for gallstones. Mild sludge in the gallbladder. Gallbladder wall not significantly thickened. Patient is intubated and sedated therefore Murphy sign not considered accurate. Common bile duct: Diameter: 4.1 mm Liver: Increased echogenicity liver. No focal liver lesion. Portal vein is patent on color Doppler imaging with normal direction of blood flow towards the liver. Other: Limited study due to intubation. IMPRESSION: Increased echogenicity liver compatible with fatty infiltration. No liver mass. Mild gallbladder sludge.  No biliary dilatation. Electronically Signed   By: Franchot Gallo M.D.    On: 07/14/2020 14:28   - pertinent xrays, CT, MRI studies were reviewed and independently interpreted  Positive ROS: All other systems have been reviewed and were otherwise negative with the exception of those mentioned in the HPI and as above.  Physical Exam: General: Alert, no acute distress Psychiatric: Patient is competent for consent with normal mood and affect Lymphatic: No axillary or cervical lymphadenopathy Cardiovascular: No pedal edema Respiratory: No cyanosis, no use of accessory musculature GI: No organomegaly, abdomen is soft and non-tender    Images:  @ENCIMAGES @  Labs:  Lab Results  Component Value Date   HGBA1C 8.3 (H) 05/15/2020   HGBA1C 7.9 (H) 03/13/2020   HGBA1C 8.7 (H) 12/09/2019   ESRSEDRATE 54 (H) 03/13/2020   CRP 5.8 (H) 03/13/2020   REPTSTATUS PENDING 07/12/2020   REPTSTATUS PENDING 07/21/2020   REPTSTATUS PENDING 07/11/2020   GRAMSTAIN  06/30/2020    RARE WBC PRESENT, PREDOMINANTLY PMN ABUNDANT GRAM POSITIVE COCCI IN PAIRS MODERATE GRAM POSITIVE RODS FEW GRAM NEGATIVE RODS    GRAMSTAIN  06/27/2020    RARE WBC PRESENT, PREDOMINANTLY PMN MODERATE GRAM POSITIVE COCCI FEW GRAM POSITIVE RODS FEW GRAM NEGATIVE RODS    GRAMSTAIN  07/14/2020    ABUNDANT WBC PRESENT, PREDOMINANTLY PMN RARE GRAM POSITIVE COCCI    CULT  07/12/2020    CULTURE REINCUBATED FOR BETTER GROWTH Performed at [facility], [place] [address], [place], [place] [address]    CULT  07/05/2020    TOO YOUNG TO READ Performed at [facility], [place] [address] [place], [place] [address]    CULT  07/10/2020    CULTURE REINCUBATED FOR BETTER GROWTH Performed at [facility], [place] [name], [place],  [address]     Lab Results  Component Value Date   ALBUMIN 1.4 (L) 07/14/2020   ALBUMIN 2.1 (L) 07/20/2020   ALBUMIN 2.1 (L) 06/20/2020    Neurologic: Patient does not have protective sensation bilateral lower extremities.   MUSCULOSKELETAL:    Skin: Examination there is no cellulitis around the right AKA there is wound dehiscence and breakdown.  There is residual hematoma but no purulence.  Patient has an albumin of 1.4.  Assessment: Assessment: Wound breakdown right AKA with multiple medical problems currently on a ventilator with extensive gangrenous changes in the groin region.  Plan: I agree with packing the wound open for the right AKA and changing this daily.  Would not proceed with any type  of revision surgery until patient is medically stable.  I will follow-up as needed.  Thank you for the consult and the opportunity to see Omar Todd, [place] [phone] 5:41 PM

## 2020-07-14 NOTE — Progress Notes (Signed)
Peripherally Inserted Central Catheter Placement  The IV Nurse has discussed with the patient and/or persons authorized to consent for the patient, the purpose of this procedure and the potential benefits and risks involved with this procedure.  The benefits include less needle sticks, lab draws from the catheter, and the patient may be discharged home with the catheter. Risks include, but not limited to, infection, bleeding, blood clot (thrombus formation), and puncture of an artery; nerve damage and irregular heartbeat and possibility to perform a PICC exchange if needed/ordered by physician.  Alternatives to this procedure were also discussed.  Bard Power PICC patient education guide, fact sheet on infection prevention and patient information card has been provided to patient /or left at bedside.    PICC Placement Documentation  PICC Double Lumen 16/94/50 PICC Right Basilic 41 cm 0 cm (Active)  Indication for Insertion or Continuance of Line Vasoactive infusions 07/14/20 0900  Exposed Catheter (cm) 0 cm 07/14/20 0900  Site Assessment Clean;Dry;Intact 07/14/20 0900  Lumen #1 Status Flushed;Saline locked;Blood return noted 07/14/20 0900  Lumen #2 Status Flushed;Saline locked;Blood return noted 07/14/20 0900  Dressing Type Transparent;Securing device 07/14/20 0900  Dressing Status Clean;Dry;Intact;Antimicrobial disc in place 07/14/20 0900  Dressing Change Due 07/21/20 07/14/20 0900       Holley Bouche Renee 07/14/2020, 9:48 AM

## 2020-07-14 NOTE — Progress Notes (Addendum)
Received patient from [facility]. Patient placed on previous vent settings from [facility]; 650/15/+5/40%. ETT secure @24  (8.0).

## 2020-07-15 ENCOUNTER — Inpatient Hospital Stay (HOSPITAL_COMMUNITY): Payer: Medicare Other | Admitting: Certified Registered"

## 2020-07-15 ENCOUNTER — Other Ambulatory Visit: Payer: Self-pay | Admitting: Urology

## 2020-07-15 ENCOUNTER — Inpatient Hospital Stay (HOSPITAL_COMMUNITY): Payer: Medicare Other

## 2020-07-15 ENCOUNTER — Encounter (HOSPITAL_COMMUNITY): Admission: EM | Disposition: E | Payer: Self-pay | Source: Home / Self Care | Attending: Pulmonary Disease

## 2020-07-15 ENCOUNTER — Encounter (HOSPITAL_COMMUNITY): Payer: Self-pay | Admitting: General Surgery

## 2020-07-15 HISTORY — PX: INCISION AND DRAINAGE ABSCESS: SHX5864

## 2020-07-15 LAB — CBC
HCT: 26.3 % — ABNORMAL LOW (ref 39.0–52.0)
Hemoglobin: 7.8 g/dL — ABNORMAL LOW (ref 13.0–17.0)
MCH: 24.3 pg — ABNORMAL LOW (ref 26.0–34.0)
MCHC: 29.7 g/dL — ABNORMAL LOW (ref 30.0–36.0)
MCV: 81.9 fL (ref 80.0–100.0)
Platelets: 276 10*3/uL (ref 150–400)
RBC: 3.21 MIL/uL — ABNORMAL LOW (ref 4.22–5.81)
RDW: 17.4 % — ABNORMAL HIGH (ref 11.5–15.5)
WBC: 17.9 10*3/uL — ABNORMAL HIGH (ref 4.0–10.5)
nRBC: 0.1 % (ref 0.0–0.2)

## 2020-07-15 LAB — COMPREHENSIVE METABOLIC PANEL
ALT: 324 U/L — ABNORMAL HIGH (ref 0–44)
AST: 409 U/L — ABNORMAL HIGH (ref 15–41)
Albumin: 1.4 g/dL — ABNORMAL LOW (ref 3.5–5.0)
Alkaline Phosphatase: 96 U/L (ref 38–126)
Anion gap: 8 (ref 5–15)
BUN: 25 mg/dL — ABNORMAL HIGH (ref 8–23)
CO2: 21 mmol/L — ABNORMAL LOW (ref 22–32)
Calcium: 7.2 mg/dL — ABNORMAL LOW (ref 8.9–10.3)
Chloride: 110 mmol/L (ref 98–111)
Creatinine, Ser: 0.63 mg/dL (ref 0.61–1.24)
GFR calc Af Amer: >60 mL/min (ref >60)
GFR calc non Af Amer: >60 mL/min (ref >60)
Glucose, Bld: 114 mg/dL — ABNORMAL HIGH (ref 70–99)
Potassium: 4.5 mmol/L (ref 3.5–5.1)
Sodium: 139 mmol/L (ref 135–145)
Total Bilirubin: 0.5 mg/dL (ref 0.3–1.2)
Total Protein: 4.4 g/dL — ABNORMAL LOW (ref 6.5–8.1)

## 2020-07-15 LAB — GLUCOSE, CAPILLARY
Glucose-Capillary: 108 mg/dL — ABNORMAL HIGH (ref 70–99)
Glucose-Capillary: 119 mg/dL — ABNORMAL HIGH (ref 70–99)
Glucose-Capillary: 133 mg/dL — ABNORMAL HIGH (ref 70–99)
Glucose-Capillary: 118 mg/dL — ABNORMAL HIGH (ref 70–99)
Glucose-Capillary: 153 mg/dL — ABNORMAL HIGH (ref 70–99)

## 2020-07-15 LAB — TRIGLYCERIDES: Triglycerides: 102 mg/dL (ref <150)

## 2020-07-15 LAB — SURGICAL PATHOLOGY

## 2020-07-15 IMAGING — DX DG CHEST 1V PORT
1 series · 1 of 1 positions shown · non-contrast
Comparison: [DATE].

CLINICAL DATA: Respiratory failure.

EXAM:
PORTABLE CHEST 1 VIEW

[chest ap]
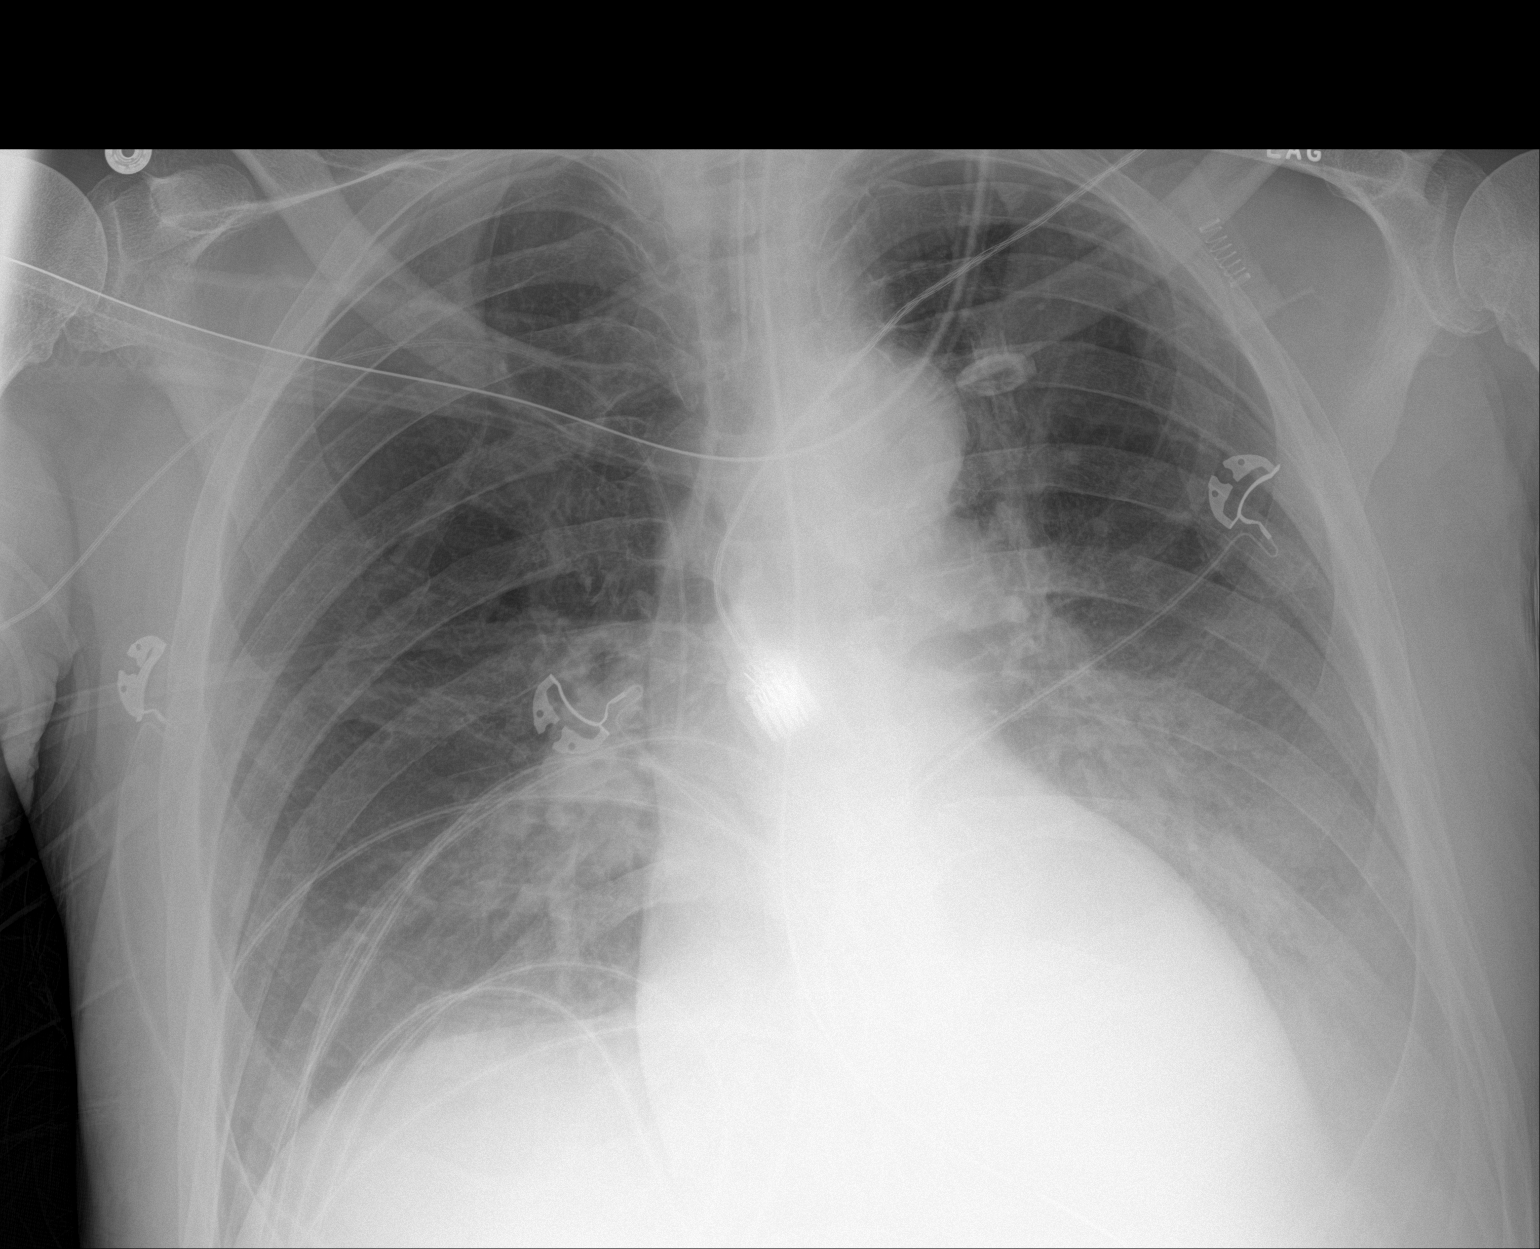

[1 of 1 positions shown; findings below may reference images not displayed]

FINDINGS: Stable cardiomediastinal silhouette. Endotracheal and nasogastric
tubes are unchanged in position. No pneumothorax is noted.
Right-sided PICC line is unchanged. Stable bibasilar opacities are
noted concerning for atelectasis or infiltrates, left greater than
right. Small left pleural effusion may be present. Bony thorax is
unremarkable.
IMPRESSION: Stable support apparatus. Stable bibasilar opacities are noted
concerning for atelectasis or infiltrates, left greater than right.

## 2020-07-15 SURGERY — INCISION AND DRAINAGE, ABSCESS
Anesthesia: General

## 2020-07-15 MED ORDER — FREE WATER
200.0000 mL | Freq: Three times a day (TID) | Status: DC
  Administered 2020-07-15 – 2020-07-18 (×9): 200 mL

## 2020-07-15 MED ORDER — ASCORBIC ACID 500 MG PO TABS
500.0000 mg | ORAL_TABLET | Freq: Two times a day (BID) | ORAL | Status: DC
  Administered 2020-07-15 – 2020-07-26 (×21): 500 mg
  Filled 2020-07-15 (×21): qty 1

## 2020-07-15 MED ORDER — GABAPENTIN 250 MG/5ML PO SOLN
300.0000 mg | Freq: Three times a day (TID) | ORAL | Status: DC
  Administered 2020-07-15 – 2020-07-18 (×8): 300 mg
  Filled 2020-07-15 (×12): qty 6

## 2020-07-15 MED ORDER — DOCUSATE SODIUM 50 MG/5ML PO LIQD
100.0000 mg | Freq: Two times a day (BID) | ORAL | Status: DC
  Administered 2020-07-15 – 2020-07-18 (×6): 100 mg
  Filled 2020-07-15 (×7): qty 10

## 2020-07-15 MED ORDER — ASCORBIC ACID 500 MG PO TABS: 500.0000 mg | ORAL_TABLET | Freq: Two times a day (BID) | ORAL | Status: DC

## 2020-07-15 MED ORDER — ZINC SULFATE 220 (50 ZN) MG PO CAPS
220.0000 mg | ORAL_CAPSULE | Freq: Every day | ORAL | Status: DC
  Administered 2020-07-15 – 2020-07-26 (×12): 220 mg
  Filled 2020-07-15 (×12): qty 1

## 2020-07-15 MED ORDER — ROCURONIUM BROMIDE 10 MG/ML (PF) SYRINGE
PREFILLED_SYRINGE | INTRAVENOUS | Status: DC | PRN
  Administered 2020-07-15: 40 mg via INTRAVENOUS

## 2020-07-15 MED ORDER — THIAMINE HCL 100 MG PO TABS
100.0000 mg | ORAL_TABLET | Freq: Every day | ORAL | Status: DC
  Administered 2020-07-15 – 2020-07-26 (×12): 100 mg
  Filled 2020-07-15 (×11): qty 1

## 2020-07-15 MED ORDER — PHENYLEPHRINE HCL-NACL 10-0.9 MG/250ML-% IV SOLN
INTRAVENOUS | Status: DC | PRN
  Administered 2020-07-15: 85 ug/min via INTRAVENOUS

## 2020-07-15 MED ORDER — SODIUM CHLORIDE 0.9 % IR SOLN
Status: DC | PRN
  Administered 2020-07-15: 3000 mL

## 2020-07-15 MED ORDER — FERROUS SULFATE 300 (60 FE) MG/5ML PO SYRP
300.0000 mg | ORAL_SOLUTION | Freq: Every day | ORAL | Status: DC
  Administered 2020-07-16 – 2020-07-26 (×11): 300 mg
  Filled 2020-07-15 (×13): qty 5

## 2020-07-15 MED ORDER — JUVEN PO PACK
1.0000 | PACK | Freq: Two times a day (BID) | ORAL | Status: DC
  Administered 2020-07-15 – 2020-07-26 (×23): 1
  Filled 2020-07-15 (×22): qty 1

## 2020-07-15 MED ORDER — PROSOURCE TF PO LIQD
90.0000 mL | Freq: Every day | ORAL | Status: DC
  Administered 2020-07-15 – 2020-07-26 (×53): 90 mL
  Filled 2020-07-15 (×45): qty 90

## 2020-07-15 MED ORDER — VITAL HIGH PROTEIN PO LIQD: 1000.0000 mL | ORAL | Status: DC

## 2020-07-15 MED ORDER — LACTATED RINGERS IV SOLN: INTRAVENOUS | Status: DC | PRN

## 2020-07-15 MED ORDER — FERROUS SULFATE 325 (65 FE) MG PO TABS: 325.0000 mg | ORAL_TABLET | Freq: Every day | ORAL | Status: DC

## 2020-07-15 MED ORDER — VITAL AF 1.2 CAL PO LIQD
1000.0000 mL | ORAL | Status: DC
  Administered 2020-07-15 – 2020-07-24 (×7): 1000 mL

## 2020-07-15 SURGICAL SUPPLY — 29 items
ADH SKN CLS APL DERMABOND .7 (GAUZE/BANDAGES/DRESSINGS) ×1
APL PRP STRL LF DISP 70% ISPRP (MISCELLANEOUS) ×1
BLADE SURG 15 STRL LF DISP TIS (BLADE) ×1 IMPLANT
BLADE SURG 15 STRL SS (BLADE) ×3
CHLORAPREP W/TINT 26 (MISCELLANEOUS) ×3 IMPLANT
COVER WAND RF STERILE (DRAPES) IMPLANT
DECANTER SPIKE VIAL GLASS SM (MISCELLANEOUS) IMPLANT
DERMABOND ADVANCED (GAUZE/BANDAGES/DRESSINGS) ×2
DERMABOND ADVANCED .7 DNX12 (GAUZE/BANDAGES/DRESSINGS) ×1 IMPLANT
DRAPE LAPAROSCOPIC ABDOMINAL (DRAPES) IMPLANT
DRSG PAD ABDOMINAL 8X10 ST (GAUZE/BANDAGES/DRESSINGS) IMPLANT
ELECT REM PT RETURN 15FT ADLT (MISCELLANEOUS) ×3 IMPLANT
GAUZE PACKING IODOFORM 1/4X15 (PACKING) IMPLANT
GAUZE SPONGE 4X4 12PLY STRL (GAUZE/BANDAGES/DRESSINGS) IMPLANT
GLOVE BIO SURGEON STRL SZ7.5 (GLOVE) ×3 IMPLANT
GOWN STRL REUS W/TWL XL LVL3 (GOWN DISPOSABLE) ×6 IMPLANT
KIT BASIN OR (CUSTOM PROCEDURE TRAY) ×3 IMPLANT
KIT TURNOVER KIT A (KITS) IMPLANT
PACK BASIC VI WITH GOWN DISP (CUSTOM PROCEDURE TRAY) ×3 IMPLANT
PENCIL SMOKE EVACUATOR (MISCELLANEOUS) IMPLANT
SPONGE LAP 18X18 RF (DISPOSABLE) ×3 IMPLANT
SUT MNCRL AB 4-0 PS2 18 (SUTURE) IMPLANT
SUT VIC AB 3-0 SH 27 (SUTURE)
SUT VIC AB 3-0 SH 27XBRD (SUTURE) IMPLANT
SWAB COLLECTION DEVICE MRSA (MISCELLANEOUS) IMPLANT
SWAB CULTURE ESWAB REG 1ML (MISCELLANEOUS) IMPLANT
TOWEL OR 17X26 10 PK STRL BLUE (TOWEL DISPOSABLE) ×3 IMPLANT
TOWEL OR NON WOVEN STRL DISP B (DISPOSABLE) ×3 IMPLANT
YANKAUER SUCT BULB TIP NO VENT (SUCTIONS) ×3 IMPLANT

## 2020-07-15 NOTE — Anesthesia Postprocedure Evaluation (Signed)
Anesthesia Post Note  Patient: Omar Todd  Procedure(s) Performed: INCISION AND DRAINAGE, IRRIGATION AND DEBRIDEMENT SOFT TISSUE INFECTION NECROTIZING FASCIITIS (N/A )     Patient location during evaluation: ICU Anesthesia Type: General Level of consciousness: patient remains intubated per anesthesia plan Pain management: pain level controlled Vital Signs Assessment: post-procedure vital signs reviewed and stable Respiratory status: spontaneous breathing Cardiovascular status: stable Postop Assessment: no apparent nausea or vomiting Anesthetic complications: no   No complications documented.  Last Vitals:  Vitals:   07/18/2020 1100 07/12/2020 1137  BP: (!) 113/54   Pulse: 64   Resp: 14   Temp:    SpO2: 100% 100%    Last Pain:  Vitals:   06/23/2020 0800  TempSrc: Oral  PainSc:                  Huston Foley

## 2020-07-15 NOTE — H&P (View-Only) (Signed)
2 Days Post-Op   Subjective/Chief Complaint: Omar Todd con't on vent support and pressor support   Objective: Vital signs in last 24 hours: Temp:  [97.8 F (36.6 C)-100.1 F (37.8 C)] 98.8 F (37.1 C) (08/25 0800) Pulse Rate:  [60-84] 70 (08/25 0630) Resp:  [10-17] 13 (08/25 0630) BP: (78-133)/(38-74) 105/54 (08/25 0630) SpO2:  [92 %-100 %] 97 % (08/25 0855) FiO2 (%):  [30 %-40 %] 30 % (08/25 0855) Weight:  [80.7 kg] 80.7 kg (08/24 1300) Last BM Date: 06/24/2020  Intake/Output from previous day: 08/24 0701 - 08/25 0700 In: 6361.3 [I.V.:5032.5; Blood:488.3; IV Piggyback:840.6] Out: 1050 [Urine:1050] Intake/Output this shift: No intake/output data recorded.   PE:  Constitutional: No acute distress, intubated, appears states age. Eyes: Anicteric sclerae, moist conjunctiva, no lid lag Lungs: Clear to auscultation bilaterally, normal respiratory effort CV: regular rate and rhythm, no murmurs, no peripheral edema, pedal pulses 2+ GI: Soft, no masses or hepatosplenomegaly, non-tender to palpation Skin: wound packed with kerlix Neuro: sedated, awakens and tracks   Lab Results:  Recent Labs    07/14/20 0817 06/21/2020 0355  WBC 22.9* 17.9*  HGB 6.7* 7.8*  HCT 24.3* 26.3*  PLT 355 276   BMET Recent Labs    07/14/20 1730 06/24/2020 0355  NA 139 139  K 3.2* 4.5  CL 111 110  CO2 22 21*  GLUCOSE 96 114*  BUN 23 25*  CREATININE 0.53* 0.63  CALCIUM 6.6* 7.2*   Omar Todd/INR Recent Labs    07/07/2020 1800  LABPROT 15.2  INR 1.3*   ABG Recent Labs    07/06/2020 1850 07/14/20 1413  PHART 7.463* 7.392  HCO3 26.0 23.4    Studies/Results: DG CHEST PORT 1 VIEW  Result Date: 07/19/2020 CLINICAL DATA:  Respiratory failure. EXAM: PORTABLE CHEST 1 VIEW COMPARISON:  July 14, 2020. FINDINGS: Stable cardiomediastinal silhouette. Endotracheal and nasogastric tubes are unchanged in position. No pneumothorax is noted. Right-sided PICC line is unchanged. Stable bibasilar opacities are noted  concerning for atelectasis or infiltrates, left greater than right. Small left pleural effusion may be present. Bony thorax is unremarkable. IMPRESSION: Stable support apparatus. Stable bibasilar opacities are noted concerning for atelectasis or infiltrates, left greater than right. Electronically Signed   By: Marijo Conception M.D.   On: 07/21/2020 08:09   DG CHEST PORT 1 VIEW  Result Date: 07/14/2020 CLINICAL DATA:  69 year old male with respiratory failure. Decubitus wound, sepsis. EXAM: PORTABLE CHEST 1 VIEW COMPARISON:  Portable chest 06/27/2020 and earlier. FINDINGS: Portable AP semi upright view at 1418 hours. Endotracheal tube tip in good position at the clavicles. Enteric tube courses to the stomach, side hole the level of the gastric fundus. Right upper extremity PICC line has been placed, tip is at the cavoatrial junction level. Mediastinal contours remain within normal limits. There is increased confluent retrocardiac opacity on the left suggesting lower lobe collapse or consolidation. No superimposed pneumothorax, pulmonary edema, or definite effusion. Allowing for portable technique the right lung remains clear. Visible bowel-gas pattern within normal limits. IMPRESSION: 1. New left lower lobe collapse or consolidation since yesterday. 2. Right upper extremity PICC line placed, tip at the cavoatrial junction level. 3. Otherwise stable lines and tubes. Electronically Signed   By: Genevie Ann M.D.   On: 07/14/2020 14:56   DG CHEST PORT 1 VIEW  Result Date: 06/24/2020 CLINICAL DATA:  Respiratory failure EXAM: PORTABLE CHEST 1 VIEW COMPARISON:  07/17/2020 FINDINGS: Endotracheal tube and gastric catheter are now seen in satisfactory position. Cardiac shadow is  stable. Lungs are well aerated bilaterally. No focal infiltrate or sizable effusion is seen. No pneumothorax is noted. No bony abnormality is noted. IMPRESSION: Interval intubation.  No acute abnormality is noted. Electronically Signed   By: Inez Catalina M.D.   On: 06/23/2020 17:24   [place] EKG SITE RITE  Result Date: 07/14/2020 If Site Rite image not attached, placement could not be confirmed due to current cardiac rhythm.  US Abdomen Limited RUQ  Result Date: 07/14/2020 CLINICAL DATA:  Elevated LFT EXAM: ULTRASOUND ABDOMEN LIMITED RIGHT UPPER QUADRANT COMPARISON:  None. FINDINGS: Gallbladder: Negative for gallstones. Mild sludge in the gallbladder. Gallbladder wall not significantly thickened. Patient is intubated and sedated therefore Murphy sign not considered accurate. Common bile duct: Diameter: 4.1 mm Liver: Increased echogenicity liver. No focal liver lesion. Portal vein is patent on color Doppler imaging with normal direction of blood flow towards the liver. Other: Limited study due to intubation. IMPRESSION: Increased echogenicity liver compatible with fatty infiltration. No liver mass. Mild gallbladder sludge.  No biliary dilatation. Electronically Signed   By: Franchot Gallo M.D.   On: 07/14/2020 14:28    Anti-infectives: Anti-infectives (From admission, onward)   Start     Dose/Rate Route Frequency Ordered Stop   06/26/2020 1500  clindamycin (CLEOCIN) IVPB 600 mg        600 mg 100 mL/hr over 30 Minutes Intravenous Every 6 hours 07/02/2020 1337     07/12/2020 0600  vancomycin (VANCOREADY) IVPB 750 mg/150 mL        750 mg 150 mL/hr over 60 Minutes Intravenous Every 8 hours 06/30/2020 2310     07/08/2020 0200  metroNIDAZOLE (FLAGYL) IVPB 500 mg  Status:  Discontinued        500 mg 100 mL/hr over 60 Minutes Intravenous Every 8 hours 07/17/2020 2220 07/07/2020 1337   07/11/2020 2315  ceFEPIme (MAXIPIME) 2 g in sodium chloride 0.9 % 100 mL IVPB        2 g 200 mL/hr over 30 Minutes Intravenous Every 8 hours 07/02/2020 2310     06/30/2020 2000  vancomycin (VANCOREADY) IVPB 1500 mg/300 mL        1,500 mg 150 mL/hr over 120 Minutes Intravenous  Once 07/15/2020 1834 06/21/2020 2326   07/07/2020 1800  vancomycin (VANCOCIN) IVPB 1000 mg/200 mL premix  Status:   Discontinued        1,000 mg 200 mL/hr over 60 Minutes Intravenous  Once 07/20/2020 1757 06/24/2020 1834   06/30/2020 1800  clindamycin (CLEOCIN) IVPB 600 mg        600 mg 100 mL/hr over 30 Minutes Intravenous  Once 07/20/2020 1759 06/28/2020 1913   07/08/2020 1800  ciprofloxacin (CIPRO) IVPB 400 mg        400 mg 200 mL/hr over 60 Minutes Intravenous  Once 06/26/2020 1759 07/02/2020 2039      Assessment/Plan: 52 M with nec fasciitis and fournier's gangrene. Patient Active Problem List   Diagnosis Date Noted  . Acute respiratory failure with hypoxia ([place])   . Dehiscence of amputation stump ([place])   . Necrotizing fasciitis ([place])   . Fournier's gangrene of scrotum   . Surgical wound infection   . Sepsis ([place]) 07/17/2020  . Right above-knee amputee ([place]) 06/24/2020  . Right BKA infection ([place]) 06/18/2020  . Hyperglycemia due to diabetes mellitus ([place]) 06/18/2020  . Thrombocytosis ([place]) 06/18/2020  . Right ischial pressure sore, unstageable ([place]) 05/16/2020  . Severe protein-calorie malnutrition ([place])   . Gangrene of right foot ([place]) 03/26/2020  .  PAD (peripheral artery disease) ([place]) 03/26/2020  . Normocytic anemia 03/12/2020  . GI bleed 03/12/2020  . AAA (abdominal aortic aneurysm) without rupture ([place]) 02/28/2020  . Critical lower limb ischemia 02/28/2020  . BPH (benign prostatic hyperplasia) 10/31/2014  . Systolic ejection murmur 55/11/5866  . Bilateral carotid bruits 10/31/2014  . Erectile dysfunction 11/05/2013  . Bilateral carotid artery disease ([place]) 04/11/2013  . Hyperlipidemia associated with type 2 diabetes mellitus ([place]) 04/11/2013  . Hypertension associated with diabetes ([place]) 04/11/2013  . COPD exacerbation ([place]) 04/11/2013  . Diabetes mellitus type 2 controlled 04/11/2013  . Diabetic neuropathy, type II diabetes mellitus ([place]) 03/15/2013    PLan:  To OR today for 2nd look and D&I COn't abx Appreciate CCM assistance   LOS: 3 days    Ralene Ok 07/06/2020

## 2020-07-15 NOTE — Progress Notes (Signed)
NAME:  Omar Todd, MRN:  [id number], DOB:  11/08/1951, LOS: 3 ADMISSION DATE:  07/11/2020, CONSULTATION DATE:  07/14/20 REFERRING MD:  [name] trnasfer, CHIEF COMPLAINT:  [name] at [facility] History    69 oack current smoker with DM, COPD Nos, BP, lipid, GERD. 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 At some pont also has stage 2 sacral decub "Size of a quarter"- described at time of this discharge. Discharged to ? Rehab and then signed out ? AMA from rehab.   Readimmited to [facility] 07/03/2020 with sepsis syndrome(fever + SIRS with lactate 2.1) with growing. Rt buttock ulcer noted to be stage 3 large wth fecal soiling but R AKA stump incision well healed but  By 07/01/2020 purulent drainage noted. CT pelvis c/w necrotizing fasciitis (with sub cut gas) in right perineal soft tissue and bedside exam was concerning for crepitus and concern for Fournier gangrene. Patient noticed to be disoriented by 06/22/2020  On 07/16/2020 Hervey Ard excisional debridement of right buttock, right inguinal region, and right scrotal skin measuring (right buttock / right inguinal wound 20cmX10cmX4cm and right scrotal skin 15cmX8cmX2cm); Drainage and packing of Right AKA wound infection  -. 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 Concern for need for repeated debridement and urology involvement and patient transferred to [facility] on 07/14/20. Upon arrival getting PRBC for hgb < 7gm% and sedatedh diprivan gtt and fent gtt on ventilator needing neo for bp support.    Decision maker:  he has a wife at [employer] with dementia and estranged children he has not seen since they were babies.Obryan Radu [phone]  She is his closest  relative and decision maker. She does not have health care power of attorney.    Past Medical History     has a past medical history of COPD (chronic obstructive pulmonary disease) ([place]), Diabetes mellitus without complication ([place]), Gangrene of toe of right foot ([place]), GERD (gastroesophageal reflux disease), Hyperlipidemia, and Hypertension.   reports that he has been smoking cigarettes. He has a 30.00 pack-year smoking history. He has never used smokeless tobacco.   has a past surgical history that includes Thumb surgery; Esophagogastroduodenoscopy (N/A, 03/13/2020); biopsy (03/13/2020); Flexible sigmoidoscopy (N/A, 05/06/2020); Amputation (Right, 05/17/2020); Application if wound vac (Right, 05/17/2020); Amputation (Right, 06/19/2020); Incision and drainage of wound (Right, 06/23/2020); and Incision and drainage (Right, 06/29/2020).   Significant Hospital Events   06/26/2020 -  admit 07/14/20  - transfer to [place]  Consults:  8/24 -   Procedures:  8/23 -  On 06/30/2020 Hervey Ard excisional debridement of right buttock, right inguinal region, and right scrotal skin measuring (right buttock / right inguinal wound 20cmX10cmX4cm and right scrotal skin 15cmX8cmX2cm); Drainage and packing of Right AKA wound infection ETT 823  RUE PIcc 8/24  Significant Diagnostic Tests:  x  Micro Data:   Microbiology results: 8/22 BCx:  8/22 UCx:   8/22 Wound cx   Antimicrobials:  Antimicrobials this admission: 8/22 Cipro x 1 8/22 Clinda x 1 8/22 Flagyl >> 8/22 Vanc >> 8/22 Cefepime>>   Interim history/subjective:  8/24 - going to OR today for debridement. On neo gtt, diprivan gtt, fent gtt . On  vent 40% . Not on TF (yet). Ortho Dr Sharol Given recmmended -> packing the  R AKA wound daily . Seen by Dr Gloriann Loan of Urology - currently R testicle exposed but viable tissue  Objective   Blood pressure (!) 111/49, pulse 66, temperature 98.8 F (37.1 C), temperature source Oral, resp. rate 16, height 6\' 2"   (1.88 m), weight 80.7 kg, SpO2 (!) 85 %.    Vent Mode: PRVC FiO2 (%):  [30 %-40 %] 40 % Set Rate:  [15 bmp] 15 bmp Vt Set:  [650 mL] 650 mL PEEP:  [5 cmH20] 5 cmH20 Plateau Pressure:  [12 cmH20-18 cmH20] 15 cmH20   Intake/Output Summary (Last 24 hours) at 06/27/2020 1050 Last data filed at 06/23/2020 0630 Gross per 24 hour  Intake 4407.51 ml  Output 850 ml  Net 3557.51 ml   Filed Weights   07/06/2020 1748 07/21/2020 1643 07/14/20 1300  Weight: 78 kg 75.4 kg 80.7 kg   General Appearance:  Looks criticall ill  Head:  Normocephalic, without obvious abnormality, atraumatic Eyes:  PERRL - yes, conjunctiva/corneas - muddy     Ears:  Normal external ear canals, both ears Nose:  G tube - no Throat:  ETT TUBE - yes , OG tube - yes Neck:  Supple,  No enlargement/tenderness/nodules Lungs: Clear to auscultation bilaterally, Ventilator   Synchrony - yes Heart:  S1 and S2 normal, no murmur, CVP - x.  Pressors - yes Abdomen:  Soft, no masses, no organomegaly Genitalia / Rectal:  Rt test wiuth packing and foley + Extremities:  Extremities- Rt AKA with stump bandad Skin:  ntact in exposed areas . Sacral area - has decub Neurologic:  Sedation - fent and diprivan -> RASS - -3 . Moves all 4s - yes. CAM-ICU - unable to test . Orientation - unable to test     Resolved Hospital Problem list   x  Assessment & Plan:  ASSESSMENT / PLAN:  PULMONARY  A:  Baseline COPD NOS Acute post op respiratory failure in setting of infected sacral ulcer and Fournier gangrene 07/19/2020.  07/21/2020 - > does not meet criteria for SBT/Extubation in setting of Acute Respiratory Failure due to circulatory shock and surgery needs   P:   BD PRVC VAP bundle Leave intubaed due to repeat needs to go to OR   NEUROLOGIC A:   Acute encephalopathy in setting of sepsis 07/01/2020 and pre-op. Left sedated post op on ventilator  06/29/2020 - WUA not started due to impending OR need  P:   RASS goal 0 to -2 Fent  gtt Diprivan gtt WUA after OR   VASCULAR A:   Circulatory shock likely due to sepsis  P:  MAP goal > 65 Leveophed or neo for pressor need  CARDIAC STRUCTURAL A: Moderate AS in April 2021 with EF 55% and LVH  P: Clinically monitor    INFECTIOUS A:   SEvere sepsis from R Fournier gangrene, RT ischial ulcer and Rt AKA re-infection  P:   Broad abx      RENAL A:  At risk for AKI P:  Fluids Avoid nephrotixins Maintain BP/HR  ELECTROLYTES A:  At risk for electrolyte imbalanc4e P: K goak > 4 Mag goal > 2 Phos goal - normal   GASTROINTESTINAL A:   AT risk stress ulcer Shock liver  P:   RUQ [place] Protoonix  HEMATOLOGIC   - HEME A:  Anemia of critical illness and chronic disease - s/p PRBC 07/14/20  07/17/2020 - stable  P:  - PRBC for hgb </= 6.9gm%    - exceptions are   -  if ACS susepcted/confirmed then transfuse for hgb </= 8.0gm%,  or    -  active bleeding with hemodynamic instability, then transfuse regardless of hemoglobin value   At at all times try to transfuse 1 unit prbc as possible with exception of active hemorrhage   ENDOCRINE A:   At risk for hypo and hyperglycemia    P:   icu hypergluycemiua protocol  MSK/DERM Rt AKA wound infection RT sacral decub - large with nec fasc Rt Fournier gangrene  Plan  - ortho, ccs and urology consult   Best practice:  Diet: TF Pain/Anxiety/Delirium protocol (if indicated): sedation protocol VAP protocol (if indicated): Bundle + DVT prophylaxis: Lovenox/Heparin GI prophylaxis: ppi Glucose control: ssi Mobility: bed rest Code Status: full code  Family Communication: Sister Draylen Lobue [phone]  -Disposition: [facility]      ATTESTATION & SIGNATURE   The patient NORWIN ALEMAN is critically ill with multiple organ systems failure and requires high complexity decision making for assessment and support, frequent evaluation and titration of therapies, application of advanced  monitoring technologies and extensive interpretation of multiple databases.   Critical Care Time devoted to patient care services described in this note is  31  Minutes. This time reflects time of care of this signee Dr Brand Males. This critical care time does not reflect procedure time, or teaching time or supervisory time of PA/NP/Med student/Med Resident etc but could involve care discussion time     Dr. Brand Males, M.D., [facility].C.P Pulmonary and Critical Care Medicine Staff Physician [place] [facility] Pager: [phone], If no answer or between  15:00h - 7:00h: call [phone]  07/21/2020 11:37 AM     LABS    PULMONARY Recent Labs  Lab 07/01/2020 1850 07/14/20 1413  PHART 7.463* 7.392  PCO2ART 35.8 39.3  PO2ART 103 96.8  HCO3 26.0 23.4  O2SAT 98.2 97.6    CBC Recent Labs  Lab 07/19/2020 0551 07/14/20 0817 07/03/2020 0355  HGB 7.0* 6.7* 7.8*  HCT 24.6* 24.3* 26.3*  WBC 32.1* 22.9* 17.9*  PLT 394 355 276    COAGULATION Recent Labs  Lab 07/02/2020 1800  INR 1.3*    CARDIAC  No results for input(s): TROPONINI in the last 168 hours. No results for input(s): PROBNP in the last 168 hours.   CHEMISTRY Recent Labs  Lab 07/21/2020 1800 07/16/2020 1800 07/06/2020 1940 06/25/2020 0551 07/21/2020 0551 07/14/20 0817 07/14/20 0817 07/14/20 1730 06/29/2020 0355  NA 137  --   --  136  --  137  --  139 139  K 2.6*   < >  --  2.9*   < > 3.5   < > 3.2* 4.5  CL 97*  --   --  102  --  106  --  111 110  CO2 26  --   --  23  --  22  --  22 21*  GLUCOSE 213*  --   --  127*  --  139*  --  96 114*  BUN 17  --   --  12  --  23  --  23 25*  CREATININE 0.62  --   --  0.49*  --  0.61  --  0.53* 0.63  CALCIUM 8.2*  --   --  7.8*  --  7.0*  --  6.6* 7.2*  MG  --   --  1.7  --   --   --   --   --   --    < > = values in this interval not displayed.   Estimated Creatinine Clearance: 99.5 mL/min (by C-G formula based on SCr of 0.63  mg/dL).   LIVER Recent Labs  Lab 06/28/2020 1800 07/14/20 0817 07/14/20 1730 06/24/2020 0355  AST 28 1,151* 660* 409*  ALT 24 452* 359* 324*  ALKPHOS 117 95 85 96  BILITOT 0.9 0.6 0.6 0.5  PROT 5.7* 4.3* 3.9* 4.4*  ALBUMIN 2.1* 1.4* 1.3* 1.4*  INR 1.3*  --   --   --      INFECTIOUS Recent Labs  Lab 07/18/2020 1802 06/22/2020 2005 06/21/2020 2209  LATICACIDVEN 1.4 2.1* 1.7     ENDOCRINE CBG (last 3)  Recent Labs    07/14/20 2325 06/22/2020 0347 07/08/2020 0803  GLUCAP 95 108* 119*         IMAGING x48h  - image(s) personally visualized  -   highlighted in bold DG CHEST PORT 1 VIEW  Result Date: 06/29/2020 CLINICAL DATA:  Respiratory failure. EXAM: PORTABLE CHEST 1 VIEW COMPARISON:  July 14, 2020. FINDINGS: Stable cardiomediastinal silhouette. Endotracheal and nasogastric tubes are unchanged in position. No pneumothorax is noted. Right-sided PICC line is unchanged. Stable bibasilar opacities are noted concerning for atelectasis or infiltrates, left greater than right. Small left pleural effusion may be present. Bony thorax is unremarkable. IMPRESSION: Stable support apparatus. Stable bibasilar opacities are noted concerning for atelectasis or infiltrates, left greater than right. Electronically Signed   By: Marijo Conception M.D.   On: 07/14/2020 08:09   DG CHEST PORT 1 VIEW  Result Date: 07/14/2020 CLINICAL DATA:  69 year old male with respiratory failure. Decubitus wound, sepsis. EXAM: PORTABLE CHEST 1 VIEW COMPARISON:  Portable chest 07/01/2020 and earlier. FINDINGS: Portable AP semi upright view at 1418 hours. Endotracheal tube tip in good position at the clavicles. Enteric tube courses to the stomach, side hole the level of the gastric fundus. Right upper extremity PICC line has been placed, tip is at the cavoatrial junction level. Mediastinal contours remain within normal limits. There is increased confluent retrocardiac opacity on the left suggesting lower lobe collapse or  consolidation. No superimposed pneumothorax, pulmonary edema, or definite effusion. Allowing for portable technique the right lung remains clear. Visible bowel-gas pattern within normal limits. IMPRESSION: 1. New left lower lobe collapse or consolidation since yesterday. 2. Right upper extremity PICC line placed, tip at the cavoatrial junction level. 3. Otherwise stable lines and tubes. Electronically Signed   By: Genevie Ann M.D.   On: 07/14/2020 14:56   DG CHEST PORT 1 VIEW  Result Date: 07/11/2020 CLINICAL DATA:  Respiratory failure EXAM: PORTABLE CHEST 1 VIEW COMPARISON:  07/18/2020 FINDINGS: Endotracheal tube and gastric catheter are now seen in satisfactory position. Cardiac shadow is stable. Lungs are well aerated bilaterally. No focal infiltrate or sizable effusion is seen. No pneumothorax is noted. No bony abnormality is noted. IMPRESSION: Interval intubation.  No acute abnormality is noted. Electronically Signed   By: Inez Catalina M.D.   On: 07/03/2020 17:24   [place] EKG SITE RITE  Result Date: 07/14/2020 If Site Rite image not attached, placement could not be confirmed due to current cardiac rhythm.  US Abdomen Limited RUQ  Result Date: 07/14/2020 CLINICAL DATA:  Elevated LFT EXAM: ULTRASOUND ABDOMEN LIMITED RIGHT UPPER QUADRANT COMPARISON:  None. FINDINGS: Gallbladder: Negative for gallstones.  Mild sludge in the gallbladder. Gallbladder wall not significantly thickened. Patient is intubated and sedated therefore Murphy sign not considered accurate. Common bile duct: Diameter: 4.1 mm Liver: Increased echogenicity liver. No focal liver lesion. Portal vein is patent on color Doppler imaging with normal direction of blood flow towards the liver. Other: Limited study due to intubation. IMPRESSION: Increased echogenicity liver compatible with fatty infiltration. No liver mass. Mild gallbladder sludge.  No biliary dilatation. Electronically Signed   By: Franchot Gallo M.D.   On: 07/14/2020 14:28

## 2020-07-15 NOTE — Progress Notes (Signed)
Initial Nutrition Assessment  DOCUMENTATION CODES:   Not applicable  INTERVENTION:  - will order Vital AF 1.2 @ 50 ml/hr with 90 ml Prosource TF x5/day (10 packets/day), juven BID, and 200 ml free water TID. - this regimen + kcal from current propofol rate will provide 2278 kcal (112% estimated kcal need), 205 grams protein, and 1573 ml free water.  - recommend check serum vitamin A and serum vitamin D.  - recommend 100 mg IV thiamine/day, 325 mg ferrous sulfate/day, 500 mg ascorbic acid BID, and 220 mg zinc sulfate/day.   NUTRITION DIAGNOSIS:   Increased nutrient needs related to acute illness as evidenced by estimated needs.  GOAL:   Patient will meet greater than or equal to 90% of their needs  MONITOR:   Vent status, Labs, Weight trends  REASON FOR ASSESSMENT:   Ventilator  ASSESSMENT:   69 year-old male with medical history of ongoing smoking, DM, COPD, hyperlipidemia, and GERD. 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 He underwent revision on 7/30. He was discharged from the hospital to rehab and left there AMA. He was admitted to [facility] on 8/22 was sepsis, SIRS, and noted to have stage 3 wound to R buttocks with fecal contamination. CT pelvis was consistent with necrotizing fasciitis.  Patient is POD #1 I&D of R buttocks, R inguinal region, and R scrotal area. Patient remains intubated post-op with OGT in place (gastric) and possible plan for return to OR today.   No family or visitors at bedside. Weight yesterday was 178 lb and weight on 8/22 was 172 lb. Weight on 8/22 was more consistent with weight since 05/16/20. Suspect increase d/t fluid given noted moderate edema to LLE.   Per notes: - necrotizing fascitis/Fournier's gangrene of R hemiscrotum, R buttocks, R inguinal region, and R scrotum - infection to R AKA incisional area   Patient is currently intubated on ventilator support MV: 11.4  L/min Temp (24hrs), Avg:98.8 F (37.1 C), Min:97.8 F (36.6 C), Max:100.1 F (37.8 C) Propofol: 9.4 ml/hr (248 kcal) BP: 111/49 and MAP: 68  Labs reviewed; CBGs: 108 and 119 mg/dl, BUN: 25 mg/dl, Ca: 7.2 mg/dl, LFTs elevated.  Medications reviewed; 100 mg colace BID, sliding scale novolog, 40 mg IV protonix/day, 17 g miralax/day, 10 mEq IV KCl x4 runs 8/24, 20 mEq KCl per tube x2 doses 8/24.  IVF; LR @ 100 ml/hr. Drips; propofol @ 20 mcg/kg/min, fentanyl @ 125 mcg/hr, neo @ 80 mcg/min.     NUTRITION - FOCUSED PHYSICAL EXAM:    Most Recent Value  Orbital Region No depletion  Upper Arm Region No depletion  Thoracic and Lumbar Region Unable to assess  Buccal Region No depletion  Temple Region No depletion  Clavicle Bone Region Mild depletion  Clavicle and Acromion Bone Region No depletion  Scapular Bone Region Mild depletion  Dorsal Hand Unable to assess  [bilateral mittens]  Patellar Region No depletion  Anterior Thigh Region No depletion  Posterior Calf Region No depletion  Edema (RD Assessment) Moderate  [LLE]  Hair Reviewed  Eyes Unable to assess  Mouth Unable to assess  Skin Reviewed  [many scabs to LLE]  Nails Unable to assess       Diet Order:   Diet Order            Diet NPO time specified  Diet effective now  EDUCATION NEEDS:   No education needs have been identified at this time  Skin:  Skin Assessment: Skin Integrity Issues: Skin Integrity Issues:: Unstageable Unstageable: full thickness to R buttocks and sacrum  Last BM:  8/23  Height:   Ht Readings from Last 1 Encounters:  07/14/20 6\' 2"  (1.88 m)    Weight:   Wt Readings from Last 1 Encounters:  07/14/20 80.7 kg     Estimated Nutritional Needs:  Kcal:  2035 kcal Protein:  177-202 grams (2.2-2.5 grams/kg) Fluid:  >/= 2.5 L/day     Jarome Matin, MS, RD, LDN, CNSC Inpatient Clinical Dietitian RD pager # available in [facility]  After hours/weekend pager # available  in [facility]

## 2020-07-15 NOTE — Anesthesia Preprocedure Evaluation (Signed)
Anesthesia Evaluation  Patient identified by MRN, date of birth, ID band Patient unresponsive    Airway Mallampati: Intubated       Dental  (+) Poor Dentition   Pulmonary Current Smoker,    Pulmonary exam normal        Cardiovascular hypertension, Normal cardiovascular exam Rhythm:Regular Rate:Normal     Neuro/Psych    GI/Hepatic   Endo/Other  diabetes  Renal/GU      Musculoskeletal   Abdominal Normal abdominal exam  (+)   Peds  Hematology   Anesthesia Other Findings   Reproductive/Obstetrics                             Anesthesia Physical Anesthesia Plan  ASA: III  Anesthesia Plan: General   Post-op Pain Management:    Induction: Inhalational  PONV Risk Score and Plan:   Airway Management Planned: Oral ETT  Additional Equipment: None  Intra-op Plan:   Post-operative Plan: Post-operative intubation/ventilation  Informed Consent:   Plan Discussed with: CRNA  Anesthesia Plan Comments:         Anesthesia Quick Evaluation

## 2020-07-15 NOTE — Op Note (Signed)
Preoperative diagnosis:  Necrotizing fasciitis with initial wide debridement 2 days ago  Postoperative diagnosis:  Same   Principal procedure:  Inspection of wound, debridement of right testicle/cord, scrotal wound edge ( excised tissue approximately 2 x 4 cm)  Surgeon:  Alexiz Cothran  Anesthesia:  GET  Specimen: None  Complication: None  Estimated blood loss:  Less than 30 mL   Indications:  69 year old male with necrotizing fasciitis of his right buttock/perineum/ scrotum, initial debridement by Dr. Constance Haw 2 days ago.  He comes in for 2nd look.  General surgery/ Dr. Rosendo Gros is also involved in this case.    Findings:  Generally, the open scrotal wound and testicle/ cord are fairly clean.  There is a significant amount of fibrinous exudate and 1-2 areas of dusky skin on resected areas.  There is no process that looks like it is progressing.  Description of procedure:  The patient was identified and brought to the operating room.  He was placed in the dorsal lithotomy position.  His catheter was retracted anteriorly.  His wound as well as abdomen and perineum were prepped and draped, time-out was performed.    Dr.  Rosendo Gros  Performed inspection and debridement of most of the wound, specifically the buttock and perineal area.  Following this, I carefully inspected the patient's scrotal wound edges as well as inguinal region.  Several dusky skin areas were resected using electric cautery.  Fibrinous material overlying the testicle and cord were removed down to healthy tissue.  Several other areas of fibrinous tissue were excised.  Careful  Inspection and cauterization was used to control all small bleeding areas.  At this point, the wounds were all irrigated copiously and wet to dry dressing placed.  Procedure was then terminated.  The patient was awakened and taken to the ICU.

## 2020-07-15 NOTE — Op Note (Signed)
07/12/2020  12:05 PM  PATIENT:  Omar Todd  69 y.o. male  PRE-OPERATIVE DIAGNOSIS:  NECROTIZING FASCIITIS  POST-OPERATIVE DIAGNOSIS:  NECROTIZING FASCIITIS  PROCEDURE:  Procedure(s): INCISION AND DRAINAGE, IRRIGATION AND DEBRIDEMENT SOFT TISSUE INFECTION NECROTIZING FASCIITIS (N/A)  SURGEON:  Surgeon(s) and Role:    * Ralene Ok, MD - Primary    * Franchot Gallo, MD - Assisting  ANESTHESIA:   general  EBL:  minimal   BLOOD ADMINISTERED:none  DRAINS: none   LOCAL MEDICATIONS USED:  BUPIVICAINE   SPECIMEN:  No Specimen  DISPOSITION OF SPECIMEN:  N/A  COUNTS:  YES  TOURNIQUET:  * No tourniquets in log *  DICTATION: .Dragon Dictation Patient is a 69 year old male who comes in secondary to necrotizing fasciitis.  Patient underwent initial debridement in outside hospital.  Findings: Patient had a area in the right ischial rectal space that had some purulence and dirty dishwasher drainage.  Patient had some minimal tissue that needed debridement.  Dr. Diona Fanti did debride some tissue near the scrotum.  He will dictate this under separate cover.  Details of procedure: As the patient was can scented he was taken back to the OR from the ICU.  He was placed under general anesthesia.  Patient was prepped and draped standard fashion.  A timeout was called all facts verified.  The area was visualized for any further necrotic tissue.  There was none in the right gluteal area.  There appeared to be some necrotic tissue that was just to the right of the anus.  This tracked cephalad and to the pelvic floor.  There was some dirty dishwater fluid that was drained.  There is no further purulence throughout the previously debrided area.  The area was irrigated out with a pulse lavage.  The area was then packed as well as the ischial rectal space with a saline soaked Kerlix.  This was dressed with ABD pad.  Patient taught procedure well was taken to the ICU intubated in stable  condition.  Excisional debridement:  Tool used for debridement -cautery   Frequency of surgical debridement.   Second debridement   Measurement of total devitalized tissue (wound surface) before and after surgical debridement.   20 x 20 cm  Area and depth of devitalized tissue removed from wound.  5 x 5 cm  Blood loss and description of tissue removed.  10 cc  Evidence of the progress of the wound's response to treatment.  A.  Current wound volume (current dimensions and depth).  20 x 20 cm  B.  Presence (and extent of) of infection.  Yes, 5 x 5 cm  C.  Presence (and extent of) of non viable tissue.  yes 5 x 5 cm  D.  Other material in the wound that is expected to inhibit healing.  None  Was there any viable tissue removed (measurements): No     PLAN OF CARE: Admit to inpatient   PATIENT DISPOSITION:  ICU - intubated and hemodynamically stable.   Delay start of Pharmacological VTE agent (>24hrs) due to surgical blood loss or risk of bleeding: not applicable

## 2020-07-15 NOTE — Transfer of Care (Signed)
Immediate Anesthesia Transfer of Care Note  Patient: Omar Todd  Procedure(s) Performed: INCISION AND DRAINAGE, IRRIGATION AND DEBRIDEMENT SOFT TISSUE INFECTION NECROTIZING FASCIITIS (N/A )  Patient Location: ICU  Anesthesia Type:General  Level of Consciousness: sedated and pateint uncooperative  Airway & Oxygen Therapy: Patient remains intubated per anesthesia plan and Patient placed on Ventilator (see vital sign flow sheet for setting)  Post-op Assessment: Report given to RN and Post -op Vital signs reviewed and stable  Post vital signs: Reviewed and stable  Last Vitals:  Vitals Value Taken Time  BP    Temp    Pulse    Resp    SpO2      Last Pain:  Vitals:   07/18/2020 0800  TempSrc: Oral  PainSc:       Patients Stated Pain Goal: 5 (67/59/16 3846)  Complications: No complications documented.

## 2020-07-15 NOTE — Progress Notes (Signed)
2 Days Post-Op   Subjective/Chief Complaint: Pt con't on vent support and pressor support   Objective: Vital signs in last 24 hours: Temp:  [97.8 F (36.6 C)-100.1 F (37.8 C)] 98.8 F (37.1 C) (08/25 0800) Pulse Rate:  [60-84] 70 (08/25 0630) Resp:  [10-17] 13 (08/25 0630) BP: (78-133)/(38-74) 105/54 (08/25 0630) SpO2:  [92 %-100 %] 97 % (08/25 0855) FiO2 (%):  [30 %-40 %] 30 % (08/25 0855) Weight:  [80.7 kg] 80.7 kg (08/24 1300) Last BM Date: 07/19/2020  Intake/Output from previous day: 08/24 0701 - 08/25 0700 In: 6361.3 [I.V.:5032.5; Blood:488.3; IV Piggyback:840.6] Out: 1050 [Urine:1050] Intake/Output this shift: No intake/output data recorded.   PE:  Constitutional: No acute distress, intubated, appears states age. Eyes: Anicteric sclerae, moist conjunctiva, no lid lag Lungs: Clear to auscultation bilaterally, normal respiratory effort CV: regular rate and rhythm, no murmurs, no peripheral edema, pedal pulses 2+ GI: Soft, no masses or hepatosplenomegaly, non-tender to palpation Skin: wound packed with kerlix Neuro: sedated, awakens and tracks   Lab Results:  Recent Labs    07/14/20 0817 07/04/2020 0355  WBC 22.9* 17.9*  HGB 6.7* 7.8*  HCT 24.3* 26.3*  PLT 355 276   BMET Recent Labs    07/14/20 1730 07/08/2020 0355  NA 139 139  K 3.2* 4.5  CL 111 110  CO2 22 21*  GLUCOSE 96 114*  BUN 23 25*  CREATININE 0.53* 0.63  CALCIUM 6.6* 7.2*   PT/INR Recent Labs    07/16/2020 1800  LABPROT 15.2  INR 1.3*   ABG Recent Labs    07/09/2020 1850 07/14/20 1413  PHART 7.463* 7.392  HCO3 26.0 23.4    Studies/Results: DG CHEST PORT 1 VIEW  Result Date: 06/30/2020 CLINICAL DATA:  Respiratory failure. EXAM: PORTABLE CHEST 1 VIEW COMPARISON:  July 14, 2020. FINDINGS: Stable cardiomediastinal silhouette. Endotracheal and nasogastric tubes are unchanged in position. No pneumothorax is noted. Right-sided PICC line is unchanged. Stable bibasilar opacities are noted  concerning for atelectasis or infiltrates, left greater than right. Small left pleural effusion may be present. Bony thorax is unremarkable. IMPRESSION: Stable support apparatus. Stable bibasilar opacities are noted concerning for atelectasis or infiltrates, left greater than right. Electronically Signed   By: Marijo Conception M.D.   On: 07/21/2020 08:09   DG CHEST PORT 1 VIEW  Result Date: 07/14/2020 CLINICAL DATA:  69 year old male with respiratory failure. Decubitus wound, sepsis. EXAM: PORTABLE CHEST 1 VIEW COMPARISON:  Portable chest 07/19/2020 and earlier. FINDINGS: Portable AP semi upright view at 1418 hours. Endotracheal tube tip in good position at the clavicles. Enteric tube courses to the stomach, side hole the level of the gastric fundus. Right upper extremity PICC line has been placed, tip is at the cavoatrial junction level. Mediastinal contours remain within normal limits. There is increased confluent retrocardiac opacity on the left suggesting lower lobe collapse or consolidation. No superimposed pneumothorax, pulmonary edema, or definite effusion. Allowing for portable technique the right lung remains clear. Visible bowel-gas pattern within normal limits. IMPRESSION: 1. New left lower lobe collapse or consolidation since yesterday. 2. Right upper extremity PICC line placed, tip at the cavoatrial junction level. 3. Otherwise stable lines and tubes. Electronically Signed   By: Genevie Ann M.D.   On: 07/14/2020 14:56   DG CHEST PORT 1 VIEW  Result Date: 07/14/2020 CLINICAL DATA:  Respiratory failure EXAM: PORTABLE CHEST 1 VIEW COMPARISON:  07/04/2020 FINDINGS: Endotracheal tube and gastric catheter are now seen in satisfactory position. Cardiac shadow is  stable. Lungs are well aerated bilaterally. No focal infiltrate or sizable effusion is seen. No pneumothorax is noted. No bony abnormality is noted. IMPRESSION: Interval intubation.  No acute abnormality is noted. Electronically Signed   By: Inez Catalina M.D.   On: 07/19/2020 17:24   [place] EKG SITE RITE  Result Date: 07/14/2020 If Site Rite image not attached, placement could not be confirmed due to current cardiac rhythm.  US Abdomen Limited RUQ  Result Date: 07/14/2020 CLINICAL DATA:  Elevated LFT EXAM: ULTRASOUND ABDOMEN LIMITED RIGHT UPPER QUADRANT COMPARISON:  None. FINDINGS: Gallbladder: Negative for gallstones. Mild sludge in the gallbladder. Gallbladder wall not significantly thickened. Patient is intubated and sedated therefore Murphy sign not considered accurate. Common bile duct: Diameter: 4.1 mm Liver: Increased echogenicity liver. No focal liver lesion. Portal vein is patent on color Doppler imaging with normal direction of blood flow towards the liver. Other: Limited study due to intubation. IMPRESSION: Increased echogenicity liver compatible with fatty infiltration. No liver mass. Mild gallbladder sludge.  No biliary dilatation. Electronically Signed   By: Franchot Gallo M.D.   On: 07/14/2020 14:28    Anti-infectives: Anti-infectives (From admission, onward)   Start     Dose/Rate Route Frequency Ordered Stop   06/25/2020 1500  clindamycin (CLEOCIN) IVPB 600 mg        600 mg 100 mL/hr over 30 Minutes Intravenous Every 6 hours 07/10/2020 1337     06/22/2020 0600  vancomycin (VANCOREADY) IVPB 750 mg/150 mL        750 mg 150 mL/hr over 60 Minutes Intravenous Every 8 hours 06/21/2020 2310     07/21/2020 0200  metroNIDAZOLE (FLAGYL) IVPB 500 mg  Status:  Discontinued        500 mg 100 mL/hr over 60 Minutes Intravenous Every 8 hours 07/11/2020 2220 07/06/2020 1337   07/11/2020 2315  ceFEPIme (MAXIPIME) 2 g in sodium chloride 0.9 % 100 mL IVPB        2 g 200 mL/hr over 30 Minutes Intravenous Every 8 hours 06/23/2020 2310     07/08/2020 2000  vancomycin (VANCOREADY) IVPB 1500 mg/300 mL        1,500 mg 150 mL/hr over 120 Minutes Intravenous  Once 06/28/2020 1834 07/06/2020 2326   07/05/2020 1800  vancomycin (VANCOCIN) IVPB 1000 mg/200 mL premix  Status:   Discontinued        1,000 mg 200 mL/hr over 60 Minutes Intravenous  Once 07/04/2020 1757 06/21/2020 1834   07/01/2020 1800  clindamycin (CLEOCIN) IVPB 600 mg        600 mg 100 mL/hr over 30 Minutes Intravenous  Once 07/05/2020 1759 07/11/2020 1913   07/03/2020 1800  ciprofloxacin (CIPRO) IVPB 400 mg        400 mg 200 mL/hr over 60 Minutes Intravenous  Once 07/15/2020 1759 06/24/2020 2039      Assessment/Plan: 47 M with nec fasciitis and fournier's gangrene. Patient Active Problem List   Diagnosis Date Noted  . Acute respiratory failure with hypoxia ([place])   . Dehiscence of amputation stump ([place])   . Necrotizing fasciitis ([place])   . Fournier's gangrene of scrotum   . Surgical wound infection   . Sepsis ([place]) 07/16/2020  . 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 Right BKA infection ([place]) 06/18/2020  . Hyperglycemia due to diabetes mellitus ([place]) 06/18/2020  . Thrombocytosis ([place]) 06/18/2020  . Right ischial pressure sore, unstageable ([place]) 05/16/2020  . Severe protein-calorie malnutrition ([place])   . Gangrene of right foot ([place]) 03/26/2020  .  PAD (peripheral artery disease) ([place]) 03/26/2020  . Normocytic anemia 03/12/2020  . GI bleed 03/12/2020  . AAA (abdominal aortic aneurysm) without rupture ([place]) 02/28/2020  . Critical lower limb ischemia 02/28/2020  . BPH (benign prostatic hyperplasia) 10/31/2014  . Systolic ejection murmur 24/07/7352  . Bilateral carotid bruits 10/31/2014  . Erectile dysfunction 11/05/2013  . Bilateral carotid artery disease ([place]) 04/11/2013  . Hyperlipidemia associated with type 2 diabetes mellitus ([place]) 04/11/2013  . Hypertension associated with diabetes ([place]) 04/11/2013  . COPD exacerbation ([place]) 04/11/2013  . Diabetes mellitus type 2 controlled 04/11/2013  . Diabetic neuropathy, type II diabetes mellitus ([place]) 03/15/2013    PLan:  To OR today for 2nd look and D&I COn't abx Appreciate CCM assistance   LOS: 3 days    Ralene Ok 07/21/2020

## 2020-07-16 ENCOUNTER — Inpatient Hospital Stay (HOSPITAL_COMMUNITY): Payer: Medicare Other | Admitting: Certified Registered Nurse Anesthetist

## 2020-07-16 ENCOUNTER — Encounter (HOSPITAL_COMMUNITY): Admission: EM | Disposition: E | Payer: Self-pay | Source: Home / Self Care | Attending: Pulmonary Disease

## 2020-07-16 ENCOUNTER — Inpatient Hospital Stay (HOSPITAL_COMMUNITY): Payer: Medicare Other

## 2020-07-16 ENCOUNTER — Encounter (HOSPITAL_COMMUNITY): Payer: Self-pay | Admitting: General Surgery

## 2020-07-16 DIAGNOSIS — R579 Shock, unspecified: Secondary | ICD-10-CM

## 2020-07-16 HISTORY — PX: WOUND DEBRIDEMENT: SHX247

## 2020-07-16 LAB — CBC
HCT: 23.4 % — ABNORMAL LOW (ref 39.0–52.0)
Hemoglobin: 6.7 g/dL — CL (ref 13.0–17.0)
MCH: 23.8 pg — ABNORMAL LOW (ref 26.0–34.0)
MCHC: 28.6 g/dL — ABNORMAL LOW (ref 30.0–36.0)
MCV: 83.3 fL (ref 80.0–100.0)
Platelets: 195 10*3/uL (ref 150–400)
RBC: 2.81 MIL/uL — ABNORMAL LOW (ref 4.22–5.81)
RDW: 17.9 % — ABNORMAL HIGH (ref 11.5–15.5)
WBC: 15 10*3/uL — ABNORMAL HIGH (ref 4.0–10.5)
nRBC: 0 % (ref 0.0–0.2)

## 2020-07-16 LAB — COMPREHENSIVE METABOLIC PANEL
ALT: 197 U/L — ABNORMAL HIGH (ref 0–44)
AST: 182 U/L — ABNORMAL HIGH (ref 15–41)
Albumin: 1.2 g/dL — ABNORMAL LOW (ref 3.5–5.0)
Alkaline Phosphatase: 118 U/L (ref 38–126)
Anion gap: 6 (ref 5–15)
BUN: 27 mg/dL — ABNORMAL HIGH (ref 8–23)
CO2: 23 mmol/L (ref 22–32)
Calcium: 7.2 mg/dL — ABNORMAL LOW (ref 8.9–10.3)
Chloride: 110 mmol/L (ref 98–111)
Creatinine, Ser: 0.62 mg/dL (ref 0.61–1.24)
GFR calc Af Amer: >60 mL/min (ref >60)
GFR calc non Af Amer: >60 mL/min (ref >60)
Glucose, Bld: 214 mg/dL — ABNORMAL HIGH (ref 70–99)
Potassium: 3.8 mmol/L (ref 3.5–5.1)
Sodium: 139 mmol/L (ref 135–145)
Total Bilirubin: 0.7 mg/dL (ref 0.3–1.2)
Total Protein: 3.8 g/dL — ABNORMAL LOW (ref 6.5–8.1)

## 2020-07-16 LAB — TRIGLYCERIDES: Triglycerides: 179 mg/dL — ABNORMAL HIGH (ref <150)

## 2020-07-16 LAB — GLUCOSE, CAPILLARY
Glucose-Capillary: 167 mg/dL — ABNORMAL HIGH (ref 70–99)
Glucose-Capillary: 167 mg/dL — ABNORMAL HIGH (ref 70–99)
Glucose-Capillary: 173 mg/dL — ABNORMAL HIGH (ref 70–99)
Glucose-Capillary: 192 mg/dL — ABNORMAL HIGH (ref 70–99)
Glucose-Capillary: 195 mg/dL — ABNORMAL HIGH (ref 70–99)
Glucose-Capillary: 203 mg/dL — ABNORMAL HIGH (ref 70–99)
Glucose-Capillary: 205 mg/dL — ABNORMAL HIGH (ref 70–99)

## 2020-07-16 LAB — AEROBIC CULTURE W GRAM STAIN (SUPERFICIAL SPECIMEN): Gram Stain: NONE SEEN

## 2020-07-16 LAB — MAGNESIUM: Magnesium: 1.6 mg/dL — ABNORMAL LOW (ref 1.7–2.4)

## 2020-07-16 LAB — PREPARE RBC (CROSSMATCH)

## 2020-07-16 LAB — RESP PANEL BY RT PCR (RSV, FLU A&B, COVID)
Influenza A by PCR: NEGATIVE
Influenza B by PCR: NEGATIVE
Respiratory Syncytial Virus by PCR: NEGATIVE
SARS Coronavirus 2 by RT PCR: NEGATIVE

## 2020-07-16 LAB — CK TOTAL AND CKMB (NOT AT ARMC)
CK, MB: 4.2 ng/mL (ref 0.5–5.0)
Relative Index: 1.1 (ref 0.0–2.5)
Total CK: 385 U/L (ref 49–397)

## 2020-07-16 LAB — PHOSPHORUS: Phosphorus: 1.5 mg/dL — ABNORMAL LOW (ref 2.5–4.6)

## 2020-07-16 LAB — VITAMIN D 25 HYDROXY (VIT D DEFICIENCY, FRACTURES): Vit D, 25-Hydroxy: 39.53 ng/mL (ref 30–100)

## 2020-07-16 IMAGING — DX DG CHEST 1V PORT
1 series · 1 of 1 positions shown · non-contrast
Comparison: [DATE].

CLINICAL DATA: Respiratory failure.

EXAM:
PORTABLE CHEST 1 VIEW

[chest ap]
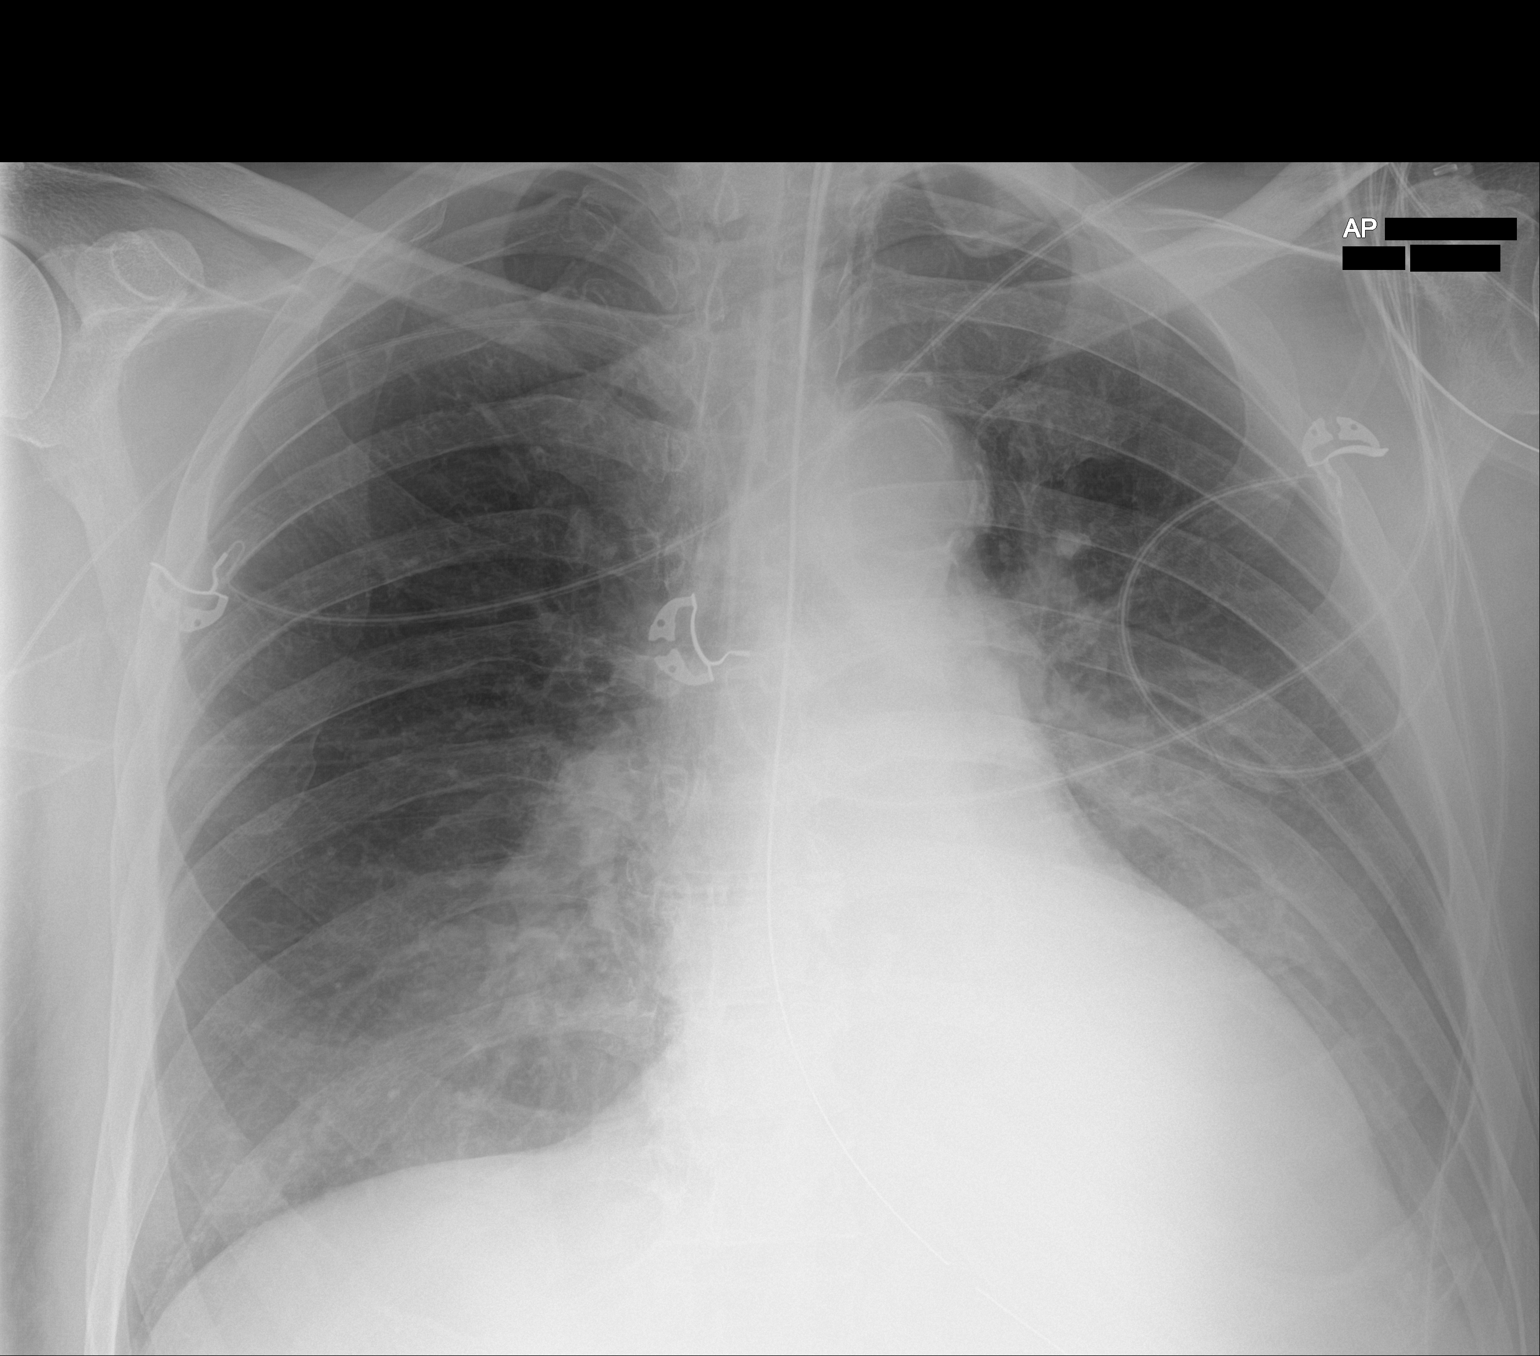

[1 of 1 positions shown; findings below may reference images not displayed]

FINDINGS: Endotracheal tube, NG tube, right PICC line stable position. Stable
cardiomegaly. Unchanged bibasilar infiltrates/edema. Unchanged small
left pleural effusion. No pneumothorax.
IMPRESSION: 1.  Lines and tubes in stable position.

2.  Stable cardiomegaly.

3. Unchanged bibasilar infiltrates/edema. Unchanged small left
pleural effusion.

## 2020-07-16 SURGERY — DEBRIDEMENT, WOUND
Anesthesia: General

## 2020-07-16 MED ORDER — POLYETHYLENE GLYCOL 3350 17 G PO PACK: 17.0000 g | PACK | Freq: Every day | ORAL | Status: DC | PRN

## 2020-07-16 MED ORDER — DAKINS (1/4 STRENGTH) 0.125 % EX SOLN
Freq: Once | CUTANEOUS | Status: AC
  Administered 2020-07-16: 1
  Filled 2020-07-16: qty 473

## 2020-07-16 MED ORDER — POTASSIUM PHOSPHATES 15 MMOLE/5ML IV SOLN
24.0000 mmol | Freq: Once | INTRAVENOUS | Status: AC
  Administered 2020-07-16: 24 mmol via INTRAVENOUS
  Filled 2020-07-16: qty 8

## 2020-07-16 MED ORDER — SODIUM CHLORIDE 0.9 % IV SOLN
2.0000 g | Freq: Three times a day (TID) | INTRAVENOUS | Status: DC
  Administered 2020-07-16 – 2020-07-17 (×3): 2 g via INTRAVENOUS
  Filled 2020-07-16 (×3): qty 2

## 2020-07-16 MED ORDER — PROPOFOL 10 MG/ML IV BOLUS
INTRAVENOUS | Status: AC
  Filled 2020-07-16: qty 20

## 2020-07-16 MED ORDER — 0.9 % SODIUM CHLORIDE (POUR BTL) OPTIME
TOPICAL | Status: DC | PRN
  Administered 2020-07-16: 1000 mL

## 2020-07-16 MED ORDER — MAGNESIUM SULFATE 4 GM/100ML IV SOLN
4.0000 g | Freq: Once | INTRAVENOUS | Status: AC
  Administered 2020-07-16: 4 g via INTRAVENOUS
  Filled 2020-07-16: qty 100

## 2020-07-16 MED ORDER — SODIUM CHLORIDE 0.9% IV SOLUTION: Freq: Once | INTRAVENOUS | Status: AC

## 2020-07-16 MED ORDER — SODIUM CHLORIDE 0.9 % IR SOLN
Status: DC | PRN
  Administered 2020-07-16: 3000 mL

## 2020-07-16 MED ORDER — POLYETHYLENE GLYCOL 3350 17 G PO PACK
17.0000 g | PACK | Freq: Every day | ORAL | Status: DC
  Administered 2020-07-18: 17 g via ORAL
  Filled 2020-07-16: qty 1

## 2020-07-16 SURGICAL SUPPLY — 43 items
ADH SKN CLS APL DERMABOND .7 (GAUZE/BANDAGES/DRESSINGS) ×1
BLADE HEX COATED 2.75 (ELECTRODE) ×3 IMPLANT
BLADE SURG SZ10 CARB STEEL (BLADE) ×6 IMPLANT
BNDG GAUZE ELAST 4 BULKY (GAUZE/BANDAGES/DRESSINGS) ×2 IMPLANT
COVER WAND RF STERILE (DRAPES) IMPLANT
DECANTER SPIKE VIAL GLASS SM (MISCELLANEOUS) IMPLANT
DERMABOND ADVANCED (GAUZE/BANDAGES/DRESSINGS) ×2
DERMABOND ADVANCED .7 DNX12 (GAUZE/BANDAGES/DRESSINGS) ×1 IMPLANT
DRAPE LAPAROTOMY T 102X78X121 (DRAPES) IMPLANT
DRAPE LAPAROTOMY TRNSV 102X78 (DRAPES) IMPLANT
DRAPE SHEET LG 3/4 BI-LAMINATE (DRAPES) IMPLANT
DRAPE UNDERBUTTOCKS STRL (DISPOSABLE) ×2 IMPLANT
DRSG PAD ABDOMINAL 8X10 ST (GAUZE/BANDAGES/DRESSINGS) ×2 IMPLANT
ELECT PENCIL ROCKER SW 15FT (MISCELLANEOUS) ×2 IMPLANT
ELECT REM PT RETURN 15FT ADLT (MISCELLANEOUS) ×3 IMPLANT
EVACUATOR SILICONE 100CC (DRAIN) IMPLANT
GAUZE SPONGE 4X4 12PLY STRL (GAUZE/BANDAGES/DRESSINGS) ×3 IMPLANT
GLOVE BIO SURGEON STRL SZ7.5 (GLOVE) ×6 IMPLANT
GLOVE BIOGEL PI IND STRL 7.0 (GLOVE) ×1 IMPLANT
GLOVE BIOGEL PI INDICATOR 7.0 (GLOVE) ×2
GOWN STRL REUS W/ TWL XL LVL3 (GOWN DISPOSABLE) ×1 IMPLANT
GOWN STRL REUS W/TWL LRG LVL3 (GOWN DISPOSABLE) ×3 IMPLANT
GOWN STRL REUS W/TWL XL LVL3 (GOWN DISPOSABLE) ×6 IMPLANT
KIT BASIN OR (CUSTOM PROCEDURE TRAY) ×3 IMPLANT
KIT TURNOVER KIT A (KITS) IMPLANT
LEGGING LITHOTOMY PAIR STRL (DRAPES) ×2 IMPLANT
MARKER SKIN DUAL TIP RULER LAB (MISCELLANEOUS) IMPLANT
NDL HYPO 25X1 1.5 SAFETY (NEEDLE) ×1 IMPLANT
NEEDLE HYPO 25X1 1.5 SAFETY (NEEDLE) ×3 IMPLANT
NS IRRIG 1000ML POUR BTL (IV SOLUTION) ×3 IMPLANT
PACK BASIC VI WITH GOWN DISP (CUSTOM PROCEDURE TRAY) ×3 IMPLANT
PENCIL SMOKE EVACUATOR (MISCELLANEOUS) IMPLANT
SOL PREP POV-IOD 4OZ 10% (MISCELLANEOUS) ×3 IMPLANT
SPONGE LAP 18X18 RF (DISPOSABLE) ×2 IMPLANT
SPONGE LAP 4X18 RFD (DISPOSABLE) ×3 IMPLANT
STAPLER VISISTAT 35W (STAPLE) IMPLANT
SUT MNCRL AB 4-0 PS2 18 (SUTURE) IMPLANT
SUT VIC AB 3-0 SH 18 (SUTURE) IMPLANT
SYR CONTROL 10ML LL (SYRINGE) ×3 IMPLANT
TAPE PAPER 3X10 WHT MICROPORE (GAUZE/BANDAGES/DRESSINGS) ×2 IMPLANT
TOWEL OR 17X26 10 PK STRL BLUE (TOWEL DISPOSABLE) ×3 IMPLANT
WATER STERILE IRR 1000ML POUR (IV SOLUTION) ×3 IMPLANT
YANKAUER SUCT BULB TIP 10FT TU (MISCELLANEOUS) IMPLANT

## 2020-07-16 NOTE — Interval H&P Note (Signed)
History and Physical Interval Note:  07/11/2020 12:34 PM  Omar Todd  has presented today for surgery, with the diagnosis of [place].  The various methods of treatment have been discussed with the patient and family. After consideration of risks, benefits and other options for treatment, the patient has consented to  Procedure(s): DEBRIDEMENT OF SOFT TISSUE INFECTION (N/A) as a surgical intervention.  The patient's history has been reviewed, patient examined, no change in status, stable for surgery.  I have reviewed the patient's chart and labs.  Questions were answered to the patient's satisfaction.     Ralene Ok

## 2020-07-16 NOTE — Anesthesia Preprocedure Evaluation (Signed)
Anesthesia Evaluation  Patient identified by MRN, date of birth, ID band Patient unresponsive    Reviewed: NPO status Preop documentation limited or incomplete due to emergent nature of procedure.  Airway Mallampati: Intubated       Dental   Pulmonary COPD, Current Smoker,    Pulmonary exam normal        Cardiovascular hypertension, Pt. on medications Normal cardiovascular exam     Neuro/Psych    GI/Hepatic GERD  Medicated and Controlled,  Endo/Other  diabetes, Type 2  Renal/GU      Musculoskeletal   Abdominal   Peds  Hematology   Anesthesia Other Findings   Reproductive/Obstetrics                             Anesthesia Physical Anesthesia Plan  ASA: IV  Anesthesia Plan: General   Post-op Pain Management:    Induction: Intravenous  PONV Risk Score and Plan: Treatment may vary due to age or medical condition  Airway Management Planned: Oral ETT  Additional Equipment:   Intra-op Plan:   Post-operative Plan: Post-operative intubation/ventilation  Informed Consent: I have reviewed the patients History and Physical, chart, labs and discussed the procedure including the risks, benefits and alternatives for the proposed anesthesia with the patient or authorized representative who has indicated his/her understanding and acceptance.       Plan Discussed with: CRNA and Surgeon  Anesthesia Plan Comments:         Anesthesia Quick Evaluation

## 2020-07-16 NOTE — Consult Note (Signed)
WOC Nurse Consult Note: Reason for Consult: necrotizing fasciitis of his right buttock/perineum/ scrotum, initial debridement by Dr. Constance Haw 2 days ago.   yesterday, urology explored the open scrotal wound and testicle/ cord are fairly clean.   May require additional trips to the OR.  Surgery is managing the wounds.  Please reconsult if WOC needs are identified.   Wound type:infectious Pressure Injury POA: Yes NS moist kerlix in place per surgery orders.   Will not follow at this time.  Please re-consult if needed.  Domenic Moras MSN, RN, FNP-BC CWON Wound, Ostomy, Continence Nurse Pager [phone]

## 2020-07-16 NOTE — Progress Notes (Signed)
Day of Surgery   Subjective/Chief Complaint: Pt with no acute changes    Objective: Vital signs in last 24 hours: Temp:  [98.3 F (36.8 C)-100.5 F (38.1 C)] 100.5 F (38.1 C) (08/26 0800) Pulse Rate:  [65-85] 81 (08/26 1230) Resp:  [5-18] 14 (08/26 1230) BP: (98-135)/(41-85) 111/51 (08/26 1230) SpO2:  [88 %-100 %] 92 % (08/26 1230) FiO2 (%):  [30 %-50 %] 40 % (08/26 1130) Weight:  [84.5 kg] 84.5 kg (08/26 0403) Last BM Date: 07/11/2020  Intake/Output from previous day: 08/25 0701 - 08/26 0700 In: 4251.7 [I.V.:2589.9; NG/GT:1515.8; IV Piggyback:146] Out: 9798 [Urine:1350; Blood:100] Intake/Output this shift: Total I/O In: 1005 [I.V.:760; NG/GT:195; IV Piggyback:50] Out: 500 [Urine:500]  PE:  Constitutional: No acute distress, intubated, appears states age. Eyes: Anicteric sclerae, moist conjunctiva, no lid lag Lungs: Clear to auscultation bilaterally, normal respiratory effort CV: regular rate and rhythm, no murmurs, no peripheral edema, pedal pulses 2+ GI: Soft, no masses or hepatosplenomegaly, non-tender to palpation Skin: wound packed with kerlix Neuro: sedated  Lab Results:  Recent Labs    06/22/2020 0355 07/21/2020 0425  WBC 17.9* 15.0*  HGB 7.8* 6.7*  HCT 26.3* 23.4*  PLT 276 195   BMET Recent Labs    07/10/2020 0355 07/14/2020 0425  NA 139 139  K 4.5 3.8  CL 110 110  CO2 21* 23  GLUCOSE 114* 214*  BUN 25* 27*  CREATININE 0.63 0.62  CALCIUM 7.2* 7.2*   PT/INR No results for input(s): LABPROT, INR in the last 72 hours. ABG Recent Labs    07/17/2020 1850 07/14/20 1413  PHART 7.463* 7.392  HCO3 26.0 23.4    Studies/Results: DG CHEST PORT 1 VIEW  Result Date: 07/05/2020 CLINICAL DATA:  Respiratory failure. EXAM: PORTABLE CHEST 1 VIEW COMPARISON:  07/06/2020. FINDINGS: Endotracheal tube, NG tube, right PICC line stable position. Stable cardiomegaly. Unchanged bibasilar infiltrates/edema. Unchanged small left pleural effusion. No pneumothorax.  IMPRESSION: 1.  Lines and tubes in stable position. 2.  Stable cardiomegaly. 3. Unchanged bibasilar infiltrates/edema. Unchanged small left pleural effusion. Electronically Signed   By: Marcello Moores  Register   On: 07/18/2020 06:21   DG CHEST PORT 1 VIEW  Result Date: 07/06/2020 CLINICAL DATA:  Respiratory failure. EXAM: PORTABLE CHEST 1 VIEW COMPARISON:  July 14, 2020. FINDINGS: Stable cardiomediastinal silhouette. Endotracheal and nasogastric tubes are unchanged in position. No pneumothorax is noted. Right-sided PICC line is unchanged. Stable bibasilar opacities are noted concerning for atelectasis or infiltrates, left greater than right. Small left pleural effusion may be present. Bony thorax is unremarkable. IMPRESSION: Stable support apparatus. Stable bibasilar opacities are noted concerning for atelectasis or infiltrates, left greater than right. Electronically Signed   By: Marijo Conception M.D.   On: 07/17/2020 08:09   DG CHEST PORT 1 VIEW  Result Date: 07/14/2020 CLINICAL DATA:  69 year old male with respiratory failure. Decubitus wound, sepsis. EXAM: PORTABLE CHEST 1 VIEW COMPARISON:  Portable chest 07/17/2020 and earlier. FINDINGS: Portable AP semi upright view at 1418 hours. Endotracheal tube tip in good position at the clavicles. Enteric tube courses to the stomach, side hole the level of the gastric fundus. Right upper extremity PICC line has been placed, tip is at the cavoatrial junction level. Mediastinal contours remain within normal limits. There is increased confluent retrocardiac opacity on the left suggesting lower lobe collapse or consolidation. No superimposed pneumothorax, pulmonary edema, or definite effusion. Allowing for portable technique the right lung remains clear. Visible bowel-gas pattern within normal limits. IMPRESSION: 1. New left lower  lobe collapse or consolidation since yesterday. 2. Right upper extremity PICC line placed, tip at the cavoatrial junction level. 3. Otherwise  stable lines and tubes. Electronically Signed   By: Genevie Ann M.D.   On: 07/14/2020 14:56   US Abdomen Limited RUQ  Result Date: 07/14/2020 CLINICAL DATA:  Elevated LFT EXAM: ULTRASOUND ABDOMEN LIMITED RIGHT UPPER QUADRANT COMPARISON:  None. FINDINGS: Gallbladder: Negative for gallstones. Mild sludge in the gallbladder. Gallbladder wall not significantly thickened. Patient is intubated and sedated therefore Murphy sign not considered accurate. Common bile duct: Diameter: 4.1 mm Liver: Increased echogenicity liver. No focal liver lesion. Portal vein is patent on color Doppler imaging with normal direction of blood flow towards the liver. Other: Limited study due to intubation. IMPRESSION: Increased echogenicity liver compatible with fatty infiltration. No liver mass. Mild gallbladder sludge.  No biliary dilatation. Electronically Signed   By: Franchot Gallo M.D.   On: 07/14/2020 14:28    Anti-infectives: Anti-infectives (From admission, onward)   Start     Dose/Rate Route Frequency Ordered Stop   07/01/2020 1500  clindamycin (CLEOCIN) IVPB 600 mg        600 mg 100 mL/hr over 30 Minutes Intravenous Every 6 hours 07/02/2020 1337     06/30/2020 0600  vancomycin (VANCOREADY) IVPB 750 mg/150 mL        750 mg 150 mL/hr over 60 Minutes Intravenous Every 8 hours 07/04/2020 2310     07/12/2020 0200  metroNIDAZOLE (FLAGYL) IVPB 500 mg  Status:  Discontinued        500 mg 100 mL/hr over 60 Minutes Intravenous Every 8 hours 07/02/2020 2220 07/19/2020 1337   07/11/2020 2315  ceFEPIme (MAXIPIME) 2 g in sodium chloride 0.9 % 100 mL IVPB        2 g 200 mL/hr over 30 Minutes Intravenous Every 8 hours 07/09/2020 2310     06/22/2020 2000  vancomycin (VANCOREADY) IVPB 1500 mg/300 mL        1,500 mg 150 mL/hr over 120 Minutes Intravenous  Once 07/17/2020 1834 07/06/2020 2326   07/04/2020 1800  vancomycin (VANCOCIN) IVPB 1000 mg/200 mL premix  Status:  Discontinued        1,000 mg 200 mL/hr over 60 Minutes Intravenous  Once 07/20/2020 1757  07/03/2020 1834   07/04/2020 1800  clindamycin (CLEOCIN) IVPB 600 mg        600 mg 100 mL/hr over 30 Minutes Intravenous  Once 07/11/2020 1759 06/26/2020 1913   07/05/2020 1800  ciprofloxacin (CIPRO) IVPB 400 mg        400 mg 200 mL/hr over 60 Minutes Intravenous  Once 06/22/2020 1759 06/30/2020 2039      Assessment/Plan: 1 M with nec fasciitis and fournier's gangrene.     Patient Active Problem List   Diagnosis Date Noted  . Acute respiratory failure with hypoxia ([place])   . Dehiscence of amputation stump ([place])   . Necrotizing fasciitis ([place])   . Fournier's gangrene of scrotum   . Surgical wound infection   . Sepsis ([place]) 06/24/2020  . 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 Right BKA infection ([place]) 06/18/2020  . Hyperglycemia due to diabetes mellitus ([place]) 06/18/2020  . Thrombocytosis ([place]) 06/18/2020  . 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 Severe protein-calorie malnutrition ([place])   . Gangrene of right foot ([place]) 03/26/2020  . PAD (peripheral artery disease) ([place]) 03/26/2020  . Normocytic anemia 03/12/2020  . GI bleed 03/12/2020  . AAA (abdominal aortic aneurysm) without rupture ([place]) 02/28/2020  .  Critical lower limb ischemia 02/28/2020  . BPH (benign prostatic hyperplasia) 10/31/2014  . Systolic ejection murmur 45/99/7741  . Bilateral carotid bruits 10/31/2014  . Erectile dysfunction 11/05/2013  . Bilateral carotid artery disease ([place]) 04/11/2013  . Hyperlipidemia associated with type 2 diabetes mellitus ([place]) 04/11/2013  . Hypertension associated with diabetes ([place]) 04/11/2013  . COPD exacerbation ([place]) 04/11/2013  . Diabetes mellitus type 2 controlled 04/11/2013  . Diabetic neuropathy, type II diabetes mellitus ([place]) 03/15/2013    PLan:  To OR today for 3rd look and D&I COn't abx Appreciate CCM assistance    LOS: 4 days    Ralene Ok 07/11/2020

## 2020-07-16 NOTE — Progress Notes (Signed)
Inpatient Diabetes Program Recommendations  AACE/ADA: New Consensus Statement on Inpatient Glycemic Control (2015)  Target Ranges:  Prepandial:   less than 140 mg/dL      Peak postprandial:   less than 180 mg/dL (1-2 hours)      Critically ill patients:  140 - 180 mg/dL   Lab Results  Component Value Date   GLUCAP 195 (H) 06/29/2020   HGBA1C 8.3 (H) 05/15/2020    Review of Glycemic Control  Diabetes history: Dm 2 Outpatient Diabetes medications: Jardiance 25 mg Daily, metformin 1000 mg bid, Januvia 100 mg Daily Current orders for Inpatient glycemic control:  Novolog 0-9 units Q4 hours  Vital AF 1.2 CAL 50 ml/hour  Inpatient Diabetes Program Recommendations:    Pt on Jardiance at home an SGLT-2 inhibitor known to cause fournier's gangrene. Would not restart at time of d/c.  -d/c Jardiance while inpatient.  - may add Novolog 2 units Q4 hours tube feed coverage if glucose trends become elevated.  Thanks,  Tama Headings RN, MSN, BC-ADM Inpatient Diabetes Coordinator Team Pager [phone] (8a-5p)

## 2020-07-16 NOTE — Transfer of Care (Signed)
Immediate Anesthesia Transfer of Care Note  Patient: Omar Todd  Procedure(s) Performed: Procedure(s): DEBRIDEMENT OF SOFT TISSUE INFECTION (N/A)  Patient Location: PACU  Anesthesia Type:General  Level of Consciousness: Alert, Awake, Oriented  Airway & Oxygen Therapy: Patient Spontanous Breathing  Post-op Assessment: Report given to RN  Post vital signs: Reviewed and stable  Last Vitals:  Vitals:   07/17/2020 1230 06/22/2020 1247  BP: (!) 111/51 (!) 109/49  Pulse: 81 86  Resp: 14 16  Temp:  (!) 38.3 C  SpO2: 68% 95%    Complications: No apparent anesthesia complications

## 2020-07-16 NOTE — Progress Notes (Signed)
CRITICAL VALUE ALERT  Critical Value:  Hbg 6.7  Date & Time Notied:  06/24/2020 0800  Provider Notified: Ann Lions, MD  Orders Received/Actions taken: MD Aware.

## 2020-07-16 NOTE — Progress Notes (Addendum)
NAME:  TIAN Todd, MRN:  [id number], DOB:  12-26-1950, LOS: 4 ADMISSION DATE:  06/29/2020, CONSULTATION DATE:  07/14/20 REFERRING MD:  [name] trnasfer, CHIEF COMPLAINT:  [name] at [facility] History    69 oack current smoker with DM, COPD Nos, BP, lipid, GERD. 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 At some pont also has stage 2 sacral decub "Size of a quarter"- described at time of this discharge. Discharged to ? Rehab and then signed out ? AMA from rehab.   Readimmited to [facility] 06/27/2020 with sepsis syndrome(fever + SIRS with lactate 2.1) with growing. Rt buttock ulcer noted to be stage 3 large wth fecal soiling but R AKA stump incision well healed but  By 07/19/2020 purulent drainage noted. CT pelvis c/w necrotizing fasciitis (with sub cut gas) in right perineal soft tissue and bedside exam was concerning for crepitus and concern for Fournier gangrene. Patient noticed to be disoriented by 07/06/2020  On 07/21/2020 Hervey Ard excisional debridement of right buttock, right inguinal region, and right scrotal skin measuring (right buttock / right inguinal wound 20cmX10cmX4cm and right scrotal skin 15cmX8cmX2cm); Drainage and packing of Right AKA wound infection  -. 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 Concern for need for repeated debridement and urology involvement and patient transferred to [facility] from [facility] on 07/14/20. Upon arrival getting PRBC for hgb < 7gm% and sedatedh diprivan gtt and fent gtt on ventilator needing neo for bp support.    Decision maker:  he has a wife at [facility] with dementia and estranged children he has not seen since they were babies.Omar Todd [phone]  She is his closest  relative and decision maker. She does not have health care power of attorney.    Past Medical History     has a past medical history of COPD (chronic obstructive pulmonary disease) ([place]), Diabetes mellitus without complication ([place]), Gangrene of toe of right foot ([place]), GERD (gastroesophageal reflux disease), Hyperlipidemia, and Hypertension.   reports that he has been smoking cigarettes. He has a 30.00 pack-year smoking history. He has never used smokeless tobacco.   has a past surgical history that includes Thumb surgery; Esophagogastroduodenoscopy (N/A, 03/13/2020); biopsy (03/13/2020); Flexible sigmoidoscopy (N/A, 05/06/2020); Amputation (Right, 05/17/2020); Application if wound vac (Right, 05/17/2020); Amputation (Right, 06/19/2020); Incision and drainage of wound (Right, 07/05/2020); Incision and drainage (Right, 07/16/2020); and Incision and drainage abscess (N/A, 07/05/2020).   Significant Hospital Events   07/01/2020 -  admit 07/14/20  - transfer to [facility] 8/25 - s. I and D and debridement of Rt Gluteal Nec Fasc by CCS and ->   Inspection of wound, debridement of right testicle/cord, scrotal wound edge ( excised tissue approximately 2 x 4 cm) by Urology. On neo gtt, diprivan gtt, fent gtt . On vent 40% . Not on TF (yet). Ortho Dr Sharol Given recmmended -> packing the  R AKA wound daily . Seen by Dr Gloriann Loan of Urology - currently R testicle exposed but viable tissue  Consults:  8/24 -   Procedures:  8/23 -  On 07/14/2020 Hervey Ard excisional debridement of right buttock, right inguinal region, and right scrotal skin measuring (right buttock / right inguinal wound  20cmX10cmX4cm and right scrotal skin 15cmX8cmX2cm); Drainage and packing of Right AKA wound infection ETT 823  RUE PIcc 8/24  Significant Diagnostic Tests:  x  Micro Data:   Microbiology results: 8/22 BCx:  8/22 UCx:   8/22 Wound cx   Antimicrobials:  Antimicrobials this admission: 8/22 Cipro x 1 8/22 Clinda x 8/22 Flagyl >>  ? Stop date 8/22 Vanc >> 8/22 Cefepime>>   Interim history/subjective:   8/26 - PRBC < 7 and 1 u prbc ordered. On vent. BAck to OR today, . Exposure to sister with covid + . Sister Omar Todd the [place] reported that she is covid positive 8/.25/2 but she has not beeen at bedside. However, rest of family fo her is is positive. There is sister Omar Todd is the one who visited patient. This is not DPOA. Omar Todd is awaiting  Test results. On fent gtt, diprivan gtt and neo gtt. Increased fio2 need to 50%  Objective   Blood pressure (!) 110/49, pulse 76, temperature (!) 100.5 F (38.1 C), temperature source Axillary, resp. rate 14, height 6\' 2"  (1.88 m), weight 84.5 kg, SpO2 92 %.    Vent Mode: PRVC FiO2 (%):  [30 %-50 %] 50 % Set Rate:  [15 bmp] 15 bmp Vt Set:  [650 mL] 650 mL PEEP:  [5 cmH20] 5 cmH20 Plateau Pressure:  [14 cmH20-18 cmH20] 16 cmH20   Intake/Output Summary (Last 24 hours) at 07/05/2020 1032 Last data filed at 07/15/2020 0900 Gross per 24 hour  Intake 4999.43 ml  Output 1750 ml  Net 3249.43 ml   Filed Weights   07/17/2020 1643 07/14/20 1300 06/21/2020 0403  Weight: 75.4 kg 80.7 kg 84.5 kg   General Appearance:  Looks criticall ill  Head:  Normocephalic, without obvious abnormality, atraumatic Eyes:  PERRL - yes, conjunctiva/corneas - muddy     Ears:  Normal external ear canals, both ears Nose:  G tube - no Throat:  ETT TUBE - yes , OG tube - yes Neck:  Supple,  No enlargement/tenderness/nodules Lungs: Clear to auscultation bilaterally, Ventilator   Synchrony - yes Heart:  S1 and S2 normal, no murmur, CVP - x.  Pressors - yes Abdomen:  Soft, no masses, no organomegaly Genitalia / Rectal:  Rt test wiuth packing and foley + Extremities:  Extremities- Rt AKA with stump bandad Skin:  ntact in exposed areas . Sacral area - has decub Neurologic:  Sedation - fent and diprivan -> RASS - -3 . Moves all 4s - yes. CAM-ICU - unable to test . Orientation - unable to test  All same since  yesterday    Resolved Hospital Problem list   x  Assessment & Plan:  ASSESSMENT / PLAN:  PULMONARY  A:  Baseline COPD NOS Acute post op respiratory failure in setting of infected sacral ulcer and Fournier gangrene 06/30/2020.    07/11/2020 - > does not meet criteria for SBT/Extubation in setting of Acute Respiratory Failure due to surgery needs and Circulatory shock and increased fio2 to 50%  P:   BD PRVC VAP bundle Leave intubaed due to repeat needs to go to OR   NEUROLOGIC A:   Acute encephalopathy in setting of sepsis 07/12/2020 and pre-op. Left sedated post op on ventilator  06/29/2020 - WUA not started due to impending OR need again  P:   RASS goal 0 to -2 Fent gtt Diprivan gtt WUA after OR   VASCULAR A:   Circulatory shock likely due to sepsis  06/21/2020 - still on neo  P:  MAP goal > 65 Leveophed or neo for pressor need  CARDIAC STRUCTURAL A: Moderate AS in April 2021 with EF 55% and LVH  P: Clinically monitor    INFECTIOUS A:   SEvere sepsis from R Fournier gangrene, RT ischial ulcer and Rt AKA re-infection  07/05/2020 - wbc down. Low grade fever + COVID exposure + 07/14/20 or 06/28/2020 from sister at bedside who tested positive 8/25  P:   Broad abx as agive Retest covid pcr   RENAL A:  At risk for AKI  07/18/2020 - Creat normal  P:  Fluids Avoid nephrotixins Maintain BP/HR  ELECTROLYTES A:  Electrolyte imbalance  07/07/2020 - Hypokalemia Hypomagnesemia Hypophosphatemia   P: Replete all 3 K goak > 4 Mag goal > 2 Phos goal - normal   GASTROINTESTINAL A:   AT risk stress ulcer Shock liver  07/21/2020 - RUQ [place]  P:   RUQ [place] Protoonix  HEMATOLOGIC   - HEME A:  Anemia of critical illness and chronic disease - s/p PRBC 07/14/20  07/11/2020 - stable   P:  - PRBC for hgb </= 6.9gm%    - exceptions are   -  if ACS susepcted/confirmed then transfuse for hgb </= 8.0gm%,  or    -  active bleeding with hemodynamic  instability, then transfuse regardless of hemoglobin value   At at all times try to transfuse 1 unit prbc as possible with exception of active hemorrhage   ENDOCRINE A:   At risk for hypo and hyperglycemia    P:   icu hypergluycemiua protocol  MSK/DERM Rt AKA wound infection - on dressing and chaing RT sacral decub - large with nec fasc Rt Fournier gangrene   07/10/2020 s/p ID 07/21/2020 and going again 06/25/2020. New one reported left shoulder - DTI  Plan  - ortho, ccs and urology consult   Best practice:  Diet: TF with intermittent Pain/Anxiety/Delirium protocol (if indicated): sedation protocol VAP protocol (if indicated): Bundle + DVT prophylaxis: Lovenox/Heparin GI prophylaxis: ppi Glucose control: ssi Mobility: bed rest Code Status: full code  Family Communication: Sister Kamali Nephew [phone]  -updated by RN and CCS. She is feeling down due to covid. Did not expose patient at least since 07/14/20  Disposition: [facility]     ATTESTATION & SIGNATURE   The patient Omar Todd is critically ill with multiple organ systems failure and requires high complexity decision making for assessment and support, frequent evaluation and titration of therapies, application of advanced monitoring technologies and extensive interpretation of multiple databases.   Critical Care Time devoted to patient care services described in this note is  35  Minutes. This time reflects time of care of this signee Dr Brand Males. This critical care time does not reflect procedure time, or teaching time or supervisory time of PA/NP/Med student/Med Resident etc but could involve care discussion time     Dr. Brand Males, M.D., [facility]C.P Pulmonary and Critical Care Medicine Staff Physician [facility] Pager: [phone], If no answer or between  15:00h - 7:00h: call [phone]  07/15/2020 11:10 AM     LABS     PULMONARY Recent Labs  Lab 06/21/2020 1850 07/14/20 1413  PHART 7.463* 7.392  PCO2ART 35.8 39.3  PO2ART 103 96.8  HCO3 26.0 23.4  O2SAT 98.2 97.6    CBC Recent Labs  Lab 07/14/20 0817 06/21/2020 0355 07/03/2020 0425  HGB 6.7* 7.8* 6.7*  HCT 24.3* 26.3* 23.4*  WBC 22.9* 17.9* 15.0*  PLT 355 276 195    COAGULATION Recent Labs  Lab 07/05/2020 1800  INR 1.3*    CARDIAC  No results for input(s): TROPONINI in the last 168 hours. No results for input(s): PROBNP in the last 168 hours.   CHEMISTRY Recent Labs  Lab 07/19/2020 1800 07/21/2020 1940 06/30/2020 0551 07/17/2020 0551 07/14/20 0817 07/14/20 0817 07/14/20 1730 07/14/20 1730 07/06/2020 0355 06/26/2020 0425  NA   < >  --  136  --  137  --  139  --  139 139  K   < >  --  2.9*   < > 3.5   < > 3.2*   < > 4.5 3.8  CL   < >  --  102  --  106  --  111  --  110 110  CO2   < >  --  23  --  22  --  22  --  21* 23  GLUCOSE   < >  --  127*  --  139*  --  96  --  114* 214*  BUN   < >  --  12  --  23  --  23  --  25* 27*  CREATININE   < >  --  0.49*  --  0.61  --  0.53*  --  0.63 0.62  CALCIUM   < >  --  7.8*  --  7.0*  --  6.6*  --  7.2* 7.2*  MG  --  1.7  --   --   --   --   --   --   --  1.6*  PHOS  --   --   --   --   --   --   --   --   --  1.5*   < > = values in this interval not displayed.   Estimated Creatinine Clearance: 101.3 mL/min (by C-G formula based on SCr of 0.62 mg/dL).   LIVER Recent Labs  Lab 06/21/2020 1800 07/14/20 0817 07/14/20 1730 07/20/2020 0355 07/12/2020 0425  AST 28 1,151* 660* 409* 182*  ALT 24 452* 359* 324* 197*  ALKPHOS 117 95 85 96 118  BILITOT 0.9 0.6 0.6 0.5 0.7  PROT 5.7* 4.3* 3.9* 4.4* 3.8*  ALBUMIN 2.1* 1.4* 1.3* 1.4* 1.2*  INR 1.3*  --   --   --   --      INFECTIOUS Recent Labs  Lab 07/04/2020 1802 06/30/2020 2005 07/09/2020 2209  LATICACIDVEN 1.4 2.1* 1.7     ENDOCRINE CBG (last 3)  Recent Labs    07/21/2020 0003 06/30/2020 0402 07/17/2020 0803  GLUCAP 167* 167* 203*          IMAGING x48h  - image(s) personally visualized  -   highlighted in bold DG CHEST PORT 1 VIEW  Result Date: 07/08/2020 CLINICAL DATA:  Respiratory failure. EXAM: PORTABLE CHEST 1 VIEW COMPARISON:  06/24/2020. FINDINGS: Endotracheal tube, NG tube, right PICC line stable position. Stable cardiomegaly. Unchanged bibasilar infiltrates/edema. Unchanged small left pleural effusion. No pneumothorax. IMPRESSION: 1.  Lines and tubes in stable position. 2.  Stable cardiomegaly. 3. Unchanged bibasilar infiltrates/edema. Unchanged small left pleural effusion. Electronically Signed   By: Marcello Moores  Register   On: 07/08/2020 06:21   DG CHEST PORT 1 VIEW  Result Date: 07/09/2020 CLINICAL DATA:  Respiratory failure. EXAM: PORTABLE CHEST 1 VIEW COMPARISON:  July 14, 2020. FINDINGS: Stable  cardiomediastinal silhouette. Endotracheal and nasogastric tubes are unchanged in position. No pneumothorax is noted. Right-sided PICC line is unchanged. Stable bibasilar opacities are noted concerning for atelectasis or infiltrates, left greater than right. Small left pleural effusion may be present. Bony thorax is unremarkable. IMPRESSION: Stable support apparatus. Stable bibasilar opacities are noted concerning for atelectasis or infiltrates, left greater than right. Electronically Signed   By: Marijo Conception M.D.   On: 06/26/2020 08:09   DG CHEST PORT 1 VIEW  Result Date: 07/14/2020 CLINICAL DATA:  69 year old male with respiratory failure. Decubitus wound, sepsis. EXAM: PORTABLE CHEST 1 VIEW COMPARISON:  Portable chest 07/14/2020 and earlier. FINDINGS: Portable AP semi upright view at 1418 hours. Endotracheal tube tip in good position at the clavicles. Enteric tube courses to the stomach, side hole the level of the gastric fundus. Right upper extremity PICC line has been placed, tip is at the cavoatrial junction level. Mediastinal contours remain within normal limits. There is increased confluent retrocardiac opacity  on the left suggesting lower lobe collapse or consolidation. No superimposed pneumothorax, pulmonary edema, or definite effusion. Allowing for portable technique the right lung remains clear. Visible bowel-gas pattern within normal limits. IMPRESSION: 1. New left lower lobe collapse or consolidation since yesterday. 2. Right upper extremity PICC line placed, tip at the cavoatrial junction level. 3. Otherwise stable lines and tubes. Electronically Signed   By: Genevie Ann M.D.   On: 07/14/2020 14:56   US Abdomen Limited RUQ  Result Date: 07/14/2020 CLINICAL DATA:  Elevated LFT EXAM: ULTRASOUND ABDOMEN LIMITED RIGHT UPPER QUADRANT COMPARISON:  None. FINDINGS: Gallbladder: Negative for gallstones. Mild sludge in the gallbladder. Gallbladder wall not significantly thickened. Patient is intubated and sedated therefore Murphy sign not considered accurate. Common bile duct: Diameter: 4.1 mm Liver: Increased echogenicity liver. No focal liver lesion. Portal vein is patent on color Doppler imaging with normal direction of blood flow towards the liver. Other: Limited study due to intubation. IMPRESSION: Increased echogenicity liver compatible with fatty infiltration. No liver mass. Mild gallbladder sludge.  No biliary dilatation. Electronically Signed   By: Franchot Gallo M.D.   On: 07/14/2020 14:28

## 2020-07-16 NOTE — Progress Notes (Signed)
COVID swab obtained today was negative.  Is to be retested again on 07/19/2020 and remain on airborne/contact isolation during this period.

## 2020-07-16 NOTE — Op Note (Signed)
06/28/2020  2:27 PM  PATIENT:  Omar Todd  69 y.o. male  PRE-OPERATIVE DIAGNOSIS:  [place] DIAGNOSIS:  [place]:  Procedure(s): DEBRIDEMENT OF SOFT TISSUE INFECTION (N/A)  SURGEON:  Surgeon(s) and Role:    * Ralene Ok, MD - Primary  ANESTHESIA:   general  EBL:  minimal   BLOOD ADMINISTERED:none  DRAINS: none   LOCAL MEDICATIONS USED:  NONE  SPECIMEN:  No Specimen  DISPOSITION OF SPECIMEN:  N/A  COUNTS:  YES  TOURNIQUET:  * No tourniquets in log *  DICTATION: .Dragon Dictation Indication procedure: Patient is a 69 year old male with admitted secondary to a necrotizing soft tissue infection.  Patient underwent 2 previous debridements and irrigations.  Patient returned to the operating room for third look operation.  Findings: Patient had small areas of some necrosis however there was no overt fascia necrosis.  There is no spreading the necrosis.  Quarter percent Dakin solution was soaked onto Kerlix and packed into the wound.  Details of procedure: The patient was consented he was taken back to the OR placed in supine position with bilateral SCDs in place.  Patient was intubated previously to the OR arrival.  Patient was placed under general anesthesia.  Patient was then prepped and draped sterile fashion.  A timeout was called all facts verified.  At this time I was able to visualize the tissue there appeared to be an area to the left medial gluteus that was somewhat necrotic.  This tissue was debrided.  This was approximately 2 x 2 cm.  All other areas appear to be of any further necrosis.  The edges appear to be well perfused.  The base of the wound appeared to be well perfused.  At this time the area was entirely irrigated out with pulse lavage.  Quarter percent Dakin solution was used to soak a Kerlix gauze and this was placed into the wound.  This was dressed with ABD pad and tape.  Patient taught  the procedure well was taken to the ICU intubated and in stable condition.  Excisional debridement:  Tool used for debridement -cautery   Frequency of surgical debridement.   Third debridement   Measurement of total devitalized tissue (wound surface) before and after surgical debridement.   20 x 20 cm  Area and depth of devitalized tissue removed from wound.  2 x 2 cm  Blood loss and description of tissue removed.  10 cc  Evidence of the progress of the wound's response to treatment.             A.  Current wound volume (current dimensions and depth).  20 x 20 cm             B.  Presence (and extent of) of infection.  Yes, 2 x 2 cm             C.  Presence (and extent of) of non viable tissue.  yes 2 x 2 cm             D.  Other material in the wound that is expected to inhibit healing.  None  Was there any viable tissue removed (measurements): No   PLAN OF CARE: Admit to inpatient   PATIENT DISPOSITION:  PACU - hemodynamically stable.   Delay start of Pharmacological VTE agent (>24hrs) due to surgical blood loss or risk of bleeding: no

## 2020-07-16 NOTE — TOC Progression Note (Signed)
Transition of Care [facility]) - Progression Note    Patient Details  Name: Omar Todd MRN: [id number] Date of Birth: 23-Nov-1950  Transition of [facility]) CM/SW Contact  Leeroy Cha, RN Phone Number: 06/28/2020, 8:17 AM  Clinical Narrative:    Pt taken to or on 07/12/2020 for I and D of the rt scrotum and inner thigh due to necrotizing fasciitis.  Vent to 40% fi02, iv maxipime,cleocin, iv sedation, iv lr at 100cc/hr, iv press=ors, iv vancoready. Bld cultures x2x4d=neg, wbc -15.0, hgb 6.7. Following for progression and toc needs may need snf placment due to large wound area and placement.        Expected Discharge Plan and Services                                                 Social Determinants of Health (SDOH) Interventions    Readmission Risk Interventions Readmission Risk Prevention Plan 05/18/2020  Transportation Screening Complete  PCP or Specialist Appt within 5-7 Days Not Complete  Not Complete comments plan for SNF  PCP or Specialist Appt within 3-5 Days Not Complete  Not Complete comments plan for SNF  Home Care Screening Complete  Medication Review (RN CM) Referral to Pharmacy  HRI or [place] Complete  Social Work Consult for [place] Planning/Counseling Complete  Palliative Care Screening Not Complete  Palliative Care Screening Not Complete Comments could be appropriate  Medication Review (RN Transport planner) Referral to Pharmacy  Some recent data might be hidden

## 2020-07-16 NOTE — Progress Notes (Signed)
Pharmacy Antibiotic Note  Omar Todd is a 69 y.o. male admitted on 06/21/2020 with sepsis, large sacral decubitus ulcer.  Patient is s/p OR debridement 8/23 and second look and I&D 8/25.  Pharmacy has been consulted for Vancomycin and Cefepime dosing. Pt also on Clindamycin.  Day #4 abx - Tm 99.7 - WBC 15, improved - SCr 0.62, stable  Plan: Continue Cefepime 2gm IV q8h, Clindamycin 600mg  IV q6h Vancomycin 750 mg IV Q 8 hrs - check trough in AM, goal 15-20 mcg/ml.  Will f/u renal function, micro data, and pt's clinical condition   Height: 6\' 2"  (188 cm) Weight: 84.5 kg (186 lb 4.6 oz) IBW/kg (Calculated) : 82.2  Temp (24hrs), Avg:98.8 F (37.1 C), Min:98.3 F (36.8 C), Max:99.7 F (37.6 C)  Recent Labs  Lab 07/15/2020 1800 07/21/2020 1800 07/21/2020 1802 06/24/2020 2005 07/18/2020 2209 07/14/2020 0551 07/14/20 0817 07/14/20 1730 07/08/2020 0355 06/27/2020 0425  WBC 31.5*  --   --   --   --  32.1* 22.9*  --  17.9* 15.0*  CREATININE 0.62   < >  --   --   --  0.49* 0.61 0.53* 0.63 0.62  LATICACIDVEN  --   --  1.4 2.1* 1.7  --   --   --   --   --    < > = values in this interval not displayed.    Estimated Creatinine Clearance: 101.3 mL/min (by C-G formula based on SCr of 0.62 mg/dL).    Allergies  Allergen Reactions  . Penicillins Other (See Comments)    Pt. States he "passed out"    Antimicrobials this admission:  8/22 Cipro x 1 8/22 Clinda x 1, resume 8/23 >> 8/22 Vancomycin >> 8/23 Metronidazole x 2 8/23 Cefepime >>   Dose adjustments this admission:  8/27 VT at 05:30 = ___ on 750mg  q8h  Microbiology results:  8/22 BCx: ngtd 8/22 UCx:  multiple species (F) 8/22 Wound from ulcer cxs: multiple organisms, no staph, no group A strep 8/23 Sacrum abscess: multiple organisms, no staph, no group A strep 8/23 Ischial wound: mod GPC, few GPR, few GNR - reincubating 8/23 AKA stump: rare, GPC - reincubating 8/23 Inguinal wound: abundant GPC in pairs, mod GPR, few GNR -  reincubating 8/24 MRSA PCR: positive  Thank you for allowing pharmacy to be a part of this patient's care.  Peggyann Juba, PharmD, BCPS Pharmacy: [phone] 07/17/2020 7:32 AM

## 2020-07-17 ENCOUNTER — Inpatient Hospital Stay (HOSPITAL_COMMUNITY): Payer: Medicare Other

## 2020-07-17 ENCOUNTER — Encounter (HOSPITAL_COMMUNITY): Payer: Self-pay | Admitting: General Surgery

## 2020-07-17 LAB — VANCOMYCIN, TROUGH: Vancomycin Tr: 13 ug/mL — ABNORMAL LOW (ref 15–20)

## 2020-07-17 LAB — TYPE AND SCREEN
ABO/RH(D): A POS
ABO/RH(D): A POS
Antibody Screen: NEGATIVE
Antibody Screen: NEGATIVE
Unit division: 0
Unit division: 0
Unit division: 0

## 2020-07-17 LAB — COMPREHENSIVE METABOLIC PANEL
ALT: 172 U/L — ABNORMAL HIGH (ref 0–44)
AST: 149 U/L — ABNORMAL HIGH (ref 15–41)
Albumin: 1.1 g/dL — ABNORMAL LOW (ref 3.5–5.0)
Alkaline Phosphatase: 139 U/L — ABNORMAL HIGH (ref 38–126)
Anion gap: 6 (ref 5–15)
BUN: 26 mg/dL — ABNORMAL HIGH (ref 8–23)
CO2: 24 mmol/L (ref 22–32)
Calcium: 7.2 mg/dL — ABNORMAL LOW (ref 8.9–10.3)
Chloride: 111 mmol/L (ref 98–111)
Creatinine, Ser: 0.55 mg/dL — ABNORMAL LOW (ref 0.61–1.24)
GFR calc Af Amer: >60 mL/min (ref >60)
GFR calc non Af Amer: >60 mL/min (ref >60)
Glucose, Bld: 198 mg/dL — ABNORMAL HIGH (ref 70–99)
Potassium: 3.9 mmol/L (ref 3.5–5.1)
Sodium: 141 mmol/L (ref 135–145)
Total Bilirubin: 0.6 mg/dL (ref 0.3–1.2)
Total Protein: 4.1 g/dL — ABNORMAL LOW (ref 6.5–8.1)

## 2020-07-17 LAB — LACTIC ACID, PLASMA: Lactic Acid, Venous: 1.3 mmol/L (ref 0.5–1.9)

## 2020-07-17 LAB — CULTURE, BLOOD (ROUTINE X 2)
Culture: NO GROWTH
Culture: NO GROWTH
Special Requests: ADEQUATE
Special Requests: ADEQUATE

## 2020-07-17 LAB — BPAM RBC
Blood Product Expiration Date: 202109092359
Blood Product Expiration Date: 202109092359
Blood Product Expiration Date: 202109152359
ISSUE DATE / TIME: 202108241049
ISSUE DATE / TIME: 202108261222
Unit Type and Rh: 6200
Unit Type and Rh: 6200
Unit Type and Rh: 6200

## 2020-07-17 LAB — GLUCOSE, CAPILLARY
Glucose-Capillary: 179 mg/dL — ABNORMAL HIGH (ref 70–99)
Glucose-Capillary: 191 mg/dL — ABNORMAL HIGH (ref 70–99)
Glucose-Capillary: 204 mg/dL — ABNORMAL HIGH (ref 70–99)
Glucose-Capillary: 203 mg/dL — ABNORMAL HIGH (ref 70–99)
Glucose-Capillary: 201 mg/dL — ABNORMAL HIGH (ref 70–99)
Glucose-Capillary: 195 mg/dL — ABNORMAL HIGH (ref 70–99)

## 2020-07-17 LAB — CBC
HCT: 25.2 % — ABNORMAL LOW (ref 39.0–52.0)
Hemoglobin: 7.6 g/dL — ABNORMAL LOW (ref 13.0–17.0)
MCH: 25.2 pg — ABNORMAL LOW (ref 26.0–34.0)
MCHC: 30.2 g/dL (ref 30.0–36.0)
MCV: 83.7 fL (ref 80.0–100.0)
Platelets: 170 10*3/uL (ref 150–400)
RBC: 3.01 MIL/uL — ABNORMAL LOW (ref 4.22–5.81)
RDW: 18.1 % — ABNORMAL HIGH (ref 11.5–15.5)
WBC: 16 10*3/uL — ABNORMAL HIGH (ref 4.0–10.5)
nRBC: 0.2 % (ref 0.0–0.2)

## 2020-07-17 LAB — TRIGLYCERIDES: Triglycerides: 145 mg/dL (ref <150)

## 2020-07-17 LAB — PHOSPHORUS: Phosphorus: 2 mg/dL — ABNORMAL LOW (ref 2.5–4.6)

## 2020-07-17 LAB — MAGNESIUM: Magnesium: 2 mg/dL (ref 1.7–2.4)

## 2020-07-17 IMAGING — DX DG ABDOMEN 1V
1 series · 1 of 1 positions shown · non-contrast
Comparison: None.

CLINICAL DATA: Initial evaluation for NG tube placement.

EXAM:
ABDOMEN - 1 VIEW

[abdomen kub]
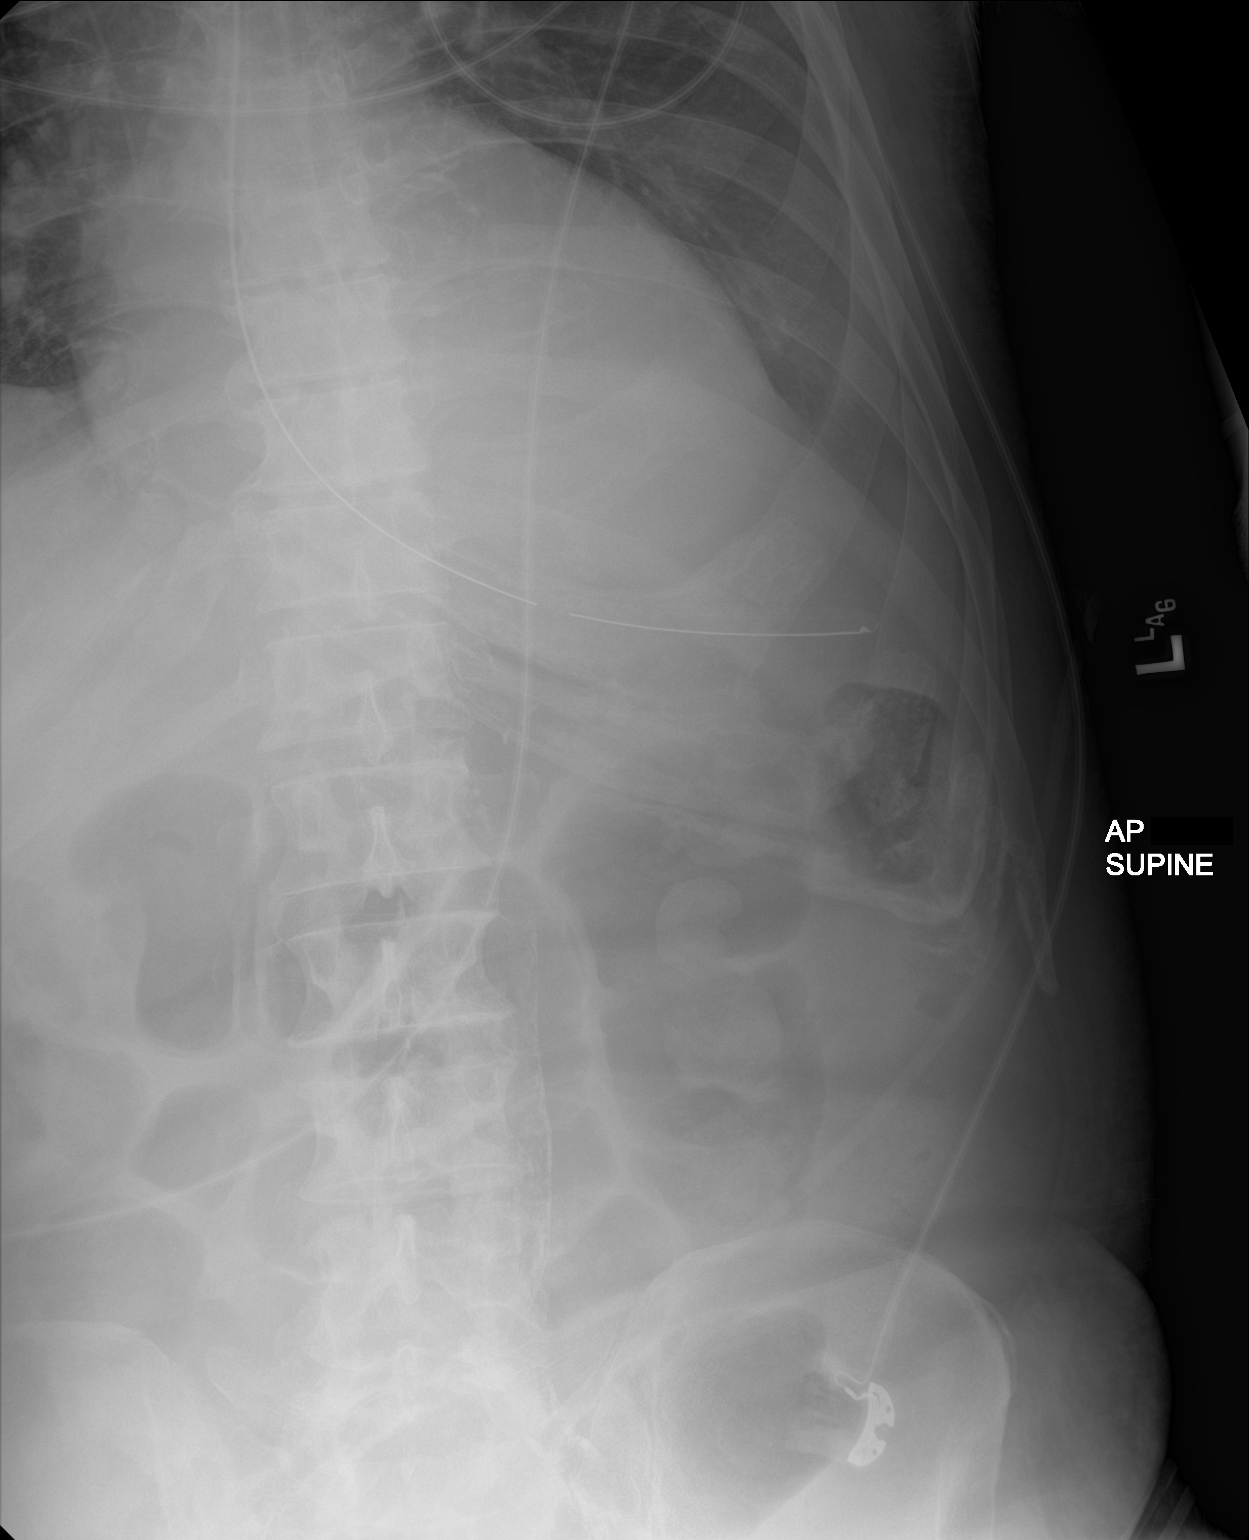

[1 of 1 positions shown; findings below may reference images not displayed]

FINDINGS: Enteric tube in place with tip overlying the stomach, side hole
beyond the GE junction. Visualized bowel gas pattern is
nonobstructive. Prominent vascular calcifications noted within the
infrarenal aorta. Veiling opacity overlying left hemidiaphragm
suggestive of small left pleural effusion.
IMPRESSION: 1. Enteric tube in place with tip overlying the stomach, side hole
beyond the GE junction.
2. Small left pleural effusion.
3.  Aortic Atherosclerosis ([96]-[96]).

## 2020-07-17 IMAGING — DX DG CHEST 1V PORT
1 series · 2 of 2 positions shown · non-contrast
Comparison: [DATE].

CLINICAL DATA: Intubation.

EXAM:
PORTABLE CHEST 1 VIEW

[Series 1: chest ap · 0.14mm/px · 2 of 2 slices shown]
[im 1/2]
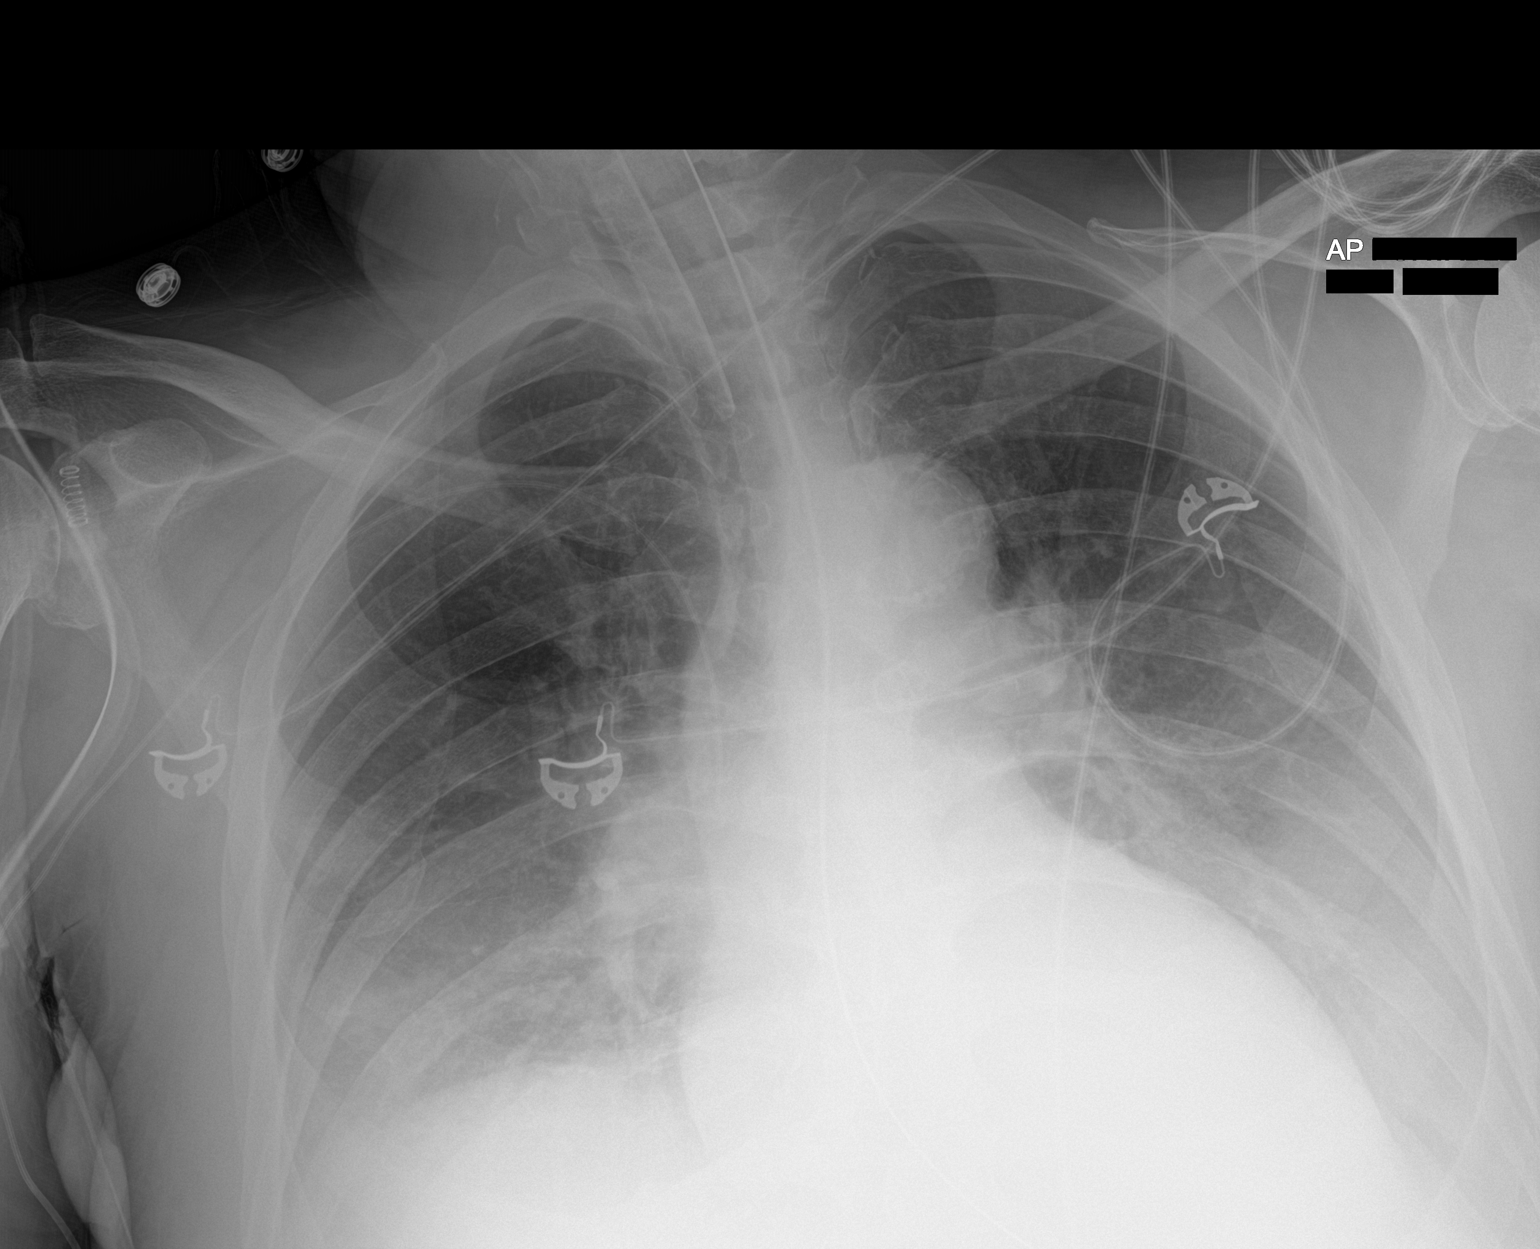
[im 2/2]
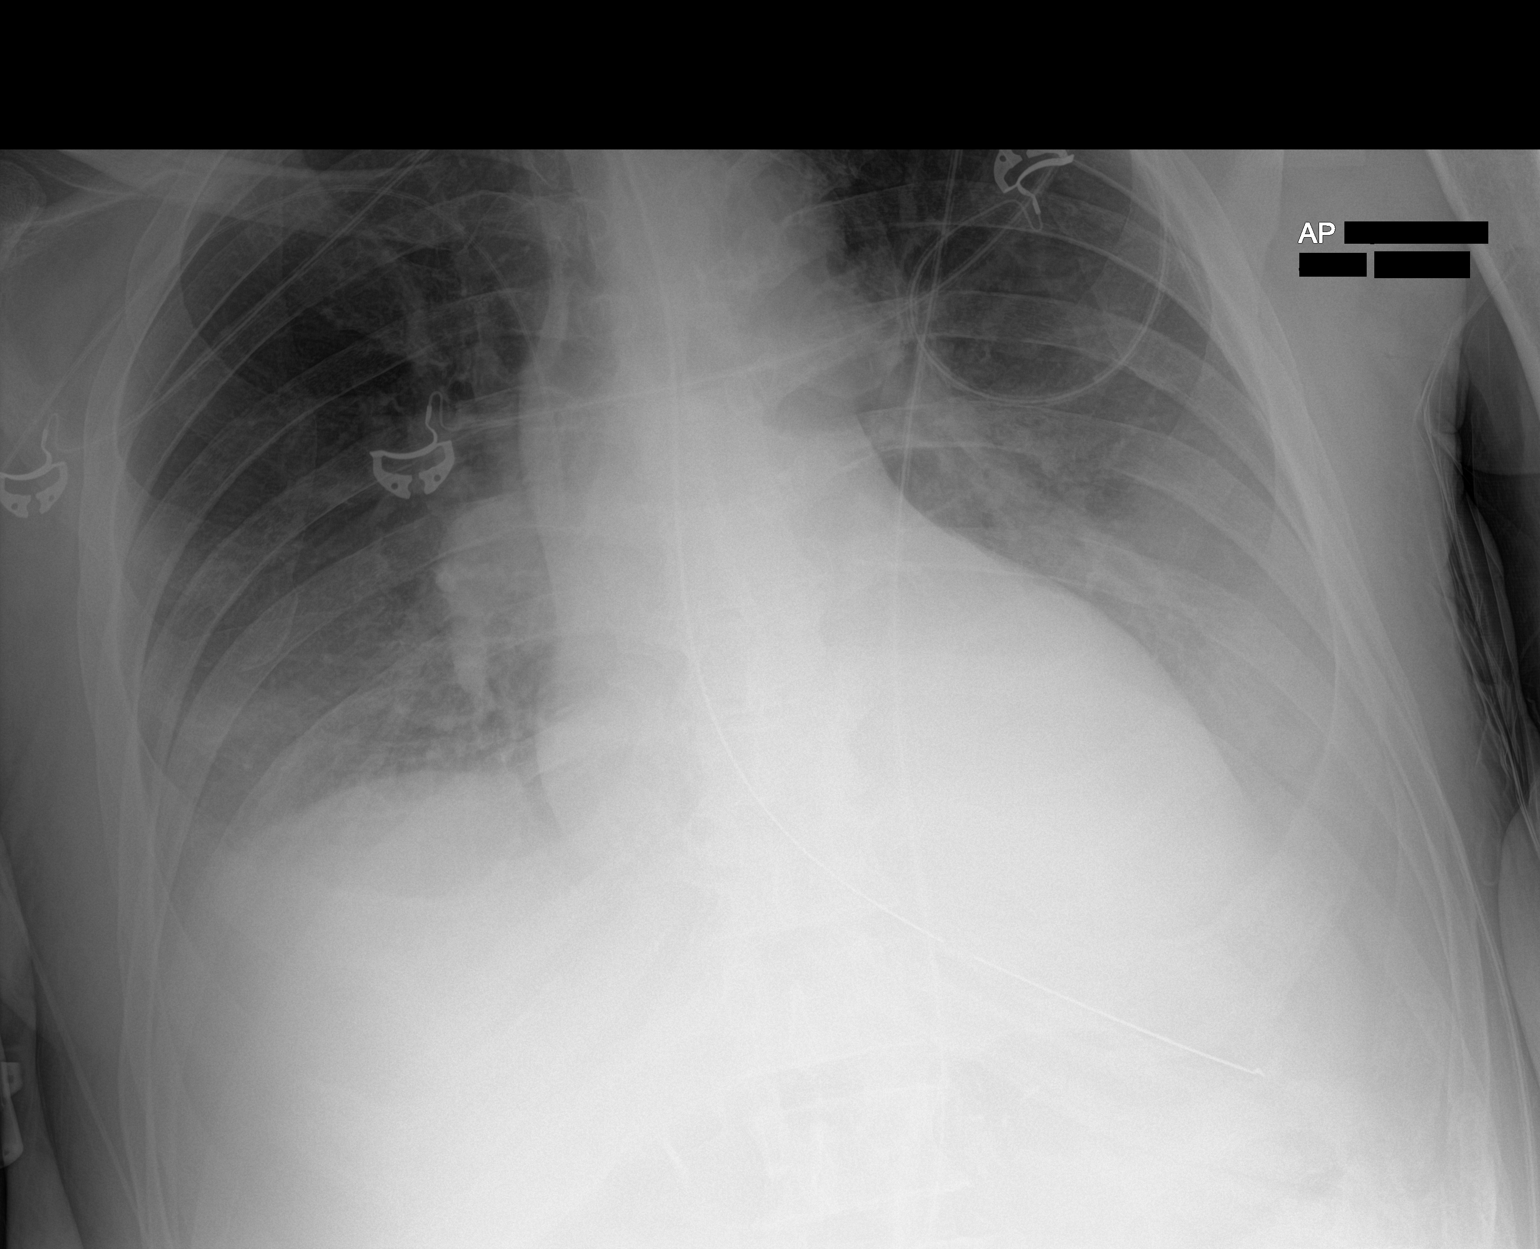

[2 of 2 positions shown; findings below may reference images not displayed]

FINDINGS: Endotracheal tube, NG tube, right PICC line stable position. Stable
cardiomegaly. Unchanged bibasilar infiltrates/edema. Persistent left
pleural effusion. Right pleural effusion noted on today's exam. No
pneumothorax. Carotid vascular calcification.
IMPRESSION: 1.  Lines and tubes in stable position.

2.  Stable cardiomegaly.

3. Unchanged bibasilar infiltrates/edema. Persistent left pleural
effusion. Right pleural effusion noted on today's exam.

## 2020-07-17 MED ORDER — VANCOMYCIN HCL IN DEXTROSE 1-5 GM/200ML-% IV SOLN
1000.0000 mg | Freq: Three times a day (TID) | INTRAVENOUS | Status: DC
  Administered 2020-07-17 – 2020-07-19 (×6): 1000 mg via INTRAVENOUS
  Filled 2020-07-17 (×7): qty 200

## 2020-07-17 MED ORDER — SODIUM CHLORIDE 0.9 % IV SOLN
2.0000 g | INTRAVENOUS | Status: DC
  Administered 2020-07-17 – 2020-07-26 (×9): 2 g via INTRAVENOUS
  Filled 2020-07-17 (×2): qty 2
  Filled 2020-07-17 (×2): qty 20
  Filled 2020-07-17: qty 2
  Filled 2020-07-17 (×2): qty 20
  Filled 2020-07-17: qty 2
  Filled 2020-07-17 (×2): qty 20

## 2020-07-17 MED ORDER — POTASSIUM & SODIUM PHOSPHATES 280-160-250 MG PO PACK
2.0000 | PACK | Freq: Three times a day (TID) | ORAL | Status: AC
  Administered 2020-07-17 (×2): 2 via ORAL
  Filled 2020-07-17 (×3): qty 2

## 2020-07-17 NOTE — Progress Notes (Addendum)
[facility] Progress Note  1 Day Post-Op  Subjective: Sedated on the vent. Dressing changed with RNs.   Objective: Vital signs in last 24 hours: Temp:  [98.2 F (36.8 C)-101 F (38.3 C)] 99.2 F (37.3 C) (08/27 0400) Pulse Rate:  [63-86] 66 (08/27 0600) Resp:  [9-19] 14 (08/27 0600) BP: (98-130)/(40-60) 115/43 (08/27 0600) SpO2:  [88 %-97 %] 95 % (08/27 0600) FiO2 (%):  [30 %-50 %] 30 % (08/27 0329) Last BM Date: 07/06/2020  Intake/Output from previous day: 08/26 0701 - 08/27 0700 In: 3915.2 [I.V.:2660; Blood:315; NG/GT:265; IV Piggyback:675.1] Out: 1490 [Urine:1475; Blood:15] Intake/Output this shift: No intake/output data recorded.  PE: General: sedated on the vent  Heart: regular, rate, and rhythm Lungs: ventilator, rales bilaterally  Abd: soft, NT, ND, +BS GU: perineal wound as below - I think darkened areas are likely from cautery; not much purulence, no significant bleeding  MS: R AKA with some erythema around open portions, packing changed   Lab Results:  Recent Labs    06/29/2020 0425 07/17/20 0457  WBC 15.0* 16.0*  HGB 6.7* 7.6*  HCT 23.4* 25.2*  PLT 195 170   BMET Recent Labs    07/07/2020 0425 07/17/20 0457  NA 139 141  K 3.8 3.9  CL 110 111  CO2 23 24  GLUCOSE 214* 198*  BUN 27* 26*  CREATININE 0.62 0.55*  CALCIUM 7.2* 7.2*   PT/INR No results for input(s): LABPROT, INR in the last 72 hours. CMP     Component Value Date/Time   NA 141 07/17/2020 0457   NA 130 (L) 02/28/2020 1552   K 3.9 07/17/2020 0457   CL 111 07/17/2020 0457   CO2 24 07/17/2020 0457   GLUCOSE 198 (H) 07/17/2020 0457   BUN 26 (H) 07/17/2020 0457   BUN 13 02/28/2020 1552   CREATININE 0.55 (L) 07/17/2020 0457   CREATININE 0.88 04/11/2013 0000   CALCIUM 7.2 (L) 07/17/2020 0457   PROT 4.1 (L) 07/17/2020 0457   PROT 6.6 08/08/2018 1126   ALBUMIN 1.1 (L) 07/17/2020 0457   ALBUMIN 4.4 08/08/2018 1126   AST 149 (H) 07/17/2020 0457   ALT 172 (H) 07/17/2020  0457   ALKPHOS 139 (H) 07/17/2020 0457   BILITOT 0.6 07/17/2020 0457   BILITOT <0.2 08/08/2018 1126   GFRNONAA >60 07/17/2020 0457   GFRNONAA >89 04/11/2013 0000   GFRAA >60 07/17/2020 0457   GFRAA >89 04/11/2013 0000   Lipase  No results found for: LIPASE     Studies/Results: DG Abd 1 View  Result Date: 07/17/2020 CLINICAL DATA:  Initial evaluation for NG tube placement. EXAM: ABDOMEN - 1 VIEW COMPARISON:  None. FINDINGS: Enteric tube in place with tip overlying the stomach, side hole beyond the GE junction. Visualized bowel gas pattern is nonobstructive. Prominent vascular calcifications noted within the infrarenal aorta. Veiling opacity overlying left hemidiaphragm suggestive of small left pleural effusion. IMPRESSION: 1. Enteric tube in place with tip overlying the stomach, side hole beyond the GE junction. 2. Small left pleural effusion. 3.  Aortic Atherosclerosis (ICD10-I70.0). Electronically Signed   By: Jeannine Boga M.D.   On: 07/17/2020 01:41   DG CHEST PORT 1 VIEW  Result Date: 07/17/2020 CLINICAL DATA:  Intubation. EXAM: PORTABLE CHEST 1 VIEW COMPARISON:  07/01/2020. FINDINGS: Endotracheal tube, NG tube, right PICC line stable position. Stable cardiomegaly. Unchanged bibasilar infiltrates/edema. Persistent left pleural effusion. Right pleural effusion noted on today's exam. No pneumothorax. Carotid vascular calcification. IMPRESSION: 1.  Lines and tubes in  stable position. 2.  Stable cardiomegaly. 3. Unchanged bibasilar infiltrates/edema. Persistent left pleural effusion. Right pleural effusion noted on today's exam. Electronically Signed   By: Marcello Moores  Register   On: 07/17/2020 06:41   DG CHEST PORT 1 VIEW  Result Date: 07/10/2020 CLINICAL DATA:  Respiratory failure. EXAM: PORTABLE CHEST 1 VIEW COMPARISON:  07/02/2020. FINDINGS: Endotracheal tube, NG tube, right PICC line stable position. Stable cardiomegaly. Unchanged bibasilar infiltrates/edema. Unchanged small left  pleural effusion. No pneumothorax. IMPRESSION: 1.  Lines and tubes in stable position. 2.  Stable cardiomegaly. 3. Unchanged bibasilar infiltrates/edema. Unchanged small left pleural effusion. Electronically Signed   By: Marcello Moores  Register   On: 07/21/2020 06:21    Anti-infectives: Anti-infectives (From admission, onward)   Start     Dose/Rate Route Frequency Ordered Stop   07/02/2020 1600  ceFEPIme (MAXIPIME) 2 g in sodium chloride 0.9 % 100 mL IVPB        2 g 200 mL/hr over 30 Minutes Intravenous Every 8 hours 07/19/2020 1537     07/08/2020 1500  clindamycin (CLEOCIN) IVPB 600 mg        600 mg 100 mL/hr over 30 Minutes Intravenous Every 6 hours 07/08/2020 1337     06/24/2020 0600  vancomycin (VANCOREADY) IVPB 750 mg/150 mL        750 mg 150 mL/hr over 60 Minutes Intravenous Every 8 hours 07/11/2020 2310     06/28/2020 0200  metroNIDAZOLE (FLAGYL) IVPB 500 mg  Status:  Discontinued        500 mg 100 mL/hr over 60 Minutes Intravenous Every 8 hours 07/15/2020 2220 07/01/2020 1337   07/04/2020 2315  ceFEPIme (MAXIPIME) 2 g in sodium chloride 0.9 % 100 mL IVPB  Status:  Discontinued        2 g 200 mL/hr over 30 Minutes Intravenous Every 8 hours 06/29/2020 2310 07/07/2020 1537   07/08/2020 2000  vancomycin (VANCOREADY) IVPB 1500 mg/300 mL        1,500 mg 150 mL/hr over 120 Minutes Intravenous  Once 06/23/2020 1834 06/28/2020 2326   06/30/2020 1800  vancomycin (VANCOCIN) IVPB 1000 mg/200 mL premix  Status:  Discontinued        1,000 mg 200 mL/hr over 60 Minutes Intravenous  Once 07/03/2020 1757 07/16/2020 1834   07/17/2020 1800  clindamycin (CLEOCIN) IVPB 600 mg        600 mg 100 mL/hr over 30 Minutes Intravenous  Once 06/24/2020 1759 07/04/2020 1913   07/15/2020 1800  ciprofloxacin (CIPRO) IVPB 400 mg        400 mg 200 mL/hr over 60 Minutes Intravenous  Once 06/29/2020 1759 07/05/2020 2039       Assessment/Plan Sepsis with necrotizing fasciitis/hypotension Ventilator dependent respiratory failure Anemia COPD Type 2 diabetes  with neuropathy Hypertension Elevated transaminase  - AST 1151/ALT 452 COVID-19 exposure - family member tested positive, retest per ID, continue precautions per ID  Right AKA 7/30  - Dr. Sharol Given Right above-the-knee amputation incision infection S/p debridement by Dr. Constance Haw 07/11/2020 - management per Dr. 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 Constance Haw 06/21/2020 S/p debridement by Dr. Rosendo Gros and Dr. Diona Fanti 07/15/20 S/p debridement by Dr. Rosendo Gros 07/16/20 - dressing changed at bedside this AM, continue Dakin's today, possibly switch to NS tomorrow for dressing changes - cx from ischial wound pending  - WBC 16, afebrile - continue to off-load pressure as able - patient will likely need diverting loop colostomy next week to  allow for wound healing - I called and discussed this with patient's sister, [name], who is agreeable to this plan   FEN: TF, IVF, NPO ID: Cipro x 18/22;  Flagyl x 2 8/23; Vancomycin 8/22>>, Maxipime/Clindamycin 8/23 >>    DVT:  SCD's   LOS: 5 days    Norm Parcel , [facility] 07/17/2020, 8:12 AM Please see Amion for pager number during day hours 7:00am-4:30pm

## 2020-07-17 NOTE — Progress Notes (Signed)
Inpatient Diabetes Program Recommendations  AACE/ADA: New Consensus Statement on Inpatient Glycemic Control (2015)  Target Ranges:  Prepandial:   less than 140 mg/dL      Peak postprandial:   less than 180 mg/dL (1-2 hours)      Critically ill patients:  140 - 180 mg/dL   Lab Results  Component Value Date   GLUCAP 191 (H) 07/17/2020   HGBA1C 8.3 (H) 05/15/2020    Review of Glycemic Control Results for Omar Todd, Omar Todd (MRN [id number]) as of 07/17/2020 10:29  Ref. Range 06/24/2020 08:03 07/12/2020 12:07 07/07/2020 16:43 07/03/2020 19:47 07/11/2020 23:36 07/17/2020 04:07 07/17/2020 08:25  Glucose-Capillary Latest Ref Range: 70 - 99 mg/dL 203 (H) 195 (H) 205 (H) 192 (H) 173 (H) 179 (H) 191 (H)   Diabetes history: DM 2 Outpatient Diabetes medications: Jardiance 25 mg Daily, metformin 1000 mg bid, Januvia 100 mg Daily Current orders for Inpatient glycemic control:  Novolog 0-9 units Q4 hours  Vital AF 1.2 CAL 50 ml/hour  Inpatient Diabetes Program Recommendations:    - may add Novolog 2 units Q4 hours tube feed coverage if glucose trends become elevated.  Thanks,  Tama Headings RN, MSN, BC-ADM Inpatient Diabetes Coordinator Team Pager [phone] (8a-5p)

## 2020-07-17 NOTE — Progress Notes (Signed)
Pharmacy Antibiotic Note  Omar Todd is a 69 y.o. male admitted on 06/23/2020 with sepsis, large sacral decubitus ulcer, necrotizing fasciitis.  Patient is s/p OR debridement 8/23 and second look and I&D 8/25, third look with I&D on 8/26.  Pharmacy has been consulted for Vancomycin and Cefepime dosing. Pt also on Clindamycin.  Day #5 abx - Tm 101 - WBC 16 - SCr 0.55, stable - vancomycin trough low  Plan: Continue Cefepime 2gm IV q8h, Clindamycin 600mg  IV q6h Increase to Vancomycin 1000 mg IV Q 8 hrs Check vanc trough at new steady state.  Goal 15-20 mcg/ml.  Will f/u renal function, micro data, and pt's clinical condition   Height: 6\' 2"  (188 cm) Weight: 84.5 kg (186 lb 4.6 oz) IBW/kg (Calculated) : 82.2  Temp (24hrs), Avg:99.7 F (37.6 C), Min:98.2 F (36.8 C), Max:101 F (38.3 C)  Recent Labs  Lab 07/08/2020 1800 06/29/2020 1802 07/04/2020 2005 07/16/2020 2209 06/24/2020 0551 07/05/2020 0551 07/14/20 0817 07/14/20 1730 07/13/2020 0355 06/27/2020 0425 07/17/20 0457  WBC   < >  --   --   --  32.1*  --  22.9*  --  17.9* 15.0* 16.0*  CREATININE   < >  --   --   --  0.49*   < > 0.61 0.53* 0.63 0.62 0.55*  LATICACIDVEN  --  1.4 2.1* 1.7  --   --   --   --   --   --  1.3   < > = values in this interval not displayed.    Estimated Creatinine Clearance: 101.3 mL/min (A) (by C-G formula based on SCr of 0.55 mg/dL (L)).    Allergies  Allergen Reactions  . Penicillins Other (See Comments)    Pt. States he "passed out"    Antimicrobials this admission:  8/22 Cipro x 1 8/22 Clinda x 1, resume 8/23 >> 8/22 Vancomycin >> 8/23 Metronidazole x 2 8/23 Cefepime >>   Dose adjustments this admission:  8/27 VT at 05:30 = 13 on 750mg  q8h  Microbiology results:  8/22 BCx: ngtd 8/22 UCx:  multiple species (F) 8/22 Wound from ulcer cxs: multiple organisms, no staph, no group A strep 8/23 Sacrum abscess: multiple organisms, no staph, no group A strep 8/23 Ischial wound: mod GPC, few GPR,  few GNR - reincubating 8/23 AKA stump: rare MRSA, rare GBS 8/23 Inguinal wound: rare Klebsiella oxytoca, rare Streptococcus constellatus 8/24 MRSA PCR: positive  Thank you for allowing pharmacy to be a part of this patient's care.  Gretta Arab PharmD, BCPS Clinical Pharmacist [facility] main pharmacy [phone] 07/17/2020 7:12 AM

## 2020-07-17 NOTE — Progress Notes (Signed)
NAME:  Omar Todd, MRN:  [id number], DOB:  [birth year], LOS: 5 ADMISSION DATE:  06/25/2020, CONSULTATION DATE:  07/14/20 REFERRING MD:  [name] trnasfer, CHIEF COMPLAINT:  [name] History    30 pack current smoker with DM, COPD Nos, BP, lipid, GERD. 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 At some pont also has stage 2 sacral decub "Size of a quarter"- described at time of this discharge. Discharged to ? Rehab and then signed out ? AMA from rehab.   Readimmited to [facility] 07/21/2020 with sepsis syndrome(fever + SIRS with lactate 2.1) with growing. Rt buttock ulcer noted to be stage 3 large wth fecal soiling but R AKA stump incision well healed but  By 07/21/2020 purulent drainage noted. CT pelvis c/w necrotizing fasciitis (with sub cut gas) in right perineal soft tissue and bedside exam was concerning for crepitus and concern for Fournier gangrene. Patient noticed to be disoriented by 07/08/2020  On 07/12/2020 Hervey Ard excisional debridement of right buttock, right inguinal region, and right scrotal skin measuring (right buttock / right inguinal wound 20cmX10cmX4cm and right scrotal skin 15cmX8cmX2cm); Drainage and packing of Right AKA wound infection  -. 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 Concern for need for repeated debridement and urology involvement and patient transferred to [facility] from [facility] on 07/14/20. Upon arrival getting PRBC for hgb < 7gm% and sedatedh diprivan gtt and fent gtt on ventilator needing neo for bp support.    Decision maker:  he has a wife at [facility] with dementia and estranged children he has not seen since they were babies.Gershon Shorten [phone]  She is his closest  relative and decision maker. She does not have health care power of attorney.    Past Medical History     has a past medical history of COPD (chronic obstructive pulmonary disease) ([place]), Diabetes mellitus without complication ([place]), Gangrene of toe of right foot ([place]), GERD (gastroesophageal reflux disease), Hyperlipidemia, and Hypertension.   reports that he has been smoking cigarettes. He has a 30.00 pack-year smoking history. He has never used smokeless tobacco.   has a past surgical history that includes Thumb surgery; Esophagogastroduodenoscopy (N/A, 03/13/2020); biopsy (03/13/2020); Flexible sigmoidoscopy (N/A, 05/06/2020); Amputation (Right, 05/17/2020); Application if wound vac (Right, 05/17/2020); Amputation (Right, 06/19/2020); Incision and drainage of wound (Right, 07/12/2020); Incision and drainage (Right, 07/02/2020); and Incision and drainage abscess (N/A, 06/23/2020).   Significant Hospital Events   06/30/2020 - [place] admit 07/14/20  - transfer to [facility] 8/25 - s. I and D and debridement of Rt Gluteal Nec Fasc by CCS and ->   Inspection of wound, debridement of right testicle/cord, scrotal wound edge ( excised tissue approximately 2 x 4 cm) by Urology. On neo gtt, diprivan gtt, fent gtt . On vent 40% . Not on TF (yet). Ortho Dr Sharol Given recmmended -> packing the  R AKA wound daily . Seen by Dr Gloriann Loan of Urology - currently R testicle exposed but viable tissue  Consults:  8/24 -   Procedures:  8/23 -  On 06/28/2020 Hervey Ard excisional debridement of right buttock, right inguinal region, and right scrotal skin measuring (right buttock / right inguinal wound  20cmX10cmX4cm and right scrotal skin 15cmX8cmX2cm); Drainage and packing of Right AKA wound infection ETT 823  RUE PIcc 8/24  Significant Diagnostic Tests:  x  Micro Data:   Microbiology results: 8/22 BCx:  8/22 UCx:   8/22 Wound cx   Antimicrobials:  Antimicrobials this admission: 8/22 Cipro x 1 8/22 Clinda x 8/22 Flagyl >>  ? Stop date 8/22 Vanc >> 8/22 Cefepime>>   Interim history/subjective:   8/26 - PRBC < 7 and 1 u prbc ordered. On vent. BAck to OR today, . Exposure to sister with covid + . Sister Inez Catalina the DPOA reported that she is covid positive 8/.25/2 but she has not been at bedside. However, rest of family fo her is is positive. There is sister [name] is the one who visited patient. This is not DPOA. [name] is awaiting  Test results. FiO2 30%, rate 15, 5 PEEP and 650 TV Followed commands on WUA 8/27 am Remains on fent gtt at 125 mcg, diprivan gtt at 30 mcg and neo gtt at 75 mcg via PICC line.  T max 101 WBC 16 HGB 7.6 Platelets 170,000 Lactate 1.3 Potassium 3.9 Mag 2 + 16 L since admission    Objective   Blood pressure (!) 107/41, pulse 62, temperature 99.9 F (37.7 C), temperature source Oral, resp. rate 14, height 6\' 2"  (1.88 m), weight 84.5 kg, SpO2 91 %.    Vent Mode: PRVC FiO2 (%):  [30 %-40 %] 30 % Set Rate:  [15 bmp] 15 bmp Vt Set:  [650 mL] 650 mL PEEP:  [5 cmH20] 5 cmH20 Plateau Pressure:  [15 cmH20-16 cmH20] 15 cmH20   Intake/Output Summary (Last 24 hours) at 07/17/2020 1307 Last data filed at 07/17/2020 0900 Gross per 24 hour  Intake 3507.28 ml  Output 1215 ml  Net 2292.28 ml   Filed Weights   07/14/2020 1643 07/14/20 1300 07/09/2020 0403  Weight: 75.4 kg 80.7 kg 84.5 kg   General Appearance:  Looks criticall ill , in NAD at present on vent Head:  Normocephalic, without obvious abnormality, atraumatic Eyes:  PERRL - yes, conjunctiva/corneas - muddy     Ears:  Normal external ear canals, both ears Nose:  G tube - no Throat:  ETT is secure and intact, OG tube is secure and intact Neck:  Supple,  No enlargement/tenderness/nodules Lungs: BIlateral chest excursion, Coarse rhonchi, , Ventilator   Synchrony - yes, thin tan secretions Heart:  S1 and S2 normal, RRR, no murmur, rub or gallop, CVP - x.  Pressors - yes Abdomen:  Soft, no masses, no organomegaly Genitalia / Rectal:   Rt test wiuth packing and foley + Extremities:  Extremities- Rt AKA with stump dressing and right groin dressing Skin:  intact in exposed areas . Sacral area - has decub, Right groin wound with drainage and redness, dressing changed. Right stump with dressing no drainage Neurologic:  Sedation - fent and diprivan -> RASS - -3 . Moves all 4s - yes. CAM-ICU - unable to test . Orientation - followed commands on WUA 8/27 am   Resolved Hospital Problem list   x  Assessment & Plan:  ASSESSMENT / PLAN:  PULMONARY  A:  Baseline COPD NOS Acute post op respiratory failure in setting of infected sacral ulcer and Fournier gangrene 07/08/2020.    07/17/2020 - > does not meet criteria for SBT/Extubation in setting of Acute Respiratory Failure due to surgery needs and Circulatory shock , able to wean to 30% FiO2: CXR 8/27 with unchanged bibasilar infiltrates/edema.  Persistent left pleural effusion. Right pleural effusion noted on today's exam  P:   Continue BD PRVC VAP bundle Leave intubated due to repeat needs to go to OR ABG prn Wean FiO2 and PEEP as able for sats of > 88% CXR prn Consider gentle diuresis once off pressors as BP allows   NEUROLOGIC A:   Acute encephalopathy in setting of sepsis 07/12/2020 and pre-op. Left sedated post op on ventilator  07/17/2020 - WUA done 8/27 am  Followed commands and MAE.  P:   RASS goal 0 to -2 Fent gtt Diprivan gtt Daily WUA   VASCULAR A:   Circulatory shock likely due to sepsis  07/17/2020 - Continue  neo   P:  MAP goal > 65 Leveophed or neo for pressor need  CARDIAC STRUCTURAL A: Moderate AS in April 2021 with EF 55% and LVH  P: Clinically monitor    INFECTIOUS A:   SEvere sepsis from R Fournier gangrene, RT ischial ulcer and Rt AKA re-infection  07/17/2020 - wbc down. Low grade fever + COVID exposure + 07/14/20 or 07/13/2020 from sister at bedside who tested positive 8/25 Remains febrile  P:   Broad abx as ordered Retest covid  pcr Culture as is clinically indicated   RENAL A:  At risk for AKI  07/17/2020 - Creat normal  P:  Fluids Avoid nephrotixins Maintain BP/HR Maintain renal perfusion  ELECTROLYTES A:  Electrolyte imbalance  07/17/2020 - Hypokalemia Hypomagnesemia Hypophosphatemia   P: Replete electrolytes as needed K goal > 4 Mag goal > 2 Phos goal - normal   GASTROINTESTINAL A:   AT risk stress ulcer Shock liver  07/17/2020 - RUQ US>> Increased echogenicity liver compatible with fatty infiltration. No liver mass. Mild gallbladder sludge.  No biliary dilatation.  P:   Protoonix Trend LFT's  HEMATOLOGIC   - HEME A:  Anemia of critical illness and chronic disease - s/p PRBC 07/14/20  07/17/2020 - stable   P:  -Monitor for bleeding -Transfuse for HGB per noted below - PRBC for hgb </= 6.9gm%    - exceptions are   -  if ACS susepcted/confirmed then transfuse for hgb </= 8.0gm%,  or    -  active bleeding with hemodynamic instability, then transfuse regardless of hemoglobin value   At at all times try to transfuse 1 unit prbc as possible with exception of active hemorrhage   ENDOCRINE A:   At risk for hypo and hyperglycemia    P:   icu hypergluycemiua protocol CBG Q4 SSI  MSK/DERM Rt AKA wound infection - on dressing and chaing RT sacral decub - large with nec fasc Rt Fournier gangrene   07/17/2020 s/p ID 07/20/2020 and going again 07/08/2020. New one reported left shoulder - DTI  Plan  - ortho, ccs and urology appreciate assistance   Best practice:  Diet: TF  Pain/Anxiety/Delirium protocol (if indicated): sedation protocol VAP protocol (if indicated): Bundle + DVT prophylaxis: Lovenox/Heparin GI prophylaxis: ppi Glucose control: ssi Mobility: bed rest Code Status: full code  Family Communication: Sister Chou Busler [phone]  -updated by RN and CCS. She is feeling down due to covid. Did not expose patient at least since 07/14/20  Disposition: [facility]  [facility]     [place], MSN, AGACNP-BC [place] for personal pager PCCM on call pager [phone]   07/17/2020 1:07 PM     [place]  Lab 07/14/2020  1850 07/14/20 1413  PHART 7.463* 7.392  PCO2ART 35.8 39.3  PO2ART 103 96.8  HCO3 26.0 23.4  O2SAT 98.2 97.6    CBC Recent Labs  Lab 07/20/2020 0355 07/02/2020 0425 07/17/20 0457  HGB 7.8* 6.7* 7.6*  HCT 26.3* 23.4* 25.2*  WBC 17.9* 15.0* 16.0*  PLT 276 195 170    COAGULATION Recent Labs  Lab 07/20/2020 1800  INR 1.3*    CARDIAC  No results for input(s): TROPONINI in the last 168 hours. No results for input(s): PROBNP in the last 168 hours.   CHEMISTRY Recent Labs  Lab 07/07/2020 1940 07/21/2020 0551 07/14/20 0817 07/14/20 0817 07/14/20 1730 07/14/20 1730 06/25/2020 0355 07/05/2020 0355 07/07/2020 0425 07/17/20 0457  NA  --    < > 137  --  139  --  139  --  139 141  K  --    < > 3.5   < > 3.2*   < > 4.5   < > 3.8 3.9  CL  --    < > 106  --  111  --  110  --  110 111  CO2  --    < > 22  --  22  --  21*  --  23 24  GLUCOSE  --    < > 139*  --  96  --  114*  --  214* 198*  BUN  --    < > 23  --  23  --  25*  --  27* 26*  CREATININE  --    < > 0.61  --  0.53*  --  0.63  --  0.62 0.55*  CALCIUM  --    < > 7.0*  --  6.6*  --  7.2*  --  7.2* 7.2*  MG 1.7  --   --   --   --   --   --   --  1.6* 2.0  PHOS  --   --   --   --   --   --   --   --  1.5* 2.0*   < > = values in this interval not displayed.   Estimated Creatinine Clearance: 101.3 mL/min (A) (by C-G formula based on SCr of 0.55 mg/dL (L)).   LIVER Recent Labs  Lab 07/18/2020 1800 07/03/2020 1800 07/14/20 0817 07/14/20 1730 07/17/2020 0355 06/23/2020 0425 07/17/20 0457  AST 28   < > 1,151* 660* 409* 182* 149*  ALT 24   < > 452* 359* 324* 197* 172*  ALKPHOS 117   < > 95 85 96 118 139*  BILITOT 0.9   < > 0.6 0.6 0.5 0.7 0.6  PROT 5.7*   < > 4.3* 3.9* 4.4* 3.8*  4.1*  ALBUMIN 2.1*   < > 1.4* 1.3* 1.4* 1.2* 1.1*  INR 1.3*  --   --   --   --   --   --    < > = values in this interval not displayed.     INFECTIOUS Recent Labs  Lab 07/10/2020 2005 07/01/2020 2209 07/17/20 0457  LATICACIDVEN 2.1* 1.7 1.3     ENDOCRINE CBG (last 3)  Recent Labs    07/17/20 0407 07/17/20 0825 07/17/20 1140  GLUCAP 179* 191* 204*         IMAGING x48h  - image(s) personally visualized  -   highlighted in bold DG Abd 1 View  Result Date: 07/17/2020 CLINICAL DATA:  Initial evaluation for NG  tube placement. EXAM: ABDOMEN - 1 VIEW COMPARISON:  None. FINDINGS: Enteric tube in place with tip overlying the stomach, side hole beyond the GE junction. Visualized bowel gas pattern is nonobstructive. Prominent vascular calcifications noted within the infrarenal aorta. Veiling opacity overlying left hemidiaphragm suggestive of small left pleural effusion. IMPRESSION: 1. Enteric tube in place with tip overlying the stomach, side hole beyond the GE junction. 2. Small left pleural effusion. 3.  Aortic Atherosclerosis (ICD10-I70.0). Electronically Signed   By: Jeannine Boga M.D.   On: 07/17/2020 01:41   DG CHEST PORT 1 VIEW  Result Date: 07/17/2020 CLINICAL DATA:  Intubation. EXAM: PORTABLE CHEST 1 VIEW COMPARISON:  07/07/2020. FINDINGS: Endotracheal tube, NG tube, right PICC line stable position. Stable cardiomegaly. Unchanged bibasilar infiltrates/edema. Persistent left pleural effusion. Right pleural effusion noted on today's exam. No pneumothorax. Carotid vascular calcification. IMPRESSION: 1.  Lines and tubes in stable position. 2.  Stable cardiomegaly. 3. Unchanged bibasilar infiltrates/edema. Persistent left pleural effusion. Right pleural effusion noted on today's exam. Electronically Signed   By: Marcello Moores  Register   On: 07/17/2020 06:41   DG CHEST PORT 1 VIEW  Result Date: 06/21/2020 CLINICAL DATA:  Respiratory failure. EXAM: PORTABLE CHEST 1 VIEW COMPARISON:   06/28/2020. FINDINGS: Endotracheal tube, NG tube, right PICC line stable position. Stable cardiomegaly. Unchanged bibasilar infiltrates/edema. Unchanged small left pleural effusion. No pneumothorax. IMPRESSION: 1.  Lines and tubes in stable position. 2.  Stable cardiomegaly. 3. Unchanged bibasilar infiltrates/edema. Unchanged small left pleural effusion. Electronically Signed   By: Marcello Moores  Register   On: 07/10/2020 06:21

## 2020-07-17 NOTE — Consult Note (Signed)
[place] Nurse requested for preoperative stoma site marking Patient with necrotizing fasciitis to sacrum, buttocks, perineum and scrotal areas.  S/P surgical debridement.  Will likely need diverting colostomy (next week)  I will mark for stomal placement today.  Known Covid exposure from sister, airborne precautions observed.     Patient is sedated and no family present.   Provided the patient with educational booklet and provided samples of pouching options.   Examined patient lying and with HOB elevated  in order to place the marking in the patient's visual field, away from any creases or abdominal contour issues and within the rectus muscle.    Marked for colostomy in the LLQ  3 cm to the left of the umbilicus and 3 cm above the umbilicus.   Patient's abdomen cleansed with CHG wipes at site markings, allowed to air dry prior to marking.Covered mark with thin film transparent dressing to preserve mark until date of surgery.   [place] Nurse team will follow up with patient after surgery for continue ostomy care and teaching.   Domenic Moras MSN, RN, FNP-BC [facility] Wound, Ostomy, Continence Nurse Pager [phone]

## 2020-07-18 ENCOUNTER — Inpatient Hospital Stay (HOSPITAL_COMMUNITY): Payer: Medicare Other

## 2020-07-18 DIAGNOSIS — I340 Nonrheumatic mitral (valve) insufficiency: Secondary | ICD-10-CM

## 2020-07-18 DIAGNOSIS — I351 Nonrheumatic aortic (valve) insufficiency: Secondary | ICD-10-CM

## 2020-07-18 DIAGNOSIS — I350 Nonrheumatic aortic (valve) stenosis: Secondary | ICD-10-CM

## 2020-07-18 LAB — ECHOCARDIOGRAM COMPLETE
AR max vel: 1.1 cm2
AV Area VTI: 1.05 cm2
AV Area mean vel: 1.1 cm2
AV Mean grad: 37 mmHg
AV Peak grad: 66.3 mmHg
Ao pk vel: 4.07 m/s
Calc EF: 34.6 %
Height: 74 in
P 1/2 time: 302 msec
S' Lateral: 3.8 cm
Single Plane A2C EF: 32.1 %
Single Plane A4C EF: 35.1 %
Weight: 3241.64 oz

## 2020-07-18 LAB — CBC
HCT: 24.1 % — ABNORMAL LOW (ref 39.0–52.0)
Hemoglobin: 7.1 g/dL — ABNORMAL LOW (ref 13.0–17.0)
MCH: 24.8 pg — ABNORMAL LOW (ref 26.0–34.0)
MCHC: 29.5 g/dL — ABNORMAL LOW (ref 30.0–36.0)
MCV: 84.3 fL (ref 80.0–100.0)
Platelets: 166 10*3/uL (ref 150–400)
RBC: 2.86 MIL/uL — ABNORMAL LOW (ref 4.22–5.81)
RDW: 18.4 % — ABNORMAL HIGH (ref 11.5–15.5)
WBC: 18.1 10*3/uL — ABNORMAL HIGH (ref 4.0–10.5)
nRBC: 0.2 % (ref 0.0–0.2)

## 2020-07-18 LAB — GLUCOSE, CAPILLARY
Glucose-Capillary: 205 mg/dL — ABNORMAL HIGH (ref 70–99)
Glucose-Capillary: 225 mg/dL — ABNORMAL HIGH (ref 70–99)
Glucose-Capillary: 255 mg/dL — ABNORMAL HIGH (ref 70–99)
Glucose-Capillary: 309 mg/dL — ABNORMAL HIGH (ref 70–99)
Glucose-Capillary: 269 mg/dL — ABNORMAL HIGH (ref 70–99)

## 2020-07-18 LAB — AEROBIC/ANAEROBIC CULTURE W GRAM STAIN (SURGICAL/DEEP WOUND)

## 2020-07-18 LAB — COMPREHENSIVE METABOLIC PANEL
ALT: 106 U/L — ABNORMAL HIGH (ref 0–44)
AST: 56 U/L — ABNORMAL HIGH (ref 15–41)
Albumin: 1 g/dL — ABNORMAL LOW (ref 3.5–5.0)
Alkaline Phosphatase: 169 U/L — ABNORMAL HIGH (ref 38–126)
Anion gap: 5 (ref 5–15)
BUN: 25 mg/dL — ABNORMAL HIGH (ref 8–23)
CO2: 24 mmol/L (ref 22–32)
Calcium: 7.2 mg/dL — ABNORMAL LOW (ref 8.9–10.3)
Chloride: 110 mmol/L (ref 98–111)
Creatinine, Ser: 0.46 mg/dL — ABNORMAL LOW (ref 0.61–1.24)
GFR calc Af Amer: >60 mL/min (ref >60)
GFR calc non Af Amer: >60 mL/min (ref >60)
Glucose, Bld: 217 mg/dL — ABNORMAL HIGH (ref 70–99)
Potassium: 4 mmol/L (ref 3.5–5.1)
Sodium: 139 mmol/L (ref 135–145)
Total Bilirubin: 0.3 mg/dL (ref 0.3–1.2)
Total Protein: 4.1 g/dL — ABNORMAL LOW (ref 6.5–8.1)

## 2020-07-18 LAB — PHOSPHORUS: Phosphorus: 2.3 mg/dL — ABNORMAL LOW (ref 2.5–4.6)

## 2020-07-18 LAB — CK: Total CK: 192 U/L (ref 49–397)

## 2020-07-18 LAB — TRIGLYCERIDES: Triglycerides: 111 mg/dL (ref <150)

## 2020-07-18 LAB — VITAMIN A: Vitamin A (Retinoic Acid): 5.9 ug/dL — ABNORMAL LOW (ref 22.0–69.5)

## 2020-07-18 LAB — MAGNESIUM: Magnesium: 1.9 mg/dL (ref 1.7–2.4)

## 2020-07-18 IMAGING — DX DG CHEST 1V PORT
2 series · 2 of 2 positions shown · non-contrast
Comparison: [DATE]

CLINICAL DATA: Respiratory failure

EXAM:
PORTABLE CHEST 1 VIEW

[chest ap (1 of 2)]
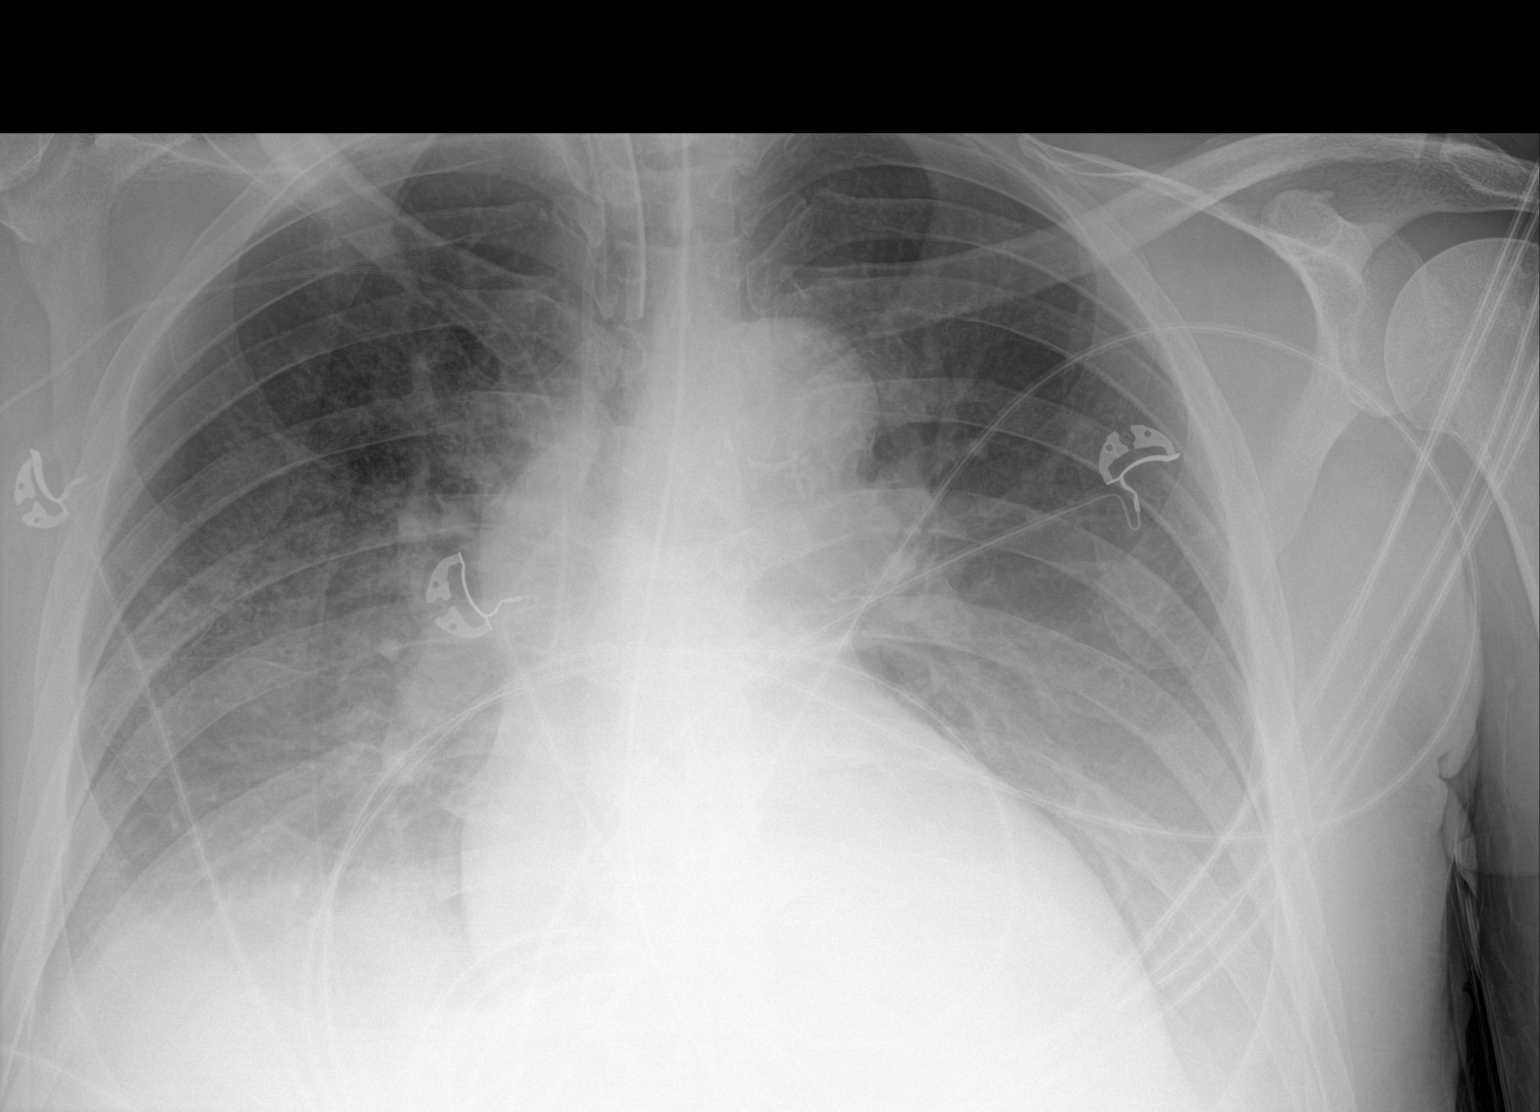

[chest ap (2 of 2)]
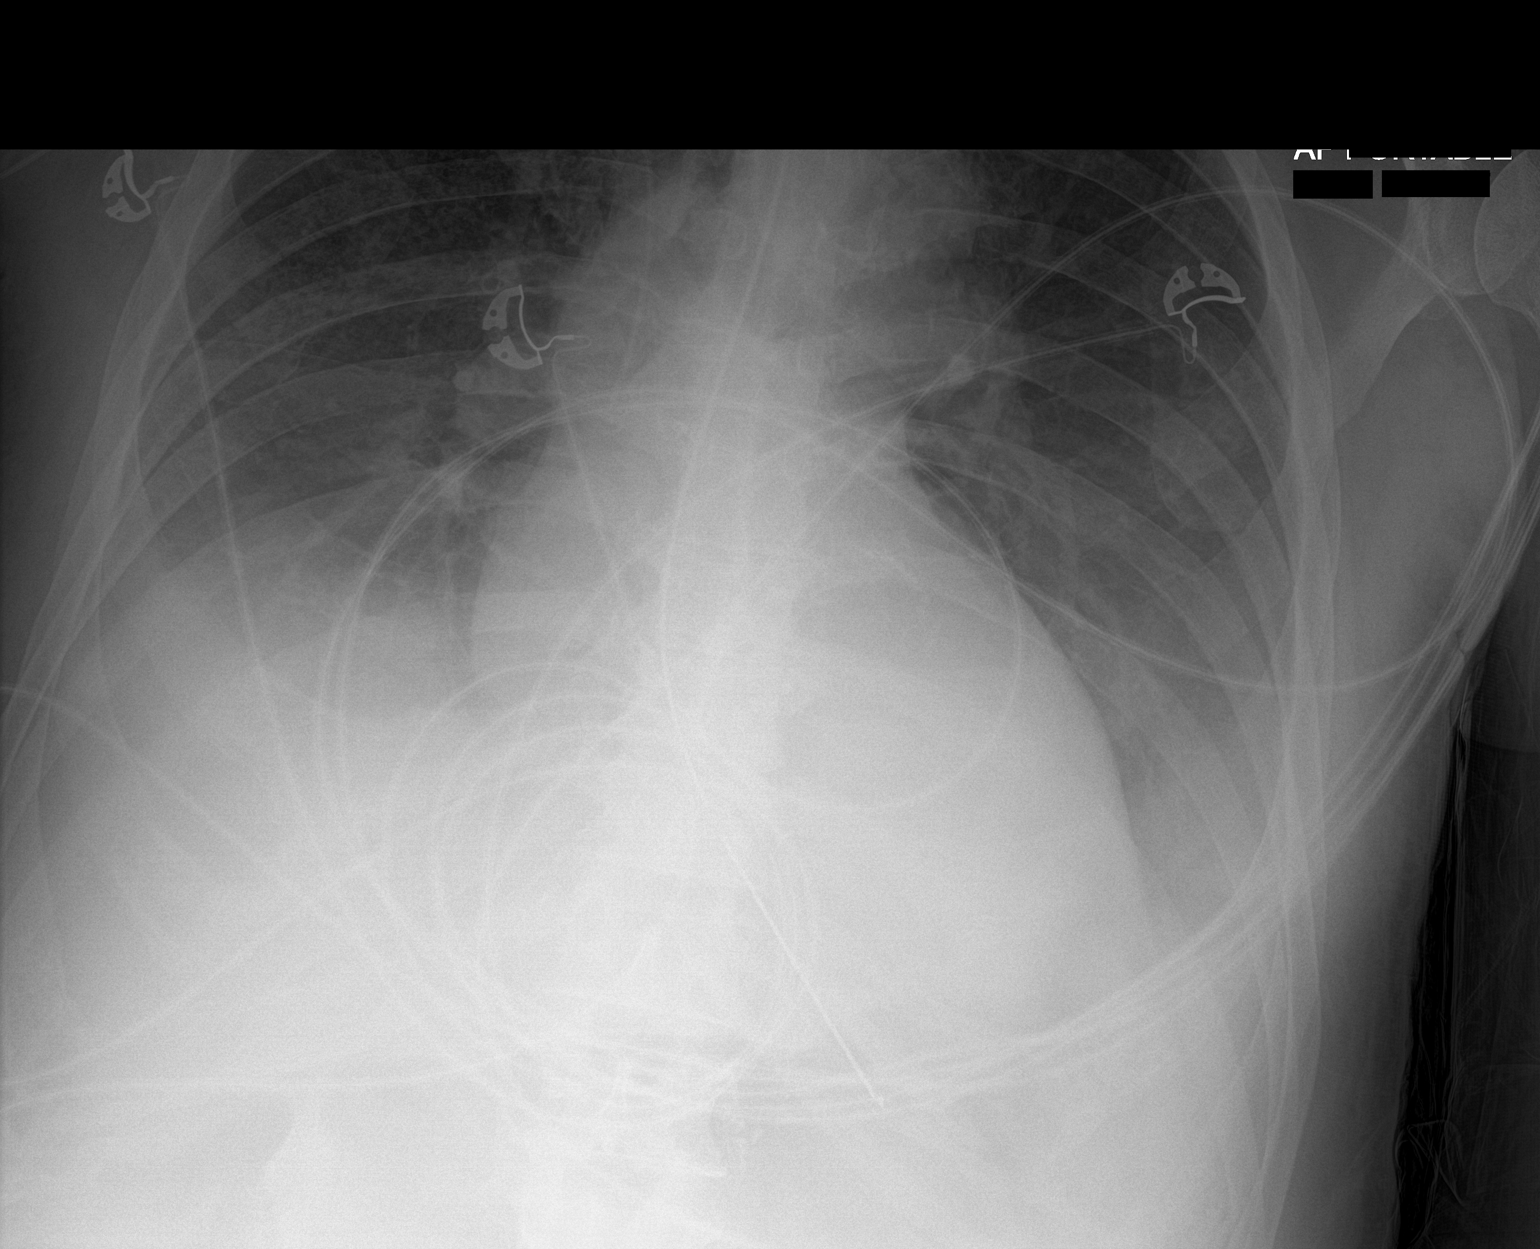

[2 of 2 positions shown; findings below may reference images not displayed]

FINDINGS: The ETT and right PICC line are stable. The NG tube terminates below
today's film. No pneumothorax. Bilateral layering effusions with
associated underlying opacities are similar in the interval. The
infiltrate may be mildly worse in the right base in the interval. No
pneumothorax. No change in the cardiomediastinal silhouette with
persistent cardiomegaly.
IMPRESSION: 1. Support apparatus as above.
2. Bibasilar infiltrates/edema with probable associated effusions
are similar in the interval. The infiltrate may be slightly worsened
on the right in the interval.

## 2020-07-18 MED ORDER — POTASSIUM CHLORIDE 20 MEQ/15ML (10%) PO SOLN
40.0000 meq | Freq: Two times a day (BID) | ORAL | Status: DC
  Administered 2020-07-18 – 2020-07-20 (×5): 40 meq
  Filled 2020-07-18 (×5): qty 30

## 2020-07-18 MED ORDER — ENOXAPARIN SODIUM 40 MG/0.4ML ~~LOC~~ SOLN: 40.0000 mg | SUBCUTANEOUS | Status: DC

## 2020-07-18 MED ORDER — MAGNESIUM SULFATE 2 GM/50ML IV SOLN
2.0000 g | Freq: Once | INTRAVENOUS | Status: AC
  Administered 2020-07-18: 2 g via INTRAVENOUS
  Filled 2020-07-18: qty 50

## 2020-07-18 MED ORDER — PERFLUTREN LIPID MICROSPHERE
1.0000 mL | INTRAVENOUS | Status: AC | PRN
  Administered 2020-07-18: 4 mL via INTRAVENOUS
  Filled 2020-07-18: qty 10

## 2020-07-18 MED ORDER — ACETAMINOPHEN 325 MG PO TABS
650.0000 mg | ORAL_TABLET | Freq: Four times a day (QID) | ORAL | Status: DC | PRN
  Administered 2020-07-18 – 2020-07-25 (×9): 650 mg
  Filled 2020-07-18 (×10): qty 2

## 2020-07-18 MED ORDER — POTASSIUM PHOSPHATES 15 MMOLE/5ML IV SOLN
10.0000 mmol | Freq: Once | INTRAVENOUS | Status: AC
  Administered 2020-07-18: 10 mmol via INTRAVENOUS
  Filled 2020-07-18: qty 3.33

## 2020-07-18 MED ORDER — MIDAZOLAM HCL 2 MG/2ML IJ SOLN
1.0000 mg | INTRAMUSCULAR | Status: DC | PRN
  Administered 2020-07-18 – 2020-07-19 (×5): 2 mg via INTRAVENOUS
  Administered 2020-07-19 – 2020-07-20 (×2): 4 mg via INTRAVENOUS
  Administered 2020-07-20 – 2020-07-23 (×7): 2 mg via INTRAVENOUS
  Filled 2020-07-18 (×3): qty 2
  Filled 2020-07-18: qty 4
  Filled 2020-07-18: qty 2
  Filled 2020-07-18 (×3): qty 4
  Filled 2020-07-18 (×7): qty 2

## 2020-07-18 MED ORDER — FUROSEMIDE 10 MG/ML IJ SOLN
40.0000 mg | Freq: Three times a day (TID) | INTRAMUSCULAR | Status: DC
  Administered 2020-07-18 – 2020-07-19 (×3): 40 mg via INTRAVENOUS
  Filled 2020-07-18 (×3): qty 4

## 2020-07-18 NOTE — Progress Notes (Signed)
[place] Progress Note Patient Name: Omar Todd DOB: October 30, 1951 MRN: [id number]   Date of Service  07/18/2020  HPI/Events of Note  Patient needs a Flexiseal for watery stools.  eICU Interventions  Flexiseal ordered.        [name] Waynesha Rammel 07/18/2020, 11:53 PM

## 2020-07-18 NOTE — Anesthesia Postprocedure Evaluation (Signed)
Anesthesia Post Note  Patient: Omar Todd  Procedure(s) Performed: DEBRIDEMENT OF SOFT TISSUE INFECTION (N/A )     Patient location during evaluation: SICU Anesthesia Type: General Level of consciousness: sedated Pain management: pain level controlled Vital Signs Assessment: post-procedure vital signs reviewed and stable Respiratory status: patient remains intubated per anesthesia plan Cardiovascular status: stable Postop Assessment: no apparent nausea or vomiting Anesthetic complications: no   No complications documented.  Last Vitals:  Vitals:   07/18/20 0415 07/18/20 0430  BP: (!) 100/39 (!) 99/45  Pulse: 60 (!) 58  Resp: 15 15  Temp:    SpO2: 100% 100%    Last Pain:  Vitals:   07/17/20 2000  TempSrc: Axillary  PainSc:                  Micheline Markes DAVID

## 2020-07-18 NOTE — Progress Notes (Signed)
NAME:  Omar Todd, MRN:  [id number], DOB:  03-26-1951, LOS: 6 ADMISSION DATE:  06/23/2020, CONSULTATION DATE:  07/14/20 REFERRING MD:  Omar Todd trnasfer, CHIEF COMPLAINT:  Dr Omar Todd at [place] History    30 pack current smoker with DM, COPD Nos, BP, lipid, GERD. 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 At some pont also has stage 2 sacral decub "Size of a quarter"- described at time of this discharge. Discharged to ? Rehab and then signed out ? AMA from rehab.   Readimmited to [facility] 07/18/2020 with sepsis syndrome(fever + SIRS with lactate 2.1) with growing. Rt buttock ulcer noted to be stage 3 large wth fecal soiling but R AKA stump incision well healed but  By 07/21/2020 purulent drainage noted. CT pelvis c/w necrotizing fasciitis (with sub cut gas) in right perineal soft tissue and bedside exam was concerning for crepitus and concern for Fournier gangrene. Patient noticed to be disoriented by 07/02/2020  On 07/18/2020 Omar Todd excisional debridement of right buttock, right inguinal region, and right scrotal skin measuring (right buttock / right inguinal wound 20cmX10cmX4cm and right scrotal skin 15cmX8cmX2cm); Drainage and packing of Right AKA wound infection  -. 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 Concern for need for repeated debridement and urology involvement and patient transferred to [facility] from [facility] on 07/14/20. Upon arrival getting PRBC for hgb < 7gm% and sedatedh diprivan gtt and fent gtt on ventilator needing neo for bp support.    Decision maker:  he has a wife at [facility] with dementia and estranged children he has not seen since they were babies.Omar Todd [phone]  She is his closest  relative and decision maker. She does not have health care power of attorney.    Past Medical History     has a past medical history of COPD (chronic obstructive pulmonary disease) ([place]), Diabetes mellitus without complication ([place]), Gangrene of toe of right foot ([place]), GERD (gastroesophageal reflux disease), Hyperlipidemia, and Hypertension.   reports that he has been smoking cigarettes. He has a 30.00 pack-year smoking history. He has never used smokeless tobacco.   has a past surgical history that includes Thumb surgery; Esophagogastroduodenoscopy (N/A, 03/13/2020); biopsy (03/13/2020); Flexible sigmoidoscopy (N/A, 05/06/2020); Amputation (Right, 05/17/2020); Application if wound vac (Right, 05/17/2020); Amputation (Right, 06/19/2020); Incision and drainage of wound (Right, 07/12/2020); Incision and drainage (Right, 07/12/2020); Incision and drainage abscess (N/A, 07/02/2020); and Wound debridement (N/A, 07/16/2020).   Significant Hospital Events   06/27/2020 - Omar Todd admit 07/14/20  - transfer to [facility] 8/25 - s. I and D and debridement of Rt Gluteal Nec Fasc by CCS and ->   Inspection of wound, debridement of right testicle/cord, scrotal wound edge ( excised tissue approximately 2 x 4 cm) by Urology. On neo gtt, diprivan gtt, fent gtt . On vent 40% . Not on TF (yet). Ortho Dr Omar Todd recmmended -> packing the  R AKA wound daily . Seen by Dr Omar Todd of Urology - currently R testicle exposed but viable tissue  8/26 = PRBC < 7 and 1 u prbc ordered. On vent. BAck to OR today, . Exposure to sister with covid + . Sister Omar Todd the [place] reported that  she is covid positive 8/.25/2 but she has not been at bedside. Later the visitor - sster. Omar Todd is also covid positive. APteitn repeat covid - negative  8/27 - Followed commands on WUA 8/27 am Remains on fent gtt at 125 mcg, diprivan gtt at 30 mcg and neo gtt at 75 mcg via PICC line.  T max 101 WBC 16 HGB 7.6 Platelets 170,000 Lactate 1.3 Potassium 3.9 Mag  2 + 16 L since admission      Consults:  8/24 -   Procedures:  8/23 -  On 06/24/2020 Omar Todd excisional debridement of right buttock, right inguinal region, and right scrotal skin measuring (right buttock / right inguinal wound 20cmX10cmX4cm and right scrotal skin 15cmX8cmX2cm); Drainage and packing of Right AKA wound infection ETT 823  RUE PIcc 8/24  Significant Diagnostic Tests:  x  Micro Data:   Microbiology results: 8/22 BCx:  8/22 UCx:   8/22 Wound cx 8/23 - wound - STREPTOCOCCUS AGALACTIAE 8/23 wound inguinal -  KLEBSIELLA OXYTOCA     Organism ID, Bacteria STREPTOCOCCUS CONSTELLATUS    xxx 8/26 covid pcr - neg 8/29 - repeat covid pcr   Antimicrobials:  Antimicrobials this admission: 8/22 Cipro x 1 8/22 Clinda x 8/22 Flagyl >> ? Stop date 8/22 Vanc >> 8/22 Cefepime>>8/27 82/7 ceftriaxone >>   Interim history/subjective:   8/28 -  +18L since admit. Looking more voume overloaded. Increased fio2 50% on vent. CXR wet. Unable to wean. On fent gtt, diprivan gtt, neo gtt and LR at 100cc/h  Objective   Blood pressure (!) 145/66, pulse 99, temperature 100.3 F (37.9 C), temperature source Oral, resp. rate (!) 22, height 6\' 2"  (1.88 m), weight 91.9 kg, SpO2 100 %.    Vent Mode: PRVC FiO2 (%):  [30 %-100 %] 80 % Set Rate:  [15 bmp] 15 bmp Vt Set:  [650 mL] 650 mL PEEP:  [5 cmH20] 5 cmH20 Plateau Pressure:  [11 cmH20-21 cmH20] 11 cmH20   Intake/Output Summary (Last 24 hours) at 07/18/2020 1051 Last data filed at 07/18/2020 0746 Gross per 24 hour  Intake 5882.45 ml  Output 900 ml  Net 4982.45 ml   Filed Weights   07/14/20 1300 07/07/2020 0403 07/18/20 0500  Weight: 80.7 kg 84.5 kg 91.9 kg     General Appearance:  Looks criticall ill. Looks puffy Head:  Normocephalic, without obvious abnormality, atraumatic Eyes:  PERRL - yes, conjunctiva/corneas - muddy     Ears:  Normal external ear canals, both ears Nose:  G tube - no Throat:  ETT TUBE - yes , OG tube  - yes Neck:  Supple,  No enlargement/tenderness/nodules Lungs: Clear to auscultation bilaterally, Ventilator   Synchrony - yes 50% Heart:  S1 and S2 normal, no murmur, CVP - x.  Pressors - yes Abdomen:  Soft, no masses, no organomegaly Genitalia / Rectal:  Dressing in innguinal area Extremities:  Extremities- Rt AKA - dressing Skin:  ntact in exposed areas . Sacral area - not examined Neurologic:  Sedation - fent gtt, diprivan gtt -> RASS - -4     Resolved Hospital Problem list   x  Assessment & Plan:  ASSESSMENT / PLAN:  PULMONARY  A:  Baseline COPD NOS Acute post op respiratory failure in setting of infected sacral ulcer and Fournier gangrene 07/06/2020.    07/18/2020 - > does not meet criteria for SBT/Extubation in setting of Acute Respiratory Failure due to volume overload, high fio2 needs and sedation needs and pressor need  P:   PRVC VAP bundle Nebs  NEUROLOGIC A:   Acute encephalopathy in setting of sepsis 06/28/2020 and pre-op. Left sedated post op on ventilator  07/18/2020 - WUA done 8/27 am - Followed commands and MAE. On 8/28 - sedated on diprivan gtt and fent gtt  P:   RASS goal 0 to -2 Fent gtt Diprivan gtt -> STOP Start versed prn Daily WUA   VASCULAR A:   Circulatory shock likely due to sepsis and dioprivan  07/18/2020 - On  neo   P:  MAP goal > 65  Dc diprivan and see if neo still needed   CARDIAC STRUCTURAL A: Moderate AS in April 2021 with EF 55% and LVH  P: Clinically monitor Repeat echo Todd volume overload Start lasix   INFECTIOUS A:   SEvere sepsis from R Fournier gangrene, RT ischial ulcer and Rt AKA re-infection  In hospital covid expsoure from family member 07/01/2020  07/18/2020 - Remains febrile  P:   Broad abx as ordered Retest covid pcr 07/19/20 Culture as is clinically indicated   RENAL A:  At risk for AKI Volume overload  07/18/2020 - Creat normal.  +18L Volume ooverload since admit  P:  Fluids Avoid  nephrotixins Maintain BP/HR Maintain renal perfusion  ELECTROLYTES A:  Electrolyte imbalance  07/18/2020 -  Hypomagnesemia Hypophosphatemia   P: Replete mag and phos K goal > 4 Mag goal > 2 Phos goal - normal   GASTROINTESTINAL A:   AT risk stress ulcer Shock liver  07/18/2020 - RUQ US>> Increased echogenicity liver compatible with fatty infiltration. No liver mass. Mild gallbladder sludge.  No biliary dilatation.  P:   Protoonix Trend LFT's  HEMATOLOGIC   - HEME A:  Anemia of critical illness and chronic disease - s/p PRBC 07/14/20 and 07/01/2020  07/18/2020 - no overt bleeding Still > 7gm%   P:  -Monitor for bleeding -Transfuse for HGB per noted below - PRBC for hgb </= 6.9gm%    - exceptions are   -  if ACS susepcted/confirmed then transfuse for hgb </= 8.0gm%,  or    -  active bleeding with hemodynamic instability, then transfuse regardless of hemoglobin value   At at all times try to transfuse 1 unit prbc as possible with exception of active hemorrhage   ENDOCRINE A:   At risk for hypo and hyperglycemia    P:   icu hypergluycemiua protocol CBG Q4 SSI  MSK/DERM Rt AKA wound infection - on dressing and chaing RT sacral decub - large with nec fasc Rt Fournier gangrene   07/18/2020 s/p ID 07/10/2020 and  07/09/2020. New one reported left shoulder - DTI. No further surgery planned   Plan  - ortho, ccs and urology appreciate assistance   Best practice:  Diet: TF  Pain/Anxiety/Delirium protocol (if indicated): sedation protocol VAP protocol (if indicated): Bundle + DVT prophylaxis: Lovenox/Heparin GI prophylaxis: ppi Glucose control: ssi Mobility: bed rest Code Status: full code  Family Communication: Sister Inmer Nix main decision makder  [phone] updated 07/18/20  Over phone. - she had questions about why being asked to set up password as new process. Offered to refer to office of patient experience. However, she wanted bedside RN to give her   A call  Disposition: [facility]     ATTESTATION & SIGNATURE   The patient Omar Todd is critically ill with multiple organ systems failure and requires high complexity decision making for assessment and support, frequent evaluation and titration of therapies,  application of advanced monitoring technologies and extensive interpretation of multiple databases.   Critical Care Time devoted to patient care services described in this note is  35  Minutes. This time reflects time of care of this signee Dr Brand Males. This critical care time does not reflect procedure time, or teaching time or supervisory time of PA/NP/Med student/Med Resident etc but could involve care discussion time     Dr. Brand Males, M.D., [facility].C.P Pulmonary and Critical Care Medicine Staff Physician [place] [facility] Pager: [phone], If no answer or between  15:00h - 7:00h: call [phone]  07/18/2020 11:56 AM    LABS    PULMONARY Recent Labs  Lab 07/21/2020 1850 07/14/20 1413  PHART 7.463* 7.392  PCO2ART 35.8 39.3  PO2ART 103 96.8  HCO3 26.0 23.4  O2SAT 98.2 97.6    CBC Recent Labs  Lab 06/28/2020 0425 07/17/20 0457 07/18/20 0352  HGB 6.7* 7.6* 7.1*  HCT 23.4* 25.2* 24.1*  WBC 15.0* 16.0* 18.1*  PLT 195 170 166    COAGULATION Recent Labs  Lab 06/30/2020 1800  INR 1.3*    CARDIAC  No results for input(s): TROPONINI in the last 168 hours. No results for input(s): PROBNP in the last 168 hours.   CHEMISTRY Recent Labs  Lab 07/17/2020 1940 07/17/2020 0551 07/14/20 1730 07/14/20 1730 06/23/2020 0355 06/21/2020 0355 07/17/2020 0425 07/19/2020 0425 07/17/20 0457 07/18/20 0352  Todd  --    < > 139  --  139  --  139  --  141 139  K  --    < > 3.2*   < > 4.5   < > 3.8   < > 3.9 4.0  CL  --    < > 111  --  110  --  110  --  111 110  CO2  --    < > 22  --  21*  --  23  --  24 24  GLUCOSE  --    < > 96  --  114*  --  214*  --  198* 217*    BUN  --    < > 23  --  25*  --  27*  --  26* 25*  CREATININE  --    < > 0.53*  --  0.63  --  0.62  --  0.55* 0.46*  CALCIUM  --    < > 6.6*  --  7.2*  --  7.2*  --  7.2* 7.2*  MG 1.7  --   --   --   --   --  1.6*  --  2.0 1.9  PHOS  --   --   --   --   --   --  1.5*  --  2.0* 2.3*   < > = values in this interval not displayed.   Estimated Creatinine Clearance: 101.3 mL/min (A) (by C-G formula based on SCr of 0.46 mg/dL (L)).   LIVER Recent Labs  Lab 07/11/2020 1800 07/14/20 0817 07/14/20 1730 07/14/2020 0355 07/09/2020 0425 07/17/20 0457 07/18/20 0352  AST 28   < > 660* 409* 182* 149* 56*  ALT 24   < > 359* 324* 197* 172* 106*  ALKPHOS 117   < > 85 96 118 139* 169*  BILITOT 0.9   < > 0.6 0.5 0.7 0.6 0.3  PROT 5.7*   < > 3.9* 4.4* 3.8* 4.1* 4.1*  ALBUMIN 2.1*   < > 1.3* 1.4* 1.2* 1.1* 1.0*  INR 1.3*  --   --   --   --   --   --    < > = values in this interval not displayed.     INFECTIOUS Recent Labs  Lab 07/08/2020 2005 06/25/2020 2209 07/17/20 0457  LATICACIDVEN 2.1* 1.7 1.3     ENDOCRINE CBG (last 3)  Recent Labs    07/17/20 2043 07/17/20 2317 07/18/20 0356  GLUCAP 201* 195* 205*         IMAGING x48h  - image(s) personally visualized  -   highlighted in bold DG Abd 1 View  Result Date: 07/17/2020 CLINICAL DATA:  Initial evaluation for NG tube placement. EXAM: ABDOMEN - 1 VIEW COMPARISON:  None. FINDINGS: Enteric tube in place with tip overlying the stomach, side hole beyond the GE junction. Visualized bowel gas pattern is nonobstructive. Prominent vascular calcifications noted within the infrarenal aorta. Veiling opacity overlying left hemidiaphragm suggestive of small left pleural effusion. IMPRESSION: 1. Enteric tube in place with tip overlying the stomach, side hole beyond the GE junction. 2. Small left pleural effusion. 3.  Aortic Atherosclerosis (ICD10-I70.0). Electronically Signed   By: Jeannine Boga M.D.   On: 07/17/2020 01:41   DG CHEST PORT 1  VIEW  Result Date: 07/18/2020 CLINICAL DATA:  Respiratory failure EXAM: PORTABLE CHEST 1 VIEW COMPARISON:  July 17, 2020 FINDINGS: The ETT and right PICC line are stable. The NG tube terminates below today's film. No pneumothorax. Bilateral layering effusions with associated underlying opacities are similar in the interval. The infiltrate may be mildly worse in the right base in the interval. No pneumothorax. No change in the cardiomediastinal silhouette with persistent cardiomegaly. IMPRESSION: 1. Support apparatus as above. 2. Bibasilar infiltrates/edema with probable associated effusions are similar in the interval. The infiltrate may be slightly worsened on the right in the interval. Electronically Signed   By: Dorise Bullion III M.D   On: 07/18/2020 09:49   DG CHEST PORT 1 VIEW  Result Date: 07/17/2020 CLINICAL DATA:  Intubation. EXAM: PORTABLE CHEST 1 VIEW COMPARISON:  07/20/2020. FINDINGS: Endotracheal tube, NG tube, right PICC line stable position. Stable cardiomegaly. Unchanged bibasilar infiltrates/edema. Persistent left pleural effusion. Right pleural effusion noted on today's exam. No pneumothorax. Carotid vascular calcification. IMPRESSION: 1.  Lines and tubes in stable position. 2.  Stable cardiomegaly. 3. Unchanged bibasilar infiltrates/edema. Persistent left pleural effusion. Right pleural effusion noted on today's exam. Electronically Signed   By: Marcello Moores  Register   On: 07/17/2020 06:41     Anti-infectives (From admission, onward)   Start     Dose/Rate Route Frequency Ordered Stop   07/17/20 1600  cefTRIAXone (ROCEPHIN) 2 g in sodium chloride 0.9 % 100 mL IVPB        2 g 200 mL/hr over 30 Minutes Intravenous Every 24 hours 07/17/20 1528     07/17/20 1400  vancomycin (VANCOCIN) IVPB 1000 mg/200 mL premix        1,000 mg 200 mL/hr over 60 Minutes Intravenous Every 8 hours 07/17/20 0944     06/22/2020 1600  ceFEPIme (MAXIPIME) 2 g in sodium chloride 0.9 % 100 mL IVPB  Status:   Discontinued        2 g 200 mL/hr over 30 Minutes Intravenous Every 8 hours 07/09/2020 1537 07/17/20 1528   07/17/2020 1500  clindamycin (CLEOCIN) IVPB 600 mg        600 mg 100 mL/hr over 30 Minutes Intravenous  Every 6 hours 06/23/2020 1337 07/18/20 0553   07/19/2020 0600  vancomycin (VANCOREADY) IVPB 750 mg/150 mL  Status:  Discontinued        750 mg 150 mL/hr over 60 Minutes Intravenous Every 8 hours 07/11/2020 2310 07/17/20 0944   07/15/2020 0200  metroNIDAZOLE (FLAGYL) IVPB 500 mg  Status:  Discontinued        500 mg 100 mL/hr over 60 Minutes Intravenous Every 8 hours 07/15/2020 2220 07/13/20 1337   07/21/2020 2315  ceFEPIme (MAXIPIME) 2 g in sodium chloride 0.9 % 100 mL IVPB  Status:  Discontinued        2 g 200 mL/hr over 30 Minutes Intravenous Every 8 hours 07/06/2020 2310 06/29/2020 1537   07/07/2020 2000  vancomycin (VANCOREADY) IVPB 1500 mg/300 mL        1,500 mg 150 mL/hr over 120 Minutes Intravenous  Once 07/18/2020 1834 06/30/2020 2326   07/10/2020 1800  vancomycin (VANCOCIN) IVPB 1000 mg/200 mL premix  Status:  Discontinued        1,000 mg 200 mL/hr over 60 Minutes Intravenous  Once 07/21/2020 1757 06/25/2020 1834   07/05/2020 1800  clindamycin (CLEOCIN) IVPB 600 mg        600 mg 100 mL/hr over 30 Minutes Intravenous  Once 07/10/2020 1759 07/20/2020 1913   07/21/2020 1800  ciprofloxacin (CIPRO) IVPB 400 mg        400 mg 200 mL/hr over 60 Minutes Intravenous  Once 07/18/2020 1759 07/13/2020 2039

## 2020-07-18 NOTE — Progress Notes (Signed)
[name] [id number] 1951/05/02  CARE TEAM:  PCP: [name], DO  Outpatient Care Team: Patient Care Team: [name], DO as PCP - General (Family Medicine)  Inpatient Treatment Team: Treatment Team: Attending Provider: Brand Males, MD; Consulting Physician: Virl Cagey, MD; Consulting Physician: [name], MD; Rounding Team: Pccm, Md, MD; Consulting Physician: Cleon Gustin, MD; Consulting Physician: Lucas Mallow, MD; [place] Nurse: Jeannie Fend, FNP; Registered Nurse: Rodrigo Ran, RN; Registered Nurse: Charleen Kirks, RN; Utilization Review: Alease Medina, RN   Problem List:   Principal Problem:   Sepsis [facility]) Active Problems:   Diabetic neuropathy, type II diabetes mellitus ([place])   BPH (benign prostatic hyperplasia)   Right ischial pressure sore, unstageable ([place])   Right above-knee amputee ([place])   Necrotizing fasciitis ([place])   Fournier's gangrene of scrotum   Surgical wound infection   Acute respiratory failure with hypoxia ([place])   Dehiscence of amputation stump ([place])   2 Days Post-Op  07/03/2020  07/15/2020  PATIENT:  Omar Todd  69 y.o. male  PRE-OPERATIVE DIAGNOSIS:  [place] DIAGNOSIS:  [place]:  Procedure(s): DEBRIDEMENT OF SOFT TISSUE INFECTION (N/A)  SURGEON:  Surgeon(s) and Role:    Ralene Ok, MD - Primary  07/03/2020  12:05 PM  PATIENT:  Omar Todd  69 y.o. male  PRE-OPERATIVE DIAGNOSIS:  NECROTIZING FASCIITIS  POST-OPERATIVE DIAGNOSIS:  NECROTIZING FASCIITIS  PROCEDURE:  Procedure(s): INCISION AND DRAINAGE, IRRIGATION AND DEBRIDEMENT SOFT TISSUE INFECTION NECROTIZING FASCIITIS (N/A)  SURGEON:  Surgeon(s) and Role:    Ralene Ok, MD - Primary    * Franchot Gallo, MD - Assisting   Operative Note  07/14/2020  Preoperative Diagnosis:  Right ischial decubitus ulcer, unstageable and  subcutaneous gas concerning for necrotizing infection    Postoperative Diagnosis:  Necrotizing Infection/ Fournier's Gangrene of right buttock, right inguinal region and right scrotum; Right Above Knee Amputation Incision Infection   Procedure(s) Performed:  Sharp excisional debridement of right buttock, right inguinal region, and right scrotal skin measuring (right buttock / right inguinal wound 20cmX10cmX4cm and right scrotal skin 15cmX8cmX2cm); Drainage and packing of Right AKA wound infection    Surgeon: Lanell Matar. Constance Haw, MD  06/19/2020   PATIENT:  Omar Todd    PRE-OPERATIVE DIAGNOSIS:  RIGHT BELOW KNEE AMPUTATION WOUND dehiscence  POST-OPERATIVE DIAGNOSIS:  Same  PROCEDURE:  RIGHT ABOVE KNEE AMPUTATION Application VAC  SURGEON:  Newt Minion, MD   Sepsis with necrotizing fasciitis/hypotension Ventilator dependent respiratory failure Anemia COPD Type 2 diabetes with neuropathy Hypertension Elevated transaminase - AST 1151/ALT 452 COVID-19 exposure - family member tested positive, retest per ID, continue precautions per ID  Right AKA 7/30-Dr. Sharol Given Right above-the-knee amputation incision infection S/p debridement by Dr. Constance Haw 06/30/2020 - MRSA + - management per Dr. 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 Constance Haw 06/22/2020 S/p debridement by Dr. Rosendo Gros and Dr. Diona Fanti 07/15/20 S/p debridement by Dr. Rosendo Gros 07/15/2020 - dressing changed at bedside this AM,  NS dressing changes - cx from ischial wound Klebsiella oxytoca Streptococcus constellatus    - WBC 16, afebrile -Klebsiella sensitive to cefepime.  Doubt of need to continue clindamycin at this point.  Defer to critical care. - continue to off-load pressure as able -Septic shock seems to be resolving. - Hypertensive management per critical care -Hold off any more soft tissue debridement this weekend.  Try and lead multisystem organ failure stabilize/improve - patient will likely need diverting loop colostomy next week to allow for wound healing - CCS called and discussed this with patient's sister, [name], on 8/27, who is agreeable to this plan   FEN: TF at goal.  Follow nutritional numbers., IVF, NPO SF:KCLEX x 18/22; Flagyl x 2 8/23; Vancomycin 8/22>>, Maxipime/Clindamycin 8/23>>   Endo: Diabetic management as possible.  Try to balance for nutrition with need for management of hyperglycemia NTZ:[id number]   LOS: 7 days    25 minutes spent in review, evaluation, examination, counseling, and coordination of care.  More than 50% of that time was spent in counseling.  07/18/2020    Subjective: (Chief complaint)  Remains intubated.  Still with oxygen requirements.  Objective:  Vital signs:  Vitals:   07/18/20 0630 07/18/20 0645 07/18/20 0700 07/18/20 0746  BP: (!) 117/45 (!) 115/45 (!) 116/46 (!) 145/66  Pulse: 67 61 64 99  Resp: 15 14 14  (!) 22  Temp:    100.3 F (37.9 C)  TempSrc:    Oral  SpO2: 100% 100% 100% 100%  Weight:      Height:        Last BM Date:  (uta)  Intake/Output   Yesterday:  08/27 0701 - 08/28 0700 In: 6267.8 [I.V.:3390.8; NG/GT:1860; IV Piggyback:1017] Out: 1350 [Urine:1350] This shift:  Total I/O In: 340 [I.V.:216.7; NG/GT:50; IV Piggyback:73.3] Out: -   Bowel function:  Flatus: YES  BM:  No  Drain:    Physical Exam:  General: Pt intubated and sedated in moderate acute distress Eyes: PERRL, normal EOM.  Sclera clear.  No icterus Neuro: CN II-XII intact w/o focal sensory/motor deficits. Lymph: No head/neck/groin lymphadenopathy Psych:  No delerium/psychosis/paranoia.  HENT: Normocephalic, Mucus membranes moist.  No thrush Neck: Supple, No tracheal deviation.  No obvious thyromegaly Chest: No pain to chest wall compression.  Good respiratory excursion.  No audible wheezing CV:  Pulses intact.  Regular rhythm.  No  major extremity edema MS: Normal AROM mjr joints.  No obvious deformity  Abdomen: Soft.  Nondistended.  Nontender.  No evidence of peritonitis.  No incarcerated hernias.  Massive perineal wound examination noted yesterday afternoon.  Wound change with fair granulation but no worsening progression.    Ext: Right above-knee amputation dressing intact.   No mjr edema.  No cyanosis Skin: No petechiae / purpurea.  No major sores.  Warm and dry    Results:   Cultures: Recent Results (from the past 720 hour(s))  Urine culture     Status: Abnormal   Collection Time: 07/11/2020  5:57 PM   Specimen: In/Out Cath Urine  Result Value Ref Range Status   Specimen Description   Final    IN/OUT CATH URINE Performed at [facility], [address], [place], [place] [address]    Special Requests   Final    NONE Performed at [facility], [address], [place], [place] [address]    Culture MULTIPLE SPECIES PRESENT, SUGGEST RECOLLECTION (A)  Final   Report Status 06/26/2020 FINAL  Final  Culture, blood (Routine x 2)     Status: None   Collection Time: 07/17/2020  6:03 PM   Specimen: BLOOD  Result Value Ref Range Status   Specimen Description BLOOD RIGHT HAND  Final   Special Requests   Final    BOTTLES DRAWN AEROBIC AND ANAEROBIC Blood Culture adequate volume   Culture   Final    NO GROWTH 5 DAYS Performed  at [facility], [address] [place], [place] [address]    Report Status 07/17/2020 FINAL  Final  Culture, blood (Routine x 2)     Status: None   Collection Time: 06/30/2020  6:03 PM   Specimen: BLOOD  Result Value Ref Range Status   Specimen Description BLOOD LEFT HAND  Final   Special Requests   Final    BOTTLES DRAWN AEROBIC AND ANAEROBIC Blood Culture adequate volume   Culture   Final    NO GROWTH 5 DAYS Performed at [facility], [address], [place], [place] [address]    Report Status 07/17/2020 FINAL  Final  Wound or Superficial Culture     Status: Abnormal   Collection  Time: 07/19/2020  6:58 PM   Specimen: Ulcer; Wound  Result Value Ref Range Status   Specimen Description   Final    ULCER Performed at [facility], [address], [place], [place] [address]    Special Requests   Final    NONE Performed at [facility], [address], [place], [place] [address]    Gram Stain   Final    NO WBC SEEN MODERATE GRAM POSITIVE RODS FEW GRAM POSITIVE COCCI FEW GRAM NEGATIVE RODS    Culture (A)  Final    MULTIPLE ORGANISMS PRESENT, NONE PREDOMINANT NO STAPHYLOCOCCUS AUREUS ISOLATED NO GROUP A STREP (S.PYOGENES) ISOLATED Performed at [facility], [place] [address] [place], [place] [address]    Report Status 06/23/2020 FINAL  Final  SARS Coronavirus 2 by RT PCR (hospital order, performed in [facility] hospital lab) Nasopharyngeal Nasopharyngeal Swab     Status: None   Collection Time: 07/03/2020  7:21 PM   Specimen: Nasopharyngeal Swab  Result Value Ref Range Status   SARS Coronavirus 2 NEGATIVE NEGATIVE Final    Comment: (NOTE) SARS-CoV-2 target nucleic acids are NOT DETECTED.  The SARS-CoV-2 RNA is generally detectable in upper and lower respiratory specimens during the acute phase of infection. The lowest concentration of SARS-CoV-2 viral copies this assay can detect is 250 copies / mL. A negative result does not preclude SARS-CoV-2 infection and should not be used as the sole basis for treatment or other patient management decisions.  A negative result may occur with improper specimen collection / handling, submission of specimen other than nasopharyngeal swab, presence of viral mutation(s) within the areas targeted by this assay, and inadequate number of viral copies (<250 copies / mL). A negative result must be combined with clinical observations, patient history, and epidemiological information.  Fact Sheet for Patients:   StrictlyIdeas.no  Fact Sheet for Healthcare  Providers: BankingDealers.co.za  This test is not yet approved or  cleared by the [place] FDA and has been authorized for detection and/or diagnosis of SARS-CoV-2 by FDA under an Emergency Use Authorization (EUA).  This EUA will remain in effect (meaning this test can be used) for the duration of the COVID-19 declaration under Section 564(b)(1) of the Act, 21 U.S.C. section 360bbb-3(b)(1), unless the authorization is terminated or revoked sooner.  Performed at [facility], [address] [place], [name] [address]   Aerobic/Anaerobic Culture (surgical/deep wound)     Status: Abnormal (Preliminary result)   Collection Time: 07/01/2020  9:30 AM   Specimen: Abscess; Wound  Result Value Ref Range Status   Specimen Description   Final    ABSCESS SACRUM Performed at [facility], [address], [place], [place] [address]    Special Requests   Final  NONE Performed at [facility], [address] [place], [place] [address]    Gram Stain   Final    RARE WBC PRESENT,BOTH PMN AND MONONUCLEAR ABUNDANT GRAM NEGATIVE RODS MODERATE GRAM POSITIVE COCCI FEW GRAM VARIABLE ROD    Culture (A)  Final    MULTIPLE ORGANISMS PRESENT, NONE PREDOMINANT NO STAPHYLOCOCCUS AUREUS ISOLATED NO GROUP A STREP (S.PYOGENES) ISOLATED HOLDING FOR POSSIBLE ANAEROBE Performed at [facility], [place] [address] [place], [place] [address]    Report Status PENDING  Incomplete  Aerobic/Anaerobic Culture (surgical/deep wound)     Status: None (Preliminary result)   Collection Time: 06/25/2020  2:33 PM   Specimen: Wound  Result Value Ref Range Status   Specimen Description   Final    WOUND INGUINAL Performed at [facility], [address], [place], [place] [address]    Special Requests   Final    NONE Performed at [facility], [address], [place], [place] [address]    Gram Stain   Final    RARE WBC PRESENT, PREDOMINANTLY PMN ABUNDANT GRAM POSITIVE COCCI IN PAIRS MODERATE  GRAM POSITIVE RODS FEW GRAM NEGATIVE RODS Performed at [facility], [place] [address] [place], [place] [address]    Culture   Final    RARE KLEBSIELLA OXYTOCA RARE STREPTOCOCCUS CONSTELLATUS NO ANAEROBES ISOLATED; CULTURE IN PROGRESS FOR 5 DAYS    Report Status PENDING  Incomplete   Organism ID, Bacteria KLEBSIELLA OXYTOCA  Final   Organism ID, Bacteria STREPTOCOCCUS CONSTELLATUS  Final      Susceptibility   Streptococcus constellatus - MIC*    PENICILLIN INTERMEDIATE Intermediate     CEFTRIAXONE 2 INTERMEDIATE Intermediate     ERYTHROMYCIN >=8 RESISTANT Resistant     LEVOFLOXACIN <=0.25 SENSITIVE Sensitive     VANCOMYCIN 0.5 SENSITIVE Sensitive     * RARE STREPTOCOCCUS CONSTELLATUS   Klebsiella oxytoca - MIC*    AMPICILLIN RESISTANT Resistant     CEFAZOLIN <=4 SENSITIVE Sensitive     CEFEPIME <=0.12 SENSITIVE Sensitive     CEFTAZIDIME <=1 SENSITIVE Sensitive     CEFTRIAXONE <=0.25 SENSITIVE Sensitive     CIPROFLOXACIN <=0.25 SENSITIVE Sensitive     GENTAMICIN <=1 SENSITIVE Sensitive     IMIPENEM <=0.25 SENSITIVE Sensitive     TRIMETH/SULFA <=20 SENSITIVE Sensitive     AMPICILLIN/SULBACTAM 4 SENSITIVE Sensitive     PIP/TAZO <=4 SENSITIVE Sensitive     * RARE KLEBSIELLA OXYTOCA  Aerobic/Anaerobic Culture (surgical/deep wound)     Status: Abnormal (Preliminary result)   Collection Time: 07/12/2020  2:33 PM   Specimen: Wound  Result Value Ref Range Status   Specimen Description   Final    WOUND ISCHIAL Performed at [facility], [address], [place], [place] [address]    Special Requests   Final    NONE Performed at [facility], [address] [place], [place] [address]    Gram Stain   Final    RARE WBC PRESENT, PREDOMINANTLY PMN MODERATE GRAM POSITIVE COCCI FEW GRAM POSITIVE RODS FEW GRAM NEGATIVE RODS Performed at [facility], [address], [place],  [address]    Culture (A)  Final    STREPTOCOCCUS AGALACTIAE TESTING AGAINST S. AGALACTIAE NOT  ROUTINELY PERFORMED DUE TO PREDICTABILITY OF AMP/PEN/VAN SUSCEPTIBILITY. NO ANAEROBES ISOLATED; CULTURE IN PROGRESS FOR 5 DAYS    Report Status PENDING  Incomplete  Aerobic/Anaerobic Culture (surgical/deep wound)     Status: None (Preliminary result)   Collection Time: 07/02/2020  2:33 PM   Specimen: Wound  Result Value Ref Range Status   Specimen Description   Final    WOUND AKA STUMP Performed at [facility], [address], [place], [place] [address]    Special Requests   Final    NONE Performed at [facility], [address], [place], [place] [address]    Gram Stain   Final    ABUNDANT WBC PRESENT, PREDOMINANTLY PMN RARE GRAM POSITIVE COCCI Performed at [facility], [place] [address] [place], [place] [address]    Culture   Final    RARE METHICILLIN RESISTANT STAPHYLOCOCCUS AUREUS RARE GROUP B STREP(S.AGALACTIAE)ISOLATED TESTING AGAINST S. AGALACTIAE NOT ROUTINELY PERFORMED DUE TO PREDICTABILITY OF AMP/PEN/VAN SUSCEPTIBILITY. NO ANAEROBES ISOLATED; CULTURE IN PROGRESS FOR 5 DAYS    Report Status PENDING  Incomplete   Organism ID, Bacteria METHICILLIN RESISTANT STAPHYLOCOCCUS AUREUS  Final      Susceptibility   Methicillin resistant staphylococcus aureus - MIC*    CIPROFLOXACIN >=8 RESISTANT Resistant     ERYTHROMYCIN >=8 RESISTANT Resistant     GENTAMICIN <=0.5 SENSITIVE Sensitive     OXACILLIN >=4 RESISTANT Resistant     TETRACYCLINE <=1 SENSITIVE Sensitive     VANCOMYCIN 1 SENSITIVE Sensitive     TRIMETH/SULFA >=320 RESISTANT Resistant     CLINDAMYCIN <=0.25 SENSITIVE Sensitive     RIFAMPIN <=0.5 SENSITIVE Sensitive     Inducible Clindamycin NEGATIVE Sensitive     * RARE METHICILLIN RESISTANT STAPHYLOCOCCUS AUREUS  MRSA PCR Screening     Status: Abnormal   Collection Time: 07/14/20 12:33 PM   Specimen: Nasal Mucosa; Nasopharyngeal  Result Value Ref Range Status   MRSA by PCR POSITIVE (A) NEGATIVE Final    Comment:        The GeneXpert MRSA Assay (FDA approved  for NASAL specimens only), is one component of a comprehensive MRSA colonization surveillance program. It is not intended to diagnose MRSA infection nor to guide or monitor treatment for MRSA infections. RESULT CALLED TO, READ BACK BY AND VERIFIED WITH: PULEO,R. RN @1814  07/14/20 BILLINGSLEY,L Performed at [facility], [place] [address] [place], [place] [address]   Resp Panel by RT PCR (RSV, Flu A&B, Covid) - Nasopharyngeal Swab     Status: None   Collection Time: 06/22/2020 11:21 AM   Specimen: Nasopharyngeal Swab  Result Value Ref Range Status   SARS Coronavirus 2 by RT PCR NEGATIVE NEGATIVE Final    Comment: (NOTE) SARS-CoV-2 target nucleic acids are NOT DETECTED.  The SARS-CoV-2 RNA is generally detectable in upper respiratoy specimens during the acute phase of infection. The lowest concentration of SARS-CoV-2 viral copies this assay can detect is 131 copies/mL. A negative result does not preclude SARS-Cov-2 infection and should not be used as the sole basis for treatment or other patient management decisions. A negative result may occur with  improper specimen collection/handling, submission of specimen other than nasopharyngeal swab, presence of viral mutation(s) within the areas targeted by this assay, and inadequate number of viral copies (<131 copies/mL). A negative result must be combined with clinical observations, patient history, and epidemiological information. The expected result is Negative.  Fact Sheet for Patients:  PinkCheek.be  Fact Sheet for Healthcare Providers:  GravelBags.it  This test is no t yet approved or cleared by the [place] FDA and  has been authorized for detection and/or diagnosis of SARS-CoV-2 by FDA under an Emergency Use Authorization (EUA). This EUA will remain  in effect (meaning this test can  be used) for the duration of the COVID-19 declaration under  Section 564(b)(1) of the Act, 21 U.S.C. section 360bbb-3(b)(1), unless the authorization is terminated or revoked sooner.     Influenza A by PCR NEGATIVE NEGATIVE Final   Influenza B by PCR NEGATIVE NEGATIVE Final    Comment: (NOTE) The Xpert Xpress SARS-CoV-2/FLU/RSV assay is intended as an aid in  the diagnosis of influenza from Nasopharyngeal swab specimens and  should not be used as a sole basis for treatment. Nasal washings and  aspirates are unacceptable for Xpert Xpress SARS-CoV-2/FLU/RSV  testing.  Fact Sheet for Patients: PinkCheek.be  Fact Sheet for Healthcare Providers: GravelBags.it  This test is not yet approved or cleared by the [place] FDA and  has been authorized for detection and/or diagnosis of SARS-CoV-2 by  FDA under an Emergency Use Authorization (EUA). This EUA will remain  in effect (meaning this test can be used) for the duration of the  Covid-19 declaration under Section 564(b)(1) of the Act, 21  U.S.C. section 360bbb-3(b)(1), unless the authorization is  terminated or revoked.    Respiratory Syncytial Virus by PCR NEGATIVE NEGATIVE Final    Comment: (NOTE) Fact Sheet for Patients: PinkCheek.be  Fact Sheet for Healthcare Providers: GravelBags.it  This test is not yet approved or cleared by the [place] FDA and  has been authorized for detection and/or diagnosis of SARS-CoV-2 by  FDA under an Emergency Use Authorization (EUA). This EUA will remain  in effect (meaning this test can be used) for the duration of the  COVID-19 declaration under Section 564(b)(1) of the Act, 21 U.S.C.  section 360bbb-3(b)(1), unless the authorization is terminated or  revoked. Performed at [facility], [place] [address], [place], [place] [address]     Labs: Results for orders placed or performed during the hospital encounter  of 07/15/2020 (from the past 48 hour(s))  Prepare RBC (crossmatch)     Status: None   Collection Time: 07/17/2020  9:20 AM  Result Value Ref Range   Order Confirmation      ORDER PROCESSED BY BLOOD BANK Performed at [place] [address] [place], [place] [address]   Resp Panel by RT PCR (RSV, Flu A&B, Covid) - Nasopharyngeal Swab     Status: None   Collection Time: 07/05/2020 11:21 AM   Specimen: Nasopharyngeal Swab  Result Value Ref Range   SARS Coronavirus 2 by RT PCR NEGATIVE NEGATIVE    Comment: (NOTE) SARS-CoV-2 target nucleic acids are NOT DETECTED.  The SARS-CoV-2 RNA is generally detectable in upper respiratoy specimens during the acute phase of infection. The lowest concentration of SARS-CoV-2 viral copies this assay can detect is 131 copies/mL. A negative result does not preclude SARS-Cov-2 infection and should not be used as the sole basis for treatment or other patient management decisions. A negative result may occur with  improper specimen collection/handling, submission of specimen other than nasopharyngeal swab, presence of viral mutation(s) within the areas targeted by this assay, and inadequate number of viral copies (<131 copies/mL). A negative result must be combined with clinical observations, patient history, and epidemiological information. The expected result is Negative.  Fact Sheet for Patients:  PinkCheek.be  Fact Sheet for Healthcare Providers:  GravelBags.it  This test is no t yet approved or cleared by the [place] FDA and  has been authorized for detection and/or diagnosis of SARS-CoV-2 by FDA under an Emergency Use Authorization (EUA). This EUA will remain  in effect (meaning this  test can be used) for the duration of the COVID-19 declaration under Section 564(b)(1) of the Act, 21 U.S.C. section 360bbb-3(b)(1), unless the authorization is terminated or revoked  sooner.     Influenza A by PCR NEGATIVE NEGATIVE   Influenza B by PCR NEGATIVE NEGATIVE    Comment: (NOTE) The Xpert Xpress SARS-CoV-2/FLU/RSV assay is intended as an aid in  the diagnosis of influenza from Nasopharyngeal swab specimens and  should not be used as a sole basis for treatment. Nasal washings and  aspirates are unacceptable for Xpert Xpress SARS-CoV-2/FLU/RSV  testing.  Fact Sheet for Patients: PinkCheek.be  Fact Sheet for Healthcare Providers: GravelBags.it  This test is not yet approved or cleared by the [place] FDA and  has been authorized for detection and/or diagnosis of SARS-CoV-2 by  FDA under an Emergency Use Authorization (EUA). This EUA will remain  in effect (meaning this test can be used) for the duration of the  Covid-19 declaration under Section 564(b)(1) of the Act, 21  U.S.C. section 360bbb-3(b)(1), unless the authorization is  terminated or revoked.    Respiratory Syncytial Virus by PCR NEGATIVE NEGATIVE    Comment: (NOTE) Fact Sheet for Patients: PinkCheek.be  Fact Sheet for Healthcare Providers: GravelBags.it  This test is not yet approved or cleared by the [place] FDA and  has been authorized for detection and/or diagnosis of SARS-CoV-2 by  FDA under an Emergency Use Authorization (EUA). This EUA will remain  in effect (meaning this test can be used) for the duration of the  COVID-19 declaration under Section 564(b)(1) of the Act, 21 U.S.C.  section 360bbb-3(b)(1), unless the authorization is terminated or  revoked. Performed at [facility], [place] [address], [place], [place] [address]   CK total and CKMB (cardiac)not at [facility]     Status: None   Collection Time: 07/03/2020 11:41 AM  Result Value Ref Range   Total CK 385 49.0 - 397.0 U/L   CK, MB 4.2 0.5 - 5.0 ng/mL   Relative Index 1.1 0.0 - 2.5      Comment: Performed at [place] [address], [place], [place] [address]  Glucose, capillary     Status: Abnormal   Collection Time: 07/10/2020 12:07 PM  Result Value Ref Range   Glucose-Capillary 195 (H) 70 - 99 mg/dL    Comment: Glucose reference range applies only to samples taken after fasting for at least 8 hours.  Glucose, capillary     Status: Abnormal   Collection Time: 06/23/2020  4:43 PM  Result Value Ref Range   Glucose-Capillary 205 (H) 70 - 99 mg/dL    Comment: Glucose reference range applies only to samples taken after fasting for at least 8 hours.  Glucose, capillary     Status: Abnormal   Collection Time: 07/03/2020  7:47 PM  Result Value Ref Range   Glucose-Capillary 192 (H) 70 - 99 mg/dL    Comment: Glucose reference range applies only to samples taken after fasting for at least 8 hours.   Comment 1 Notify RN    Comment 2 Document in Chart   Glucose, capillary     Status: Abnormal   Collection Time: 06/27/2020 11:36 PM  Result Value Ref Range   Glucose-Capillary 173 (H) 70 - 99 mg/dL    Comment: Glucose reference range applies only to samples taken after fasting for at least 8 hours.  Glucose, capillary     Status: Abnormal   Collection Time: 07/17/20  4:07  AM  Result Value Ref Range   Glucose-Capillary 179 (H) 70 - 99 mg/dL    Comment: Glucose reference range applies only to samples taken after fasting for at least 8 hours.   Comment 1 Notify RN    Comment 2 Document in Chart   Triglycerides     Status: None   Collection Time: 07/17/20  4:57 AM  Result Value Ref Range   Triglycerides 145 <150 mg/dL    Comment: Performed at [facility], [place] [address], [place], [place] [address]  CBC     Status: Abnormal   Collection Time: 07/17/20  4:57 AM  Result Value Ref Range   WBC 16.0 (H) 4.0 - 10.5 K/uL   RBC 3.01 (L) 4.22 - 5.81 MIL/uL   Hemoglobin 7.6 (L) 13.0 - 17.0 g/dL   HCT 25.2 (L) 39 - 52 %   MCV 83.7 80.0 - 100.0 fL   MCH 25.2  (L) 26.0 - 34.0 pg   MCHC 30.2 30.0 - 36.0 g/dL   RDW 18.1 (H) 11.5 - 15.5 %   Platelets 170 150 - 400 K/uL   nRBC 0.2 0.0 - 0.2 %    Comment: Performed at [facility], [place] [address] [place], [place] [address]  Comprehensive metabolic panel     Status: Abnormal   Collection Time: 07/17/20  4:57 AM  Result Value Ref Range   Sodium 141 135 - 145 mmol/L   Potassium 3.9 3.5 - 5.1 mmol/L   Chloride 111 98 - 111 mmol/L   CO2 24 22 - 32 mmol/L   Glucose, Bld 198 (H) 70 - 99 mg/dL    Comment: Glucose reference range applies only to samples taken after fasting for at least 8 hours.   BUN 26 (H) 8 - 23 mg/dL   Creatinine, Ser 0.55 (L) 0.61 - 1.24 mg/dL   Calcium 7.2 (L) 8.9 - 10.3 mg/dL   Total Protein 4.1 (L) 6.5 - 8.1 g/dL   Albumin 1.1 (L) 3.5 - 5.0 g/dL   AST 149 (H) 15 - 41 U/L   ALT 172 (H) 0 - 44 U/L   Alkaline Phosphatase 139 (H) 38 - 126 U/L   Total Bilirubin 0.6 0.3 - 1.2 mg/dL   GFR calc non Af Amer >60 >60 mL/min   GFR calc Af Amer >60 >60 mL/min   Anion gap 6 5 - 15    Comment: Performed at [facility], [place] [address], [place], [place] [address]  Phosphorus     Status: Abnormal   Collection Time: 07/17/20  4:57 AM  Result Value Ref Range   Phosphorus 2.0 (L) 2.5 - 4.6 mg/dL    Comment: Performed at [facility], [place] [address] [place], [place] [address]  Lactic acid, plasma     Status: None   Collection Time: 07/17/20  4:57 AM  Result Value Ref Range   Lactic Acid, Venous 1.3 0.5 - 1.9 mmol/L    Comment: Performed at [facility] [place] [address] [place], [place] [address]  Magnesium     Status: None   Collection Time: 07/17/20  4:57 AM  Result Value Ref Range   Magnesium 2.0 1.7 - 2.4 mg/dL    Comment: Performed at [facility], [place] [address] [place], [place] [address]  Vancomycin, trough     Status: Abnormal   Collection Time: 07/17/20  4:57 AM  Result Value Ref  Range   Vancomycin Tr  13 (L) 15 - 20 ug/mL    Comment: Performed at [facility], [place] [address] [place], [place] [address]  Glucose, capillary     Status: Abnormal   Collection Time: 07/17/20  8:25 AM  Result Value Ref Range   Glucose-Capillary 191 (H) 70 - 99 mg/dL    Comment: Glucose reference range applies only to samples taken after fasting for at least 8 hours.  Glucose, capillary     Status: Abnormal   Collection Time: 07/17/20 11:40 AM  Result Value Ref Range   Glucose-Capillary 204 (H) 70 - 99 mg/dL    Comment: Glucose reference range applies only to samples taken after fasting for at least 8 hours.   Comment 1 Notify RN    Comment 2 Document in Chart   Glucose, capillary     Status: Abnormal   Collection Time: 07/17/20  4:08 PM  Result Value Ref Range   Glucose-Capillary 203 (H) 70 - 99 mg/dL    Comment: Glucose reference range applies only to samples taken after fasting for at least 8 hours.  Glucose, capillary     Status: Abnormal   Collection Time: 07/17/20  8:43 PM  Result Value Ref Range   Glucose-Capillary 201 (H) 70 - 99 mg/dL    Comment: Glucose reference range applies only to samples taken after fasting for at least 8 hours.  Glucose, capillary     Status: Abnormal   Collection Time: 07/17/20 11:17 PM  Result Value Ref Range   Glucose-Capillary 195 (H) 70 - 99 mg/dL    Comment: Glucose reference range applies only to samples taken after fasting for at least 8 hours.  Triglycerides     Status: None   Collection Time: 07/18/20  3:52 AM  Result Value Ref Range   Triglycerides 111 <150 mg/dL    Comment: Performed at [facility], [place] [address] [place], [place] [address]  CBC     Status: Abnormal   Collection Time: 07/18/20  3:52 AM  Result Value Ref Range   WBC 18.1 (H) 4.0 - 10.5 K/uL   RBC 2.86 (L) 4.22 - 5.81 MIL/uL   Hemoglobin 7.1 (L) 13.0 - 17.0 g/dL   HCT 24.1 (L) 39 - 52 %   MCV 84.3 80.0 - 100.0 fL   MCH 24.8 (L) 26.0 -  34.0 pg   MCHC 29.5 (L) 30.0 - 36.0 g/dL   RDW 18.4 (H) 11.5 - 15.5 %   Platelets 166 150 - 400 K/uL   nRBC 0.2 0.0 - 0.2 %    Comment: Performed at [facility], [place] [address] [place], [place] [address]  Comprehensive metabolic panel     Status: Abnormal   Collection Time: 07/18/20  3:52 AM  Result Value Ref Range   Sodium 139 135 - 145 mmol/L   Potassium 4.0 3.5 - 5.1 mmol/L   Chloride 110 98 - 111 mmol/L   CO2 24 22 - 32 mmol/L   Glucose, Bld 217 (H) 70 - 99 mg/dL    Comment: Glucose reference range applies only to samples taken after fasting for at least 8 hours.   BUN 25 (H) 8 - 23 mg/dL   Creatinine, Ser 0.46 (L) 0.61 - 1.24 mg/dL   Calcium 7.2 (L) 8.9 - 10.3 mg/dL   Total Protein 4.1 (L) 6.5 - 8.1 g/dL   Albumin 1.0 (L) 3.5 - 5.0 g/dL   AST 56 (H) 15 - 41 U/L   ALT 106 (  H) 0 - 44 U/L   Alkaline Phosphatase 169 (H) 38 - 126 U/L   Total Bilirubin 0.3 0.3 - 1.2 mg/dL   GFR calc non Af Amer >60 >60 mL/min   GFR calc Af Amer >60 >60 mL/min   Anion gap 5 5 - 15    Comment: Performed at [facility], [place] [address], [place], [place] [address]  Phosphorus     Status: Abnormal   Collection Time: 07/18/20  3:52 AM  Result Value Ref Range   Phosphorus 2.3 (L) 2.5 - 4.6 mg/dL    Comment: Performed at [facility], [place] [address], [place], [place] [address]  Magnesium     Status: None   Collection Time: 07/18/20  3:52 AM  Result Value Ref Range   Magnesium 1.9 1.7 - 2.4 mg/dL    Comment: Performed at [facility], [place] [address], [place], [place] [address]  CK     Status: None   Collection Time: 07/18/20  3:52 AM  Result Value Ref Range   Total CK 192 49.0 - 397.0 U/L    Comment: Performed at [facility], [place] [address] [place],  [address]  Glucose, capillary     Status: Abnormal   Collection Time: 07/18/20  3:56 AM  Result Value Ref Range   Glucose-Capillary 205 (H) 70 -  99 mg/dL    Comment: Glucose reference range applies only to samples taken after fasting for at least 8 hours.    Imaging / Studies: DG Abd 1 View  Result Date: 07/17/2020 CLINICAL DATA:  Initial evaluation for NG tube placement. EXAM: ABDOMEN - 1 VIEW COMPARISON:  None. FINDINGS: Enteric tube in place with tip overlying the stomach, side hole beyond the GE junction. Visualized bowel gas pattern is nonobstructive. Prominent vascular calcifications noted within the infrarenal aorta. Veiling opacity overlying left hemidiaphragm suggestive of small left pleural effusion. IMPRESSION: 1. Enteric tube in place with tip overlying the stomach, side hole beyond the GE junction. 2. Small left pleural effusion. 3.  Aortic Atherosclerosis (ICD10-I70.0). Electronically Signed   By: Jeannine Boga M.D.   On: 07/17/2020 01:41   DG CHEST PORT 1 VIEW  Result Date: 07/17/2020 CLINICAL DATA:  Intubation. EXAM: PORTABLE CHEST 1 VIEW COMPARISON:  07/10/2020. FINDINGS: Endotracheal tube, NG tube, right PICC line stable position. Stable cardiomegaly. Unchanged bibasilar infiltrates/edema. Persistent left pleural effusion. Right pleural effusion noted on today's exam. No pneumothorax. Carotid vascular calcification. IMPRESSION: 1.  Lines and tubes in stable position. 2.  Stable cardiomegaly. 3. Unchanged bibasilar infiltrates/edema. Persistent left pleural effusion. Right pleural effusion noted on today's exam. Electronically Signed   By: Marcello Moores  Register   On: 07/17/2020 06:41    Medications / Allergies: per chart  Antibiotics: Anti-infectives (From admission, onward)   Start     Dose/Rate Route Frequency Ordered Stop   07/17/20 1600  cefTRIAXone (ROCEPHIN) 2 g in sodium chloride 0.9 % 100 mL IVPB        2 g 200 mL/hr over 30 Minutes Intravenous Every 24 hours 07/17/20 1528     07/17/20 1400  vancomycin (VANCOCIN) IVPB 1000 mg/200 mL premix        1,000 mg 200 mL/hr over 60 Minutes Intravenous Every 8 hours  07/17/20 0944     07/03/2020 1600  ceFEPIme (MAXIPIME) 2 g in sodium chloride 0.9 % 100 mL IVPB  Status:  Discontinued        2 g 200 mL/hr over  30 Minutes Intravenous Every 8 hours 07/08/2020 1537 07/17/20 1528   06/28/2020 1500  clindamycin (CLEOCIN) IVPB 600 mg        600 mg 100 mL/hr over 30 Minutes Intravenous Every 6 hours 07/07/2020 1337 07/18/20 0553   07/02/2020 0600  vancomycin (VANCOREADY) IVPB 750 mg/150 mL  Status:  Discontinued        750 mg 150 mL/hr over 60 Minutes Intravenous Every 8 hours 07/12/2020 2310 07/17/20 0944   07/12/2020 0200  metroNIDAZOLE (FLAGYL) IVPB 500 mg  Status:  Discontinued        500 mg 100 mL/hr over 60 Minutes Intravenous Every 8 hours 07/08/2020 2220 07/13/20 1337   07/13/2020 2315  ceFEPIme (MAXIPIME) 2 g in sodium chloride 0.9 % 100 mL IVPB  Status:  Discontinued        2 g 200 mL/hr over 30 Minutes Intravenous Every 8 hours 06/22/2020 2310 06/24/2020 1537   07/10/2020 2000  vancomycin (VANCOREADY) IVPB 1500 mg/300 mL        1,500 mg 150 mL/hr over 120 Minutes Intravenous  Once 06/26/2020 1834 07/21/2020 2326   07/01/2020 1800  vancomycin (VANCOCIN) IVPB 1000 mg/200 mL premix  Status:  Discontinued        1,000 mg 200 mL/hr over 60 Minutes Intravenous  Once 07/01/2020 1757 07/13/2020 1834   06/25/2020 1800  clindamycin (CLEOCIN) IVPB 600 mg        600 mg 100 mL/hr over 30 Minutes Intravenous  Once 07/16/2020 1759 06/28/2020 1913   07/21/2020 1800  ciprofloxacin (CIPRO) IVPB 400 mg        400 mg 200 mL/hr over 60 Minutes Intravenous  Once 06/21/2020 1759 06/29/2020 2039        Note: Portions of this report may have been transcribed using voice recognition software. Every effort was made to ensure accuracy; however, inadvertent computerized transcription errors may be present.   Any transcriptional errors that result from this process are unintentional.    Adin Hector, MD, FACS, MASCRS Gastrointestinal and Minimally Invasive Surgery  [facility] [address], [place], [place] [address] [phone] Fax [phone] Main/Paging  CONTACT INFORMATION: Weekday (9AM-5PM) concerns: Call [facility] main office at [phone] Weeknight (5PM-9AM) or Weekend/Holiday concerns: Check [email or web address] for General Surgery [facility] coverage (Please, do not use SecureChat as it is not reliable communication to operating surgeons for immediate patient care)      07/18/2020  8:36 AM

## 2020-07-18 NOTE — Progress Notes (Signed)
Echocardiogram 2D Echocardiogram has been performed.  [place] Deputy Radie Berges 07/18/2020, 3:26 PM

## 2020-07-19 ENCOUNTER — Inpatient Hospital Stay (HOSPITAL_COMMUNITY): Payer: Medicare Other

## 2020-07-19 LAB — COMPREHENSIVE METABOLIC PANEL
ALT: 59 U/L — ABNORMAL HIGH (ref 0–44)
AST: 36 U/L (ref 15–41)
Albumin: 1 g/dL — ABNORMAL LOW (ref 3.5–5.0)
Alkaline Phosphatase: 178 U/L — ABNORMAL HIGH (ref 38–126)
Anion gap: 8 (ref 5–15)
BUN: 58 mg/dL — ABNORMAL HIGH (ref 8–23)
CO2: 20 mmol/L — ABNORMAL LOW (ref 22–32)
Calcium: 7 mg/dL — ABNORMAL LOW (ref 8.9–10.3)
Chloride: 111 mmol/L (ref 98–111)
Creatinine, Ser: 1.08 mg/dL (ref 0.61–1.24)
GFR calc Af Amer: >60 mL/min (ref >60)
GFR calc non Af Amer: >60 mL/min (ref >60)
Glucose, Bld: 188 mg/dL — ABNORMAL HIGH (ref 70–99)
Potassium: 4.9 mmol/L (ref 3.5–5.1)
Sodium: 139 mmol/L (ref 135–145)
Total Bilirubin: 0.5 mg/dL (ref 0.3–1.2)
Total Protein: 4.5 g/dL — ABNORMAL LOW (ref 6.5–8.1)

## 2020-07-19 LAB — PROCALCITONIN
Procalcitonin: 1.14 ng/mL
Procalcitonin: 1.11 ng/mL

## 2020-07-19 LAB — GLUCOSE, CAPILLARY
Glucose-Capillary: 152 mg/dL — ABNORMAL HIGH (ref 70–99)
Glucose-Capillary: 157 mg/dL — ABNORMAL HIGH (ref 70–99)
Glucose-Capillary: 219 mg/dL — ABNORMAL HIGH (ref 70–99)
Glucose-Capillary: 228 mg/dL — ABNORMAL HIGH (ref 70–99)
Glucose-Capillary: 265 mg/dL — ABNORMAL HIGH (ref 70–99)
Glucose-Capillary: 301 mg/dL — ABNORMAL HIGH (ref 70–99)

## 2020-07-19 LAB — BASIC METABOLIC PANEL
Anion gap: 7 (ref 5–15)
BUN: 57 mg/dL — ABNORMAL HIGH (ref 8–23)
CO2: 21 mmol/L — ABNORMAL LOW (ref 22–32)
Calcium: 7 mg/dL — ABNORMAL LOW (ref 8.9–10.3)
Chloride: 110 mmol/L (ref 98–111)
Creatinine, Ser: 0.95 mg/dL (ref 0.61–1.24)
GFR calc Af Amer: >60 mL/min (ref >60)
GFR calc non Af Amer: >60 mL/min (ref >60)
Glucose, Bld: 239 mg/dL — ABNORMAL HIGH (ref 70–99)
Potassium: 4.6 mmol/L (ref 3.5–5.1)
Sodium: 138 mmol/L (ref 135–145)

## 2020-07-19 LAB — RESP PANEL BY RT PCR (RSV, FLU A&B, COVID)
Influenza A by PCR: NEGATIVE
Influenza B by PCR: NEGATIVE
Respiratory Syncytial Virus by PCR: NEGATIVE
SARS Coronavirus 2 by RT PCR: NEGATIVE

## 2020-07-19 LAB — MAGNESIUM: Magnesium: 2.1 mg/dL (ref 1.7–2.4)

## 2020-07-19 LAB — CBC
HCT: 26.8 % — ABNORMAL LOW (ref 39.0–52.0)
Hemoglobin: 7.8 g/dL — ABNORMAL LOW (ref 13.0–17.0)
MCH: 24.5 pg — ABNORMAL LOW (ref 26.0–34.0)
MCHC: 29.1 g/dL — ABNORMAL LOW (ref 30.0–36.0)
MCV: 84.3 fL (ref 80.0–100.0)
Platelets: 186 10*3/uL (ref 150–400)
RBC: 3.18 MIL/uL — ABNORMAL LOW (ref 4.22–5.81)
RDW: 19 % — ABNORMAL HIGH (ref 11.5–15.5)
WBC: 23.7 10*3/uL — ABNORMAL HIGH (ref 4.0–10.5)
nRBC: 0.1 % (ref 0.0–0.2)

## 2020-07-19 LAB — PROTIME-INR
INR: 1.2 (ref 0.8–1.2)
Prothrombin Time: 14.7 seconds (ref 11.4–15.2)

## 2020-07-19 LAB — TROPONIN I (HIGH SENSITIVITY): Troponin I (High Sensitivity): 474 ng/L (ref <18)

## 2020-07-19 LAB — TRIGLYCERIDES: Triglycerides: 93 mg/dL (ref <150)

## 2020-07-19 LAB — VANCOMYCIN, TROUGH: Vancomycin Tr: 26 ug/mL (ref 15–20)

## 2020-07-19 LAB — LACTIC ACID, PLASMA: Lactic Acid, Venous: 1.5 mmol/L (ref 0.5–1.9)

## 2020-07-19 LAB — PHOSPHORUS: Phosphorus: 4 mg/dL (ref 2.5–4.6)

## 2020-07-19 IMAGING — DX DG CHEST 1V PORT
2 series · 2 of 2 positions shown · non-contrast
Comparison: [DATE]

CLINICAL DATA: ETT

EXAM:
PORTABLE CHEST 1 VIEW

[chest ap (1 of 2)]
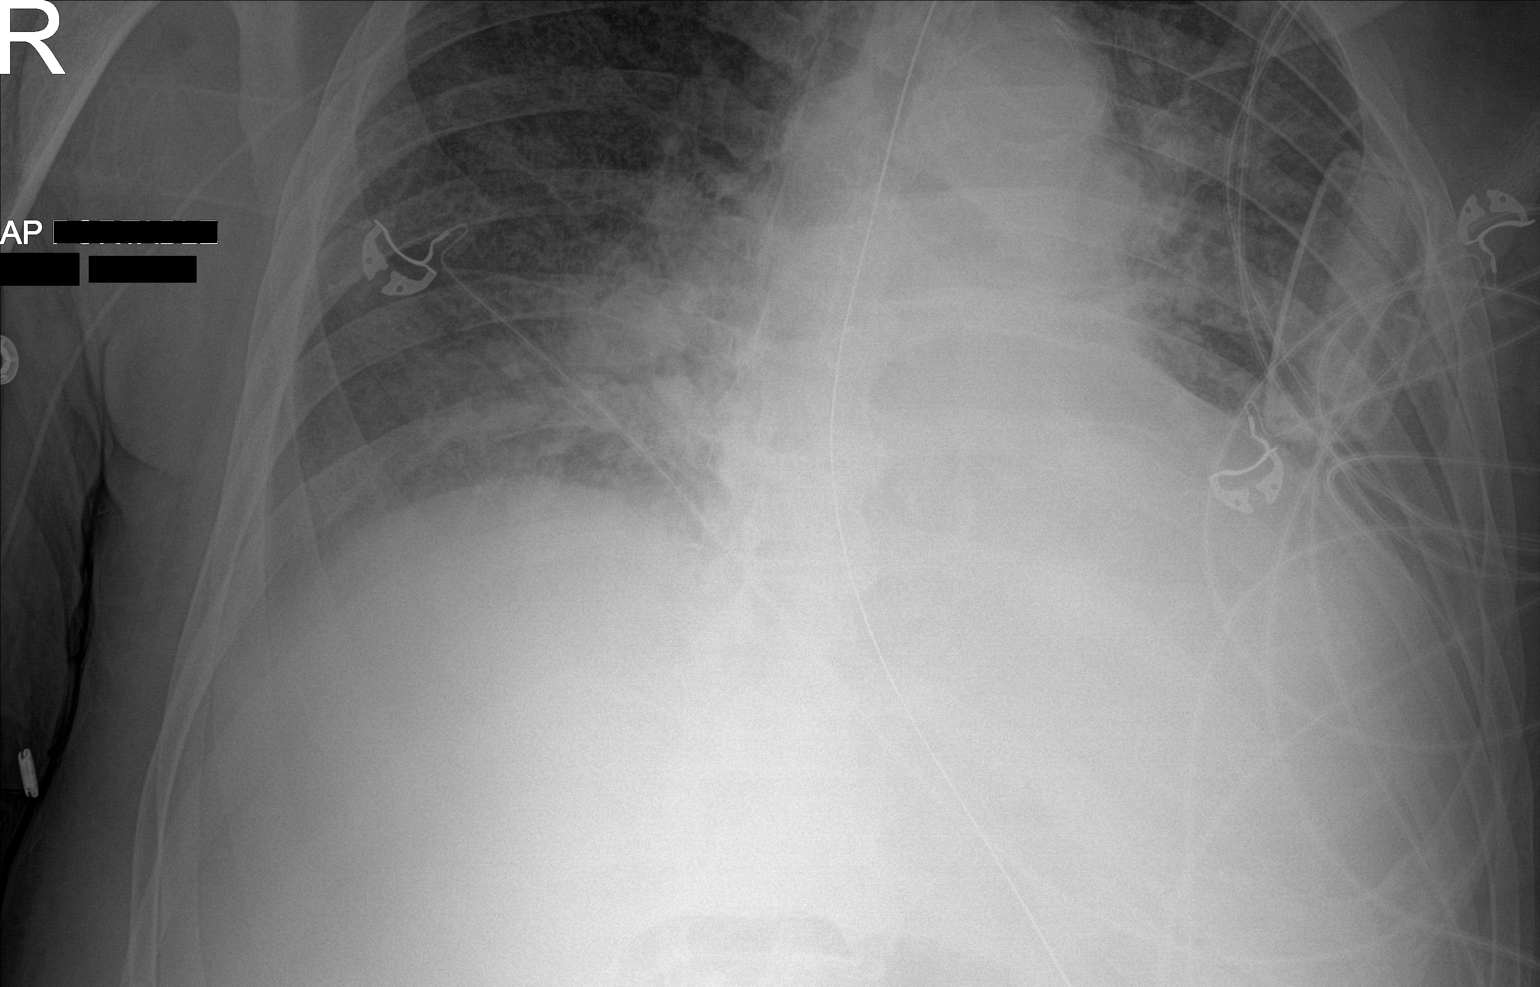

[chest ap (2 of 2)]
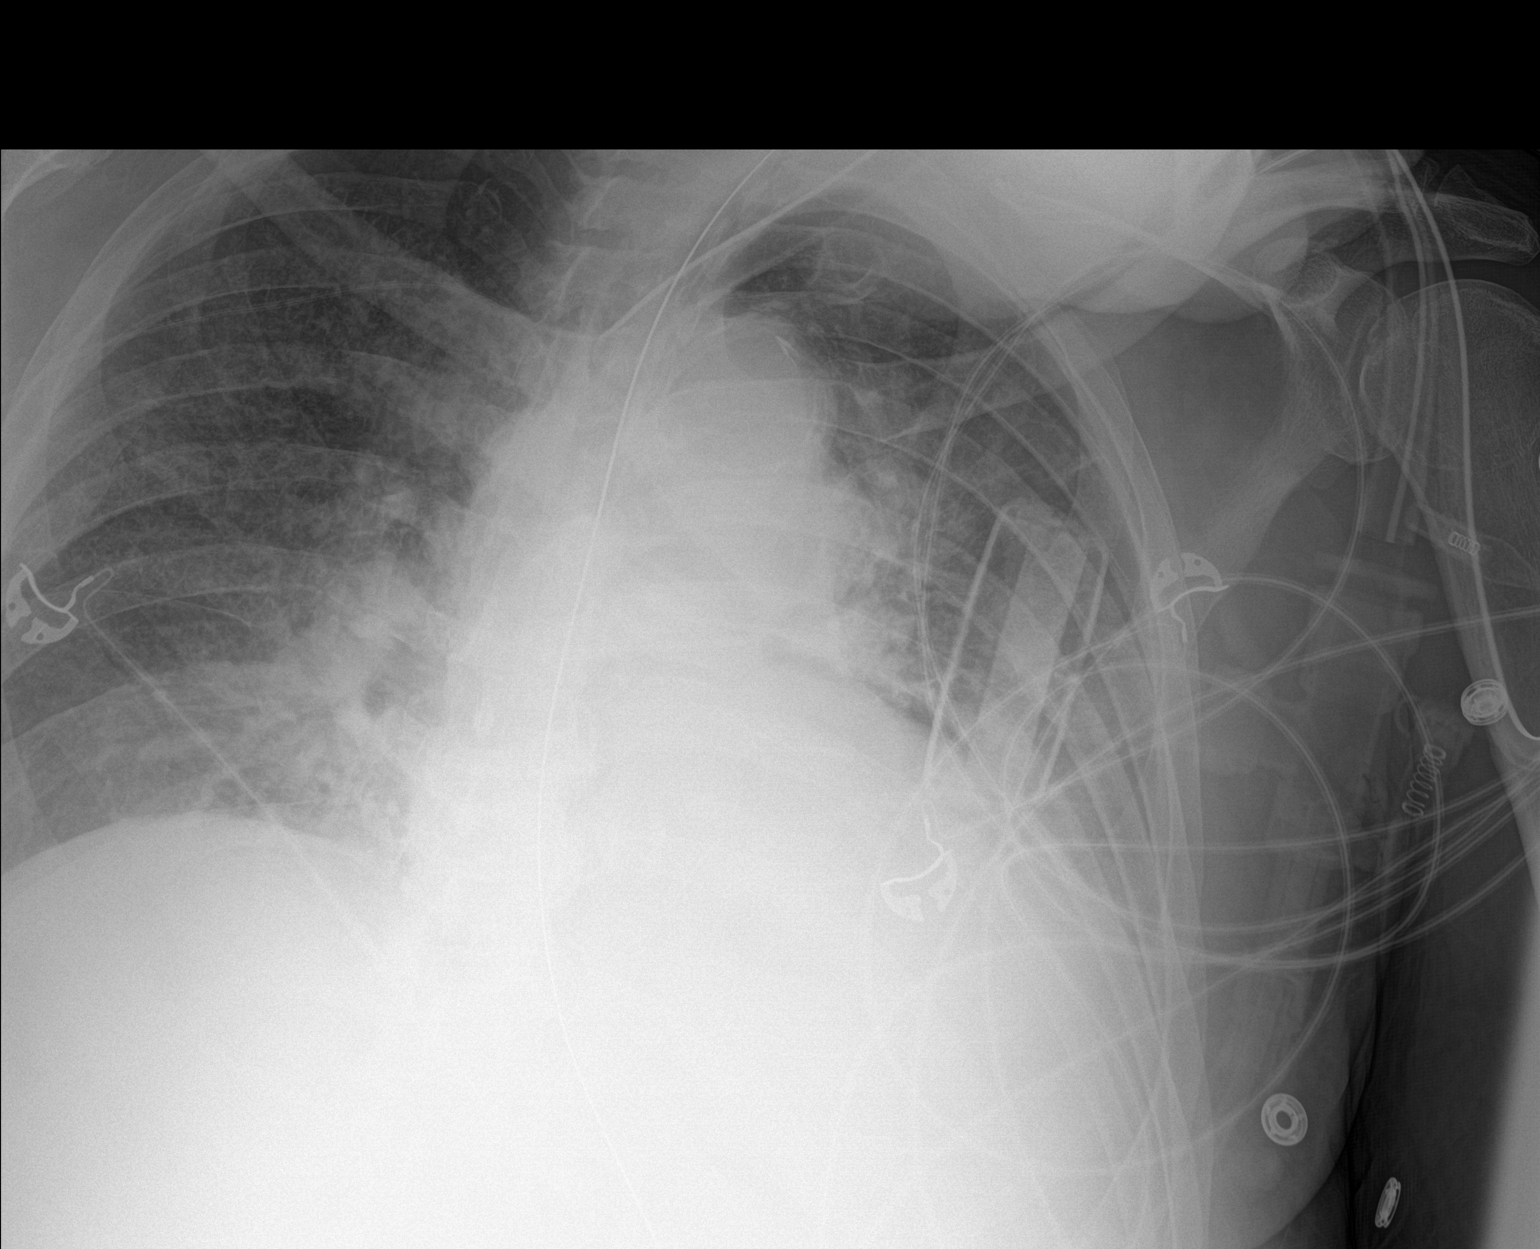

[2 of 2 positions shown; findings below may reference images not displayed]

FINDINGS: The ETT is in good position. A right PICC line is poorly evaluated
due to patient positioning and poor penetration. The NG tube
terminates below today's film. Diffuse interstitial opacities are
stable to mildly more prominent. More focal opacity in left
retrocardiac region identified. Probable layering effusion on the
left. No pneumothorax.
IMPRESSION: 1. Support apparatus as above.
2. Interstitial opacities in the lungs likely represent pulmonary
edema, mildly more prominent the interval.
3. Opacity in left retrocardiac region may represent atelectasis
given the apparent layering effusions seen on today's study and the
previous.

## 2020-07-19 MED ORDER — POLYETHYLENE GLYCOL 3350 17 G PO PACK
34.0000 g | PACK | ORAL | Status: AC
  Administered 2020-07-19: 34 g via ORAL
  Filled 2020-07-19 (×2): qty 2

## 2020-07-19 MED ORDER — ALVIMOPAN 12 MG PO CAPS
12.0000 mg | ORAL_CAPSULE | ORAL | Status: AC
  Administered 2020-07-20: 12 mg via ORAL
  Filled 2020-07-19: qty 1

## 2020-07-19 MED ORDER — CLINDAMYCIN PHOSPHATE 900 MG/50ML IV SOLN
900.0000 mg | INTRAVENOUS | Status: AC
  Administered 2020-07-20: 900 mg via INTRAVENOUS
  Filled 2020-07-19: qty 50

## 2020-07-19 MED ORDER — ACETAMINOPHEN 500 MG PO TABS
1000.0000 mg | ORAL_TABLET | ORAL | Status: AC
  Administered 2020-07-20: 1000 mg via ORAL
  Filled 2020-07-19: qty 2

## 2020-07-19 MED ORDER — VANCOMYCIN HCL IN DEXTROSE 1-5 GM/200ML-% IV SOLN: 1000.0000 mg | Freq: Two times a day (BID) | INTRAVENOUS | Status: DC

## 2020-07-19 MED ORDER — INSULIN ASPART 100 UNIT/ML ~~LOC~~ SOLN
2.0000 [IU] | SUBCUTANEOUS | Status: DC
  Administered 2020-07-19 – 2020-07-26 (×41): 2 [IU] via SUBCUTANEOUS

## 2020-07-19 MED ORDER — ENOXAPARIN SODIUM 40 MG/0.4ML ~~LOC~~ SOLN
40.0000 mg | Freq: Once | SUBCUTANEOUS | Status: AC
  Administered 2020-07-20: 40 mg via SUBCUTANEOUS
  Filled 2020-07-19: qty 0.4

## 2020-07-19 MED ORDER — NEOMYCIN SULFATE 500 MG PO TABS
1000.0000 mg | ORAL_TABLET | ORAL | Status: AC
  Administered 2020-07-19 (×3): 1000 mg via ORAL
  Filled 2020-07-19 (×3): qty 2

## 2020-07-19 MED ORDER — GENTAMICIN SULFATE 40 MG/ML IJ SOLN
5.0000 mg/kg | INTRAVENOUS | Status: AC
  Administered 2020-07-20: 460 mg via INTRAVENOUS
  Filled 2020-07-19: qty 11.5

## 2020-07-19 MED ORDER — METRONIDAZOLE 500 MG PO TABS
1000.0000 mg | ORAL_TABLET | ORAL | Status: AC
  Administered 2020-07-19 (×3): 1000 mg via ORAL
  Filled 2020-07-19 (×3): qty 2

## 2020-07-19 MED ORDER — FUROSEMIDE 10 MG/ML IJ SOLN
80.0000 mg | Freq: Three times a day (TID) | INTRAMUSCULAR | Status: DC
  Administered 2020-07-19 – 2020-07-24 (×17): 80 mg via INTRAVENOUS
  Filled 2020-07-19 (×18): qty 8

## 2020-07-19 MED ORDER — POLYETHYLENE GLYCOL 3350 17 G PO PACK
51.0000 g | PACK | Freq: Once | ORAL | Status: AC
  Administered 2020-07-19: 51 g via ORAL

## 2020-07-19 MED ORDER — LABETALOL HCL 5 MG/ML IV SOLN
5.0000 mg | INTRAVENOUS | Status: DC | PRN
  Administered 2020-07-19 – 2020-07-23 (×6): 10 mg via INTRAVENOUS
  Filled 2020-07-19 (×8): qty 4

## 2020-07-19 MED ORDER — BISACODYL 5 MG PO TBEC
20.0000 mg | DELAYED_RELEASE_TABLET | Freq: Once | ORAL | Status: AC
  Administered 2020-07-19: 20 mg via ORAL
  Filled 2020-07-19: qty 4

## 2020-07-19 MED ORDER — GABAPENTIN 300 MG PO CAPS
300.0000 mg | ORAL_CAPSULE | ORAL | Status: AC
  Administered 2020-07-20: 300 mg via ORAL
  Filled 2020-07-19: qty 1

## 2020-07-19 NOTE — Progress Notes (Signed)
TOMASZ STEEVES [id number] [birth year]  CARE TEAM:  PCP: [name], DO  Outpatient Care Team: Patient Care Team: [name], DO as PCP - General (Family Medicine)  Inpatient Treatment Team: Treatment Team: Attending Provider: Brand Males, MD; Consulting Physician: Virl Cagey, MD; Consulting Physician: Edison Pace, Md, MD; Rounding Team: Pccm, Md, MD; Consulting Physician: Cleon Gustin, MD; Consulting Physician: Lucas Mallow, MD; [place] Nurse: Jeannie Fend, FNP; Utilization Review: Alease Medina, RN; Technician: Arelia Sneddon, NT   Problem List:   Principal Problem:   Sepsis [facility]) Active Problems:   Diabetic neuropathy, type II diabetes mellitus ([place])   Hypertension associated with diabetes ([place])   DM (diabetes mellitus), type 2, uncontrolled ([place])   BPH (benign prostatic hyperplasia)   Right ischial pressure sore, unstageable ([place])   Right above-knee amputee ([place])   Necrotizing fasciitis ([place])   Fournier's gangrene of scrotum   Surgical wound infection   Acute respiratory failure with hypoxia ([place])   Dehiscence of amputation stump ([place])   3 Days Post-Op  06/28/2020  06/22/2020  PATIENT:  Jackquline Bosch  69 y.o. male  PRE-OPERATIVE DIAGNOSIS:  NECROTIXING FASCIITIS  POST-OPERATIVE DIAGNOSIS:  [place]:  Procedure(s): DEBRIDEMENT OF SOFT TISSUE INFECTION (N/A)  SURGEON:  Surgeon(s) and Role:    Ralene Ok, MD - Primary  06/30/2020  12:05 PM  PATIENT:  Jackquline Bosch  69 y.o. male  PRE-OPERATIVE DIAGNOSIS:  NECROTIZING FASCIITIS  POST-OPERATIVE DIAGNOSIS:  NECROTIZING FASCIITIS  PROCEDURE:  Procedure(s): INCISION AND DRAINAGE, IRRIGATION AND DEBRIDEMENT SOFT TISSUE INFECTION NECROTIZING FASCIITIS (N/A)  SURGEON:  Surgeon(s) and Role:    Ralene Ok, MD - Primary    * Franchot Gallo, MD - Assisting   Operative Note  06/26/2020  Preoperative Diagnosis:   Right ischial decubitus ulcer, unstageable and subcutaneous gas concerning for necrotizing infection    Postoperative Diagnosis:  Necrotizing Infection/ Fournier's Gangrene of right buttock, right inguinal region and right scrotum; Right Above Knee Amputation Incision Infection   Procedure(s) Performed:  Hervey Ard excisional debridement of right buttock, right inguinal region, and right scrotal skin measuring (right buttock / right inguinal wound 20cmX10cmX4cm and right scrotal skin 15cmX8cmX2cm); Drainage and packing of Right AKA wound infection    Surgeon: Lanell Matar. Constance Haw, MD  06/19/2020   PATIENT:  Jackquline Bosch    PRE-OPERATIVE DIAGNOSIS:  RIGHT BELOW KNEE AMPUTATION WOUND dehiscence  POST-OPERATIVE DIAGNOSIS:  Same  PROCEDURE:  RIGHT ABOVE KNEE AMPUTATION Application VAC  SURGEON:  Newt Minion, MD   Sepsis with necrotizing fasciitis/hypotension Ventilator dependent respiratory failure Anemia COPD Type 2 diabetes with neuropathy Hypertension Elevated transaminase - AST 1151/ALT 452 COVID-19 exposure - family member tested positive, retest per ID, continue precautions per ID  Right AKA 7/30-Dr. Sharol Given Right above-the-knee amputation incision infection S/p debridement by Dr. Constance Haw 07/04/2020 - MRSA + - management per Dr. 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 Constance Haw 07/07/2020 S/p debridement by Dr. Rosendo Gros and Dr. Diona Fanti 06/30/2020 S/p debridement by Dr. Rosendo Gros 06/24/2020 - dressing changed at bedside this AM,  NS dressing changes - cx from ischial wound Klebsiella oxytoca Streptococcus constellatus    - WBC elevated, afebrile -Klebsiella sensitive to cefepime.  Doubt of need to continue clindamycin at this point.  Defer to critical care. - continue to off-load pressure as able -Septic shock resolved.  Now w HTN - Hypertensive management per critical  care -Hold off  any more soft tissue debridement this weekend.  Multisystem organ failure stabilizing/improving  - Patient will need diverting loop colostomy for fecal diversion given massive soft tissue infection and necrosis limited mobility with amputation to allow for wound healing - CCS called and discussed this with patient's sister, [name], on 8/27, who is agreeable to this plan  Orders placed & case posted tenetively for Monday 8/30 midmorning. Dr. Kieth Brightly to assume surgical care and will have final discussion Monday morning. We will try modified bowel prep to help cleanout since rectal tube in place  FEN: TF at goal.  Follow nutritional numbers., IVF, NPO JQ:BHALP x 18/22; Flagyl x 2 8/23; Vancomycin 8/22>>, Maxipime/Clindamycin 8/23>>   Endo: Diabetic management as possible.  Try to balance for nutrition with need for management of hyperglycemia FXT:[id number]   LOS: 7 days    25 minutes spent in review, evaluation, examination, counseling, and coordination of care.  More than 50% of that time was spent in counseling.  07/19/2020    Subjective: (Chief complaint)  Remains intubated.  Off pressors  Still with oxygen requirements.  Objective:  Vital signs:  Vitals:   07/19/20 0308 07/19/20 0500 07/19/20 0600 07/19/20 0700  BP:  (!) 196/70 (!) 161/62 (!) 137/47  Pulse:  (!) 105 82 79  Resp:  (!) 23 20 18   Temp:      TempSrc:      SpO2: 97% 95% 93% 94%  Weight:      Height:        Last BM Date: 07/18/20  Intake/Output   Yesterday:  08/28 0701 - 08/29 0700 In: 3203.3 [I.V.:1326.3; NG/GT:1195.8; IV Piggyback:681.1] Out: 1675 [Urine:1675] This shift:  No intake/output data recorded.  Bowel function:  Flatus: YES  BM:  YES -rectal pouch seal in place  Drain:    Physical Exam:  General: Pt intubated and sedated in moderate acute distress Eyes: PERRL, normal EOM.  Sclera clear.  No icterus Neuro: CN II-XII intact w/o focal sensory/motor  deficits. Lymph: No head/neck/groin lymphadenopathy Psych:  No delerium/psychosis/paranoia.  HENT: Normocephalic, Mucus membranes moist.  No thrush Neck: Supple, No tracheal deviation.  No obvious thyromegaly Chest: No pain to chest wall compression.  Good respiratory excursion.  No audible wheezing CV:  Pulses intact.  Regular rhythm.  No major extremity edema MS: Normal AROM mjr joints.  No obvious deformity  Abdomen: Soft.  Nondistended.  Nontender.  No evidence of peritonitis.  No incarcerated hernias.  Scrotum pink with decent granulation tissue and large wounds. Moderate soft tissue necrosis perianally. Limited view today confirmed by critical care nursing.  Ext: Right above-knee amputation dressing intact.   No mjr edema.  No cyanosis Skin: No petechiae / purpurea.  No major sores.  Warm and dry    Results:   Cultures: Recent Results (from the past 720 hour(s))  Urine culture     Status: Abnormal   Collection Time: 06/23/2020  5:57 PM   Specimen: In/Out Cath Urine  Result Value Ref Range Status   Specimen Description   Final    IN/OUT CATH URINE Performed at [facility] [address], [place], [place] [address]    Special Requests   Final    NONE Performed at [facility], [address], [place], [place] [address]    Culture MULTIPLE SPECIES PRESENT, SUGGEST RECOLLECTION (A)  Final   Report Status 07/13/2020 FINAL  Final  Culture, blood (Routine x 2)     Status: None  Collection Time: 07/15/2020  6:03 PM   Specimen: BLOOD  Result Value Ref Range Status   Specimen Description BLOOD RIGHT HAND  Final   Special Requests   Final    BOTTLES DRAWN AEROBIC AND ANAEROBIC Blood Culture adequate volume   Culture   Final    NO GROWTH 5 DAYS Performed at [facility], [address], [place], [place] [address]    Report Status 07/17/2020 FINAL  Final  Culture, blood (Routine x 2)     Status: None   Collection Time: 07/06/2020  6:03 PM   Specimen: BLOOD  Result Value Ref Range  Status   Specimen Description BLOOD LEFT HAND  Final   Special Requests   Final    BOTTLES DRAWN AEROBIC AND ANAEROBIC Blood Culture adequate volume   Culture   Final    NO GROWTH 5 DAYS Performed at [facility], [address], [place], [place] [address]    Report Status 07/17/2020 FINAL  Final  Wound or Superficial Culture     Status: Abnormal   Collection Time: 07/05/2020  6:58 PM   Specimen: Ulcer; Wound  Result Value Ref Range Status   Specimen Description   Final    ULCER Performed at [facility], [address] [place], [place] [address]    Special Requests   Final    NONE Performed at [facility], [address], [place], [place] [address]    Gram Stain   Final    NO WBC SEEN MODERATE GRAM POSITIVE RODS FEW GRAM POSITIVE COCCI FEW GRAM NEGATIVE RODS    Culture (A)  Final    MULTIPLE ORGANISMS PRESENT, NONE PREDOMINANT NO STAPHYLOCOCCUS AUREUS ISOLATED NO GROUP A STREP (S.PYOGENES) ISOLATED Performed at [facility], [place] [address] [place], [place] [address]    Report Status 07/17/2020 FINAL  Final  SARS Coronavirus 2 by RT PCR (hospital order, performed in [facility] hospital lab) Nasopharyngeal Nasopharyngeal Swab     Status: None   Collection Time: 07/03/2020  7:21 PM   Specimen: Nasopharyngeal Swab  Result Value Ref Range Status   SARS Coronavirus 2 NEGATIVE NEGATIVE Final    Comment: (NOTE) SARS-CoV-2 target nucleic acids are NOT DETECTED.  The SARS-CoV-2 RNA is generally detectable in upper and lower respiratory specimens during the acute phase of infection. The lowest concentration of SARS-CoV-2 viral copies this assay can detect is 250 copies / mL. A negative result does not preclude SARS-CoV-2 infection and should not be used as the sole basis for treatment or other patient management decisions.  A negative result may occur with improper specimen collection / handling, submission of specimen other than nasopharyngeal swab, presence of viral  mutation(s) within the areas targeted by this assay, and inadequate number of viral copies (<250 copies / mL). A negative result must be combined with clinical observations, patient history, and epidemiological information.  Fact Sheet for Patients:   StrictlyIdeas.no  Fact Sheet for Healthcare Providers: BankingDealers.co.za  This test is not yet approved or  cleared by the [place] FDA and has been authorized for detection and/or diagnosis of SARS-CoV-2 by FDA under an Emergency Use Authorization (EUA).  This EUA will remain in effect (meaning this test can be used) for the duration of the COVID-19 declaration under Section 564(b)(1) of the Act, 21 U.S.C. section 360bbb-3(b)(1), unless the authorization is terminated or revoked sooner.  Performed at [facility], [address], [place], [place] [address]   Aerobic/Anaerobic Culture (surgical/deep wound)  Status: Abnormal   Collection Time: 07/18/2020  9:30 AM   Specimen: Abscess; Wound  Result Value Ref Range Status   Specimen Description   Final    ABSCESS SACRUM Performed at [facility], [address], [place], [place] [address]    Special Requests   Final    NONE Performed at [facility], [address] [place], [place] [address]    Gram Stain   Final    RARE WBC PRESENT,BOTH PMN AND MONONUCLEAR ABUNDANT GRAM NEGATIVE RODS MODERATE GRAM POSITIVE COCCI FEW GRAM VARIABLE ROD Performed at [facility], [place] [address], [place], [place] [address]    Culture (A)  Final    MULTIPLE ORGANISMS PRESENT, NONE PREDOMINANT NO STAPHYLOCOCCUS AUREUS ISOLATED NO GROUP A STREP (S.PYOGENES) ISOLATED MIXED ANAEROBIC FLORA PRESENT.  CALL LAB IF FURTHER IID REQUIRED.    Report Status 07/18/2020 FINAL  Final  Aerobic/Anaerobic Culture (surgical/deep wound)     Status: None   Collection Time: 07/12/2020  2:33 PM   Specimen: Wound  Result Value Ref Range Status   Specimen  Description   Final    WOUND INGUINAL Performed at [facility], [address] [place], [place] [address]    Special Requests   Final    NONE Performed at [facility], [address] [place], [place] [address]    Gram Stain   Final    RARE WBC PRESENT, PREDOMINANTLY PMN ABUNDANT GRAM POSITIVE COCCI IN PAIRS MODERATE GRAM POSITIVE RODS FEW GRAM NEGATIVE RODS    Culture   Final    RARE KLEBSIELLA OXYTOCA RARE STREPTOCOCCUS CONSTELLATUS NO ANAEROBES ISOLATED Performed at [facility], [address], [place], [place] [address]    Report Status 07/18/2020 FINAL  Final   Organism ID, Bacteria KLEBSIELLA OXYTOCA  Final   Organism ID, Bacteria STREPTOCOCCUS CONSTELLATUS  Final      Susceptibility   Streptococcus constellatus - MIC*    PENICILLIN INTERMEDIATE Intermediate     CEFTRIAXONE 2 INTERMEDIATE Intermediate     ERYTHROMYCIN >=8 RESISTANT Resistant     LEVOFLOXACIN <=0.25 SENSITIVE Sensitive     VANCOMYCIN 0.5 SENSITIVE Sensitive     * RARE STREPTOCOCCUS CONSTELLATUS   Klebsiella oxytoca - MIC*    AMPICILLIN RESISTANT Resistant     CEFAZOLIN <=4 SENSITIVE Sensitive     CEFEPIME <=0.12 SENSITIVE Sensitive     CEFTAZIDIME <=1 SENSITIVE Sensitive     CEFTRIAXONE <=0.25 SENSITIVE Sensitive     CIPROFLOXACIN <=0.25 SENSITIVE Sensitive     GENTAMICIN <=1 SENSITIVE Sensitive     IMIPENEM <=0.25 SENSITIVE Sensitive     TRIMETH/SULFA <=20 SENSITIVE Sensitive     AMPICILLIN/SULBACTAM 4 SENSITIVE Sensitive     PIP/TAZO <=4 SENSITIVE Sensitive     * RARE KLEBSIELLA OXYTOCA  Aerobic/Anaerobic Culture (surgical/deep wound)     Status: Abnormal   Collection Time: 07/12/2020  2:33 PM   Specimen: Wound  Result Value Ref Range Status   Specimen Description   Final    WOUND ISCHIAL Performed at [facility], [address] [place], [place] [address]    Special Requests   Final    NONE Performed at [facility], [address], [place], [place] [address]    Gram Stain   Final     RARE WBC PRESENT, PREDOMINANTLY PMN MODERATE GRAM POSITIVE COCCI FEW GRAM POSITIVE RODS FEW GRAM NEGATIVE RODS Performed at [facility], [address], [place],  [address]    Culture (A)  Final    STREPTOCOCCUS AGALACTIAE TESTING AGAINST S. AGALACTIAE NOT ROUTINELY PERFORMED DUE TO PREDICTABILITY OF AMP/PEN/VAN SUSCEPTIBILITY. MIXED ANAEROBIC FLORA PRESENT.  CALL LAB IF FURTHER IID REQUIRED.    Report Status 07/18/2020 FINAL  Final  Aerobic/Anaerobic Culture (surgical/deep wound)     Status: None   Collection Time: 07/05/2020  2:33 PM   Specimen: Wound  Result Value Ref Range Status   Specimen Description   Final    WOUND AKA STUMP Performed at [facility], [address] [place], [place] [address]    Special Requests   Final    NONE Performed at [facility], [address], [place], [place] [address]    Gram Stain   Final    ABUNDANT WBC PRESENT, PREDOMINANTLY PMN RARE GRAM POSITIVE COCCI    Culture   Final    RARE METHICILLIN RESISTANT STAPHYLOCOCCUS AUREUS RARE GROUP B STREP(S.AGALACTIAE)ISOLATED TESTING AGAINST S. AGALACTIAE NOT ROUTINELY PERFORMED DUE TO PREDICTABILITY OF AMP/PEN/VAN SUSCEPTIBILITY. NO ANAEROBES ISOLATED Performed at [facility], [place] [address], [place], [place] [address]    Report Status 07/18/2020 FINAL  Final   Organism ID, Bacteria METHICILLIN RESISTANT STAPHYLOCOCCUS AUREUS  Final      Susceptibility   Methicillin resistant staphylococcus aureus - MIC*    CIPROFLOXACIN >=8 RESISTANT Resistant     ERYTHROMYCIN >=8 RESISTANT Resistant     GENTAMICIN <=0.5 SENSITIVE Sensitive     OXACILLIN >=4 RESISTANT Resistant     TETRACYCLINE <=1 SENSITIVE Sensitive     VANCOMYCIN 1 SENSITIVE Sensitive     TRIMETH/SULFA >=320 RESISTANT Resistant     CLINDAMYCIN <=0.25 SENSITIVE Sensitive     RIFAMPIN <=0.5 SENSITIVE Sensitive     Inducible Clindamycin NEGATIVE Sensitive     * RARE METHICILLIN RESISTANT STAPHYLOCOCCUS AUREUS  MRSA  PCR Screening     Status: Abnormal   Collection Time: 07/14/20 12:33 PM   Specimen: Nasal Mucosa; Nasopharyngeal  Result Value Ref Range Status   MRSA by PCR POSITIVE (A) NEGATIVE Final    Comment:        The GeneXpert MRSA Assay (FDA approved for NASAL specimens only), is one component of a comprehensive MRSA colonization surveillance program. It is not intended to diagnose MRSA infection nor to guide or monitor treatment for MRSA infections. RESULT CALLED TO, READ BACK BY AND VERIFIED WITH: PULEO,R. RN @1814  07/14/20 BILLINGSLEY,L Performed at [facility], [place] [address] [place], [place] [address]   Resp Panel by RT PCR (RSV, Flu A&B, Covid) - Nasopharyngeal Swab     Status: None   Collection Time: 06/29/2020 11:21 AM   Specimen: Nasopharyngeal Swab  Result Value Ref Range Status   SARS Coronavirus 2 by RT PCR NEGATIVE NEGATIVE Final    Comment: (NOTE) SARS-CoV-2 target nucleic acids are NOT DETECTED.  The SARS-CoV-2 RNA is generally detectable in upper respiratoy specimens during the acute phase of infection. The lowest concentration of SARS-CoV-2 viral copies this assay can detect is 131 copies/mL. A negative result does not preclude SARS-Cov-2 infection and should not be used as the sole basis for treatment or other patient management decisions. A negative result may occur with  improper specimen collection/handling, submission of specimen other than nasopharyngeal swab, presence of viral mutation(s) within the areas targeted by this assay, and inadequate number of viral copies (<131 copies/mL). A negative result must be combined with clinical observations, patient history, and epidemiological information. The expected result is Negative.  Fact Sheet for Patients:  PinkCheek.be  Fact  Sheet for Healthcare Providers:  GravelBags.it  This test is no t yet approved or cleared by the [place]  FDA and  has been authorized for detection and/or diagnosis of SARS-CoV-2 by FDA under an Emergency Use Authorization (EUA). This EUA will remain  in effect (meaning this test can be used) for the duration of the COVID-19 declaration under Section 564(b)(1) of the Act, 21 U.S.C. section 360bbb-3(b)(1), unless the authorization is terminated or revoked sooner.     Influenza A by PCR NEGATIVE NEGATIVE Final   Influenza B by PCR NEGATIVE NEGATIVE Final    Comment: (NOTE) The Xpert Xpress SARS-CoV-2/FLU/RSV assay is intended as an aid in  the diagnosis of influenza from Nasopharyngeal swab specimens and  should not be used as a sole basis for treatment. Nasal washings and  aspirates are unacceptable for Xpert Xpress SARS-CoV-2/FLU/RSV  testing.  Fact Sheet for Patients: PinkCheek.be  Fact Sheet for Healthcare Providers: GravelBags.it  This test is not yet approved or cleared by the [place] FDA and  has been authorized for detection and/or diagnosis of SARS-CoV-2 by  FDA under an Emergency Use Authorization (EUA). This EUA will remain  in effect (meaning this test can be used) for the duration of the  Covid-19 declaration under Section 564(b)(1) of the Act, 21  U.S.C. section 360bbb-3(b)(1), unless the authorization is  terminated or revoked.    Respiratory Syncytial Virus by PCR NEGATIVE NEGATIVE Final    Comment: (NOTE) Fact Sheet for Patients: PinkCheek.be  Fact Sheet for Healthcare Providers: GravelBags.it  This test is not yet approved or cleared by the [place] FDA and  has been authorized for detection and/or diagnosis of SARS-CoV-2 by  FDA under an Emergency Use Authorization (EUA). This EUA will remain  in effect (meaning this test can be used) for the duration of the  COVID-19 declaration under Section 564(b)(1) of the Act, 21 U.S.C.  section  360bbb-3(b)(1), unless the authorization is terminated or  revoked. Performed at [facility], [place] [address] [place], [place] [address]     Labs: Results for orders placed or performed during the hospital encounter of 07/14/2020 (from the past 48 hour(s))  Glucose, capillary     Status: Abnormal   Collection Time: 07/17/20  8:25 AM  Result Value Ref Range   Glucose-Capillary 191 (H) 70 - 99 mg/dL    Comment: Glucose reference range applies only to samples taken after fasting for at least 8 hours.  Glucose, capillary     Status: Abnormal   Collection Time: 07/17/20 11:40 AM  Result Value Ref Range   Glucose-Capillary 204 (H) 70 - 99 mg/dL    Comment: Glucose reference range applies only to samples taken after fasting for at least 8 hours.   Comment 1 Notify RN    Comment 2 Document in Chart   Glucose, capillary     Status: Abnormal   Collection Time: 07/17/20  4:08 PM  Result Value Ref Range   Glucose-Capillary 203 (H) 70 - 99 mg/dL    Comment: Glucose reference range applies only to samples taken after fasting for at least 8 hours.  Glucose, capillary     Status: Abnormal   Collection Time: 07/17/20  8:43 PM  Result Value Ref Range   Glucose-Capillary 201 (H) 70 - 99 mg/dL    Comment: Glucose reference range applies only to samples taken after fasting for at least 8 hours.  Glucose, capillary     Status: Abnormal   Collection  Time: 07/17/20 11:17 PM  Result Value Ref Range   Glucose-Capillary 195 (H) 70 - 99 mg/dL    Comment: Glucose reference range applies only to samples taken after fasting for at least 8 hours.  Triglycerides     Status: None   Collection Time: 07/18/20  3:52 AM  Result Value Ref Range   Triglycerides 111 <150 mg/dL    Comment: Performed at [facility], [place] [address], [place], [place] [address]  CBC     Status: Abnormal   Collection Time: 07/18/20  3:52 AM  Result Value Ref Range   WBC 18.1 (H) 4.0 - 10.5 K/uL    RBC 2.86 (L) 4.22 - 5.81 MIL/uL   Hemoglobin 7.1 (L) 13.0 - 17.0 g/dL   HCT 24.1 (L) 39 - 52 %   MCV 84.3 80.0 - 100.0 fL   MCH 24.8 (L) 26.0 - 34.0 pg   MCHC 29.5 (L) 30.0 - 36.0 g/dL   RDW 18.4 (H) 11.5 - 15.5 %   Platelets 166 150 - 400 K/uL   nRBC 0.2 0.0 - 0.2 %    Comment: Performed at [facility], [place] [address] [place], [place] [address]  Comprehensive metabolic panel     Status: Abnormal   Collection Time: 07/18/20  3:52 AM  Result Value Ref Range   Sodium 139 135 - 145 mmol/L   Potassium 4.0 3.5 - 5.1 mmol/L   Chloride 110 98 - 111 mmol/L   CO2 24 22 - 32 mmol/L   Glucose, Bld 217 (H) 70 - 99 mg/dL    Comment: Glucose reference range applies only to samples taken after fasting for at least 8 hours.   BUN 25 (H) 8 - 23 mg/dL   Creatinine, Ser 0.46 (L) 0.61 - 1.24 mg/dL   Calcium 7.2 (L) 8.9 - 10.3 mg/dL   Total Protein 4.1 (L) 6.5 - 8.1 g/dL   Albumin 1.0 (L) 3.5 - 5.0 g/dL   AST 56 (H) 15 - 41 U/L   ALT 106 (H) 0 - 44 U/L   Alkaline Phosphatase 169 (H) 38 - 126 U/L   Total Bilirubin 0.3 0.3 - 1.2 mg/dL   GFR calc non Af Amer >60 >60 mL/min   GFR calc Af Amer >60 >60 mL/min   Anion gap 5 5 - 15    Comment: Performed at [facility], [place] [address], [place], [place] [address]  Phosphorus     Status: Abnormal   Collection Time: 07/18/20  3:52 AM  Result Value Ref Range   Phosphorus 2.3 (L) 2.5 - 4.6 mg/dL    Comment: Performed at [facility], [place] [address] [place], [place] [address]  Magnesium     Status: None   Collection Time: 07/18/20  3:52 AM  Result Value Ref Range   Magnesium 1.9 1.7 - 2.4 mg/dL    Comment: Performed at [facility], [place] [address], [place], [place] [address]  CK     Status: None   Collection Time: 07/18/20  3:52 AM  Result Value Ref Range   Total CK 192 49.0 - 397.0 U/L    Comment: Performed at [facility], [place] [address] [place],  [place] [address]  Glucose, capillary     Status: Abnormal   Collection Time: 07/18/20  3:56 AM  Result Value Ref Range   Glucose-Capillary 205 (H) 70 - 99 mg/dL    Comment: Glucose reference range applies only  to samples taken after fasting for at least 8 hours.  Glucose, capillary     Status: Abnormal   Collection Time: 07/18/20  7:39 AM  Result Value Ref Range   Glucose-Capillary 225 (H) 70 - 99 mg/dL    Comment: Glucose reference range applies only to samples taken after fasting for at least 8 hours.  Glucose, capillary     Status: Abnormal   Collection Time: 07/18/20 11:34 AM  Result Value Ref Range   Glucose-Capillary 255 (H) 70 - 99 mg/dL    Comment: Glucose reference range applies only to samples taken after fasting for at least 8 hours.   Comment 1 Notify RN    Comment 2 Document in Chart   Glucose, capillary     Status: Abnormal   Collection Time: 07/18/20  4:42 PM  Result Value Ref Range   Glucose-Capillary 309 (H) 70 - 99 mg/dL    Comment: Glucose reference range applies only to samples taken after fasting for at least 8 hours.   Comment 1 Notify RN    Comment 2 Document in Chart   Glucose, capillary     Status: Abnormal   Collection Time: 07/18/20 11:02 PM  Result Value Ref Range   Glucose-Capillary 269 (H) 70 - 99 mg/dL    Comment: Glucose reference range applies only to samples taken after fasting for at least 8 hours.  Glucose, capillary     Status: Abnormal   Collection Time: 07/19/20  5:49 AM  Result Value Ref Range   Glucose-Capillary 301 (H) 70 - 99 mg/dL    Comment: Glucose reference range applies only to samples taken after fasting for at least 8 hours.  Triglycerides     Status: None   Collection Time: 07/19/20  6:28 AM  Result Value Ref Range   Triglycerides 93 <150 mg/dL    Comment: Performed at [facility], [place] [address], [place], [place] [address]  CBC     Status: Abnormal   Collection Time: 07/19/20  6:28 AM  Result Value Ref Range    WBC 23.7 (H) 4.0 - 10.5 K/uL   RBC 3.18 (L) 4.22 - 5.81 MIL/uL   Hemoglobin 7.8 (L) 13.0 - 17.0 g/dL   HCT 26.8 (L) 39 - 52 %   MCV 84.3 80.0 - 100.0 fL   MCH 24.5 (L) 26.0 - 34.0 pg   MCHC 29.1 (L) 30.0 - 36.0 g/dL   RDW 19.0 (H) 11.5 - 15.5 %   Platelets 186 150 - 400 K/uL   nRBC 0.1 0.0 - 0.2 %    Comment: Performed at [facility], [place] [address], [place], [place] [address]  Protime-INR     Status: None   Collection Time: 07/19/20  6:28 AM  Result Value Ref Range   Prothrombin Time 14.7 11.4 - 15.2 seconds   INR 1.2 0.8 - 1.2    Comment: (NOTE) INR goal varies based on device and disease states. Performed at [facility], [place] [address], [place], [place] [address]     Imaging / Studies: DG CHEST PORT 1 VIEW  Result Date: 07/18/2020 CLINICAL DATA:  Respiratory failure EXAM: PORTABLE CHEST 1 VIEW COMPARISON:  July 17, 2020 FINDINGS: The ETT and right PICC line are stable. The NG tube terminates below today's film. No pneumothorax. Bilateral layering effusions with associated underlying opacities are similar in the interval. The infiltrate may be mildly worse in the right base in the interval. No pneumothorax. No change  in the cardiomediastinal silhouette with persistent cardiomegaly. IMPRESSION: 1. Support apparatus as above. 2. Bibasilar infiltrates/edema with probable associated effusions are similar in the interval. The infiltrate may be slightly worsened on the right in the interval. Electronically Signed   By: Dorise Bullion III M.D   On: 07/18/2020 09:49   ECHOCARDIOGRAM COMPLETE  Result Date: 07/18/2020    ECHOCARDIOGRAM REPORT   Patient Name:   NORIS KULINSKI Date of Exam: 07/18/2020 Medical Rec #:  [id number]      Height:       74.0 in Accession #:    [id number]     Weight:       202.6 lb Date of Birth:  01-03-1951     BSA:          2.185 m Patient Age:    29 years       BP:           145/57 mmHg Patient Gender: M              HR:            111 bpm. Exam Location:  Inpatient Procedure: 2D Echo, Color Doppler, Cardiac Doppler, Intracardiac Opacification            Agent and 3D Echo Indications:    Acute Respiratory Insufficiency R06.89  History:        Patient has prior history of Echocardiogram examinations, most                 recent 03/13/2020. COPD; Risk Factors:Hypertension, Diabetes and                 Dyslipidemia.  Sonographer:    Raquel Sarna Senior RDCS Referring Phys: [place]  Sonographer Comments: Scanned supine on artificial respirator. COVID Pending at time of study. IMPRESSIONS  1. Left ventricular ejection fraction, by estimation, is approximately 35%. The left ventricle has moderately decreased function. The left ventricle demonstrates regional wall motion abnormalities (see scoring diagram/findings for description). Left ventricular diastolic parameters are consistent with Grade I diastolic dysfunction (impaired relaxation).  2. Right ventricular systolic function is normal. The right ventricular size is normal. Tricuspid regurgitation signal is inadequate for assessing PA pressure.  3. The mitral valve is grossly normal, mildly thickened and calcified. Mild mitral valve regurgitation.  4. The aortic valve is probably tricuspid based on limited views with moderate calcification. Aortic valve regurgitation is mild. Moderate to severe aortic valve stenosis. Aortic valve mean gradient measures 37.0 mmHg. Aortic valve Vmax measures 4.07 m/s.  5. Aortic dilatation noted. There is mild dilatation of the ascending aorta.  6. The inferior vena cava is normal in size with <50% respiratory variability, suggesting right atrial pressure of 8 mmHg. FINDINGS  Left Ventricle: Left ventricular ejection fraction, by estimation, is 35%. The left ventricle has moderately decreased function. The left ventricle demonstrates regional wall motion abnormalities. Definity contrast agent was given IV to delineate the left ventricular endocardial  borders. The left ventricular internal cavity size was normal in size. There is borderline left ventricular hypertrophy. Left ventricular diastolic parameters are consistent with Grade I diastolic dysfunction (impaired relaxation).  LV Wall Scoring: The mid and distal anterior septum, mid and distal inferior wall, mid inferolateral segment, mid anterolateral segment, and mid inferoseptal segment are hypokinetic. The entire anterior wall, basal anteroseptal segment, basal inferolateral segment, apical lateral segment, basal anterolateral segment, basal inferior segment, basal inferoseptal segment, and apex are normal. Right Ventricle: The right ventricular size is  normal. No increase in right ventricular wall thickness. Right ventricular systolic function is normal. Tricuspid regurgitation signal is inadequate for assessing PA pressure. Left Atrium: Left atrial size was normal in size. Right Atrium: Right atrial size was normal in size. Pericardium: There is no evidence of pericardial effusion. Mitral Valve: The mitral valve is grossly normal. There is mild thickening of the mitral valve leaflet(s). There is mild calcification of the mitral valve leaflet(s). Mild mitral valve regurgitation. Tricuspid Valve: The tricuspid valve is grossly normal. Tricuspid valve regurgitation is trivial. Aortic Valve: The aortic valve is tricuspid. Aortic valve regurgitation is mild. Aortic regurgitation PHT measures 302 msec. Moderate to severe aortic stenosis is present. There is moderate calcification of the aortic valve. Aortic valve mean gradient measures 37.0 mmHg. Aortic valve peak gradient measures 66.3 mmHg. Aortic valve area, by VTI measures 1.05 cm. Pulmonic Valve: The pulmonic valve was not well visualized. Pulmonic valve regurgitation is trivial. Aorta: Aortic dilatation noted. There is mild dilatation of the ascending aorta. Venous: The inferior vena cava is normal in size with less than 50% respiratory variability,  suggesting right atrial pressure of 8 mmHg. IAS/Shunts: No atrial level shunt detected by color flow Doppler. Additional Comments: There is pleural effusion in the left lateral region.  LEFT VENTRICLE PLAX 2D LVIDd:         5.00 cm LVIDs:         3.80 cm LV PW:         0.90 cm LV IVS:        1.10 cm LVOT diam:     2.30 cm      3D Volume EF: LV SV:         82           3D EF:        48 % LV SV Index:   38           LV EDV:       191 ml LVOT Area:     4.15 cm     LV ESV:       99 ml                             LV SV:        92 ml  LV Volumes (MOD) LV vol d, MOD A2C: 135.0 ml LV vol d, MOD A4C: 146.0 ml LV vol s, MOD A2C: 91.6 ml LV vol s, MOD A4C: 94.8 ml LV SV MOD A2C:     43.4 ml LV SV MOD A4C:     146.0 ml LV SV MOD BP:      48.9 ml RIGHT VENTRICLE RV S prime:     16.80 cm/s TAPSE (M-mode): 2.7 cm LEFT ATRIUM             Index       RIGHT ATRIUM           Index LA diam:        4.00 cm 1.83 cm/m  RA Area:     12.20 cm LA Vol (A2C):   81.5 ml 37.29 ml/m RA Volume:   21.40 ml  9.79 ml/m LA Vol (A4C):   56.1 ml 25.67 ml/m LA Biplane Vol: 70.8 ml 32.40 ml/m  AORTIC VALVE AV Area (Vmax):    1.10 cm AV Area (Vmean):   1.10 cm AV Area (VTI):     1.05 cm AV Vmax:  407.00 cm/s AV Vmean:          280.000 cm/s AV VTI:            0.785 m AV Peak Grad:      66.3 mmHg AV Mean Grad:      37.0 mmHg LVOT Vmax:         107.60 cm/s LVOT Vmean:        74.200 cm/s LVOT VTI:          0.198 m LVOT/AV VTI ratio: 0.25 AI PHT:            302 msec  AORTA Ao Root diam: 3.50 cm Ao Asc diam:  4.10 cm  SHUNTS Systemic VTI:  0.20 m Systemic Diam: 2.30 cm Rozann Lesches MD Electronically signed by Rozann Lesches MD Signature Date/Time: 07/18/2020/3:35:36 PM    Final     Medications / Allergies: per chart  Antibiotics: Anti-infectives (From admission, onward)   Start     Dose/Rate Route Frequency Ordered Stop   07/17/20 1600  cefTRIAXone (ROCEPHIN) 2 g in sodium chloride 0.9 % 100 mL IVPB        2 g 200 mL/hr over 30  Minutes Intravenous Every 24 hours 07/17/20 1528     07/17/20 1400  vancomycin (VANCOCIN) IVPB 1000 mg/200 mL premix        1,000 mg 200 mL/hr over 60 Minutes Intravenous Every 8 hours 07/17/20 0944     07/02/2020 1600  ceFEPIme (MAXIPIME) 2 g in sodium chloride 0.9 % 100 mL IVPB  Status:  Discontinued        2 g 200 mL/hr over 30 Minutes Intravenous Every 8 hours 06/26/2020 1537 07/17/20 1528   06/22/2020 1500  clindamycin (CLEOCIN) IVPB 600 mg        600 mg 100 mL/hr over 30 Minutes Intravenous Every 6 hours 07/02/2020 1337 07/18/20 0553   07/15/2020 0600  vancomycin (VANCOREADY) IVPB 750 mg/150 mL  Status:  Discontinued        750 mg 150 mL/hr over 60 Minutes Intravenous Every 8 hours 07/07/2020 2310 07/17/20 0944   07/03/2020 0200  metroNIDAZOLE (FLAGYL) IVPB 500 mg  Status:  Discontinued        500 mg 100 mL/hr over 60 Minutes Intravenous Every 8 hours 07/01/2020 2220 07/13/20 1337   07/04/2020 2315  ceFEPIme (MAXIPIME) 2 g in sodium chloride 0.9 % 100 mL IVPB  Status:  Discontinued        2 g 200 mL/hr over 30 Minutes Intravenous Every 8 hours 07/07/2020 2310 06/30/2020 1537   07/18/2020 2000  vancomycin (VANCOREADY) IVPB 1500 mg/300 mL        1,500 mg 150 mL/hr over 120 Minutes Intravenous  Once 07/02/2020 1834 06/27/2020 2326   07/11/2020 1800  vancomycin (VANCOCIN) IVPB 1000 mg/200 mL premix  Status:  Discontinued        1,000 mg 200 mL/hr over 60 Minutes Intravenous  Once 06/25/2020 1757 06/30/2020 1834   07/06/2020 1800  clindamycin (CLEOCIN) IVPB 600 mg        600 mg 100 mL/hr over 30 Minutes Intravenous  Once 07/21/2020 1759 06/24/2020 1913   07/09/2020 1800  ciprofloxacin (CIPRO) IVPB 400 mg        400 mg 200 mL/hr over 60 Minutes Intravenous  Once 07/08/2020 1759 07/16/2020 2039        Note: Portions of this report may have been transcribed using voice recognition software. Every effort was made to ensure accuracy; however, inadvertent computerized transcription  errors may be present.   Any transcriptional  errors that result from this process are unintentional.    Adin Hector, MD, FACS, MASCRS Gastrointestinal and Minimally Invasive Surgery  [facility] [address], [place], [place] [address] [phone] Fax [phone] Main/Paging  CONTACT INFORMATION: Weekday (9AM-5PM) concerns: Call [facility] main office at [phone] Weeknight (5PM-9AM) or Weekend/Holiday concerns: Check [email or web address] for General Surgery [facility] coverage (Please, do not use SecureChat as it is not reliable communication to operating surgeons for immediate patient care)      07/19/2020  7:55 AM

## 2020-07-19 NOTE — Progress Notes (Signed)
NAME:  Omar Todd, MRN:  [id number], DOB:  12-01-1950, LOS: 7 ADMISSION DATE:  07/09/2020, CONSULTATION DATE:  07/14/20 REFERRING MD:  Omar Todd trnasfer, CHIEF COMPLAINT:  Dr Omar Todd at [facility] History    30 pack current smoker with DM, COPD Nos, BP, lipid, GERD. 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 At some pont also has stage 2 sacral decub "Size of a quarter"- described at time of this discharge. Discharged to ? Rehab and then signed out ? AMA from rehab.   Readimmited to [facility] 06/26/2020 with sepsis syndrome(fever + SIRS with lactate 2.1) with growing. Rt buttock ulcer noted to be stage 3 large wth fecal soiling but R AKA stump incision well healed but  By 07/05/2020 purulent drainage noted. CT pelvis c/w necrotizing fasciitis (with sub cut gas) in right perineal soft tissue and bedside exam was concerning for crepitus and concern for Fournier gangrene. Patient noticed to be disoriented by 06/28/2020  On 07/14/2020 Omar Todd excisional debridement of right buttock, right inguinal region, and right scrotal skin measuring (right buttock / right inguinal wound 20cmX10cmX4cm and right scrotal skin 15cmX8cmX2cm); Drainage and packing of Right AKA wound infection  -. 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 Concern for need for repeated debridement and urology involvement and patient transferred to [facility] from [facility] on 07/14/20. Upon arrival getting PRBC for hgb < 7gm% and sedatedh diprivan gtt and fent gtt on ventilator needing neo for bp support.    Decision maker:  he has a wife at [facility] with dementia and estranged children he has not seen since they were babies.Omar Todd [phone]  She is his closest  relative and decision maker. She does not have health care power of attorney.    Past Medical History     has a past medical history of COPD (chronic obstructive pulmonary disease) ([place]), Diabetes mellitus without complication ([place]), Gangrene of toe of right foot ([place]), GERD (gastroesophageal reflux disease), Hyperlipidemia, and Hypertension.   reports that he has been smoking cigarettes. He has a 30.00 pack-year smoking history. He has never used smokeless tobacco.   has a past surgical history that includes Thumb surgery; Esophagogastroduodenoscopy (N/A, 03/13/2020); biopsy (03/13/2020); Flexible sigmoidoscopy (N/A, 05/06/2020); Amputation (Right, 05/17/2020); Application if wound vac (Right, 05/17/2020); Amputation (Right, 06/19/2020); Incision and drainage of wound (Right, 07/18/2020); Incision and drainage (Right, 06/23/2020); Incision and drainage abscess (N/A, 07/18/2020); and Wound debridement (N/A, 07/16/2020).   Significant Hospital Events   06/26/2020 - [address] admit 07/14/20  - transfer to [facility] 8/25 - s. I and D and debridement of Rt Gluteal Nec Fasc by CCS and ->   Inspection of wound, debridement of right testicle/cord, scrotal wound edge ( excised tissue approximately 2 x 4 cm) by Urology. On neo gtt, diprivan gtt, fent gtt . On vent 40% . Not on TF (yet). Ortho Dr Omar Todd recmmended -> packing the  R AKA wound daily . Seen by Dr Omar Todd of Urology - currently R testicle exposed but viable tissue  8/26 = PRBC < 7 and 1 u prbc ordered. On vent. BAck to OR today, . Exposure to sister with covid + . Sister Omar Todd the [place] reported that  she is covid positive 8/.25/2 but she has not been at bedside. Later the visitor - sster. Omar Todd is also covid positive. APteitn repeat covid - negative  8/27 - Followed commands on WUA 8/27 am Remains on fent gtt at 125 mcg, diprivan gtt at 30 mcg and neo gtt at 75 mcg via PICC line.  T max 101 WBC 16 HGB 7.6 Platelets 170,000 Lactate 1.3 Potassium 3.9 Mag  2 + 16 L since admission    8/28 -  +18L since admit. Looking more voume overloaded. Increased fio2 50% on vent. CXR wet. Unable to wean. On fent gtt, diprivan gtt, neo gtt and LR at 100cc/h   Consults:  8/24 -   Procedures:  8/23 -  On 07/15/2020 Omar Todd excisional debridement of right buttock, right inguinal region, and right scrotal skin measuring (right buttock / right inguinal wound 20cmX10cmX4cm and right scrotal skin 15cmX8cmX2cm); Drainage and packing of Right AKA wound infection ETT 823  RUE PIcc 8/24  Significant Diagnostic Tests:  x  Micro Data:   Microbiology results: 8/22 BCx:  8/22 UCx:   8/22 Wound cx 8/23 - wound - multiple org. None predominant 8/23 wound inguinal -  KLEBSIELLA OXYTOCA     Organism ID, Bacteria STREPTOCOCCUS CONSTELLATUS    xxx 8/26 covid pcr - neg 8/31 - repeat covid pcr NEEDED   Antimicrobials:  Antimicrobials this admission: Cipro IV 8/22 >>8/22 Clinda 8/22 >8/22, 8/23 > 8/28 Vanc 8/22 >8/22, 8/22 >8/27, 8/27 > Cfeepime 8/22 >8/26, 8/26 > 8/27 Ceftriaxone 8/27 >> xxxx Clinda 8/30 preop Gentamicon 8/30 preop Flagyl 3 dose 8/30 preop Neomycin oral 8/29 3 doses preop xxxxx   Interim history/subjective:   8/29 - stil volume overloaded at +19.9L since admit. 40% fio2, Pn fent gtt. Off diprivan and neo. On TF.  CCS plans for 8/30 -> diverting loop colostomy for fecal diversion Todd massive soft tissue infection and necrosis limited mobility with amputation   Objective   Blood pressure (!) 107/46, pulse 72, temperature 100.2 F (37.9 C), temperature source Oral, resp. rate 18, height 6\' 2"  (1.88 m), weight 91.9 kg, SpO2 100 %.    Vent Mode: PRVC FiO2 (%):  [40 %-100 %] 40 % Set Rate:  [15 bmp] 15 bmp Vt Set:  [640 mL-650 mL] 650 mL PEEP:  [5 cmH20-8 cmH20] 8 cmH20 Plateau Pressure:  [18 cmH20] 18 cmH20   Intake/Output Summary (Last 24 hours) at 07/19/2020 1138 Last data filed at 07/19/2020 1100 Gross per 24 hour  Intake  2620.48 ml  Output 1675 ml  Net 945.48 ml   Filed Weights   07/14/20 1300 07/04/2020 0403 07/18/20 0500  Weight: 80.7 kg 84.5 kg 91.9 kg     General Appearance:  Looks criticall ill. Looks puffy Head:  Normocephalic, without obvious abnormality, atraumatic Eyes:  PERRL - yes, conjunctiva/corneas - muddy     Ears:  Normal external ear canals, both ears Nose:  G tube - no Throat:  ETT TUBE - yes , OG tube - yes Neck:  Supple,  No enlargement/tenderness/nodules Lungs: Clear to auscultation bilaterally, Ventilator   Synchrony - yes 40% Heart:  S1 and S2 normal, no murmur, CVP - x.  Pressors - yes Abdomen:  Soft, no masses, no organomegaly Genitalia / Rectal:  Dressing in innguinal area Extremities:  Extremities- Rt AKA - dressing Skin:  ntact in exposed areas . Sacral area - not examined Neurologic:  Sedation - fent gtt, diprivan gtt -> RASS - -4  No change from  8/28   Resolved Hospital Problem list   Circulatory shock 8/28  Assessment & Plan:  ASSESSMENT / PLAN:   A:  Baseline COPD NOS Acute post op respiratory failure in setting of infected sacral ulcer and Fournier gangrene 07/20/2020.    07/19/2020 - > does not meet criteria for SBT/Extubation in setting of Acute Respiratory Failure due to volume overload, surgical needs and sedatin   P:   PRVC VAP bundle Nebs  A:   Acute encephalopathy in setting of sepsis 07/19/2020 and pre-op. Left sedated post op on ventilator  07/19/2020 - WUA done 8/27 am - Followed commands and MAE. On 8/29 RASS -4 on fent gtt. Off diprivan gtt  P:   RASS goal 0 to -2 Fent gtt Start versed prn Daily WUA    A:   Circulatory shock likely due to sepsis and dioprivan  07/19/2020 - Off neo   P:  MAP goal > 65  Dc neo off MAR   A: Moderate AS in April 2021 with EF 55% and LVH ECHO 8/28 - drop in EF 35%, Aortic stenosis now moderate- severe. + Gr1 diast dysfn  8/29 - appears to have new osnest systolic CHF and ? Worsening valve  stenoss. Hypertension needing meds +  P: Clinically monitor Cotninue lasix for now due to volume overload 19L bu need to be careful due to valve issues Labetalol prn Check trop Might need cards consult    A:   SEvere sepsis from R Fournier gangrene, RT ischial ulcer and Rt AKA re-infection In hospital covid expsoure from family member 07/09/2020  07/19/2020 - Remains febrile and fever rising  P:   Broad abx as ordered Retest covid pcr 07/19/20 from trach aspirate -> if negative might need a repeat 07/21/20 Trach aspirate Culture 8/29 Check PRocal Check lacate   A:  At risk for AKI Volume overload  07/19/2020 - Creat normal yesterday. Pending today.  +19L Volume ooverload since admit. Did not respond to 40mg  tid alsix  P:  Lasix to contnue but double dose to 80mg  tid Avoid nephrotixins Maintain BP/HR Maintain renal perfusion   A:  Electrolyte imbalance  07/19/2020 - mag and phos repleted yesterday  P: Monitor K goal > 4 Mag goal > 2 Phos goal - normal    A:   AT risk stress ulcer Shock liver  07/19/2020 - RUQ US>> Increased echogenicity liver compatible with fatty infiltration. No liver mass. Mild gallbladder sludge.  No biliary dilatation.. Improved LFT 8/28  P:   Protoonix Trend LFT's   A:  Anemia of critical illness and chronic disease - s/p PRBC 07/14/20 and 06/29/2020  07/19/2020 - no overt bleeding Still > 7gm%   P:  -Monitor for bleeding -Transfuse for HGB per noted below - PRBC for hgb </= 6.9gm%    - exceptions are   -  if ACS susepcted/confirmed then transfuse for hgb </= 8.0gm%,  or    -  active bleeding with hemodynamic instability, then transfuse regardless of hemoglobin value   At at all times try to transfuse 1 unit prbc as possible with exception of active hemorrhage    A:   At risk for hypo and hyperglycemia    P:   icu hypergluycemiua protocol CBG Q4 SSI  MSK/DERM Rt AKA wound infection - on dressing and chaing RT sacral  decub - large with nec fasc Rt Fournier gangrene   07/19/2020 s/p ID 07/21/2020 and  07/18/2020. New one reported left shoulder - DTI.  Planned  colostomy diversion 8/29  Plan  - ortho, ccs and urology appreciate assistance   Best practice:  Diet: TF  Pain/Anxiety/Delirium protocol (if indicated): sedation protocol VAP protocol (if indicated): Bundle + DVT prophylaxis: Lovenox/Heparin GI prophylaxis: ppi Glucose control: ssi Mobility: bed rest Code Status: full code  Family Communication: Sister Houston Zapien main decision makder  [phone]   updated 07/18/20  Over phone. - she had questions about why being asked to set up password as new process. Offered to refer to office of patient experience. However, she wanted bedside RN to give her  A call  8/29 - updated sister Laurice Kimmons over phone   Disposition: [facility]      ATTESTATION & SIGNATURE   The patient Omar Todd is critically ill with multiple organ systems failure and requires high complexity decision making for assessment and support, frequent evaluation and titration of therapies, application of advanced monitoring technologies and extensive interpretation of multiple databases.   Critical Care Time devoted to patient care services described in this note is  40  Minutes. This time reflects time of care of this signee Dr Brand Males. This critical care time does not reflect procedure time, or teaching time or supervisory time of PA/NP/Med student/Med Resident etc but could involve care discussion time     Dr. Brand Males, M.D., [facility].C.P Pulmonary and Critical Care Medicine Staff Physician [place] [facility] Pager: [phone], If no answer or between  15:00h - 7:00h: call [phone]  07/19/2020 12:10 PM    LABS    PULMONARY Recent Labs  Lab 06/25/2020 1850 07/14/20 1413  PHART 7.463* 7.392  PCO2ART 35.8 39.3  PO2ART 103 96.8  HCO3 26.0 23.4    O2SAT 98.2 97.6    CBC Recent Labs  Lab 07/17/20 0457 07/18/20 0352 07/19/20 0628  HGB 7.6* 7.1* 7.8*  HCT 25.2* 24.1* 26.8*  WBC 16.0* 18.1* 23.7*  PLT 170 166 186    COAGULATION Recent Labs  Lab 06/28/2020 1800 07/19/20 0628  INR 1.3* 1.2    CARDIAC  No results for input(s): TROPONINI in the last 168 hours. No results for input(s): PROBNP in the last 168 hours.   CHEMISTRY Recent Labs  Lab 07/11/2020 1940 07/03/2020 0551 07/14/20 1730 07/14/20 1730 06/21/2020 0355 06/21/2020 0355 07/16/20 0425 07/16/20 0425 07/17/20 0457 07/18/20 0352  Todd  --    < > 139  --  139  --  139  --  141 139  K  --    < > 3.2*   < > 4.5   < > 3.8   < > 3.9 4.0  CL  --    < > 111  --  110  --  110  --  111 110  CO2  --    < > 22  --  21*  --  23  --  24 24  GLUCOSE  --    < > 96  --  114*  --  214*  --  198* 217*  BUN  --    < > 23  --  25*  --  27*  --  26* 25*  CREATININE  --    < > 0.53*  --  0.63  --  0.62  --  0.55* 0.46*  CALCIUM  --    < > 6.6*  --  7.2*  --  7.2*  --  7.2* 7.2*  MG 1.7  --   --   --   --   --  1.6*  --  2.0 1.9  PHOS  --   --   --   --   --   --  1.5*  --  2.0* 2.3*   < > = values in this interval not displayed.   Estimated Creatinine Clearance: 101.3 mL/min (A) (by C-G formula based on SCr of 0.46 mg/dL (L)).   LIVER Recent Labs  Lab 07/18/2020 1800 07/14/20 0817 07/14/20 1730 07/21/2020 0355 06/24/2020 0425 07/17/20 0457 07/18/20 0352 07/19/20 0628  AST 28   < > 660* 409* 182* 149* 56*  --   ALT 24   < > 359* 324* 197* 172* 106*  --   ALKPHOS 117   < > 85 96 118 139* 169*  --   BILITOT 0.9   < > 0.6 0.5 0.7 0.6 0.3  --   PROT 5.7*   < > 3.9* 4.4* 3.8* 4.1* 4.1*  --   ALBUMIN 2.1*   < > 1.3* 1.4* 1.2* 1.1* 1.0*  --   INR 1.3*  --   --   --   --   --   --  1.2   < > = values in this interval not displayed.     INFECTIOUS Recent Labs  Lab 07/10/2020 2005 06/24/2020 2209 07/17/20 0457  LATICACIDVEN 2.1* 1.7 1.3     ENDOCRINE CBG (last 3)  Recent  Labs    07/18/20 2302 07/19/20 0549 07/19/20 0855  GLUCAP 269* 301* 265*         IMAGING x48h  - image(s) personally visualized  -   highlighted in bold DG CHEST PORT 1 VIEW  Result Date: 07/19/2020 CLINICAL DATA:  ETT EXAM: PORTABLE CHEST 1 VIEW COMPARISON:  July 18, 2020 FINDINGS: The ETT is in good position. A right PICC line is poorly evaluated due to patient positioning and poor penetration. The NG tube terminates below today's film. Diffuse interstitial opacities are stable to mildly more prominent. More focal opacity in left retrocardiac region identified. Probable layering effusion on the left. No pneumothorax. IMPRESSION: 1. Support apparatus as above. 2. Interstitial opacities in the lungs likely represent pulmonary edema, mildly more prominent the interval. 3. Opacity in left retrocardiac region may represent atelectasis Todd the apparent layering effusions seen on today's study and the previous. Electronically Signed   By: Dorise Bullion III M.D   On: 07/19/2020 08:20   DG CHEST PORT 1 VIEW  Result Date: 07/18/2020 CLINICAL DATA:  Respiratory failure EXAM: PORTABLE CHEST 1 VIEW COMPARISON:  July 17, 2020 FINDINGS: The ETT and right PICC line are stable. The NG tube terminates below today's film. No pneumothorax. Bilateral layering effusions with associated underlying opacities are similar in the interval. The infiltrate may be mildly worse in the right base in the interval. No pneumothorax. No change in the cardiomediastinal silhouette with persistent cardiomegaly. IMPRESSION: 1. Support apparatus as above. 2. Bibasilar infiltrates/edema with probable associated effusions are similar in the interval. The infiltrate may be slightly worsened on the right in the interval. Electronically Signed   By: Dorise Bullion III M.D   On: 07/18/2020 09:49   ECHOCARDIOGRAM COMPLETE  Result Date: 07/18/2020    ECHOCARDIOGRAM REPORT   Patient Name:   DARELLE KINGS Date of Exam: 07/18/2020  Medical Rec #:  [id number]      Height:       74.0 in Accession #:    [id number]     Weight:       202.6 lb Date of  Birth:  19-Dec-1950     BSA:          2.185 m Patient Age:    32 years       BP:           145/57 mmHg Patient Gender: M              HR:           111 bpm. Exam Location:  Inpatient Procedure: 2D Echo, Color Doppler, Cardiac Doppler, Intracardiac Opacification            Agent and 3D Echo Indications:    Acute Respiratory Insufficiency R06.89  History:        Patient has prior history of Echocardiogram examinations, most                 recent 03/13/2020. COPD; Risk Factors:Hypertension, Diabetes and                 Dyslipidemia.  Sonographer:    Raquel Sarna Senior RDCS Referring Phys: [place]  Sonographer Comments: Scanned supine on artificial respirator. COVID Pending at time of study. IMPRESSIONS  1. Left ventricular ejection fraction, by estimation, is approximately 35%. The left ventricle has moderately decreased function. The left ventricle demonstrates regional wall motion abnormalities (see scoring diagram/findings for description). Left ventricular diastolic parameters are consistent with Grade I diastolic dysfunction (impaired relaxation).  2. Right ventricular systolic function is normal. The right ventricular size is normal. Tricuspid regurgitation signal is inadequate for assessing PA pressure.  3. The mitral valve is grossly normal, mildly thickened and calcified. Mild mitral valve regurgitation.  4. The aortic valve is probably tricuspid based on limited views with moderate calcification. Aortic valve regurgitation is mild. Moderate to severe aortic valve stenosis. Aortic valve mean gradient measures 37.0 mmHg. Aortic valve Vmax measures 4.07 m/s.  5. Aortic dilatation noted. There is mild dilatation of the ascending aorta.  6. The inferior vena cava is normal in size with <50% respiratory variability, suggesting right atrial pressure of 8 mmHg. FINDINGS  Left Ventricle: Left  ventricular ejection fraction, by estimation, is 35%. The left ventricle has moderately decreased function. The left ventricle demonstrates regional wall motion abnormalities. Definity contrast agent was Todd IV to delineate the left ventricular endocardial borders. The left ventricular internal cavity size was normal in size. There is borderline left ventricular hypertrophy. Left ventricular diastolic parameters are consistent with Grade I diastolic dysfunction (impaired relaxation).  LV Wall Scoring: The mid and distal anterior septum, mid and distal inferior wall, mid inferolateral segment, mid anterolateral segment, and mid inferoseptal segment are hypokinetic. The entire anterior wall, basal anteroseptal segment, basal inferolateral segment, apical lateral segment, basal anterolateral segment, basal inferior segment, basal inferoseptal segment, and apex are normal. Right Ventricle: The right ventricular size is normal. No increase in right ventricular wall thickness. Right ventricular systolic function is normal. Tricuspid regurgitation signal is inadequate for assessing PA pressure. Left Atrium: Left atrial size was normal in size. Right Atrium: Right atrial size was normal in size. Pericardium: There is no evidence of pericardial effusion. Mitral Valve: The mitral valve is grossly normal. There is mild thickening of the mitral valve leaflet(s). There is mild calcification of the mitral valve leaflet(s). Mild mitral valve regurgitation. Tricuspid Valve: The tricuspid valve is grossly normal. Tricuspid valve regurgitation is trivial. Aortic Valve: The aortic valve is tricuspid. Aortic valve regurgitation is mild. Aortic regurgitation PHT measures 302 msec. Moderate to severe aortic stenosis is present. There is  moderate calcification of the aortic valve. Aortic valve mean gradient measures 37.0 mmHg. Aortic valve peak gradient measures 66.3 mmHg. Aortic valve area, by VTI measures 1.05 cm. Pulmonic Valve: The  pulmonic valve was not well visualized. Pulmonic valve regurgitation is trivial. Aorta: Aortic dilatation noted. There is mild dilatation of the ascending aorta. Venous: The inferior vena cava is normal in size with less than 50% respiratory variability, suggesting right atrial pressure of 8 mmHg. IAS/Shunts: No atrial level shunt detected by color flow Doppler. Additional Comments: There is pleural effusion in the left lateral region.  LEFT VENTRICLE PLAX 2D LVIDd:         5.00 cm LVIDs:         3.80 cm LV PW:         0.90 cm LV IVS:        1.10 cm LVOT diam:     2.30 cm      3D Volume EF: LV SV:         82           3D EF:        48 % LV SV Index:   38           LV EDV:       191 ml LVOT Area:     4.15 cm     LV ESV:       99 ml                             LV SV:        92 ml  LV Volumes (MOD) LV vol d, MOD A2C: 135.0 ml LV vol d, MOD A4C: 146.0 ml LV vol s, MOD A2C: 91.6 ml LV vol s, MOD A4C: 94.8 ml LV SV MOD A2C:     43.4 ml LV SV MOD A4C:     146.0 ml LV SV MOD BP:      48.9 ml RIGHT VENTRICLE RV S prime:     16.80 cm/s TAPSE (M-mode): 2.7 cm LEFT ATRIUM             Index       RIGHT ATRIUM           Index LA diam:        4.00 cm 1.83 cm/m  RA Area:     12.20 cm LA Vol (A2C):   81.5 ml 37.29 ml/m RA Volume:   21.40 ml  9.79 ml/m LA Vol (A4C):   56.1 ml 25.67 ml/m LA Biplane Vol: 70.8 ml 32.40 ml/m  AORTIC VALVE AV Area (Vmax):    1.10 cm AV Area (Vmean):   1.10 cm AV Area (VTI):     1.05 cm AV Vmax:           407.00 cm/s AV Vmean:          280.000 cm/s AV VTI:            0.785 m AV Peak Grad:      66.3 mmHg AV Mean Grad:      37.0 mmHg LVOT Vmax:         107.60 cm/s LVOT Vmean:        74.200 cm/s LVOT VTI:          0.198 m LVOT/AV VTI ratio: 0.25 AI PHT:            302 msec  AORTA Ao Root diam:  3.50 cm Ao Asc diam:  4.10 cm  SHUNTS Systemic VTI:  0.20 m Systemic Diam: 2.30 cm Rozann Lesches MD Electronically signed by Rozann Lesches MD Signature Date/Time: 07/18/2020/3:35:36 PM    Final       Anti-infectives (From admission, onward)   Start     Dose/Rate Route Frequency Ordered Stop   07/04/2020 1000  clindamycin (CLEOCIN) IVPB 900 mg       "And" Linked Group Details   900 mg 100 mL/hr over 30 Minutes Intravenous 60 min pre-op 07/19/20 0854     06/23/2020 1000  gentamicin (GARAMYCIN) 460 mg in dextrose 5 % 100 mL IVPB       "And" Linked Group Details   5 mg/kg  91.9 kg 111.5 mL/hr over 60 Minutes Intravenous 60 min pre-op 07/19/20 0854     07/19/20 1400  neomycin (MYCIFRADIN) tablet 1,000 mg       "And" Linked Group Details   1,000 mg Oral 3 times per day 07/19/20 0854 06/23/2020 1359   07/19/20 1400  metroNIDAZOLE (FLAGYL) tablet 1,000 mg       "And" Linked Group Details   1,000 mg Oral 3 times per day 07/19/20 0854 07/16/2020 1359   07/17/20 1600  cefTRIAXone (ROCEPHIN) 2 g in sodium chloride 0.9 % 100 mL IVPB        2 g 200 mL/hr over 30 Minutes Intravenous Every 24 hours 07/17/20 1528     07/17/20 1400  vancomycin (VANCOCIN) IVPB 1000 mg/200 mL premix        1,000 mg 200 mL/hr over 60 Minutes Intravenous Every 8 hours 07/17/20 0944     06/27/2020 1600  ceFEPIme (MAXIPIME) 2 g in sodium chloride 0.9 % 100 mL IVPB  Status:  Discontinued        2 g 200 mL/hr over 30 Minutes Intravenous Every 8 hours 07/13/2020 1537 07/17/20 1528   07/03/2020 1500  clindamycin (CLEOCIN) IVPB 600 mg        600 mg 100 mL/hr over 30 Minutes Intravenous Every 6 hours 06/22/2020 1337 07/18/20 0553   06/24/2020 0600  vancomycin (VANCOREADY) IVPB 750 mg/150 mL  Status:  Discontinued        750 mg 150 mL/hr over 60 Minutes Intravenous Every 8 hours 06/27/2020 2310 07/17/20 0944   06/28/2020 0200  metroNIDAZOLE (FLAGYL) IVPB 500 mg  Status:  Discontinued        500 mg 100 mL/hr over 60 Minutes Intravenous Every 8 hours 07/09/2020 2220 07/03/2020 1337   07/02/2020 2315  ceFEPIme (MAXIPIME) 2 g in sodium chloride 0.9 % 100 mL IVPB  Status:  Discontinued        2 g 200 mL/hr over 30 Minutes Intravenous Every 8 hours  07/13/2020 2310 07/08/2020 1537   07/11/2020 2000  vancomycin (VANCOREADY) IVPB 1500 mg/300 mL        1,500 mg 150 mL/hr over 120 Minutes Intravenous  Once 07/19/2020 1834 07/01/2020 2326   07/11/2020 1800  vancomycin (VANCOCIN) IVPB 1000 mg/200 mL premix  Status:  Discontinued        1,000 mg 200 mL/hr over 60 Minutes Intravenous  Once 07/02/2020 1757 07/05/2020 1834   07/14/2020 1800  clindamycin (CLEOCIN) IVPB 600 mg        600 mg 100 mL/hr over 30 Minutes Intravenous  Once 07/02/2020 1759 06/22/2020 1913   07/06/2020 1800  ciprofloxacin (CIPRO) IVPB 400 mg        400 mg 200 mL/hr over 60 Minutes Intravenous  Once 07/13/2020  1759 07/21/2020 2039        

## 2020-07-19 NOTE — Progress Notes (Signed)
Pharmacy Antibiotic Note  Omar Todd is a 69 y.o. male admitted on 07/15/2020 with sepsis, large sacral decubitus ulcer, necrotizing fasciitis.  Patient is s/p OR debridement 8/23 and second look and I&D 8/25, third look with I&D on 8/26.  Pharmacy has been consulted for Vancomycin dosing.  Cefepime/Clindamycin was narrowed to Ceftriaxone.  Day #7 abx - Tm 101.5 - WBC 23.7 - SCr 0.46 > increased to 0.95 - MD notes watery stools (recent clindamycin) - vancomycin trough elevated at 26  Plan: Continue Ceftriaxone 2g IV q24h per MD Decrease to Vancomycin 1000 mg IV Q12 hrs Check vanc trough at new steady state.  Goal 15-20 mcg/ml.  Will f/u renal function, micro data, and pt's clinical condition   Height: 6\' 2"  (188 cm) Weight: 91.9 kg (202 lb 9.6 oz) IBW/kg (Calculated) : 82.2  Temp (24hrs), Avg:100.4 F (38 C), Min:99.2 F (37.3 C), Max:101.5 F (38.6 C)  Recent Labs  Lab 07/17/2020 1802 06/29/2020 2005 06/26/2020 2209 07/14/2020 0551 07/14/20 0817 07/14/20 0817 07/14/20 1730 06/29/2020 0355 07/21/2020 0425 07/17/20 0457 07/18/20 0352  WBC  --   --   --    < > 22.9*  --   --  17.9* 15.0* 16.0* 18.1*  CREATININE  --   --   --    < > 0.61   < > 0.53* 0.63 0.62 0.55* 0.46*  LATICACIDVEN 1.4 2.1* 1.7  --   --   --   --   --   --  1.3  --   VANCOTROUGH  --   --   --   --   --   --   --   --   --  13*  --    < > = values in this interval not displayed.    Estimated Creatinine Clearance: 101.3 mL/min (A) (by C-G formula based on SCr of 0.46 mg/dL (L)).    Allergies  Allergen Reactions  . Penicillins Other (See Comments)    Pt. States he "passed out"    Antimicrobials this admission:  8/22 Cipro x 1 8/22 Clinda x 1, resume 8/23 >>8/28 8/22 Vancomycin >> 8/23 Metronidazole >> 8/23 8/23 Cefepime >> 8/27 8/27 Ceftriaxone >>   Dose adjustments this admission:  8/27 VT at 05:30 = 13 on 750mg  q8h --> increase to 1g q8h 8/29 VT at 05:30 = 26 on 1g IV q8h --> decrease to 1g IV  q12h  Microbiology results:  8/22 COVID: neg 8/22 BCx: NGF 8/22 UCx:  multiple species (F) 8/22 Wound from ulcer cxs: multiple organisms, no staph, no group A strep 8/23 Sacrum abscess: multiple organisms, no staph, no group A strep (F) 8/23 Ischial wound: Strep agalactiae 8/23 AKA stump: rare MRSA, rare GBS (F) 8/23 Inguinal wound: rare Klebsiella oxytoca (pan-sens except ampicillin), rare strep constellatus (sens: levo, vanc)  (F) 8/24 MRSA PCR: positive 8/25 Respiratory panel (RSV/Flu/COVID): all neg  Thank you for allowing pharmacy to be a part of this patient's care.  Gretta Arab PharmD, BCPS Clinical Pharmacist [facility] main pharmacy [phone] 07/19/2020 7:11 AM

## 2020-07-19 NOTE — Progress Notes (Signed)
3 Days Post-Op  Subjective: CC: Fornier's gangrene involving right scrotum.  Hx;  Omar Todd is POD#3 from a second debridement for Forniers gangrene of the right perineum, buttock and scrotal area.   He remains intubated in critical condition.    ROS:  Review of Systems  Unable to perform ROS: Intubated    Anti-infectives: Anti-infectives (From admission, onward)   Start     Dose/Rate Route Frequency Ordered Stop   07/17/20 1600  cefTRIAXone (ROCEPHIN) 2 g in sodium chloride 0.9 % 100 mL IVPB        2 g 200 mL/hr over 30 Minutes Intravenous Every 24 hours 07/17/20 1528     07/17/20 1400  vancomycin (VANCOCIN) IVPB 1000 mg/200 mL premix        1,000 mg 200 mL/hr over 60 Minutes Intravenous Every 8 hours 07/17/20 0944     06/29/2020 1600  ceFEPIme (MAXIPIME) 2 g in sodium chloride 0.9 % 100 mL IVPB  Status:  Discontinued        2 g 200 mL/hr over 30 Minutes Intravenous Every 8 hours 07/14/2020 1537 07/17/20 1528   07/07/2020 1500  clindamycin (CLEOCIN) IVPB 600 mg        600 mg 100 mL/hr over 30 Minutes Intravenous Every 6 hours 07/11/2020 1337 07/18/20 0553   07/11/2020 0600  vancomycin (VANCOREADY) IVPB 750 mg/150 mL  Status:  Discontinued        750 mg 150 mL/hr over 60 Minutes Intravenous Every 8 hours 07/06/2020 2310 07/17/20 0944   07/01/2020 0200  metroNIDAZOLE (FLAGYL) IVPB 500 mg  Status:  Discontinued        500 mg 100 mL/hr over 60 Minutes Intravenous Every 8 hours 07/21/2020 2220 07/14/2020 1337   07/21/2020 2315  ceFEPIme (MAXIPIME) 2 g in sodium chloride 0.9 % 100 mL IVPB  Status:  Discontinued        2 g 200 mL/hr over 30 Minutes Intravenous Every 8 hours 07/21/2020 2310 06/26/2020 1537   06/23/2020 2000  vancomycin (VANCOREADY) IVPB 1500 mg/300 mL        1,500 mg 150 mL/hr over 120 Minutes Intravenous  Once 06/21/2020 1834 07/08/2020 2326   06/25/2020 1800  vancomycin (VANCOCIN) IVPB 1000 mg/200 mL premix  Status:  Discontinued        1,000 mg 200 mL/hr over 60 Minutes Intravenous  Once  07/05/2020 1757 07/03/2020 1834   06/29/2020 1800  clindamycin (CLEOCIN) IVPB 600 mg        600 mg 100 mL/hr over 30 Minutes Intravenous  Once 07/11/2020 1759 07/17/2020 1913   06/28/2020 1800  ciprofloxacin (CIPRO) IVPB 400 mg        400 mg 200 mL/hr over 60 Minutes Intravenous  Once 06/25/2020 1759 07/11/2020 2039      Current Facility-Administered Medications  Medication Dose Route Frequency Provider Last Rate Last Admin  . acetaminophen (TYLENOL) tablet 650 mg  650 mg Per Tube Q6H PRN Frederik Pear, MD   650 mg at 07/18/20 2230  . ascorbic acid (VITAMIN C) tablet 500 mg  500 mg Per Tube BID Earnstine Regal, PA-C   500 mg at 07/18/20 2231  . cefTRIAXone (ROCEPHIN) 2 g in sodium chloride 0.9 % 100 mL IVPB  2 g Intravenous Q24H Norm Parcel, PA-C   Stopped at 07/18/20 1711  . chlorhexidine gluconate (MEDLINE KIT) (PERIDEX) 0.12 % solution 15 mL  15 mL Mouth Rinse BID Earnstine Regal, PA-C   15 mL at 07/18/20 2037  . Chlorhexidine Gluconate  Cloth 2 % PADS 6 each  6 each Topical Daily Earnstine Regal, PA-C   6 each at 07/18/20 [id number]  . docusate (COLACE) 50 MG/5ML liquid 100 mg  100 mg Per Tube BID Earnstine Regal, PA-C   100 mg at 07/18/20 2230  . feeding supplement (PROSource TF) liquid 90 mL  90 mL Per Tube 5 X Daily Earnstine Regal, PA-C   90 mL at 07/19/20 0601  . feeding supplement (VITAL AF 1.2 CAL) liquid 1,000 mL  1,000 mL Per Tube Continuous Earnstine Regal, PA-C 50 mL/hr at 07/19/20 0045 Restarted at 07/19/20 0045  . fentaNYL (SUBLIMAZE) bolus via infusion 25 mcg  25 mcg Intravenous Q15 min PRN Earnstine Regal, PA-C   25 mcg at 07/19/20 0045  . fentaNYL 2541mg in NS 2531m(1029mml) infusion-PREMIX  25-200 mcg/hr Intravenous Continuous JenEarnstine RegalA-C 20 mL/hr at 07/19/20 0300 200 mcg/hr at 07/19/20 0300  . ferrous sulfate 300 (60 Fe) MG/5ML syrup 300 mg  300 mg Per Tube Q breakfast JenEarnstine RegalA-C   300 mg at 07/18/20 0915  . furosemide (LASIX) injection 40 mg   40 mg Intravenous Q8H RamBrand MalesD   40 mg at 07/19/20 0601  . insulin aspart (novoLOG) injection 0-9 Units  0-9 Units Subcutaneous Q4H JenEarnstine RegalA-C   7 Units at 07/19/20 [id number] labetalol (NORMODYNE) injection 5-10 mg  5-10 mg Intravenous Q2H PRN OgaFrederik PearD   10 mg at 07/19/20 0553  . lactated ringers infusion   Intravenous Continuous RamBrand MalesD 10 mL/hr at 07/19/20 0300 Rate Verify at 07/19/20 0300  . MEDLINE mouth rinse  15 mL Mouth Rinse 10 times per day JenEarnstine RegalA-C   15 mL at 07/19/20 0530  . midazolam (VERSED) injection 1-4 mg  1-4 mg Intravenous Q2H PRN RamBrand MalesD   2 mg at 07/19/20 0601  . mupirocin ointment (BACTROBAN) 2 % 1 application  1 application Nasal BID JenEarnstine RegalA-C   1 application at 08/54/07/8131  . nutrition supplement (JUVEN) (JUVEN) powder packet 1 packet  1 packet Per Tube BID BM JenEarnstine RegalA-C   1 packet at 07/18/20 2035  . ondansetron (ZOFRAN) tablet 4 mg  4 mg Oral Q6H PRN JenEarnstine RegalA-C   4 mg at 07/18/20 2230   Or  . ondansetron (ZOFRAN) injection 4 mg  4 mg Intravenous Q6H PRN JenEarnstine RegalA-C      . pantoprazole (PROTONIX) injection 40 mg  40 mg Intravenous Daily JenEarnstine RegalA-C   40 mg at 07/18/20 0915  . phenylephrine CONCENTRATED 100m71m sodium chloride 0.9% 250mL85m4mg/m35minfusion  0-400 mcg/min Intravenous Titrated JenninEarnstine Regal   Stopped at 07/18/20 1233  . polyethylene glycol (MIRALAX / GLYCOLAX) packet 17 g  17 g Oral Daily PRN JenninEarnstine Regal      . polyethylene glycol (MIRALAX / GLYCOLAX) packet 17 g  17 g Oral Daily JenninEarnstine Regal   17 g at 07/18/20 0915  . potassium chloride 20 MEQ/15ML (10%) solution 40 mEq  40 mEq Per Tube BID RamaswBrand Males 40 mEq at 07/18/20 2231  . sodium chloride flush (NS) 0.9 % injection 10-40 mL  10-40 mL Intracatheter Q12H JenninEarnstine Regal   10 mL at 07/18/20 2233  . sodium  chloride flush (NS) 0.9 % injection 10-40 mL  10-40 mL Intracatheter PRN JenninEarnstine Regal      . thiamine tablet 100 mg  100 mg  Per Tube Daily Earnstine Regal, PA-C   100 mg at 07/18/20 0915  . vancomycin (VANCOCIN) IVPB 1000 mg/200 mL premix  1,000 mg Intravenous Q8H Shade, Haze Justin, RPH   Stopped at 07/18/20 2354  . zinc sulfate capsule 220 mg  220 mg Per Tube Daily Earnstine Regal, PA-C   220 mg at 07/18/20 1050     Objective: Vital signs in last 24 hours: Temp:  [99.2 F (37.3 C)-101.5 F (38.6 C)] 99.2 F (37.3 C) (08/29 0054) Pulse Rate:  [61-124] 88 (08/29 0300) Resp:  [14-28] 20 (08/29 0300) BP: (114-189)/(45-125) 114/51 (08/29 0300) SpO2:  [75 %-100 %] 97 % (08/29 0308) FiO2 (%):  [40 %-100 %] 50 % (08/29 0308)  Intake/Output from previous day: 08/28 0701 - 08/29 0700 In: 3203.3 [I.V.:1326.3; NG/GT:1195.8; IV Piggyback:681.1] Out: 1200 [Urine:1200] Intake/Output this shift: Total I/O In: 805 [I.V.:271.5; NG/GT:345.8; IV Piggyback:187.7] Out: 650 [Urine:650]   Physical Exam Genitourinary:    Comments: Anterior aspect of wound appears viable without residual or progressive necrosis.   Dressing not fully removed.     Lab Results:  Recent Labs    07/17/20 0457 07/18/20 0352  WBC 16.0* 18.1*  HGB 7.6* 7.1*  HCT 25.2* 24.1*  PLT 170 166   BMET Recent Labs    07/17/20 0457 07/18/20 0352  NA 141 139  K 3.9 4.0  CL 111 110  CO2 24 24  GLUCOSE 198* 217*  BUN 26* 25*  CREATININE 0.55* 0.46*  CALCIUM 7.2* 7.2*   PT/INR No results for input(s): LABPROT, INR in the last 72 hours. ABG No results for input(s): PHART, HCO3 in the last 72 hours.  Invalid input(s): PCO2, PO2  Studies/Results: DG CHEST PORT 1 VIEW  Result Date: 07/18/2020 CLINICAL DATA:  Respiratory failure EXAM: PORTABLE CHEST 1 VIEW COMPARISON:  July 17, 2020 FINDINGS: The ETT and right PICC line are stable. The NG tube terminates below today's film. No pneumothorax.  Bilateral layering effusions with associated underlying opacities are similar in the interval. The infiltrate may be mildly worse in the right base in the interval. No pneumothorax. No change in the cardiomediastinal silhouette with persistent cardiomegaly. IMPRESSION: 1. Support apparatus as above. 2. Bibasilar infiltrates/edema with probable associated effusions are similar in the interval. The infiltrate may be slightly worsened on the right in the interval. Electronically Signed   By: Dorise Bullion III M.D   On: 07/18/2020 09:49   ECHOCARDIOGRAM COMPLETE  Result Date: 07/18/2020    ECHOCARDIOGRAM REPORT   Patient Name:   Omar Todd Date of Exam: 07/18/2020 Medical Rec #:  [id number]      Height:       74.0 in Accession #:    [id number]     Weight:       202.6 lb Date of Birth:  09/23/1951     BSA:          2.185 m Patient Age:    69 years       BP:           145/57 mmHg Patient Gender: M              HR:           111 bpm. Exam Location:  Inpatient Procedure: 2D Echo, Color Doppler, Cardiac Doppler, Intracardiac Opacification            Agent and 3D Echo Indications:    Acute Respiratory Insufficiency R06.89  History:  Patient has prior history of Echocardiogram examinations, most                 recent 03/13/2020. COPD; Risk Factors:Hypertension, Diabetes and                 Dyslipidemia.  Sonographer:    Raquel Sarna Senior RDCS Referring Phys: [place]  Sonographer Comments: Scanned supine on artificial respirator. COVID Pending at time of study. IMPRESSIONS  1. Left ventricular ejection fraction, by estimation, is approximately 35%. The left ventricle has moderately decreased function. The left ventricle demonstrates regional wall motion abnormalities (see scoring diagram/findings for description). Left ventricular diastolic parameters are consistent with Grade I diastolic dysfunction (impaired relaxation).  2. Right ventricular systolic function is normal. The right ventricular size is  normal. Tricuspid regurgitation signal is inadequate for assessing PA pressure.  3. The mitral valve is grossly normal, mildly thickened and calcified. Mild mitral valve regurgitation.  4. The aortic valve is probably tricuspid based on limited views with moderate calcification. Aortic valve regurgitation is mild. Moderate to severe aortic valve stenosis. Aortic valve mean gradient measures 37.0 mmHg. Aortic valve Vmax measures 4.07 m/s.  5. Aortic dilatation noted. There is mild dilatation of the ascending aorta.  6. The inferior vena cava is normal in size with <50% respiratory variability, suggesting right atrial pressure of 8 mmHg. FINDINGS  Left Ventricle: Left ventricular ejection fraction, by estimation, is 35%. The left ventricle has moderately decreased function. The left ventricle demonstrates regional wall motion abnormalities. Definity contrast agent was given IV to delineate the left ventricular endocardial borders. The left ventricular internal cavity size was normal in size. There is borderline left ventricular hypertrophy. Left ventricular diastolic parameters are consistent with Grade I diastolic dysfunction (impaired relaxation).  LV Wall Scoring: The mid and distal anterior septum, mid and distal inferior wall, mid inferolateral segment, mid anterolateral segment, and mid inferoseptal segment are hypokinetic. The entire anterior wall, basal anteroseptal segment, basal inferolateral segment, apical lateral segment, basal anterolateral segment, basal inferior segment, basal inferoseptal segment, and apex are normal. Right Ventricle: The right ventricular size is normal. No increase in right ventricular wall thickness. Right ventricular systolic function is normal. Tricuspid regurgitation signal is inadequate for assessing PA pressure. Left Atrium: Left atrial size was normal in size. Right Atrium: Right atrial size was normal in size. Pericardium: There is no evidence of pericardial effusion. Mitral  Valve: The mitral valve is grossly normal. There is mild thickening of the mitral valve leaflet(s). There is mild calcification of the mitral valve leaflet(s). Mild mitral valve regurgitation. Tricuspid Valve: The tricuspid valve is grossly normal. Tricuspid valve regurgitation is trivial. Aortic Valve: The aortic valve is tricuspid. Aortic valve regurgitation is mild. Aortic regurgitation PHT measures 302 msec. Moderate to severe aortic stenosis is present. There is moderate calcification of the aortic valve. Aortic valve mean gradient measures 37.0 mmHg. Aortic valve peak gradient measures 66.3 mmHg. Aortic valve area, by VTI measures 1.05 cm. Pulmonic Valve: The pulmonic valve was not well visualized. Pulmonic valve regurgitation is trivial. Aorta: Aortic dilatation noted. There is mild dilatation of the ascending aorta. Venous: The inferior vena cava is normal in size with less than 50% respiratory variability, suggesting right atrial pressure of 8 mmHg. IAS/Shunts: No atrial level shunt detected by color flow Doppler. Additional Comments: There is pleural effusion in the left lateral region.  LEFT VENTRICLE PLAX 2D LVIDd:         5.00 cm LVIDs:  3.80 cm LV PW:         0.90 cm LV IVS:        1.10 cm LVOT diam:     2.30 cm      3D Volume EF: LV SV:         82           3D EF:        48 % LV SV Index:   38           LV EDV:       191 ml LVOT Area:     4.15 cm     LV ESV:       99 ml                             LV SV:        92 ml  LV Volumes (MOD) LV vol d, MOD A2C: 135.0 ml LV vol d, MOD A4C: 146.0 ml LV vol s, MOD A2C: 91.6 ml LV vol s, MOD A4C: 94.8 ml LV SV MOD A2C:     43.4 ml LV SV MOD A4C:     146.0 ml LV SV MOD BP:      48.9 ml RIGHT VENTRICLE RV S prime:     16.80 cm/s TAPSE (M-mode): 2.7 cm LEFT ATRIUM             Index       RIGHT ATRIUM           Index LA diam:        4.00 cm 1.83 cm/m  RA Area:     12.20 cm LA Vol (A2C):   81.5 ml 37.29 ml/m RA Volume:   21.40 ml  9.79 ml/m LA Vol (A4C):    56.1 ml 25.67 ml/m LA Biplane Vol: 70.8 ml 32.40 ml/m  AORTIC VALVE AV Area (Vmax):    1.10 cm AV Area (Vmean):   1.10 cm AV Area (VTI):     1.05 cm AV Vmax:           407.00 cm/s AV Vmean:          280.000 cm/s AV VTI:            0.785 m AV Peak Grad:      66.3 mmHg AV Mean Grad:      37.0 mmHg LVOT Vmax:         107.60 cm/s LVOT Vmean:        74.200 cm/s LVOT VTI:          0.198 m LVOT/AV VTI ratio: 0.25 AI PHT:            302 msec  AORTA Ao Root diam: 3.50 cm Ao Asc diam:  4.10 cm  SHUNTS Systemic VTI:  0.20 m Systemic Diam: 2.30 cm Rozann Lesches MD Electronically signed by Rozann Lesches MD Signature Date/Time: 07/18/2020/3:35:36 PM    Final      Assessment and Plan: Fornier's Gangrene:   Upper aspects of wound are viable without necrosis.  Continue current care.       LOS: 7 days    Irine Seal 07/19/2020 [phone] ID: Omar Todd, male   DOB: 10/09/1951, 69 y.o.   MRN: [id number]

## 2020-07-19 NOTE — Progress Notes (Signed)
[facility] Progress Note Patient Name: Omar Todd DOB: [birth year] MRN: [id number]   Date of Service  07/19/2020  HPI/Events of Note  Patient with sub-optimal blood pressure control.  eICU Interventions  Labetalol 5-10 mg iv Q 2 hours PRN SBP > 170 mmHg.        [name] [name] 07/19/2020, 1:37 AM

## 2020-07-20 ENCOUNTER — Inpatient Hospital Stay (HOSPITAL_COMMUNITY): Payer: Medicare Other | Admitting: Certified Registered"

## 2020-07-20 ENCOUNTER — Inpatient Hospital Stay (HOSPITAL_COMMUNITY): Payer: Medicare Other

## 2020-07-20 ENCOUNTER — Encounter (HOSPITAL_COMMUNITY): Admission: EM | Disposition: E | Payer: Self-pay | Source: Home / Self Care | Attending: Pulmonary Disease

## 2020-07-20 DIAGNOSIS — T8781 Dehiscence of amputation stump: Secondary | ICD-10-CM

## 2020-07-20 HISTORY — PX: LAPAROSCOPY: SHX197

## 2020-07-20 HISTORY — PX: INCISION AND DRAINAGE ABSCESS: SHX5864

## 2020-07-20 LAB — PROCALCITONIN: Procalcitonin: 1.26 ng/mL

## 2020-07-20 LAB — CBC
HCT: 25.1 % — ABNORMAL LOW (ref 39.0–52.0)
Hemoglobin: 7.1 g/dL — ABNORMAL LOW (ref 13.0–17.0)
MCH: 24.2 pg — ABNORMAL LOW (ref 26.0–34.0)
MCHC: 28.3 g/dL — ABNORMAL LOW (ref 30.0–36.0)
MCV: 85.7 fL (ref 80.0–100.0)
Platelets: 209 10*3/uL (ref 150–400)
RBC: 2.93 MIL/uL — ABNORMAL LOW (ref 4.22–5.81)
RDW: 19.9 % — ABNORMAL HIGH (ref 11.5–15.5)
WBC: 28.8 10*3/uL — ABNORMAL HIGH (ref 4.0–10.5)
nRBC: 0.1 % (ref 0.0–0.2)

## 2020-07-20 LAB — GLUCOSE, CAPILLARY
Glucose-Capillary: 164 mg/dL — ABNORMAL HIGH (ref 70–99)
Glucose-Capillary: 177 mg/dL — ABNORMAL HIGH (ref 70–99)
Glucose-Capillary: 194 mg/dL — ABNORMAL HIGH (ref 70–99)
Glucose-Capillary: 206 mg/dL — ABNORMAL HIGH (ref 70–99)
Glucose-Capillary: 219 mg/dL — ABNORMAL HIGH (ref 70–99)

## 2020-07-20 LAB — COMPREHENSIVE METABOLIC PANEL
ALT: 51 U/L — ABNORMAL HIGH (ref 0–44)
AST: 34 U/L (ref 15–41)
Albumin: 1 g/dL — ABNORMAL LOW (ref 3.5–5.0)
Alkaline Phosphatase: 184 U/L — ABNORMAL HIGH (ref 38–126)
Anion gap: 8 (ref 5–15)
BUN: 82 mg/dL — ABNORMAL HIGH (ref 8–23)
CO2: 22 mmol/L (ref 22–32)
Calcium: 7.5 mg/dL — ABNORMAL LOW (ref 8.9–10.3)
Chloride: 110 mmol/L (ref 98–111)
Creatinine, Ser: 1.38 mg/dL — ABNORMAL HIGH (ref 0.61–1.24)
GFR calc Af Amer: >60 mL/min (ref >60)
GFR calc non Af Amer: 52 mL/min — ABNORMAL LOW (ref >60)
Glucose, Bld: 212 mg/dL — ABNORMAL HIGH (ref 70–99)
Potassium: 5.1 mmol/L (ref 3.5–5.1)
Sodium: 140 mmol/L (ref 135–145)
Total Bilirubin: 0.4 mg/dL (ref 0.3–1.2)
Total Protein: 4.6 g/dL — ABNORMAL LOW (ref 6.5–8.1)

## 2020-07-20 LAB — TRIGLYCERIDES: Triglycerides: 45 mg/dL (ref <150)

## 2020-07-20 LAB — MAGNESIUM: Magnesium: 2 mg/dL (ref 1.7–2.4)

## 2020-07-20 LAB — PHOSPHORUS: Phosphorus: 5.1 mg/dL — ABNORMAL HIGH (ref 2.5–4.6)

## 2020-07-20 LAB — TROPONIN I (HIGH SENSITIVITY): Troponin I (High Sensitivity): 304 ng/L (ref <18)

## 2020-07-20 LAB — SARS CORONAVIRUS 2 BY RT PCR (HOSPITAL ORDER, PERFORMED IN ~~LOC~~ HOSPITAL LAB): SARS Coronavirus 2: NEGATIVE

## 2020-07-20 IMAGING — DX DG CHEST 1V PORT
1 series · 1 of 1 positions shown · non-contrast
Comparison: Multiple recent previous exams.

CLINICAL DATA: Ventilator dependence.

EXAM:
PORTABLE CHEST 1 VIEW

[chest ap]
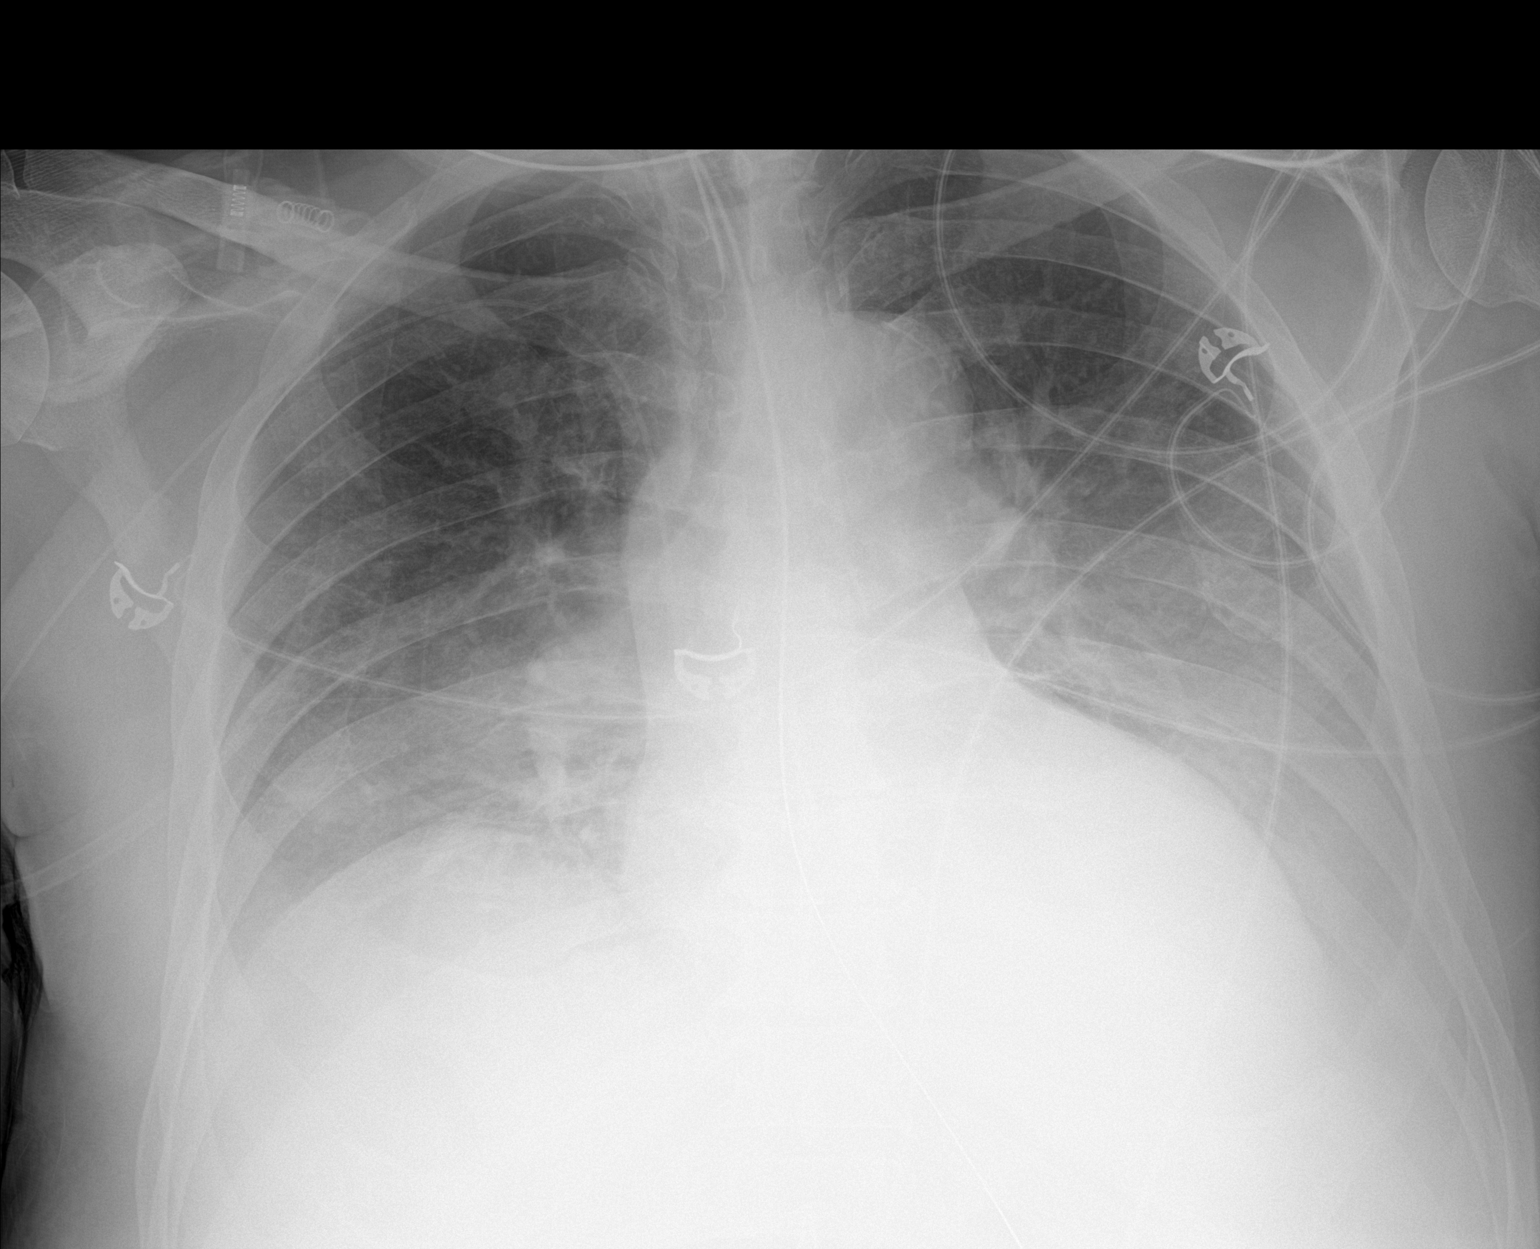

[1 of 1 positions shown; findings below may reference images not displayed]

FINDINGS: [VG] hours. Endotracheal tube tip is 5.2 cm above the base of the
carina. The NG tube passes into the stomach although the distal tip
position is not included on the film. Right PICC line tip overlies
distal SVC. Low lung volumes with stable asymmetric elevation right
hemidiaphragm. Bibasilar collapse/consolidative opacity with
probable bilateral pleural effusions. Vascular congestion again
noted. The visualized bony structures of the thorax show now acute
abnormality. Telemetry leads overlie the chest.
IMPRESSION: No substantial interval change in exam. Bibasilar
collapse/consolidative opacity with bilateral pleural effusions.

Cardiomegaly with vascular congestion.

## 2020-07-20 SURGERY — LAPAROSCOPY, DIAGNOSTIC
Anesthesia: General

## 2020-07-20 MED ORDER — FENTANYL CITRATE (PF) 100 MCG/2ML IJ SOLN
INTRAMUSCULAR | Status: AC
  Filled 2020-07-20: qty 2

## 2020-07-20 MED ORDER — PHENYLEPHRINE HCL (PRESSORS) 10 MG/ML IV SOLN
INTRAVENOUS | Status: AC
  Filled 2020-07-20: qty 1

## 2020-07-20 MED ORDER — VANCOMYCIN HCL IN DEXTROSE 1-5 GM/200ML-% IV SOLN
1000.0000 mg | Freq: Two times a day (BID) | INTRAVENOUS | Status: DC
  Administered 2020-07-20: 1000 mg via INTRAVENOUS
  Filled 2020-07-20: qty 200

## 2020-07-20 MED ORDER — EPHEDRINE 5 MG/ML INJ
INTRAVENOUS | Status: AC
  Filled 2020-07-20: qty 10

## 2020-07-20 MED ORDER — ROCURONIUM BROMIDE 10 MG/ML (PF) SYRINGE
PREFILLED_SYRINGE | INTRAVENOUS | Status: DC | PRN
  Administered 2020-07-20: 100 mg via INTRAVENOUS

## 2020-07-20 MED ORDER — PHENYLEPHRINE HCL-NACL 10-0.9 MG/250ML-% IV SOLN
INTRAVENOUS | Status: DC | PRN
  Administered 2020-07-20: 40 ug/min via INTRAVENOUS

## 2020-07-20 MED ORDER — PHENYLEPHRINE 40 MCG/ML (10ML) SYRINGE FOR IV PUSH (FOR BLOOD PRESSURE SUPPORT)
PREFILLED_SYRINGE | INTRAVENOUS | Status: DC | PRN
  Administered 2020-07-20 (×2): 120 ug via INTRAVENOUS

## 2020-07-20 MED ORDER — VANCOMYCIN VARIABLE DOSE PER UNSTABLE RENAL FUNCTION (PHARMACIST DOSING): Status: DC

## 2020-07-20 MED ORDER — 0.9 % SODIUM CHLORIDE (POUR BTL) OPTIME
TOPICAL | Status: DC | PRN
  Administered 2020-07-20: 1000 mL

## 2020-07-20 MED ORDER — EPHEDRINE SULFATE-NACL 50-0.9 MG/10ML-% IV SOSY
PREFILLED_SYRINGE | INTRAVENOUS | Status: DC | PRN
  Administered 2020-07-20: 5 mg via INTRAVENOUS

## 2020-07-20 MED ORDER — FENTANYL CITRATE (PF) 100 MCG/2ML IJ SOLN
INTRAMUSCULAR | Status: DC | PRN
Start: 2020-07-20 — End: 2020-07-20
  Administered 2020-07-20: 100 ug via INTRAVENOUS

## 2020-07-20 SURGICAL SUPPLY — 73 items
ADH SKN CLS APL DERMABOND .7 (GAUZE/BANDAGES/DRESSINGS) ×1
APL PRP STRL LF DISP 70% ISPRP (MISCELLANEOUS) ×1
APL SKNCLS STERI-STRIP NONHPOA (GAUZE/BANDAGES/DRESSINGS) ×1
APPLIER CLIP 5 13 M/L LIGAMAX5 (MISCELLANEOUS)
APR CLP MED LRG 5 ANG JAW (MISCELLANEOUS)
BENZOIN TINCTURE PRP APPL 2/3 (GAUZE/BANDAGES/DRESSINGS) ×3 IMPLANT
BLADE CLIPPER SURG (BLADE) IMPLANT
BNDG ADH 1X3 SHEER STRL LF (GAUZE/BANDAGES/DRESSINGS) ×15 IMPLANT
BNDG ADH THN 3X1 STRL LF (GAUZE/BANDAGES/DRESSINGS) ×5
BNDG CMPR 82X61 PLY HI ABS (GAUZE/BANDAGES/DRESSINGS) ×1
BNDG CONFORM 6X.82 1P STRL (GAUZE/BANDAGES/DRESSINGS) ×3 IMPLANT
BNDG GAUZE ELAST 4 BULKY (GAUZE/BANDAGES/DRESSINGS) IMPLANT
CABLE HIGH FREQUENCY MONO STRZ (ELECTRODE) ×3 IMPLANT
CHLORAPREP W/TINT 26 (MISCELLANEOUS) ×3 IMPLANT
CLIP APPLIE 5 13 M/L LIGAMAX5 (MISCELLANEOUS) IMPLANT
CLOSURE WOUND 1/2 X4 (GAUZE/BANDAGES/DRESSINGS) ×1
COVER SURGICAL LIGHT HANDLE (MISCELLANEOUS) ×3 IMPLANT
COVER WAND RF STERILE (DRAPES) IMPLANT
DECANTER SPIKE VIAL GLASS SM (MISCELLANEOUS) ×3 IMPLANT
DERMABOND ADVANCED (GAUZE/BANDAGES/DRESSINGS) ×2
DERMABOND ADVANCED .7 DNX12 (GAUZE/BANDAGES/DRESSINGS) ×1 IMPLANT
DRAIN CHANNEL 19F RND (DRAIN) IMPLANT
DRAPE LAPAROTOMY T 98X78 PEDS (DRAPES) ×3 IMPLANT
DRAPE UTILITY XL STRL (DRAPES) ×3 IMPLANT
DRSG OPSITE POSTOP 4X6 (GAUZE/BANDAGES/DRESSINGS) IMPLANT
DRSG OPSITE POSTOP 4X8 (GAUZE/BANDAGES/DRESSINGS) IMPLANT
DRSG PAD ABDOMINAL 8X10 ST (GAUZE/BANDAGES/DRESSINGS) ×3 IMPLANT
DRSG TEGADERM 4X4.75 (GAUZE/BANDAGES/DRESSINGS) ×3 IMPLANT
ELECT REM PT RETURN 15FT ADLT (MISCELLANEOUS) ×3 IMPLANT
EVACUATOR SILICONE 100CC (DRAIN) IMPLANT
GAUZE 4X4 16PLY RFD (DISPOSABLE) ×3 IMPLANT
GAUZE SPONGE 4X4 12PLY STRL (GAUZE/BANDAGES/DRESSINGS) IMPLANT
GLOVE BIOGEL PI IND STRL 7.0 (GLOVE) ×1 IMPLANT
GLOVE BIOGEL PI IND STRL 7.5 (GLOVE) ×1 IMPLANT
GLOVE BIOGEL PI INDICATOR 7.0 (GLOVE) ×2
GLOVE BIOGEL PI INDICATOR 7.5 (GLOVE) ×2
GLOVE SURG SS PI 7.0 STRL IVOR (GLOVE) ×3 IMPLANT
GOWN STRL REUS W/TWL LRG LVL3 (GOWN DISPOSABLE) ×6 IMPLANT
GOWN STRL REUS W/TWL XL LVL3 (GOWN DISPOSABLE) ×6 IMPLANT
IRRIG SUCT STRYKERFLOW 2 WTIP (MISCELLANEOUS)
IRRIGATION SUCT STRKRFLW 2 WTP (MISCELLANEOUS) IMPLANT
KIT BASIN OR (CUSTOM PROCEDURE TRAY) ×3 IMPLANT
KIT TURNOVER KIT A (KITS) IMPLANT
NEEDLE HYPO 22GX1.5 SAFETY (NEEDLE) ×3 IMPLANT
NEEDLE HYPO 25X1 1.5 SAFETY (NEEDLE) ×3 IMPLANT
PACK BASIC VI WITH GOWN DISP (CUSTOM PROCEDURE TRAY) ×3 IMPLANT
PACK GENERAL/GYN (CUSTOM PROCEDURE TRAY) ×3 IMPLANT
PENCIL SMOKE EVACUATOR (MISCELLANEOUS) IMPLANT
RELOAD PROXIMATE 75MM BLUE (ENDOMECHANICALS) IMPLANT
SCISSORS LAP 5X35 DISP (ENDOMECHANICALS) ×3 IMPLANT
SET TUBE SMOKE EVAC HIGH FLOW (TUBING) ×3 IMPLANT
SHEARS HARMONIC ACE PLUS 36CM (ENDOMECHANICALS) IMPLANT
SLEEVE XCEL OPT CAN 5 100 (ENDOMECHANICALS) ×6 IMPLANT
SPONGE LAP 18X18 RF (DISPOSABLE) ×3 IMPLANT
STAPLER GUN LINEAR PROX 60 (STAPLE) IMPLANT
STAPLER PROXIMATE 75MM BLUE (STAPLE) IMPLANT
STRIP CLOSURE SKIN 1/2X4 (GAUZE/BANDAGES/DRESSINGS) ×2 IMPLANT
SUT ETHILON 2 0 PS N (SUTURE) ×3 IMPLANT
SUT MNCRL AB 4-0 PS2 18 (SUTURE) ×3 IMPLANT
SUT NOVA NAB GS-21 0 18 T12 DT (SUTURE) ×3 IMPLANT
SUT PDS AB 0 CT1 36 (SUTURE) IMPLANT
SUT SILK 2 0 SH CR/8 (SUTURE) ×3 IMPLANT
SUT VIC AB 2-0 SH 27 (SUTURE) ×3
SUT VIC AB 2-0 SH 27X BRD (SUTURE) ×1 IMPLANT
SUT VIC AB 3-0 SH 18 (SUTURE) ×3 IMPLANT
SUT VIC AB 3-0 SH 27 (SUTURE) ×3
SUT VIC AB 3-0 SH 27XBRD (SUTURE) ×1 IMPLANT
SUT VIC AB 3-0 SH 8-18 (SUTURE) ×3 IMPLANT
SYR CONTROL 10ML LL (SYRINGE) ×3 IMPLANT
TOWEL OR 17X26 10 PK STRL BLUE (TOWEL DISPOSABLE) ×3 IMPLANT
TOWEL OR NON WOVEN STRL DISP B (DISPOSABLE) ×3 IMPLANT
TROCAR BLADELESS OPT 5 100 (ENDOMECHANICALS) ×3 IMPLANT
TROCAR XCEL 12X100 BLDLESS (ENDOMECHANICALS) IMPLANT

## 2020-07-20 NOTE — Anesthesia Preprocedure Evaluation (Signed)
Anesthesia Evaluation  Patient identified by MRN, date of birth, ID band Patient unresponsive    Reviewed: NPO status , Unable to perform ROS - Chart review onlyPreop documentation limited or incomplete due to emergent nature of procedure.  Airway Mallampati: Intubated       Dental   Pulmonary COPD, Current Smoker,    Pulmonary exam normal        Cardiovascular hypertension, Pt. on medications Normal cardiovascular exam  Echo  1. Left ventricular ejection fraction, by estimation, is approximately 35%. The left ventricle has moderately decreased function. The left ventricle demonstrates regional wall motion abnormalities (see scoring  diagram/findings for description). Left ventricular diastolic parameters are consistent with Grade I diastolic dysfunction (impaired relaxation).  2. Right ventricular systolic function is normal. The right ventricular size is normal. Tricuspid regurgitation signal is inadequate for assessing PA pressure.  3. The mitral valve is grossly normal, mildly thickened and calcified. Mild mitral valve regurgitation.  4. The aortic valve is probably tricuspid based on limited views with moderate calcification. Aortic valve regurgitation is mild. Moderate to severe aortic valve stenosis. Aortic valve mean gradient measures 37.0 mmHg. Aortic valve Vmax measures 4.07  m/s.  5. Aortic dilatation noted. There is mild dilatation of the ascending aorta.  6. The inferior vena cava is normal in size with <50% respiratory variability, suggesting right atrial pressure of 8 mmHg.    Neuro/Psych    GI/Hepatic GERD  Medicated and Controlled,  Endo/Other  diabetes, Type 2  Renal/GU      Musculoskeletal   Abdominal   Peds  Hematology  (+) anemia ,   Anesthesia Other Findings   Reproductive/Obstetrics                             Anesthesia Physical  Anesthesia Plan  ASA:  IV  Anesthesia Plan: General   Post-op Pain Management:    Induction: Intravenous  PONV Risk Score and Plan: Treatment may vary due to age or medical condition and Ondansetron  Airway Management Planned: Oral ETT  Additional Equipment:   Intra-op Plan:   Post-operative Plan: Post-operative intubation/ventilation  Informed Consent: I have reviewed the patients History and Physical, chart, labs and discussed the procedure including the risks, benefits and alternatives for the proposed anesthesia with the patient or authorized representative who has indicated his/her understanding and acceptance.       Plan Discussed with: CRNA  Anesthesia Plan Comments:        Anesthesia Quick Evaluation

## 2020-07-20 NOTE — TOC Progression Note (Signed)
[facility]) - Progression Note    Patient Details  Name: Omar Todd MRN: [id number] Date of Birth: 09-Sep-1951  [facility] of [facility]) CM/SW Contact  Leeroy Cha, RN Phone Number: 07/01/2020, 8:51 AM  Clinical Narrative:    Remains on the vent at 60 % fi02, temp 101.2, iv rocephin,vanco,cleocin,garamycin, tube feed, iv fentyl. Following for progression and toc needs.        Expected Discharge Plan and Services                                                 Social Determinants of Health (SDOH) Interventions    Readmission Risk Interventions Readmission Risk Prevention Plan 05/18/2020  Transportation Screening Complete  PCP or Specialist Appt within 5-7 Days Not Complete  Not Complete comments plan for SNF  PCP or Specialist Appt within 3-5 Days Not Complete  Not Complete comments plan for SNF  Home Care Screening Complete  Medication Review (RN CM) Referral to Pharmacy  HRI or [place] Complete  Social Work Consult for [place] Planning/Counseling Complete  Palliative Care Screening Not Complete  Palliative Care Screening Not Complete Comments could be appropriate  Medication Review (RN Transport planner) Referral to Pharmacy  Some recent data might be hidden

## 2020-07-20 NOTE — Progress Notes (Signed)
Pharmacy Brief Note - Antimicrobial Follow Up:  Pt is on vancomycin for sacral decubitus ulcer, necrotizing fasciitis.   Assessment:  SCr = 1.38. Renal function worsening, SCr has more than double in the past 48 hours  Most recent VT = 26 mcg/mL was SUPRAtherapeutic on 8/29 on vancomycin 1000 mg IV q8h regimen. Currently on vancomycin 1000 mg IV q12h  Plan:  Given worsening renal function, will discontinue scheduled vancomycin at this time  Will check a vancomycin level tomorrow, resume dosing once vanc level <15 mcg/mL  Lenis Noon, PharmD 07/13/2020

## 2020-07-20 NOTE — Progress Notes (Signed)
Patient taken to procedure by CRNA. Vent on standby.

## 2020-07-20 NOTE — Transfer of Care (Signed)
Immediate Anesthesia Transfer of Care Note  Patient: Omar Todd  Procedure(s) Performed: laparascopic diverting colostomy (N/A ) sacral wound debridement (N/A )  Patient Location: ICU  Anesthesia Type:General  Level of Consciousness: Patient remains intubated per anesthesia plan  Airway & Oxygen Therapy: Patient remains intubated per anesthesia plan  Post-op Assessment: Report given to RN and Post -op Vital signs reviewed and stable  Post vital signs: Reviewed and stable  Last Vitals:  Vitals Value Taken Time  BP 127/51 07/10/2020 1714  Temp    Pulse 87 07/10/2020 1720  Resp 17 07/02/2020 1720  SpO2 92 % 06/30/2020 1720  Vitals shown include unvalidated device data.  Last Pain:  Vitals:   07/05/2020 1219  TempSrc: Axillary  PainSc:       Patients Stated Pain Goal: 5 (86/75/44 9201)  Complications: No complications documented.

## 2020-07-20 NOTE — Progress Notes (Signed)
NAME:  Omar Todd, MRN:  [id number], DOB:  September 27, 1951, LOS: 8 ADMISSION DATE:  06/29/2020, CONSULTATION DATE:  07/14/20 REFERRING MD:  Omar Todd trnasfer, CHIEF COMPLAINT:  Omar Todd at [facility] History    30 pack current smoker with DM, COPD Nos, BP, lipid, GERD. 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 At some pont also has stage 2 sacral decub "Size of a quarter"- described at time of this discharge. Discharged to ? Rehab and then signed out ? AMA from rehab.   Readimmited to [facility] 07/19/2020 with sepsis syndrome(fever + SIRS with lactate 2.1) with growing. Rt buttock ulcer noted to be stage 3 large wth fecal soiling but R AKA stump incision well healed but  By 07/03/2020 purulent drainage noted. CT pelvis c/w necrotizing fasciitis (with sub cut gas) in right perineal soft tissue and bedside exam was concerning for crepitus and concern for Fournier gangrene. Patient noticed to be disoriented by 07/07/2020  On 06/26/2020 [facility] excisional debridement of right buttock, right inguinal region, and right scrotal skin measuring (right buttock / right inguinal wound 20cmX10cmX4cm and right scrotal skin 15cmX8cmX2cm); Drainage and packing of Right AKA wound infection  -. 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 Concern for need for repeated debridement and urology involvement and patient transferred to [facility] from [facility] on 07/14/20. Upon arrival getting PRBC for hgb < 7gm% and sedatedh diprivan gtt and fent gtt on ventilator needing neo for bp support.    Decision maker:  he has a wife at [facility] with dementia and estranged children he has not seen since they were babies.Omar Todd [phone]  She is his closest  relative and decision maker. She does not have health care power of attorney.    Past Medical History     has a past medical history of COPD (chronic obstructive pulmonary disease) ([name]), Diabetes mellitus without complication ([place]), Gangrene of toe of right foot ([name]), GERD (gastroesophageal reflux disease), Hyperlipidemia, and Hypertension.   reports that he has been smoking cigarettes. He has a 30.00 pack-year smoking history. He has never used smokeless tobacco.   has a past surgical history that includes Thumb surgery; Esophagogastroduodenoscopy (N/A, 03/13/2020); biopsy (03/13/2020); Flexible sigmoidoscopy (N/A, 05/06/2020); Amputation (Right, 05/17/2020); Application if wound vac (Right, 05/17/2020); Amputation (Right, 06/19/2020); Incision and drainage of wound (Right, 07/12/2020); Incision and drainage (Right, 07/07/2020); Incision and drainage abscess (N/A, 06/23/2020); and Wound debridement (N/A, 06/22/2020).   Significant Todd Events   07/02/2020 - [name] admit 07/14/20  - transfer to [name] 8/25 - s. I and D and debridement of Rt Gluteal Nec Fasc by CCS and ->   Inspection of wound, debridement of right testicle/cord, scrotal wound edge ( excised tissue approximately 2 x 4 cm) by Urology. On neo gtt, diprivan gtt, fent gtt . On vent 40% . Not on TF (yet). Ortho [facility] recmmended -> packing the  R AKA wound daily . Seen by Omar Gloriann Loan of Urology - currently R testicle exposed but viable tissue  8/26 = PRBC < 7 and 1 u prbc ordered. On vent. BAck to OR today, . Exposure to sister with covid + . Sister Inez Catalina the [place] reported that  she is covid positive 8/.25/2 but she has not been at bedside. Later the visitor - sster. [name] is also covid positive. APteitn repeat covid - negative  8/27 - Followed commands on [facility] 8/27 am Remains on fent gtt at 125 mcg, diprivan gtt at 30 mcg and neo gtt at 75 mcg via PICC line.  T max 101 WBC 16 HGB 7.6 Platelets 170,000 Lactate 1.3 Potassium 3.9 Mag  2 + 16 L since admission    8/28 -  +18L since admit. Looking more voume overloaded. Increased fio2 50% on vent. CXR wet. Unable to wean. On fent gtt, diprivan gtt, neo gtt and LR at 100cc/h  Consults:  8/24 - pccm  Procedures:  8/23 -  On 07/11/2020 [facility] excisional debridement of right buttock, right inguinal region, and right scrotal skin measuring (right buttock / right inguinal wound 20cmX10cmX4cm and right scrotal skin 15cmX8cmX2cm); Drainage and packing of Right AKA wound infection ETT 8/23  RUE picc 8/24  Significant Diagnostic Tests:  cxr with basal atelectasis  Micro Data:   Microbiology results: 8/22 BCx:  8/22 UCx:   8/22 Wound cx 8/23 - wound - multiple org. None predominant 8/23 wound inguinal -  KLEBSIELLA OXYTOCA     Organism ID, Bacteria STREPTOCOCCUS CONSTELLATUS    xxx 8/26 covid pcr - neg Covid negative  Antimicrobials:  Antimicrobials this admission: Cipro IV 8/22 >>8/22 Clinda 8/22 >8/22, 8/23 > 8/28 Vanc 8/22 >8/22, 8/22 >8/27, 8/27 > Cfeepime 8/22 >8/26, 8/26 > 8/27 Ceftriaxone 8/27 >> xxxx Clinda 8/30 preop Gentamicon 8/30 preop Flagyl 3 dose 8/30 preop Neomycin oral 8/29 3 doses preop xxxxx  Interim history/subjective:   8/29 - stil volume overloaded at +19.9L since admit. 40% fio2, Pn fent gtt. Off diprivan and neo. On TF.  CCS plans for 8/30 -> diverting loop colostomy for fecal diversion Todd massive soft tissue infection and necrosis limited mobility with amputation 8/30-no overnight events, off pressors  Objective   Blood pressure (!) 140/53, pulse 99, temperature (!) 97.4 F (36.3 C), temperature source Axillary, resp. rate 17, height 6\' 2"  (1.88 m), weight 91.9 kg, SpO2 97 %.    Vent Mode: PRVC FiO2 (%):  [40 %-60 %] 50 % Set Rate:  [15 bmp] 15 bmp Vt Set:  [650 mL] 650 mL PEEP:  [5 cmH20-8 cmH20] 5 cmH20 Plateau Pressure:  [17 cmH20-24 cmH20] 17 cmH20   Intake/Output Summary (Last 24 hours) at 07/19/2020 1207 Last data  filed at 07/16/2020 0600 Gross per 24 hour  Intake 1479.03 ml  Output 2550 ml  Net -1070.97 ml   Filed Weights   07/14/20 1300 07/15/2020 0403 07/18/20 0500  Weight: 80.7 kg 84.5 kg 91.9 kg   General Appearance:  Looks criticall ill. Looks puffy Head: Normocephalic, atraumatic Eyes:  PERRL - yes, conjunctiva/corneas - muddy     Ears:  Normal external ear canals, both ears Throat:  ETT TUBE - yes , OG tube - yes Neck:  Supple Lungs: Clear to auscultation bilaterally Heart:  S1 and S2 normal Abdomen:  Soft, no masses, no organomegaly Skin:  ntact in exposed areas . Sacral area - not examined Neurologic:  Sedation - fent gtt, diprivan gtt -> RASS - -4  Resolved Todd Problem list   Circulatory shock 8/28  Assessment & Plan:  Acute hypoxemic respiratory failure -Continue ventilator support -Wean as tolerated -Not ready to be extubated -Continue VAP bundle  Fournier's gangrene -Appreciate surgery input -For surgical intervention 8/30 -Continue antibiotics -Debridement and diversion today-discussed  with surgery  Chronic obstructive pulmonary disease -Continue bronchodilators  Circulatory shock -Improving -Off pressors -Pressors if MAP not maintained over 65  Moderate aortic stenosis -Hemodynamically stable  Acute kidney injury -Cautious diuresis -Trend electrolytes -Avoid nephrotoxic's  Anemia -Continue to trend -Transfuse per protocol    Best practice:  Diet: TF  Pain/Anxiety/Delirium protocol (if indicated): sedation protocol VAP protocol (if indicated): Bundle + DVT prophylaxis: Lovenox/Heparin GI prophylaxis: ppi Glucose control: ssi Mobility: bed rest Code Status: full code  Family Communication: Sister Damoni Causby main decision makder  [phone]   updated 07/18/20  Over phone. - she had questions about why being asked to set up password as new process. Offered to refer to office of patient experience. However, she wanted bedside RN to give  her  A call  8/29 - updated sister Finneas Mathe over phone   Disposition: [facility]  The patient is critically ill with multiple organ systems failure and requires high complexity decision making for assessment and support, frequent evaluation and titration of therapies, application of advanced monitoring technologies and extensive interpretation of multiple databases. Critical Care Time devoted to patient care services described in this note independent of APP/resident time (if applicable)  is 32 minutes.   Sherrilyn Rist MD [facility] Personal pager: [phone] If unanswered, please page CCM On-call: [phone]

## 2020-07-20 NOTE — Progress Notes (Signed)
Pt. Had a large emesis in the OR per CRNA. Pt. Was suctioned many times. RN will continue to monitor patient closely.

## 2020-07-20 NOTE — Op Note (Signed)
Preoperative diagnosis: necrotizing fasciitis, sacral wound  Postoperative diagnosis: same   Procedure: laparoscopic diverting sigmoid loop colostomy, debridement of 15 x 15 cm and 12 x 3 cm full thickness tissue of sacral area and gluteal areas  Surgeon: Gurney Maxin, M.D.  Asst: Barkley Boards, [facility]  Anesthesia: general  Indications for procedure: SCOT Omar [facility] is a 69 y.o. year old male with symptoms of septic shock requiring multiple debridements. On exam today he had stool in he wound and the nursing staff indicated he was changed multiple times overnight for the same issue. His tissues in the area were worsening and necrotic tissue was seen. After discussing with his sister the plan he was brought to the operating room.  Description of procedure: The patient was brought into the operative suite. Anesthesia was administered with General endotracheal anesthesia. WHO checklist was applied. The patient was then placed in supine position. The area was prepped and draped in the usual sterile fashion.  A small right subcostal incision was made. A 75mm trocar was used to gain access to the peritoneal cavity by optical entry technique. Pneumoperitoneum was applied with a high flow and low pressure. The laparoscope was reinserted to confirm position.  1 5 mm trocar was placed in the right mid abdomen and 1 5 mm trocar was placed in the right lower abdomen. The sigmoid colon was identified. It was freed from the abdominal wall and further dissection of white line of Toldt was performed to ensure the colon was mobile enough to reach the skin. An elliptical incision was made in the left upper quadrant over marked area. Cautery was used to dissect through the subcutaneous tissue and the fascia was opened in cruciate fashion. The muscle fibers were spread and the posterior fascia divided. THe colon was brought through the defect. A red rubber catheter was passed through the mesocolon and sutured to itself  with 2-0 nylon. A colotomy was made and the colon edges were sutured to the skin with 2-3 cm of Brooking using interrupted 3-0 vicryl. Additional 3-0 vicryls were placed to good apposition. All trocars were removed. The port sites were closed with 4-0 monocryl in interrupted fashion.  Next, the patient was placed in right decubitus and the sacral and gluteal areas were reprepped. There was a large eschar over the sacrum. This was removed with scalpel cautery which produced minimal bleeding consistent with necrotic skin, subcutaneous tissue and muscle. THe area of this debridement was 15 x 15 cm. The base of the previous debridement of the gluteus showed gray necrotic muscle. This was sharply debrided with scalpel cautery and included fascia, muscle, skin and subcutaneous tissues. The total area of this debridement was 3 x 12 cm. Hemostasis was then applied to a few bleeding edges. 2 4 x 4 kerlex rolls were packed into the wound. The patient was taken back to the ICU. All counts were correct  Findings: necrotic tissue of the sacral area and gluteal area  Specimen: noen  Implant: red rubber to colostomy 2 4 x 4 kerlex rolls   Blood loss: 200 ml  Local anesthesia: none  Complications: none  Gurney Maxin, M.D. General, Bariatric, & Minimally Invasive Surgery [facility]

## 2020-07-21 ENCOUNTER — Encounter (HOSPITAL_COMMUNITY): Payer: Self-pay | Admitting: General Surgery

## 2020-07-21 LAB — CBC WITH DIFFERENTIAL/PLATELET
Abs Immature Granulocytes: 0.67 10*3/uL — ABNORMAL HIGH (ref 0.00–0.07)
Basophils Absolute: 0.1 10*3/uL (ref 0.0–0.1)
Basophils Relative: 0 %
Eosinophils Absolute: 0 10*3/uL (ref 0.0–0.5)
Eosinophils Relative: 0 %
HCT: 24 % — ABNORMAL LOW (ref 39.0–52.0)
Hemoglobin: 7.1 g/dL — ABNORMAL LOW (ref 13.0–17.0)
Immature Granulocytes: 2 %
Lymphocytes Relative: 2 %
Lymphs Abs: 0.5 10*3/uL — ABNORMAL LOW (ref 0.7–4.0)
MCH: 25 pg — ABNORMAL LOW (ref 26.0–34.0)
MCHC: 29.6 g/dL — ABNORMAL LOW (ref 30.0–36.0)
MCV: 84.5 fL (ref 80.0–100.0)
Monocytes Absolute: 0.8 10*3/uL (ref 0.1–1.0)
Monocytes Relative: 2 %
Neutro Abs: 32.3 10*3/uL — ABNORMAL HIGH (ref 1.7–7.7)
Neutrophils Relative %: 94 %
Platelets: 262 10*3/uL (ref 150–400)
RBC: 2.84 MIL/uL — ABNORMAL LOW (ref 4.22–5.81)
RDW: 20.2 % — ABNORMAL HIGH (ref 11.5–15.5)
WBC: 34.4 10*3/uL — ABNORMAL HIGH (ref 4.0–10.5)
nRBC: 0.1 % (ref 0.0–0.2)

## 2020-07-21 LAB — GLUCOSE, CAPILLARY
Glucose-Capillary: 184 mg/dL — ABNORMAL HIGH (ref 70–99)
Glucose-Capillary: 190 mg/dL — ABNORMAL HIGH (ref 70–99)
Glucose-Capillary: 180 mg/dL — ABNORMAL HIGH (ref 70–99)
Glucose-Capillary: 165 mg/dL — ABNORMAL HIGH (ref 70–99)
Glucose-Capillary: 156 mg/dL — ABNORMAL HIGH (ref 70–99)
Glucose-Capillary: 138 mg/dL — ABNORMAL HIGH (ref 70–99)

## 2020-07-21 LAB — BASIC METABOLIC PANEL
Anion gap: 8 (ref 5–15)
BUN: 87 mg/dL — ABNORMAL HIGH (ref 8–23)
CO2: 21 mmol/L — ABNORMAL LOW (ref 22–32)
Calcium: 7.2 mg/dL — ABNORMAL LOW (ref 8.9–10.3)
Chloride: 113 mmol/L — ABNORMAL HIGH (ref 98–111)
Creatinine, Ser: 1.53 mg/dL — ABNORMAL HIGH (ref 0.61–1.24)
GFR calc Af Amer: 53 mL/min — ABNORMAL LOW (ref >60)
GFR calc non Af Amer: 46 mL/min — ABNORMAL LOW (ref >60)
Glucose, Bld: 196 mg/dL — ABNORMAL HIGH (ref 70–99)
Potassium: 4.4 mmol/L (ref 3.5–5.1)
Sodium: 142 mmol/L (ref 135–145)

## 2020-07-21 LAB — VANCOMYCIN, RANDOM: Vancomycin Rm: 23

## 2020-07-21 LAB — TRIGLYCERIDES: Triglycerides: 55 mg/dL (ref <150)

## 2020-07-21 LAB — PHOSPHORUS: Phosphorus: 5.2 mg/dL — ABNORMAL HIGH (ref 2.5–4.6)

## 2020-07-21 LAB — PROCALCITONIN: Procalcitonin: 2.42 ng/mL

## 2020-07-21 MED ORDER — MIDAZOLAM 50MG/50ML (1MG/ML) PREMIX INFUSION
1.0000 mg/h | INTRAVENOUS | Status: DC
  Administered 2020-07-21 – 2020-07-22 (×2): 1 mg/h via INTRAVENOUS
  Filled 2020-07-21 (×2): qty 50

## 2020-07-21 MED ORDER — ADULT MULTIVITAMIN W/MINERALS CH
1.0000 | ORAL_TABLET | Freq: Every day | ORAL | Status: DC
  Administered 2020-07-21 – 2020-07-26 (×6): 1 via ORAL
  Filled 2020-07-21 (×6): qty 1

## 2020-07-21 MED ORDER — LINEZOLID 600 MG PO TABS
600.0000 mg | ORAL_TABLET | Freq: Two times a day (BID) | ORAL | Status: DC
  Administered 2020-07-21 – 2020-07-26 (×10): 600 mg
  Filled 2020-07-21 (×12): qty 1

## 2020-07-21 MED ORDER — PANTOPRAZOLE SODIUM 40 MG PO PACK
40.0000 mg | PACK | Freq: Every day | ORAL | Status: DC
  Administered 2020-07-22 – 2020-07-26 (×5): 40 mg
  Filled 2020-07-21 (×5): qty 20

## 2020-07-21 NOTE — Consult Note (Addendum)
[facility] Nurse ostomy consult note Surgical team is following for assessment and plan of care for sacrum/buttocks/scrotum wounds  Stoma type/location: Pt had colostomy surgery performed yesterday. Stomal assessment/size: Stoma is red and viable, 1 1/2 inches, above skin level, red rubber rod in place. Mod amt old clotted blood on stoma. Current pouch is leaking behind the barrier.  Peristomal assessment: intact skin surrounding Output: no stool or flatus, small amt blood-tinged drainage  Applied 2 piece pouch with barrier ring to attempt to maintain a seal.  Education provided:  Pt is critically ill and intubated. No family members present.  [place] team will begin teaching sessions when pt is stable and out of ICU. Educational materials left in room and extra pouches/wafers/barrier rings left at the bedside for staff nurse use. Enrolled patient in [place] program: Not yet Julien Girt MSN, [place], [place], [place], [place]

## 2020-07-21 NOTE — Progress Notes (Signed)
NAME:  Omar [facility], MRN:  [id number], DOB:  12/10/1950, LOS: 9 ADMISSION DATE:  07/06/2020, CONSULTATION DATE:  07/14/20 REFERRING MD:  Omar Todd trnasfer, CHIEF COMPLAINT:  Dr Omar [facility] at [facility] History    30 pack current smoker with DM, COPD Nos, BP, lipid, GERD. 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 At some pont also has stage 2 sacral decub "Size of a quarter"- described at time of this discharge. Discharged to ? Rehab and then signed out ? AMA from rehab.   Readimmited to [facility] 07/03/2020 with sepsis syndrome(fever + SIRS with lactate 2.1) with growing. Rt buttock ulcer noted to be stage 3 large wth fecal soiling but R AKA stump incision well healed but  By 07/08/2020 purulent drainage noted. CT pelvis c/w necrotizing fasciitis (with sub cut gas) in right perineal soft tissue and bedside exam was concerning for crepitus and concern for Fournier gangrene. Patient noticed to be disoriented by 07/12/2020  On 06/23/2020 [facility] excisional debridement of right buttock, right inguinal region, and right scrotal skin measuring (right buttock / right inguinal wound 20cmX10cmX4cm and right scrotal skin 15cmX8cmX2cm); Drainage and packing of Right AKA wound infection  -. 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 Concern for need for repeated debridement and urology involvement and patient transferred to [facility] from [facility] on 07/14/20. Upon arrival getting PRBC for hgb < 7gm% and sedatedh diprivan gtt and fent gtt on ventilator needing neo for bp support.    Decision maker:  he has a wife at [facility] with dementia and estranged children he has not seen since they were babies.Omar Todd [phone]  She is his closest  relative and decision maker. She does not have health care power of attorney.    Past Medical History     has a past medical history of COPD (chronic obstructive pulmonary disease) ([place]), Diabetes mellitus without complication ([place]), Gangrene of toe of right foot ([place]), GERD (gastroesophageal reflux disease), Hyperlipidemia, and Hypertension.   reports that he has been smoking cigarettes. He has a 30.00 pack-year smoking history. He has never used smokeless tobacco.   has a past surgical history that includes Thumb surgery; Esophagogastroduodenoscopy (N/A, 03/13/2020); biopsy (03/13/2020); Flexible sigmoidoscopy (N/A, 05/06/2020); Amputation (Right, 05/17/2020); Application if wound vac (Right, 05/17/2020); Amputation (Right, 06/19/2020); Incision and drainage of wound (Right, 06/27/2020); Incision and drainage (Right, 07/12/2020); Incision and drainage abscess (N/A, 07/15/2020); Wound debridement (N/A, 07/12/2020); laparoscopy (N/A, 07/19/2020); and Incision and drainage abscess (N/A, 07/05/2020).   Significant Hospital Events   06/26/2020 - [place] admit 07/14/20  - transfer to [facility] 8/25 - s. I and D and debridement of Rt Gluteal Nec Fasc by CCS and ->   Inspection of wound, debridement of right testicle/cord, scrotal wound edge ( excised tissue approximately 2 x 4 cm) by Urology. On neo gtt, diprivan gtt, fent gtt . On vent 40% . Not on TF (yet). Ortho Dr [facility] recmmended -> packing the  R AKA wound daily . Seen by Dr Omar [facility] of Urology - currently R testicle exposed but viable tissue  8/26 = PRBC < 7 and 1 u prbc ordered. On vent. BAck to OR today, . Exposure to sister with  covid + . Sister Omar [facility] the [facility] reported that she is covid positive 8/.25/2 but she has not been at bedside. Later the visitor - sster. Omar [facility] is also covid positive. APteitn repeat covid - negative  8/27 - Followed commands on [facility] 8/27 am Remains on fent gtt at 125 mcg, diprivan gtt at 30 mcg and neo gtt at 75 mcg via PICC line.  T  max 101 WBC 16 HGB 7.6 Platelets 170,000 Lactate 1.3 Potassium 3.9 Mag 2 + 16 L since admission    8/28 -  +18L since admit. Looking more voume overloaded. Increased fio2 50% on vent. CXR wet. Unable to wean. On fent gtt, diprivan gtt, neo gtt and LR at 100cc/h  Consults:  8/24 - pccm  Procedures:  8/23 -  On 07/12/2020 [facility] excisional debridement of right buttock, right inguinal region, and right scrotal skin measuring (right buttock / right inguinal wound 20cmX10cmX4cm and right scrotal skin 15cmX8cmX2cm); Drainage and packing of Right AKA wound infection ETT 8/23  RUE picc 8/24  Significant Diagnostic Tests:  cxr with basal atelectasis  Micro Data:   Microbiology results: 8/22 BCx:  8/22 UCx:   8/22 Wound cx 8/23 - wound - multiple org. None predominant 8/23 wound inguinal -  KLEBSIELLA OXYTOCA     Organism ID, Bacteria STREPTOCOCCUS CONSTELLATUS    xxx 8/26 covid pcr - neg Covid negative  Antimicrobials:  Antimicrobials this admission: Cipro IV 8/22 >>8/22 Clinda 8/22 >8/22, 8/23 > 8/28 Vanc 8/22 >8/22, 8/22 >8/27, 8/27 > Cfeepime 8/22 >8/26, 8/26 > 8/27 Ceftriaxone 8/27 >> xxxx Clinda 8/30 preop Gentamicon 8/30 preop Flagyl 3 dose 8/30 preop Neomycin oral 8/29 3 doses preop xxxxx  Interim history/subjective:   8/29 - stil volume overloaded at +19.9L since admit. 40% fio2, Pn fent gtt. Off diprivan and neo. On TF.  CCS plans for 8/30 -> diverting loop colostomy for fecal diversion Todd massive soft tissue infection and necrosis limited mobility with amputation 8/30-no overnight events, off pressors  Objective   Blood pressure (!) 160/63, pulse (!) 101, temperature 99.6 F (37.6 C), temperature source Axillary, resp. rate (!) 22, height 6\' 2"  (1.88 m), weight 92.4 kg, SpO2 100 %.    Vent Mode: PRVC FiO2 (%):  [40 %-50 %] 40 % Set Rate:  [15 bmp] 15 bmp Vt Set:  [650 mL] 650 mL PEEP:  [5 cmH20] 5 cmH20 Plateau Pressure:  [16 cmH20-22 cmH20] 17  cmH20   Intake/Output Summary (Last 24 hours) at 07/21/2020 1338 Last data filed at 07/21/2020 0900 Gross per 24 hour  Intake 2059.52 ml  Output 4735 ml  Net -2675.48 ml   Filed Weights   07/07/2020 0403 07/18/20 0500 07/21/20 0500  Weight: 84.5 kg 91.9 kg 92.4 kg   General Appearance: Critically ill-appearing Head: Normocephalic, atraumatic Eyes: Pupils equal and reactive Throat:   endotracheal tube in place Neck:  Supple Lungs: Clear to auscultation bilaterally Heart:  S1 and S2 normal Abdomen:  Soft, no masses, no organomegaly Skin:  ntact in exposed areas   Resolved Hospital Problem list   Circulatory shock 8/28  Assessment & Plan:  Acute hypoxemic respiratory failure Continue ventilator support Wean as tolerated Not ready to be extubated Continue VAP bundle  Fournier's gangrene Post debridement Post diverting colostomy Continue antibiotics-discontinue vancomycin, switched to Zyvox to cover MRSA  Chronic obstructive pulmonary disease Continue bronchodilators  Circulatory shock Off pressors May require pressors for MAP greater than 65  Moderate aortic stenosis -Hemodynamically stable  Acute kidney injury -  Cautious diuresis -Trend electrolytes -Avoid nephrotoxic's  Anemia -Continue to trend -Transfuse per protocol    Best practice:  Diet: TF  Pain/Anxiety/Delirium protocol (if indicated): sedation protocol VAP protocol (if indicated): Bundle + DVT prophylaxis: Lovenox/Heparin GI prophylaxis: ppi Glucose control: ssi Mobility: bed rest Code Status: full code  Family Communication: Sister Willow Reczek main decision makder  [phone]   updated 07/18/20  Over phone. - she had questions about why being asked to set up password as new process. Offered to refer to office of patient experience. However, she wanted bedside RN to give her  A call  8/29 - updated sister Quayshawn Nin over phone   Disposition: [facility]  The patient is critically  ill with multiple organ systems failure and requires high complexity decision making for assessment and support, frequent evaluation and titration of therapies, application of advanced monitoring technologies and extensive interpretation of multiple databases. Critical Care Time devoted to patient care services described in this note independent of APP/resident time (if applicable)  is 32 minutes.   Sherrilyn Rist MD [place] [facility] Personal pager: [phone] If unanswered, please page CCM On-call: [phone]

## 2020-07-21 NOTE — Progress Notes (Signed)
eLink Physician-Brief Progress Note Patient Name: Omar [facility] DOB: 07/12/1951 MRN: [id number]   Date of Service  07/21/2020  HPI/Events of Note  Patient needs a.m. labs.  eICU Interventions  Labs ordered.        [name] Fabian Coca 07/21/2020, 3:39 AM

## 2020-07-21 NOTE — Anesthesia Postprocedure Evaluation (Signed)
Anesthesia Post Note  Patient: Omar [facility]  Procedure(s) Performed: laparascopic diverting colostomy (N/A ) sacral wound debridement (N/A )     Patient location during evaluation: SICU Anesthesia Type: General Level of consciousness: sedated Pain management: pain level controlled Vital Signs Assessment: post-procedure vital signs reviewed and stable Respiratory status: patient on ventilator - see flowsheet for VS Cardiovascular status: stable Anesthetic complications: no   No complications documented.  Last Vitals:  Vitals:   07/21/20 1925 07/21/20 2000  BP: (!) 169/53 (!) 133/47  Pulse: 94 94  Resp: (!) 21 20  Temp:  (!) 38.3 C  SpO2: 100% 100%    Last Pain:  Vitals:   07/21/20 2000  TempSrc: Axillary  PainSc:                  [name]

## 2020-07-21 NOTE — Progress Notes (Signed)
[facility] Progress Note  1 Day Post-Op  Subjective: Sedated on the vent. Critically ill.   Objective: Vital signs in last 24 hours: Temp:  [98.4 F (36.9 C)-100.5 F (38.1 C)] 100 F (37.8 C) (08/31 0819) Pulse Rate:  [86-108] 101 (08/31 0600) Resp:  [15-24] 22 (08/31 0600) BP: (109-187)/(44-74) 160/63 (08/31 0600) SpO2:  [88 %-98 %] 98 % (08/31 0819) FiO2 (%):  [40 %-50 %] 40 % (08/31 0819) Weight:  [92.4 kg] 92.4 kg (08/31 0500) Last BM Date: 07/01/2020  Intake/Output from previous day: 08/30 0701 - 08/31 0700 In: 2632.7 [I.V.:1189.6; NG/GT:1195; IV Piggyback:248.1] Out: 6644 [Urine:4025; Stool:900; Blood:10] Intake/Output this shift: No intake/output data recorded.  PE: General: sedated on the vent  Heart: regular, rate, and rhythm Lungs: ventilator, rales bilaterally  Abd: soft, NT, ND, +BS GU: Wound with minimal bleeding and purulence, necrotic appearing tissue present and bone visible; anteriorly wound appears more clean.     Lab Results:  Recent Labs    06/23/2020 1000 07/21/20 0406  WBC 28.8* 34.4*  HGB 7.1* 7.1*  HCT 25.1* 24.0*  PLT 209 262   BMET Recent Labs    07/13/2020 1000 07/21/20 0406  NA 140 142  K 5.1 4.4  CL 110 113*  CO2 22 21*  GLUCOSE 212* 196*  BUN 82* 87*  CREATININE 1.38* 1.53*  CALCIUM 7.5* 7.2*   PT/INR Recent Labs    07/19/20 0628  LABPROT 14.7  INR 1.2   CMP     Component Value Date/Time   NA 142 07/21/2020 0406   NA 130 (L) 02/28/2020 1552   K 4.4 07/21/2020 0406   CL 113 (H) 07/21/2020 0406   CO2 21 (L) 07/21/2020 0406   GLUCOSE 196 (H) 07/21/2020 0406   BUN 87 (H) 07/21/2020 0406   BUN 13 02/28/2020 1552   CREATININE 1.53 (H) 07/21/2020 0406   CREATININE 0.88 04/11/2013 0000   CALCIUM 7.2 (L) 07/21/2020 0406   PROT 4.6 (L) 07/12/2020 1000   PROT 6.6 08/08/2018 1126   ALBUMIN 1.0 (L) 07/12/2020 1000   ALBUMIN 4.4 08/08/2018 1126   AST 34 06/29/2020 1000   ALT 51 (H) 07/07/2020 1000    ALKPHOS 184 (H) 06/25/2020 1000   BILITOT 0.4 07/13/2020 1000   BILITOT <0.2 08/08/2018 1126   GFRNONAA 46 (L) 07/21/2020 0406   GFRNONAA >89 04/11/2013 0000   GFRAA 53 (L) 07/21/2020 0406   GFRAA >89 04/11/2013 0000   Lipase  No results found for: LIPASE     Studies/Results: DG CHEST PORT 1 VIEW  Result Date: 06/23/2020 CLINICAL DATA:  Ventilator dependence. EXAM: PORTABLE CHEST 1 VIEW COMPARISON:  Multiple recent previous exams. FINDINGS: 0449 hours. Endotracheal tube tip is 5.2 cm above the base of the carina. The NG tube passes into the stomach although the distal tip position is not included on the film. Right PICC line tip overlies distal SVC. Low lung volumes with stable asymmetric elevation right hemidiaphragm. Bibasilar collapse/consolidative opacity with probable bilateral pleural effusions. Vascular congestion again noted. The visualized bony structures of the thorax show now acute abnormality. Telemetry leads overlie the chest. IMPRESSION: No substantial interval change in exam. Bibasilar collapse/consolidative opacity with bilateral pleural effusions. Cardiomegaly with vascular congestion. Electronically Signed   By: Misty Stanley M.D.   On: 06/21/2020 07:41    Anti-infectives: Anti-infectives (From admission, onward)   Start     Dose/Rate Route Frequency Ordered Stop   06/30/2020 2131  vancomycin variable dose per unstable renal function (  pharmacist dosing)         Does not apply See admin instructions 07/19/2020 2131     07/11/2020 1800  vancomycin (VANCOCIN) IVPB 1000 mg/200 mL premix  Status:  Discontinued        1,000 mg 200 mL/hr over 60 Minutes Intravenous Every 12 hours 07/19/20 1446 07/09/2020 0823   07/05/2020 1000  clindamycin (CLEOCIN) IVPB 900 mg       "And" Linked Group Details   900 mg 100 mL/hr over 30 Minutes Intravenous 60 min pre-op 07/19/20 0854 06/30/2020 1537   06/30/2020 1000  gentamicin (GARAMYCIN) 460 mg in dextrose 5 % 100 mL IVPB       "And" Linked Group  Details   5 mg/kg  91.9 kg 111.5 mL/hr over 60 Minutes Intravenous 60 min pre-op 07/19/20 0854 07/13/2020 1900   06/21/2020 1000  vancomycin (VANCOCIN) IVPB 1000 mg/200 mL premix  Status:  Discontinued        1,000 mg 200 mL/hr over 60 Minutes Intravenous Every 12 hours 07/16/2020 0823 06/28/2020 2018   07/19/20 1400  neomycin (MYCIFRADIN) tablet 1,000 mg       "And" Linked Group Details   1,000 mg Oral 3 times per day 07/19/20 0854 07/19/20 2335   07/19/20 1400  metroNIDAZOLE (FLAGYL) tablet 1,000 mg       "And" Linked Group Details   1,000 mg Oral 3 times per day 07/19/20 0854 07/19/20 2336   07/17/20 1600  cefTRIAXone (ROCEPHIN) 2 g in sodium chloride 0.9 % 100 mL IVPB        2 g 200 mL/hr over 30 Minutes Intravenous Every 24 hours 07/17/20 1528     07/17/20 1400  vancomycin (VANCOCIN) IVPB 1000 mg/200 mL premix  Status:  Discontinued        1,000 mg 200 mL/hr over 60 Minutes Intravenous Every 8 hours 07/17/20 0944 07/19/20 1446   07/01/2020 1600  ceFEPIme (MAXIPIME) 2 g in sodium chloride 0.9 % 100 mL IVPB  Status:  Discontinued        2 g 200 mL/hr over 30 Minutes Intravenous Every 8 hours 06/26/2020 1537 07/17/20 1528   07/04/2020 1500  clindamycin (CLEOCIN) IVPB 600 mg        600 mg 100 mL/hr over 30 Minutes Intravenous Every 6 hours 07/04/2020 1337 07/18/20 0553   06/25/2020 0600  vancomycin (VANCOREADY) IVPB 750 mg/150 mL  Status:  Discontinued        750 mg 150 mL/hr over 60 Minutes Intravenous Every 8 hours 07/02/2020 2310 07/17/20 0944   06/27/2020 0200  metroNIDAZOLE (FLAGYL) IVPB 500 mg  Status:  Discontinued        500 mg 100 mL/hr over 60 Minutes Intravenous Every 8 hours 07/11/2020 2220 07/09/2020 1337   07/14/2020 2315  ceFEPIme (MAXIPIME) 2 g in sodium chloride 0.9 % 100 mL IVPB  Status:  Discontinued        2 g 200 mL/hr over 30 Minutes Intravenous Every 8 hours 07/07/2020 2310 07/11/2020 1537   07/11/2020 2000  vancomycin (VANCOREADY) IVPB 1500 mg/300 mL        1,500 mg 150 mL/hr over 120  Minutes Intravenous  Once 07/11/2020 1834 06/24/2020 2326   07/10/2020 1800  vancomycin (VANCOCIN) IVPB 1000 mg/200 mL premix  Status:  Discontinued        1,000 mg 200 mL/hr over 60 Minutes Intravenous  Once 07/08/2020 1757 07/19/2020 1834   07/21/2020 1800  clindamycin (CLEOCIN) IVPB 600 mg  600 mg 100 mL/hr over 30 Minutes Intravenous  Once 06/30/2020 1759 07/14/2020 1913   07/21/2020 1800  ciprofloxacin (CIPRO) IVPB 400 mg        400 mg 200 mL/hr over 60 Minutes Intravenous  Once 07/03/2020 1759 07/21/2020 2039       Assessment/Plan Sepsis with necrotizing fasciitis/hypotension Ventilator dependent respiratory failure Anemia COPD Type 2 diabetes with neuropathy Hypertension COVID-19 exposure - family member tested positive, patient tested negative, off precautions   Right AKA 7/30-Dr. Sharol Given Right above-the-knee amputation incision infection S/p debridement by Dr. Constance Haw 07/12/2020 - management per Dr. 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 Constance Haw 07/18/2020 S/p debridement by Dr. Rosendo Gros and Dr. Diona Fanti 07/08/2020 S/p debridement by Dr. Rosendo Gros 07/21/2020 S/p debridement and laparoscopic diverting loop colostomy 07/17/2020 Dr. Kieth Brightly - dressing changed at bedside this AM, BID saline WTD - start hydro therapy  - cx from ischial wound with strep agalactiae - WBC now up to 34 - continue to off-load pressure as able  FEN: TF, IVF, NPO [id number] on vanc and rocephin  DVT:SCD's  LOS: 9 days    Norm Parcel , [facility] 07/21/2020, 10:01 AM Please see Amion for pager number during day hours 7:00am-4:30pm

## 2020-07-21 NOTE — Progress Notes (Signed)
Pharmacy Antibiotic Note  Omar Todd is a 69 y.o. male admitted on 06/30/2020 with sepsis, large sacral decubitus ulcer, necrotizing fasciitis.  Patient is s/p OR debridement 8/23 and second look and I&D 8/25, third look with I&D on 8/26.  Pharmacy has been consulted for Vancomycin dosing.  Cefepime/Clindamycin was narrowed to Ceftriaxone.  Day #9 abx - Tm 100.5 - WBC 34.4 - AKI, SCr up 1.53 - vanc random level 23 ~24hr after most recent dose  Plan: - Continue Ceftriaxone 2g IV q24h per MD (appears missed dose 8/30) - Continue to hold vancomycin - consider changing MRSA coverage to Zyvox due to worsening renal function - Will f/u renal function, micro data, and pt's clinical condition   Height: 6\' 2"  (188 cm) Weight: 92.4 kg (203 lb 11.3 oz) IBW/kg (Calculated) : 82.2  Temp (24hrs), Avg:99.5 F (37.5 C), Min:98.4 F (36.9 C), Max:100.5 F (38.1 C)  Recent Labs  Lab 07/17/20 0457 07/17/20 0457 07/18/20 0352 07/19/20 0628 07/19/20 1202 07/19/20 1210 07/05/2020 1000 07/21/20 0406 07/21/20 1139  WBC 16.0*  --  18.1* 23.7*  --   --  28.8* 34.4*  --   CREATININE 0.55*   < > 0.46* 1.08 0.95  --  1.38* 1.53*  --   LATICACIDVEN 1.3  --   --   --   --  1.5  --   --   --   VANCOTROUGH 13*  --   --  26*  --   --   --   --   --   VANCORANDOM  --   --   --   --   --   --   --   --  23   < > = values in this interval not displayed.    Estimated Creatinine Clearance: 53 mL/min (A) (by C-G formula based on SCr of 1.53 mg/dL (H)).    Allergies  Allergen Reactions  . Penicillins Other (See Comments)    Pt. States he "passed out"    Antimicrobials this admission:  8/22 Cipro x 1 8/22 Clinda x 1, resume 8/23 >>8/28 8/22 Vancomycin >> 8/23 Metronidazole >> 8/23 8/23 Cefepime >> 8/27 8/27 Ceftriaxone >>   Dose adjustments this admission:  8/27 VT at 05:30 = 13 on 750mg  q8h --> increase to 1g q8h 8/29 VT at 05:30 = 26 on 1g IV q8h --> decrease to 1g IV q12h 8/31 Vanc random at  11:39 = 23 approx 24hr post dose --> continue to hold  Microbiology results:  8/22 COVID: neg 8/22 BCx: NGF 8/22 UCx:  multiple species (F) 8/22 Wound from ulcer cxs: multiple organisms, no staph, no group A strep 8/23 Sacrum abscess: multiple organisms, no staph, no group A strep (F) 8/23 Ischial wound: Strep agalactiae 8/23 AKA stump: rare MRSA, rare GBS (F) 8/23 Inguinal wound: rare Klebsiella oxytoca (pan-sens except ampicillin), rare strep constellatus (sens: levo, vanc)  (F) 8/24 MRSA PCR: positive 8/25 Respiratory panel (RSV/Flu/COVID): all neg 8/29 Respiratory cxt: flu neg, covid neg  Thank you for allowing pharmacy to be a part of this patient's care.  Peggyann Juba, PharmD, BCPS [facility] main pharmacy [phone] 07/21/2020 1:23 PM

## 2020-07-21 NOTE — Progress Notes (Signed)
Nutrition Follow-up  DOCUMENTATION CODES:   Not applicable  INTERVENTION:  - continue Vital AF 1.2 @ 50 with 10 packets Prosource TF/day and 1 packet juven BID.  - vitamin A supplementation to begin once patient extubated and safe to swallow pills.  - free water flush, if desired, to be per CCM or CCS.   NUTRITION DIAGNOSIS:   Increased nutrient needs related to acute illness as evidenced by estimated needs. -ongoing  GOAL:   Patient will meet greater than or equal to 90% of their needs -met with TF regimen  MONITOR:   Vent status, TF tolerance, Labs, Weight trends, Skin  ASSESSMENT:   69 year-old male with medical history of ongoing smoking, DM, COPD, hyperlipidemia, and GERD. 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 He underwent revision on 7/30. He was discharged from the hospital to rehab and left there AMA. He was admitted to [facility] on 8/22 was sepsis, SIRS, and noted to have stage 3 wound to R buttocks with fecal contamination. CT pelvis was consistent with necrotizing fasciitis.  Significant Events: 8/22- admission 8/23- I&D of R buttocks, R inguinal region, and R scrotal area; remained intubated with OGT in place post-op 8/25- return to OR for I&D 8/26- return to OR for I&D 8/30- return to OR for I&D; lap diverting loop colostomy     Patient remains intubated with with OGT in place. He is receiving Vital AF 1.2 @ 50 ml/hr with 90 ml Prosource TF x5/day (10 packets/day), and 1 packet juven BID. This regimen provides 2030 kcal, 205 grams protein, and 973 ml free water.   Weight continues to trend up and flow sheet documentation indicates deep pitting edema to BUE and very deep pitting edema to LLE. Used admission weight (78 kg) to re-estimate kcal need.   Vitamin A and vitamin D labs came back on 8/26. Vitamin D WDL. Vitamin A is low at 5.9. Talked with Pharmacist. Based on vitamin A supplementation  available at [facility], will need to wait until patient is extubated and determined to be safe for PO medications to order supplementation.   Patient discussed in rounds this AM. Plan to start hydrotherapy. No indication at this time about plan for return to OR.     Patient is currently intubated on ventilator support MV: 11.5 L/min Temp (24hrs), Avg:99.4 F (37.4 C), Min:98.4 F (36.9 C), Max:100.5 F (38.1 C) Propofol: none  Labs reviewed; CBGs: 184 and 190 mg/dl, Cl: 113 mmol/l, BUN: 87 mg/dl, creatinine: 1.53 mg/dl, Ca: 7.2 mg/dl, Phos: 5.2 mg/dl, GFR: 46 ml/min. Medications reviewed; 500 mcg ascorbic acid BID, 300 mg ferrous sulfate/day, 80 mg IV lasix TID, sliding scale novolog, 2 units novolog every 4 hours, 40 mg IV protonix/day, 100 mg thiamine/day, 220 mg zinc sulfate/day.  Drip; fentanyl @ 300 mcg/hr.    Diet Order:   Diet Order    None      EDUCATION NEEDS:   Not appropriate for education at this time  Skin:  Skin Assessment: Skin Integrity Issues: Skin Integrity Issues:: Unstageable, Incisions, Stage I Stage I: L buttocks Unstageable: full thickness to R buttocks and sacrum Incisions: perineum (8/26); sacrum (8/30)  Last BM:  8/31 (type 7 via colostomy)  Height:   Ht Readings from Last 1 Encounters:  06/24/2020 '6\' 2"'  (1.88 m)    Weight:   Wt Readings from Last 1 Encounters:  07/21/20 92.4 kg     Estimated  Nutritional Needs:  Kcal:  2062 kcal Protein:  177-202 grams (2.2-2.5 grams/kg) Fluid:  >/= 2.5 L/day     Jarome Matin, MS, RD, LDN, CNSC Inpatient Clinical Dietitian RD pager # available in AMION  After hours/weekend pager # available in [facility]

## 2020-07-22 ENCOUNTER — Ambulatory Visit: Payer: Medicare Other | Admitting: Family

## 2020-07-22 DIAGNOSIS — E1159 Type 2 diabetes mellitus with other circulatory complications: Secondary | ICD-10-CM

## 2020-07-22 DIAGNOSIS — I10 Essential (primary) hypertension: Secondary | ICD-10-CM

## 2020-07-22 LAB — CBC
HCT: 26 % — ABNORMAL LOW (ref 39.0–52.0)
Hemoglobin: 7.7 g/dL — ABNORMAL LOW (ref 13.0–17.0)
MCH: 25.1 pg — ABNORMAL LOW (ref 26.0–34.0)
MCHC: 29.6 g/dL — ABNORMAL LOW (ref 30.0–36.0)
MCV: 84.7 fL (ref 80.0–100.0)
Platelets: 374 10*3/uL (ref 150–400)
RBC: 3.07 MIL/uL — ABNORMAL LOW (ref 4.22–5.81)
RDW: 21.1 % — ABNORMAL HIGH (ref 11.5–15.5)
WBC: 40.4 10*3/uL — ABNORMAL HIGH (ref 4.0–10.5)
nRBC: 0.1 % (ref 0.0–0.2)

## 2020-07-22 LAB — GLUCOSE, CAPILLARY
Glucose-Capillary: 148 mg/dL — ABNORMAL HIGH (ref 70–99)
Glucose-Capillary: 185 mg/dL — ABNORMAL HIGH (ref 70–99)
Glucose-Capillary: 199 mg/dL — ABNORMAL HIGH (ref 70–99)
Glucose-Capillary: 274 mg/dL — ABNORMAL HIGH (ref 70–99)
Glucose-Capillary: 234 mg/dL — ABNORMAL HIGH (ref 70–99)

## 2020-07-22 LAB — BASIC METABOLIC PANEL
Anion gap: 14 (ref 5–15)
BUN: 113 mg/dL — ABNORMAL HIGH (ref 8–23)
CO2: 20 mmol/L — ABNORMAL LOW (ref 22–32)
Calcium: 7.6 mg/dL — ABNORMAL LOW (ref 8.9–10.3)
Chloride: 111 mmol/L (ref 98–111)
Creatinine, Ser: 1.74 mg/dL — ABNORMAL HIGH (ref 0.61–1.24)
GFR calc Af Amer: 45 mL/min — ABNORMAL LOW (ref >60)
GFR calc non Af Amer: 39 mL/min — ABNORMAL LOW (ref >60)
Glucose, Bld: 192 mg/dL — ABNORMAL HIGH (ref 70–99)
Potassium: 3.5 mmol/L (ref 3.5–5.1)
Sodium: 145 mmol/L (ref 135–145)

## 2020-07-22 LAB — TRIGLYCERIDES: Triglycerides: 57 mg/dL (ref <150)

## 2020-07-22 LAB — PHOSPHORUS: Phosphorus: 6 mg/dL — ABNORMAL HIGH (ref 2.5–4.6)

## 2020-07-22 LAB — CULTURE, RESPIRATORY W GRAM STAIN

## 2020-07-22 MED ORDER — INSULIN GLARGINE 100 UNIT/ML ~~LOC~~ SOLN
10.0000 [IU] | Freq: Two times a day (BID) | SUBCUTANEOUS | Status: DC
  Administered 2020-07-22 – 2020-07-23 (×2): 10 [IU] via SUBCUTANEOUS
  Filled 2020-07-22 (×2): qty 0.1

## 2020-07-22 MED ORDER — SODIUM CHLORIDE 0.9 % IV SOLN
100.0000 mg | INTRAVENOUS | Status: DC
  Administered 2020-07-23: 100 mg via INTRAVENOUS
  Filled 2020-07-22: qty 100

## 2020-07-22 MED ORDER — SODIUM CHLORIDE 0.9 % IV SOLN
200.0000 mg | INTRAVENOUS | Status: DC
  Administered 2020-07-22: 200 mg via INTRAVENOUS
  Filled 2020-07-22 (×2): qty 200

## 2020-07-22 NOTE — Progress Notes (Signed)
   07/22/20 1100 Hydrotherapy evaluation [id number]    Subjective on VENT- ETT  Patient and Family Stated Goals unable, no family  Date of Onset  (pressure area present, S/P I and D)  Prior Treatments unsure  Evaluation and Treatment  Evaluation and Treatment Procedures Explained to Patient/Family No  Evaluation and Treatment Procedures Patient unable to consent due to mental status  Wound / Incision (Open or Dehisced) 07/22/20 Incision - Open Rectum Right PT ONLY: large post I and D open wound from guteal to scrtum, dark slough and tan drainage   Date First Assessed/Time First Assessed: 07/22/20 1044   Wound Type: Incision - Open  Location: Rectum  Location Orientation: Right  Wound Description (Comments): PT ONLY: large post I and D open wound from guteal to scrtum, dark slough and tan draina...  Dressing Type ABD;Gauze (Comment);Moist to moist  Dressing Changed New  Dressing Status Clean;Dry  Dressing Change Frequency Twice a day  Site / Wound Assessment Yellow;Brown  % Wound base Red or Granulating 0%  % Wound base Yellow/Fibrinous Exudate 75%  % Wound base Black/Eschar 25%  % Wound base Other/Granulation Tissue (Comment)  (left ischium palpable)  Peri-wound Assessment Excoriated;Maceration  Wound Length (cm) 25 cm (difficult to measure entirity, open from sacrum to scrotum)  Wound Width (cm) 15 cm  Wound Depth (cm) 3 cm  Wound Volume (cm^3) 1125 cm^3  Wound Surface Area (cm^2) 375 cm^2  Margins Unattached edges (unapproximated)  Drainage Amount Copious  Drainage Description Odor;Purulent;Sanguineous  Non-staged Wound Description Full thickness  Treatment Hydrotherapy (Pulse lavage);Packing (Saline gauze)  Hydrotherapy  Pulsed Lavage with Suction (psi) 4 psi  Pulsed Lavage with Suction - Normal Saline Used 1000 mL  Pulsed Lavage Tip Tip with splash shield  Wound Therapy - Assess/Plan/Recommendations  Wound Therapy - Clinical Statement Omar Todd is  on full vent support  via ETT,. S/P diverting  colostomy, Tube feedings. Patient has H/O R AKA 7/21. Has been home followed by [facility], found  to  be febrile, septic . Now S/P I and D of large area from sacrum and involving scrotum. Fibrinous tissue on sacral area wound. Yellow slough in lower scrotal area.  Patient requires multiple personnel to position and monito for  VS while in sidelying. PT Hydrotherapy  recommended to promote wound cleansing and  wound debridement  to minimize effects of infection.   Wound Therapy - Functional Problem List on Vent, RAKA  Factors Delaying/Impairing Wound Healing Incontinence;Infection - systemic/local;Immobility;Multiple medical problems  Hydrotherapy Plan Debridement;Dressing change;Patient/family education;Pulsatile lavage with suction  Wound Therapy - Frequency 6X / week  Wound Therapy - Follow Up Recommendations  (LTACH)  Wound Plan hydrotherapy , dressing changes  Wound Therapy Goals - Improve the function of patient's integumentary system by progressing the wound(s) through the phases of wound healing by:  Decrease Necrotic Tissue to 50  Decrease Necrotic Tissue - Progress Goal set today  Increase Granulation Tissue to 50  Increase Granulation Tissue - Progress Goal set today  Goals/treatment plan/discharge plan were made with and agreed upon by patient/family No, Patient unable to participate in goals/treatment/discharge plan and family unavailable  Time For Goal Achievement 2 weeks  Wound Therapy - Potential for Goals Poor  Omar Todd Pager [phone] Office [phone]

## 2020-07-22 NOTE — Progress Notes (Signed)
[facility] Progress Note  2 Days Post-Op  Subjective: Seen with PT hydrotherapy. Pt sedated on the vent and remains critically ill.   Objective: Vital signs in last 24 hours: Temp:  [98.4 F (36.9 C)-101 F (38.3 C)] 100.1 F (37.8 C) (09/01 0800) Pulse Rate:  [59-107] 106 (09/01 0800) Resp:  [18-33] 22 (09/01 0800) BP: (120-180)/(47-74) 166/74 (09/01 0800) SpO2:  [97 %-100 %] 98 % (09/01 0800) FiO2 (%):  [30 %-40 %] 30 % (09/01 0800) Last BM Date: (P) 07/22/20  Intake/Output from previous day: 08/31 0701 - 09/01 0700 In: 1824.4 [I.V.:834.4; NG/GT:990] Out: 4000 [Urine:3790; Stool:210] Intake/Output this shift: Total I/O In: -  Out: 1350 [Urine:1350]  PE: General:sedated on the vent  GU: Wound with minimal bleeding and purulence, necrotic appearing tissue present and bone visible; anteriorly wound appears more clean.    Lab Results:  Recent Labs    07/21/20 0406 07/22/20 0836  WBC 34.4* 40.4*  HGB 7.1* 7.7*  HCT 24.0* 26.0*  PLT 262 374   BMET Recent Labs    07/12/2020 1000 07/21/20 0406  NA 140 142  K 5.1 4.4  CL 110 113*  CO2 22 21*  GLUCOSE 212* 196*  BUN 82* 87*  CREATININE 1.38* 1.53*  CALCIUM 7.5* 7.2*   PT/INR No results for input(s): LABPROT, INR in the last 72 hours. CMP     Component Value Date/Time   NA 142 07/21/2020 0406   NA 130 (L) 02/28/2020 1552   K 4.4 07/21/2020 0406   CL 113 (H) 07/21/2020 0406   CO2 21 (L) 07/21/2020 0406   GLUCOSE 196 (H) 07/21/2020 0406   BUN 87 (H) 07/21/2020 0406   BUN 13 02/28/2020 1552   CREATININE 1.53 (H) 07/21/2020 0406   CREATININE 0.88 04/11/2013 0000   CALCIUM 7.2 (L) 07/21/2020 0406   PROT 4.6 (L) 07/06/2020 1000   PROT 6.6 08/08/2018 1126   ALBUMIN 1.0 (L) 06/24/2020 1000   ALBUMIN 4.4 08/08/2018 1126   AST 34 06/29/2020 1000   ALT 51 (H) 07/19/2020 1000   ALKPHOS 184 (H) 07/09/2020 1000   BILITOT 0.4 06/27/2020 1000   BILITOT <0.2 08/08/2018 1126   GFRNONAA 46 (L)  07/21/2020 0406   GFRNONAA >89 04/11/2013 0000   GFRAA 53 (L) 07/21/2020 0406   GFRAA >89 04/11/2013 0000   Lipase  No results found for: LIPASE     Studies/Results: No results found.  Anti-infectives: Anti-infectives (From admission, onward)   Start     Dose/Rate Route Frequency Ordered Stop   07/21/20 2200  linezolid (ZYVOX) tablet 600 mg        600 mg Per Tube Every 12 hours 07/21/20 1341     07/17/2020 2131  vancomycin variable dose per unstable renal function (pharmacist dosing)  Status:  Discontinued         Does not apply See admin instructions 07/06/2020 2131 07/21/20 1340   06/22/2020 1800  vancomycin (VANCOCIN) IVPB 1000 mg/200 mL premix  Status:  Discontinued        1,000 mg 200 mL/hr over 60 Minutes Intravenous Every 12 hours 07/19/20 1446 07/01/2020 0823   07/19/2020 1000  clindamycin (CLEOCIN) IVPB 900 mg       "And" Linked Group Details   900 mg 100 mL/hr over 30 Minutes Intravenous 60 min pre-op 07/19/20 0854 07/16/2020 1537   06/30/2020 1000  gentamicin (GARAMYCIN) 460 mg in dextrose 5 % 100 mL IVPB       "And" Linked Group Details  5 mg/kg  91.9 kg 111.5 mL/hr over 60 Minutes Intravenous 60 min pre-op 07/19/20 0854 07/06/2020 1900   07/01/2020 1000  vancomycin (VANCOCIN) IVPB 1000 mg/200 mL premix  Status:  Discontinued        1,000 mg 200 mL/hr over 60 Minutes Intravenous Every 12 hours 07/17/2020 0823 07/19/2020 2018   07/19/20 1400  neomycin (MYCIFRADIN) tablet 1,000 mg       "And" Linked Group Details   1,000 mg Oral 3 times per day 07/19/20 0854 07/19/20 2335   07/19/20 1400  metroNIDAZOLE (FLAGYL) tablet 1,000 mg       "And" Linked Group Details   1,000 mg Oral 3 times per day 07/19/20 0854 07/19/20 2336   07/17/20 1600  cefTRIAXone (ROCEPHIN) 2 g in sodium chloride 0.9 % 100 mL IVPB        2 g 200 mL/hr over 30 Minutes Intravenous Every 24 hours 07/17/20 1528     07/17/20 1400  vancomycin (VANCOCIN) IVPB 1000 mg/200 mL premix  Status:  Discontinued        1,000  mg 200 mL/hr over 60 Minutes Intravenous Every 8 hours 07/17/20 0944 07/19/20 1446   07/01/2020 1600  ceFEPIme (MAXIPIME) 2 g in sodium chloride 0.9 % 100 mL IVPB  Status:  Discontinued        2 g 200 mL/hr over 30 Minutes Intravenous Every 8 hours 07/15/2020 1537 07/17/20 1528   06/21/2020 1500  clindamycin (CLEOCIN) IVPB 600 mg        600 mg 100 mL/hr over 30 Minutes Intravenous Every 6 hours 07/07/2020 1337 07/18/20 0553   07/09/2020 0600  vancomycin (VANCOREADY) IVPB 750 mg/150 mL  Status:  Discontinued        750 mg 150 mL/hr over 60 Minutes Intravenous Every 8 hours 07/09/2020 2310 07/17/20 0944   07/14/2020 0200  metroNIDAZOLE (FLAGYL) IVPB 500 mg  Status:  Discontinued        500 mg 100 mL/hr over 60 Minutes Intravenous Every 8 hours 06/25/2020 2220 06/23/2020 1337   07/10/2020 2315  ceFEPIme (MAXIPIME) 2 g in sodium chloride 0.9 % 100 mL IVPB  Status:  Discontinued        2 g 200 mL/hr over 30 Minutes Intravenous Every 8 hours 07/01/2020 2310 07/03/2020 1537   06/23/2020 2000  vancomycin (VANCOREADY) IVPB 1500 mg/300 mL        1,500 mg 150 mL/hr over 120 Minutes Intravenous  Once 07/09/2020 1834 06/24/2020 2326   07/06/2020 1800  vancomycin (VANCOCIN) IVPB 1000 mg/200 mL premix  Status:  Discontinued        1,000 mg 200 mL/hr over 60 Minutes Intravenous  Once 07/03/2020 1757 06/27/2020 1834   06/24/2020 1800  clindamycin (CLEOCIN) IVPB 600 mg        600 mg 100 mL/hr over 30 Minutes Intravenous  Once 07/14/2020 1759 07/11/2020 1913   07/02/2020 1800  ciprofloxacin (CIPRO) IVPB 400 mg        400 mg 200 mL/hr over 60 Minutes Intravenous  Once 07/09/2020 1759 06/22/2020 2039       Assessment/Plan Sepsis with necrotizing fasciitis/hypotension Ventilator dependent respiratory failure Anemia COPD Type 2 diabetes with neuropathy Hypertension COVID-19 exposure - family member tested positive, patient tested negative, off precautions   Right AKA 7/30-Dr. Sharol Given Right above-the-knee amputation incision infection S/p  debridement by Dr. Constance Haw 07/21/2020 - management per Dr. 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 Constance Haw 06/29/2020 S/p  debridement by Dr. Rosendo Gros and Dr. Diona Fanti 07/10/2020 S/p debridement by Dr. Rosendo Gros 07/02/2020 S/p debridement and laparoscopic diverting loop colostomy 07/01/2020 Dr. Kieth Brightly - continue hydrotherapy and BID wet to dry dressing changes with saline  - cx from ischial wound with strep agalactiae - WBC now up to 40  - continue to off-load pressure as able - will see again Friday   FEN:TF, IVF, NPO CX:[id number] on rocephin and linezolid; resp cx with moderate yeast - ?consider adding antifungal DVT:SCD's  LOS: 10 days    Norm Parcel , [facility] Surgery 07/22/2020, 9:12 AM Please see Amion for pager number during day hours 7:00am-4:30pm

## 2020-07-22 NOTE — Progress Notes (Signed)
NAME:  Omar Todd, MRN:  [id number], DOB:  10-09-1951, LOS: 69 ADMISSION DATE:  06/26/2020, CONSULTATION DATE:  07/14/20 REFERRING MD:  [name] trnasfer, CHIEF COMPLAINT:  [name] at [place] History    30 pack current smoker with DM, COPD Nos, BP, lipid, GERD. 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 At some pont also has stage 2 sacral decub "Size of a quarter"- described at time of this discharge. Discharged to ? Rehab and then signed out ? AMA from rehab.   Readimmited to [facility] 07/05/2020 with sepsis syndrome(fever + SIRS with lactate 2.1) with growing. Rt buttock ulcer noted to be stage 3 large wth fecal soiling but R AKA stump incision well healed but  By 07/17/2020 purulent drainage noted. CT pelvis c/w necrotizing fasciitis (with sub cut gas) in right perineal soft tissue and bedside exam was concerning for crepitus and concern for Fournier gangrene. Patient noticed to be disoriented by 06/24/2020  On 07/17/2020 Hervey Ard excisional debridement of right buttock, right inguinal region, and right scrotal skin measuring (right buttock / right inguinal wound 20cmX10cmX4cm and right scrotal skin 15cmX8cmX2cm); Drainage and packing of Right AKA wound infection  -. 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 Concern for need for repeated debridement and urology involvement and patient transferred to [facility] long from [facility] on 07/14/20. Upon arrival getting PRBC for hgb < 7gm% and sedatedh diprivan gtt and fent gtt on ventilator needing neo for bp support.    Decision maker:  he has a wife at [facility] with dementia and estranged children he has not seen since they were babies.Bakary Bramer [phone]  She is his  closest relative and decision maker. She does not have health care power of attorney.    Past Medical History     has a past medical history of COPD (chronic obstructive pulmonary disease) ([place]), Diabetes mellitus without complication ([place]), Gangrene of toe of right foot ([place]), GERD (gastroesophageal reflux disease), Hyperlipidemia, and Hypertension.   reports that he has been smoking cigarettes. He has a 30.00 pack-year smoking history. He has never used smokeless tobacco.   has a past surgical history that includes Thumb surgery; Esophagogastroduodenoscopy (N/A, 03/13/2020); biopsy (03/13/2020); Flexible sigmoidoscopy (N/A, 05/06/2020); Amputation (Right, 05/17/2020); Application if wound vac (Right, 05/17/2020); Amputation (Right, 06/19/2020); Incision and drainage of wound (Right, 06/24/2020); Incision and drainage (Right, 07/12/2020); Incision and drainage abscess (N/A, 06/22/2020); Wound debridement (N/A, 07/05/2020); laparoscopy (N/A, 07/04/2020); and Incision and drainage abscess (N/A, 07/04/2020).   Significant Hospital Events   07/10/2020 -  admit 07/14/20  - transfer to [facility] 8/25 - s. I and D and debridement of Rt Gluteal Nec Fasc by CCS and ->   Inspection of wound, debridement of right testicle/cord, scrotal wound edge ( excised tissue approximately 2 x 4 cm) by Urology. On neo gtt, diprivan gtt, fent gtt . On vent 40% . Not on TF (yet). Ortho Dr Sharol Given recmmended -> packing the  R AKA wound daily . Seen by Dr Gloriann Loan of Urology - currently R testicle exposed but viable tissue  8/26 = PRBC < 7 and 1 u prbc ordered. On vent. BAck to OR today, . Exposure to sister with  covid + . Sister Inez Catalina the DPOA reported that she is covid positive 8/.25/2 but she has not been at bedside. Later the visitor - sster. [name] is also covid positive. APteitn repeat covid - negative  8/27 - Followed commands on [facility] 8/27 am Remains on fent gtt at 125 mcg, diprivan gtt at 30 mcg and neo gtt at 75 mcg via PICC  line.  T max 101 WBC 16 HGB 7.6 Platelets 170,000 Lactate 1.3 Potassium 3.9 Mag 2 + 16 L since admission    8/28 -  +18L since admit. Looking more voume overloaded. Increased fio2 50% on vent. CXR wet. Unable to wean. On fent gtt, diprivan gtt, neo gtt and LR at 100cc/h  9/1 Sedate -Attempts to open his eyes to voice  Consults:  8/24 - pccm  Procedures:  8/23 -  On 07/02/2020 Hervey Ard excisional debridement of right buttock, right inguinal region, and right scrotal skin measuring (right buttock / right inguinal wound 20cmX10cmX4cm and right scrotal skin 15cmX8cmX2cm); Drainage and packing of Right AKA wound infection ETT 8/23  RUE picc 8/24  Significant Diagnostic Tests:  cxr with basal atelectasis  Micro Data:   Microbiology results: 8/22 BCx:  8/22 UCx:   8/22 Wound cx 8/23 - wound - multiple org. None predominant 8/23 wound inguinal -  KLEBSIELLA OXYTOCA     Organism ID, Bacteria STREPTOCOCCUS CONSTELLATUS    xxx 8/26 covid pcr - neg Covid negative  Antimicrobials:  Antimicrobials this admission: Cipro IV 8/22 >>8/22 Clinda 8/22 >8/22, 8/23 > 8/28 Vanc 8/22 >8/22, 8/22 >8/27, 8/27 > Cfeepime 8/22 >8/26, 8/26 > 8/27 Ceftriaxone 8/27 >> xxxx Clinda 8/30 preop Gentamicon 8/30 preop Flagyl 3 dose 8/30 preop Neomycin oral 8/29 3 doses preop  Interim history/subjective:   8/29 - stil volume overloaded at +19.9L since admit. 40% fio2, Pn fent gtt. Off diprivan and neo. On TF.  CCS plans for 8/30 -> diverting loop colostomy for fecal diversion given massive soft tissue infection and necrosis limited mobility with amputation 8/30-no overnight events, off pressors 9/1-no overnight events  Objective   Blood pressure (!) 166/74, pulse (!) 106, temperature 100.1 F (37.8 C), temperature source Axillary, resp. rate (!) 22, height 6\' 2"  (1.88 m), weight 92.4 kg, SpO2 98 %.    Vent Mode: PRVC FiO2 (%):  [30 %-40 %] 30 % Set Rate:  [15 bmp] 15 bmp Vt Set:  [650  mL] 650 mL PEEP:  [5 cmH20] 5 cmH20 Plateau Pressure:  [15 cmH20-19 cmH20] 15 cmH20   Intake/Output Summary (Last 24 hours) at 07/22/2020 0944 Last data filed at 07/22/2020 6546 Gross per 24 hour  Intake 1834.35 ml  Output 5350 ml  Net -3515.65 ml   Filed Weights   07/10/2020 0403 07/18/20 0500 07/21/20 0500  Weight: 84.5 kg 91.9 kg 92.4 kg   General Appearance: Critically ill-appearing, attempts to open eyes to voice Head: Normocephalic, atraumatic Eyes: Pupils equal and reactive Throat:   endotracheal tube in place Neck:  Supple, no JVD Lungs: Clear breath sounds bilaterally Heart: S1-S2 appreciated Abdomen: Soft, no organomegaly Skin: Warm and dry  Resolved Hospital Problem list   Circulatory shock 8/28  Assessment & Plan:   Acute hypoxemic respiratory failure -Continue ventilator support -Wean as tolerated -Not ready for extubation -Continue VAP bundle  Fournier's gangrene  Post debridement -Post diverting colostomy -Continue antibiotics-currently on Zyvox and ceftriaxone  Chronic obstructive pulmonary disease -Continue bronchodilators  Circulatory shock -Pressors to maintain systolic BP greater than 65 -Has been off  pressors  Acute kidney injury -Cautious diuresis -Trend electrolytes  Anemia -We will transfuse per protocol  We will attempt to decrease sedation medications today to see what he is able to do He appears to be weaning well this morning however mental status is still poor    Best practice:  Diet: Tube feeds Pain/Anxiety/Delirium protocol (if indicated): sedation protocol VAP protocol (if indicated): Bundle in place DVT prophylaxis: Lovenox/Heparin GI prophylaxis: ppi Glucose control: ssi Mobility: bed rest Code Status: full code  Family Communication: Sister Byard Carranza main decision makder  [phone]   updated 07/18/20  Over phone. - she had questions about why being asked to set up password as new process. Offered to refer to  office of patient experience. However, she wanted bedside RN to give her  A call  8/29 - updated sister Roma Bierlein over phone   Disposition: [facility]  The patient is critically ill with multiple organ systems failure and requires high complexity decision making for assessment and support, frequent evaluation and titration of therapies, application of advanced monitoring technologies and extensive interpretation of multiple databases. Critical Care Time devoted to patient care services described in this note independent of APP/resident time (if applicable)  is 30 minutes.   Sherrilyn Rist MD [facility] Personal pager: [phone] If unanswered, please page CCM On-call: [phone]

## 2020-07-22 NOTE — TOC Progression Note (Signed)
Transition of Care [facility]) - Progression Note    Patient Details  Name: DAEMON DOWTY MRN: [id number] Date of Birth: [birth year]  Transition of Care [facility]) CM/SW Contact  Leeroy Cha, RN Phone Number: 07/22/2020, 8:05 AM  Clinical Narrative:    Diverting loop colostomy done on 07/02/2020, has a rt, decubitus to the buttocks area, rt aka, iv rocephin, tube feeds, iv sedation, wbc=34.4 hgb 7.1 this am.hx of copd (smoker), etoh abuse. Plan is for most likely for Ltach given condition and vent.        Expected Discharge Plan and Services                                                 Social Determinants of Health (SDOH) Interventions    Readmission Risk Interventions Readmission Risk Prevention Plan 05/18/2020  Transportation Screening Complete  PCP or Specialist Appt within 5-7 Days Not Complete  Not Complete comments plan for SNF  PCP or Specialist Appt within 3-5 Days Not Complete  Not Complete comments plan for SNF  Home Care Screening Complete  Medication Review (RN CM) Referral to Pharmacy  HRI or [place] Complete  Social Work Consult for [place] Planning/Counseling Complete  Palliative Care Screening Not Complete  Palliative Care Screening Not Complete Comments could be appropriate  Medication Review (RN Transport planner) Referral to Pharmacy  Some recent data might be hidden

## 2020-07-22 NOTE — Progress Notes (Signed)
Rectal tube still putting out small amount of stool. Monitoring output from colostomy w/ plan to d/c rectal tube at EOS per Claiborne Bill[place], [place]

## 2020-07-22 NOTE — Progress Notes (Signed)
eraxis ordered

## 2020-07-22 DEATH — deceased

## 2020-07-23 ENCOUNTER — Inpatient Hospital Stay (HOSPITAL_COMMUNITY): Payer: Medicare Other

## 2020-07-23 DIAGNOSIS — T8149XA Infection following a procedure, other surgical site, initial encounter: Secondary | ICD-10-CM

## 2020-07-23 DIAGNOSIS — L89310 Pressure ulcer of right buttock, unstageable: Secondary | ICD-10-CM

## 2020-07-23 LAB — BLOOD GAS, ARTERIAL
Acid-base deficit: 2.8 mmol/L — ABNORMAL HIGH (ref 0.0–2.0)
Acid-base deficit: 0.6 mmol/L (ref 0.0–2.0)
Bicarbonate: 23.8 mmol/L (ref 20.0–28.0)
Bicarbonate: 22.9 mmol/L (ref 20.0–28.0)
Drawn by: 29503
FIO2: 30
FIO2: 30
MECHVT: 650 mL
O2 Saturation: 86.4 %
O2 Saturation: 88.4 %
PEEP: 5 cmH2O
Patient temperature: 99.9
Patient temperature: 98.6
RATE: 30 resp/min
pCO2 arterial: 55.3 mmHg — ABNORMAL HIGH (ref 32.0–48.0)
pCO2 arterial: 35.3 mmHg (ref 32.0–48.0)
pH, Arterial: 7.261 — ABNORMAL LOW (ref 7.350–7.450)
pH, Arterial: 7.428 (ref 7.350–7.450)
pO2, Arterial: 68 mmHg — ABNORMAL LOW (ref 83.0–108.0)
pO2, Arterial: 60.2 mmHg — ABNORMAL LOW (ref 83.0–108.0)

## 2020-07-23 LAB — PHOSPHORUS: Phosphorus: 5.8 mg/dL — ABNORMAL HIGH (ref 2.5–4.6)

## 2020-07-23 LAB — CBC
HCT: 26 % — ABNORMAL LOW (ref 39.0–52.0)
Hemoglobin: 7.5 g/dL — ABNORMAL LOW (ref 13.0–17.0)
MCH: 24.8 pg — ABNORMAL LOW (ref 26.0–34.0)
MCHC: 28.8 g/dL — ABNORMAL LOW (ref 30.0–36.0)
MCV: 85.8 fL (ref 80.0–100.0)
Platelets: 415 10*3/uL — ABNORMAL HIGH (ref 150–400)
RBC: 3.03 MIL/uL — ABNORMAL LOW (ref 4.22–5.81)
RDW: 21 % — ABNORMAL HIGH (ref 11.5–15.5)
WBC: 31.8 10*3/uL — ABNORMAL HIGH (ref 4.0–10.5)
nRBC: 0 % (ref 0.0–0.2)

## 2020-07-23 LAB — GLUCOSE, CAPILLARY
Glucose-Capillary: 246 mg/dL — ABNORMAL HIGH (ref 70–99)
Glucose-Capillary: 253 mg/dL — ABNORMAL HIGH (ref 70–99)
Glucose-Capillary: 292 mg/dL — ABNORMAL HIGH (ref 70–99)
Glucose-Capillary: 294 mg/dL — ABNORMAL HIGH (ref 70–99)
Glucose-Capillary: 355 mg/dL — ABNORMAL HIGH (ref 70–99)
Glucose-Capillary: 378 mg/dL — ABNORMAL HIGH (ref 70–99)
Glucose-Capillary: 380 mg/dL — ABNORMAL HIGH (ref 70–99)

## 2020-07-23 LAB — BASIC METABOLIC PANEL
Anion gap: 11 (ref 5–15)
BUN: 137 mg/dL — ABNORMAL HIGH (ref 8–23)
CO2: 24 mmol/L (ref 22–32)
Calcium: 7.8 mg/dL — ABNORMAL LOW (ref 8.9–10.3)
Chloride: 114 mmol/L — ABNORMAL HIGH (ref 98–111)
Creatinine, Ser: 1.64 mg/dL — ABNORMAL HIGH (ref 0.61–1.24)
GFR calc Af Amer: 49 mL/min — ABNORMAL LOW (ref >60)
GFR calc non Af Amer: 42 mL/min — ABNORMAL LOW (ref >60)
Glucose, Bld: 426 mg/dL — ABNORMAL HIGH (ref 70–99)
Potassium: 3.2 mmol/L — ABNORMAL LOW (ref 3.5–5.1)
Sodium: 149 mmol/L — ABNORMAL HIGH (ref 135–145)

## 2020-07-23 LAB — HEMOGLOBIN A1C
Hgb A1c MFr Bld: 7.2 % — ABNORMAL HIGH (ref 4.8–5.6)
Mean Plasma Glucose: 159.94 mg/dL

## 2020-07-23 IMAGING — CT CT HEAD W/O CM
2 series · 15 of 37 positions shown, 18 images · non-contrast
Comparison: None.

CLINICAL DATA: Mental status change of unknown cause.

EXAM:
CT HEAD WITHOUT CONTRAST
TECHNIQUE: Contiguous axial images were obtained from the base of the skull
through the vertex without intravenous contrast.

[Series 2: head wo · axial · 0.43mm/px · z∈[+1452,+1597]mm · 12 of 35 slices shown, 15 images]
[im 3/35  brain]
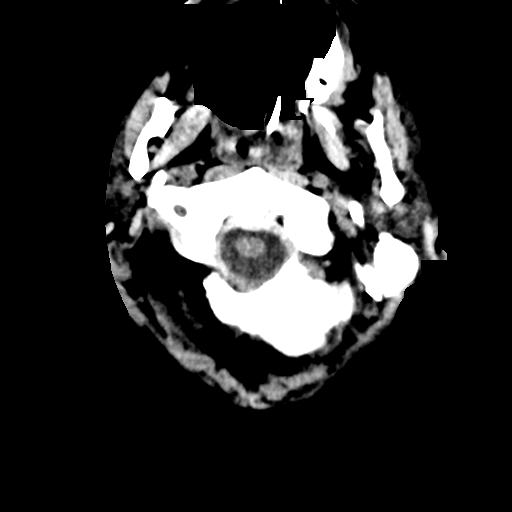
[im 3/35  bone]
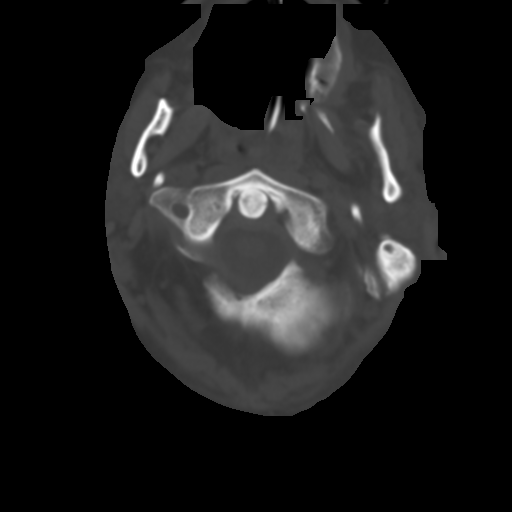
[im 5/35  brain]
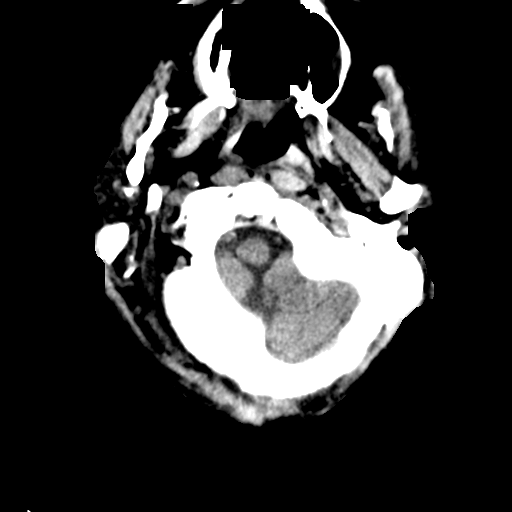
[im 8/35  brain]
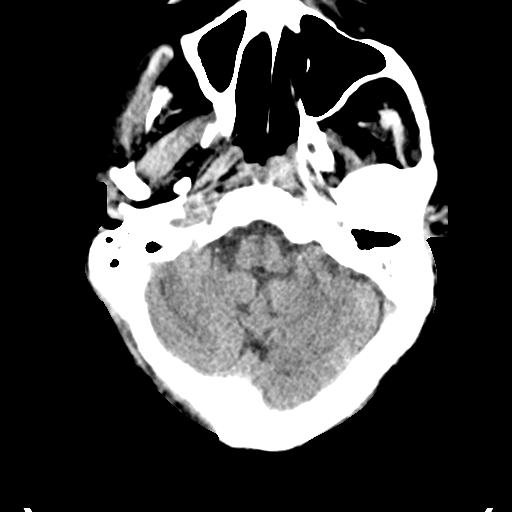
[im 11/35  brain]
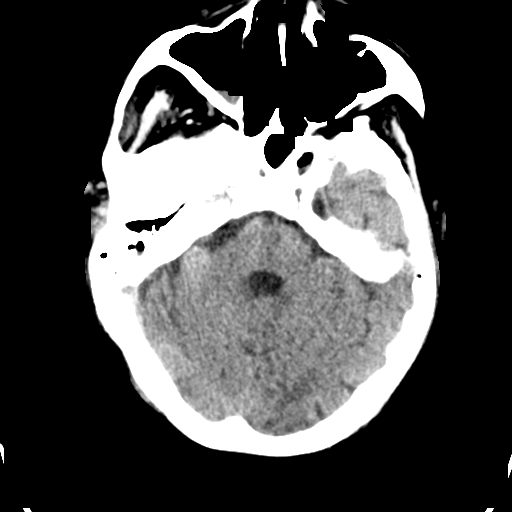
[im 13/35  brain]
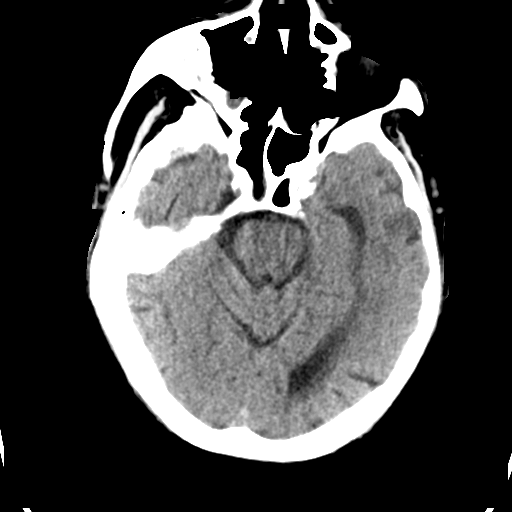
[im 13/35  bone]
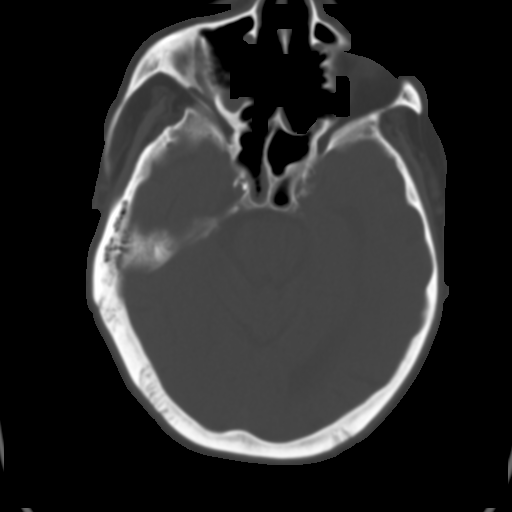
[im 16/35  brain]
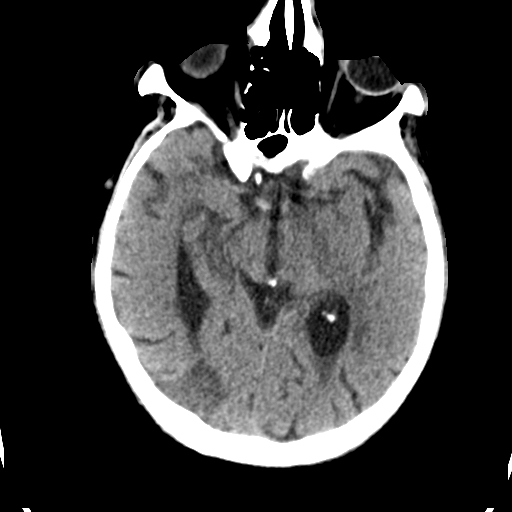
[im 19/35  brain]
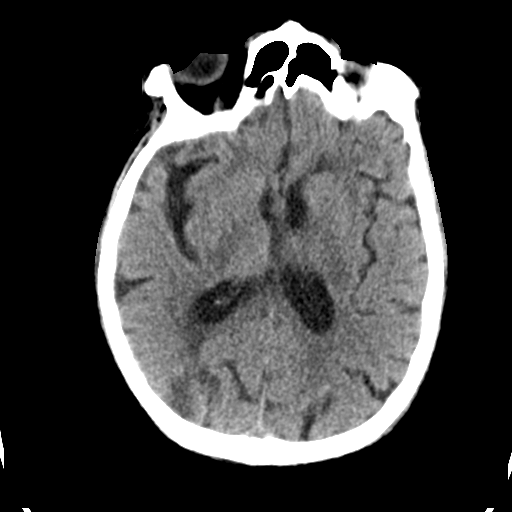
[im 22/35  brain]
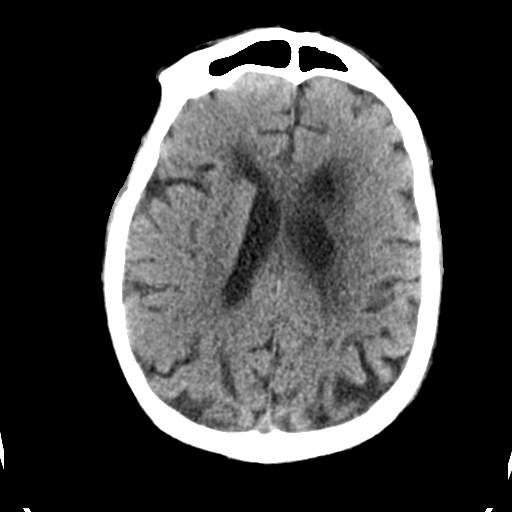
[im 24/35  brain]
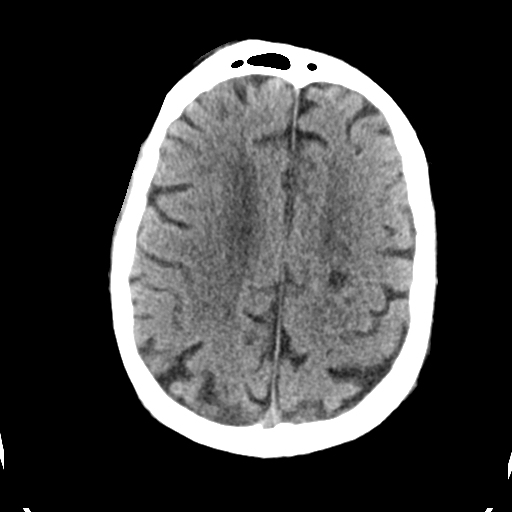
[im 24/35  bone]
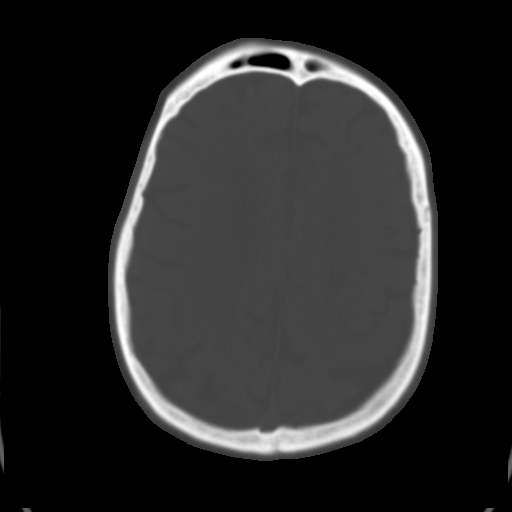
[im 27/35  brain]
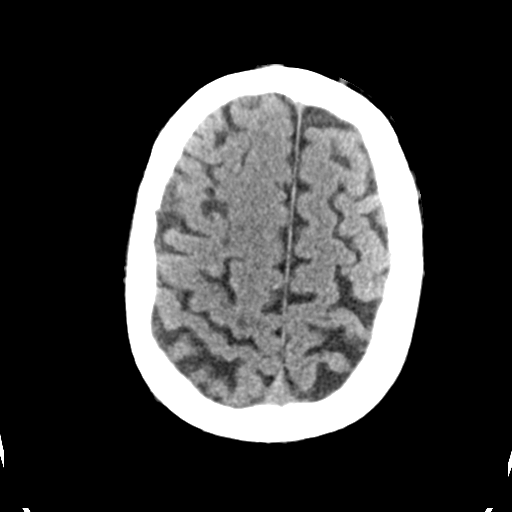
[im 30/35  brain]
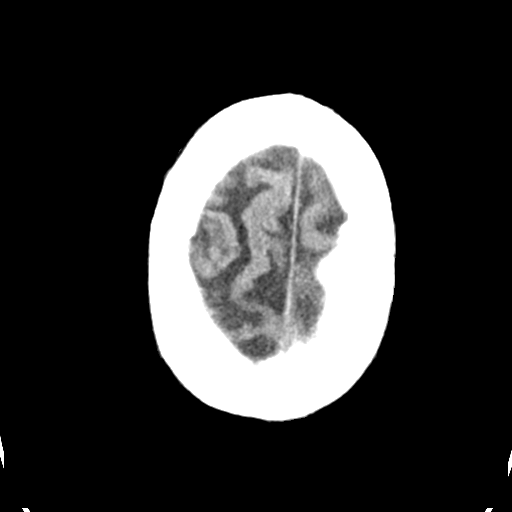
[im 32/35  brain]
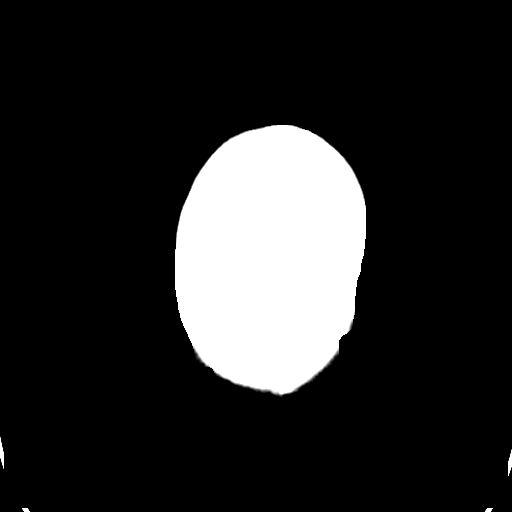

[Series 5: sagittal soft tissue · sagittal · 0.33mm/px · 3 of 55 slices shown]
[im 20/55  brain]
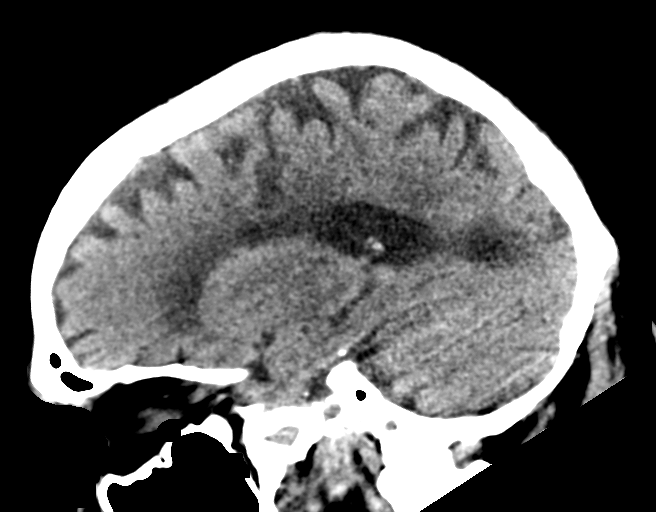
[im 28/55  brain]
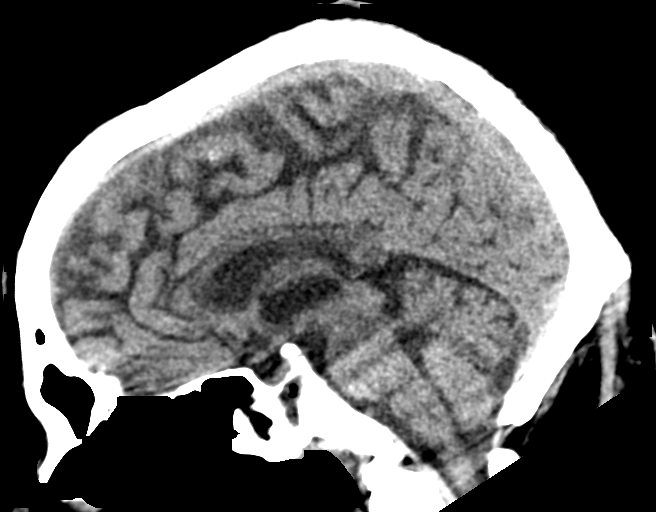
[im 35/55  brain]
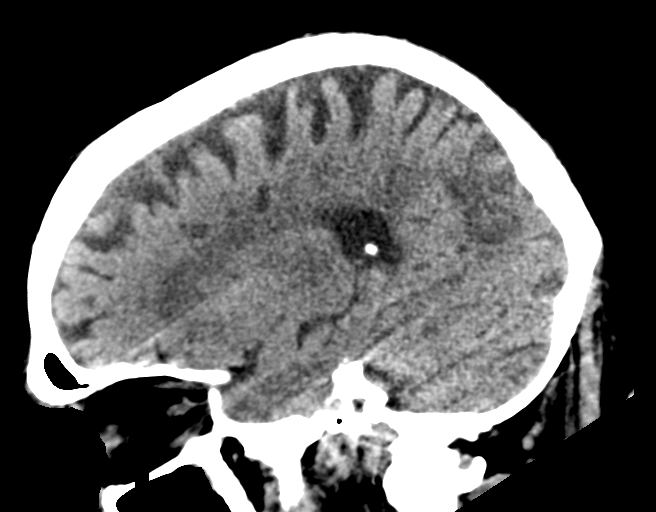

[15 of 37 positions shown; findings below may reference images not displayed]

FINDINGS: Brain: Wedge-shaped area hypoattenuation is noted in the posterior
right parietal lobe bordering on the right occipital lobe, involving
the gray-white junction, consistent with a recent infarct.

Smaller areas of more well-defined hypoattenuation white matter,
which are likely due to chronic lacunar infarcts.

The ventricles are normal in configuration. Mild ventricular and
sulcal enlargement reflecting age-appropriate volume loss. Bilateral
periventricular white matter hypoattenuation is present consistent
with mild chronic microvascular ischemic change.

There are no parenchymal masses or significant mass effect. No
midline shift. There are no extra-axial masses or abnormal fluid
collections.

There is no intracranial hemorrhage.

Vascular: No hyperdense vessel or unexpected calcification.

Skull: Normal. Negative for fracture or focal lesion.

Sinuses/Orbits: Globes and orbits are unremarkable. Small amount of
dependent fluid in the right maxillary sinus. Mild sphenoid sinus
mucosal thickening. Fluid attenuation noted mastoid air cells partly
filling the middle ear cavities in this intubated patient.

Other: None.
IMPRESSION: 1. Recent infarct of the posterior right parietal lobe bordering on
the right occipital lobe, approximately 3 cm in greatest dimension.
This appears subacute.
2. No other convincing recent infarcts. Probable all white matter
lacunar infarcts and mild chronic microvascular ischemic change.
3. No intracranial hemorrhage.  No hydrocephalus.

## 2020-07-23 IMAGING — MR MR HEAD WO/W CM
11 of 13 series · 35 of 48 positions shown · IV contrast (gadavist)
Comparison: None.

CLINICAL DATA: Stroke follow-up

EXAM:
MRI HEAD WITHOUT AND WITH CONTRAST
TECHNIQUE: Multiplanar, multiecho pulse sequences of the brain and surrounding
structures were obtained without and with intravenous contrast.
CONTRAST:  10mL GADAVIST GADOBUTROL 1 MMOL/ML IV SOLN

[Series 7: GRE · axial · 4.0mm · 0.45mm/px · z∈[+7,+156]mm · 4 of 42 slices shown]
[im 1/42]
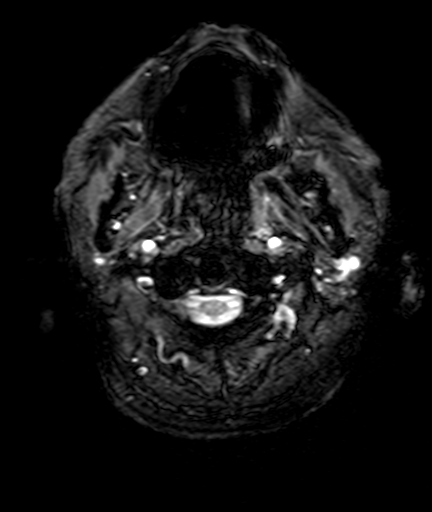
[im 14/42]
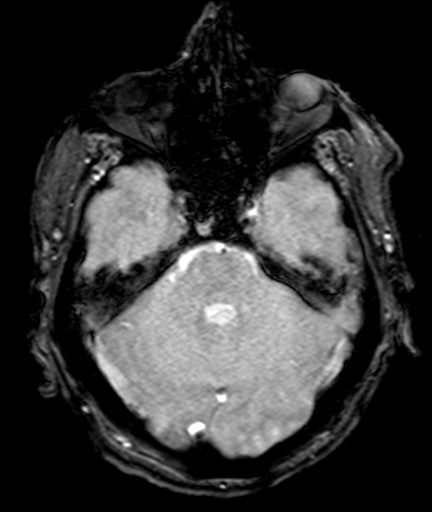
[im 28/42]
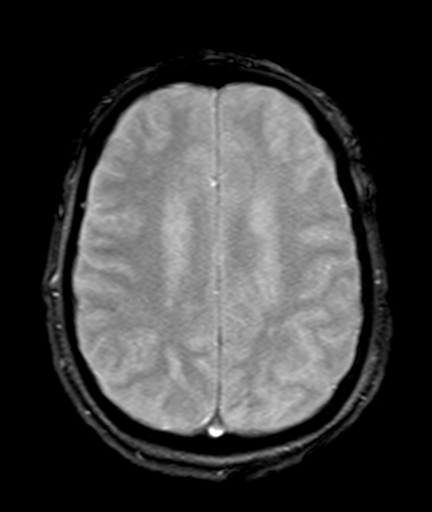
[im 42/42]
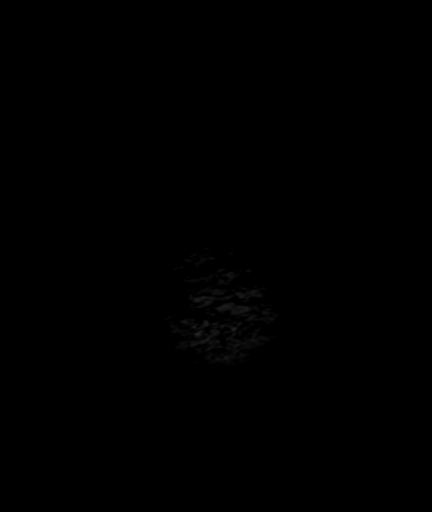

[Series 8: FLAIR · axial · 4.0mm · 0.86mm/px · z∈[+5,+153]mm · 4 of 42 slices shown]
[im 1/42]
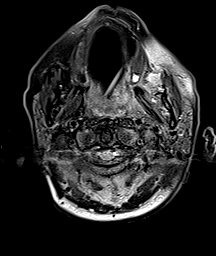
[im 14/42]
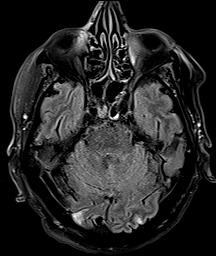
[im 28/42]
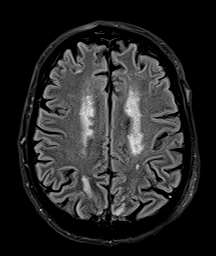
[im 42/42]
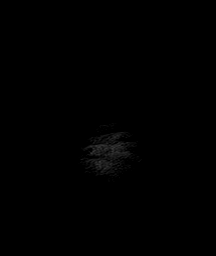

[Series 9: T2 · sagittal · 5.0mm · 0.47mm/px · 2 of 26 slices shown (1 of 2)]
[im 1/26]
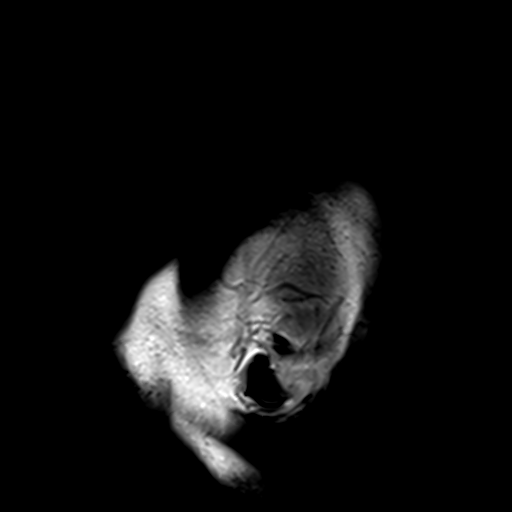
[im 26/26]
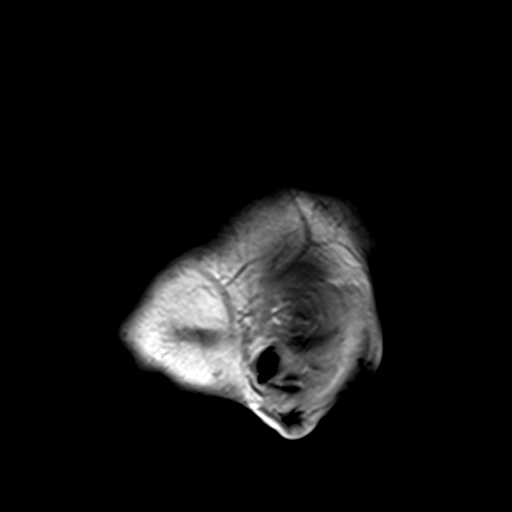

[Series 10: T2 · axial · 5.0mm · 0.45mm/px · z∈[+9,+156]mm · 2 of 26 slices shown (2 of 2)]
[im 1/26]
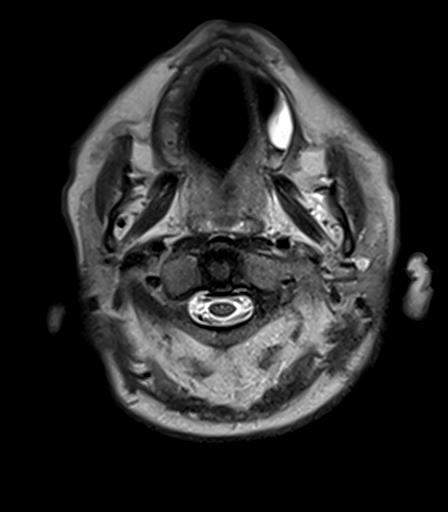
[im 26/26]
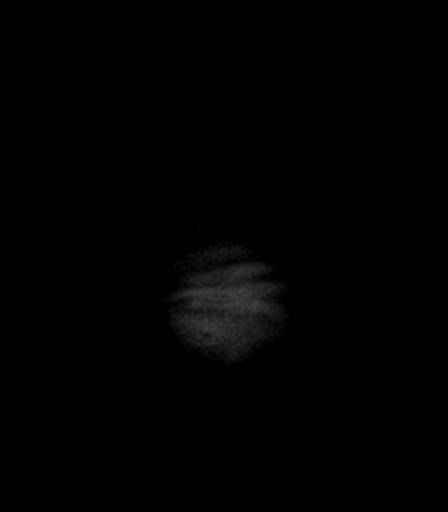

[Series 11: T1 · axial · 4.0mm · 0.45mm/px · z∈[+6,+104]mm · 3 of 42 slices shown]
[im 1/42]
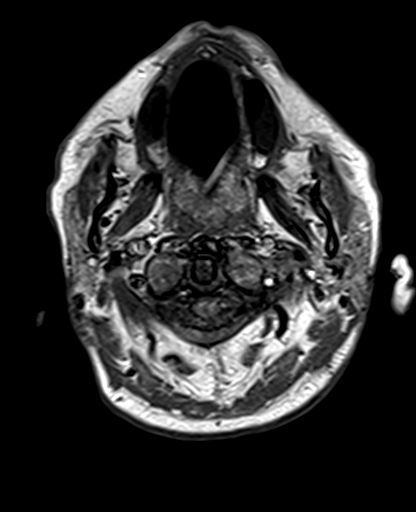
[im 14/42]
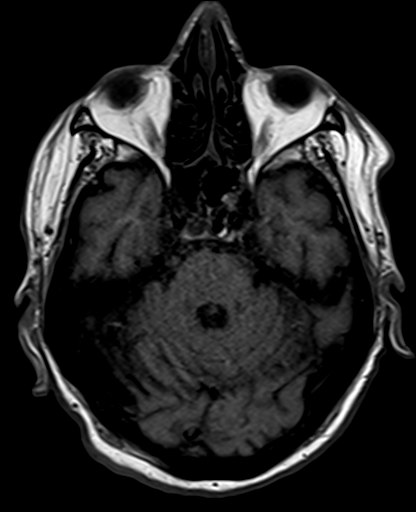
[im 28/42]
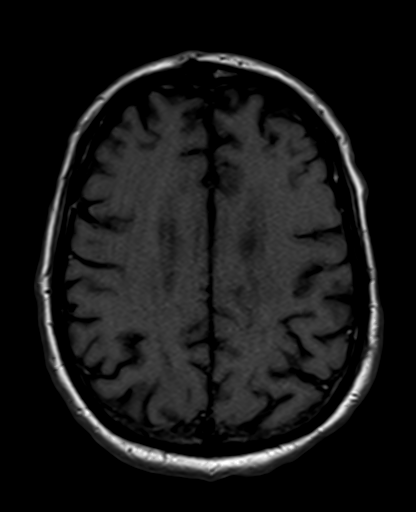

[Series 12: DWI · coronal · 5.0mm · 1.31mm/px · 5 of 60 slices shown (1 of 2)]
[im 1/60]
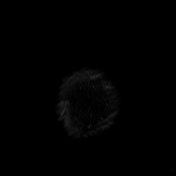
[im 15/60]
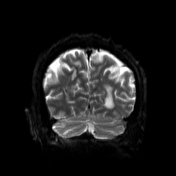
[im 30/60]
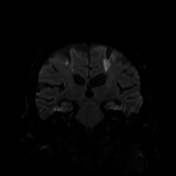
[im 45/60]
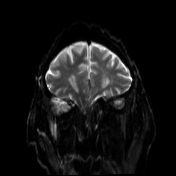
[im 60/60]
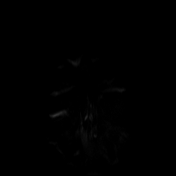

[Series 13: DWI · coronal · 5.0mm · 1.31mm/px · 3 of 30 slices shown (2 of 2)]
[im 1/30]
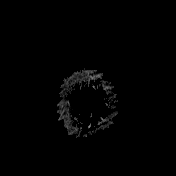
[im 15/30]
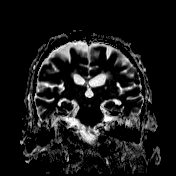
[im 30/30]
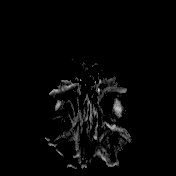

[Series 14: T2 post-contrast · coronal · 5.0mm · 0.86mm/px · 3 of 30 slices shown]
[im 1/30]
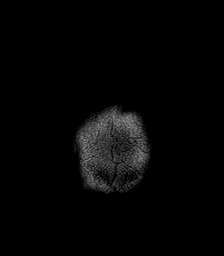
[im 15/30]
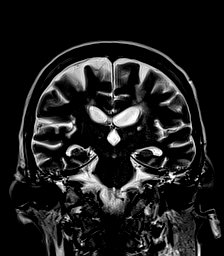
[im 30/30]
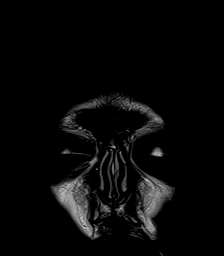

[Series 15: T1 post-contrast · axial · 4.0mm · 0.45mm/px · z∈[+6,+155]mm · 4 of 42 slices shown (1 of 3)]
[im 1/42]
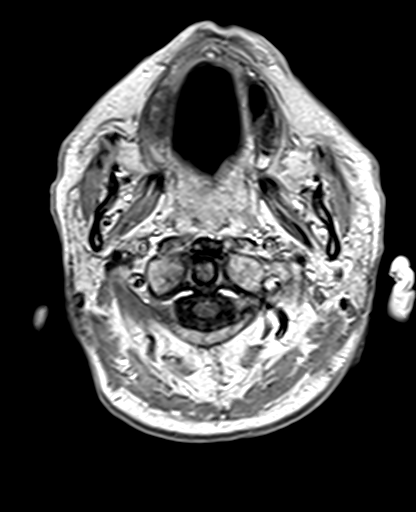
[im 14/42]
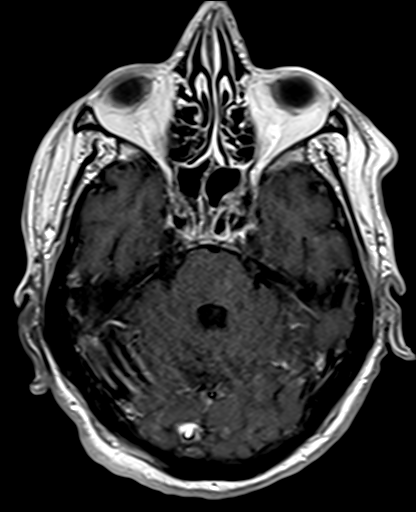
[im 28/42]
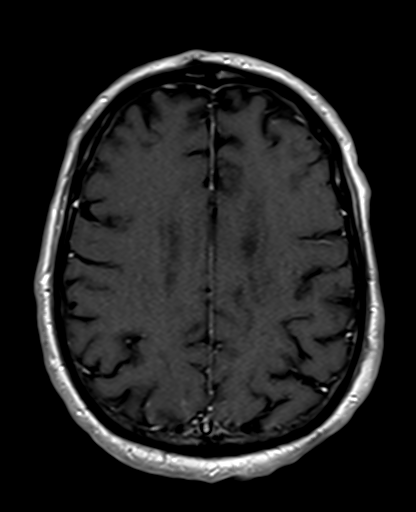
[im 42/42]
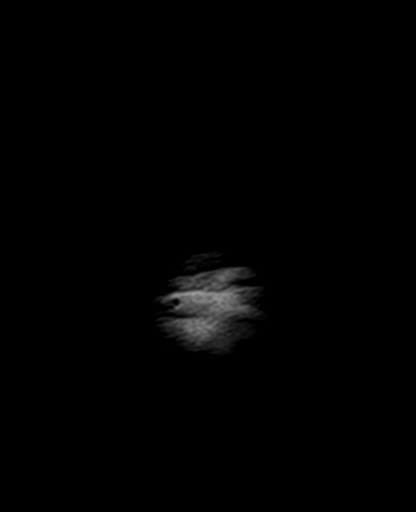

[Series 16: T1 post-contrast · coronal · 5.0mm · 0.43mm/px · 3 of 30 slices shown (2 of 3)]
[im 1/30]
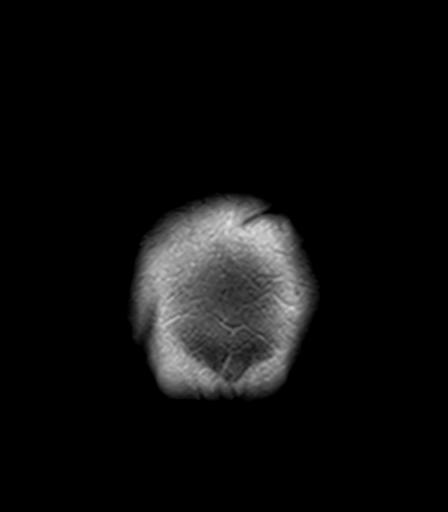
[im 15/30]
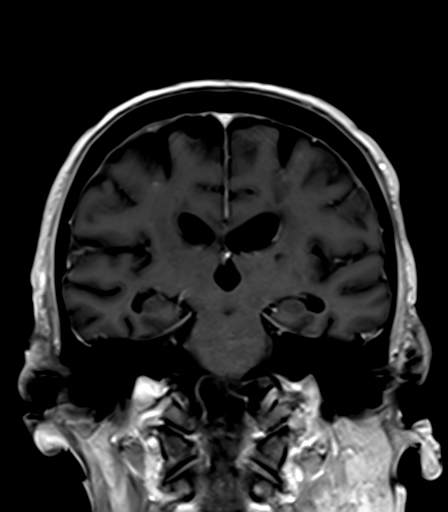
[im 30/30]
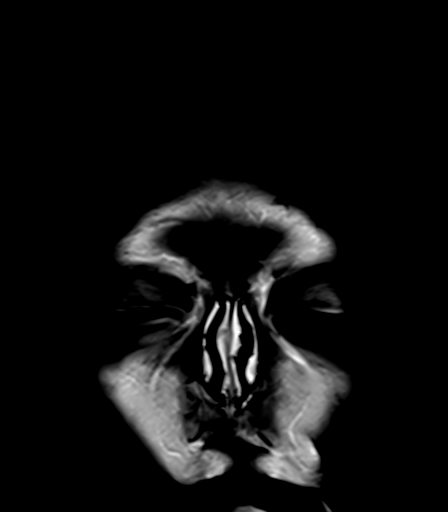

[Series 17: T1 post-contrast · sagittal · 5.0mm · 0.94mm/px · 2 of 26 slices shown (3 of 3)]
[im 1/26]
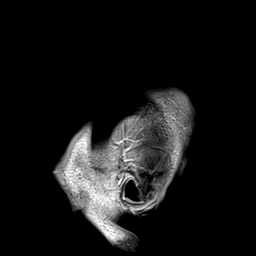
[im 26/26]
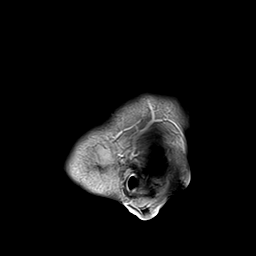

[35 of 48 positions shown; findings below may reference images not displayed]

FINDINGS: Brain: There are bilateral occipital lobe infarcts and numerous
infarcts within the deep watershed zones. There also numerous
bilateral cerebellar infarcts. Early confluent hyperintense
T2-weighted signal of the periventricular and deep white matter.
There is generalized atrophy without lobar predilection. No chronic
microhemorrhage. Normal midline structures. There are areas of
post-ischemic leptomeningeal contrast enhancement within the
watershed zones.

Vascular: Normal flow voids.

Skull and upper cervical spine: Normal marrow signal.

Sinuses/Orbits: Negative.

Other: None.
IMPRESSION: 1. Bilateral occipital lobe, deep watershed zone and cerebellar
infarcts. No acute hemorrhage or mass effect.
2. Generalized atrophy and chronic microvascular ischemia.

## 2020-07-23 IMAGING — DX DG CHEST 1V PORT
1 series · 1 of 1 positions shown · non-contrast
Comparison: Chest x-ray [DATE], chest x-ray [DATE]

CLINICAL DATA: Respiratory failure

EXAM:
PORTABLE CHEST 1 VIEW.  Patient is rotated.

[chest ap]
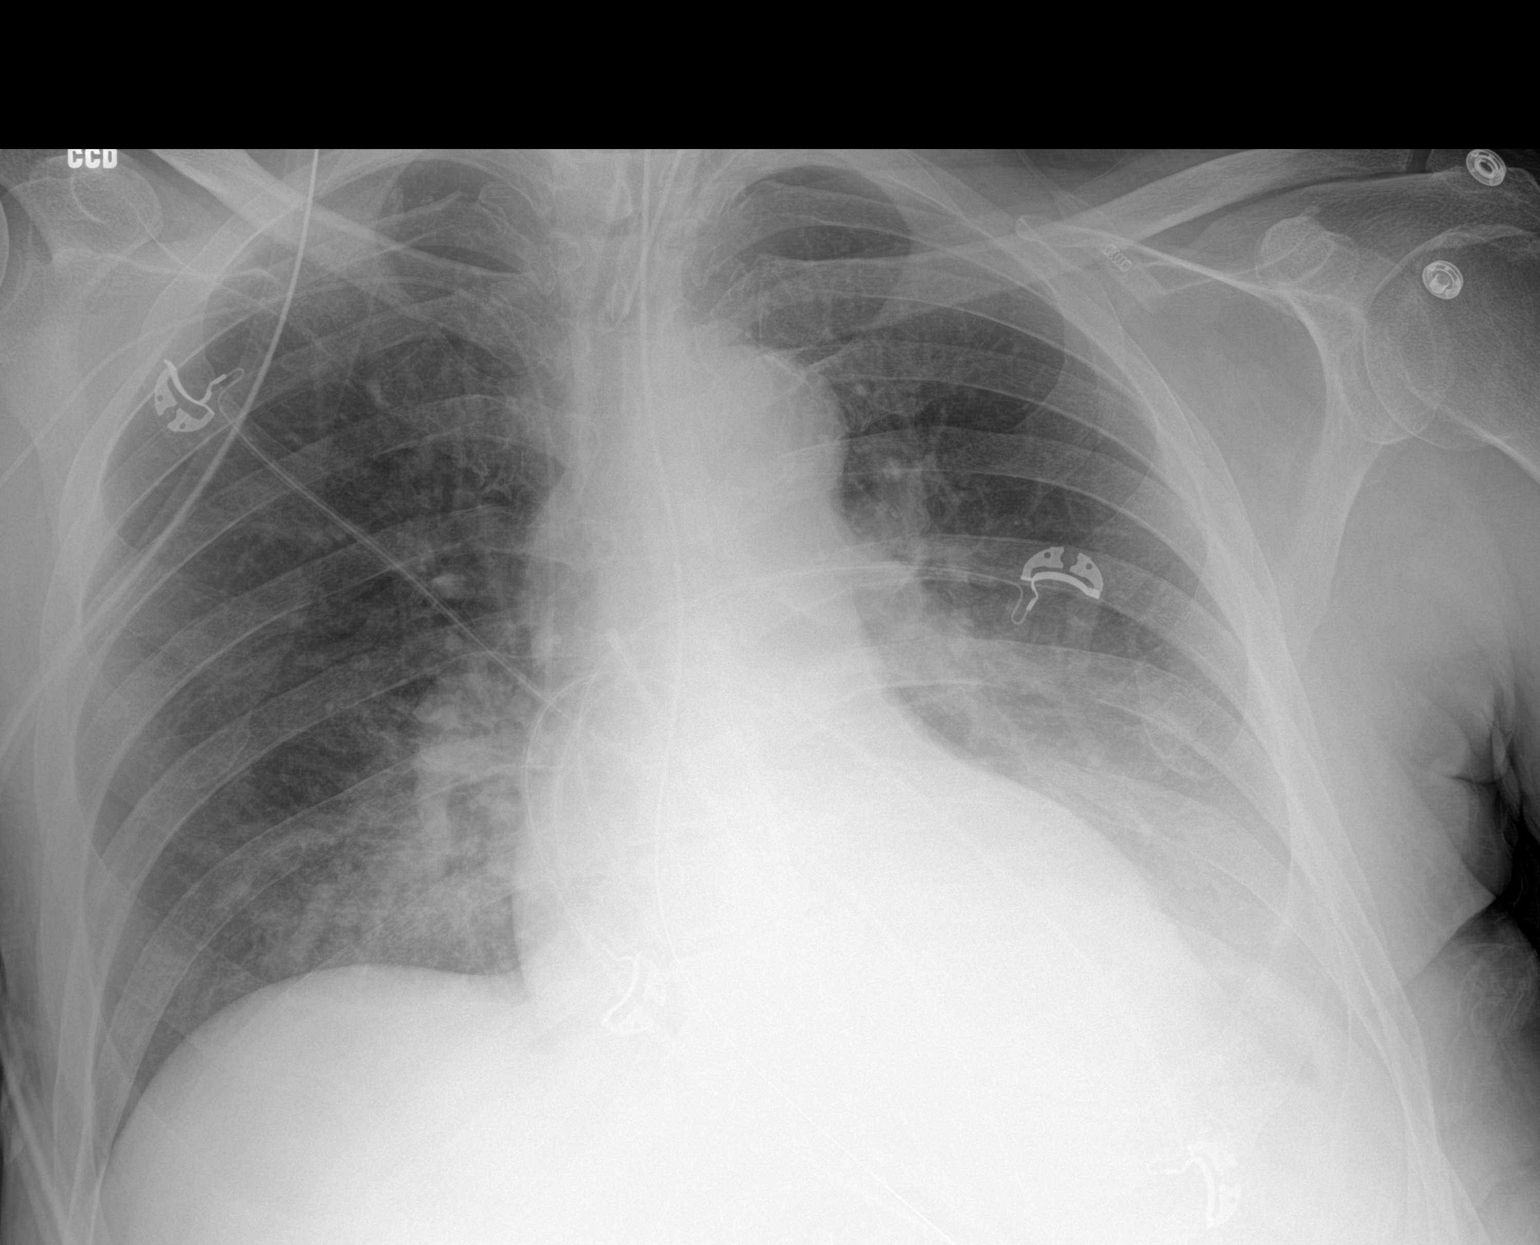

[1 of 1 positions shown; findings below may reference images not displayed]

FINDINGS: Endotracheal tube in similar position with tip terminating
approximately 6 cm above the carina. Enteric tube again noted
coursing below the diaphragm with tip and inside port collimated off
view. Right peripherally inserted central venous catheter again
noted with tip overlying the expected region of the superior
cavoatrial junction.

The heart size and mediastinal contours are within normal limits.
Redemonstration of aortic arch calcifications. No focal
consolidation or pulmonary edema. Persistent at least small volume
left pleural effusion. Interval improvement of possible residual
trace right pleural effusion. No pneumothorax.

The visualized skeletal structures are unremarkable.
IMPRESSION: Persistent small volume left pleural effusion.

Interval improvement of possible residual trace right pleural
effusion.

Otherwise no acute cardiopulmonary abnormality.

Lines and tubes in similar position.

## 2020-07-23 MED ORDER — FLUCONAZOLE 100 MG PO TABS: 200.0000 mg | ORAL_TABLET | Freq: Every day | ORAL | Status: DC

## 2020-07-23 MED ORDER — METRONIDAZOLE IN NACL 5-0.79 MG/ML-% IV SOLN
500.0000 mg | Freq: Three times a day (TID) | INTRAVENOUS | Status: DC
  Administered 2020-07-23 – 2020-07-26 (×11): 500 mg via INTRAVENOUS
  Filled 2020-07-23 (×11): qty 100

## 2020-07-23 MED ORDER — LABETALOL HCL 5 MG/ML IV SOLN
20.0000 mg | INTRAVENOUS | Status: DC | PRN
  Filled 2020-07-23: qty 4

## 2020-07-23 MED ORDER — GADOBUTROL 1 MMOL/ML IV SOLN
10.0000 mL | Freq: Once | INTRAVENOUS | Status: AC | PRN
  Administered 2020-07-23: 10 mL via INTRAVENOUS

## 2020-07-23 MED ORDER — AMLODIPINE BESYLATE 10 MG PO TABS
10.0000 mg | ORAL_TABLET | Freq: Every day | ORAL | Status: DC
  Administered 2020-07-23 – 2020-07-24 (×2): 10 mg via NASOGASTRIC
  Filled 2020-07-23 (×3): qty 1

## 2020-07-23 MED ORDER — INSULIN ASPART 100 UNIT/ML ~~LOC~~ SOLN
0.0000 [IU] | SUBCUTANEOUS | Status: DC
  Administered 2020-07-23 (×3): 15 [IU] via SUBCUTANEOUS
  Administered 2020-07-24: 5 [IU] via SUBCUTANEOUS
  Administered 2020-07-24 (×3): 3 [IU] via SUBCUTANEOUS
  Administered 2020-07-24: 8 [IU] via SUBCUTANEOUS
  Administered 2020-07-25: 3 [IU] via SUBCUTANEOUS
  Administered 2020-07-25 (×2): 5 [IU] via SUBCUTANEOUS
  Administered 2020-07-25: 3 [IU] via SUBCUTANEOUS
  Administered 2020-07-25: 5 [IU] via SUBCUTANEOUS
  Administered 2020-07-25: 3 [IU] via SUBCUTANEOUS
  Administered 2020-07-26: 2 [IU] via SUBCUTANEOUS
  Administered 2020-07-26: 5 [IU] via SUBCUTANEOUS
  Administered 2020-07-26: 2 [IU] via SUBCUTANEOUS

## 2020-07-23 MED ORDER — LABETALOL HCL 5 MG/ML IV SOLN
20.0000 mg | Freq: Once | INTRAVENOUS | Status: AC
  Administered 2020-07-23: 20 mg via INTRAVENOUS

## 2020-07-23 MED ORDER — FLUCONAZOLE IN SODIUM CHLORIDE 400-0.9 MG/200ML-% IV SOLN
800.0000 mg | Freq: Once | INTRAVENOUS | Status: AC
  Administered 2020-07-24: 400 mg via INTRAVENOUS
  Filled 2020-07-23: qty 400

## 2020-07-23 MED ORDER — POTASSIUM CHLORIDE CRYS ER 20 MEQ PO TBCR
40.0000 meq | EXTENDED_RELEASE_TABLET | Freq: Once | ORAL | Status: AC
  Administered 2020-07-23: 40 meq via ORAL
  Filled 2020-07-23: qty 2

## 2020-07-23 MED ORDER — INSULIN GLARGINE 100 UNIT/ML ~~LOC~~ SOLN
15.0000 [IU] | Freq: Two times a day (BID) | SUBCUTANEOUS | Status: DC
  Administered 2020-07-23 – 2020-07-24 (×3): 15 [IU] via SUBCUTANEOUS
  Filled 2020-07-23 (×4): qty 0.15

## 2020-07-23 MED ORDER — LABETALOL HCL 5 MG/ML IV SOLN: 10.0000 mg | INTRAVENOUS | Status: DC | PRN

## 2020-07-23 NOTE — Progress Notes (Signed)
Per nightshift RN report, pt responsive to pain @0400 . Upon brief neuro assessment during bedside report @0730 . Pt not responsive to pain. Pupils equal and sluggish. Sedation decreased. MD notified.

## 2020-07-23 NOTE — Progress Notes (Signed)
Assisted tele visit to patient with family member.  Shawn Dannenberg Anderson, RN   

## 2020-07-23 NOTE — Progress Notes (Signed)
NAME:  Omar Todd, MRN:  [id number], DOB:  01/23/1951, LOS: 76 ADMISSION DATE:  07/07/2020, CONSULTATION DATE:  07/14/20 REFERRING MD:  [name] trnasfer, CHIEF COMPLAINT:  [name] at [facility] History    30 pack current smoker with DM, COPD Nos, BP, lipid, GERD. 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 At some pont also has stage 2 sacral decub "Size of a quarter"- described at time of this discharge. Discharged to ? Rehab and then signed out ? AMA from rehab.   Readimmited to [facility] 07/10/2020 with sepsis syndrome(fever + SIRS with lactate 2.1) with growing. Rt buttock ulcer noted to be stage 3 large wth fecal soiling but R AKA stump incision well healed but  By 07/10/2020 purulent drainage noted. CT pelvis c/w necrotizing fasciitis (with sub cut gas) in right perineal soft tissue and bedside exam was concerning for crepitus and concern for Fournier gangrene. Patient noticed to be disoriented by 07/01/2020  On 06/21/2020 Hervey Ard excisional debridement of right buttock, right inguinal region, and right scrotal skin measuring (right buttock / right inguinal wound 20cmX10cmX4cm and right scrotal skin 15cmX8cmX2cm); Drainage and packing of Right AKA wound infection  -. 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 Concern for need for repeated debridement and urology involvement and patient transferred to [facility] from [facility] on 07/14/20. Upon arrival getting PRBC for hgb < 7gm% and sedatedh diprivan gtt and fent gtt on ventilator needing neo for bp support.    Decision maker:  he has a wife at [facility] with dementia and estranged children he has not seen since they were babies.Derry Kassel [phone]  She is his  closest relative and decision maker. She does not have health care power of attorney.    Past Medical History     has a past medical history of COPD (chronic obstructive pulmonary disease) ([place]), Diabetes mellitus without complication ([place]), Gangrene of toe of right foot ([place]), GERD (gastroesophageal reflux disease), Hyperlipidemia, and Hypertension.   reports that he has been smoking cigarettes. He has a 30.00 pack-year smoking history. He has never used smokeless tobacco.   has a past surgical history that includes Thumb surgery; Esophagogastroduodenoscopy (N/A, 03/13/2020); biopsy (03/13/2020); Flexible sigmoidoscopy (N/A, 05/06/2020); Amputation (Right, 05/17/2020); Application if wound vac (Right, 05/17/2020); Amputation (Right, 06/19/2020); Incision and drainage of wound (Right, 07/15/2020); Incision and drainage (Right, 07/13/2020); Incision and drainage abscess (N/A, 07/13/2020); Wound debridement (N/A, 07/16/2020); laparoscopy (N/A, 06/24/2020); and Incision and drainage abscess (N/A, 07/04/2020).   Significant Hospital Events   07/05/2020 - [facility] admit 07/14/20  - transfer to [facility] 8/25 - s. I and D and debridement of Rt Gluteal Nec Fasc by CCS and ->   Inspection of wound, debridement of right testicle/cord, scrotal wound edge ( excised tissue approximately 2 x 4 cm) by Urology. On neo gtt, diprivan gtt, fent gtt . On vent 40% . Not on TF (yet). Ortho Dr Sharol Given recmmended -> packing the  R AKA wound daily . Seen by Dr Gloriann Loan of Urology - currently R testicle exposed but viable tissue  8/26 = PRBC < 7 and 1 u prbc ordered. On vent. BAck to OR today, . Exposure to sister with  covid + . Sister Inez Catalina the DPOA reported that she is covid positive 8/.25/2 but she has not been at bedside. Later the visitor - sster. [name] is also covid positive. APteitn repeat covid - negative  8/27 - Followed commands on [facility] 8/27 am Remains on fent gtt at 125 mcg, diprivan gtt at 30 mcg and neo gtt at 75 mcg via PICC  line.  T max 101 WBC 16 HGB 7.6 Platelets 170,000 Lactate 1.3 Potassium 3.9 Mag 2 + 16 L since admission    8/28 -  +18L since admit. Looking more voume overloaded. Increased fio2 50% on vent. CXR wet. Unable to wean. On fent gtt, diprivan gtt, neo gtt and LR at 100cc/h  9/1 Sedate -Attempts to open his eyes to voice  Consults:  8/24 - pccm  Procedures:  8/23 -  On 07/10/2020 Hervey Ard excisional debridement of right buttock, right inguinal region, and right scrotal skin measuring (right buttock / right inguinal wound 20cmX10cmX4cm and right scrotal skin 15cmX8cmX2cm); Drainage and packing of Right AKA wound infection ETT 8/23  RUE picc 8/24  Significant Diagnostic Tests:  cxr with basal atelectasis  Micro Data:   Microbiology results: 8/22 BCx:  8/22 UCx:   8/22 Wound cx 8/23 - wound - multiple org. None predominant 8/23 wound inguinal -  KLEBSIELLA OXYTOCA     Organism ID, Bacteria STREPTOCOCCUS CONSTELLATUS    xxx 8/26 covid pcr - neg Covid negative  Antimicrobials:  Antimicrobials this admission: Cipro IV 8/22 >>8/22 Clinda 8/22 >8/22, 8/23 > 8/28 Vanc 8/22 >8/22, 8/22 >8/27, 8/27 > Cefepime 8/22 >8/26, 8/26 > 8/27 Ceftriaxone 8/27 >> xxxx Clinda 8/30 preop Gentamicon 8/30 preop Flagyl 3 dose 8/30 preop Neomycin oral 8/29 3 doses preop Eraxis 9/1>> linezolid 8/31>>  Interim history/subjective:   8/29 - stil volume overloaded at +19.9L since admit. 40% fio2, Pn fent gtt. Off diprivan and neo. On TF.  CCS plans for 8/30 -> diverting loop colostomy for fecal diversion given massive soft tissue infection and necrosis limited mobility with amputation 8/30-no overnight events, off pressors 9/1-no overnight events 9/2 less responsive today  Objective   Blood pressure (!) 197/65, pulse 79, temperature 99.9 F (37.7 C), temperature source Axillary, resp. rate (!) 25, height 6\' 2"  (1.88 m), weight 88.6 kg, SpO2 100 %.    Vent Mode: PSV;CPAP FiO2 (%):  [30  %] 30 % Set Rate:  [15 bmp] 15 bmp Vt Set:  [650 mL] 650 mL PEEP:  [5 cmH20] 5 cmH20 Pressure Support:  [10 cmH20] 10 cmH20 Plateau Pressure:  [13 cmH20-17 cmH20] 17 cmH20   Intake/Output Summary (Last 24 hours) at 07/23/2020 0836 Last data filed at 07/23/2020 8185 Gross per 24 hour  Intake 1145.06 ml  Output 5980 ml  Net -4834.94 ml   Filed Weights   07/18/20 0500 07/21/20 0500 07/23/20 0403  Weight: 91.9 kg 92.4 kg 88.6 kg   General Appearance: Critically ill-appearing, less responsive today Head: Normocephalic, atraumatic Eyes: Pupils equal and reactive Throat:   endotracheal tube in place Neck:  Supple, no JVD Lungs: Clear breath sounds anteriorly Heart: S1-S2 appreciated Abdomen: Soft, no organomegaly Skin: Warm and dry  Resolved Hospital Problem list   Circulatory shock 8/28  Assessment & Plan:  Acute hypoxic respiratory failure -Continue ventilator support -Not ready for extubation -VAP bundle in place -Obtain ABG  Fournier's gangrene Post debridement -Post diverting colostomy -Continue antibiotics, currently on linezolid, ceftriaxone, Eraxis  Chronic obstructive pulmonary disease -Continue bronchodilators  Circulatory shock -Has been  off pressors  Hypertension -We will give labetalol -Norvasc 10 mg per tube  Acute kidney injury -Cautious diuresis -Trend electrolytes -Follow labs today  Anemia -Transfuse per protocol  Decrease sedation medications and assess mental status Follow labs today Less responsive today    Best practice:  Diet: Tube feeds Pain/Anxiety/Delirium protocol (if indicated): sedation protocol VAP protocol (if indicated): Bundle in place DVT prophylaxis: Lovenox/Heparin GI prophylaxis: ppi Glucose control: ssi Mobility: bed rest Code Status: full code  Family Communication: Sister Johanna Stafford main decision makder  [phone]   updated 07/18/20  Over phone. - she had questions about why being asked to set up password  as new process. Offered to refer to office of patient experience. However, she wanted bedside RN to give her  A call  8/29 - updated sister Glyndon Tursi over phone   Disposition: [facility]  The patient is critically ill with multiple organ systems failure and requires high complexity decision making for assessment and support, frequent evaluation and titration of therapies, application of advanced monitoring technologies and extensive interpretation of multiple databases. Critical Care Time devoted to patient care services described in this note independent of APP/resident time (if applicable)  is 32 minutes.   Sherrilyn Rist MD [facility] Personal pager: [phone] If unanswered, please page CCM On-call: [phone]

## 2020-07-23 NOTE — Progress Notes (Signed)
Updated patient's sister [name] about his status

## 2020-07-23 NOTE — Progress Notes (Signed)
   07/23/20  Hydro treatment [id number]     Subjective on VENT- ETT  Patient and Family Stated Goals unable, no family  Prior Treatments unsure  Evaluation and Treatment  Evaluation and Treatment Procedures Explained to Patient/Family No  Evaluation and Treatment Procedures Patient unable to consent due to mental status  Wound / Incision (Open or Dehisced) 07/22/20 Incision - Open Rectum Right PT ONLY: large post I and D open wound from guteal to scr0tum, dark slough and tan drainage around srotum  Date First Assessed/Time First Assessed: 07/22/20 1044   Wound Type: Incision - Open  Location: Rectum  Location Orientation: Right  Wound Description (Comments): PT ONLY: large post I and D open wound from guteal to scrtum, dark slough and tan draina...  Dressing Type ABD;Moist to moist  Dressing Changed New  Dressing Status Clean;Dry  Dressing Change Frequency Twice a day  Site / Wound Assessment Friable;Brown;Dusky  % Wound base Red or Granulating 0%  % Wound base Yellow/Fibrinous Exudate 75%  % Wound base Black/Eschar 25%  % Wound base Other/Granulation Tissue (Comment)  (palpate L ischium)  Peri-wound Assessment Denuded;Maceration;Excoriated;Purple  Wound Length (cm) 25 cm  Wound Width (cm) 15 cm  Wound Depth (cm) 3 cm  Wound Volume (cm^3) 1125 cm^3  Wound Surface Area (cm^2) 375 cm^2  Margins Unattached edges (unapproximated)  Drainage Amount Copious  Drainage Description Purulent;Sanguineous  Non-staged Wound Description Full thickness  Treatment Hydrotherapy (Pulse lavage);Packing (Saline gauze)  Hydrotherapy  Pulsed lavage therapy - wound location sacrum to scrotum  Pulsed Lavage with Suction (psi) 4 psi  Pulsed Lavage with Suction - Normal Saline Used 1000 mL  Wound Therapy - Assess/Plan/Recommendations  Wound Therapy - Clinical Statement wound has dark, slough throughout large wound. patient had eyes open but did not appear to respond to any stimuli. Continue Hydrotherapy  and reassess progress with surgery next week. Requires 3 persons to manage vent and lines and for positioning  Wound Therapy - Functional Problem List on Vent, RAKA  Factors Delaying/Impairing Wound Healing Incontinence;Infection - systemic/local;Immobility;Multiple medical problems  Hydrotherapy Plan Debridement;Dressing change;Patient/family education;Pulsatile lavage with suction  Wound Therapy - Frequency 6X / week  Wound Therapy - Current Recommendations  (LTACH)  Wound Therapy - Follow Up Recommendations  (LTACH)  Wound Plan hydrotherapy , dressing changes  Wound Therapy Goals - Improve the function of patient's integumentary system by progressing the wound(s) through the phases of wound healing by:  Decrease Necrotic Tissue to 50  Decrease Necrotic Tissue - Progress Progressing toward goal  Increase Granulation Tissue to 50  Increase Granulation Tissue - Progress Progressing toward goal  Goals/treatment plan/discharge plan were made with and agreed upon by patient/family No, Patient unable to participate in goals/treatment/discharge plan and family unavailable  Time For Goal Achievement 2 weeks  Wound Therapy - Potential for Goals Poor  Tresa Endo PT Acute Rehabilitation Services Pager [phone] Office [phone]

## 2020-07-23 NOTE — Progress Notes (Signed)
Pharmacy Antibiotic Note  Omar Todd is a 69 y.o. male admitted on 07/01/2020 with necrotizing fasciitis and candida tracheitis.  Pharmacy has been consulted for fluconazole dosing. Patient also receiving linezolid and ceftriaxone. Add metronidazole 9/2 since necrotizing infection to cover anaerobes.  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    Plan:  Anidulafungin dose given today so start fluconazole 800mg  IV x 1 9/3 followed by 200mg  PT daily on 9/4 (adjusted for CrCl < 50 ml/min)  No need to renally adjust other antibiotics  Follow SCr and CrCl to determine need to increase fluconazole dose to 400mg  daily  Height: 6\' 2"  (188 cm) Weight: 88.6 kg (195 lb 5.2 oz) IBW/kg (Calculated) : 82.2  Temp (24hrs), Avg:99.8 F (37.7 C), Min:98.6 F (37 C), Max:100.5 F (38.1 C)  Recent Labs  Lab 07/17/20 0457 07/17/20 0457 07/18/20 0352 07/18/20 0352 07/19/20 0628 07/19/20 1202 07/19/20 1210 07/12/2020 1000 07/21/20 0406 07/21/20 1139 07/22/20 0836  WBC 16.0*   < > 18.1*  --  23.7*  --   --  28.8* 34.4*  --  40.4*  CREATININE 0.55*   < > 0.46*   < > 1.08 0.95  --  1.38* 1.53*  --  1.74*  LATICACIDVEN 1.3  --   --   --   --   --  1.5  --   --   --   --   VANCOTROUGH 13*  --   --   --  26*  --   --   --   --   --   --   VANCORANDOM  --   --   --   --   --   --   --   --   --  23  --    < > = values in this interval not displayed.    Estimated Creatinine Clearance: 46.6 mL/min (A) (by C-G formula based on SCr of 1.74 mg/dL (H)).    Allergies  Allergen Reactions  . Penicillins Other (See Comments)    Pt. States he "passed out"    Antimicrobials this admission:  8/22 Cipro x 1 8/22 Clinda x 1, resume 8/23 >>8/28 8/22 Vancomycin >> 8/31 8/23 Metronidazole >> 8/23 8/23 Cefepime >>8/27 8/27 Ceftriaxone >>  8/30 Clinda/Gent  IV x 1 pre-op 8/31 Zyvox >> 9/1 eraxis>> 9/2 9/3 fluconazole >> 9/2 metronidazole >>   Dose adjustments this admission:  8/27 VT at 05:30 = 13 on 750mg  q8h --> increase to 1g q8h 8/29 VT at 05:30 = 26 on 1g IV q8h and SCr doubled.  Decrease to 1g q12h 8/30 Discontinued, dose off of random levels  Microbiology results:  8/22 COVID: neg 8/22 BCx: NGF 8/22 UCx:  multiple species (F) 8/22 Wound from ulcer cxs: multiple organisms, no staph, no group A strep 8/23 Sacrum abscess: multiple organisms, no staph, no group A strep (F) 8/23 Ischial wound: Strep agalactiae 8/23 AKA stump: rare MRSA, rare GBS (F) 8/23 Inguinal wound: rare Klebsiella oxytoca (pan-sens except ampicillin), rare strep constellatus (sens: levo, vanc)  (F) 8/24 MRSA PCR: positive 8/25 Respiratory panel (RSV/Flu/COVID): all neg 8/29 Respiratory cxt: flu neg, covid neg 8/29 TA: moderate candida albicans F 8/26 & 8/29 & 8/30 covid neg  Thank you for allowing pharmacy to be a part of this patient's care.  Doreene Eland, PharmD, BCPS.   Work  Cell: [phone] 07/23/2020 9:51 AM

## 2020-07-23 NOTE — Progress Notes (Signed)
CT head with new stroke   Discussed with neurology  We will get MRI of the brain with and without  Suspicion is that he is showering into his brain from bacteremia/endocarditis  Keep systolic less than 115

## 2020-07-24 ENCOUNTER — Inpatient Hospital Stay (HOSPITAL_COMMUNITY)
Admit: 2020-07-24 | Discharge: 2020-07-24 | Disposition: A | Discharge status: Home / Self Care | Payer: Medicare Other | Attending: Neurology | Admitting: Neurology

## 2020-07-24 ENCOUNTER — Inpatient Hospital Stay (HOSPITAL_COMMUNITY): Payer: Medicare Other

## 2020-07-24 DIAGNOSIS — G9340 Encephalopathy, unspecified: Secondary | ICD-10-CM

## 2020-07-24 DIAGNOSIS — R569 Unspecified convulsions: Secondary | ICD-10-CM

## 2020-07-24 LAB — BASIC METABOLIC PANEL
Anion gap: 15 (ref 5–15)
BUN: 150 mg/dL — ABNORMAL HIGH (ref 8–23)
CO2: 25 mmol/L (ref 22–32)
Calcium: 7.8 mg/dL — ABNORMAL LOW (ref 8.9–10.3)
Chloride: 117 mmol/L — ABNORMAL HIGH (ref 98–111)
Creatinine, Ser: 1.55 mg/dL — ABNORMAL HIGH (ref 0.61–1.24)
GFR calc Af Amer: 52 mL/min — ABNORMAL LOW (ref >60)
GFR calc non Af Amer: 45 mL/min — ABNORMAL LOW (ref >60)
Glucose, Bld: 226 mg/dL — ABNORMAL HIGH (ref 70–99)
Potassium: 2.1 mmol/L — CL (ref 3.5–5.1)
Sodium: 157 mmol/L — ABNORMAL HIGH (ref 135–145)

## 2020-07-24 LAB — TSH: TSH: 1.427 u[IU]/mL (ref 0.350–4.500)

## 2020-07-24 LAB — GLUCOSE, CAPILLARY
Glucose-Capillary: 204 mg/dL — ABNORMAL HIGH (ref 70–99)
Glucose-Capillary: 173 mg/dL — ABNORMAL HIGH (ref 70–99)
Glucose-Capillary: 197 mg/dL — ABNORMAL HIGH (ref 70–99)
Glucose-Capillary: 172 mg/dL — ABNORMAL HIGH (ref 70–99)
Glucose-Capillary: 120 mg/dL — ABNORMAL HIGH (ref 70–99)
Glucose-Capillary: 203 mg/dL — ABNORMAL HIGH (ref 70–99)

## 2020-07-24 LAB — CBC
HCT: 28 % — ABNORMAL LOW (ref 39.0–52.0)
Hemoglobin: 8.2 g/dL — ABNORMAL LOW (ref 13.0–17.0)
MCH: 24.3 pg — ABNORMAL LOW (ref 26.0–34.0)
MCHC: 29.3 g/dL — ABNORMAL LOW (ref 30.0–36.0)
MCV: 83.1 fL (ref 80.0–100.0)
Platelets: 459 10*3/uL — ABNORMAL HIGH (ref 150–400)
RBC: 3.37 MIL/uL — ABNORMAL LOW (ref 4.22–5.81)
RDW: 20.7 % — ABNORMAL HIGH (ref 11.5–15.5)
WBC: 28.3 10*3/uL — ABNORMAL HIGH (ref 4.0–10.5)
nRBC: 0 % (ref 0.0–0.2)

## 2020-07-24 LAB — AMMONIA: Ammonia: 20 umol/L (ref 9–35)

## 2020-07-24 IMAGING — MR MR MRA HEAD W/O CM
3 of 5 series · 16 of 48 positions shown · non-contrast
Comparison: MRI [DATE]

CLINICAL DATA: Follow-up stroke. Cerebellar and occipital
infarctions. Cerebral hemispheric watershed infarctions.

EXAM:
MRA NECK WITHOUT  CONTRAST
MRA HEAD WITHOUT CONTRAST
TECHNIQUE: Multiplanar and multiecho pulse sequences of the neck were obtained
without intravenous contrast. Angiographic images of the neck were
obtained using MRA technique without intravenous contast.;
Angiographic images of the Circle of Willis were obtained using MRA
technique without intravenous contrast.

[Series 14: tof_2d_tra · axial · 3.5mm · 0.43mm/px · z∈[-247,-22]mm · 11 of 93 slices shown]
[im 1/93]
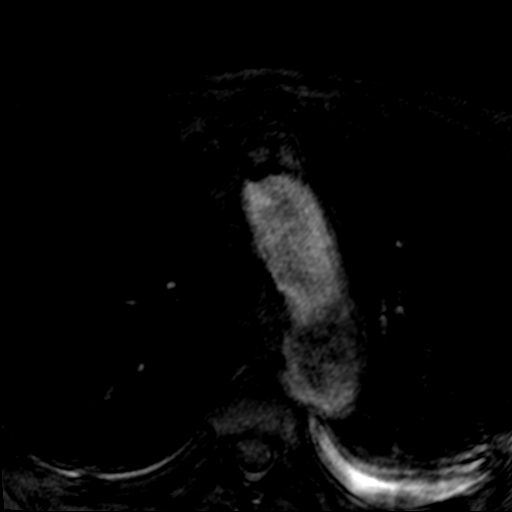
[im 10/93]
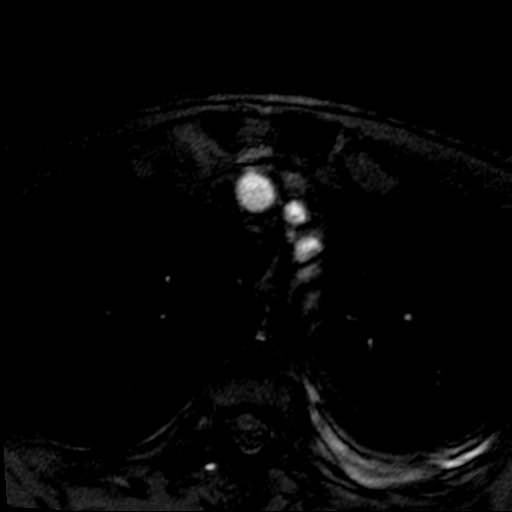
[im 19/93]
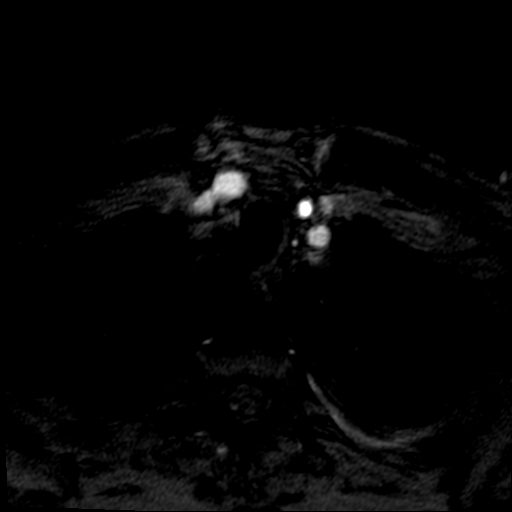
[im 28/93]
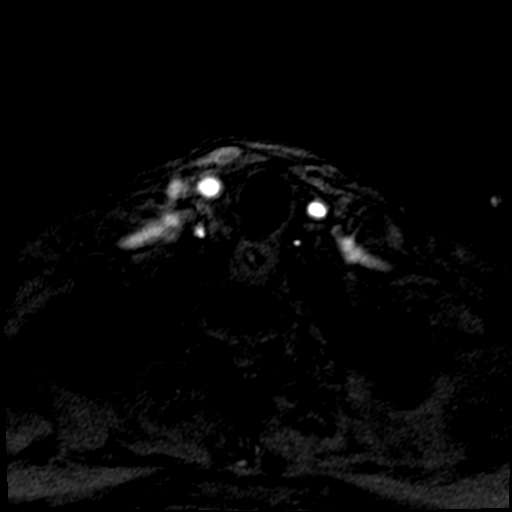
[im 37/93]
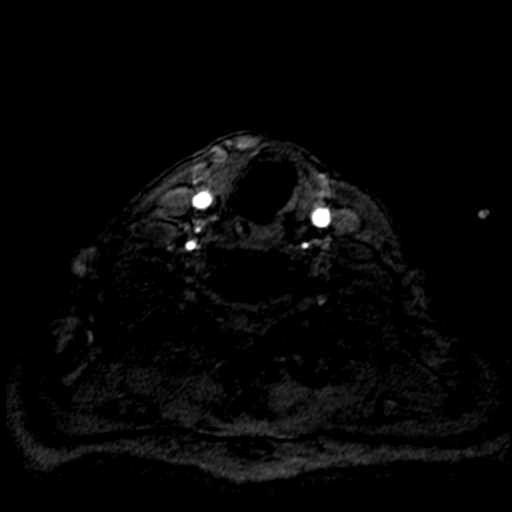
[im 47/93]
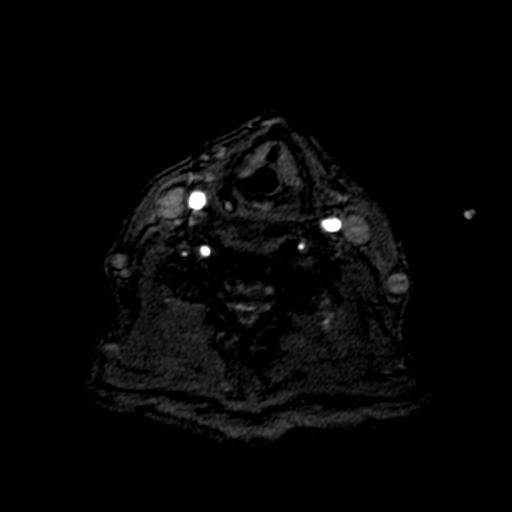
[im 56/93]
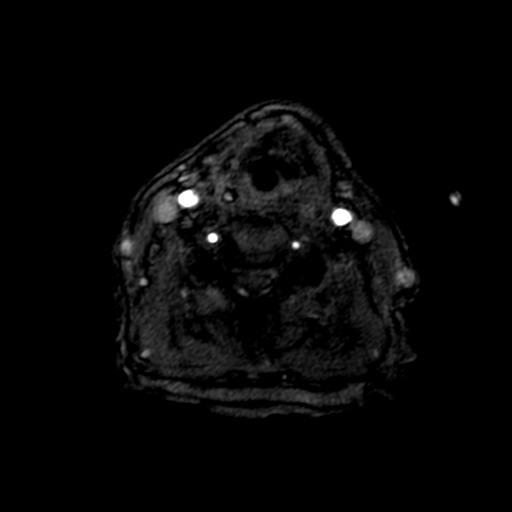
[im 65/93]
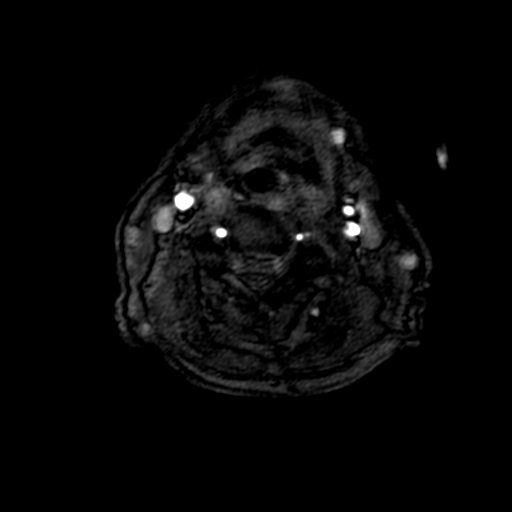
[im 74/93]
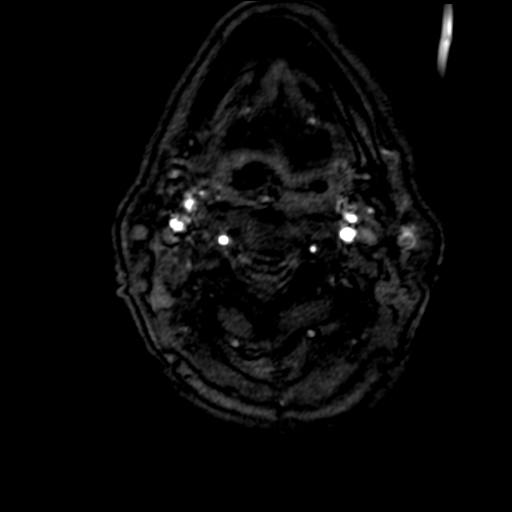
[im 83/93]
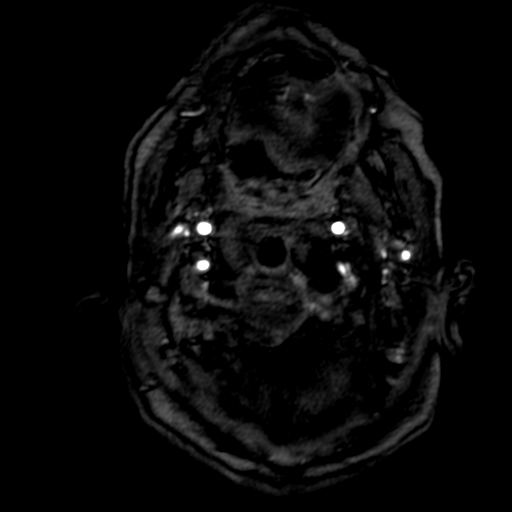
[im 93/93]
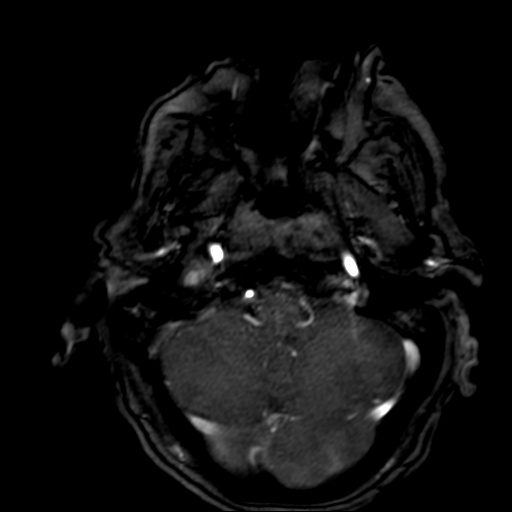

[Series 18: tof_2d_tra_mip_tra · axial · 228.9mm · 0.43mm/px · 1 of 1 slices shown]
[im 1/1]
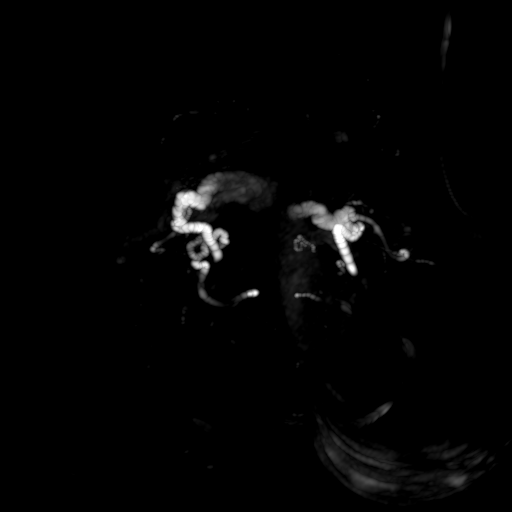

[Series 19: tof_3d_multi-slab-bifurcation · axial · 1.0mm · 0.26mm/px · z∈[-191,-83]mm · 4 of 136 slices shown]
[im 10/136]
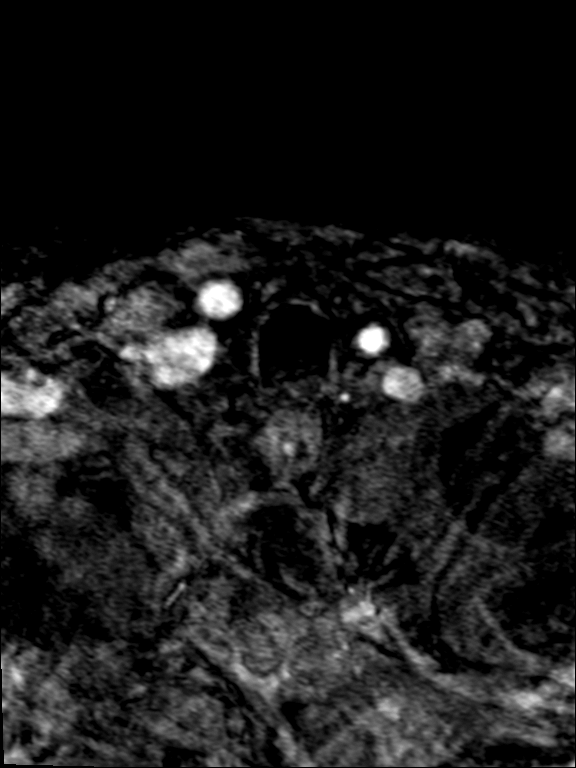
[im 19/136]
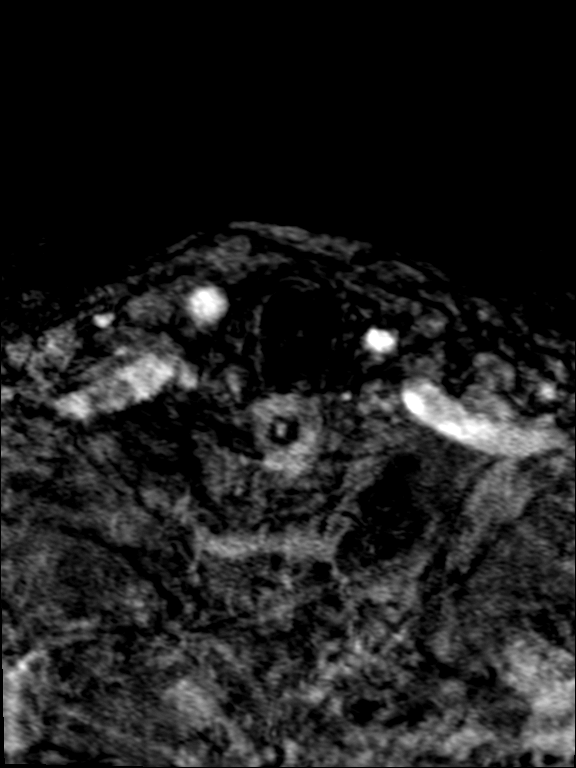
[im 73/136]
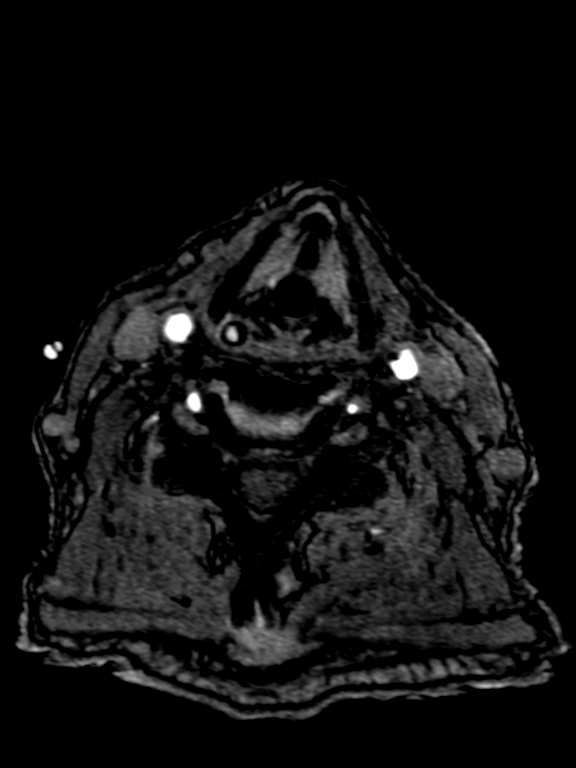
[im 118/136]
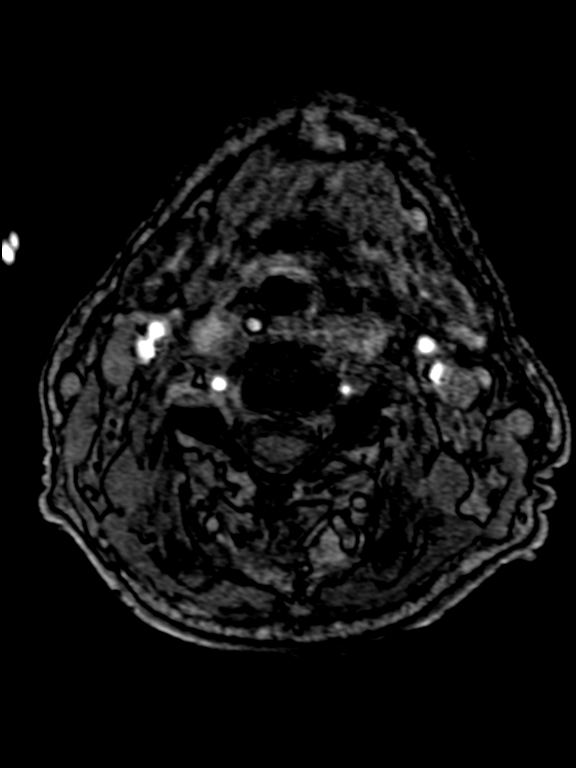

[16 of 48 positions shown; findings below may reference images not displayed]

FINDINGS: MRA NECK FINDINGS

Both common carotid arteries show antegrade flow to the bifurcation
regions. Mild atherosclerotic plaque at both carotid bifurcations.
No measurable stenosis of the ICA on the right. 30% narrowing of the
proximal ICA bulb on the left. Both vertebral arteries show
antegrade flow, with the right being dominant.

MRA HEAD FINDINGS

Both internal carotid arteries are widely patent through the skull
base and siphon regions. The anterior and middle cerebral vessels
are patent without proximal stenosis, aneurysm or vascular
malformation. No large or medium vessel occlusion. Distal branch
vessels show some atherosclerotic irregularity.

Dominant right vertebral artery widely patent to the basilar. Non
dominant left vertebral artery terminates in PICA, except for a tiny
contribution to the basilar. No basilar stenosis. Posterior
circulation branch vessels are normal. Left PCA takes fetal origin
from the anterior circulation. Flow is visible in both
posteroinferior cerebellar arteries, both superior cerebellar
arteries and both posterior cerebral arteries.
IMPRESSION: 1. Mild atherosclerotic plaque at both carotid bifurcations. 30%
narrowing of the proximal ICA bulb on the left. No measurable
stenosis of the right ICA.
2. No intracranial large or medium vessel occlusion or correctable
proximal stenosis. Distal vessel atherosclerotic irregularity.

## 2020-07-24 IMAGING — MR MR MRA NECK W/O CM
3 of 5 series · 16 of 48 positions shown · non-contrast
Comparison: MRI [DATE]

CLINICAL DATA: Follow-up stroke. Cerebellar and occipital
infarctions. Cerebral hemispheric watershed infarctions.

EXAM:
MRA NECK WITHOUT  CONTRAST
MRA HEAD WITHOUT CONTRAST
TECHNIQUE: Multiplanar and multiecho pulse sequences of the neck were obtained
without intravenous contrast. Angiographic images of the neck were
obtained using MRA technique without intravenous contast.;
Angiographic images of the Circle of Willis were obtained using MRA
technique without intravenous contrast.

[Series 14: tof_2d_tra · axial · 3.5mm · 0.43mm/px · z∈[-247,-22]mm · 11 of 93 slices shown]
[im 1/93]
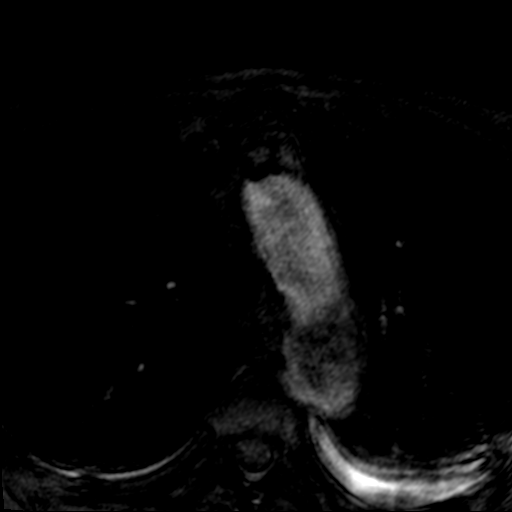
[im 10/93]
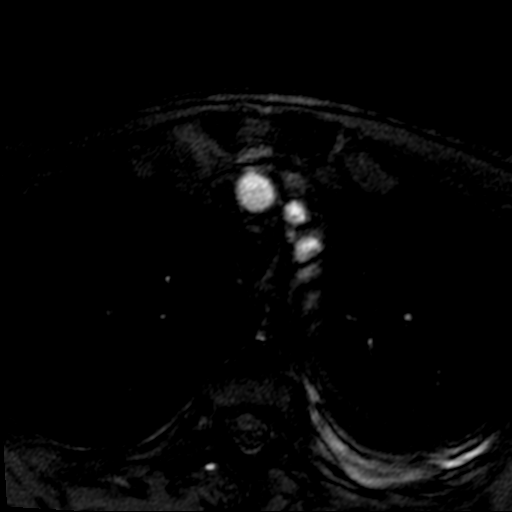
[im 19/93]
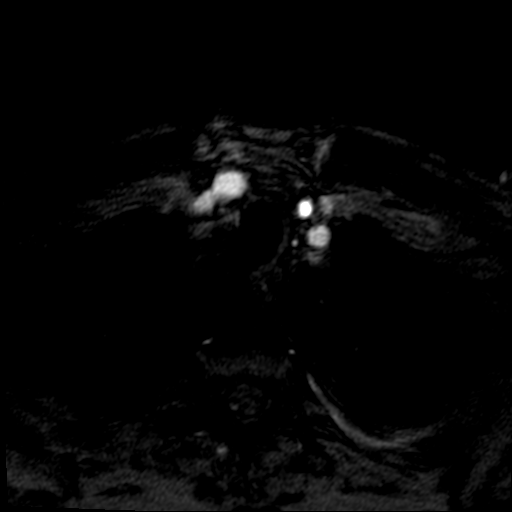
[im 28/93]
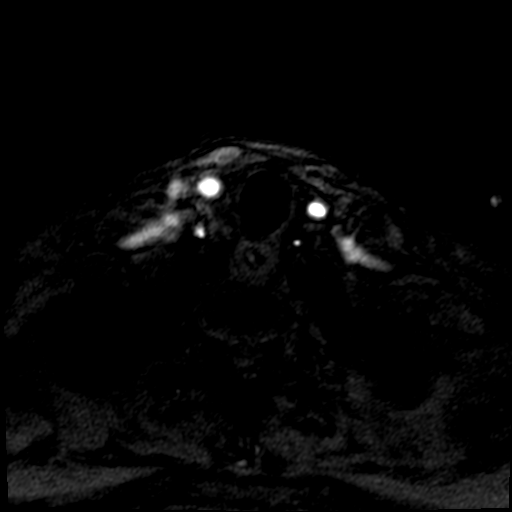
[im 37/93]
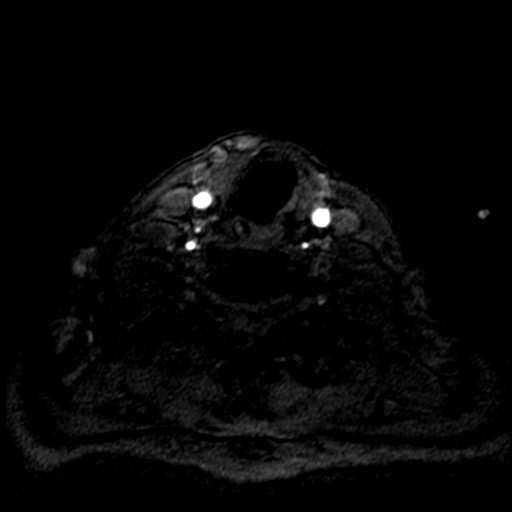
[im 47/93]
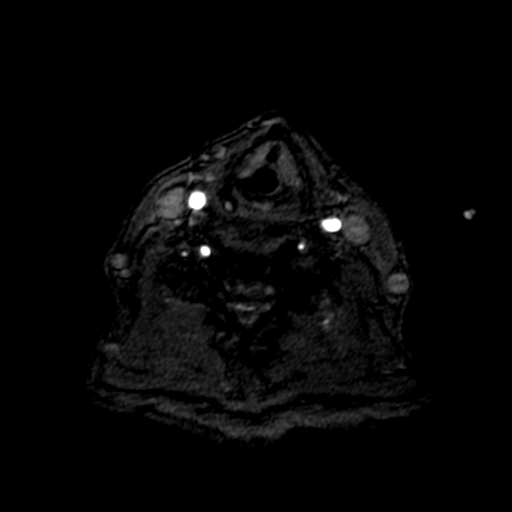
[im 56/93]
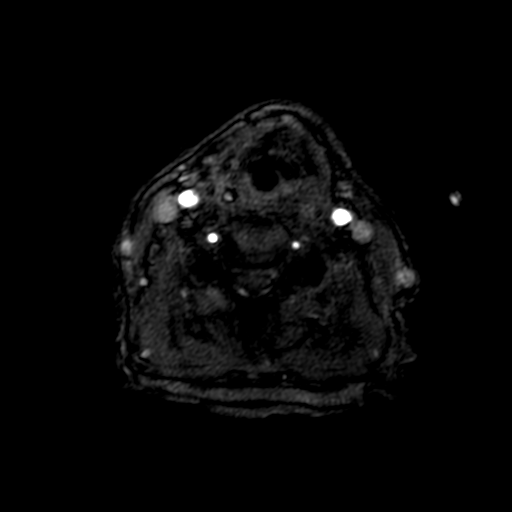
[im 65/93]
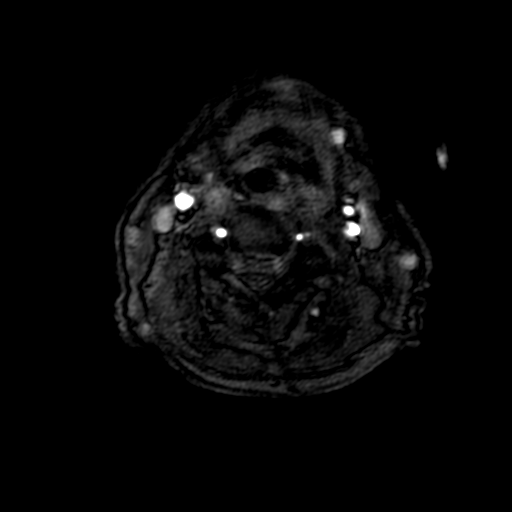
[im 74/93]
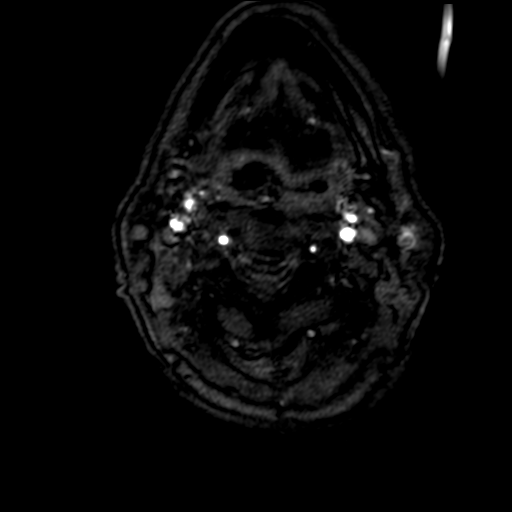
[im 83/93]
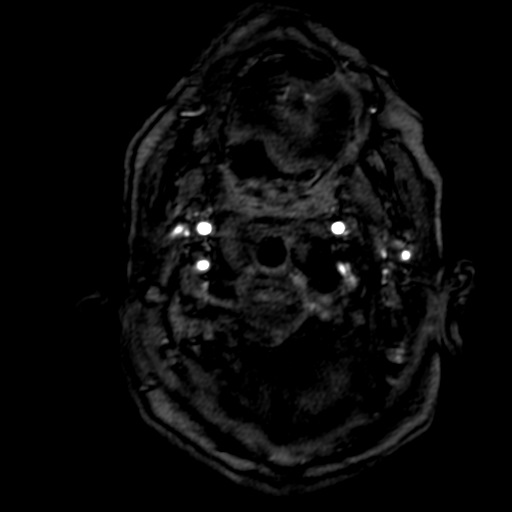
[im 93/93]
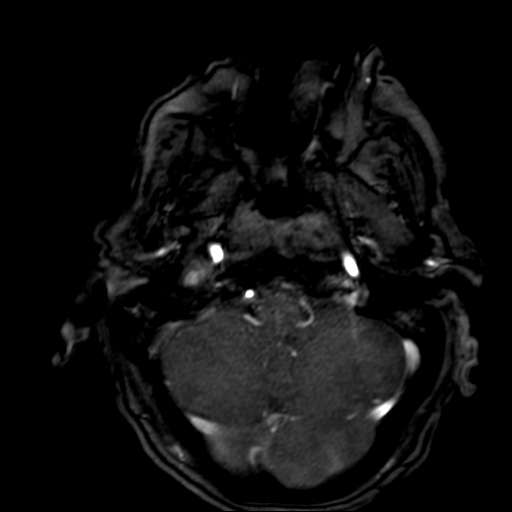

[Series 18: tof_2d_tra_mip_tra · axial · 228.9mm · 0.43mm/px · 1 of 1 slices shown]
[im 1/1]
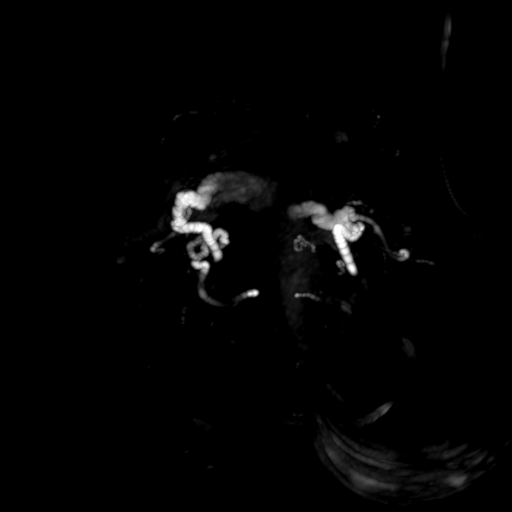

[Series 19: tof_3d_multi-slab-bifurcation · axial · 1.0mm · 0.26mm/px · z∈[-191,-83]mm · 4 of 136 slices shown]
[im 10/136]
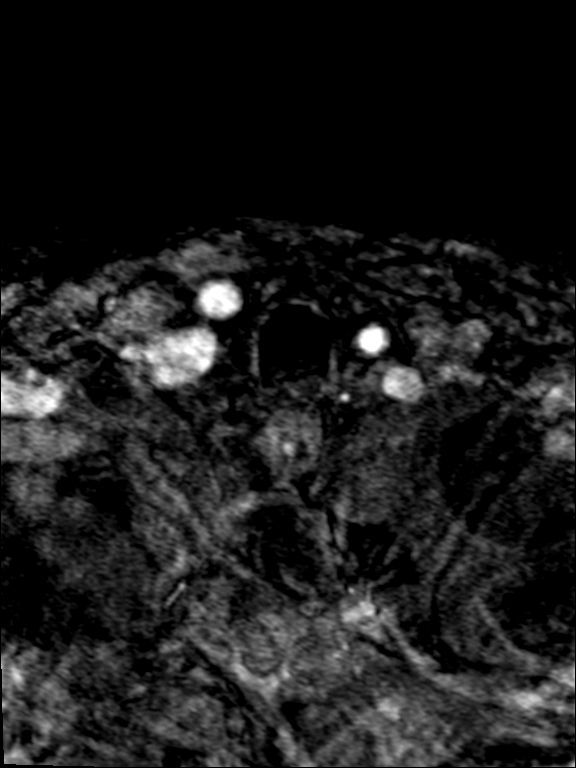
[im 19/136]
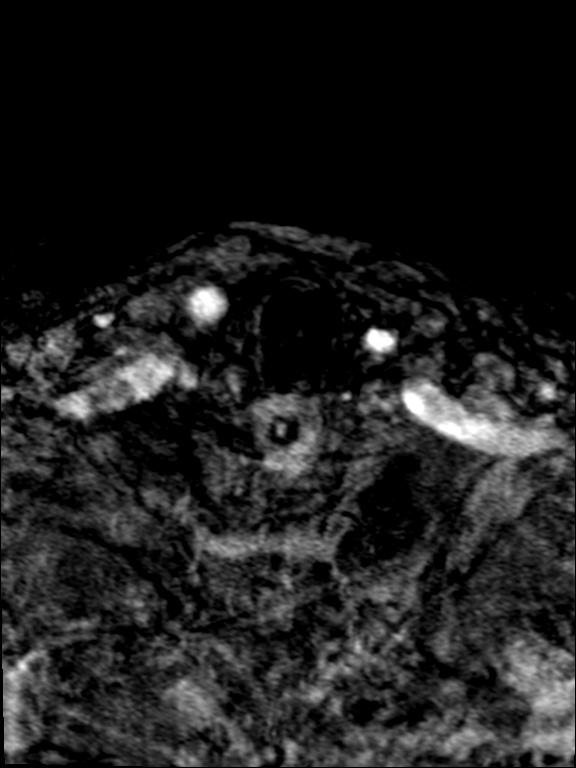
[im 73/136]
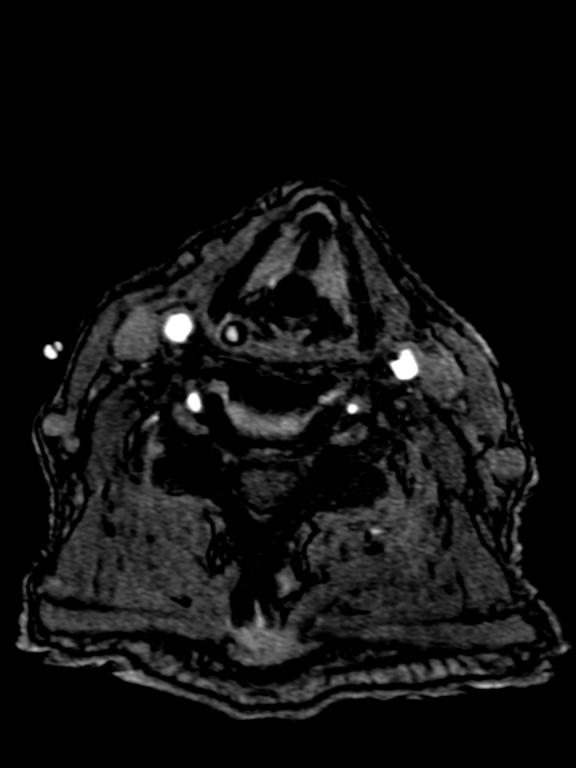
[im 118/136]
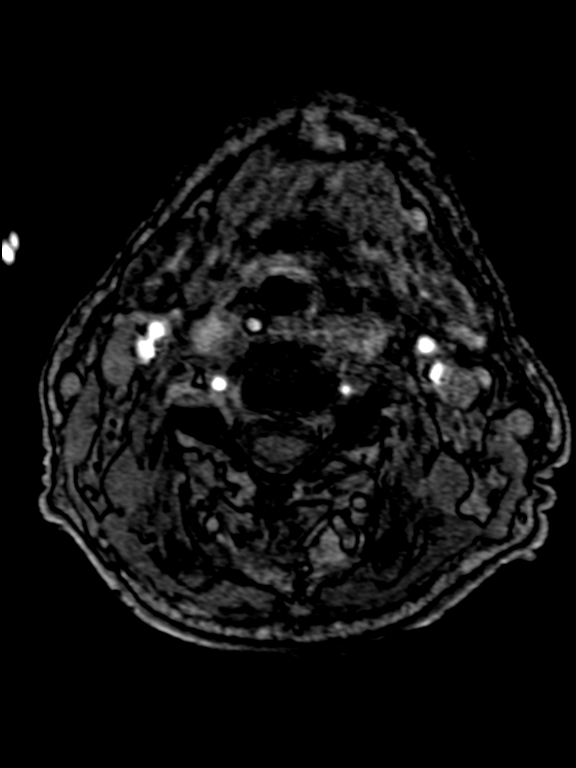

[16 of 48 positions shown; findings below may reference images not displayed]

FINDINGS: MRA NECK FINDINGS

Both common carotid arteries show antegrade flow to the bifurcation
regions. Mild atherosclerotic plaque at both carotid bifurcations.
No measurable stenosis of the ICA on the right. 30% narrowing of the
proximal ICA bulb on the left. Both vertebral arteries show
antegrade flow, with the right being dominant.

MRA HEAD FINDINGS

Both internal carotid arteries are widely patent through the skull
base and siphon regions. The anterior and middle cerebral vessels
are patent without proximal stenosis, aneurysm or vascular
malformation. No large or medium vessel occlusion. Distal branch
vessels show some atherosclerotic irregularity.

Dominant right vertebral artery widely patent to the basilar. Non
dominant left vertebral artery terminates in PICA, except for a tiny
contribution to the basilar. No basilar stenosis. Posterior
circulation branch vessels are normal. Left PCA takes fetal origin
from the anterior circulation. Flow is visible in both
posteroinferior cerebellar arteries, both superior cerebellar
arteries and both posterior cerebral arteries.
IMPRESSION: 1. Mild atherosclerotic plaque at both carotid bifurcations. 30%
narrowing of the proximal ICA bulb on the left. No measurable
stenosis of the right ICA.
2. No intracranial large or medium vessel occlusion or correctable
proximal stenosis. Distal vessel atherosclerotic irregularity.

## 2020-07-24 MED ORDER — POTASSIUM CHLORIDE 20 MEQ/15ML (10%) PO SOLN
40.0000 meq | Freq: Once | ORAL | Status: AC
  Administered 2020-07-24: 40 meq
  Filled 2020-07-24: qty 30

## 2020-07-24 MED ORDER — POTASSIUM CHLORIDE 10 MEQ/50ML IV SOLN
10.0000 meq | INTRAVENOUS | Status: AC
  Administered 2020-07-24 (×4): 10 meq via INTRAVENOUS
  Filled 2020-07-24 (×4): qty 50

## 2020-07-24 MED ORDER — COLLAGENASE 250 UNIT/GM EX OINT
TOPICAL_OINTMENT | Freq: Two times a day (BID) | CUTANEOUS | Status: DC
  Filled 2020-07-24: qty 30

## 2020-07-24 MED ORDER — FLUCONAZOLE 100 MG PO TABS
400.0000 mg | ORAL_TABLET | Freq: Every day | ORAL | Status: AC
  Administered 2020-07-25 – 2020-07-26 (×2): 400 mg
  Filled 2020-07-24 (×2): qty 4

## 2020-07-24 MED ORDER — NOREPINEPHRINE 4 MG/250ML-% IV SOLN
0.0000 ug/min | INTRAVENOUS | Status: DC
Start: 2020-07-24 — End: 2020-07-24

## 2020-07-24 NOTE — Progress Notes (Signed)
[facility] ADULT ICU REPLACEMENT PROTOCOL   The patient does apply for the [facility] Adult ICU Electrolyte Replacment Protocol based on the criteria listed below:   1. Is GFR >/= 30 ml/min? Yes.    Patient's GFR today is 45 2. Is SCr </= 2? Yes.   Patient's SCr is 1.55 ml/kg/hr 3. Did SCr increase >/= 0.5 in 24 hours? No. 4. Abnormal electrolyte(s): k 2.1 5. Ordered repletion with: protocol 6. If a panic level lab has been reported, has the CCM MD in charge been notified? No..   Physician:    Ronda Fairly A 07/24/2020 4:59 AM

## 2020-07-24 NOTE — Progress Notes (Signed)
NAME:  EMANUELLE BASTOS, MRN:  [id number], DOB:  January 22, 1951, LOS: 38 ADMISSION DATE:  06/26/2020, CONSULTATION DATE:  07/14/20 REFERRING MD:  [name] trnasfer, CHIEF COMPLAINT:  [name] at [facility] History    30 pack current smoker with DM, COPD Nos, BP, lipid, GERD. 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 At some pont also has stage 2 sacral decub "Size of a quarter"- described at time of this discharge. Discharged to ? Rehab and then signed out ? AMA from rehab.   Readimmited to [facility] 07/05/2020 with sepsis syndrome(fever + SIRS with lactate 2.1) with growing. Rt buttock ulcer noted to be stage 3 large wth fecal soiling but R AKA stump incision well healed but  By 07/09/2020 purulent drainage noted. CT pelvis c/w necrotizing fasciitis (with sub cut gas) in right perineal soft tissue and bedside exam was concerning for crepitus and concern for Fournier gangrene. Patient noticed to be disoriented by 06/22/2020  On 07/02/2020 Hervey Ard excisional debridement of right buttock, right inguinal region, and right scrotal skin measuring (right buttock / right inguinal wound 20cmX10cmX4cm and right scrotal skin 15cmX8cmX2cm); Drainage and packing of Right AKA wound infection  -. 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 Concern for need for repeated debridement and urology involvement and patient transferred to [facility] from [facility] on 07/14/20. Upon arrival getting PRBC for hgb < 7gm% and sedatedh diprivan gtt and fent gtt on ventilator needing neo for bp support.    Decision maker:  he has a wife at [facility] with dementia and estranged children he has not seen since they were babies.Warwick Nick [phone]  She is his  closest relative and decision maker. She does not have health care power of attorney.    Past Medical History     has a past medical history of COPD (chronic obstructive pulmonary disease) ([place]), Diabetes mellitus without complication ([place]), Gangrene of toe of right foot ([place]), GERD (gastroesophageal reflux disease), Hyperlipidemia, and Hypertension.   reports that he has been smoking cigarettes. He has a 30.00 pack-year smoking history. He has never used smokeless tobacco.   has a past surgical history that includes Thumb surgery; Esophagogastroduodenoscopy (N/A, 03/13/2020); biopsy (03/13/2020); Flexible sigmoidoscopy (N/A, 05/06/2020); Amputation (Right, 05/17/2020); Application if wound vac (Right, 05/17/2020); Amputation (Right, 06/19/2020); Incision and drainage of wound (Right, 07/01/2020); Incision and drainage (Right, 06/21/2020); Incision and drainage abscess (N/A, 06/27/2020); Wound debridement (N/A, 06/25/2020); laparoscopy (N/A, 07/02/2020); and Incision and drainage abscess (N/A, 06/30/2020).   Significant Hospital Events   07/15/2020 - [place] admit 07/14/20  - transfer to [facility] 8/25 - s. I and D and debridement of Rt Gluteal Nec Fasc by CCS and ->   Inspection of wound, debridement of right testicle/cord, scrotal wound edge ( excised tissue approximately 2 x 4 cm) by Urology. On neo gtt, diprivan gtt, fent gtt . On vent 40% . Not on TF (yet). Ortho Dr Sharol Given recmmended -> packing the  R AKA wound daily . Seen by Dr Gloriann Loan of Urology - currently R testicle exposed but viable tissue  8/26 = PRBC < 7 and 1 u prbc ordered. On vent. BAck to OR today, . Exposure to sister with  covid + . Sister Inez Catalina the DPOA reported that she is covid positive 8/.25/2 but she has not been at bedside. Later the visitor - sster. [name] is also covid positive. APteitn repeat covid - negative  8/27 - Followed commands on [facility] 8/27 am Remains on fent gtt at 125 mcg, diprivan gtt at 30 mcg and neo gtt at 75 mcg via PICC  line.  T max 101 WBC 16 HGB 7.6 Platelets 170,000 Lactate 1.3 Potassium 3.9 Mag 2 + 16 L since admission    8/28 -  +18L since admit. Looking more voume overloaded. Increased fio2 50% on vent. CXR wet. Unable to wean. On fent gtt, diprivan gtt, neo gtt and LR at 100cc/h  9/1 Sedate -Attempts to open his eyes to voice  9/2-noted not to be as responsive, blood gases obtained on ventilator adjusted, with no improvement in status a CT scan of the head was obtained showing evidence of CVA This was followed with an MRI showing multiple infarcts  Consults:  8/24 - pccm  Procedures:  8/23 -  On 06/24/2020 Hervey Ard excisional debridement of right buttock, right inguinal region, and right scrotal skin measuring (right buttock / right inguinal wound 20cmX10cmX4cm and right scrotal skin 15cmX8cmX2cm); Drainage and packing of Right AKA wound infection ETT 8/23  RUE picc 8/24  Significant Diagnostic Tests:  cxr with basal atelectasis  Micro Data:   Microbiology results: 8/22 BCx:  8/22 UCx:   8/22 Wound cx 8/23 - wound - multiple org. None predominant 8/23 wound inguinal -  KLEBSIELLA OXYTOCA     Organism ID, Bacteria STREPTOCOCCUS CONSTELLATUS    xxx 8/26 covid pcr - neg Covid negative  Antimicrobials:  Antimicrobials this admission: Cipro IV 8/22 >>8/22 Clinda 8/22 >8/22, 8/23 > 8/28 Vanc 8/22 >8/22, 8/22 >8/27, 8/27 > Cefepime 8/22 >8/26, 8/26 > 8/27 Ceftriaxone 8/27 >> xxxx Clinda 8/30 preop Gentamicon 8/30 preop Flagyl 3 dose 8/30 preop Neomycin oral 8/29 3 doses preop Eraxis 9/1>> linezolid 8/31>>  Interim history/subjective:   8/29 - stil volume overloaded at +19.9L since admit. 40% fio2, Pn fent gtt. Off diprivan and neo. On TF.  CCS plans for 8/30 -> diverting loop colostomy for fecal diversion given massive soft tissue infection and necrosis limited mobility with amputation 8/30-no overnight events, off pressors 9/1-no overnight events 9/2 less responsive  today 9/3-new CVA-being evaluated  Objective   Blood pressure (!) 172/64, pulse (!) 104, temperature (!) 100.9 F (38.3 C), temperature source Oral, resp. rate (!) 31, height 6\' 2"  (1.88 m), weight 78.9 kg, SpO2 98 %. CVP:  [6 mmHg] 6 mmHg  Vent Mode: PRVC FiO2 (%):  [30 %] 30 % Set Rate:  [30 bmp] 30 bmp Vt Set:  [650 mL] 650 mL PEEP:  [5 cmH20] 5 cmH20 Plateau Pressure:  [17 cmH20-23 cmH20] 20 cmH20   Intake/Output Summary (Last 24 hours) at 07/24/2020 0958 Last data filed at 07/24/2020 0600 Gross per 24 hour  Intake 3171.56 ml  Output 7790 ml  Net -4618.44 ml   Filed Weights   07/21/20 0500 07/23/20 0403 07/24/20 0500  Weight: 92.4 kg 88.6 kg 78.9 kg   General Appearance: Critically ill-appearing, unresponsive Head: Normocephalic, atraumatic Eyes: Pupils 2 mm, reactive Throat:   endotracheal tube in place Neck:  Supple, no JVD Lungs: Clear breath sounds bilaterally Heart: S1-S2 appreciated Abdomen: Soft, no organomegaly Skin: Warm and dry  Resolved Hospital Problem list   Circulatory shock 8/28  Assessment & Plan:  Cerebrovascular accident -Likely secondary from  bacteremia -Watershed infarcts  Encephalopathy -Secondary to above  Acute hypoxic respiratory failure -Continue ventilator support -VAP bundle in place  Fournier's gangrene Post debridement Post diverting colostomy -Continue antibiotics, currently on linezolid, ceftriaxone, Eraxis  Chronic obstructive pulmonary disease -Bronchodilators  Circulatory shock -Has been off pressors  Hypertension -Monitor closely -Treat only if severe hypertension with systolic greater than 169  Acute kidney injury -Cautious diuresis  Anemia -Transfuse per protocol  New stroke with encephalopathy Prognosis is unfortunately very poor   Best practice:  Diet: Tube feeds Pain/Anxiety/Delirium protocol (if indicated): sedation protocol VAP protocol (if indicated): Bundle in place DVT prophylaxis:  Lovenox/Heparin GI prophylaxis: ppi Glucose control: ssi Mobility: bed rest Code Status: full code  Family Communication: Sister Amman Bartel main decision makder  [phone] -last on 9 12/2019  updated 07/18/20  Over phone. - she had questions about why being asked to set up password as new process. Offered to refer to office of patient experience. However, she wanted bedside RN to give her  A call  Disposition: [facility]  The patient is critically ill with multiple organ systems failure and requires high complexity decision making for assessment and support, frequent evaluation and titration of therapies, application of advanced monitoring technologies and extensive interpretation of multiple databases. Critical Care Time devoted to patient care services described in this note independent of APP/resident time (if applicable)  is 32 minutes.   Sherrilyn Rist MD [facility] Personal pager: [phone] If unanswered, please page CCM On-call: [phone]

## 2020-07-24 NOTE — Procedures (Signed)
Patient Name: ROEL DOUTHAT  MRN: [id number]  Epilepsy Attending: Lora Havens  Referring Physician/Provider: Dr Roland Rack Date: 07/24/2020 Duration: 23.54 mins  Patient history: 69yo M with ams. EEG to evaluate for seizure.  Level of alertness: comatose  AEDs during EEG study: None  Technical aspects: This EEG study was done with scalp electrodes positioned according to the 10-20 International system of electrode placement. Electrical activity was acquired at a sampling rate of 500Hz  and reviewed with a high frequency filter of 70Hz  and a low frequency filter of 1Hz . EEG data were recorded continuously and digitally stored.   Description: EEG showed continuous generalized 3 to 6 Hz theta-delta slowing as well as triphasic waves, generalized.   Hyperventilation and photic stimulation were not performed.     ABNORMALITY -Continuous slow, generalized -Triphasic waves, generalized  IMPRESSION: This study is suggestive of severe encephalopathy, nonspecific etiology but could be related to toxic-metabolic etiology. No seizures or epileptiform discharges were seen throughout the recording.  Dashaun Onstott Barbra Sarks

## 2020-07-24 NOTE — Consult Note (Signed)
Neurology Consultation Reason for Consult: Decreased responsiveness Referring Physician: Sloan Leiter  CC: Decreased responsiveness  History is obtained from: Chart review  HPI: Omar Todd is a 69 y.o. male who presented initially on 8/24 to [facility] with infected pressure ulcer/necrotizing fasciitis since it was likely to require frequent debridement,.  He was transferred to [facility].  Blood cultures have been negative.  He has had significant fluctuation in blood pressures, with the lowest documented at 78/45 with a MAP of 54.  At other times, he has been severely hypertensive with blood pressures in the 180s.  He has been sedated since arrival, but given that he was not waking up once sedation was paused CT was obtained yesterday which demonstrates several posterior strokes.  An MRI was subsequently obtained which demonstrates extensive deep watershed infarcts bilaterally as well as what appears to be posterior circulation embolic disease.   LKW: 8/24 tpa given?: no, recent surgery,    ROS: A 14 point ROS was performed and is negative except as noted in the HPI.   Past Medical History:  Diagnosis Date  . COPD (chronic obstructive pulmonary disease) ([place])   . Diabetes mellitus without complication ([place])   . Gangrene of toe of right foot ([place])   . GERD (gastroesophageal reflux disease)   . Hyperlipidemia   . Hypertension      Family History  Problem Relation Age of Onset  . Cancer Father   . Diabetes Sister   . Stroke Brother   . Healthy Sister   . Healthy Sister   . Colon cancer Neg Hx   . Gastric cancer Neg Hx      Social History:  reports that he has been smoking cigarettes. He has a 30.00 pack-year smoking history. He has never used smokeless tobacco. He reports that he does not drink alcohol and does not use drugs.   Exam: Current vital signs: BP (!) 169/58 (BP Location: Left Arm)   Pulse (!) 106   Temp 100.1 F (37.8 C)  (Axillary)   Resp (!) 25   Ht 6\' 2"  (1.88 m)   Wt 88.6 kg   SpO2 97%   BMI 25.08 kg/m  Vital signs in last 24 hours: Temp:  [98.3 F (36.8 C)-102.1 F (38.9 C)] 100.1 F (37.8 C) (09/03 0000) Pulse Rate:  [67-106] 106 (09/03 0100) Resp:  [17-31] 25 (09/03 0100) BP: (115-211)/(36-70) 169/58 (09/03 0100) SpO2:  [96 %-100 %] 97 % (09/03 0100) FiO2 (%):  [30 %] 30 % (09/02 2300) Weight:  [88.6 kg] 88.6 kg (09/02 0403)   Physical Exam  Constitutional: Appears ill Psych: Does not respond Eyes: No scleral injection HENT: ET tube in place MSK: no joint deformities.  Cardiovascular: Normal rate and regular rhythm.  Respiratory: ventilated.  GI: Soft.  No distension. There is no tenderness.  Skin: WDI  Neuro: Mental Status: Patient's eyes open slightly to noxious stimulation. Does not follow commands.  Cranial Nerves: II: Does not blink to threat Pupils are equal, round, and reactive to light.   III,IV, VI: VOR intact in right eye, impaired adduction in the left eye.  V:VII: corneals intact bilaterally.  X: no cough  XI: Shoulder shrug is symmetric. XII: tongue is midline without atrophy or fasciculations.  Motor: No movement in the left arm, minimal flexion left leg and right arm.  Sensory: Increases respiratory rate with nox stim in the left arm, some response in all extremities.  Cerebellar: Does not perform.  I have reviewed labs in epic and the results pertinent to this consultation are: Na 157 K 2.1 Cr 1.55  I have reviewed the images obtained:MRI brain - watershed territory infarcts.   Impression: 69 yo M with infarcts both due to watershed ischemia as well as posterior circulation embolic disease.  I do not see a clear time of severe hypotension, but he did have some fluctuation and I suspect it was likely due to fluctuating blood pressures as opposed to a single severely hypotensive event that caused his watershed ischemia.  It is certainly possible that  his mental status is explained by the watershed infarcts, but I would look for other reversible causes as well.  Recommendations: 1) MRA head and neck 2) antiplatelet therapy with aspirin unless otherwise contraindicated.  3) EEG 4) ammonia, TSH 5) If he improves, will need PT, OT, ST  Roland Rack, MD [facility] [phone]  If 7pm- 7am, please page neurology on call as listed in [place].

## 2020-07-24 NOTE — Progress Notes (Signed)
Spoke with patient's Sister Omar Todd  Updated her about CT scan findings-multiple strokes MRI scan findings-multiple infarcts The fact that he is not waking up at all without sedation on board Extent of his current wounds and gangrene  Mr. Omar Todd will likely not survive this process and at the present time he is comatose, off all sedation  She is not able to make any decisions at present with respect to ongoing care apart from continuing what we are doing She will talk with siblings and get back with [place]  We will continue to follow

## 2020-07-24 NOTE — TOC Progression Note (Signed)
Transition of Care [facility]) - Progression Note    Patient Details  Name: Omar Todd MRN: [id number] Date of Birth: 20-Dec-1950  Transition of Care [facility]) CM/SW Contact  Leeroy Cha, RN Phone Number: 07/24/2020, 7:53 AM  Clinical Narrative:    Temp 102.2, remains on the vent at 30% fi02, iv abxx3, iv sedation, tube feeds, na-157, K+=2.1 bun 150/creat-1.55, wbc-28.3 Mri of brain w ans w/o: IMPRESSION: 1. Bilateral occipital lobe, deep watershed zone and cerebellar infarcts. No acute hemorrhage or mass effect. 2. Generalized atrophy and chronic microvascular ischemia Following for progression and toc needs.       Expected Discharge Plan and Services                                                 Social Determinants of Health (SDOH) Interventions    Readmission Risk Interventions Readmission Risk Prevention Plan 05/18/2020  Transportation Screening Complete  PCP or Specialist Appt within 5-7 Days Not Complete  Not Complete comments plan for SNF  PCP or Specialist Appt within 3-5 Days Not Complete  Not Complete comments plan for SNF  Home Care Screening Complete  Medication Review (RN CM) Referral to Pharmacy  HRI or [place] Complete  Social Work Consult for [place] Planning/Counseling Complete  Palliative Care Screening Not Complete  Palliative Care Screening Not Complete Comments could be appropriate  Medication Review (RN Transport planner) Referral to Pharmacy  Some recent data might be hidden

## 2020-07-24 NOTE — Progress Notes (Signed)
CRITICAL VALUE ALERT  Critical Value:  K - 2.1  Date & Time Notied:  07/24/20 0455  Provider Notified: E-Link Provider   Orders Received/Actions taken: Awaiting orders

## 2020-07-24 NOTE — Progress Notes (Signed)
PHYSICAL THERAPY HYDRO THERAPY   07/24/20  1030 - 11:15   Subjective Assessment  Subjective  --  on VENT- ETT  Patient and Family Stated Goals  --  unable, no family  Prior Treatments  --  unsure  Evaluation and Treatment  Evaluation and Treatment Procedures Explained to Patient/Family  --  No  Evaluation and Treatment Procedures  --  Patient unable to consent due to mental status  Pressure Injury 07/12/2020 Buttocks Right Unstageable - Full thickness tissue loss in which the base of the injury is covered by slough (yellow, tan, gray, green or brown) and/or eschar (tan, brown or black) in the wound bed.  Date First Assessed/Time First Assessed: 07/04/2020 2000   Location: Buttocks  Location Orientation: Right  Staging: Unstageable - Full thickness tissue loss in which the base of the injury is covered by slough (yellow, tan, gray, green or brown) and/or...  Dressing Type ABD;Gauze (Comment);Moist to dry  --   Dressing Changed;Clean;Intact;Dry  --   Dressing Change Frequency Twice a day  --   State of Healing Non-healing  --   Site / Wound Assessment Brown;Red;Bleeding;Yellow  --   % Wound base Black/Eschar 80%  --   Peri-wound Assessment Maceration  --   Margins Unattached edges (unapproximated)  --   Drainage Amount Minimal  --   Drainage Description Serosanguineous  --   Treatment Cleansed;Packing (Saline gauze);Off loading  --   Pressure Injury 07/15/2020 Sacrum Unstageable - Full thickness tissue loss in which the base of the injury is covered by slough (yellow, tan, gray, green or brown) and/or eschar (tan, brown or black) in the wound bed.  Date First Assessed/Time First Assessed: 07/06/2020 2000   Location: Sacrum  Staging: Unstageable - Full thickness tissue loss in which the base of the injury is covered by slough (yellow, tan, gray, green or brown) and/or eschar (tan, brown or black) i...  Dressing Type ABD;Gauze (Comment);Moist to dry  --   Dressing Changed;Clean;Dry;Intact  --    Dressing Change Frequency Twice a day  --   State of Healing Non-healing  --   Site / Wound Assessment Brown;Bleeding;Red;Yellow  --   Peri-wound Assessment Maceration  --   Margins Unattached edges (unapproximated)  --   Drainage Amount Minimal  --   Drainage Description Serosanguineous  --   Treatment Packing (Saline gauze);Off loading;Cleansed  --   Rica Koyanagi  PTA Acute  Rehabilitation Services Pager      [phone] Office      [phone]

## 2020-07-24 NOTE — Progress Notes (Signed)
Pharmacy Antibiotic Note  Omar Todd is a 69 y.o. male admitted on 06/25/2020 with necrotizing fasciitis and candida tracheitis.  Pharmacy has been consulted for fluconazole dosing. Patient also receiving linezolid and ceftriaxone. Add metronidazole 9/2 since necrotizing infection to cover anaerobes.  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 + watershed stroke per MRI 9/2  Plan:  Fluconazole 800mg  IV x 1 9/3 followed by 400mg  PT daily on 9/4 (adjusted maintenance dose as CrCl > 50 ml/min today)  No need to renally adjust other antibiotics  Follow SCr and CrCl to determine need to increase fluconazole dose to 400mg  daily  Height: 6\' 2"  (188 cm) Weight: 78.9 kg (173 lb 15.1 oz) IBW/kg (Calculated) : 82.2  Temp (24hrs), Avg:100.4 F (38 C), Min:98.3 F (36.8 C), Max:102.8 F (39.3 C)  Recent Labs  Lab 07/19/20 0628 07/19/20 0628 07/19/20 1202 07/19/20 1210 06/29/2020 1000 07/21/20 0406 07/21/20 1139 07/22/20 0836 07/23/20 1033 07/24/20 0353  WBC 23.7*   < >  --   --  28.8* 34.4*  --  40.4* 31.8* 28.3*  CREATININE 1.08   < >   < >  --  1.38* 1.53*  --  1.74* 1.64* 1.55*  LATICACIDVEN  --   --   --  1.5  --   --   --   --   --   --   VANCOTROUGH 26*  --   --   --   --   --   --   --   --   --   VANCORANDOM  --   --   --   --   --   --  23  --   --   --    < > = values in this interval not displayed.    Estimated Creatinine Clearance: 50.2 mL/min (A) (by C-G formula based on SCr of 1.55 mg/dL (H)).    Allergies  Allergen Reactions  . Penicillins Other (See Comments)    Pt. States he "passed out"    Antimicrobials this admission:  8/22 Cipro x 1 8/22 Clinda x 1, resume 8/23 >>8/28 8/22 Vancomycin >> 8/31 8/23 Metronidazole >> 8/23 8/23 Cefepime >>8/27 8/27 Ceftriaxone >>  8/30 Clinda/Gent IV x 1  pre-op 8/31 Zyvox >> 9/1 eraxis>> 9/2 9/3 fluconazole >> 9/2 metronidazole >>   Dose adjustments this admission:  8/27 VT at 05:30 = 13 on 750mg  q8h --> increase to 1g q8h 8/29 VT at 05:30 = 26 on 1g IV q8h and SCr doubled.  Decrease to 1g q12h 8/30 Discontinued, dose off of random levels  Microbiology results:  8/22 COVID: neg 8/22 BCx: NGF 8/22 UCx:  multiple species (F) 8/22 Wound from ulcer cxs: multiple organisms, no staph, no group A strep 8/23 Sacrum abscess: multiple organisms, no staph, no group A strep (F) 8/23 Ischial wound: Strep agalactiae 8/23 AKA stump: rare MRSA, rare GBS (F) 8/23 Inguinal wound: rare Klebsiella oxytoca (pan-sens except ampicillin), rare strep constellatus (sens: levo, vanc)  (F) 8/24 MRSA PCR: positive 8/25 Respiratory panel (RSV/Flu/COVID): all neg 8/29 Respiratory cxt: flu neg, covid neg 8/29 TA: moderate candida albicans F 8/26 & 8/29 & 8/30 covid neg  Thank you for allowing pharmacy to be a part of this patient's care.  Doreene Eland, PharmD, BCPS.   Work Cell: [phone] 07/24/2020 7:47 AM

## 2020-07-24 NOTE — Progress Notes (Signed)
EEG Completed; Results Pending  

## 2020-07-24 NOTE — Progress Notes (Signed)
4 Days Post-Op  Subjective: CC: Seen with hydro. Febrile to 102.8 yesterday. Patient noted to have infarcts on MRI yesterday. Going for MRA of head and neck today.   Objective: Vital signs in last 24 hours: Temp:  [98.3 F (36.8 C)-102.8 F (39.3 C)] 100.9 F (38.3 C) (09/03 0800) Pulse Rate:  [70-107] 101 (09/03 1000) Resp:  [17-33] 33 (09/03 1000) BP: (115-190)/(46-70) 184/70 (09/03 1000) SpO2:  [96 %-100 %] 98 % (09/03 1000) FiO2 (%):  [30 %] 30 % (09/03 0738) Weight:  [78.9 kg] 78.9 kg (09/03 0500) Last BM Date: 07/22/20  Intake/Output from previous day: 09/02 0701 - 09/03 0700 In: 3371.6 [I.V.:45.9; NG/GT:2740; IV Piggyback:465.7] Out: 0355 [Urine:7785; Stool:5] Intake/Output this shift: Total I/O In: 10 [I.V.:10] Out: -   Vent Mode: PRVC FiO2 (%):  [30 %] 30 % Set Rate:  [30 bmp] 30 bmp Vt Set:  [650 mL] 650 mL PEEP:  [5 cmH20] 5 cmH20 Plateau Pressure:  [17 cmH20-23 cmH20] 20 cmH20   PE: Gen: Sedated on the vent Abd: Soft, ND, no rigidity or guarding. +BS. Colostomy with semi formed stool in bag. Red rubber in place.  Wound: Please see pictures below. Wound with minimal bleeding and purulence, necrotic appearing tissue present and bone visible on posterior aspect of the wound; anteriorly wound appears more clean.         Lab Results:  Recent Labs    07/23/20 1033 07/24/20 0353  WBC 31.8* 28.3*  HGB 7.5* 8.2*  HCT 26.0* 28.0*  PLT 415* 459*   BMET Recent Labs    07/23/20 1033 07/24/20 0353  NA 149* 157*  K 3.2* 2.1*  CL 114* 117*  CO2 24 25  GLUCOSE 426* 226*  BUN 137* 150*  CREATININE 1.64* 1.55*  CALCIUM 7.8* 7.8*   PT/INR No results for input(s): LABPROT, INR in the last 72 hours. CMP     Component Value Date/Time   NA 157 (H) 07/24/2020 0353   NA 130 (L) 02/28/2020 1552   K 2.1 (LL) 07/24/2020 0353   CL 117 (H) 07/24/2020 0353   CO2 25 07/24/2020 0353   GLUCOSE 226 (H) 07/24/2020 0353   BUN 150 (H) 07/24/2020 0353    BUN 13 02/28/2020 1552   CREATININE 1.55 (H) 07/24/2020 0353   CREATININE 0.88 04/11/2013 0000   CALCIUM 7.8 (L) 07/24/2020 0353   PROT 4.6 (L) 07/16/2020 1000   PROT 6.6 08/08/2018 1126   ALBUMIN 1.0 (L) 07/14/2020 1000   ALBUMIN 4.4 08/08/2018 1126   AST 34 07/16/2020 1000   ALT 51 (H) 07/18/2020 1000   ALKPHOS 184 (H) 07/19/2020 1000   BILITOT 0.4 07/10/2020 1000   BILITOT <0.2 08/08/2018 1126   GFRNONAA 45 (L) 07/24/2020 0353   GFRNONAA >89 04/11/2013 0000   GFRAA 52 (L) 07/24/2020 0353   GFRAA >89 04/11/2013 0000   Lipase  No results found for: LIPASE     Studies/Results: CT HEAD WO CONTRAST  Result Date: 07/23/2020 CLINICAL DATA:  Mental status change of unknown cause. EXAM: CT HEAD WITHOUT CONTRAST TECHNIQUE: Contiguous axial images were obtained from the base of the skull through the vertex without intravenous contrast. COMPARISON:  None. FINDINGS: Brain: Wedge-shaped area hypoattenuation is noted in the posterior right parietal lobe bordering on the right occipital lobe, involving the gray-white junction, consistent with a recent infarct. Smaller areas of more well-defined hypoattenuation white matter, which are likely due to chronic lacunar infarcts. The ventricles are normal in configuration. Mild ventricular  and sulcal enlargement reflecting age-appropriate volume loss. Bilateral periventricular white matter hypoattenuation is present consistent with mild chronic microvascular ischemic change. There are no parenchymal masses or significant mass effect. No midline shift. There are no extra-axial masses or abnormal fluid collections. There is no intracranial hemorrhage. Vascular: No hyperdense vessel or unexpected calcification. Skull: Normal. Negative for fracture or focal lesion. Sinuses/Orbits: Globes and orbits are unremarkable. Small amount of dependent fluid in the right maxillary sinus. Mild sphenoid sinus mucosal thickening. Fluid attenuation noted mastoid air cells partly  filling the middle ear cavities in this intubated patient. Other: None. IMPRESSION: 1. Recent infarct of the posterior right parietal lobe bordering on the right occipital lobe, approximately 3 cm in greatest dimension. This appears subacute. 2. No other convincing recent infarcts. Probable all white matter lacunar infarcts and mild chronic microvascular ischemic change. 3. No intracranial hemorrhage.  No hydrocephalus. Electronically Signed   By: Lajean Manes M.D.   On: 07/23/2020 18:13   MR BRAIN W WO CONTRAST  Result Date: 07/23/2020 CLINICAL DATA:  Stroke follow-up EXAM: MRI HEAD WITHOUT AND WITH CONTRAST TECHNIQUE: Multiplanar, multiecho pulse sequences of the brain and surrounding structures were obtained without and with intravenous contrast. CONTRAST:  15mL GADAVIST GADOBUTROL 1 MMOL/ML IV SOLN COMPARISON:  None. FINDINGS: Brain: There are bilateral occipital lobe infarcts and numerous infarcts within the deep watershed zones. There also numerous bilateral cerebellar infarcts. Early confluent hyperintense T2-weighted signal of the periventricular and deep white matter. There is generalized atrophy without lobar predilection. No chronic microhemorrhage. Normal midline structures. There are areas of post-ischemic leptomeningeal contrast enhancement within the watershed zones. Vascular: Normal flow voids. Skull and upper cervical spine: Normal marrow signal. Sinuses/Orbits: Negative. Other: None. IMPRESSION: 1. Bilateral occipital lobe, deep watershed zone and cerebellar infarcts. No acute hemorrhage or mass effect. 2. Generalized atrophy and chronic microvascular ischemia. Electronically Signed   By: Ulyses Jarred M.D.   On: 07/23/2020 22:57   DG Chest Port 1 View  Result Date: 07/23/2020 CLINICAL DATA:  Respiratory failure EXAM: PORTABLE CHEST 1 VIEW.  Patient is rotated. COMPARISON:  Chest x-ray 07/18/2020, chest x-ray 07/19/2020 FINDINGS: Endotracheal tube in similar position with tip terminating  approximately 6 cm above the carina. Enteric tube again noted coursing below the diaphragm with tip and inside port collimated off view. Right peripherally inserted central venous catheter again noted with tip overlying the expected region of the superior cavoatrial junction. The heart size and mediastinal contours are within normal limits. Redemonstration of aortic arch calcifications. No focal consolidation or pulmonary edema. Persistent at least small volume left pleural effusion. Interval improvement of possible residual trace right pleural effusion. No pneumothorax. The visualized skeletal structures are unremarkable. IMPRESSION: Persistent small volume left pleural effusion. Interval improvement of possible residual trace right pleural effusion. Otherwise no acute cardiopulmonary abnormality. Lines and tubes in similar position. Electronically Signed   By: Iven Finn M.D.   On: 07/23/2020 09:31    Anti-infectives: Anti-infectives (From admission, onward)   Start     Dose/Rate Route Frequency Ordered Stop   07/25/20 1000  fluconazole (DIFLUCAN) tablet 200 mg  Status:  Discontinued        200 mg Per Tube Daily 07/23/20 0955 07/24/20 0746   07/25/20 1000  fluconazole (DIFLUCAN) tablet 400 mg        400 mg Per Tube Daily 07/24/20 0746     07/24/20 0800  fluconazole (DIFLUCAN) IVPB 800 mg        800 mg 100 mL/hr  over 240 Minutes Intravenous  Once 07/23/20 0955     07/23/20 1000  anidulafungin (ERAXIS) 100 mg in sodium chloride 0.9 % 100 mL IVPB  Status:  Discontinued       "And" Linked Group Details   100 mg 78 mL/hr over 100 Minutes Intravenous Every 24 hours 07/22/20 1148 07/23/20 0943   07/23/20 1000  metroNIDAZOLE (FLAGYL) IVPB 500 mg        500 mg 100 mL/hr over 60 Minutes Intravenous Every 8 hours 07/23/20 0943     07/22/20 1400  anidulafungin (ERAXIS) 200 mg in sodium chloride 0.9 % 200 mL IVPB  Status:  Discontinued       "And" Linked Group Details   200 mg 78 mL/hr over 200  Minutes Intravenous Every 24 hours 07/22/20 1148 07/23/20 0943   07/21/20 2200  linezolid (ZYVOX) tablet 600 mg        600 mg Per Tube Every 12 hours 07/21/20 1341     07/07/2020 2131  vancomycin variable dose per unstable renal function (pharmacist dosing)  Status:  Discontinued         Does not apply See admin instructions 06/26/2020 2131 07/21/20 1340   07/11/2020 1800  vancomycin (VANCOCIN) IVPB 1000 mg/200 mL premix  Status:  Discontinued        1,000 mg 200 mL/hr over 60 Minutes Intravenous Every 12 hours 07/19/20 1446 06/23/2020 0823   07/08/2020 1000  clindamycin (CLEOCIN) IVPB 900 mg       "And" Linked Group Details   900 mg 100 mL/hr over 30 Minutes Intravenous 60 min pre-op 07/19/20 0854 07/17/2020 1537   07/16/2020 1000  gentamicin (GARAMYCIN) 460 mg in dextrose 5 % 100 mL IVPB       "And" Linked Group Details   5 mg/kg  91.9 kg 111.5 mL/hr over 60 Minutes Intravenous 60 min pre-op 07/19/20 0854 06/28/2020 1900   07/01/2020 1000  vancomycin (VANCOCIN) IVPB 1000 mg/200 mL premix  Status:  Discontinued        1,000 mg 200 mL/hr over 60 Minutes Intravenous Every 12 hours 07/18/2020 0823 06/26/2020 2018   07/19/20 1400  neomycin (MYCIFRADIN) tablet 1,000 mg       "And" Linked Group Details   1,000 mg Oral 3 times per day 07/19/20 0854 07/19/20 2335   07/19/20 1400  metroNIDAZOLE (FLAGYL) tablet 1,000 mg       "And" Linked Group Details   1,000 mg Oral 3 times per day 07/19/20 0854 07/19/20 2336   07/17/20 1600  cefTRIAXone (ROCEPHIN) 2 g in sodium chloride 0.9 % 100 mL IVPB        2 g 200 mL/hr over 30 Minutes Intravenous Every 24 hours 07/17/20 1528     07/17/20 1400  vancomycin (VANCOCIN) IVPB 1000 mg/200 mL premix  Status:  Discontinued        1,000 mg 200 mL/hr over 60 Minutes Intravenous Every 8 hours 07/17/20 0944 07/19/20 1446   06/26/2020 1600  ceFEPIme (MAXIPIME) 2 g in sodium chloride 0.9 % 100 mL IVPB  Status:  Discontinued        2 g 200 mL/hr over 30 Minutes Intravenous Every 8 hours  07/21/2020 1537 07/17/20 1528   07/02/2020 1500  clindamycin (CLEOCIN) IVPB 600 mg        600 mg 100 mL/hr over 30 Minutes Intravenous Every 6 hours 07/10/2020 1337 07/18/20 0553   07/06/2020 0600  vancomycin (VANCOREADY) IVPB 750 mg/150 mL  Status:  Discontinued  750 mg 150 mL/hr over 60 Minutes Intravenous Every 8 hours 07/17/2020 2310 07/17/20 0944   07/12/2020 0200  metroNIDAZOLE (FLAGYL) IVPB 500 mg  Status:  Discontinued        500 mg 100 mL/hr over 60 Minutes Intravenous Every 8 hours 07/11/2020 2220 06/27/2020 1337   07/02/2020 2315  ceFEPIme (MAXIPIME) 2 g in sodium chloride 0.9 % 100 mL IVPB  Status:  Discontinued        2 g 200 mL/hr over 30 Minutes Intravenous Every 8 hours 06/25/2020 2310 07/13/2020 1537   07/09/2020 2000  vancomycin (VANCOREADY) IVPB 1500 mg/300 mL        1,500 mg 150 mL/hr over 120 Minutes Intravenous  Once 07/17/2020 1834 06/30/2020 2326   07/01/2020 1800  vancomycin (VANCOCIN) IVPB 1000 mg/200 mL premix  Status:  Discontinued        1,000 mg 200 mL/hr over 60 Minutes Intravenous  Once 07/04/2020 1757 06/24/2020 1834   07/19/2020 1800  clindamycin (CLEOCIN) IVPB 600 mg        600 mg 100 mL/hr over 30 Minutes Intravenous  Once 07/08/2020 1759 06/27/2020 1913   06/23/2020 1800  ciprofloxacin (CIPRO) IVPB 400 mg        400 mg 200 mL/hr over 60 Minutes Intravenous  Once 07/03/2020 1759 07/17/2020 2039       Assessment/Plan Sepsis with necrotizing fasciitis/hypotension Ventilator dependent respiratory failure Anemia COPD Type 2 diabetes with neuropathy Hypertension COVID-19 exposure - family member tested positive,patient tested negative, off precautions Resp Cx - Moderate Candida albicans CVA - Noted on MRI yesterday. Primary team has ordered MRA of head and neck.    Right AKA 7/30-Dr. Sharol Given Right above-the-knee amputation incision infection S/p debridement by Dr. Constance Haw 07/11/2020 - management per Dr. 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 Constance Haw 07/13/20 S/p debridement by Dr. Rosendo Gros and Dr. Diona Fanti 07/15/20 S/p debridement by Dr. Rosendo Gros 07/12/2020 S/p debridement and laparoscopic diverting loop colostomy 06/26/2020 Dr. Kieth Brightly - Having ostomy output. Keep red rubber in place for 7 days  - continue hydrotherapy. Continue BID wet to dry dressing changes. Add santyl  - Cx from ischial woundwith strep agalactiae - WBCdown to 28.3. Febrile to 102.8 in the last 24 hours.  - Continue to off-load pressure as able. Will order air mattress bed - We will see again Tuesday    FEN:TF, IVF, NPO TM:[id number] on rocephin, Fluconazole, Flagyl and Linezolid DVT:SCD's   LOS: 12 days    Jillyn Ledger , [facility] 07/24/2020, 10:51 AM Please see Amion for pager number during day hours 7:00am-4:30pm

## 2020-07-25 DIAGNOSIS — I639 Cerebral infarction, unspecified: Secondary | ICD-10-CM

## 2020-07-25 LAB — BASIC METABOLIC PANEL
Anion gap: 17 — ABNORMAL HIGH (ref 5–15)
Anion gap: 14 (ref 5–15)
Anion gap: 19 — ABNORMAL HIGH (ref 5–15)
BUN: 159 mg/dL — ABNORMAL HIGH (ref 8–23)
BUN: 166 mg/dL — ABNORMAL HIGH (ref 8–23)
BUN: 153 mg/dL — ABNORMAL HIGH (ref 8–23)
CO2: 26 mmol/L (ref 22–32)
CO2: 25 mmol/L (ref 22–32)
CO2: 23 mmol/L (ref 22–32)
Calcium: 7.4 mg/dL — ABNORMAL LOW (ref 8.9–10.3)
Calcium: 7.2 mg/dL — ABNORMAL LOW (ref 8.9–10.3)
Calcium: 6.7 mg/dL — ABNORMAL LOW (ref 8.9–10.3)
Chloride: 120 mmol/L — ABNORMAL HIGH (ref 98–111)
Chloride: 126 mmol/L — ABNORMAL HIGH (ref 98–111)
Chloride: 125 mmol/L — ABNORMAL HIGH (ref 98–111)
Creatinine, Ser: 1.55 mg/dL — ABNORMAL HIGH (ref 0.61–1.24)
Creatinine, Ser: 1.83 mg/dL — ABNORMAL HIGH (ref 0.61–1.24)
Creatinine, Ser: 1.89 mg/dL — ABNORMAL HIGH (ref 0.61–1.24)
GFR calc Af Amer: 52 mL/min — ABNORMAL LOW (ref >60)
GFR calc Af Amer: 43 mL/min — ABNORMAL LOW (ref >60)
GFR calc Af Amer: 41 mL/min — ABNORMAL LOW (ref >60)
GFR calc non Af Amer: 45 mL/min — ABNORMAL LOW (ref >60)
GFR calc non Af Amer: 37 mL/min — ABNORMAL LOW (ref >60)
GFR calc non Af Amer: 35 mL/min — ABNORMAL LOW (ref >60)
Glucose, Bld: 178 mg/dL — ABNORMAL HIGH (ref 70–99)
Glucose, Bld: 230 mg/dL — ABNORMAL HIGH (ref 70–99)
Glucose, Bld: 218 mg/dL — ABNORMAL HIGH (ref 70–99)
Potassium: <2 mmol/L — CL (ref 3.5–5.1)
Potassium: 3.5 mmol/L (ref 3.5–5.1)
Potassium: 2.4 mmol/L — CL (ref 3.5–5.1)
Sodium: 163 mmol/L (ref 135–145)
Sodium: 165 mmol/L (ref 135–145)
Sodium: 167 mmol/L (ref 135–145)

## 2020-07-25 LAB — CBC
HCT: 29.4 % — ABNORMAL LOW (ref 39.0–52.0)
Hemoglobin: 8.5 g/dL — ABNORMAL LOW (ref 13.0–17.0)
MCH: 24.4 pg — ABNORMAL LOW (ref 26.0–34.0)
MCHC: 28.9 g/dL — ABNORMAL LOW (ref 30.0–36.0)
MCV: 84.2 fL (ref 80.0–100.0)
Platelets: 429 10*3/uL — ABNORMAL HIGH (ref 150–400)
RBC: 3.49 MIL/uL — ABNORMAL LOW (ref 4.22–5.81)
RDW: 20.8 % — ABNORMAL HIGH (ref 11.5–15.5)
WBC: 22 10*3/uL — ABNORMAL HIGH (ref 4.0–10.5)
nRBC: 0.1 % (ref 0.0–0.2)

## 2020-07-25 LAB — GLUCOSE, CAPILLARY
Glucose-Capillary: 175 mg/dL — ABNORMAL HIGH (ref 70–99)
Glucose-Capillary: 68 mg/dL — ABNORMAL LOW (ref 70–99)
Glucose-Capillary: 70 mg/dL (ref 70–99)
Glucose-Capillary: 87 mg/dL (ref 70–99)
Glucose-Capillary: 168 mg/dL — ABNORMAL HIGH (ref 70–99)
Glucose-Capillary: 181 mg/dL — ABNORMAL HIGH (ref 70–99)
Glucose-Capillary: 248 mg/dL — ABNORMAL HIGH (ref 70–99)
Glucose-Capillary: 180 mg/dL — ABNORMAL HIGH (ref 70–99)

## 2020-07-25 LAB — VITAMIN B12: Vitamin B-12: 681 pg/mL (ref 180–914)

## 2020-07-25 MED ORDER — POTASSIUM CHLORIDE 20 MEQ/15ML (10%) PO SOLN
40.0000 meq | Freq: Once | ORAL | Status: AC
  Administered 2020-07-25: 40 meq
  Filled 2020-07-25: qty 30

## 2020-07-25 MED ORDER — INSULIN GLARGINE 100 UNIT/ML ~~LOC~~ SOLN
15.0000 [IU] | Freq: Two times a day (BID) | SUBCUTANEOUS | Status: DC
  Administered 2020-07-25 – 2020-07-26 (×2): 15 [IU] via SUBCUTANEOUS
  Filled 2020-07-25 (×3): qty 0.15

## 2020-07-25 MED ORDER — POTASSIUM CHLORIDE 10 MEQ/50ML IV SOLN
10.0000 meq | INTRAVENOUS | Status: AC
  Administered 2020-07-25 – 2020-07-26 (×4): 10 meq via INTRAVENOUS
  Filled 2020-07-25 (×4): qty 50

## 2020-07-25 MED ORDER — FREE WATER
300.0000 mL | Status: DC
  Administered 2020-07-25 – 2020-07-26 (×11): 300 mL

## 2020-07-25 MED ORDER — CHLORHEXIDINE GLUCONATE 0.12% ORAL RINSE (MEDLINE KIT)
15.0000 mL | Freq: Two times a day (BID) | OROMUCOSAL | Status: DC
  Administered 2020-07-25: 15 mL via OROMUCOSAL

## 2020-07-25 MED ORDER — DEXTROSE 50 % IV SOLN
INTRAVENOUS | Status: AC
  Administered 2020-07-25: 50 mL
  Filled 2020-07-25: qty 50

## 2020-07-25 MED ORDER — INSULIN GLARGINE 100 UNIT/ML ~~LOC~~ SOLN
10.0000 [IU] | Freq: Two times a day (BID) | SUBCUTANEOUS | Status: DC
  Filled 2020-07-25 (×2): qty 0.1

## 2020-07-25 MED ORDER — POTASSIUM CHLORIDE 10 MEQ/50ML IV SOLN
10.0000 meq | INTRAVENOUS | Status: AC
  Administered 2020-07-25 (×6): 10 meq via INTRAVENOUS
  Filled 2020-07-25 (×2): qty 50

## 2020-07-25 MED ORDER — FREE WATER
200.0000 mL | Status: DC
  Administered 2020-07-25 (×8): 200 mL

## 2020-07-25 MED ORDER — ORAL CARE MOUTH RINSE
15.0000 mL | OROMUCOSAL | Status: DC
  Administered 2020-07-25 – 2020-07-26 (×11): 15 mL via OROMUCOSAL

## 2020-07-25 MED ORDER — DEXTROSE 5 % IV SOLN: INTRAVENOUS | Status: AC

## 2020-07-25 NOTE — Progress Notes (Signed)
CRITICAL VALUE ALERT  Critical Value:  NA 165  Date & Time Notied:  07/25/20 1300  Provider Notified: Ander Slade  Orders Received/Actions taken: Dextrose 5% Q4 BMPs

## 2020-07-25 NOTE — Progress Notes (Signed)
CRITICAL VALUE ALERT  Critical Value: Na+ 167 and K+ 2.4  Date & Time Notied: 07/25/20 @ 19:20  Provider Notified: Elink notified by day shift RN  Orders Received/Actions taken: Awaiting replacement orders

## 2020-07-25 NOTE — Progress Notes (Signed)
Spoke with and updated patient's Sister Inez Catalina  Unable to make a decision at the present time as she has some personal medical issues to deal with it as well   Discussed his wounds Discussed his stroke He is not waking up despite not being on any sedation  I do not believe he will survive this process  He will remain a full code and continue full scope of care

## 2020-07-25 NOTE — Progress Notes (Signed)
NAME:  TICO CROTTEAU, MRN:  [id number], DOB:  11-Dec-1950, LOS: 34 ADMISSION DATE:  07/05/2020, CONSULTATION DATE:  07/14/20 REFERRING MD:  [name] trnasfer, CHIEF COMPLAINT:  [name] at [place] History    30 pack current smoker with DM, COPD Nos, BP, lipid, GERD. 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 At some pont also has stage 2 sacral decub "Size of a quarter"- described at time of this discharge. Discharged to ? Rehab and then signed out ? AMA from rehab.   [facility] to [facility] 07/03/2020 with sepsis syndrome(fever + SIRS with lactate 2.1) with growing. Rt buttock ulcer noted to be stage 3 large wth fecal soiling but R AKA stump incision well healed but  By 06/21/2020 purulent drainage noted. CT pelvis c/w necrotizing fasciitis (with sub cut gas) in right perineal soft tissue and bedside exam was concerning for crepitus and concern for Fournier gangrene. Patient noticed to be disoriented by 07/08/2020  On 07/07/2020 Hervey Ard excisional debridement of right buttock, right inguinal region, and right scrotal skin measuring (right buttock / right inguinal wound 20cmX10cmX4cm and right scrotal skin 15cmX8cmX2cm); Drainage and packing of Right AKA wound infection  -. 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 Concern for need for repeated debridement and urology involvement and patient transferred to [facility] from [facility] on 07/14/20. Upon arrival getting PRBC for hgb < 7gm% and sedatedh diprivan gtt and fent gtt on ventilator needing neo for bp support.    Decision maker:  he has a wife at [facility] with dementia and estranged children he has not seen since they were babies.Taeshawn Helfman [phone]  She is his  closest relative and decision maker. She does not have health care power of attorney.    Past Medical History     has a past medical history of COPD (chronic obstructive pulmonary disease) ([place]), Diabetes mellitus without complication ([place]), Gangrene of toe of right foot ([place]), GERD (gastroesophageal reflux disease), Hyperlipidemia, and Hypertension.   reports that he has been smoking cigarettes. He has a 30.00 pack-year smoking history. He has never used smokeless tobacco.   has a past surgical history that includes Thumb surgery; Esophagogastroduodenoscopy (N/A, 03/13/2020); biopsy (03/13/2020); Flexible sigmoidoscopy (N/A, 05/06/2020); Amputation (Right, 05/17/2020); Application if wound vac (Right, 05/17/2020); Amputation (Right, 06/19/2020); Incision and drainage of wound (Right, 07/02/2020); Incision and drainage (Right, 07/21/2020); Incision and drainage abscess (N/A, 06/23/2020); Wound debridement (N/A, 07/03/2020); laparoscopy (N/A, 07/10/2020); and Incision and drainage abscess (N/A, 07/19/2020).   Significant Hospital Events   07/10/2020 - [place] admit 07/14/20  - transfer to [facility] 8/25 - s. I and D and debridement of Rt Gluteal Nec Fasc by [facility] and ->   Inspection of wound, debridement of right testicle/cord, scrotal wound edge ( excised tissue approximately 2 x 4 cm) by Urology. On neo gtt, diprivan gtt, fent gtt . On vent 40% . Not on TF (yet). Ortho Dr Sharol Given recmmended -> packing the  R AKA wound daily . Seen by Dr Gloriann Loan of Urology - currently R testicle exposed but viable tissue  8/26 = PRBC < 7 and 1 u prbc ordered. On vent. BAck to OR today, . Exposure to sister with  covid + . Sister Inez Catalina the DPOA reported that she is covid positive 8/.25/2 but she has not been at bedside. Later the visitor - sster. [name] is also covid positive. APteitn repeat covid - negative  8/27 - Followed commands on [facility] 8/27 am Remains on fent gtt at 125 mcg, diprivan gtt at 30 mcg and neo gtt at 75 mcg via PICC  line.  T max 101 WBC 16 HGB 7.6 Platelets 170,000 Lactate 1.3 Potassium 3.9 Mag 2 + 16 L since admission    8/28 -  +18L since admit. Looking more voume overloaded. Increased fio2 50% on vent. CXR wet. Unable to wean. On fent gtt, diprivan gtt, neo gtt and LR at 100cc/h  9/1 Sedate -Attempts to open his eyes to voice  9/2-noted not to be as responsive, blood gases obtained on ventilator adjusted, with no improvement in status a CT scan of the head was obtained showing evidence of CVA This was followed with an MRI showing multiple infarcts  9/4 No overnight events, did reach out to next of kin on 9/3-unable to make any goals of care changes  Consults:  8/24 - pccm  Procedures:  8/23 -  On 07/01/2020 Hervey Ard excisional debridement of right buttock, right inguinal region, and right scrotal skin measuring (right buttock / right inguinal wound 20cmX10cmX4cm and right scrotal skin 15cmX8cmX2cm); Drainage and packing of Right AKA wound infection ETT 8/23  RUE picc 8/24  Significant Diagnostic Tests:  cxr with basal atelectasis, left effusion 9/2-reviewed by myself  Micro Data:   Microbiology results: 8/22 BCx:  8/22 UCx:   8/22 Wound cx 8/23 - wound - multiple org. None predominant 8/23 wound inguinal -  KLEBSIELLA OXYTOCA     Organism ID, Bacteria STREPTOCOCCUS CONSTELLATUS    xxx 8/26 covid pcr - neg Covid negative  Antimicrobials:  Antimicrobials this admission: Cipro IV 8/22 >>8/22 Clinda 8/22 >8/22, 8/23 > 8/28 Vanc 8/22 >8/22, 8/22 >8/27, 8/27 > Cefepime 8/22 >8/26, 8/26 > 8/27 Ceftriaxone 8/27 >> xxxx Clinda 8/30 preop Gentamicon 8/30 preop Flagyl 3 dose 8/30 preop Neomycin oral 8/29 3 doses preop Eraxis 9/1-9/2 linezolid 8/31>> Diflucan 9/2>>  Interim history/subjective:   8/29 - stil volume overloaded at +19.9L since admit. 40% fio2, Pn fent gtt. Off diprivan and neo. On TF.  CCS plans for 8/30 -> diverting loop colostomy for fecal diversion given  massive soft tissue infection and necrosis limited mobility with amputation 8/30-no overnight events, off pressors 9/1-no overnight events 9/2 less responsive today 9/3-new CVA-being evaluated 9/4 extensive CVA, patient unresponsive  Objective   Blood pressure (!) 95/39, pulse 80, temperature (!) 102.8 F (39.3 C), temperature source Oral, resp. rate (!) 30, height 6\' 2"  (1.88 m), weight 76.4 kg, SpO2 99 %. CVP:  [0 mmHg-3 mmHg] 1 mmHg  Vent Mode: PRVC FiO2 (%):  [30 %] 30 % Set Rate:  [30 bmp] 30 bmp Vt Set:  [650 mL] 650 mL PEEP:  [5 cmH20] 5 cmH20 Plateau Pressure:  [17 cmH20-19 cmH20] 19 cmH20   Intake/Output Summary (Last 24 hours) at 07/25/2020 0834 Last data filed at 07/25/2020 0300 Gross per 24 hour  Intake 1916.2 ml  Output 6055 ml  Net -4138.8 ml   Filed Weights   07/23/20 0403 07/24/20 0500 07/25/20 0500  Weight: 88.6 kg 78.9 kg 76.4 kg   General Appearance: Unresponsive Head: Normocephalic, atraumatic Eyes: Pupils 73mm Throat:   endotracheal tube in place Neck:  Supple, no JVD Lungs: Clear breath sounds bilaterally Heart: S1-S2  appreciated Abdomen: Soft, no organomegaly Skin: Warm and dry  No corneals, no gag, no response to deep tracheal suction Breathing above the vent  Resolved Hospital Problem list   Circulatory shock 8/28  Assessment & Plan:    Cerebrovascular accident -Likely secondary to MRSA bacteremia -Watershed infarcts -Comatose  Acute hypoxic respiratory failure -Continue ventilator support -VAP bundle in place  Fournier's gangrene Post debridement Post diverting colostomy -On antibiotics-linezolid, ceftriaxone, Eraxis  Electrolyte derangement Hypernatremia -Free water Hypokalemia -Been repleted  Chronic obstructive pulmonary disease -Bronchodilators  Hypertension -Monitor closely -Treat only if severe hypertension  Acute kidney injury -Cautious diuresis  Anemia -Transfuse per protocol   New stroke with  encephalopathy Prognosis is very poor Will reach out to patient sister was the only next of kin available-has been reaching out to her on a daily basis and still waiting for her to talk to other family members to make decisions regarding goals of care  Best practice:  Diet: Tube feeds Pain/Anxiety/Delirium protocol (if indicated): sedation protocol VAP protocol (if indicated): Bundle in place DVT prophylaxis: Lovenox/Heparin GI prophylaxis: ppi Glucose control: ssi Mobility: bed rest Code Status: full code  Family Communication: Sister Townsend Cudworth main decision makder  [phone] -last on 9 12/2019  updated 07/18/20  Over phone. - she had questions about why being asked to set up password as new process. Offered to refer to office of patient experience. However, she wanted bedside RN to give her  A call  Disposition: [facility]  The patient is critically ill with multiple organ systems failure and requires high complexity decision making for assessment and support, frequent evaluation and titration of therapies, application of advanced monitoring technologies and extensive interpretation of multiple databases. Critical Care Time devoted to patient care services described in this note independent of APP/resident time (if applicable)  is 32 minutes.   Sherrilyn Rist MD [facility] Personal pager: [phone] If unanswered, please page CCM On-call: [phone]

## 2020-07-25 NOTE — Progress Notes (Signed)
Pt. Dressing changed today but pt. Does need to continue hydrotherapy. RN recommends Physical therapy talk with nurse tomorrow to confirm medical stability.

## 2020-07-25 NOTE — Progress Notes (Signed)
5 Days Post-Op  Subjective: CC: Still febrile.  Sedation off and not responsive.    Objective: Vital signs in last 24 hours: Temp:  [97.1 F (36.2 C)-102.8 F (39.3 C)] 102.8 F (39.3 C) (09/04 0757) Pulse Rate:  [80-107] 80 (09/04 0801) Resp:  [26-33] 30 (09/04 0801) BP: (95-190)/(39-118) 95/39 (09/04 0801) SpO2:  [97 %-100 %] 99 % (09/04 0801) FiO2 (%):  [30 %] 30 % (09/04 0801) Weight:  [76.4 kg] 76.4 kg (09/04 0500) Last BM Date: 07/24/20  Intake/Output from previous day: 09/03 0701 - 09/04 0700 In: 2106.1 [I.V.:10; NG/GT:1152.5; IV Piggyback:943.6] Out: 6405 [Urine:6375; Stool:30] Intake/Output this shift: No intake/output data recorded.  Vent Mode: PRVC FiO2 (%):  [30 %] 30 % Set Rate:  [30 bmp] 30 bmp Vt Set:  [650 mL] 650 mL PEEP:  [5 cmH20] 5 cmH20 Plateau Pressure:  [17 cmH20-19 cmH20] 19 cmH20   PE: Gen: unresponsive Abd: Soft, ND, no rigidity or guarding. +BS. Colostomy with semi formed stool in bag. Red rubber in place.  Wound: Please see pictures from [facility] note 07/24/2017. Wound with minimal bleeding and purulence, necrotic appearing tissue present and bone visible on posterior aspect of the wound; anteriorly wound appears more clean.         Lab Results:  Recent Labs    07/24/20 0353 07/25/20 0425  WBC 28.3* 22.0*  HGB 8.2* 8.5*  HCT 28.0* 29.4*  PLT 459* 429*   BMET Recent Labs    07/24/20 0353 07/25/20 0425  NA 157* 163*  K 2.1* <2.0*  CL 117* 120*  CO2 25 26  GLUCOSE 226* 178*  BUN 150* 159*  CREATININE 1.55* 1.55*  CALCIUM 7.8* 7.4*   PT/INR No results for input(s): LABPROT, INR in the last 72 hours. CMP     Component Value Date/Time   NA 163 (HH) 07/25/2020 0425   NA 130 (L) 02/28/2020 1552   K <2.0 (LL) 07/25/2020 0425   CL 120 (H) 07/25/2020 0425   CO2 26 07/25/2020 0425   GLUCOSE 178 (H) 07/25/2020 0425   BUN 159 (H) 07/25/2020 0425   BUN 13 02/28/2020 1552   CREATININE 1.55 (H) 07/25/2020 0425    CREATININE 0.88 04/11/2013 0000   CALCIUM 7.4 (L) 07/25/2020 0425   PROT 4.6 (L) 06/29/2020 1000   PROT 6.6 08/08/2018 1126   ALBUMIN 1.0 (L) 07/12/2020 1000   ALBUMIN 4.4 08/08/2018 1126   AST 34 07/16/2020 1000   ALT 51 (H) 06/30/2020 1000   ALKPHOS 184 (H) 07/01/2020 1000   BILITOT 0.4 07/02/2020 1000   BILITOT <0.2 08/08/2018 1126   GFRNONAA 45 (L) 07/25/2020 0425   GFRNONAA >89 04/11/2013 0000   GFRAA 52 (L) 07/25/2020 0425   GFRAA >89 04/11/2013 0000   Lipase  No results found for: LIPASE     Studies/Results: CT HEAD WO CONTRAST  Result Date: 07/23/2020 CLINICAL DATA:  Mental status change of unknown cause. EXAM: CT HEAD WITHOUT CONTRAST TECHNIQUE: Contiguous axial images were obtained from the base of the skull through the vertex without intravenous contrast. COMPARISON:  None. FINDINGS: Brain: Wedge-shaped area hypoattenuation is noted in the posterior right parietal lobe bordering on the right occipital lobe, involving the gray-white junction, consistent with a recent infarct. Smaller areas of more well-defined hypoattenuation white matter, which are likely due to chronic lacunar infarcts. The ventricles are normal in configuration. Mild ventricular and sulcal enlargement reflecting age-appropriate volume loss. Bilateral periventricular white matter hypoattenuation is present consistent with mild chronic  microvascular ischemic change. There are no parenchymal masses or significant mass effect. No midline shift. There are no extra-axial masses or abnormal fluid collections. There is no intracranial hemorrhage. Vascular: No hyperdense vessel or unexpected calcification. Skull: Normal. Negative for fracture or focal lesion. Sinuses/Orbits: Globes and orbits are unremarkable. Small amount of dependent fluid in the right maxillary sinus. Mild sphenoid sinus mucosal thickening. Fluid attenuation noted mastoid air cells partly filling the middle ear cavities in this intubated patient.  Other: None. IMPRESSION: 1. Recent infarct of the posterior right parietal lobe bordering on the right occipital lobe, approximately 3 cm in greatest dimension. This appears subacute. 2. No other convincing recent infarcts. Probable all white matter lacunar infarcts and mild chronic microvascular ischemic change. 3. No intracranial hemorrhage.  No hydrocephalus. Electronically Signed   By: Lajean Manes M.D.   On: 07/23/2020 18:13   MR ANGIO HEAD WO CONTRAST  Result Date: 07/24/2020 CLINICAL DATA:  Follow-up stroke. Cerebellar and occipital infarctions. Cerebral hemispheric watershed infarctions. EXAM: MRA NECK WITHOUT  CONTRAST MRA HEAD WITHOUT CONTRAST TECHNIQUE: Multiplanar and multiecho pulse sequences of the neck were obtained without intravenous contrast. Angiographic images of the neck were obtained using MRA technique without intravenous contast.; Angiographic images of the Circle of Willis were obtained using MRA technique without intravenous contrast. COMPARISON:  MRI 07/23/2020 FINDINGS: MRA NECK FINDINGS Both common carotid arteries show antegrade flow to the bifurcation regions. Mild atherosclerotic plaque at both carotid bifurcations. No measurable stenosis of the ICA on the right. 30% narrowing of the proximal ICA bulb on the left. Both vertebral arteries show antegrade flow, with the right being dominant. MRA HEAD FINDINGS Both internal carotid arteries are widely patent through the skull base and siphon regions. The anterior and middle cerebral vessels are patent without proximal stenosis, aneurysm or vascular malformation. No large or medium vessel occlusion. Distal branch vessels show some atherosclerotic irregularity. Dominant right vertebral artery widely patent to the basilar. Non dominant left vertebral artery terminates in PICA, except for a tiny contribution to the basilar. No basilar stenosis. Posterior circulation branch vessels are normal. Left PCA takes fetal origin from the anterior  circulation. Flow is visible in both posteroinferior cerebellar arteries, both superior cerebellar arteries and both posterior cerebral arteries. IMPRESSION: 1. Mild atherosclerotic plaque at both carotid bifurcations. 30% narrowing of the proximal ICA bulb on the left. No measurable stenosis of the right ICA. 2. No intracranial large or medium vessel occlusion or correctable proximal stenosis. Distal vessel atherosclerotic irregularity. Electronically Signed   By: Nelson Chimes M.D.   On: 07/24/2020 12:34   MR ANGIO NECK WO CONTRAST  Result Date: 07/24/2020 CLINICAL DATA:  Follow-up stroke. Cerebellar and occipital infarctions. Cerebral hemispheric watershed infarctions. EXAM: MRA NECK WITHOUT  CONTRAST MRA HEAD WITHOUT CONTRAST TECHNIQUE: Multiplanar and multiecho pulse sequences of the neck were obtained without intravenous contrast. Angiographic images of the neck were obtained using MRA technique without intravenous contast.; Angiographic images of the Circle of Willis were obtained using MRA technique without intravenous contrast. COMPARISON:  MRI 07/23/2020 FINDINGS: MRA NECK FINDINGS Both common carotid arteries show antegrade flow to the bifurcation regions. Mild atherosclerotic plaque at both carotid bifurcations. No measurable stenosis of the ICA on the right. 30% narrowing of the proximal ICA bulb on the left. Both vertebral arteries show antegrade flow, with the right being dominant. MRA HEAD FINDINGS Both internal carotid arteries are widely patent through the skull base and siphon regions. The anterior and middle cerebral vessels are patent without  proximal stenosis, aneurysm or vascular malformation. No large or medium vessel occlusion. Distal branch vessels show some atherosclerotic irregularity. Dominant right vertebral artery widely patent to the basilar. Non dominant left vertebral artery terminates in PICA, except for a tiny contribution to the basilar. No basilar stenosis. Posterior  circulation branch vessels are normal. Left PCA takes fetal origin from the anterior circulation. Flow is visible in both posteroinferior cerebellar arteries, both superior cerebellar arteries and both posterior cerebral arteries. IMPRESSION: 1. Mild atherosclerotic plaque at both carotid bifurcations. 30% narrowing of the proximal ICA bulb on the left. No measurable stenosis of the right ICA. 2. No intracranial large or medium vessel occlusion or correctable proximal stenosis. Distal vessel atherosclerotic irregularity. Electronically Signed   By: Nelson Chimes M.D.   On: 07/24/2020 12:34   MR BRAIN W WO CONTRAST  Result Date: 07/23/2020 CLINICAL DATA:  Stroke follow-up EXAM: MRI HEAD WITHOUT AND WITH CONTRAST TECHNIQUE: Multiplanar, multiecho pulse sequences of the brain and surrounding structures were obtained without and with intravenous contrast. CONTRAST:  68mL GADAVIST GADOBUTROL 1 MMOL/ML IV SOLN COMPARISON:  None. FINDINGS: Brain: There are bilateral occipital lobe infarcts and numerous infarcts within the deep watershed zones. There also numerous bilateral cerebellar infarcts. Early confluent hyperintense T2-weighted signal of the periventricular and deep white matter. There is generalized atrophy without lobar predilection. No chronic microhemorrhage. Normal midline structures. There are areas of post-ischemic leptomeningeal contrast enhancement within the watershed zones. Vascular: Normal flow voids. Skull and upper cervical spine: Normal marrow signal. Sinuses/Orbits: Negative. Other: None. IMPRESSION: 1. Bilateral occipital lobe, deep watershed zone and cerebellar infarcts. No acute hemorrhage or mass effect. 2. Generalized atrophy and chronic microvascular ischemia. Electronically Signed   By: Ulyses Jarred M.D.   On: 07/23/2020 22:57   DG Chest Port 1 View  Result Date: 07/23/2020 CLINICAL DATA:  Respiratory failure EXAM: PORTABLE CHEST 1 VIEW.  Patient is rotated. COMPARISON:  Chest x-ray  07/16/2020, chest x-ray 07/19/2020 FINDINGS: Endotracheal tube in similar position with tip terminating approximately 6 cm above the carina. Enteric tube again noted coursing below the diaphragm with tip and inside port collimated off view. Right peripherally inserted central venous catheter again noted with tip overlying the expected region of the superior cavoatrial junction. The heart size and mediastinal contours are within normal limits. Redemonstration of aortic arch calcifications. No focal consolidation or pulmonary edema. Persistent at least small volume left pleural effusion. Interval improvement of possible residual trace right pleural effusion. No pneumothorax. The visualized skeletal structures are unremarkable. IMPRESSION: Persistent small volume left pleural effusion. Interval improvement of possible residual trace right pleural effusion. Otherwise no acute cardiopulmonary abnormality. Lines and tubes in similar position. Electronically Signed   By: Iven Finn M.D.   On: 07/23/2020 09:31   EEG adult  Result Date: 07/24/2020 Lora Havens, MD     07/24/2020  5:21 PM Patient Name: Omar Todd MRN: [id number] Epilepsy Attending: Lora Havens Referring Physician/Provider: Dr Roland Rack Date: 07/24/2020 Duration: 23.54 mins Patient history: 69yo M with ams. EEG to evaluate for seizure. Level of alertness: comatose AEDs during EEG study: None Technical aspects: This EEG study was done with scalp electrodes positioned according to the 10-20 International system of electrode placement. Electrical activity was acquired at a sampling rate of 500Hz  and reviewed with a high frequency filter of 70Hz  and a low frequency filter of 1Hz . EEG data were recorded continuously and digitally stored. Description: EEG showed continuous generalized 3 to 6 Hz theta-delta slowing  as well as triphasic waves, generalized.   Hyperventilation and photic stimulation were not performed.   ABNORMALITY  -Continuous slow, generalized -Triphasic waves, generalized IMPRESSION: This study is suggestive of severe encephalopathy, nonspecific etiology but could be related to toxic-metabolic etiology. No seizures or epileptiform discharges were seen throughout the recording. Priyanka Barbra Sarks    Anti-infectives: Anti-infectives (From admission, onward)   Start     Dose/Rate Route Frequency Ordered Stop   07/25/20 1000  fluconazole (DIFLUCAN) tablet 200 mg  Status:  Discontinued        200 mg Per Tube Daily 07/23/20 0955 07/24/20 0746   07/25/20 1000  fluconazole (DIFLUCAN) tablet 400 mg        400 mg Per Tube Daily 07/24/20 0746     07/24/20 0800  fluconazole (DIFLUCAN) IVPB 800 mg        800 mg 100 mL/hr over 240 Minutes Intravenous  Once 07/23/20 0955 07/24/20 1400   07/23/20 1000  anidulafungin (ERAXIS) 100 mg in sodium chloride 0.9 % 100 mL IVPB  Status:  Discontinued       "And" Linked Group Details   100 mg 78 mL/hr over 100 Minutes Intravenous Every 24 hours 07/22/20 1148 07/23/20 0943   07/23/20 1000  metroNIDAZOLE (FLAGYL) IVPB 500 mg        500 mg 100 mL/hr over 60 Minutes Intravenous Every 8 hours 07/23/20 0943     07/22/20 1400  anidulafungin (ERAXIS) 200 mg in sodium chloride 0.9 % 200 mL IVPB  Status:  Discontinued       "And" Linked Group Details   200 mg 78 mL/hr over 200 Minutes Intravenous Every 24 hours 07/22/20 1148 07/23/20 0943   07/21/20 2200  linezolid (ZYVOX) tablet 600 mg        600 mg Per Tube Every 12 hours 07/21/20 1341     07/01/2020 2131  vancomycin variable dose per unstable renal function (pharmacist dosing)  Status:  Discontinued         Does not apply See admin instructions 07/02/2020 2131 07/21/20 1340   07/19/2020 1800  vancomycin (VANCOCIN) IVPB 1000 mg/200 mL premix  Status:  Discontinued        1,000 mg 200 mL/hr over 60 Minutes Intravenous Every 12 hours 07/19/20 1446 07/14/2020 0823   06/21/2020 1000  clindamycin (CLEOCIN) IVPB 900 mg       "And" Linked Group  Details   900 mg 100 mL/hr over 30 Minutes Intravenous 60 min pre-op 07/19/20 0854 07/10/2020 1537   07/14/2020 1000  gentamicin (GARAMYCIN) 460 mg in dextrose 5 % 100 mL IVPB       "And" Linked Group Details   5 mg/kg  91.9 kg 111.5 mL/hr over 60 Minutes Intravenous 60 min pre-op 07/19/20 0854 07/03/2020 1900   07/14/2020 1000  vancomycin (VANCOCIN) IVPB 1000 mg/200 mL premix  Status:  Discontinued        1,000 mg 200 mL/hr over 60 Minutes Intravenous Every 12 hours 06/26/2020 0823 07/18/2020 2018   07/19/20 1400  neomycin (MYCIFRADIN) tablet 1,000 mg       "And" Linked Group Details   1,000 mg Oral 3 times per day 07/19/20 0854 07/19/20 2335   07/19/20 1400  metroNIDAZOLE (FLAGYL) tablet 1,000 mg       "And" Linked Group Details   1,000 mg Oral 3 times per day 07/19/20 0854 07/19/20 2336   07/17/20 1600  cefTRIAXone (ROCEPHIN) 2 g in sodium chloride 0.9 % 100 mL IVPB  2 g 200 mL/hr over 30 Minutes Intravenous Every 24 hours 07/17/20 1528     07/17/20 1400  vancomycin (VANCOCIN) IVPB 1000 mg/200 mL premix  Status:  Discontinued        1,000 mg 200 mL/hr over 60 Minutes Intravenous Every 8 hours 07/17/20 0944 07/19/20 1446   07/02/2020 1600  ceFEPIme (MAXIPIME) 2 g in sodium chloride 0.9 % 100 mL IVPB  Status:  Discontinued        2 g 200 mL/hr over 30 Minutes Intravenous Every 8 hours 06/26/2020 1537 07/17/20 1528   07/01/2020 1500  clindamycin (CLEOCIN) IVPB 600 mg        600 mg 100 mL/hr over 30 Minutes Intravenous Every 6 hours 06/24/2020 1337 07/18/20 0553   06/23/2020 0600  vancomycin (VANCOREADY) IVPB 750 mg/150 mL  Status:  Discontinued        750 mg 150 mL/hr over 60 Minutes Intravenous Every 8 hours 07/01/2020 2310 07/17/20 0944   06/23/2020 0200  metroNIDAZOLE (FLAGYL) IVPB 500 mg  Status:  Discontinued        500 mg 100 mL/hr over 60 Minutes Intravenous Every 8 hours 07/14/2020 2220 07/12/2020 1337   07/09/2020 2315  ceFEPIme (MAXIPIME) 2 g in sodium chloride 0.9 % 100 mL IVPB  Status:   Discontinued        2 g 200 mL/hr over 30 Minutes Intravenous Every 8 hours 07/20/2020 2310 07/02/2020 1537   07/08/2020 2000  vancomycin (VANCOREADY) IVPB 1500 mg/300 mL        1,500 mg 150 mL/hr over 120 Minutes Intravenous  Once 06/23/2020 1834 06/29/2020 2326   07/19/2020 1800  vancomycin (VANCOCIN) IVPB 1000 mg/200 mL premix  Status:  Discontinued        1,000 mg 200 mL/hr over 60 Minutes Intravenous  Once 07/04/2020 1757 07/07/2020 1834   06/24/2020 1800  clindamycin (CLEOCIN) IVPB 600 mg        600 mg 100 mL/hr over 30 Minutes Intravenous  Once 07/05/2020 1759 07/11/2020 1913   06/27/2020 1800  ciprofloxacin (CIPRO) IVPB 400 mg        400 mg 200 mL/hr over 60 Minutes Intravenous  Once 06/25/2020 1759 07/17/2020 2039       Assessment/Plan 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 MRA head/neck without significant large vessel dx.  Right AKA 7/30-Dr. Sharol Given Right above-the-knee amputation incision infection S/p debridement by Dr. Constance Haw 07/04/2020 - management per Dr. 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 Constance Haw 07/11/2020 S/p debridement by Dr. Rosendo Gros and Dr. Diona Fanti 07/12/2020 S/p debridement by Dr. Rosendo Gros 07/18/2020 S/p debridement and laparoscopic diverting loop colostomy 07/20/20 Dr. Kieth Brightly - Having ostomy output. Keep red rubber in place for 7 days (to come out 9/6) - continue hydrotherapy. Continue BID wet to dry dressing changes. Add santyl  - Cx from ischial woundwith strep agalactiae - WBCdown to 28.3. Febrile to 102.8 in the last 24 hours.  - Continue to off-load pressure as able. Will order air mattress bed - We will see again Tuesday     FEN:TF, IVF, NPO IO:[id number] on rocephin, Fluconazole, Flagyl and Linezolid DVT:SCD's  Main issue is neuro status at this point   LOS: 13 days    Stark Klein , [facility] 07/25/2020,  8:49 AM Please see Amion for pager number during day hours 7:00am-4:30pm

## 2020-07-25 NOTE — Progress Notes (Signed)
Assisted tele visit to patient with family member.  Amayah Staheli DNP eLink RN 

## 2020-07-25 NOTE — Progress Notes (Signed)
[facility] Progress Note Patient Name: Omar Todd DOB: [birth year] MRN: [id number]   Date of Service  07/25/2020  HPI/Events of Note  Na+ = 163, K+ < 2.0 and Creatinine = 1.55.   eICU Interventions  Plan: 1. D/C Lasix. 2. Replace K+. 3. Free water 200 mL per tube Q 2 hours 4. Repeat BMP at 2 PM.      Intervention Category Major Interventions: Electrolyte abnormality - evaluation and management  [name] 07/25/2020, 6:50 AM

## 2020-07-25 NOTE — Progress Notes (Signed)
PT Cancellation Note  Patient Details Name: Omar Todd MRN: [id number] DOB: 1951-08-01   Cancelled Treatment:    Reason Eval/Treat Not Completed: Other (comment); Pt is medically unstable for hydrotherapy at this time. Will defer and f/u when goals of care are determined, recommend continued dressing changes in the interim.   [facility] 07/25/2020, 9:07 AM

## 2020-07-25 NOTE — Progress Notes (Signed)
[facility] Progress Note Patient Name: Omar Todd DOB: February 22, 1951 MRN: [id number]   Date of Service  07/25/2020  HPI/Events of Note  Serum Na+ 167, K+ 2.4  eICU Interventions  Free water increased to 300 ml Q 2 hours, E-link adult electrolyte protocol for severe hypokalemia ordered.        [name] [name] 07/25/2020, 9:12 PM

## 2020-07-25 NOTE — Progress Notes (Signed)
Called to update patient's sister and discuss goals of care, went to voicemail  Left message for her to call back

## 2020-07-25 NOTE — Progress Notes (Signed)
Neurology Progress Note   S:// Seen and examined.  No acute changes.  Family conversations ongoing with no decision on limitation of care   O:// Current vital signs: BP (!) 100/40    Pulse 90    Temp (!) 102.8 F (39.3 C) (Oral)    Resp (!) 22    Ht _0  (1.88 m)    Wt 76.4 kg    SpO2 98%    BMI 21.63 kg/m  Vital signs in last 24 hours: Temp:  [97.1 F (36.2 C)-102.8 F (39.3 C)] 102.8 F (39.3 C) (09/04 0757) Pulse Rate:  [80-107] 90 (09/04 1114) Resp:  [21-31] 22 (09/04 1114) BP: (95-179)/(39-69) 100/40 (09/04 1100) SpO2:  [97 %-100 %] 98 % (09/04 1114) FiO2 (%):  [30 %] 30 % (09/04 1114) Weight:  [76.4 kg] 76.4 kg (09/04 0500) Intubated on no sedation No spontaneous movement Opens eyes to voice Pupils equal round react light Does not follow commands Does not blink to threat from either side Facial symmetry difficult to ascertain due to the endotracheal tube in place. No movement to noxious stimulation   Medications  Current Facility-Administered Medications:    acetaminophen (TYLENOL) tablet 650 mg, 650 mg, Per Tube, Q6H PRN, Kinsinger, Arta Bruce, MD, 650 mg at 07/25/20 0813   amLODipine (NORVASC) tablet 10 mg, 10 mg, Per NG tube, Daily, Olalere, Adewale A, MD, 10 mg at 07/24/20 1042   ascorbic acid (VITAMIN C) tablet 500 mg, 500 mg, Per Tube, BID, Kinsinger, Arta Bruce, MD, 500 mg at 07/25/20 1010   cefTRIAXone (ROCEPHIN) 2 g in sodium chloride 0.9 % 100 mL IVPB, 2 g, Intravenous, Q24H, Kinsinger, Arta Bruce, MD, Stopped at 07/24/20 1642   chlorhexidine gluconate (MEDLINE KIT) (PERIDEX) 0.12 % solution 15 mL, 15 mL, Mouth Rinse, BID, Kinsinger, Arta Bruce, MD, 15 mL at 07/25/20 0706   chlorhexidine gluconate (MEDLINE KIT) (PERIDEX) 0.12 % solution 15 mL, 15 mL, Mouth Rinse, BID, Olalere, Adewale A, MD, 15 mL at 07/25/20 1006   Chlorhexidine Gluconate Cloth 2 % PADS 6 each, 6 each, Topical, Daily, Kinsinger, Arta Bruce, MD, 6 each at 07/25/20 1008   collagenase  (SANTYL) ointment, , Topical, BID, Maczis, Barth Kirks, PA-C, Given at 07/25/20 1008   feeding supplement (PROSource TF) liquid 90 mL, 90 mL, Per Tube, 5 X Daily, Kinsinger, Arta Bruce, MD, 90 mL at 07/25/20 1023   feeding supplement (VITAL AF 1.2 CAL) liquid 1,000 mL, 1,000 mL, Per Tube, Continuous, Kinsinger, Arta Bruce, MD, Last Rate: 50 mL/hr at 07/24/20 1200, Restarted at 07/24/20 1200   fentaNYL (SUBLIMAZE) bolus via infusion 25 mcg, 25 mcg, Intravenous, Q15 min PRN, Kinsinger, Arta Bruce, MD, 25 mcg at 07/22/20 0635   fentaNYL 2530mg in NS 2558m(1092mml) infusion-PREMIX, 25-300 mcg/hr, Intravenous, Continuous, Kinsinger, LukArta BruceD, Stopped at 07/23/20 0911   ferrous sulfate 300 (60 Fe) MG/5ML syrup 300 mg, 300 mg, Per Tube, Q breakfast, Kinsinger, LukArta BruceD, 300 mg at 07/25/20 0814   fluconazole (DIFLUCAN) tablet 400 mg, 400 mg, Per Tube, Daily, ZeiBerton MountPH, 400 mg at 07/25/20 1009   free water 200 mL, 200 mL, Per Tube, Q2H, SomAnders SimmondsD, 200 mL at 07/25/20 1100   insulin aspart (novoLOG) injection 0-15 Units, 0-15 Units, Subcutaneous, Q4H, Olalere, Adewale A, MD, 3 Units at 07/25/20 0346   insulin aspart (novoLOG) injection 0-9 Units, 0-9 Units, Subcutaneous, Q4H, Kinsinger, LukArta BruceD, 2 Units at 07/25/20 0346   insulin aspart (novoLOG) injection 2  Units, 2 Units, Subcutaneous, Q4H, Kinsinger, Arta Bruce, MD, 2 Units at 07/25/20 0347   insulin glargine (LANTUS) injection 10 Units, 10 Units, Subcutaneous, BID, Olalere, Adewale A, MD   labetalol (NORMODYNE) injection 10 mg, 10 mg, Intravenous, Q2H PRN, Ander Slade, Adewale A, MD   lactated ringers infusion, , Intravenous, Continuous, Kinsinger, Arta Bruce, MD, Stopped at 07/22/20 1703   linezolid (ZYVOX) tablet 600 mg, 600 mg, Per Tube, Q12H, Olalere, Adewale A, MD, 600 mg at 07/25/20 1009   MEDLINE mouth rinse, 15 mL, Mouth Rinse, 10 times per day, Olalere, Adewale A, MD, 15 mL at 07/25/20 1131    metroNIDAZOLE (FLAGYL) IVPB 500 mg, 500 mg, Intravenous, Q8H, Olalere, Adewale A, MD, Stopped at 07/25/20 1112   midazolam (VERSED) 50 mg/50 mL (1 mg/mL) premix infusion, 1 mg/hr, Intravenous, Continuous, Olalere, Adewale A, MD, Stopped at 07/22/20 0828   midazolam (VERSED) injection 1-4 mg, 1-4 mg, Intravenous, Q2H PRN, Kinsinger, Arta Bruce, MD, 2 mg at 07/23/20 0113   multivitamin with minerals tablet 1 tablet, 1 tablet, Oral, Daily, Olalere, Adewale A, MD, 1 tablet at 07/25/20 1010   nutrition supplement (JUVEN) (JUVEN) powder packet 1 packet, 1 packet, Per Tube, BID BM, Kinsinger, Arta Bruce, MD, 1 packet at 07/25/20 1011   ondansetron (ZOFRAN) tablet 4 mg, 4 mg, Oral, Q6H PRN, 4 mg at 07/18/20 2230 **OR** ondansetron (ZOFRAN) injection 4 mg, 4 mg, Intravenous, Q6H PRN, Kinsinger, Arta Bruce, MD   pantoprazole sodium (PROTONIX) 40 mg/20 mL oral suspension 40 mg, 40 mg, Per Tube, Daily, Olalere, Adewale A, MD, 40 mg at 07/25/20 1010   polyethylene glycol (MIRALAX / GLYCOLAX) packet 17 g, 17 g, Oral, Daily PRN, Kinsinger, Arta Bruce, MD   potassium chloride 10 mEq in 50 mL *CENTRAL LINE* IVPB, 10 mEq, Intravenous, Q1 Hr x 6, Anders Simmonds, MD, Last Rate: 50 mL/hr at 07/25/20 1113, 10 mEq at 07/25/20 1113   sodium chloride flush (NS) 0.9 % injection 10-40 mL, 10-40 mL, Intracatheter, Q12H, Kinsinger, Arta Bruce, MD, 10 mL at 07/25/20 1011   sodium chloride flush (NS) 0.9 % injection 10-40 mL, 10-40 mL, Intracatheter, PRN, Kinsinger, Arta Bruce, MD   thiamine tablet 100 mg, 100 mg, Per Tube, Daily, Kinsinger, Arta Bruce, MD, 100 mg at 07/25/20 1010   zinc sulfate capsule 220 mg, 220 mg, Per Tube, Daily, Kinsinger, Arta Bruce, MD, 220 mg at 07/24/20 1231 Labs CBC    Component Value Date/Time   WBC 22.0 (H) 07/25/2020 0425   RBC 3.49 (L) 07/25/2020 0425   HGB 8.5 (L) 07/25/2020 0425   HGB 7.3 (L) 02/28/2020 1552   HCT 29.4 (L) 07/25/2020 0425   HCT 25.1 (L) 02/28/2020 1552   PLT  429 (H) 07/25/2020 0425   PLT 374 02/28/2020 1552   MCV 84.2 07/25/2020 0425   MCV 71 (L) 02/28/2020 1552   MCH 24.4 (L) 07/25/2020 0425   MCHC 28.9 (L) 07/25/2020 0425   RDW 20.8 (H) 07/25/2020 0425   RDW 17.0 (H) 02/28/2020 1552   LYMPHSABS 0.5 (L) 07/21/2020 0406   LYMPHSABS 1.8 09/12/2018 1553   MONOABS 0.8 07/21/2020 0406   EOSABS 0.0 07/21/2020 0406   EOSABS 0.1 09/12/2018 1553   BASOSABS 0.1 07/21/2020 0406   BASOSABS 0.1 09/12/2018 1553    CMP     Component Value Date/Time   NA 163 (HH) 07/25/2020 0425   NA 130 (L) 02/28/2020 1552   K <2.0 (LL) 07/25/2020 0425   CL 120 (H) 07/25/2020 0425  CO2 26 07/25/2020 0425   GLUCOSE 178 (H) 07/25/2020 0425   BUN 159 (H) 07/25/2020 0425   BUN 13 02/28/2020 1552   CREATININE 1.55 (H) 07/25/2020 0425   CREATININE 0.88 04/11/2013 0000   CALCIUM 7.4 (L) 07/25/2020 0425   PROT 4.6 (L) 07/03/2020 1000   PROT 6.6 08/08/2018 1126   ALBUMIN 1.0 (L) 06/28/2020 1000   ALBUMIN 4.4 08/08/2018 1126   AST 34 06/27/2020 1000   ALT 51 (H) 07/19/2020 1000   ALKPHOS 184 (H) 06/28/2020 1000   BILITOT 0.4 06/24/2020 1000   BILITOT <0.2 08/08/2018 1126   GFRNONAA 45 (L) 07/25/2020 0425   GFRNONAA >89 04/11/2013 0000   GFRAA 52 (L) 07/25/2020 0425   GFRAA >89 04/11/2013 0000   Ammonia-20 TSH 1.47   2D echo 1. Left ventricular ejection fraction, by estimation, is approximately  35%. The left ventricle has moderately decreased function. The left  ventricle demonstrates regional wall motion abnormalities (see scoring  diagram/findings for description). Left  ventricular diastolic parameters are consistent with Grade I diastolic  dysfunction (impaired relaxation).  2. Right ventricular systolic function is normal. The right ventricular  size is normal. Tricuspid regurgitation signal is inadequate for assessing  PA pressure.  3. The mitral valve is grossly normal, mildly thickened and calcified.  Mild mitral valve regurgitation.  4.  The aortic valve is probably tricuspid based on limited views with  moderate calcification. Aortic valve regurgitation is mild. Moderate to  severe aortic valve stenosis. Aortic valve mean gradient measures 37.0  mmHg. Aortic valve Vmax measures 4.07  m/s.  5. Aortic dilatation noted. There is mild dilatation of the ascending  aorta.  6. The inferior vena cava is normal in size with <50% respiratory  variability, suggesting right atrial pressure of 8 mmHg.   Left atrium normal in size, no interatrial shunt detected at atrial level by color-flow Doppler.  A1c 7.2 Lipid panel not performed  Imaging I have reviewed images in epic and the results pertinent to this consultation are: MRI head with bilateral occipital lobe, deep watershed zone and cerebellar infarcts with no acute hemorrhage. MRA head and neck without contrast-mild atherosclerotic plaque at both carotid bifurcations-30% narrowing of the proximal ICA bulb on the left, no measurable stenosis of the right ICA.  No intracranial large or medium vessel occlusion or correctable proximal stenosis.  Distal vessel atherosclerotic irregularity noted.   Assessment: 69 year old man admitted since 07/14/2020 with infected pressure ulcer, necrotizing fasciitis for frequent debridement transfer to [facility] from [facility].  Blood cultures negative.  Has had significant fluctuation in blood pressures with lowest documented pressures 78/45 with a MAP of 54 at other times has been severely hypertensive with the blood pressure is systolic in 062I. He has been sedated since arrival but given that he was not waking up once sedation was paused, CT obtained day prior to obtain neurology consult demonstrated several posterior circulation strokes and an MRI demonstrated extensive deep watershed infarcts bilaterally as well as posterior circulation embolic disease.  Cause of the stroke is not 100% clear-likely cardioembolic as well as from  hypoperfusion.  For part of his stroke work-up-no proximal or distal vessel occlusion or stenosis. 2D echo reveals LVEF of 35% with moderately decreased left ventricular function and wall motion abnormalities of the left ventricle, no evidence of shunt.  Transthoracic echo does not mention any vegetation but infective endocarditis should always be considered in the patient who has an infectious source, had septic looking picture  at 1 point and now has watershed-looking strokes without severe vessel stenosis.  His basic labs from this morning reveals severe hypernatremia with a sodium of 163, hyperglycemia and severe hypokalemia-toxic metabolic encephalopathy which might be causing part of his altered mental status along with the strokes.   Impression: -Acute ischemic stroke-likely cardioembolic versus secondary to hypoperfusion -Toxic metabolic encephalopathy from severe hyper natremia  Recommendations: -From a stroke prevention standpoint-aspirin should be initiated whenever okay with the primary team and surgical teams. -2D echocardiogram revealing LVEF of 35%-consider cardiology consultation for further work-up if the family chooses to maintain aggressive level of care. -Check blood cultures, consider TEE if positive. I have ordered blood cultures -Correction of hypernatremia and other metabolic derangements per primary team as you are. -Supportive care per primary team as you are -I would be reluctant to recommend anticoagulation due to his multiple other medical conditions, requirement for debridement as well as concurrent anemia-further cardiac work-up might clarify the etiology of his heart failure and strategies for management of heart failure, preferably other than anticoagulation. -If aggressive care is pursued-and is discharged to facility, continue long-term cardiac monitoring to evaluate for any evidence of underlying paroxysmal atrial fibrillation  Neurology service will be  available as needed.  Please call with questions. I have paged the primary PCCM attending and awaiting callback to discuss my plan.  -- Amie Portland, MD [facility] Pager: [phone] If 7pm to 7am, please call on call as listed on AMION.  CRITICAL CARE ATTESTATION Performed by: Amie Portland, MD Total critical care time: 45 minutes Critical care time was exclusive of separately billable procedures and treating other patients and/or supervising APPs/Residents/Students Critical care was necessary to treat or prevent imminent or life-threatening deterioration due to acute ischemic stroke, toxic metabolic encephalopathy. This patient is critically ill and at significant risk for neurological worsening and/or death and care requires constant monitoring. Critical care was time spent personally by me on the following activities: development of treatment plan with patient and/or surrogate as well as nursing, discussions with consultants, evaluation of patient's response to treatment, examination of patient, obtaining history from patient or surrogate, ordering and performing treatments and interventions, ordering and review of laboratory studies, ordering and review of radiographic studies, pulse oximetry, re-evaluation of patient's condition, participation in multidisciplinary rounds and medical decision making of high complexity in the care of this patient.

## 2020-07-25 NOTE — Progress Notes (Addendum)
Discussed with cardiology  A TEE will only guide [place] with respect to duration of treatment, ongoing comorbidities and the fact that he will not be a surgical candidate with his recent strokes, my feeling that he will not survive this process as he has extensive infected wounds that we are currently treating but worsening despite all interventions  Will follow-up on cultures  Continue current care  Continue to correct electrolyte derangement

## 2020-07-26 LAB — BASIC METABOLIC PANEL
Anion gap: 13 (ref 5–15)
Anion gap: 15 (ref 5–15)
Anion gap: 14 (ref 5–15)
Anion gap: 16 — ABNORMAL HIGH (ref 5–15)
BUN: 170 mg/dL — ABNORMAL HIGH (ref 8–23)
BUN: 171 mg/dL — ABNORMAL HIGH (ref 8–23)
BUN: 166 mg/dL — ABNORMAL HIGH (ref 8–23)
BUN: 157 mg/dL — ABNORMAL HIGH (ref 8–23)
CO2: 24 mmol/L (ref 22–32)
CO2: 22 mmol/L (ref 22–32)
CO2: 21 mmol/L — ABNORMAL LOW (ref 22–32)
CO2: 16 mmol/L — ABNORMAL LOW (ref 22–32)
Calcium: 7.1 mg/dL — ABNORMAL LOW (ref 8.9–10.3)
Calcium: 7.2 mg/dL — ABNORMAL LOW (ref 8.9–10.3)
Calcium: 6.9 mg/dL — ABNORMAL LOW (ref 8.9–10.3)
Calcium: 7.2 mg/dL — ABNORMAL LOW (ref 8.9–10.3)
Chloride: 127 mmol/L — ABNORMAL HIGH (ref 98–111)
Chloride: 124 mmol/L — ABNORMAL HIGH (ref 98–111)
Chloride: 124 mmol/L — ABNORMAL HIGH (ref 98–111)
Chloride: 123 mmol/L — ABNORMAL HIGH (ref 98–111)
Creatinine, Ser: 1.78 mg/dL — ABNORMAL HIGH (ref 0.61–1.24)
Creatinine, Ser: 1.87 mg/dL — ABNORMAL HIGH (ref 0.61–1.24)
Creatinine, Ser: 2.12 mg/dL — ABNORMAL HIGH (ref 0.61–1.24)
Creatinine, Ser: 2.52 mg/dL — ABNORMAL HIGH (ref 0.61–1.24)
GFR calc Af Amer: 44 mL/min — ABNORMAL LOW (ref >60)
GFR calc Af Amer: 42 mL/min — ABNORMAL LOW (ref >60)
GFR calc Af Amer: 36 mL/min — ABNORMAL LOW (ref >60)
GFR calc Af Amer: 29 mL/min — ABNORMAL LOW (ref >60)
GFR calc non Af Amer: 38 mL/min — ABNORMAL LOW (ref >60)
GFR calc non Af Amer: 36 mL/min — ABNORMAL LOW (ref >60)
GFR calc non Af Amer: 31 mL/min — ABNORMAL LOW (ref >60)
GFR calc non Af Amer: 25 mL/min — ABNORMAL LOW (ref >60)
Glucose, Bld: 250 mg/dL — ABNORMAL HIGH (ref 70–99)
Glucose, Bld: 203 mg/dL — ABNORMAL HIGH (ref 70–99)
Glucose, Bld: 137 mg/dL — ABNORMAL HIGH (ref 70–99)
Glucose, Bld: 125 mg/dL — ABNORMAL HIGH (ref 70–99)
Potassium: 2.6 mmol/L — CL (ref 3.5–5.1)
Potassium: 2.7 mmol/L — CL (ref 3.5–5.1)
Potassium: 3.2 mmol/L — ABNORMAL LOW (ref 3.5–5.1)
Potassium: 5 mmol/L (ref 3.5–5.1)
Sodium: 164 mmol/L (ref 135–145)
Sodium: 161 mmol/L (ref 135–145)
Sodium: 159 mmol/L — ABNORMAL HIGH (ref 135–145)
Sodium: 155 mmol/L — ABNORMAL HIGH (ref 135–145)

## 2020-07-26 LAB — BLOOD GAS, ARTERIAL
Acid-base deficit: 19 mmol/L — ABNORMAL HIGH (ref 0.0–2.0)
Bicarbonate: 9.4 mmol/L — ABNORMAL LOW (ref 20.0–28.0)
Drawn by: 23281
FIO2: 30
MECHVT: 650 mL
O2 Saturation: 85.1 %
PEEP: 5 cmH2O
Patient temperature: 99.8
RATE: 20 resp/min
pCO2 arterial: 33.9 mmHg (ref 32.0–48.0)
pH, Arterial: 7.077 — CL (ref 7.350–7.450)
pO2, Arterial: 74.1 mmHg — ABNORMAL LOW (ref 83.0–108.0)

## 2020-07-26 LAB — GLUCOSE, CAPILLARY
Glucose-Capillary: 204 mg/dL — ABNORMAL HIGH (ref 70–99)
Glucose-Capillary: 112 mg/dL — ABNORMAL HIGH (ref 70–99)
Glucose-Capillary: 125 mg/dL — ABNORMAL HIGH (ref 70–99)
Glucose-Capillary: 87 mg/dL (ref 70–99)
Glucose-Capillary: 124 mg/dL — ABNORMAL HIGH (ref 70–99)
Glucose-Capillary: 35 mg/dL — CL (ref 70–99)

## 2020-07-26 LAB — CBC
HCT: 28 % — ABNORMAL LOW (ref 39.0–52.0)
Hemoglobin: 8 g/dL — ABNORMAL LOW (ref 13.0–17.0)
MCH: 24.8 pg — ABNORMAL LOW (ref 26.0–34.0)
MCHC: 28.6 g/dL — ABNORMAL LOW (ref 30.0–36.0)
MCV: 86.7 fL (ref 80.0–100.0)
Platelets: 322 10*3/uL (ref 150–400)
RBC: 3.23 MIL/uL — ABNORMAL LOW (ref 4.22–5.81)
RDW: 21 % — ABNORMAL HIGH (ref 11.5–15.5)
WBC: 29.3 10*3/uL — ABNORMAL HIGH (ref 4.0–10.5)
nRBC: 0.1 % (ref 0.0–0.2)

## 2020-07-26 LAB — CK TOTAL AND CKMB (NOT AT ARMC)
CK, MB: 4.4 ng/mL (ref 0.5–5.0)
Relative Index: 1.9 (ref 0.0–2.5)
Total CK: 232 U/L (ref 49–397)

## 2020-07-26 LAB — TROPONIN I (HIGH SENSITIVITY)
Troponin I (High Sensitivity): 1045 ng/L (ref <18)
Troponin I (High Sensitivity): 1091 ng/L (ref <18)

## 2020-07-26 MED ORDER — POTASSIUM CHLORIDE 20 MEQ/15ML (10%) PO SOLN
40.0000 meq | Freq: Once | ORAL | Status: AC
  Administered 2020-07-26: 40 meq via ORAL
  Filled 2020-07-26: qty 30

## 2020-07-26 MED ORDER — FLUCONAZOLE 100 MG PO TABS: 200.0000 mg | ORAL_TABLET | Freq: Every day | ORAL | Status: DC

## 2020-07-26 MED ORDER — EPINEPHRINE 1 MG/10ML IJ SOSY
0.1000 mg | PREFILLED_SYRINGE | Freq: Once | INTRAMUSCULAR | Status: AC
  Administered 2020-07-26: 0.1 mg via INTRAVENOUS

## 2020-07-26 MED ORDER — EPINEPHRINE 1 MG/10ML IJ SOSY
PREFILLED_SYRINGE | INTRAMUSCULAR | Status: AC
  Filled 2020-07-26: qty 10

## 2020-07-26 MED ORDER — VASOPRESSIN 20 UNITS/100 ML INFUSION FOR SHOCK
0.0000 [IU]/min | INTRAVENOUS | Status: DC
  Administered 2020-07-26 – 2020-07-27 (×2): 0.03 [IU]/min via INTRAVENOUS
  Filled 2020-07-26 (×2): qty 100

## 2020-07-26 MED ORDER — ALBUMIN HUMAN 25 % IV SOLN
INTRAVENOUS | Status: AC
  Filled 2020-07-26: qty 250

## 2020-07-26 MED ORDER — EPINEPHRINE HCL 5 MG/250ML IV SOLN IN NS
0.5000 ug/min | INTRAVENOUS | Status: DC
  Administered 2020-07-26: 21:00:00 5 ug/min via INTRAVENOUS
  Administered 2020-07-27 (×2): 20 ug/min via INTRAVENOUS
  Filled 2020-07-26 (×4): qty 250

## 2020-07-26 MED ORDER — METOPROLOL TARTRATE 25 MG/10 ML ORAL SUSPENSION
12.5000 mg | Freq: Two times a day (BID) | ORAL | Status: DC
  Filled 2020-07-26 (×2): qty 5

## 2020-07-26 MED ORDER — DEXTROSE 10 % IV SOLN: INTRAVENOUS | Status: DC

## 2020-07-26 MED ORDER — POTASSIUM CHLORIDE 10 MEQ/100ML IV SOLN
10.0000 meq | INTRAVENOUS | Status: AC
  Administered 2020-07-26 (×2): 10 meq via INTRAVENOUS
  Filled 2020-07-26 (×2): qty 100

## 2020-07-26 MED ORDER — EPINEPHRINE 1 MG/10ML IJ SOSY
PREFILLED_SYRINGE | INTRAMUSCULAR | Status: AC
  Filled 2020-07-26: qty 20

## 2020-07-26 MED ORDER — SODIUM BICARBONATE 8.4 % IV SOLN
100.0000 meq | Freq: Once | INTRAVENOUS | Status: AC
  Administered 2020-07-26: 100 meq via INTRAVENOUS
  Filled 2020-07-26: qty 100

## 2020-07-26 MED ORDER — SODIUM CHLORIDE 0.9 % IV BOLUS
250.0000 mL | Freq: Once | INTRAVENOUS | Status: AC
  Administered 2020-07-26: 250 mL via INTRAVENOUS

## 2020-07-26 MED ORDER — ALBUMIN HUMAN 5 % IV SOLN
12.5000 g | Freq: Once | INTRAVENOUS | Status: AC
  Administered 2020-07-26: 12.5 g via INTRAVENOUS

## 2020-07-26 MED ORDER — CALCIUM GLUCONATE-NACL 1-0.675 GM/50ML-% IV SOLN
1.0000 g | Freq: Once | INTRAVENOUS | Status: AC
  Administered 2020-07-26: 1000 mg via INTRAVENOUS
  Filled 2020-07-26: qty 50

## 2020-07-26 MED ORDER — NOREPINEPHRINE 4 MG/250ML-% IV SOLN
0.0000 ug/min | INTRAVENOUS | Status: DC
  Administered 2020-07-26: 8 ug/min via INTRAVENOUS
  Administered 2020-07-26: 5.333 ug/min via INTRAVENOUS
  Administered 2020-07-26: 2 ug/min via INTRAVENOUS
  Administered 2020-07-27 (×2): 20 ug/min via INTRAVENOUS
  Filled 2020-07-26 (×5): qty 250

## 2020-07-26 MED ORDER — POTASSIUM CHLORIDE 20 MEQ/15ML (10%) PO SOLN
40.0000 meq | ORAL | Status: AC
  Administered 2020-07-26 (×3): 40 meq
  Filled 2020-07-26 (×3): qty 30

## 2020-07-26 MED ORDER — ASPIRIN 81 MG PO CHEW
81.0000 mg | CHEWABLE_TABLET | Freq: Every day | ORAL | Status: DC
  Administered 2020-07-26: 81 mg via ORAL
  Filled 2020-07-26: qty 1

## 2020-07-26 MED ORDER — DEXTROSE 50 % IV SOLN
1.0000 | Freq: Once | INTRAVENOUS | Status: AC
  Administered 2020-07-26: 50 mL via INTRAVENOUS
  Filled 2020-07-26: qty 50

## 2020-07-26 NOTE — Progress Notes (Signed)
Pharmacy Antibiotic Note  Omar Todd is a 69 y.o. male admitted on 07/17/2020 with necrotizing fasciitis and candida tracheitis.  Pharmacy has been consulted for fluconazole dosing. Patient also receiving linezolid and ceftriaxone. Add metronidazole 9/2 since necrotizing infection to cover anaerobes.  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 + watershed stroke per MRI 9/2  Plan:  Decrease fluconazole to 200mg  PT daily for CrCl < 50 ml/min today)  No need to renally adjust other antibiotics  Follow SCr and CrCl to determine need to increase fluconazole dose to 400mg  daily if CrCl improves to > 50 ml/min  Height: 6\' 2"  (188 cm) Weight: 76.4 kg (168 lb 6.9 oz) IBW/kg (Calculated) : 82.2  Temp (24hrs), Avg:99.9 F (37.7 C), Min:97.2 F (36.2 C), Max:103 F (39.4 C)  Recent Labs  Lab 07/19/20 1210 06/23/2020 1000 07/21/20 0406 07/21/20 1139 07/22/20 0836 07/22/20 0836 07/23/20 1033 07/23/20 1033 07/24/20 0353 07/24/20 0353 07/25/20 0425 07/25/20 1245 07/25/20 1732 07/25/20 2353 07/26/20 0600 07/26/20 0611  WBC  --    < >   < >  --  40.4*  --  31.8*  --  28.3*  --  22.0*  --   --   --   --  29.3*  CREATININE  --    < >   < >  --  1.74*   < > 1.64*   < > 1.55*   < > 1.55* 1.83* 1.89* 1.78* 1.87*  --   LATICACIDVEN 1.5  --   --   --   --   --   --   --   --   --   --   --   --   --   --   --   VANCORANDOM  --   --   --  23  --   --   --   --   --   --   --   --   --   --   --   --    < > = values in this interval not displayed.    Estimated Creatinine Clearance: 40.3 mL/min (A) (by C-G formula based on SCr of 1.87 mg/dL (H)).    Allergies  Allergen Reactions   Penicillins Other (See Comments)    Pt. States he "passed out"    Antimicrobials this admission:  8/22 Cipro x 1 8/22 Clinda x 1, resume 8/23 >>8/28 8/22 Vancomycin >> 8/31 8/23  Metronidazole >> 8/23  9/2>> 8/23 Cefepime >> 8/27 8/27 Ceftriaxone >>  8/30 Clinda/Gent IV x 1 pre-op 8/31 Zyvox >> 9/1 eraxis>> 9/2 9/3 fluconazole >> 9/2 metronidazole >>  Dose adjustments this admission:  8/27 VT at 05:30 = 13 on 750mg  q8h --> increase to 1g q8h 8/29 VT at 05:30 = 26 on 1g IV q8h and SCr doubled.  Decrease to 1g q12h 8/30 Discontinued, dose off of random levels  Microbiology results:  8/22 COVID: neg 8/22 BCx: NGF 8/22 UCx:  multiple species (F) 8/22 Wound from ulcer cxs: multiple organisms, no staph, no group A strep 8/23 Sacrum abscess: mixed anaerobic flora, no staph, no group A strep (F) 8/23 Ischial wound: Strep agalactiae 8/23 AKA stump: rare MRSA, rare GBS (F) 8/23 Inguinal wound: rare Klebsiella oxytoca (pan-sens except ampicillin), rare strep constellatus (sens: levo, vanc)  (F) 8/24 MRSA PCR: positive 8/25 Respiratory panel (RSV/Flu/COVID):  all neg 8/29 Respiratory cxt: flu neg, covid neg 8/29 TA: moderate candida albicans F 8/26 & 8/29 & 8/30 covid neg  Thank you for allowing pharmacy to be a part of this patients care.  Peggyann Juba, PharmD, BCPS Pharmacy: [phone] 07/26/2020 8:38 AM

## 2020-07-26 NOTE — Progress Notes (Signed)
CRITICAL VALUE ALERT  Critical Value:  Blood sugar 35  Date & Time Notied:  07/26/20 @ 2325  Provider Notified: Yes Elink MD  Orders Received/Actions taken: 1 Amp D50 and D10 gtt

## 2020-07-26 NOTE — Progress Notes (Signed)
ST-T wave changes noted on EKG  Obtain cardiac markers  Risk of anticoagulation is huge-patient with huge nonhealing wounds Is on pressors  Metoprolol 12.5 bid Start aspirin

## 2020-07-26 NOTE — Progress Notes (Signed)
Assisted tele visit to patient with family member.  Olivea Sonnen M, RN   

## 2020-07-26 NOTE — Progress Notes (Signed)
CRITICAL VALUE ALERT  Critical Value:  Troponin 1091 and pH 7.077  Date & Time Notied:  07/26/20 @ 2110  Provider Notified: Warren Lacy MD aware  Orders Received/Actions taken: Pt transitioned to DNR, no escalation of care

## 2020-07-26 NOTE — Progress Notes (Signed)
Assisted tele visit to patient with sister.  Omar Todd eLink RN

## 2020-07-26 NOTE — Progress Notes (Signed)
K+ not yet replete.[place] Progress Note Patient Name: Omar Todd DOB: 08/03/1951 MRN: [id number]   Date of Service  07/26/2020  HPI/Events of Note  K+ not yet replete.  eICU Interventions  Additional 20 meq  of KCL iv ordered.        [name] 07/26/2020, 1:37 AM

## 2020-07-26 NOTE — Progress Notes (Signed)
CRITICAL VALUE ALERT  Critical Value:  Troponin 1,045  Date & Time Notied:  07/26/2020 2000  Provider Notified: Will call Warren Lacy  Orders Received/Actions taken: Will wait for orders

## 2020-07-26 NOTE — Progress Notes (Signed)
[place] Progress Note Patient Name: Omar Todd DOB: 1951-09-05 MRN: [id number]   Date of Service  07/26/2020  HPI/Events of Note  Blood sugar 33 mg %  eICU Interventions  D 50 % W 1 amp iv stat, D 10 %  W @ 75 ml / hour.        Omar Todd 07/26/2020, 11:35 PM

## 2020-07-26 NOTE — Progress Notes (Signed)
[place] Progress Note Patient Name: Omar Todd DOB: 12/14/1950 MRN: [id number]   Date of Service  07/26/2020  HPI/Events of Note  Hypotension  eICU Interventions  Norepinephrine ordered         Frederik Pear 07/26/2020, 6:41 AM

## 2020-07-26 NOTE — Progress Notes (Signed)
CRITICAL VALUE ALERT  Critical Value:  Na+=164, K+=2.6  Date & Time Notied: 9/5@0110   Provider Notified: 9/5@0112   Orders Received/Actions taken:

## 2020-07-26 NOTE — Progress Notes (Signed)
CRITICAL VALUE ALERT  Critical Value:  Sodium 161, Potassium 2.7  Date & Time Notied:  07/26/2020 0810  Provider Notified: Dr. Ander Slade  Orders Received/Actions taken: Will place new orders.

## 2020-07-26 NOTE — Progress Notes (Signed)
Pt noted to be hypotensive. elink called and informed. Labs drawn and sent.

## 2020-07-26 NOTE — Progress Notes (Addendum)
NAME:  Omar Todd, MRN:  [id number], DOB:  [birth year], LOS: 36 ADMISSION DATE:  06/23/2020, CONSULTATION DATE:  07/14/20 REFERRING MD:  [name] trnasfer, CHIEF COMPLAINT:  [name] at [place] History    30 pack current smoker with DM, COPD Nos, BP, lipid, GERD. 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 At some pont also has stage 2 sacral decub "Size of a quarter"- described at time of this discharge. Discharged to ? Rehab and then signed out ? AMA from rehab.   [facility]21[facility]2020 with sepsis syndrome(fever + SIRS with lactate 2.1) with growing. Rt buttock ulcer noted to be stage 3 large wth fecal soiling but R AKA stump incision well healed but  By 06/26/2020 purulent drainage noted. CT pelvis c/w necrotizing fasciitis (with sub cut gas) in right perineal soft tissue and bedside exam was concerning for crepitus and concern for Fournier gangrene. Patient noticed to be disoriented by 06/25/2020  On 07/16/2020 Hervey Ard excisional debridement of right buttock, right inguinal region, and right scrotal skin measuring (right buttock / right inguinal wound 20cmX10cmX4cm and right scrotal skin 15cmX8cmX2cm); Drainage and packing of Right AKA wound infection  -. 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 Concern for need for repeated debridement and urology involvement and patient transferred to [facility] from [facility] on 07/14/20. Upon arrival getting PRBC for hgb < 7gm% and sedatedh diprivan gtt and fent gtt on ventilator needing neo for bp support.    Decision maker:  he has a wife at [facility] with dementia and estranged children he has not seen since they were babies.Johanthan Kneeland [phone]  She is his  closest relative and decision maker. She does not have health care power of attorney.    Past Medical History     has a past medical history of COPD (chronic obstructive pulmonary disease) ([place]), Diabetes mellitus without complication ([place]), Gangrene of toe of right foot ([place]), GERD (gastroesophageal reflux disease), Hyperlipidemia, and Hypertension.   reports that he has been smoking cigarettes. He has a 30.00 pack-year smoking history. He has never used smokeless tobacco.   has a past surgical history that includes Thumb surgery; Esophagogastroduodenoscopy (N/A, 03/13/2020); biopsy (03/13/2020); Flexible sigmoidoscopy (N/A, 05/06/2020); Amputation (Right, 05/17/2020); Application if wound vac (Right, 05/17/2020); Amputation (Right, 06/19/2020); Incision and drainage of wound (Right, 07/07/2020); Incision and drainage (Right, 07/13/2020); Incision and drainage abscess (N/A, 07/08/2020); Wound debridement (N/A, 07/16/2020); laparoscopy (N/A, 07/11/2020); and Incision and drainage abscess (N/A, 07/18/2020).   Significant Hospital Events   07/05/2020 - [place] admit 07/14/20  - transfer to [facility] 8/25 - s. I and D and debridement of Rt Gluteal Nec Fasc by [facility] and ->   Inspection of wound, debridement of right testicle/cord, scrotal wound edge ( excised tissue approximately 2 x 4 cm) by Urology. On neo gtt, diprivan gtt, fent gtt . On vent 40% . Not on TF (yet). Ortho Dr Sharol Given recmmended -> packing the  R AKA wound daily . Seen by Dr Gloriann Loan of Urology - currently R testicle exposed but viable tissue  8/26 = PRBC < 7 and 1 u prbc ordered. On vent. BAck to OR today, . Exposure to sister with  covid + . Sister Inez Catalina the DPOA reported that she is covid positive 8/.25/2 but she has not been at bedside. Later the visitor - sster. [name] is also covid positive. APteitn repeat covid - negative  8/27 - Followed commands on [facility] 8/27 am Remains on fent gtt at 125 mcg, diprivan gtt at 30 mcg and neo gtt at 75 mcg via PICC  line.  T max 101 WBC 16 HGB 7.6 Platelets 170,000 Lactate 1.3 Potassium 3.9 Mag 2 + 16 L since admission    8/28 -  +18L since admit. Looking more voume overloaded. Increased fio2 50% on vent. CXR wet. Unable to wean. On fent gtt, diprivan gtt, neo gtt and LR at 100cc/h  9/1 Sedate -Attempts to open his eyes to voice  9/2-noted not to be as responsive, blood gases obtained on ventilator adjusted, with no improvement in status a CT scan of the head was obtained showing evidence of CVA This was followed with an MRI showing multiple infarcts  9/4 No overnight events, did reach out to next of kin on 9/3-unable to make any goals of care changes  9/5 discussion with next of kin documented, continue current lines of care, not able to make decisions regarding goals of care  Consults:  8/24 - pccm  Procedures:  8/23 -  On 07/10/2020 Hervey Ard excisional debridement of right buttock, right inguinal region, and right scrotal skin measuring (right buttock / right inguinal wound 20cmX10cmX4cm and right scrotal skin 15cmX8cmX2cm); Drainage and packing of Right AKA wound infection ETT 8/23  RUE picc 8/24  Significant Diagnostic Tests:  cxr with basal atelectasis, left effusion 9/2-reviewed by myself  Micro Data:   Microbiology results: 8/22 BCx:  8/22 UCx:   8/22 Wound cx 8/23 - wound - multiple org. None predominant 8/23 wound inguinal -  KLEBSIELLA OXYTOCA     Organism ID, Bacteria STREPTOCOCCUS CONSTELLATUS    xxx 8/26 covid pcr - neg Covid negative  Blood culture 9/4-no growth so far  Antimicrobials:  Antimicrobials this admission: Cipro IV 8/22 >>8/22 Clinda 8/22 >8/22, 8/23 > 8/28 Vanc 8/22 >8/22, 8/22 >8/27, 8/27 -8/30 Cefepime 8/22 >8/26, 8/26 > 8/27 Clinda 8/30 preop Gentamicon 8/30 preop Flagyl 3 dose 8/30 preop Neomycin oral 8/29 3 doses preop Eraxis 9/1-9/2   linezolid 8/31>> Diflucan 9/2>> Rocephin 8/27>> Flagyl 9/2>> Interim history/subjective:    8/29 - stil volume overloaded at +19.9L since admit. 40% fio2, Pn fent gtt. Off diprivan and neo. On TF.  CCS plans for 8/30 -> diverting loop colostomy for fecal diversion given massive soft tissue infection and necrosis limited mobility with amputation 8/30-no overnight events, off pressors 9/1-no overnight events 9/2 less responsive today 9/3-new CVA-being evaluated 9/4 extensive CVA, patient unresponsive  Still with fevers, T-max 103 on 9/4  Objective   Blood pressure (!) 110/44, pulse 100, temperature 99.7 F (37.6 C), temperature source Axillary, resp. rate (!) 24, height 6\' 2"  (1.88 m), weight 76.4 kg, SpO2 96 %. CVP:  [3 mmHg-4 mmHg] 4 mmHg  Vent Mode: PSV;CPAP FiO2 (%):  [30 %] 30 % Set Rate:  [20 bmp] 20 bmp Vt Set:  [650 mL] 650 mL PEEP:  [5 cmH20] 5 cmH20 Pressure Support:  [10 cmH20] 10 cmH20 Plateau Pressure:  [11 cmH20-19 cmH20] 15 cmH20   Intake/Output Summary (Last 24 hours) at 07/26/2020 0852 Last data filed at 07/26/2020 0700 Gross per 24 hour  Intake 3462.06 ml  Output 4050 ml  Net -587.94 ml   [employer]  07/23/20 0403 07/24/20 0500 07/25/20 0500  Weight: 88.6 kg 78.9 kg 76.4 kg   General Appearance: Unresponsive, pupils reactive, cough reflex present, breathing above vent Head: Normocephalic, atraumatic Eyes: Pupils 3 mm Throat:   endotracheal tube in place Neck: Supple, no JVD Lungs: Clear air entry bilaterally anteriorly Heart: S1-S2 appreciated  Abdomen: Soft, no organomegaly Skin: Warm and dry  Resolved Hospital Problem list   Circulatory shock 8/28  Assessment & Plan:   Cerebrovascular accident -Likely secondary to MRSA bacteremia -Watershed infarcts -Comatose  Acute hypoxic/hypercapnic respiratory failure -Continue ventilator support -VAP bundle in place  Acute kidney injury Electrolyte derangement -Hypernatremia-free water, D5 water -Hypokalemia-repleting -Labs every 4 at present  Fournier's gangrene Post  debridement Post diverting colostomy -Continue antibiotics at present-linezolid, ceftriaxone, fluconazole, Flagyl  Sepsis -Likely secondary from his extensive wounds -Continue antibiotics -Follow cultures -Pressors as needed to keep MAP above 65  Chronic obstructive pulmonary disease -Bronchodilators  Hypertension -Hold antihypertensives at present -Patient did become hypotensive during the night and not to be started on pressors -Currently on Levophed  Anemia -Transfuse per protocol  Pressure injury Stage I pressure injury on back, not present on admission  -management per wound care RN   Multiple ongoing issues Appreciate neurology involvement, patient with watershed strokes  Surgery continues to follow-extensive wounds from Fournier's gangrene-unfortunately worsening despite aggressive measures  I have had multiple conversations with patient's sister regarding his prognosis, she remains unable to make a decision regarding goals of care We will continue lines of care at present   Best practice:  Diet: Tube feeds Pain/Anxiety/Delirium protocol (if indicated): sedation protocol VAP protocol (if indicated): Bundle in place DVT prophylaxis: Lovenox/Heparin GI prophylaxis: ppi Glucose control: ssi Mobility: bed rest Code Status: full code  Family Communication: Sister Cypress Fanfan main decision makder  [phone] -last on 9 12/2019  updated 07/18/20  Over phone. - she had questions about why being asked to set up password as new process. Offered to refer to office of patient experience. However, she wanted bedside RN to give her  A call  Disposition: [facility]  The patient is critically ill with multiple organ systems failure and requires high complexity decision making for assessment and support, frequent evaluation and titration of therapies, application of advanced monitoring technologies and extensive interpretation of multiple databases. Critical Care Time  devoted to patient care services described in this note independent of APP/resident time (if applicable)  is 40 minutes.   Sherrilyn Rist MD [facility] Personal pager: [phone] If unanswered, please page CCM On-call: [phone]

## 2020-07-27 ENCOUNTER — Inpatient Hospital Stay (HOSPITAL_COMMUNITY): Payer: Medicare Other

## 2020-07-27 LAB — GLUCOSE, CAPILLARY
Glucose-Capillary: 105 mg/dL — ABNORMAL HIGH (ref 70–99)
Glucose-Capillary: 67 mg/dL — ABNORMAL LOW (ref 70–99)

## 2020-07-27 MED ORDER — FENTANYL CITRATE (PF) 100 MCG/2ML IJ SOLN
25.0000 ug | INTRAMUSCULAR | Status: DC | PRN
  Administered 2020-07-27: 50 ug via INTRAVENOUS
  Filled 2020-07-27: qty 2

## 2020-07-30 LAB — CULTURE, BLOOD (ROUTINE X 2)
Culture: NO GROWTH
Culture: NO GROWTH
Special Requests: ADEQUATE

## 2020-08-18 NOTE — Chronic Care Management (AMB) (Signed)
  Care Management   Outreach Note  06/16/2020 Name: Omar Todd MRN: [id number] DOB: 1951/03/23  Referred by: [name], DO Reason for referral : Chronic Care Management (Care Coordination)   An unsuccessful telephone outreach was attempted today. The patient was referred to the case management team for assistance with care management and care coordination.   Follow Up Plan: The care management team will reach out to the patient again over the next 30 days.   Chong Sicilian, BSN, RN-BC Embedded Chronic Care Manager [facility] Direct Dial: [phone]

## 2020-08-21 NOTE — Progress Notes (Signed)
Patient went asystole at 0428, rhythm came back 3 to 4 times, went into v-fib. At final asystole, two RNs verified no heart beat for one full minute. Patients next of kin notified and post-mortem checklist completed. No belongings at patient's bedside and awaiting transport to morgue.

## 2020-08-21 NOTE — Progress Notes (Signed)
Patient became increasingly unstable, current pressor maxed out around 2100. Contacted Elink, vasopressin and epi drip started. Family was contacted to assess wishes for patient, DNR orders were placed. Family called in to say goodbyes. No escalation of care at this time, awaiting decision from provider about status of comfort care.

## 2020-08-21 NOTE — Progress Notes (Signed)
[facility] Progress Note Patient Name: Omar Todd DOB: 01-19-1951 MRN: [id number]   Date of Service  07/28/20  HPI/Events of Note  Patient with a complicated medical and surgical history, in multi-factorial shock resistant to vasopressors, also metabolic acidosis. Resuscitation is ongoing.  eICU Interventions  I spoke with patient's next of kin [name], regarding the gravity of his illness and the likely poor prognosis, after which , in consultation with her sister,  They made patient a DNR.        Fizza Scales U Maloni Musleh Jul 28, 2020, 1:19 AM

## 2020-08-21 NOTE — Death Summary Note (Signed)
DEATH SUMMARY   Patient Details  Name: Omar Todd MRN: [id number] DOB: 27-Mar-1951  Admission/Discharge Information   Admit Date:  2020-08-09  Date of Death: Date of Death: 08-24-2020  Time of Death: Time of Death: 0442  Length of Stay: 03/03/2023  Referring Physician: [name], DO   Reason(s) for Hospitalization  Patient presented to the hospital with fever, pressure ulcer around sacrum, right buttock decubitus ulcer, concern for Fournier's gangrene  Diagnoses  Preliminary cause of death: Sepsis Secondary Diagnoses (including complications and co-morbidities):  Principal Problem:   Sepsis ([place]) Active Problems:   Diabetic neuropathy, type II diabetes mellitus ([place])   Hypertension associated with diabetes ([place])   DM (diabetes mellitus), type 2, uncontrolled ([place])   BPH (benign prostatic hyperplasia)   Right ischial pressure sore, unstageable ([place])   Right above-knee amputee ([place])   Necrotizing fasciitis ([place])   Fournier's gangrene of scrotum   Surgical wound infection   Acute respiratory failure with hypoxia ([place])   Dehiscence of amputation stump ([place]) Chronic obstructive pulmonary disease Cerebrovascular accident Acute kidney injury   Brief Hospital Course (including significant findings, care, treatment, and services provided and events leading to death)  Omar Todd is a 69 y.o. year old male who was admitted to [facility] on 2023/08/10 with symptoms consistent with sepsis from pressure ulcer on his sacrum, right buttock decubitus ulcer with concern for Fournier's gangrene. He had had a recent hospitalization for right BKA stump infection.  Above-knee amputation was performed on 06/19/2020 with plans to send the patient to a skilled nursing facility, patient declined and went home.  Was evaluated by home health nurse who referred him to the hospital for evaluation for concern for sepsis. On 07/02/2020, patient had sharp excisional debridement of right buttock,  right inguinal region and right scrotal skin-impression at the time was a necrotizing infection/Fournier's gangrene of right hemiscrotum, right buttock, right inguinal region and right scrotum.  Right above-knee amputation incision infection. Patient was transferred to [facility] long hospital on 07/14/2020 for further management. 8/25 2021-incision and drainage and debridement of right gluteal necrotizing fasciitis by surgery.  Debridement of right testicle/cord, scrotal wound edge excision by urology. Patient continued to be followed by surgery with regular wound dressings. Unable to wean from ventilator 07/07/2020 laparoscopic diverting colostomy and sacral wound debridement 9/1 still unable to wean from ventilator, attempts to open his eyes to noise, continuing wound dressings 9/2 noted to be less responsive with a CT scan of the head ordered-CT scan of the head did reveal evidence of CVA 9/3 multiple discussions with the the patient's next of kin regarding goals of care. Aggressive measures continued with escalation of support. 9/5 ST-T wave changes noted on EKG, elevated cardiac markers suggestive of a myocardial infarction 2023/08/25 patient continued to deteriorate with escalating needs for pressors 08-25-23 DO NOT RESUSCITATE status Patient continued to deteriorate and finally succumbed to his illness at 0428 on 2020/08/24 Pertinent Labs and Studies  Significant Diagnostic Studies DG Sacrum/Coccyx  Result Date: 08-09-20 CLINICAL DATA:  Decubitus ulcer.  Evaluate for osteomyelitis. EXAM: SACRUM AND COCCYX - 2+ VIEW COMPARISON:  None. FINDINGS: No acute bony abnormality identified. No radiographic evidence of osteomyelitis identified. Gas in the soft tissues is noted overlying the LOWER RIGHT pelvis on the frontal view. IMPRESSION: 1. No acute bony abnormality. No definite evidence of acute osteomyelitis. 2. Gas in the soft tissues overlying the LOWER RIGHT pelvis which may represent this patient's decubitus  ulcer but correlate clinically. Electronically  Signed   By: Margarette Canada M.D.   On: 07/09/2020 20:19   DG Abd 1 View  Result Date: 07/17/2020 CLINICAL DATA:  Initial evaluation for NG tube placement. EXAM: ABDOMEN - 1 VIEW COMPARISON:  None. FINDINGS: Enteric tube in place with tip overlying the stomach, side hole beyond the GE junction. Visualized bowel gas pattern is nonobstructive. Prominent vascular calcifications noted within the infrarenal aorta. Veiling opacity overlying left hemidiaphragm suggestive of small left pleural effusion. IMPRESSION: 1. Enteric tube in place with tip overlying the stomach, side hole beyond the GE junction. 2. Small left pleural effusion. 3.  Aortic Atherosclerosis (ICD10-I70.0). Electronically Signed   By: Jeannine Boga M.D.   On: 07/17/2020 01:41   CT HEAD WO CONTRAST  Result Date: 07/23/2020 CLINICAL DATA:  Mental status change of unknown cause. EXAM: CT HEAD WITHOUT CONTRAST TECHNIQUE: Contiguous axial images were obtained from the base of the skull through the vertex without intravenous contrast. COMPARISON:  None. FINDINGS: Brain: Wedge-shaped area hypoattenuation is noted in the posterior right parietal lobe bordering on the right occipital lobe, involving the gray-white junction, consistent with a recent infarct. Smaller areas of more well-defined hypoattenuation white matter, which are likely due to chronic lacunar infarcts. The ventricles are normal in configuration. Mild ventricular and sulcal enlargement reflecting age-appropriate volume loss. Bilateral periventricular white matter hypoattenuation is present consistent with mild chronic microvascular ischemic change. There are no parenchymal masses or significant mass effect. No midline shift. There are no extra-axial masses or abnormal fluid collections. There is no intracranial hemorrhage. Vascular: No hyperdense vessel or unexpected calcification. Skull: Normal. Negative for fracture or focal lesion.  Sinuses/Orbits: Globes and orbits are unremarkable. Small amount of dependent fluid in the right maxillary sinus. Mild sphenoid sinus mucosal thickening. Fluid attenuation noted mastoid air cells partly filling the middle ear cavities in this intubated patient. Other: None. IMPRESSION: 1. Recent infarct of the posterior right parietal lobe bordering on the right occipital lobe, approximately 3 cm in greatest dimension. This appears subacute. 2. No other convincing recent infarcts. Probable all white matter lacunar infarcts and mild chronic microvascular ischemic change. 3. No intracranial hemorrhage.  No hydrocephalus. Electronically Signed   By: Lajean Manes M.D.   On: 07/23/2020 18:13   CT PELVIS W CONTRAST  Result Date: 07/06/2020 CLINICAL DATA:  Sacral ulcer EXAM: CT PELVIS WITH CONTRAST TECHNIQUE: Multidetector CT imaging of the pelvis was performed using the standard protocol following the bolus administration of intravenous contrast. CONTRAST:  158mL OMNIPAQUE IOHEXOL 300 MG/ML  SOLN COMPARISON:  None. FINDINGS: Urinary Tract:  No abnormality visualized. Bowel:  Unremarkable visualized pelvic bowel loops. 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 Large amount of calcific atherosclerosis. Reproductive:  No mass or other significant abnormality Other: There is a right sacro-ischial decubitus ulcer with gas dissecting along the right perineal soft tissues into the right retroperitoneum and right inguinal canal. No fluid collection. Musculoskeletal: No suspicious bone lesions identified. IMPRESSION: 1. Right sacro-ischial decubitus ulcer with gas dissecting along the right perineal soft tissues into the right retroperitoneum and right inguinal canal. No fluid collection. 2. 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 Recommend follow-up every 6 months and vascular consultation. This recommendation  follows ACR consensus guidelines: White Paper of the ACR Incidental Findings Committee II on Vascular Findings. J Am Coll Radiol 2013; 85:277-824. 3. Aortic Atherosclerosis (ICD10-I70.0). Electronically Signed   By: Ulyses Jarred M.D.   On: 07/18/2020 23:22  MR ANGIO HEAD WO CONTRAST  Result Date: 07/24/2020 CLINICAL DATA:  Follow-up stroke. Cerebellar and occipital infarctions. Cerebral hemispheric watershed infarctions. EXAM: MRA NECK WITHOUT  CONTRAST MRA HEAD WITHOUT CONTRAST TECHNIQUE: Multiplanar and multiecho pulse sequences of the neck were obtained without intravenous contrast. Angiographic images of the neck were obtained using MRA technique without intravenous contast.; Angiographic images of the Circle of Willis were obtained using MRA technique without intravenous contrast. COMPARISON:  MRI 07/23/2020 FINDINGS: MRA NECK FINDINGS Both common carotid arteries show antegrade flow to the bifurcation regions. Mild atherosclerotic plaque at both carotid bifurcations. No measurable stenosis of the ICA on the right. 30% narrowing of the proximal ICA bulb on the left. Both vertebral arteries show antegrade flow, with the right being dominant. MRA HEAD FINDINGS Both internal carotid arteries are widely patent through the skull base and siphon regions. The anterior and middle cerebral vessels are patent without proximal stenosis, aneurysm or vascular malformation. No large or medium vessel occlusion. Distal branch vessels show some atherosclerotic irregularity. Dominant right vertebral artery widely patent to the basilar. Non dominant left vertebral artery terminates in PICA, except for a tiny contribution to the basilar. No basilar stenosis. Posterior circulation branch vessels are normal. Left PCA takes fetal origin from the anterior circulation. Flow is visible in both posteroinferior cerebellar arteries, both superior cerebellar arteries and both posterior cerebral arteries. IMPRESSION: 1. Mild  atherosclerotic plaque at both carotid bifurcations. 30% narrowing of the proximal ICA bulb on the left. No measurable stenosis of the right ICA. 2. No intracranial large or medium vessel occlusion or correctable proximal stenosis. Distal vessel atherosclerotic irregularity. Electronically Signed   By: Nelson Chimes M.D.   On: 07/24/2020 12:34   MR ANGIO NECK WO CONTRAST  Result Date: 07/24/2020 CLINICAL DATA:  Follow-up stroke. Cerebellar and occipital infarctions. Cerebral hemispheric watershed infarctions. EXAM: MRA NECK WITHOUT  CONTRAST MRA HEAD WITHOUT CONTRAST TECHNIQUE: Multiplanar and multiecho pulse sequences of the neck were obtained without intravenous contrast. Angiographic images of the neck were obtained using MRA technique without intravenous contast.; Angiographic images of the Circle of Willis were obtained using MRA technique without intravenous contrast. COMPARISON:  MRI 07/23/2020 FINDINGS: MRA NECK FINDINGS Both common carotid arteries show antegrade flow to the bifurcation regions. Mild atherosclerotic plaque at both carotid bifurcations. No measurable stenosis of the ICA on the right. 30% narrowing of the proximal ICA bulb on the left. Both vertebral arteries show antegrade flow, with the right being dominant. MRA HEAD FINDINGS Both internal carotid arteries are widely patent through the skull base and siphon regions. The anterior and middle cerebral vessels are patent without proximal stenosis, aneurysm or vascular malformation. No large or medium vessel occlusion. Distal branch vessels show some atherosclerotic irregularity. Dominant right vertebral artery widely patent to the basilar. Non dominant left vertebral artery terminates in PICA, except for a tiny contribution to the basilar. No basilar stenosis. Posterior circulation branch vessels are normal. Left PCA takes fetal origin from the anterior circulation. Flow is visible in both posteroinferior cerebellar arteries, both superior  cerebellar arteries and both posterior cerebral arteries. IMPRESSION: 1. Mild atherosclerotic plaque at both carotid bifurcations. 30% narrowing of the proximal ICA bulb on the left. No measurable stenosis of the right ICA. 2. No intracranial large or medium vessel occlusion or correctable proximal stenosis. Distal vessel atherosclerotic irregularity. Electronically Signed   By: Nelson Chimes M.D.   On: 07/24/2020 12:34   MR BRAIN W WO CONTRAST  Result Date: 07/23/2020 CLINICAL DATA:  Stroke follow-up EXAM: MRI HEAD WITHOUT AND WITH CONTRAST TECHNIQUE: Multiplanar, multiecho pulse sequences of the brain and surrounding structures were obtained without and with intravenous contrast. CONTRAST:  52mL GADAVIST GADOBUTROL 1 MMOL/ML IV SOLN COMPARISON:  None. FINDINGS: Brain: There are bilateral occipital lobe infarcts and numerous infarcts within the deep watershed zones. There also numerous bilateral cerebellar infarcts. Early confluent hyperintense T2-weighted signal of the periventricular and deep white matter. There is generalized atrophy without lobar predilection. No chronic microhemorrhage. Normal midline structures. There are areas of post-ischemic leptomeningeal contrast enhancement within the watershed zones. Vascular: Normal flow voids. Skull and upper cervical spine: Normal marrow signal. Sinuses/Orbits: Negative. Other: None. IMPRESSION: 1. Bilateral occipital lobe, deep watershed zone and cerebellar infarcts. No acute hemorrhage or mass effect. 2. Generalized atrophy and chronic microvascular ischemia. Electronically Signed   By: Ulyses Jarred M.D.   On: 07/23/2020 22:57   DG Chest Port 1 View  Result Date: 07/23/2020 CLINICAL DATA:  Respiratory failure EXAM: PORTABLE CHEST 1 VIEW.  Patient is rotated. COMPARISON:  Chest x-ray 06/30/2020, chest x-ray 07/19/2020 FINDINGS: Endotracheal tube in similar position with tip terminating approximately 6 cm above the carina. Enteric tube again noted coursing  below the diaphragm with tip and inside port collimated off view. Right peripherally inserted central venous catheter again noted with tip overlying the expected region of the superior cavoatrial junction. The heart size and mediastinal contours are within normal limits. Redemonstration of aortic arch calcifications. No focal consolidation or pulmonary edema. Persistent at least small volume left pleural effusion. Interval improvement of possible residual trace right pleural effusion. No pneumothorax. The visualized skeletal structures are unremarkable. IMPRESSION: Persistent small volume left pleural effusion. Interval improvement of possible residual trace right pleural effusion. Otherwise no acute cardiopulmonary abnormality. Lines and tubes in similar position. Electronically Signed   By: Iven Finn M.D.   On: 07/23/2020 09:31   DG CHEST PORT 1 VIEW  Result Date: 07/01/2020 CLINICAL DATA:  Ventilator dependence. EXAM: PORTABLE CHEST 1 VIEW COMPARISON:  Multiple recent previous exams. FINDINGS: 0449 hours. Endotracheal tube tip is 5.2 cm above the base of the carina. The NG tube passes into the stomach although the distal tip position is not included on the film. Right PICC line tip overlies distal SVC. Low lung volumes with stable asymmetric elevation right hemidiaphragm. Bibasilar collapse/consolidative opacity with probable bilateral pleural effusions. Vascular congestion again noted. The visualized bony structures of the thorax show now acute abnormality. Telemetry leads overlie the chest. IMPRESSION: No substantial interval change in exam. Bibasilar collapse/consolidative opacity with bilateral pleural effusions. Cardiomegaly with vascular congestion. Electronically Signed   By: Misty Stanley M.D.   On: 07/06/2020 07:41   DG CHEST PORT 1 VIEW  Result Date: 07/19/2020 CLINICAL DATA:  ETT EXAM: PORTABLE CHEST 1 VIEW COMPARISON:  July 18, 2020 FINDINGS: The ETT is in good position. A right PICC  line is poorly evaluated due to patient positioning and poor penetration. The NG tube terminates below today's film. Diffuse interstitial opacities are stable to mildly more prominent. More focal opacity in left retrocardiac region identified. Probable layering effusion on the left. No pneumothorax. IMPRESSION: 1. Support apparatus as above. 2. Interstitial opacities in the lungs likely represent pulmonary edema, mildly more prominent the interval. 3. Opacity in left retrocardiac region may represent atelectasis given the apparent layering effusions seen on today's study and the previous. Electronically Signed   By: Dorise Bullion III M.D   On: 07/19/2020 08:20   DG CHEST PORT  1 VIEW  Result Date: 07/18/2020 CLINICAL DATA:  Respiratory failure EXAM: PORTABLE CHEST 1 VIEW COMPARISON:  July 17, 2020 FINDINGS: The ETT and right PICC line are stable. The NG tube terminates below today's film. No pneumothorax. Bilateral layering effusions with associated underlying opacities are similar in the interval. The infiltrate may be mildly worse in the right base in the interval. No pneumothorax. No change in the cardiomediastinal silhouette with persistent cardiomegaly. IMPRESSION: 1. Support apparatus as above. 2. Bibasilar infiltrates/edema with probable associated effusions are similar in the interval. The infiltrate may be slightly worsened on the right in the interval. Electronically Signed   By: Dorise Bullion III M.D   On: 07/18/2020 09:49   DG CHEST PORT 1 VIEW  Result Date: 07/17/2020 CLINICAL DATA:  Intubation. EXAM: PORTABLE CHEST 1 VIEW COMPARISON:  07/13/2020. FINDINGS: Endotracheal tube, NG tube, right PICC line stable position. Stable cardiomegaly. Unchanged bibasilar infiltrates/edema. Persistent left pleural effusion. Right pleural effusion noted on today's exam. No pneumothorax. Carotid vascular calcification. IMPRESSION: 1.  Lines and tubes in stable position. 2.  Stable cardiomegaly. 3. Unchanged  bibasilar infiltrates/edema. Persistent left pleural effusion. Right pleural effusion noted on today's exam. Electronically Signed   By: Marcello Moores  Register   On: 07/17/2020 06:41   DG CHEST PORT 1 VIEW  Result Date: 06/29/2020 CLINICAL DATA:  Respiratory failure. EXAM: PORTABLE CHEST 1 VIEW COMPARISON:  06/29/2020. FINDINGS: Endotracheal tube, NG tube, right PICC line stable position. Stable cardiomegaly. Unchanged bibasilar infiltrates/edema. Unchanged small left pleural effusion. No pneumothorax. IMPRESSION: 1.  Lines and tubes in stable position. 2.  Stable cardiomegaly. 3. Unchanged bibasilar infiltrates/edema. Unchanged small left pleural effusion. Electronically Signed   By: Marcello Moores  Register   On: 07/17/2020 06:21   DG CHEST PORT 1 VIEW  Result Date: 06/24/2020 CLINICAL DATA:  Respiratory failure. EXAM: PORTABLE CHEST 1 VIEW COMPARISON:  July 14, 2020. FINDINGS: Stable cardiomediastinal silhouette. Endotracheal and nasogastric tubes are unchanged in position. No pneumothorax is noted. Right-sided PICC line is unchanged. Stable bibasilar opacities are noted concerning for atelectasis or infiltrates, left greater than right. Small left pleural effusion may be present. Bony thorax is unremarkable. IMPRESSION: Stable support apparatus. Stable bibasilar opacities are noted concerning for atelectasis or infiltrates, left greater than right. Electronically Signed   By: Marijo Conception M.D.   On: 07/06/2020 08:09   DG CHEST PORT 1 VIEW  Result Date: 07/14/2020 CLINICAL DATA:  69 year old male with respiratory failure. Decubitus wound, sepsis. EXAM: PORTABLE CHEST 1 VIEW COMPARISON:  Portable chest 06/28/2020 and earlier. FINDINGS: Portable AP semi upright view at 1418 hours. Endotracheal tube tip in good position at the clavicles. Enteric tube courses to the stomach, side hole the level of the gastric fundus. Right upper extremity PICC line has been placed, tip is at the cavoatrial junction level.  Mediastinal contours remain within normal limits. There is increased confluent retrocardiac opacity on the left suggesting lower lobe collapse or consolidation. No superimposed pneumothorax, pulmonary edema, or definite effusion. Allowing for portable technique the right lung remains clear. Visible bowel-gas pattern within normal limits. IMPRESSION: 1. New left lower lobe collapse or consolidation since yesterday. 2. Right upper extremity PICC line placed, tip at the cavoatrial junction level. 3. Otherwise stable lines and tubes. Electronically Signed   By: Genevie Ann M.D.   On: 07/14/2020 14:56   DG CHEST PORT 1 VIEW  Result Date: 07/06/2020 CLINICAL DATA:  Respiratory failure EXAM: PORTABLE CHEST 1 VIEW COMPARISON:  07/14/2020 FINDINGS: Endotracheal tube and  gastric catheter are now seen in satisfactory position. Cardiac shadow is stable. Lungs are well aerated bilaterally. No focal infiltrate or sizable effusion is seen. No pneumothorax is noted. No bony abnormality is noted. IMPRESSION: Interval intubation.  No acute abnormality is noted. Electronically Signed   By: Inez Catalina M.D.   On: 06/23/2020 17:24   DG Chest Port 1 View  Result Date: 06/22/2020 CLINICAL DATA:  Fever and sacral ulcer. EXAM: PORTABLE CHEST 1 VIEW COMPARISON:  The May 15, 2020 FINDINGS: There is no evidence of acute infiltrate, pleural effusion or pneumothorax. The heart size and mediastinal contours are within normal limits. There is mild calcification of the aortic arch and tortuosity of the descending thoracic aorta. The visualized skeletal structures are unremarkable. IMPRESSION: No active disease. Electronically Signed   By: Virgina Norfolk M.D.   On: 06/27/2020 18:37   EEG adult  Result Date: 07/24/2020 Lora Havens, MD     07/24/2020  5:21 PM Patient Name: DAMIER DISANO MRN: [id number] Epilepsy Attending: Lora Havens Referring Physician/Provider: Dr Roland Rack Date: 07/24/2020 Duration: 23.54 mins Patient  history: 69yo M with ams. EEG to evaluate for seizure. Level of alertness: comatose AEDs during EEG study: None Technical aspects: This EEG study was done with scalp electrodes positioned according to the 10-20 International system of electrode placement. Electrical activity was acquired at a sampling rate of 500Hz  and reviewed with a high frequency filter of 70Hz  and a low frequency filter of 1Hz . EEG data were recorded continuously and digitally stored. Description: EEG showed continuous generalized 3 to 6 Hz theta-delta slowing as well as triphasic waves, generalized.   Hyperventilation and photic stimulation were not performed.   ABNORMALITY -Continuous slow, generalized -Triphasic waves, generalized IMPRESSION: This study is suggestive of severe encephalopathy, nonspecific etiology but could be related to toxic-metabolic etiology. No seizures or epileptiform discharges were seen throughout the recording. Lora Havens   ECHOCARDIOGRAM COMPLETE  Result Date: 07/18/2020    ECHOCARDIOGRAM REPORT   Patient Name:   JERIMIE MANCUSO Date of Exam: 07/18/2020 Medical Rec #:  [id number]      Height:       74.0 in Accession #:    [id number]     Weight:       202.6 lb Date of Birth:  1951-07-14     BSA:          2.185 m Patient Age:    41 years       BP:           145/57 mmHg Patient Gender: M              HR:           111 bpm. Exam Location:  Inpatient Procedure: 2D Echo, Color Doppler, Cardiac Doppler, Intracardiac Opacification            Agent and 3D Echo Indications:    Acute Respiratory Insufficiency R06.89  History:        Patient has prior history of Echocardiogram examinations, most                 recent 03/13/2020. COPD; Risk Factors:Hypertension, Diabetes and                 Dyslipidemia.  Sonographer:    Raquel Sarna Senior RDCS Referring Phys: [place]  Sonographer Comments: Scanned supine on artificial respirator. COVID Pending at time of study. IMPRESSIONS  1. Left ventricular ejection fraction,  by estimation, is  approximately 35%. The left ventricle has moderately decreased function. The left ventricle demonstrates regional wall motion abnormalities (see scoring diagram/findings for description). Left ventricular diastolic parameters are consistent with Grade I diastolic dysfunction (impaired relaxation).  2. Right ventricular systolic function is normal. The right ventricular size is normal. Tricuspid regurgitation signal is inadequate for assessing PA pressure.  3. The mitral valve is grossly normal, mildly thickened and calcified. Mild mitral valve regurgitation.  4. The aortic valve is probably tricuspid based on limited views with moderate calcification. Aortic valve regurgitation is mild. Moderate to severe aortic valve stenosis. Aortic valve mean gradient measures 37.0 mmHg. Aortic valve Vmax measures 4.07 m/s.  5. Aortic dilatation noted. There is mild dilatation of the ascending aorta.  6. The inferior vena cava is normal in size with <50% respiratory variability, suggesting right atrial pressure of 8 mmHg. FINDINGS  Left Ventricle: Left ventricular ejection fraction, by estimation, is 35%. The left ventricle has moderately decreased function. The left ventricle demonstrates regional wall motion abnormalities. Definity contrast agent was given IV to delineate the left ventricular endocardial borders. The left ventricular internal cavity size was normal in size. There is borderline left ventricular hypertrophy. Left ventricular diastolic parameters are consistent with Grade I diastolic dysfunction (impaired relaxation).  LV Wall Scoring: The mid and distal anterior septum, mid and distal inferior wall, mid inferolateral segment, mid anterolateral segment, and mid inferoseptal segment are hypokinetic. The entire anterior wall, basal anteroseptal segment, basal inferolateral segment, apical lateral segment, basal anterolateral segment, basal inferior segment, basal inferoseptal segment, and apex are  normal. Right Ventricle: The right ventricular size is normal. No increase in right ventricular wall thickness. Right ventricular systolic function is normal. Tricuspid regurgitation signal is inadequate for assessing PA pressure. Left Atrium: Left atrial size was normal in size. Right Atrium: Right atrial size was normal in size. Pericardium: There is no evidence of pericardial effusion. Mitral Valve: The mitral valve is grossly normal. There is mild thickening of the mitral valve leaflet(s). There is mild calcification of the mitral valve leaflet(s). Mild mitral valve regurgitation. Tricuspid Valve: The tricuspid valve is grossly normal. Tricuspid valve regurgitation is trivial. Aortic Valve: The aortic valve is tricuspid. Aortic valve regurgitation is mild. Aortic regurgitation PHT measures 302 msec. Moderate to severe aortic stenosis is present. There is moderate calcification of the aortic valve. Aortic valve mean gradient measures 37.0 mmHg. Aortic valve peak gradient measures 66.3 mmHg. Aortic valve area, by VTI measures 1.05 cm. Pulmonic Valve: The pulmonic valve was not well visualized. Pulmonic valve regurgitation is trivial. Aorta: Aortic dilatation noted. There is mild dilatation of the ascending aorta. Venous: The inferior vena cava is normal in size with less than 50% respiratory variability, suggesting right atrial pressure of 8 mmHg. IAS/Shunts: No atrial level shunt detected by color flow Doppler. Additional Comments: There is pleural effusion in the left lateral region.  LEFT VENTRICLE PLAX 2D LVIDd:         5.00 cm LVIDs:         3.80 cm LV PW:         0.90 cm LV IVS:        1.10 cm LVOT diam:     2.30 cm      3D Volume EF: LV SV:         82           3D EF:        48 % LV SV Index:   38  LV EDV:       191 ml LVOT Area:     4.15 cm     LV ESV:       99 ml                             LV SV:        92 ml  LV Volumes (MOD) LV vol d, MOD A2C: 135.0 ml LV vol d, MOD A4C: 146.0 ml LV vol s,  MOD A2C: 91.6 ml LV vol s, MOD A4C: 94.8 ml LV SV MOD A2C:     43.4 ml LV SV MOD A4C:     146.0 ml LV SV MOD BP:      48.9 ml RIGHT VENTRICLE RV S prime:     16.80 cm/s TAPSE (M-mode): 2.7 cm LEFT ATRIUM             Index       RIGHT ATRIUM           Index LA diam:        4.00 cm 1.83 cm/m  RA Area:     12.20 cm LA Vol (A2C):   81.5 ml 37.29 ml/m RA Volume:   21.40 ml  9.79 ml/m LA Vol (A4C):   56.1 ml 25.67 ml/m LA Biplane Vol: 70.8 ml 32.40 ml/m  AORTIC VALVE AV Area (Vmax):    1.10 cm AV Area (Vmean):   1.10 cm AV Area (VTI):     1.05 cm AV Vmax:           407.00 cm/s AV Vmean:          280.000 cm/s AV VTI:            0.785 m AV Peak Grad:      66.3 mmHg AV Mean Grad:      37.0 mmHg LVOT Vmax:         107.60 cm/s LVOT Vmean:        74.200 cm/s LVOT VTI:          0.198 m LVOT/AV VTI ratio: 0.25 AI PHT:            302 msec  AORTA Ao Root diam: 3.50 cm Ao Asc diam:  4.10 cm  SHUNTS Systemic VTI:  0.20 m Systemic Diam: 2.30 cm Rozann Lesches MD Electronically signed by Rozann Lesches MD Signature Date/Time: 07/18/2020/3:35:36 PM    Final    [place] EKG SITE RITE  Result Date: 07/14/2020 If Site Rite image not attached, placement could not be confirmed due to current cardiac rhythm.  US Abdomen Limited RUQ  Result Date: 07/14/2020 CLINICAL DATA:  Elevated LFT EXAM: ULTRASOUND ABDOMEN LIMITED RIGHT UPPER QUADRANT COMPARISON:  None. FINDINGS: Gallbladder: Negative for gallstones. Mild sludge in the gallbladder. Gallbladder wall not significantly thickened. Patient is intubated and sedated therefore Murphy sign not considered accurate. Common bile duct: Diameter: 4.1 mm Liver: Increased echogenicity liver. No focal liver lesion. Portal vein is patent on color Doppler imaging with normal direction of blood flow towards the liver. Other: Limited study due to intubation. IMPRESSION: Increased echogenicity liver compatible with fatty infiltration. No liver mass. Mild gallbladder sludge.  No biliary dilatation.  Electronically Signed   By: Franchot Gallo M.D.   On: 07/14/2020 14:28    Microbiology No results found for this or any previous visit (from the past 240 hour(s)).  Lab Basic Metabolic Panel: No results for input(s): NA,  K, CL, CO2, GLUCOSE, BUN, CREATININE, CALCIUM, MG, PHOS in the last 168 hours. Liver Function Tests: No results for input(s): AST, ALT, ALKPHOS, BILITOT, PROT, ALBUMIN in the last 168 hours. No results for input(s): LIPASE, AMYLASE in the last 168 hours. No results for input(s): AMMONIA in the last 168 hours. CBC: No results for input(s): WBC, NEUTROABS, HGB, HCT, MCV, PLT in the last 168 hours. Cardiac Enzymes: No results for input(s): CKTOTAL, CKMB, CKMBINDEX, TROPONINI in the last 168 hours. Sepsis Labs: No results for input(s): PROCALCITON, WBC, LATICACIDVEN in the last 168 hours.  Procedures/Operations  8/30 diverting colostomy, sacral wound debridement 8/26 debridement of soft tissue infection 8/25 debridement of right testicle/cord, scrotal wound edge 8/23 sharp excision and debridement of buttocks, inguinal region, scrotum, incisional drainage of right AKA stump  Timothy Townsel A Gwendloyn Forsee 08/06/2020, 4:50 AM

## 2020-08-21 NOTE — Progress Notes (Signed)
[facility] Progress Note Patient Name: Omar Todd DOB: [birth year] MRN: [id number]   Date of Service  07-30-2020  HPI/Events of Note  Blood sugar 67 mg % on D 10 % water infusion  @ 75 ml / hour.  eICU Interventions  Lantus Insulin discontinued, D 10 % increased to 125 ml / hour.        [name] [name] 07/30/20, 4:22 AM

## 2020-08-21 NOTE — Progress Notes (Signed)
Patient too unstable for wound care, possible transition to comfort care on day shift.

## 2020-08-21 NOTE — Progress Notes (Deleted)
[facility] Progress Note Patient Name: Omar Todd DOB: 30-Aug-1951 MRN: [id number]   Date of Service  07-30-2020  HPI/Events of Note  Bedside RN reporting a bloody bowel movement, Hemoglobin obtained within the hour is > 9 gm.  eICU Interventions  Continue to monitor closely for hemodynamically significant bleeding, and serial H and H's.        [name] Meeghan Skipper July 30, 2020, 4:36 AM

## 2020-08-21 DEATH — deceased
